# Patient Record
Sex: Female | Born: 1945
Health system: Southern US, Community
[De-identification: ages and names within clinical notes are randomized; demographics above are authoritative.]

## PROBLEM LIST (undated history)

## (undated) DIAGNOSIS — M1990 Unspecified osteoarthritis, unspecified site: Secondary | ICD-10-CM

## (undated) DIAGNOSIS — F32A Depression, unspecified: Secondary | ICD-10-CM

## (undated) DIAGNOSIS — E785 Hyperlipidemia, unspecified: Secondary | ICD-10-CM

## (undated) DIAGNOSIS — F419 Anxiety disorder, unspecified: Secondary | ICD-10-CM

## (undated) DIAGNOSIS — D509 Iron deficiency anemia, unspecified: Secondary | ICD-10-CM

## (undated) DIAGNOSIS — F329 Major depressive disorder, single episode, unspecified: Secondary | ICD-10-CM

## (undated) DIAGNOSIS — M810 Age-related osteoporosis without current pathological fracture: Secondary | ICD-10-CM

## (undated) DIAGNOSIS — I5022 Chronic systolic (congestive) heart failure: Secondary | ICD-10-CM

## (undated) DIAGNOSIS — F410 Panic disorder [episodic paroxysmal anxiety] without agoraphobia: Secondary | ICD-10-CM

## (undated) DIAGNOSIS — I2510 Atherosclerotic heart disease of native coronary artery without angina pectoris: Secondary | ICD-10-CM

## (undated) DIAGNOSIS — I5032 Chronic diastolic (congestive) heart failure: Secondary | ICD-10-CM

## (undated) DIAGNOSIS — I639 Cerebral infarction, unspecified: Secondary | ICD-10-CM

## (undated) DIAGNOSIS — E039 Hypothyroidism, unspecified: Secondary | ICD-10-CM

## (undated) DIAGNOSIS — E118 Type 2 diabetes mellitus with unspecified complications: Secondary | ICD-10-CM

## (undated) DIAGNOSIS — K589 Irritable bowel syndrome without diarrhea: Secondary | ICD-10-CM

## (undated) DIAGNOSIS — G459 Transient cerebral ischemic attack, unspecified: Secondary | ICD-10-CM

## (undated) DIAGNOSIS — I350 Nonrheumatic aortic (valve) stenosis: Secondary | ICD-10-CM

## (undated) HISTORY — PX: CHOLECYSTECTOMY: SHX55

## (undated) HISTORY — DX: Atherosclerotic heart disease of native coronary artery without angina pectoris: I25.10

## (undated) HISTORY — PX: ABDOMINAL HYSTERECTOMY: SHX81

## (undated) HISTORY — DX: Iron deficiency anemia, unspecified: D50.9

## (undated) HISTORY — DX: Type 2 diabetes mellitus with unspecified complications: E11.8

## (undated) HISTORY — DX: Nonrheumatic aortic (valve) stenosis: I35.0

## (undated) HISTORY — DX: Hyperlipidemia, unspecified: E78.5

## (undated) HISTORY — DX: Chronic systolic (congestive) heart failure: I50.22

## (undated) HISTORY — PX: COLONOSCOPY: SHX174

## (undated) HISTORY — DX: Transient cerebral ischemic attack, unspecified: G45.9

## (undated) HISTORY — PX: FRACTURE SURGERY: SHX138

---

## 1999-07-02 DIAGNOSIS — I639 Cerebral infarction, unspecified: Secondary | ICD-10-CM

## 1999-07-02 HISTORY — DX: Cerebral infarction, unspecified: I63.9

## 2001-03-08 ENCOUNTER — Emergency Department (HOSPITAL_COMMUNITY): Admission: EM | Admit: 2001-03-08 | Discharge: 2001-03-08 | Payer: Self-pay

## 2004-02-19 ENCOUNTER — Other Ambulatory Visit: Payer: Self-pay

## 2004-02-20 ENCOUNTER — Other Ambulatory Visit: Payer: Self-pay

## 2004-06-05 ENCOUNTER — Ambulatory Visit: Payer: Self-pay | Admitting: Internal Medicine

## 2004-07-01 DIAGNOSIS — G459 Transient cerebral ischemic attack, unspecified: Secondary | ICD-10-CM

## 2004-07-01 HISTORY — DX: Transient cerebral ischemic attack, unspecified: G45.9

## 2004-09-16 ENCOUNTER — Inpatient Hospital Stay (HOSPITAL_COMMUNITY): Admission: EM | Admit: 2004-09-16 | Discharge: 2004-09-18 | Payer: Self-pay | Admitting: Emergency Medicine

## 2006-04-08 ENCOUNTER — Emergency Department: Payer: Self-pay | Admitting: Unknown Physician Specialty

## 2006-04-08 ENCOUNTER — Other Ambulatory Visit: Payer: Self-pay

## 2006-07-26 ENCOUNTER — Emergency Department: Payer: Self-pay | Admitting: Emergency Medicine

## 2006-09-26 ENCOUNTER — Other Ambulatory Visit: Payer: Self-pay

## 2006-09-26 ENCOUNTER — Inpatient Hospital Stay: Payer: Self-pay | Admitting: Internal Medicine

## 2007-08-10 ENCOUNTER — Inpatient Hospital Stay: Payer: Self-pay | Admitting: Internal Medicine

## 2007-10-30 ENCOUNTER — Ambulatory Visit: Payer: Self-pay | Admitting: Internal Medicine

## 2008-03-12 ENCOUNTER — Emergency Department: Payer: Self-pay | Admitting: Emergency Medicine

## 2008-07-20 ENCOUNTER — Emergency Department: Payer: Self-pay | Admitting: Emergency Medicine

## 2008-10-05 ENCOUNTER — Inpatient Hospital Stay: Payer: Self-pay | Admitting: Internal Medicine

## 2009-12-01 ENCOUNTER — Emergency Department: Payer: Self-pay | Admitting: Emergency Medicine

## 2010-06-06 ENCOUNTER — Ambulatory Visit: Payer: Self-pay | Admitting: Internal Medicine

## 2010-11-18 ENCOUNTER — Observation Stay: Payer: Self-pay | Admitting: Internal Medicine

## 2011-06-18 ENCOUNTER — Emergency Department: Payer: Self-pay | Admitting: Emergency Medicine

## 2012-03-01 ENCOUNTER — Inpatient Hospital Stay: Payer: Self-pay | Admitting: Student

## 2012-03-01 LAB — CBC
HCT: 39.1 % (ref 35.0–47.0)
HGB: 12.8 g/dL (ref 12.0–16.0)
MCH: 27.7 pg (ref 26.0–34.0)
MCHC: 32.7 g/dL (ref 32.0–36.0)
MCV: 85 fL (ref 80–100)
Platelet: 138 10*3/uL — ABNORMAL LOW (ref 150–440)
RBC: 4.61 10*6/uL (ref 3.80–5.20)
RDW: 15 % — ABNORMAL HIGH (ref 11.5–14.5)
WBC: 4.9 10*3/uL (ref 3.6–11.0)

## 2012-03-01 LAB — COMPREHENSIVE METABOLIC PANEL
Albumin: 3.4 g/dL (ref 3.4–5.0)
Alkaline Phosphatase: 119 U/L (ref 50–136)
Anion Gap: 5 — ABNORMAL LOW (ref 7–16)
BUN: 7 mg/dL (ref 7–18)
Bilirubin,Total: 0.2 mg/dL (ref 0.2–1.0)
Calcium, Total: 9.3 mg/dL (ref 8.5–10.1)
Chloride: 105 mmol/L (ref 98–107)
Co2: 27 mmol/L (ref 21–32)
Creatinine: 0.68 mg/dL (ref 0.60–1.30)
EGFR (African American): >60
EGFR (Non-African Amer.): >60
Glucose: 230 mg/dL — ABNORMAL HIGH (ref 65–99)
Osmolality: 279 (ref 275–301)
Potassium: 4 mmol/L (ref 3.5–5.1)
SGOT(AST): 29 U/L (ref 15–37)
SGPT (ALT): 23 U/L (ref 12–78)
Sodium: 137 mmol/L (ref 136–145)
Total Protein: 8.3 g/dL — ABNORMAL HIGH (ref 6.4–8.2)

## 2012-03-01 LAB — URINALYSIS, COMPLETE
Glucose,UR: 150 mg/dL (ref 0–75)
Ketone: NEGATIVE
Nitrite: NEGATIVE
WBC UR: 9 /HPF (ref 0–5)

## 2012-03-01 LAB — PROTIME-INR
INR: 1.1
Prothrombin Time: 14.1 secs (ref 11.5–14.7)

## 2012-03-01 LAB — TROPONIN I: Troponin-I: <0.02 ng/mL

## 2012-03-02 LAB — COMPREHENSIVE METABOLIC PANEL
Albumin: 3.2 g/dL — ABNORMAL LOW (ref 3.4–5.0)
Anion Gap: 7 (ref 7–16)
BUN: 9 mg/dL (ref 7–18)
Calcium, Total: 9.1 mg/dL (ref 8.5–10.1)
Chloride: 105 mmol/L (ref 98–107)
EGFR (African American): >60
Glucose: 130 mg/dL — ABNORMAL HIGH (ref 65–99)
Potassium: 3.6 mmol/L (ref 3.5–5.1)
SGOT(AST): 26 U/L (ref 15–37)
SGPT (ALT): 21 U/L (ref 12–78)
Total Protein: 7.7 g/dL (ref 6.4–8.2)

## 2012-03-02 LAB — CBC WITH DIFFERENTIAL/PLATELET
Basophil #: 0 10*3/uL (ref 0.0–0.1)
Basophil %: 0.8 %
Eosinophil #: 0.2 10*3/uL (ref 0.0–0.7)
HCT: 37.1 % (ref 35.0–47.0)
Lymphocyte #: 2.1 10*3/uL (ref 1.0–3.6)
MCHC: 33.4 g/dL (ref 32.0–36.0)
MCV: 85 fL (ref 80–100)
Monocyte %: 7.5 %
Neutrophil #: 2.6 10*3/uL (ref 1.4–6.5)
Neutrophil %: 49.2 %
RDW: 15 % — ABNORMAL HIGH (ref 11.5–14.5)

## 2012-03-02 LAB — LIPID PANEL
HDL Cholesterol: 47 mg/dL (ref 40–60)
Ldl Cholesterol, Calc: 46 mg/dL (ref 0–100)
VLDL Cholesterol, Calc: 39 mg/dL (ref 5–40)

## 2013-06-09 ENCOUNTER — Emergency Department: Payer: Self-pay

## 2013-06-09 LAB — CBC
HCT: 41.7 % (ref 35.0–47.0)
HGB: 13.7 g/dL (ref 12.0–16.0)
MCV: 84 fL (ref 80–100)
Platelet: 129 10*3/uL — ABNORMAL LOW (ref 150–440)
RBC: 4.96 10*6/uL (ref 3.80–5.20)
RDW: 16.4 % — ABNORMAL HIGH (ref 11.5–14.5)

## 2013-06-09 LAB — BASIC METABOLIC PANEL
Anion Gap: 7 (ref 7–16)
BUN: 5 mg/dL — ABNORMAL LOW (ref 7–18)
Calcium, Total: 9.2 mg/dL (ref 8.5–10.1)
Chloride: 102 mmol/L (ref 98–107)
Co2: 28 mmol/L (ref 21–32)
EGFR (African American): >60
Glucose: 222 mg/dL — ABNORMAL HIGH (ref 65–99)
Osmolality: 278 (ref 275–301)
Sodium: 137 mmol/L (ref 136–145)

## 2013-06-09 LAB — URINALYSIS, COMPLETE
Bilirubin,UR: NEGATIVE
Blood: NEGATIVE
Glucose,UR: 50 mg/dL (ref 0–75)
Leukocyte Esterase: NEGATIVE
Ph: 8 (ref 4.5–8.0)
Specific Gravity: 1.004 (ref 1.003–1.030)
Squamous Epithelial: 1
WBC UR: 2 /HPF (ref 0–5)

## 2013-06-09 LAB — TROPONIN I: Troponin-I: <0.02 ng/mL

## 2014-02-11 ENCOUNTER — Ambulatory Visit: Payer: Self-pay | Admitting: Internal Medicine

## 2014-10-18 NOTE — H&P (Signed)
PATIENT NAME:  Jenny [name], Jenny [name] MR#:  [id number] DATE OF BIRTH:  Aug 11, 1945  DATE OF ADMISSION:  03/01/2012  PRIMARY CARE PHYSICIAN: Corky Downs, MD  REFERRING PHYSICIAN: Glennie Isle, MD  CHIEF COMPLAINT: Right side weakness.   HISTORY OF PRESENT ILLNESS: This is a 69 year old female with significant past medical history of cerebrovascular accident x2 on aspirin 325 mg and Plavix at home, history of diabetes, hypothyroidism, hypertension, and hyperlipidemia who presents with complaints of right side weakness. The patient reports her symptoms developed around 6 o'clock p.m. where she started to notice right upper and lower extremity weakness and numbness and some mild slurred speech. The patient denies any confusion, altered mental status, chest pain, shortness of breath, palpitations, or visual changes. Upon presentation to the ED, the patient had a CT of the brain which did not show any acute abnormality but did show evidence of old CVA in the right cerebellum. The patient reports her symptoms have significantly improved and at the time of my examination, around 1040, the patient did not have symptoms and symptoms were totally resolved, speech was clear and motor strength regained back. The patient reports she did not have any recent echo or work-up for her cerebrovascular accident. The patient reports she took Plavix this morning. The patient was given 325 mg of aspirin in the ED. The patient reports she has been compliant with her medication.   PAST MEDICAL HISTORY:  1. Cardiac arrhythmia.  2. Mitral vault prolapse.  3. Anxiety/depression.  4. Type 2 diabetes, on insulin.  5. Hypothyroidism.  6. Hypertension.  7. Hyperlipidemia.  8. History of cerebrovascular accident x2.   MEDICATIONS:  1. Alprazolam 1 mg two times a day. 2. Plavix 75 mg daily. 3. Diltiazem 240 mg daily.  4. Glyburide 4 mg daily.  5. Hydroxyzine 25 mg at bedtime.  6. Januvia 100 mg daily. 7. Lantus 53 units in  the a.m., 50 units in the p.m.  8. Levothroid 88 mcg daily.  9. Metformin 500 mg two times a day.  10. Simvastatin 40 mg at bedtime. 11. Zoloft 100 mg daily.   ALLERGIES: Iodine and IV dye.   SOCIAL HISTORY: No history of alcohol or tobacco abuse.   FAMILY HISTORY: Significant for coronary artery disease, diabetes, hypertension, and CVA.   REVIEW OF SYSTEMS: CONSTITUTIONAL: The patient denies fever, fatigue, or generalized weakness. EYES: Denies blurry vision, double vision, or pain. ENT: Denies tinnitus, ear pain, or hearing loss. RESPIRATORY: Denies cough, wheezing, hemoptysis, or dyspnea. CARDIOVASCULAR: Denies chest pain, orthopnea, edema, arrhythmias, palpitations, or syncope. GASTROINTESTINAL: Denies nausea, vomiting, diarrhea, abdominal pain, or hematemesis. GENITOURINARY: Denies dysuria, hematuria, or renal colic. ENDOCRINE: Denies polyuria, polydipsia, heat or cold intolerance. Has history of hypothyroidism. HEMATOLOGY: Denies anemia, easy bruising, or bleeding diathesis. INTEGUMENTARY: Denies any acne. Has some mild rash in her left lower extremity, chronic. MUSCULOSKELETAL: Complains of right side weakness, currently resolved. Denies any history of arthritis or gout. NEURO: Has right side weakness, currently resolved, as well as numbness has resolved. Reports history of CVA. Had complaints of headache. Denies any tremors or vertigo. PSYCH: Has history of anxiety and depression. Denies any alcohol or substance abuse.   PHYSICAL EXAMINATION:   VITAL SIGNS: Temperature 97.2, pulse 71, respiratory rate 18, blood pressure 138/66, and saturating 96% on room air.   GENERAL: Well-nourished female who is comfortable in bed, in no apparent distress.   HEENT: Head atraumatic, normocephalic. Pupils are equal and reactive to light. Pink conjunctivae. Anicteric sclerae. Moist oral mucosa.  NECK: Supple. No thyromegaly. No JVD.   CHEST: Good air entry bilaterally. No wheezing, rales, or rhonchi.    CARDIOVASCULAR: S1 and S2 heard. No rubs, murmurs, or gallops.   ABDOMEN: Soft, nontender, and nondistended. Bowel sounds present.   EXTREMITIES: No edema. No clubbing. No cyanosis.  NEURO: Cranial nerves II through XII grossly intact. Motor five out of five in all extremities. Sensation symmetrical.   PSYCHIATRIC: Appropriate affect, awake and alert x3. Intact judgment and insight.   PERTINENT LABS/STUDIES: Glucose 230, BUN 7, creatinine 0.68, sodium 137, potassium 4, chloride 105, CO2 27, total protein 8.3, albumin 3.4, and total bilirubin 0.2. Troponin less than 0.02. White blood cells 4.9, hemoglobin 12.8, hematocrit 39.1, and platelets 138.   Urinalysis negative.   CT of brain shows old infarct in the right cerebellum without acute abnormalities.   ASSESSMENT AND PLAN:  1. Transient ischemic attack. The patient is already on 325 mg of aspirin and 75 mg of Plavix. She denies any history of atrial fibrillation. We will admit the patient for further work-up. We will have her on a telemonitor to monitor for any arrhythmias. We will obtain 2-D echo. We will obtain MRI of the brain with and without contrast and bilateral carotid Doppler's. The patient was given 325 mg of aspirin in the ED and 80 mg of statin and 80 mg of simvastatin. We will check lipid panel in the a.m.  2. History of CVA. We will continue the patient on aspirin, Plavix, and statin.  3. Hypertension. Will we continue the patient on Cardizem.  4. Hyperlipidemia. We will check lipid panel in the a.m. We will continue with Cardizem.  5. Diabetes mellitus. We will hold oral hypoglycemic agent. We will continue the patient on insulin sliding scale before meals and will start her at a lower dose insulin than home dose which can be increased gradually depending on her p.o. intake. The patient does not have any dysphagia and can be resumed on cardiac and diabetic diet.  6. Hypothyroidism. Continue with Synthroid.  7. Deep vein  thrombosis prophylaxis. Subcutaneous heparin.  8. GI prophylaxis. Protonix.   CODE STATUS: FULL CODE.   TOTAL TIME SPENT ON ADMISSION AND PATIENT CARE: 55 minutes.  ____________________________ Starleen Arms, MD dse:slb D: 03/01/2012 23:07:30 ET T: 03/02/2012 11:15:36 ET JOB#: [id number]  cc: Starleen Arms, MD, <Dictator> Corky Downs, MD Kendall Arnell Teena Irani MD ELECTRONICALLY SIGNED 03/03/2012 0:27

## 2014-10-18 NOTE — Discharge Summary (Signed)
PATIENT NAME:  Jenny [name], PITTINGER MR#:  [id number] DATE OF BIRTH:  22-Jun-1946  DATE OF ADMISSION:  03/01/2012 DATE OF DISCHARGE:  03/03/2012  PRIMARY CARE PHYSICIAN: Corky Downs, MD  CHIEF COMPLAINT: Right-sided weakness.   DISCHARGE DIAGNOSES:  1. Possible transient ischemic attack.  2. History of cardiac arrhythmia.  3. History of mitral valve prolapse. 4. Anxiety/depression.  5. Type 2 diabetes, on insulin.  6. Hypothyroidism.  7. Hypertension.  8. Hyperlipidemia.  9. History of cerebrovascular accident and transient ischemic attack.   DISCHARGE MEDICATIONS:  1. Metformin 500 mg 1 tab 2 times a day. 2. Levothyroxine 88 mcg daily. 3. Glimepiride 4 mg 1 tab daily.  4. Alprazolam 1 mg 2 times a day.  5. Hydroxyzine 25 mg at bedtime. 6. Zoloft 100 mg daily. 7. Januvia 100 mg daily.  8. Diltiazem 240 mg extended-release one cap daily.  9. Simvastatin 40 mg daily.  10. Plavix 75 mg daily.  11. Lantus 50 units in the a.m. and 40 units in the p.m. 12. Aspirin 325 mg 1 tab daily.   DIET: Low sodium, ADA diabetic, low-fat, low-cholesterol diet.   ACTIVITY: As tolerated.   FOLLOWUP: Follow up with her primary care physician within 1 to 2 weeks.  HISTORY OF PRESENT ILLNESS: For full details of history and physical please see dictation of March 01, 2012 by Dr. Randol Kern; but briefly this is a 69 year old female with history of CVA in the past on aspirin and Plavix, diabetes, hypothyroidism, hypertension who presented with right upper extremity weakness as well as right lower extremity weakness and some mild slurred speech. The symptoms lasted for several hours. The patient had a CT of the head in the ED which was negative for acute abnormalities but the patient was admitted to hospitalist service for further evaluation and management.   SIGNIFICANT LABS AND/IMAGING: Creatinine on arrival 0.68, sodium 137, glucose 230.  LDL within goal 46. LFTs on arrival: Total protein 8.3, otherwise  within normal limits. Troponin was negative x1. WBC 4.9, hemoglobin 12.8, hematocrit 39.1, platelets 138,000. INR 1. Urinalysis not suggestive of infection. Echocardiogram: Normal ejection fraction with mild diastolic dysfunction, mild MR and TR. CT of head without contrast showing old small stable right cerebellar infarct, no acute abnormality. MRI of the brain on March 03, 2012 showing chronic ischemia, no evidence of acute ischemia. Ultrasound of carotid showing mild atherosclerotic disease, no evidence of hemodynamically significant stenosis.   HOSPITAL COURSE: The patient was admitted to the hospitalist service. She had her aspirin and Plavix resumed. She had resolved neurological symptoms, and her symptoms did not come back. The tele did not show any significant arrhythmia. Echo was noted as above and did not have any significant valvular abnormalities, and her carotid ultrasound did not have any significant stenosis but it did show some plaque. The patient's cholesterol panel was checked and has LDL within goal and is on statin already as well as her aspirin and Plavix. An MRI of the brain did not show any acute stroke, and patient was seen by PT and the recommendation was to be discharged to home and the patient does not want to be on Coumadin at this point. Further risk modifications and the decision of whether to anticoagulate her with perhaps another agent if patient wants to be anticoagulated fully will be deferred to her primary physician upon discharge. At this point, as her symptoms have resolved, she will be discharged with outpatient followup with her primary care physician.  TOTAL TIME SPENT: 35 minutes.   CODE STATUS: PATIENT IS FULL CODE.     ____________________________ Krystal Eaton, MD sa:vtd D: 03/03/2012 16:26:47 ET T: 03/05/2012 12:17:01 ET JOB#: [id number]  cc: Krystal Eaton, MD, <Dictator> Corky Downs, MD Marcelle Smiling [facility] MD ELECTRONICALLY SIGNED  03/11/2012 0:21

## 2014-10-22 ENCOUNTER — Inpatient Hospital Stay
Admit: 2014-10-22 | Disposition: A | Discharge status: Skilled Nursing Facility | Payer: Self-pay | Attending: Orthopedic Surgery | Admitting: Orthopedic Surgery

## 2014-10-22 LAB — URINALYSIS, COMPLETE
Bilirubin,UR: NEGATIVE
Blood: NEGATIVE
Glucose,UR: 150 mg/dL (ref 0–75)
Ketone: NEGATIVE
Leukocyte Esterase: NEGATIVE
Nitrite: NEGATIVE
PROTEIN: NEGATIVE
Ph: 9 (ref 4.5–8.0)
Specific Gravity: 1.003 (ref 1.003–1.030)
Squamous Epithelial: NONE SEEN

## 2014-10-22 LAB — CBC
HCT: 39.6 % (ref 35.0–47.0)
HGB: 12.9 g/dL (ref 12.0–16.0)
MCH: 27.7 pg (ref 26.0–34.0)
MCHC: 32.6 g/dL (ref 32.0–36.0)
MCV: 85 fL (ref 80–100)
Platelet: 100 10*3/uL — ABNORMAL LOW (ref 150–440)
RBC: 4.65 10*6/uL (ref 3.80–5.20)
RDW: 16.1 % — ABNORMAL HIGH (ref 11.5–14.5)
WBC: 4.8 10*3/uL (ref 3.6–11.0)

## 2014-10-22 LAB — BASIC METABOLIC PANEL
ANION GAP: 10 (ref 7–16)
BUN: 6 mg/dL
CO2: 24 mmol/L
Calcium, Total: 8.9 mg/dL
Chloride: 100 mmol/L — ABNORMAL LOW
Creatinine: 0.68 mg/dL
EGFR (African American): >60
EGFR (Non-African Amer.): >60
Glucose: 333 mg/dL — ABNORMAL HIGH
POTASSIUM: 3.7 mmol/L
Sodium: 134 mmol/L — ABNORMAL LOW

## 2014-10-22 LAB — PROTIME-INR
INR: 1.1
INR: 1.2
Prothrombin Time: 14.7 secs
Prothrombin Time: 15.3 secs — ABNORMAL HIGH

## 2014-10-22 LAB — APTT: ACTIVATED PTT: 34.3 s (ref 23.6–35.9)

## 2014-10-23 LAB — CBC WITH DIFFERENTIAL/PLATELET
BASOS ABS: 0 10*3/uL (ref 0.0–0.1)
BASOS PCT: 0.3 %
EOS ABS: 0.2 10*3/uL (ref 0.0–0.7)
Eosinophil %: 3.2 %
HCT: 37.7 % (ref 35.0–47.0)
HGB: 12.2 g/dL (ref 12.0–16.0)
Lymphocyte #: 1.3 10*3/uL (ref 1.0–3.6)
Lymphocyte %: 22 %
MCH: 27.6 pg (ref 26.0–34.0)
MCHC: 32.5 g/dL (ref 32.0–36.0)
MCV: 85 fL (ref 80–100)
Monocyte #: 0.5 x10 3/mm (ref 0.2–0.9)
Monocyte %: 9 %
NEUTROS ABS: 3.8 10*3/uL (ref 1.4–6.5)
NEUTROS PCT: 65.5 %
Platelet: 102 10*3/uL — ABNORMAL LOW (ref 150–440)
RBC: 4.43 10*6/uL (ref 3.80–5.20)
RDW: 16 % — AB (ref 11.5–14.5)
WBC: 5.9 10*3/uL (ref 3.6–11.0)

## 2014-10-23 LAB — BASIC METABOLIC PANEL
ANION GAP: 6 — AB (ref 7–16)
CHLORIDE: 109 mmol/L
Calcium, Total: 8.9 mg/dL
Co2: 25 mmol/L
Creatinine: 0.48 mg/dL
EGFR (African American): >60
EGFR (Non-African Amer.): >60
Glucose: 121 mg/dL — ABNORMAL HIGH
Potassium: 3.5 mmol/L
Sodium: 140 mmol/L

## 2014-10-23 LAB — HEMOGLOBIN A1C: Hemoglobin A1C: 8.6 % — ABNORMAL HIGH

## 2014-10-24 LAB — BASIC METABOLIC PANEL
ANION GAP: 5 — AB (ref 7–16)
BUN: 11 mg/dL
CHLORIDE: 105 mmol/L
CO2: 23 mmol/L
Calcium, Total: 8.6 mg/dL — ABNORMAL LOW
Creatinine: 0.6 mg/dL
EGFR (Non-African Amer.): >60
GLUCOSE: 293 mg/dL — AB
POTASSIUM: 4.4 mmol/L
Sodium: 134 mmol/L — ABNORMAL LOW

## 2014-10-24 LAB — CBC WITH DIFFERENTIAL/PLATELET
Basophil #: 0 10*3/uL (ref 0.0–0.1)
Basophil %: 0.1 %
EOS PCT: 0 %
Eosinophil #: 0 10*3/uL (ref 0.0–0.7)
HCT: 34.4 % — AB (ref 35.0–47.0)
HGB: 11.4 g/dL — ABNORMAL LOW (ref 12.0–16.0)
LYMPHS PCT: 13 %
Lymphocyte #: 0.8 10*3/uL — ABNORMAL LOW (ref 1.0–3.6)
MCH: 28.1 pg (ref 26.0–34.0)
MCHC: 33.1 g/dL (ref 32.0–36.0)
MCV: 85 fL (ref 80–100)
MONOS PCT: 4.4 %
Monocyte #: 0.3 x10 3/mm (ref 0.2–0.9)
Neutrophil #: 4.8 10*3/uL (ref 1.4–6.5)
Neutrophil %: 82.5 %
PLATELETS: 89 10*3/uL — AB (ref 150–440)
RBC: 4.04 10*6/uL (ref 3.80–5.20)
RDW: 16.2 % — ABNORMAL HIGH (ref 11.5–14.5)
WBC: 5.8 10*3/uL (ref 3.6–11.0)

## 2014-10-25 LAB — HEMOGLOBIN: HGB: 10.8 g/dL — ABNORMAL LOW (ref 12.0–16.0)

## 2014-10-30 NOTE — Discharge Summary (Signed)
PATIENT NAME:  Jenny [name], Jenny [name] MR#:  [id number] DATE OF BIRTH:  06-22-1946  DATE OF ADMISSION:  10/22/2014 DATE OF DISCHARGE:  10/25/2014  HISTORY OF PRESENT ILLNESS: Jenny [name] is a 69 year old diabetic female with a history of CVA x2 without neurologic deficits who fell off a stepladder hanging curtains on the day of admission. She injured her left ankle. She was brought to the [facility] Emergency Department where she was diagnosed with a trimalleolar ankle fracture without dislocation. The patient was admitted to the orthopedic surgery service for evaluation and treatment of her left ankle fracture.   PAST MEDICAL HISTORY: Includes panic attacks, mitral valve prolapse, arrhythmias, diabetes, hypothyroidism, hypertension, depression, insulin-dependent diabetes, history of stroke, and she is status post cholecystectomy and total hysterectomy.   ALLERGIES: IVP DYE, IODINE AND SEAFOOD.   MEDICATIONS: Home medications include Januvia 100 mg daily, diltiazem 240 mg 1 tablet daily, Plavix 75 mg daily, aspirin 325 mg at bedtime, glimepiride 4 mg daily, hydroxyzine hydrochloride 25 mg daily at bedtime, Lantus 90 units subcutaneous 50 units once a day in the morning and 40 units every evening,  levothyroxine 88 mcg 1 tablet daily, metformin 500 mg 1 tablet b.i.d., alprazolam 0.5 mg 1 tablet a day in the morning and 2 in the evening, simvastatin 40 mg 1/2 tablet daily at bedtime, multivitamin, vitamin D3 400 international units 2 tablets daily, sertraline 100 mg b.i.d., Afrin 0.05% nasal spray to each nostril daily.  FAMILY HISTORY: The patient lives with her husband independently at home. 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   HOSPITAL COURSE: The patient was admitted on October 22, 2014. She was seen by the hospitalist service and cleared for surgery. On April 24th, she underwent open reduction internal fixation of her left trimalleolar ankle fracture. Both the medial and  lateral malleoli were internally fixed. The posterior malleolus malleolar fragment was small and nondisplaced. It did not require surgical fixation. She was placed in an AO splint. The patient requested not to have a Foley catheter and underwent general anesthesia for this case. Postoperatively, she returned to the orthopedic floor. She had strict elevation of her left lower extremity. She had neurovascular checks. Physical therapy began on October 24, 2014. The patient was able to get out of bed on postop day number 1. She was encouraged to use her incentive spirometer. She completed 24 hours of postop antibiotics and was on Plavix and aspirin for DVT prophylaxis. She is on Plavix and aspirin at home. The patient continued to improve and on postop day number 2 the pain was controlled on oral narcotics. She continued to elevate her lower extremity and remained neurovascularly intact. Given the patient's clinical improvement, she was cleared for discharge.   DISCHARGE INSTRUCTIONS: The patient is pending discharge to rehab. She is awaiting [employer] approval for rehab. She has done well orthopedically and is stable postop. She will continue in all of her home medications with the addition of oxycodone 5 mg tablets to take 1 to 2 tablets every 4 to 6 hours p.r.n. for pain. She will continue strict elevation of the left lower extremity. She will be touchdown, toe-touch weight-bearing, only on the left lower extremity with the use of a walker. She will follow up with me in 10 days for wound check, staple removal and casting. She will remain on Plavix and aspirin for DVT prophylaxis.  ____________________________ Kathreen Devoid, MD klk:sb D: 10/25/2014 11:05:28 ET T: 10/25/2014 11:21:30 ET JOB#:  [id number]  cc: Kathreen Devoid, MD, <Dictator> Kathreen Devoid MD ELECTRONICALLY SIGNED 10/26/2014 13:23

## 2014-10-30 NOTE — Consult Note (Signed)
PATIENT NAME:  Jenny [name], Jenny [name] MR#:  [id number] DATE OF BIRTH:  January 23, 1946  DATE OF CONSULTATION:  10/22/2014  CONSULTING PHYSICIAN:  Ramonita Lab, MD  PRIMARY CARE PHYSICIAN: Corky Downs, MD   ATTENDING PHYSICIAN: Kathreen Devoid, MD  REASON FOR CONSULTATION: Preop clearance.   HISTORY OF PRESENT ILLNESS: The patient is a 69 year old Caucasian female with a history of diabetes mellitus, hypothyroidism, chronic atrial fibrillation, and hypertension, who sustained a fall. Came into the ED and diagnosed with a left ankle trimalleolar fracture. The patient was evaluated by Dr. Martha Clan and scheduled for surgery in the a.m. for open reduction and internal fixation.   PAST MEDICAL HISTORY: Chronic atrial fibrillation, mitral valve prolapse, anxiety, depression, type 2 diabetes mellitus, hypothyroidism, hypertension, hyperlipidemia, history of CVA x 2.   PAST SURGICAL HISTORY: Cholecystectomy, hysterectomy.   ALLERGIES: IODINE AND IV DYE.   PSYCHOSOCIAL HISTORY: Denies alcohol, smoking, or illegal drugs. Lives with husband.   FAMILY HISTORY: History of coronary artery disease, diabetes, hypertension, and stroke runs in her family.   REVIEW OF SYSTEMS:  CONSTITUTIONAL: Denies any fever or fatigue.  EYES: Denies blurry vision or double vision.  EARS, NOSE, AND THROAT: Denies epistaxis or discharge.  RESPIRATORY: Denies cough or COPD.  CARDIOVASCULAR: No chest pain or palpitations.  GASTROINTESTINAL: Denies nausea, vomiting, diarrhea, or abdominal pain.  GENITOURINARY: No dysuria or hematuria.   GYNECOLOGIC AND BREASTS: Denies any breast masses. Status post hysterectomy.  ENDOCRINE: Has hypothyroidism and diabetes mellitus. Denies any polyuria or nocturia.  INTEGUMENTARY: No acne, rash, or lesions.  MUSCULOSKELETAL: Complaining of ankle pain. Denies any gout.  NEUROLOGIC: Has a history of stroke in the past. No TIA.   PSYCHIATRIC: No ADD or OCD.   HOME MEDICATIONS: Alprazolam 0.5  mg 1 tablet p.o. in the a.m., 2 tablets at bedtime, aspirin 325 mg once daily, Plavix 75 mg once daily, diltiazem 240 mg extended release 1 capsule p.o. once daily, glimepiride 4 mg p.o. once a day, hydroxyzine/hydrochloride 25 mg 1 tablet p.o. once daily, Januvia 100 mg 1 tablet p.o. once daily, Lantus 50 units subcutaneously once a day in a.m., 40 units every night, levothyroxine 88 mcg p.o. once daily, metformin 500 mg 1 tablet p.o. 2 times a day, One A Day multivitamin once daily, Zoloft 100 mg p.o. 2 times a day, simvastatin 40 mg 1/2 tablet once daily, vitamin D3 at 400 international units 2 tablets p.o. once daily.   PHYSICAL EXAMINATION:  VITAL SIGNS: Temperature 98.3, pulse 70, respirations 18, blood pressure 112/62, pulse oximetry of 96%.  GENERAL APPEARANCE: Not in acute distress. Moderately built and nourished.  HEENT: Normocephalic, atraumatic. Pupils are equal, reactive to light and accommodation. No scleral icterus. No conjunctival injection. No sinus tenderness. No postnasal drip. Moist mucous membranes. NECK: Supple. No JVD. No thyromegaly. Range of motion is intact.   LUNGS: Clear to auscultation bilaterally. No accessory muscle use. There is no anterior chest wall tenderness on palpation.   CARDIOVASCULAR: Irregularly irregular. Positive murmur.  GASTROINTESTINAL: Soft. Bowel sounds are positive in all 4 quadrants. Nontender, nondistended. No masses felt.  NEUROLOGIC: Awake, alert, oriented x 3. Cranial nerves II through XII are grossly intact. Motor and sensory are intact. Reflexes are 2+.  EXTREMITIES: Left ankle is in a cast after closed reduction by the ED physician. Peripheral pulses are 2+. PSYCHIATRIC: Normal mood and affect.   LABORATORY AND IMAGING STUDIES: Chest x-ray, portable view, is pending. A 12-lead EKG is pending. CAT scan of the head: No acute abnormalities.  Left ankle trimalleolar fracture. WBC 4.8, hemoglobin 12.9, hematocrit 39.6, platelets 800,000. PT 15.3, INR  1, PTT 34.3. Glucose 333, BUN 6, creatinine 0.68, sodium 134, potassium 3.7, chloride 100. CO2 and anion gap are normal. GFR greater than 60. Calcium 8.9.   ASSESSMENT AND PLAN: Jenny [name] is admitted to Dr. Samuel Germany service after she sustained a fall and diagnosed with left trimalleolar fracture of the ankle. The patient is scheduled for open reduction and internal fixation in the morning. Medical consult is placed for preoperative clearance and management of her diabetes and hypertension. The patient denies any chest pain or shortness of breath at this time. Denies any heart attacks or congestive heart failure. She has chronic history of atrial fibrillation and mitral valve prolapse, but she does not use any antibiotics prior to procedures.  1.  Left trimalleolar fracture: The patient is under Dr. Samuel Germany service. Pain management per Dr. Martha Clan.  2.  Insulin-requiring diabetes mellitus: The patient takes Lantus at home. She was scheduled for surgery tomorrow morning. She will be nothing by mouth after midnight. We will keep her on sliding scale insulin and also we will provide her Lantus 10 units subcutaneously for basal coverage. We will check hemoglobin A1c.  3.  Hypertension: We will resume her home medications including Cardizem and up titrate medication on an as needed basis.  4.  Chronic history of atrial fibrillation, rate controlled: Continue Cardizem. The patient is on aspirin at home and Plavix at home. We will hold both aspirin and Plavix in view of surgery in the morning, which can be resumed after surgery.  5.  Hypothyroidism: Continue Synthroid.  6.  Preoperative clearance: At this point of time, chest x-ray and 12-lead EKG are pending. After reviewing both of them, the patient will be most likely optimized for surgery in the morning. 7.  CODE STATUS: She is FULL CODE. Husband is the medical power of attorney.  8.  We will provide gastrointestinal prophylaxis and deep vein  thrombosis prophylaxis with sequential compression devices.   Plan of care was discussed with the patient and her husband.  TOTAL TIME SPENT: 45 minutes.    ____________________________ Ramonita Lab, MD ag:TT D: 10/22/2014 21:09:12 ET T: 10/23/2014 01:29:11 ET JOB#: [id number]  cc: Ramonita Lab, MD, <Dictator> Kathreen Devoid, MD Corky Downs, MD Ramonita Lab MD ELECTRONICALLY SIGNED 10/23/2014 13:51

## 2014-10-30 NOTE — Op Note (Signed)
PATIENT NAME:  Jenny [name], Jenny [name] MR#:  [id number] DATE OF BIRTH:  09-Aug-1945  DATE OF PROCEDURE:  10/23/2014.  PREOPERATIVE DIAGNOSIS:  Left trimalleolar fracture with lateral talar subluxation.  POSTOPERATIVE DIAGNOSIS:  Left trimalleolar fracture with lateral talar subluxation.  SURGEON:  Dr. Juanell Fairly.   ANESTHESIA:  General.   ESTIMATED BLOOD LOSS:  Minimal.   TOURNIQUET TIME:  84 minutes.   COMPLICATIONS:  None.   IMPLANTS:  Synthes 7-hole one-third tubular plate for lateral fixation and a single 46 mm 4.0 cannulated screw for medial fixation.   INDICATION FOR PROCEDURE:  The patient is a 69 year old female who fell off a stepladder at home while hanging curtains.  She sustained a trimalleolar ankle fracture with lateral subluxation of the talus.  Given her high level of functioning at baseline, I recommended surgical fixation for her fracture.  The patient agreed to the procedure.  The risks and benefits of the procedure were explained to the patient.   PROCEDURE NOTE:  The patient was brought to the operating room.  She underwent general anesthesia.  A bump was placed under her left hip and a tourniquet applied to the left thigh.  She was prepped and draped in a sterile fashion.  A timeout was performed to verify the patient's name, date of birth, medical record number, correct site of surgery, and correct procedure to be performed.  It was also used to verify the patient had received antibiotics and all appropriate instruments, implants, and radiographic studies were available in the room.  Once all in attendance were in agreement, the case began.   The patient had the left leg Esmarch'd and the tourniquet was inflated to 275 mmHg.  It was inflated for 84 minutes.  A lateral incision was then made in line with the fibula.  The superficial peroneal nerve was seen and identified and carefully retracted out of the surgical field.  The fracture was then easily identified.  A small Key  elevator was used to clear the periosteum from the fracture edges.  There was comminution at the fracture site preventing positioning of a lag screw.  The fracture was reduced and held into position with a fracture clamp.  It was then K-wired with 2 threaded K-wires to hold position during plating.  A 7-hole one-third tubular plate was contoured to the lateral malleolus.  It was placed in position alongside the fracture.  The fracture was so low that only 2 holes were available for fixation below the fracture site.  The position of the plate was confirmed on AP and lateral C-arm imaging.  Once adequate position was achieved, a single, distal, fully threaded, cannulated screw was placed in the lateral malleolus.  Then a second screw was placed, which was a bicortical screw 12 mm in length, above the fracture.  Again, the plate position was confirmed on AP and lateral C-arm images.  The plate was found to have excellent position.  The distalmost screw was then drilled and a 12 mm locking screw was placed below the fracture.  Two additional bicortical screws were placed above the fracture, both 12 mm in length.  The proximalmost screw was left empty to avoid any further soft tissue dissection and a hole was left empty over the fracture site itself.  Final C-arm images of the lateral malleolus fracture reduction were then taken.  Attention was then turned to the medial side.   C-arm images of the location of the medial malleolus were taken.  The fracture site  was marked with a surgical marker and the proposed incision was drawn also with a surgical marker.  A vertical incision was made over the medial malleolus.  Soft tissues were carefully dissected using Metzenbaum scissors and pick-up.  Care was taken to avoid the saphenous vein and neurovascular structures.  The fracture of the medial malleolus was extremely low.  A small fragment of the medial malleolus had displaced.  This was reduced and held into position with  a dental pick while a threaded K-wire was passed across the medial malleolar fragment and into the distal tibia.  It was measured to be 46 mm in length.  This was then overdrilled with a cannulated drill for the 4.0 cannulated screw set.  A 46 mm cannulated screw with a washer was then advanced across the fracture site by hand and hand tightened.  Final images of the bimalleolar ankle fracture reduction and hardware placement were taken on the AP, lateral, and mortise views.  The mortise was symmetric and there was no widening of the syndesmosis.  The fractures were in near-anatomic position.  There was slight step-off of the medial malleolus but given the small fracture size, it was felt that further reduction attempts may cause comminution of the distal fragment.  Both wounds were copiously irrigated.  The medial wound was closed with 2-0 Vicryl and skin staples were used to approximate the skin.  Laterally, the deep soft tissue was closed with 0 Vicryl and the subcutaneous tissues closed with 2-0 Vicryl taking care to avoid injury to the superficial peroneal nerve, and then skin staples applied to approximate the lateral incision.  A dry sterile dressing was applied along with an AO splint.  I was scrubbed and present for the entire case, and all sharp and instrument counts were correct at the conclusion of the case.  The patient was brought to the PACU in stable condition.    ____________________________ Kathreen Devoid, MD [id number] D: 10/23/2014 16:56:00 ET T: 10/23/2014 18:49:06 ET JOB#: [id number]  cc: Kathreen Devoid, MD, <Dictator> Kathreen Devoid MD ELECTRONICALLY SIGNED 10/26/2014 13:23

## 2014-10-30 NOTE — H&P (Signed)
Subjective/Chief Complaint Left trimalleolar fracture   History of Present Illness Patient is a 69 year old diabetic female with a history of CVA x 2 without residual neurologic deficits, who fell off a step ladder handing curtains today.  She injured her left ankle.  In the ER, she was diagnosed with a trimalleolar ankle fracture without dislocation.  Patient is seen this evening in her hospital room.  Her pain is controlled and she denies numbness or tingling and has not lost motor function of her left foot/ankle.  Patient takes aspirin and plavix at baseline.   Past Med/Surgical Hx:  Other, see comments: Panic attacks  Other, see comments: Mitral valve prolapse  Arrythmias:   Diabetes:   Hypothyroidism:   hypertension:   depression:   iddm:   CVA/Stroke:   Gall Bladder: Gall bladder taken out  Hysterectomy - Total:   ALLERGIES:  IVP Dye: Anaphylaxis  Iodine Strong: Unknown  Seafood: Anaphylaxis  HOME MEDICATIONS: Medication Instructions Status  Januvia 100 mg oral tablet 1 tab(s) orally once a day Active  diltiazem 240 mg/24 hours oral capsule, extended release 1 cap(s) orally once a day Active  clopidogrel 75 mg oral tablet 1 tab(s) orally once a day Active  aspirin 325 mg oral delayed release tablet 1 tab(s) orally once a day (at bedtime) Active  glimepiride 4 mg oral tablet 1 tab(s) orally once a day (in the evening) Active  hydrOXYzine hydrochloride 25 mg oral tablet 1 tab(s) orally once a day (at bedtime) Active  Lantus 100 units/mL subcutaneous solution 50 unit(s) subcutaneous once a day (in the morning) and 40 units every evening Active  levothyroxine 88 mcg (0.088 mg) oral tablet 1 tab(s) orally once a day Active  metFORMIN 500 mg oral tablet 1 tab(s) orally 2 times a day Active  ALPRAZolam 0.5 mg oral tablet 1 tab(s) orally once a day (in the morning) and 2 tabs ($Remov'1mg'jXhCuh$ ) orally once a day (at bedtime). Active  simvastatin 40 mg oral tablet 0.5 tab ($Remove'20mg'PeUZhxQ$ ) orally once a day  (at bedtime). Active  One-A-Day Women Multiple Vitamins with Minerals oral tablet 1 tab(s) orally once a day Active  Vitamin D3 400 intl units oral tablet 2 tabs (800iu) orally once a day. Active  sertraline 100 mg oral tablet 1 tab(s) orally 2 times a day Active  Afrin 0.05% nasal spray 2 spray(s) into each nostril once a day. Active   Family and Social History:  Family History Non-Contributory   Place of Living Home   Review of Systems:  Subjective/Chief Complaint Left ankle pain   Physical Exam:  GEN no acute distress   HEENT PERRL, hearing intact to voice, moist oral mucosa, Oropharynx clear   NECK supple  No masses  trachea midline   RESP normal resp effort  clear BS  no use of accessory muscles   CARD regular rate  no murmur  No LE edema   ABD denies tenderness  soft  normal BS  no Adominal Mass   LYMPH negative neck   EXTR Left LE:   Intact sensation to light touch in all toes, intact motor function of all toes and her toes are well perfused.  Posterior splint is in place.   PSYCH alert, A+O to time, place, person, good insight   Lab Results:  Routine BB:  23-Apr-16 19:56   ABO Group + Rh Type B Positive  Antibody Screen NEGATIVE (Result(s) reported on 22 Oct 2014 at 08:58PM.)  Routine Chem:  23-Apr-16 16:40  Glucose, Serum  333 (65-99 NOTE: New Reference Range  09/06/14)  BUN 6 (6-20 NOTE: New Reference Range  09/06/14)  Creatinine (comp) 0.68 (0.44-1.00 NOTE: New Reference Range  09/06/14)  Sodium, Serum  134 (135-145 NOTE: New Reference Range  09/06/14)  Potassium, Serum 3.7 (3.5-5.1 NOTE: New Reference Range  09/06/14)  Chloride, Serum  100 (101-111 NOTE: New Reference Range  09/06/14)  CO2, Serum 24 (22-32 NOTE: New Reference Range  09/06/14)  Calcium (Total), Serum 8.9 (8.9-10.3 NOTE: New Reference Range  09/06/14)  Anion Gap 10  eGFR (African American) >60  eGFR (Non-African American) >60 (eGFR values <84mL/min/1.73 m2 may be an  indication of chronic kidney disease (CKD). Calculated eGFR is useful in patients with stable renal function. The eGFR calculation will not be reliable in acutely ill patients when serum creatinine is changing rapidly. It is not useful in patients on dialysis. The eGFR calculation may not be applicable to patients at the low and high extremes of body sizes, pregnant women, and vegetarians.)  Routine UA:  23-Apr-16 21:59   Color (UA) Straw  Clarity (UA) Clear  Glucose (UA) 150 mg/dL  Bilirubin (UA) Negative  Ketones (UA) Negative  Specific Gravity (UA) 1.003  Blood (UA) Negative  pH (UA) 9.0  Protein (UA) Negative  Nitrite (UA) Negative  Leukocyte Esterase (UA) Negative (Result(s) reported on 22 Oct 2014 at 10:20PM.)  RBC (UA) 0-5  WBC (UA) 0-5  Bacteria (UA) RARE  Epithelial Cells (UA) NONE SEEN  Mucous (UA) PRESENT (Result(s) reported on 22 Oct 2014 at 10:20PM.)  Routine Coag:  23-Apr-16 16:40   Prothrombin 14.7 (11.4-15.0 NOTE: New Reference Range  07/29/14)  INR 1.1 (INR reference interval applies to patients on anticoagulant therapy. A single INR therapeutic range for coumarins is not optimal for all indications; however, the suggested range for most indications is 2.0 - 3.0. Exceptions to the INR Reference Range may include: Prosthetic heart valves, acute myocardial infarction, prevention of myocardial infarction, and combinations of aspirin and anticoagulant. The need for a higher or lower target INR must be assessed individually. Reference: The Pharmacology and Management of the Vitamin K  antagonists: the seventh ACCP Conference on Antithrombotic and Thrombolytic Therapy. AJGOT.1572 Sept:126 (3suppl): N9146842. A HCT value >55% may artifactually increase the PT.  In one study,  the increase was an average of 25%. Reference:  "Effect on Routine and Special Coagulation Testing Values of Citrate Anticoagulant Adjustment in Patients with High HCT Values." American  Journal of Clinical Pathology 6203;559:741-638.)    19:56   Prothrombin  15.3 (11.4-15.0 NOTE: New Reference Range  07/29/14)  INR 1.2 (INR reference interval applies to patients on anticoagulant therapy. A single INR therapeutic range for coumarins is not optimal for all indications; however, the suggested range for most indications is 2.0 - 3.0. Exceptions to the INR Reference Range may include: Prosthetic heart valves, acute myocardial infarction, prevention of myocardial infarction, and combinations of aspirin and anticoagulant. The need for a higher or lower target INR must be assessed individually. Reference: The Pharmacology and Management of the Vitamin K  antagonists: the seventh ACCP Conference on Antithrombotic and Thrombolytic Therapy. [id number] Sept:126 (3suppl): N9146842. A HCT value >55% may artifactually increase the PT.  In one study,  the increase was an average of 25%. Reference:  "Effect on Routine and Special Coagulation Testing Values of Citrate Anticoagulant Adjustment in Patients with High HCT Values." American Journal of Clinical Pathology 2006;126:400-405.)  Activated PTT (APTT) 34.3 (A HCT value >55% may artifactually  increase the APTT. In one study, the increase was an average of 19%. Reference: "Effect on Routine and Special Coagulation Testing Values of Citrate Anticoagulant Adjustment in Patients with High HCT Values." American Journal of Clinical Pathology 2006;126:400-405.)  Routine Hem:  23-Apr-16 16:40   WBC (CBC) 4.8  RBC (CBC) 4.65  Hemoglobin (CBC) 12.9  Hematocrit (CBC) 39.6  Platelet Count (CBC)  100 (Result(s) reported on 22 Oct 2014 at 05:24PM.)  MCV 85  MCH 27.7  MCHC 32.6  RDW  16.1   Radiology Results: XRay:    23-Apr-16 17:19, Ankle Left Complete  Ankle Left Complete  REASON FOR EXAM:    L ankle pain, swelling, deformity after falll off   step ladder  COMMENTS:       PROCEDURE: DXR - DXR ANKLE LEFT COMPLETE  - Oct 22 2014   5:19PM     CLINICAL DATA:  [place] off step ladder.  Pain.    EXAM:  LEFT ANKLE COMPLETE - 3+ VIEW    COMPARISON:  None.    FINDINGS:  Transverse fracture across the medial malleolus. Spiral fracture of  the distal fibula/lateral malleolus. Widening of the ankle mortise  with medial displacement tibia. Posterior malleolar fracture with  comminution. Findings consistent with a trimalleolar fracture and  ankle mortise disruption. Orthopedic surgical consultation is  recommended.     IMPRESSION:  Trimalleolar fracture.      Electronically Signed    By: Rolla Flatten M.D.    On: 10/22/2014 18:17      Verified By: Staci Righter, M.D.,  LabUnknown:  PACS Image    Assessment/Admission Diagnosis Left trimalleolar fracture   Plan Patient has a trimalleolar fracture with lateral talar subluxation.  I recommended ORIF of the left ankle.  The patient agrees with the plan for surgery.  Patient pending medical clearance.  Plan for surgery tomorrow pending medical clearance.  She is NPO after midnight.  I have reviewed her labs and xrays in preparation for surgery.   Electronic Signatures: Thornton Park (MD)  (Signed 23-Apr-16 23:42)  Authored: CHIEF COMPLAINT and HISTORY, PAST MEDICAL/SURGIAL HISTORY, ALLERGIES, HOME MEDICATIONS, FAMILY AND SOCIAL HISTORY, REVIEW OF SYSTEMS, PHYSICAL EXAM, LABS, Radiology, ASSESSMENT AND PLAN   Last Updated: 23-Apr-16 23:42 by Thornton Park (MD)

## 2014-11-26 ENCOUNTER — Other Ambulatory Visit
Admission: RE | Admit: 2014-11-26 | Discharge: 2014-11-26 | Disposition: A | Discharge status: Home / Self Care | Payer: BLUE CROSS/BLUE SHIELD | Attending: Specialist | Admitting: Specialist

## 2014-11-26 DIAGNOSIS — R509 Fever, unspecified: Secondary | ICD-10-CM | POA: Diagnosis not present

## 2014-11-26 LAB — URINALYSIS COMPLETE WITH MICROSCOPIC (ARMC ONLY)
Bilirubin Urine: NEGATIVE
GLUCOSE, UA: NEGATIVE mg/dL
Ketones, ur: NEGATIVE mg/dL
NITRITE: POSITIVE — AB
PH: 7 (ref 5.0–8.0)
Protein, ur: 100 mg/dL — AB
SPECIFIC GRAVITY, URINE: 1.013 (ref 1.005–1.030)

## 2014-11-26 LAB — COMPREHENSIVE METABOLIC PANEL
ALT: 20 U/L (ref 14–54)
AST: 51 U/L — AB (ref 15–41)
Albumin: 3 g/dL — ABNORMAL LOW (ref 3.5–5.0)
Alkaline Phosphatase: 112 U/L (ref 38–126)
Anion gap: 12 (ref 5–15)
BUN: 14 mg/dL (ref 6–20)
CHLORIDE: 102 mmol/L (ref 101–111)
CO2: 24 mmol/L (ref 22–32)
Calcium: 9.4 mg/dL (ref 8.9–10.3)
Creatinine, Ser: 1.25 mg/dL — ABNORMAL HIGH (ref 0.44–1.00)
GFR calc Af Amer: 51 mL/min — ABNORMAL LOW (ref >60)
GFR, EST NON AFRICAN AMERICAN: 44 mL/min — AB (ref >60)
GLUCOSE: 82 mg/dL (ref 65–99)
Potassium: 3.1 mmol/L — ABNORMAL LOW (ref 3.5–5.1)
SODIUM: 138 mmol/L (ref 135–145)
Total Bilirubin: 1.1 mg/dL (ref 0.3–1.2)
Total Protein: 7 g/dL (ref 6.5–8.1)

## 2014-11-26 LAB — CBC WITH DIFFERENTIAL/PLATELET
Basophils Absolute: 0 10*3/uL (ref 0–0.1)
Basophils Relative: 0 %
Eosinophils Absolute: 0 10*3/uL (ref 0–0.7)
Eosinophils Relative: 0 %
HEMATOCRIT: 38.1 % (ref 35.0–47.0)
Hemoglobin: 12.5 g/dL (ref 12.0–16.0)
LYMPHS ABS: 0.8 10*3/uL — AB (ref 1.0–3.6)
Lymphocytes Relative: 4 %
MCH: 27.5 pg (ref 26.0–34.0)
MCHC: 32.9 g/dL (ref 32.0–36.0)
MCV: 83.7 fL (ref 80.0–100.0)
MONO ABS: 1.1 10*3/uL — AB (ref 0.2–0.9)
MONOS PCT: 5 %
NEUTROS PCT: 91 %
Neutro Abs: 18.7 10*3/uL — ABNORMAL HIGH (ref 1.4–6.5)
Platelets: 127 10*3/uL — ABNORMAL LOW (ref 150–440)
RBC: 4.55 MIL/uL (ref 3.80–5.20)
RDW: 15.9 % — ABNORMAL HIGH (ref 11.5–14.5)
WBC: 20.6 10*3/uL — AB (ref 3.6–11.0)

## 2014-11-28 ENCOUNTER — Other Ambulatory Visit
Admission: RE | Admit: 2014-11-28 | Discharge: 2014-11-28 | Disposition: A | Discharge status: Home / Self Care | Payer: BLUE CROSS/BLUE SHIELD | Source: Ambulatory Visit | Attending: Specialist | Admitting: Specialist

## 2014-11-28 DIAGNOSIS — E876 Hypokalemia: Secondary | ICD-10-CM | POA: Insufficient documentation

## 2014-11-28 LAB — CBC WITH DIFFERENTIAL/PLATELET
BASOS ABS: 0 10*3/uL (ref 0–0.1)
Basophils Relative: 0 %
EOS PCT: 3 %
Eosinophils Absolute: 0.5 10*3/uL (ref 0–0.7)
HCT: 32.5 % — ABNORMAL LOW (ref 35.0–47.0)
Hemoglobin: 10.5 g/dL — ABNORMAL LOW (ref 12.0–16.0)
LYMPHS PCT: 11 %
Lymphs Abs: 2.2 10*3/uL (ref 1.0–3.6)
MCH: 26.9 pg (ref 26.0–34.0)
MCHC: 32.3 g/dL (ref 32.0–36.0)
MCV: 83.4 fL (ref 80.0–100.0)
Monocytes Absolute: 1.3 10*3/uL — ABNORMAL HIGH (ref 0.2–0.9)
Monocytes Relative: 7 %
NEUTROS ABS: 15.1 10*3/uL — AB (ref 1.4–6.5)
NEUTROS PCT: 79 %
Platelets: 114 10*3/uL — ABNORMAL LOW (ref 150–440)
RBC: 3.89 MIL/uL (ref 3.80–5.20)
RDW: 15.9 % — ABNORMAL HIGH (ref 11.5–14.5)
WBC: 19 10*3/uL — AB (ref 3.6–11.0)

## 2014-11-28 LAB — BASIC METABOLIC PANEL
ANION GAP: 9 (ref 5–15)
BUN: 27 mg/dL — ABNORMAL HIGH (ref 6–20)
CO2: 23 mmol/L (ref 22–32)
Calcium: 8.3 mg/dL — ABNORMAL LOW (ref 8.9–10.3)
Chloride: 99 mmol/L — ABNORMAL LOW (ref 101–111)
Creatinine, Ser: 1.08 mg/dL — ABNORMAL HIGH (ref 0.44–1.00)
GFR calc Af Amer: >60 mL/min (ref >60)
GFR, EST NON AFRICAN AMERICAN: 52 mL/min — AB (ref >60)
Glucose, Bld: 100 mg/dL — ABNORMAL HIGH (ref 65–99)
POTASSIUM: 3.1 mmol/L — AB (ref 3.5–5.1)
SODIUM: 131 mmol/L — AB (ref 135–145)

## 2014-11-29 LAB — URINE CULTURE: Culture: 90000

## 2014-12-01 LAB — CULTURE, BLOOD (ROUTINE X 2)
CULTURE: NO GROWTH
Culture: NO GROWTH

## 2016-04-08 ENCOUNTER — Encounter: Payer: Self-pay | Admitting: *Deleted

## 2016-04-09 ENCOUNTER — Ambulatory Visit
Admission: RE | Admit: 2016-04-09 | Discharge: 2016-04-09 | Disposition: A | Discharge status: Home / Self Care | Payer: BLUE CROSS/BLUE SHIELD | Source: Ambulatory Visit | Attending: Ophthalmology | Admitting: Ophthalmology

## 2016-04-09 ENCOUNTER — Ambulatory Visit: Payer: BLUE CROSS/BLUE SHIELD | Admitting: Anesthesiology

## 2016-04-09 ENCOUNTER — Encounter
Admission: RE | Disposition: A | Discharge status: Home / Self Care | Payer: Self-pay | Source: Ambulatory Visit | Attending: Ophthalmology

## 2016-04-09 ENCOUNTER — Encounter: Payer: Self-pay | Admitting: *Deleted

## 2016-04-09 DIAGNOSIS — E7800 Pure hypercholesterolemia, unspecified: Secondary | ICD-10-CM | POA: Insufficient documentation

## 2016-04-09 DIAGNOSIS — E039 Hypothyroidism, unspecified: Secondary | ICD-10-CM | POA: Insufficient documentation

## 2016-04-09 DIAGNOSIS — H2511 Age-related nuclear cataract, right eye: Secondary | ICD-10-CM | POA: Diagnosis present

## 2016-04-09 DIAGNOSIS — Z7984 Long term (current) use of oral hypoglycemic drugs: Secondary | ICD-10-CM | POA: Insufficient documentation

## 2016-04-09 DIAGNOSIS — F410 Panic disorder [episodic paroxysmal anxiety] without agoraphobia: Secondary | ICD-10-CM | POA: Insufficient documentation

## 2016-04-09 DIAGNOSIS — Z8673 Personal history of transient ischemic attack (TIA), and cerebral infarction without residual deficits: Secondary | ICD-10-CM | POA: Diagnosis not present

## 2016-04-09 DIAGNOSIS — Z87891 Personal history of nicotine dependence: Secondary | ICD-10-CM | POA: Insufficient documentation

## 2016-04-09 DIAGNOSIS — F329 Major depressive disorder, single episode, unspecified: Secondary | ICD-10-CM | POA: Diagnosis not present

## 2016-04-09 DIAGNOSIS — E119 Type 2 diabetes mellitus without complications: Secondary | ICD-10-CM | POA: Diagnosis not present

## 2016-04-09 DIAGNOSIS — Z79899 Other long term (current) drug therapy: Secondary | ICD-10-CM | POA: Insufficient documentation

## 2016-04-09 HISTORY — DX: Hypothyroidism, unspecified: E03.9

## 2016-04-09 HISTORY — DX: Anxiety disorder, unspecified: F41.9

## 2016-04-09 HISTORY — DX: Cerebral infarction, unspecified: I63.9

## 2016-04-09 HISTORY — DX: Depression, unspecified: F32.A

## 2016-04-09 HISTORY — DX: Panic disorder (episodic paroxysmal anxiety): F41.0

## 2016-04-09 HISTORY — PX: CATARACT EXTRACTION W/PHACO: SHX586

## 2016-04-09 HISTORY — DX: Major depressive disorder, single episode, unspecified: F32.9

## 2016-04-09 LAB — GLUCOSE, CAPILLARY: Glucose-Capillary: 333 mg/dL — ABNORMAL HIGH (ref 65–99)

## 2016-04-09 SURGERY — PHACOEMULSIFICATION, CATARACT, WITH IOL INSERTION
Anesthesia: Monitor Anesthesia Care | Site: Eye | Laterality: Right | Wound class: Clean

## 2016-04-09 MED ORDER — LIDOCAINE HCL (PF) 4 % IJ SOLN
INTRAOCULAR | Status: DC | PRN
  Administered 2016-04-09: 4 mL via OPHTHALMIC

## 2016-04-09 MED ORDER — ARMC OPHTHALMIC DILATING DROPS
1.0000 "application " | OPHTHALMIC | Status: AC
  Administered 2016-04-09 (×3): 1 via OPHTHALMIC
  Filled 2016-04-09: qty 0.4

## 2016-04-09 MED ORDER — MIDAZOLAM HCL 5 MG/5ML IJ SOLN
INTRAMUSCULAR | Status: DC | PRN
  Administered 2016-04-09: 1 mg via INTRAVENOUS

## 2016-04-09 MED ORDER — NA CHONDROIT SULF-NA HYALURON 40-17 MG/ML IO SOLN
INTRAOCULAR | Status: AC
  Filled 2016-04-09: qty 1

## 2016-04-09 MED ORDER — MOXIFLOXACIN HCL 0.5 % OP SOLN
1.0000 [drp] | OPHTHALMIC | Status: DC | PRN
  Administered 2016-04-09 (×3): 1 [drp] via OPHTHALMIC

## 2016-04-09 MED ORDER — EPINEPHRINE PF 1 MG/ML IJ SOLN
INTRAOCULAR | Status: DC | PRN
  Administered 2016-04-09: 1 mL via OPHTHALMIC

## 2016-04-09 MED ORDER — NA CHONDROIT SULF-NA HYALURON 40-17 MG/ML IO SOLN
INTRAOCULAR | Status: DC | PRN
  Administered 2016-04-09: 1 mL via INTRAOCULAR

## 2016-04-09 MED ORDER — LIDOCAINE HCL (PF) 4 % IJ SOLN
INTRAMUSCULAR | Status: AC
  Filled 2016-04-09: qty 5

## 2016-04-09 MED ORDER — INSULIN ASPART 100 UNIT/ML ~~LOC~~ SOLN
SUBCUTANEOUS | Status: AC
  Filled 2016-04-09: qty 5

## 2016-04-09 MED ORDER — POVIDONE-IODINE 5 % OP SOLN
OPHTHALMIC | Status: DC | PRN
  Administered 2016-04-09: 1 via OPHTHALMIC

## 2016-04-09 MED ORDER — INSULIN ASPART 100 UNIT/ML ~~LOC~~ SOLN
5.0000 [IU] | Freq: Once | SUBCUTANEOUS | Status: AC
  Administered 2016-04-09: 5 [IU] via SUBCUTANEOUS

## 2016-04-09 MED ORDER — EPINEPHRINE PF 1 MG/ML IJ SOLN
INTRAMUSCULAR | Status: AC
  Filled 2016-04-09: qty 2

## 2016-04-09 MED ORDER — MOXIFLOXACIN HCL 0.5 % OP SOLN
OPHTHALMIC | Status: AC
  Administered 2016-04-09: 1 [drp] via OPHTHALMIC
  Filled 2016-04-09: qty 3

## 2016-04-09 MED ORDER — MOXIFLOXACIN HCL 0.5 % OP SOLN
OPHTHALMIC | Status: DC | PRN
  Administered 2016-04-09: 1 [drp] via OPHTHALMIC

## 2016-04-09 MED ORDER — SODIUM CHLORIDE 0.9 % IV SOLN
INTRAVENOUS | Status: DC
  Administered 2016-04-09: 11:00:00 via INTRAVENOUS

## 2016-04-09 MED ORDER — POVIDONE-IODINE 5 % OP SOLN
OPHTHALMIC | Status: AC
  Filled 2016-04-09: qty 30

## 2016-04-09 SURGICAL SUPPLY — 21 items
CANNULA ANT/CHMB 27GA (MISCELLANEOUS) ×3 IMPLANT
CUP MEDICINE 2OZ PLAST GRAD ST (MISCELLANEOUS) ×3 IMPLANT
GLOVE BIO SURGEON STRL SZ8 (GLOVE) ×3 IMPLANT
GLOVE BIOGEL M 6.5 STRL (GLOVE) ×3 IMPLANT
GLOVE SURG LX 8.0 MICRO (GLOVE) ×2
GLOVE SURG LX STRL 8.0 MICRO (GLOVE) ×1 IMPLANT
GOWN STRL REUS W/ TWL LRG LVL3 (GOWN DISPOSABLE) ×2 IMPLANT
GOWN STRL REUS W/TWL LRG LVL3 (GOWN DISPOSABLE) ×6
LENS IOL TECNIS ITEC 18.0 (Intraocular Lens) ×3 IMPLANT
PACK CATARACT (MISCELLANEOUS) ×3 IMPLANT
PACK CATARACT BRASINGTON LX (MISCELLANEOUS) ×3 IMPLANT
PACK EYE AFTER SURG (MISCELLANEOUS) ×3 IMPLANT
SOL BSS BAG (MISCELLANEOUS) ×3
SOL PREP PVP 2OZ (MISCELLANEOUS) ×3
SOLUTION BSS BAG (MISCELLANEOUS) ×1 IMPLANT
SOLUTION PREP PVP 2OZ (MISCELLANEOUS) ×1 IMPLANT
SYR 3ML LL SCALE MARK (SYRINGE) ×3 IMPLANT
SYR 5ML LL (SYRINGE) ×3 IMPLANT
SYR TB 1ML 27GX1/2 LL (SYRINGE) ×3 IMPLANT
WATER STERILE IRR 250ML POUR (IV SOLUTION) ×3 IMPLANT
WIPE NON LINTING 3.25X3.25 (MISCELLANEOUS) ×3 IMPLANT

## 2016-04-09 NOTE — Op Note (Signed)
PREOPERATIVE DIAGNOSIS:  Nuclear sclerotic cataract of the right eye.   POSTOPERATIVE DIAGNOSIS:  NUCLEAR SCLEROTIC CATARACT RIGHT EYE   OPERATIVE PROCEDURE: Procedure(s): CATARACT EXTRACTION PHACO AND INTRAOCULAR LENS PLACEMENT (IOC)   SURGEON:  Galen Manila, MD.   ANESTHESIA:  Anesthesiologist: Alver Fisher, MD CRNA: Michaele Offer, CRNA  1.      Managed anesthesia care. 2.      0.2 ml of Shugarcaine was  placed in the anterior chamber following the paracentesis.    COMPLICATIONS:  None.   TECHNIQUE:   Stop and chop   DESCRIPTION OF PROCEDURE:  The patient was examined and consented in the preoperative holding area where the aforementioned topical anesthesia was applied to the right eye and then brought back to the Operating Room where the right eye was prepped and draped in the usual sterile ophthalmic fashion and a lid speculum was placed. A paracentesis was created with the side port blade and the anterior chamber was filled with viscoelastic. A near clear corneal incision was performed with the steel keratome. A continuous curvilinear capsulorrhexis was performed with a cystotome followed by the capsulorrhexis forceps. Hydrodissection and hydrodelineation were carried out with BSS on a blunt cannula. The lens was removed in a stop and chop  technique and the remaining cortical material was removed with the irrigation-aspiration handpiece. The capsular bag was inflated with viscoelastic and the Technis [id number]  lens was placed in the capsular bag without complication. The remaining viscoelastic was removed from the eye with the irrigation-aspiration handpiece. The wounds were hydrated. The anterior chamber was flushed with Miostat and the eye was inflated to physiologic pressure. 0.2 mL of Vigamox diluted three/one with BSS was placed in the anterior chamber. The wounds were found to be water tight. The eye was dressed with Vigamox. The patient was given protective glasses to wear  throughout the day and a shield with which to sleep tonight. The patient was also given drops with which to begin a drop regimen today and will follow-up with me in one day.  Implant Name Type Inv. Item Serial No. Manufacturer Lot No. LRB No. Used  LENS IOL DIOP 18.0 - [id number] Intraocular Lens LENS IOL DIOP 18.0 [id number] AMO   Right 1   Procedure(s) with comments: CATARACT EXTRACTION PHACO AND INTRAOCULAR LENS PLACEMENT (IOC) (Right) - [place] 57.2 AP% 18.6 CDE 10.64 Fluid Pack lot # [id number]  Electronically signed: Hettie Roselli LOUIS 04/09/2016 10:57 AM

## 2016-04-09 NOTE — H&P (Signed)
All labs reviewed. Abnormal studies sent to patients PCP when indicated.  Previous H&P reviewed, patient examined, there are NO CHANGES.  Jenny [name] LOUIS10/10/201710:31 AM

## 2016-04-09 NOTE — Transfer of Care (Signed)
Immediate Anesthesia Transfer of Care Note  Patient: Jenny [name]  Procedure(s) Performed: Procedure(s) with comments: CATARACT EXTRACTION PHACO AND INTRAOCULAR LENS PLACEMENT (IOC) (Right) - [place] 57.2 AP% 18.6 CDE 10.64 Fluid Pack lot # [id number]  Patient Location: PACU  Anesthesia Type:MAC  Level of Consciousness: awake, alert , oriented and patient cooperative  Airway & Oxygen Therapy: Patient Spontanous Breathing  Post-op Assessment: Report given to RN, Post -op Vital signs reviewed and stable and Patient moving all extremities X 4  Post vital signs: Reviewed and stable  Last Vitals:  Vitals:   04/09/16 0956  BP: (!) 131/56  Pulse: 80  Resp: 16  Temp: 36.8 C    Last Pain:  Vitals:   04/09/16 0956  TempSrc: Oral  PainSc: 0-No pain         Complications: No apparent anesthesia complications

## 2016-04-09 NOTE — OR Nursing (Signed)
No IV charted. IV discontinued without difficulty. No edema or redness. Site clear.

## 2016-04-09 NOTE — Anesthesia Postprocedure Evaluation (Signed)
Anesthesia Post Note  Patient: Jenny [name]  Procedure(s) Performed: Procedure(s) (LRB): CATARACT EXTRACTION PHACO AND INTRAOCULAR LENS PLACEMENT (IOC) (Right)  Patient location during evaluation: PACU Anesthesia Type: MAC Level of consciousness: awake and alert, oriented and patient cooperative Pain management: satisfactory to patient Vital Signs Assessment: post-procedure vital signs reviewed and stable Respiratory status: respiratory function stable Cardiovascular status: stable Anesthetic complications: no    Last Vitals:  Vitals:   04/09/16 0956  BP: (!) 131/56  Pulse: 80  Resp: 16  Temp: 36.8 C    Last Pain:  Vitals:   04/09/16 0956  TempSrc: Oral  PainSc: 0-No pain                 Michaele Offer A

## 2016-04-09 NOTE — Anesthesia Preprocedure Evaluation (Signed)
Anesthesia Evaluation  Patient identified by MRN, date of birth, ID band Patient awake    Reviewed: Allergy & Precautions, NPO status , Patient's Chart, lab work & pertinent test results  History of Anesthesia Complications Negative for: history of anesthetic complications  Airway Mallampati: II  TM Distance: >3 FB Neck ROM: Full    Dental  (+) Upper Dentures, Lower Dentures   Pulmonary neg sleep apnea, neg COPD, former smoker,    breath sounds clear to auscultation- rhonchi (-) wheezing      Cardiovascular Exercise Tolerance: Good (-) hypertension(-) CAD and (-) Past MI  Rhythm:Regular Rate:Normal - Systolic murmurs and - Diastolic murmurs    Neuro/Psych Anxiety Depression CVA, No Residual Symptoms    GI/Hepatic negative GI ROS, Neg liver ROS,   Endo/Other  diabetes, Type 2, Oral Hypoglycemic AgentsHypothyroidism   Renal/GU negative Renal ROS     Musculoskeletal negative musculoskeletal ROS (+)   Abdominal (+) - obese,   Peds  Hematology negative hematology ROS (+)   Anesthesia Other Findings Past Medical History: No date: Anxiety No date: Depression No date: Diabetes mellitus without complication (HCC) No date: Dysrhythmia No date: Heart murmur No date: Hypothyroidism No date: Panic attacks No date: Stroke [facility])     Comment: 2001/ TIA 2006   Reproductive/Obstetrics                             Anesthesia Physical Anesthesia Plan  ASA: III  Anesthesia Plan: MAC   Post-op Pain Management:    Induction: Intravenous  Airway Management Planned: Natural Airway  Additional Equipment:   Intra-op Plan:   Post-operative Plan:   Informed Consent: I have reviewed the patients History and Physical, chart, labs and discussed the procedure including the risks, benefits and alternatives for the proposed anesthesia with the patient or authorized representative who has indicated his/her  understanding and acceptance.   Dental advisory given  Plan Discussed with: CRNA and Anesthesiologist  Anesthesia Plan Comments:         Anesthesia Quick Evaluation

## 2016-04-09 NOTE — Discharge Instructions (Signed)
Eye Surgery Discharge Instructions  Expect mild scratchy sensation or mild soreness. DO NOT RUB YOUR EYE!  The day of surgery:  Minimal physical activity, but bed rest is not required  No reading, computer work, or close hand work  No bending, lifting, or straining.  May watch TV  For 24 hours:  No driving, legal decisions, or alcoholic beverages  Safety precautions  Eat anything you prefer: It is better to start with liquids, then soup then solid foods.  _____ Eye patch should be worn until postoperative exam tomorrow.  ____ Solar shield eyeglasses should be worn for comfort in the sunlight/patch while sleeping  Resume all regular medications including aspirin or Coumadin if these were discontinued prior to surgery. You may shower, bathe, shave, or wash your hair. Tylenol may be taken for mild discomfort.  Call your doctor if you experience significant pain, nausea, or vomiting, fever > 101 or other signs of infection. [phone] or [phone] Specific instructions:  Follow-up Information    Carlena Bjornstad, MD .   Specialty:  Ophthalmology Why:  October 11 at 945am Contact information: [address] [place] [address] [phone]

## 2017-01-15 ENCOUNTER — Observation Stay
Admission: EM | Admit: 2017-01-15 | Discharge: 2017-01-17 | Disposition: A | Discharge status: Home / Self Care | Payer: BLUE CROSS/BLUE SHIELD | Attending: Internal Medicine | Admitting: Internal Medicine

## 2017-01-15 ENCOUNTER — Emergency Department: Payer: BLUE CROSS/BLUE SHIELD

## 2017-01-15 ENCOUNTER — Encounter: Payer: Self-pay | Admitting: Emergency Medicine

## 2017-01-15 ENCOUNTER — Observation Stay (HOSPITAL_BASED_OUTPATIENT_CLINIC_OR_DEPARTMENT_OTHER)
Admit: 2017-01-15 | Discharge: 2017-01-15 | Disposition: A | Discharge status: Home / Self Care | Payer: BLUE CROSS/BLUE SHIELD | Attending: Internal Medicine | Admitting: Internal Medicine

## 2017-01-15 DIAGNOSIS — I214 Non-ST elevation (NSTEMI) myocardial infarction: Secondary | ICD-10-CM | POA: Diagnosis not present

## 2017-01-15 DIAGNOSIS — Z87891 Personal history of nicotine dependence: Secondary | ICD-10-CM | POA: Insufficient documentation

## 2017-01-15 DIAGNOSIS — I5031 Acute diastolic (congestive) heart failure: Secondary | ICD-10-CM | POA: Diagnosis not present

## 2017-01-15 DIAGNOSIS — Z794 Long term (current) use of insulin: Secondary | ICD-10-CM | POA: Diagnosis not present

## 2017-01-15 DIAGNOSIS — R079 Chest pain, unspecified: Secondary | ICD-10-CM | POA: Diagnosis present

## 2017-01-15 DIAGNOSIS — F419 Anxiety disorder, unspecified: Secondary | ICD-10-CM | POA: Insufficient documentation

## 2017-01-15 DIAGNOSIS — I5043 Acute on chronic combined systolic (congestive) and diastolic (congestive) heart failure: Secondary | ICD-10-CM | POA: Diagnosis not present

## 2017-01-15 DIAGNOSIS — E876 Hypokalemia: Secondary | ICD-10-CM | POA: Diagnosis not present

## 2017-01-15 DIAGNOSIS — I208 Other forms of angina pectoris: Secondary | ICD-10-CM | POA: Diagnosis present

## 2017-01-15 DIAGNOSIS — Z7902 Long term (current) use of antithrombotics/antiplatelets: Secondary | ICD-10-CM | POA: Diagnosis not present

## 2017-01-15 DIAGNOSIS — R7989 Other specified abnormal findings of blood chemistry: Secondary | ICD-10-CM | POA: Diagnosis present

## 2017-01-15 DIAGNOSIS — F329 Major depressive disorder, single episode, unspecified: Secondary | ICD-10-CM | POA: Diagnosis not present

## 2017-01-15 DIAGNOSIS — I2089 Other forms of angina pectoris: Secondary | ICD-10-CM | POA: Diagnosis present

## 2017-01-15 DIAGNOSIS — I509 Heart failure, unspecified: Secondary | ICD-10-CM

## 2017-01-15 DIAGNOSIS — Z79899 Other long term (current) drug therapy: Secondary | ICD-10-CM | POA: Insufficient documentation

## 2017-01-15 DIAGNOSIS — I639 Cerebral infarction, unspecified: Secondary | ICD-10-CM | POA: Diagnosis not present

## 2017-01-15 DIAGNOSIS — E1140 Type 2 diabetes mellitus with diabetic neuropathy, unspecified: Secondary | ICD-10-CM | POA: Diagnosis not present

## 2017-01-15 DIAGNOSIS — I110 Hypertensive heart disease with heart failure: Secondary | ICD-10-CM | POA: Diagnosis not present

## 2017-01-15 DIAGNOSIS — I350 Nonrheumatic aortic (valve) stenosis: Secondary | ICD-10-CM | POA: Insufficient documentation

## 2017-01-15 DIAGNOSIS — R0603 Acute respiratory distress: Secondary | ICD-10-CM | POA: Diagnosis present

## 2017-01-15 DIAGNOSIS — D649 Anemia, unspecified: Secondary | ICD-10-CM | POA: Diagnosis present

## 2017-01-15 DIAGNOSIS — Z8673 Personal history of transient ischemic attack (TIA), and cerebral infarction without residual deficits: Secondary | ICD-10-CM | POA: Insufficient documentation

## 2017-01-15 DIAGNOSIS — R778 Other specified abnormalities of plasma proteins: Secondary | ICD-10-CM | POA: Diagnosis present

## 2017-01-15 DIAGNOSIS — E039 Hypothyroidism, unspecified: Secondary | ICD-10-CM | POA: Diagnosis not present

## 2017-01-15 DIAGNOSIS — E119 Type 2 diabetes mellitus without complications: Secondary | ICD-10-CM | POA: Diagnosis not present

## 2017-01-15 DIAGNOSIS — Z7982 Long term (current) use of aspirin: Secondary | ICD-10-CM | POA: Diagnosis not present

## 2017-01-15 LAB — CBC
HCT: 26.3 % — ABNORMAL LOW (ref 35.0–47.0)
Hemoglobin: 8.4 g/dL — ABNORMAL LOW (ref 12.0–16.0)
MCH: 22.6 pg — ABNORMAL LOW (ref 26.0–34.0)
MCHC: 31.8 g/dL — ABNORMAL LOW (ref 32.0–36.0)
MCV: 71 fL — ABNORMAL LOW (ref 80.0–100.0)
PLATELETS: 134 10*3/uL — AB (ref 150–440)
RBC: 3.71 MIL/uL — ABNORMAL LOW (ref 3.80–5.20)
RDW: 18.2 % — AB (ref 11.5–14.5)
WBC: 6.5 10*3/uL (ref 3.6–11.0)

## 2017-01-15 LAB — BASIC METABOLIC PANEL
ANION GAP: 9 (ref 5–15)
BUN: 7 mg/dL (ref 6–20)
CALCIUM: 8.8 mg/dL — AB (ref 8.9–10.3)
CO2: 25 mmol/L (ref 22–32)
Chloride: 103 mmol/L (ref 101–111)
Creatinine, Ser: 0.72 mg/dL (ref 0.44–1.00)
Glucose, Bld: 103 mg/dL — ABNORMAL HIGH (ref 65–99)
Potassium: 3 mmol/L — ABNORMAL LOW (ref 3.5–5.1)
Sodium: 137 mmol/L (ref 135–145)

## 2017-01-15 LAB — GLUCOSE, CAPILLARY
Glucose-Capillary: 183 mg/dL — ABNORMAL HIGH (ref 65–99)
Glucose-Capillary: 191 mg/dL — ABNORMAL HIGH (ref 65–99)

## 2017-01-15 LAB — TROPONIN I
Troponin I: 0.07 ng/mL (ref <0.03)
Troponin I: 0.05 ng/mL (ref <0.03)
Troponin I: 0.05 ng/mL (ref <0.03)

## 2017-01-15 LAB — LIPID PANEL
CHOLESTEROL: 92 mg/dL (ref 0–200)
HDL: 36 mg/dL — ABNORMAL LOW (ref >40)
LDL Cholesterol: 37 mg/dL (ref 0–99)
Total CHOL/HDL Ratio: 2.6 RATIO
Triglycerides: 97 mg/dL (ref <150)
VLDL: 19 mg/dL (ref 0–40)

## 2017-01-15 LAB — BRAIN NATRIURETIC PEPTIDE: B NATRIURETIC PEPTIDE 5: 1605 pg/mL — AB (ref 0.0–100.0)

## 2017-01-15 MED ORDER — ROSUVASTATIN CALCIUM 10 MG PO TABS
10.0000 mg | ORAL_TABLET | Freq: Every day | ORAL | Status: DC
  Administered 2017-01-15 – 2017-01-17 (×3): 10 mg via ORAL
  Filled 2017-01-15 (×3): qty 1

## 2017-01-15 MED ORDER — INSULIN GLARGINE 100 UNIT/ML ~~LOC~~ SOLN
21.0000 [IU] | Freq: Every day | SUBCUTANEOUS | Status: DC
  Administered 2017-01-16 – 2017-01-17 (×2): 21 [IU] via SUBCUTANEOUS
  Filled 2017-01-15 (×2): qty 0.21

## 2017-01-15 MED ORDER — ACETAMINOPHEN 325 MG PO TABS: 650.0000 mg | ORAL_TABLET | Freq: Four times a day (QID) | ORAL | Status: DC | PRN

## 2017-01-15 MED ORDER — SERTRALINE HCL 50 MG PO TABS
100.0000 mg | ORAL_TABLET | Freq: Two times a day (BID) | ORAL | Status: DC
  Administered 2017-01-15 – 2017-01-17 (×4): 100 mg via ORAL
  Filled 2017-01-15 (×4): qty 2

## 2017-01-15 MED ORDER — ALPRAZOLAM 1 MG PO TABS
1.0000 mg | ORAL_TABLET | Freq: Every day | ORAL | Status: DC
  Administered 2017-01-15 – 2017-01-16 (×2): 1 mg via ORAL
  Filled 2017-01-15 (×2): qty 1

## 2017-01-15 MED ORDER — SODIUM CHLORIDE 0.9% FLUSH
3.0000 mL | Freq: Two times a day (BID) | INTRAVENOUS | Status: DC
  Administered 2017-01-15 – 2017-01-17 (×3): 3 mL via INTRAVENOUS

## 2017-01-15 MED ORDER — HEPARIN SODIUM (PORCINE) 5000 UNIT/ML IJ SOLN
5000.0000 [IU] | Freq: Three times a day (TID) | INTRAMUSCULAR | Status: DC
  Administered 2017-01-15 – 2017-01-17 (×5): 5000 [IU] via SUBCUTANEOUS
  Filled 2017-01-15 (×6): qty 1

## 2017-01-15 MED ORDER — INSULIN ASPART 100 UNIT/ML ~~LOC~~ SOLN
0.0000 [IU] | Freq: Three times a day (TID) | SUBCUTANEOUS | Status: DC
  Administered 2017-01-15 – 2017-01-16 (×4): 2 [IU] via SUBCUTANEOUS
  Administered 2017-01-17: 5 [IU] via SUBCUTANEOUS
  Administered 2017-01-17: 2 [IU] via SUBCUTANEOUS
  Filled 2017-01-15 (×6): qty 1

## 2017-01-15 MED ORDER — DILTIAZEM HCL ER COATED BEADS 120 MG PO CP24
240.0000 mg | ORAL_CAPSULE | Freq: Every day | ORAL | Status: DC
  Administered 2017-01-16 – 2017-01-17 (×2): 240 mg via ORAL
  Filled 2017-01-15 (×2): qty 2

## 2017-01-15 MED ORDER — ALPRAZOLAM 0.5 MG PO TABS
0.5000 mg | ORAL_TABLET | Freq: Every morning | ORAL | Status: DC
  Administered 2017-01-16 – 2017-01-17 (×2): 0.5 mg via ORAL
  Filled 2017-01-15 (×2): qty 1

## 2017-01-15 MED ORDER — POTASSIUM CHLORIDE CRYS ER 20 MEQ PO TBCR
40.0000 meq | EXTENDED_RELEASE_TABLET | Freq: Once | ORAL | Status: AC
  Administered 2017-01-15: 40 meq via ORAL
  Filled 2017-01-15: qty 2

## 2017-01-15 MED ORDER — FUROSEMIDE 10 MG/ML IJ SOLN
20.0000 mg | Freq: Two times a day (BID) | INTRAMUSCULAR | Status: DC
  Administered 2017-01-15 – 2017-01-16 (×2): 20 mg via INTRAVENOUS
  Filled 2017-01-15 (×2): qty 2

## 2017-01-15 MED ORDER — LEVOTHYROXINE SODIUM 88 MCG PO TABS
88.0000 ug | ORAL_TABLET | Freq: Every day | ORAL | Status: DC
  Administered 2017-01-16 – 2017-01-17 (×2): 88 ug via ORAL
  Filled 2017-01-15 (×2): qty 1

## 2017-01-15 MED ORDER — DIPHENHYDRAMINE HCL 25 MG PO TABS
25.0000 mg | ORAL_TABLET | Freq: Four times a day (QID) | ORAL | Status: DC | PRN
  Filled 2017-01-15: qty 1

## 2017-01-15 MED ORDER — DOCUSATE SODIUM 100 MG PO CAPS: 100.0000 mg | ORAL_CAPSULE | Freq: Two times a day (BID) | ORAL | Status: DC | PRN

## 2017-01-15 MED ORDER — ALPRAZOLAM 0.5 MG PO TABS: 0.5000 mg | ORAL_TABLET | Freq: Two times a day (BID) | ORAL | Status: DC

## 2017-01-15 MED ORDER — HYDROXYZINE HCL 25 MG PO TABS
25.0000 mg | ORAL_TABLET | Freq: Two times a day (BID) | ORAL | Status: DC
  Administered 2017-01-15 – 2017-01-17 (×4): 25 mg via ORAL
  Filled 2017-01-15 (×6): qty 1

## 2017-01-15 MED ORDER — CLOPIDOGREL BISULFATE 75 MG PO TABS
75.0000 mg | ORAL_TABLET | Freq: Every day | ORAL | Status: DC
  Administered 2017-01-16: 75 mg via ORAL
  Filled 2017-01-15: qty 1

## 2017-01-15 NOTE — ED Notes (Signed)
Patient transported to X-ray 

## 2017-01-15 NOTE — ED Notes (Signed)
Admission MD at bedside.  

## 2017-01-15 NOTE — ED Provider Notes (Signed)
[facility] Emergency Department Provider Note  ____________________________________________  Time seen: Approximately 1:52 PM  I have reviewed the triage vital signs and the nursing notes.   HISTORY  Chief Complaint Chest Pain    HPI Jenny [name] is a 71 y.o. female with a history of HTN, DM, TIA, sent from Dr. Renie Ora office for chest pain with EKG changes. The patient reports that several nights ago, she does not remember when, she woke up coughing, with sharp chest pain centrally which radiated to the neck and jaw. She did not have any associated shortness breath, palpitations, lightheadedness or syncope. When she sat up, the symptoms improved. She has had several self resolving additional episodes since then. Coughing makes the pain significant only worse. She denies any congestion or rhinorrhea, sore throat, ear pain, fever or chills. She was seen today in Dr. Renie Ora office with concerns about some EKG changes in the posterior leads. At this time, the patient is completely asymptomatic. The patient is on Plavix and has had a full aspirin today.  Note that the patient is a difficult historian and has given multiple versions of her recent history.   Past Medical History:  Diagnosis Date  . Anxiety   . Depression   . Diabetes mellitus without complication (HCC)   . Dysrhythmia   . Heart murmur   . Hypothyroidism   . Panic attacks   . Stroke [facility])    2001/ TIA 2006    There are no active problems to display for this patient.   Past Surgical History:  Procedure Laterality Date  . ABDOMINAL HYSTERECTOMY    . CATARACT EXTRACTION W/PHACO Right 04/09/2016   Procedure: CATARACT EXTRACTION PHACO AND INTRAOCULAR LENS PLACEMENT (IOC);  Surgeon: Galen Manila, MD;  Location: [facility] ORS;  Service: Ophthalmology;  Laterality: Right;  [place] 57.2 AP% 18.6 CDE 10.64 Fluid Pack lot # [id number]  . CHOLECYSTECTOMY    . FRACTURE SURGERY     LEFT ANKLE     Current Outpatient Rx  . Order #: [id number] Class: Historical Med  . Order #: [id number] Class: Historical Med  . Order #: [id number] Class: Historical Med  . Order #: [id number] Class: Historical Med  . Order #: [id number] Class: Historical Med  . Order #: [id number] Class: Historical Med  . Order #: [id number] Class: Historical Med  . Order #: [id number] Class: Historical Med  . Order #: [id number] Class: Historical Med  . Order #: [id number] Class: Historical Med  . Order #: [id number] Class: Historical Med  . Order #: [id number] Class: Historical Med    Allergies Shellfish allergy and Iodine  No family history on file.  Social History Social History  Substance Use Topics  . Smoking status: Former Games developer  . Smokeless tobacco: Never Used  . Alcohol use No    Review of Systems Constitutional: No fever/chills. No lightheadedness or syncope. Eyes: No visual changes. No blurred or double vision. ENT: No sore throat. No congestion or rhinorrhea. Cardiovascular: Positive chest pain. Denies palpitations. Respiratory: Denies shortness of breath.  Positive nonproductive cough. Gastrointestinal: No abdominal pain.  No nausea, no vomiting.  No diarrhea.  No constipation. Genitourinary: Negative for dysuria. Musculoskeletal: Negative for back pain. No lower extremity edema. No calf pain. Skin: Negative for rash. Neurological: Negative for headaches. No focal numbness, tingling or weakness.     ____________________________________________   PHYSICAL EXAM:  VITAL SIGNS: ED Triage Vitals [01/15/17 1345]  Enc Vitals Group     BP      Pulse      Resp  Temp      Temp src      SpO2      Weight 140 lb (63.5 kg)     Height 4\' 10"  (1.473 m)     Head Circumference      Peak Flow      Pain Score      Pain Loc      Pain Edu?      Excl. in GC?     Constitutional: Alert and oriented. Well appearing and in no acute distress. Answers questions appropriately. Eyes: Conjunctivae are normal.   EOMI. No scleral icterus. Head: Atraumatic. Nose: No congestion/rhinnorhea. Mouth/Throat: Mucous membranes are moist.  Neck: No stridor.  Supple.  No JVD. No meningismus. Cardiovascular: Normal rate, regular rhythm. No murmurs, rubs or gallops.  Respiratory: Normal respiratory effort.  No accessory muscle use or retractions. Lungs CTAB.  No wheezes, rales or ronchi. Gastrointestinal: Soft, nontender and nondistended.  No guarding or rebound.  No peritoneal signs. Musculoskeletal: No LE edema. No tenderness to palpation in the calves, negative Homans sign. Neurologic:  A&Ox3.  Speech is clear.  Face and smile are symmetric.  EOMI.  Moves all extremities well. Skin:  Skin is warm, dry and intact. No rash noted. Psychiatric: Mood and affect are normal. Speech and behavior are normal.  Normal judgement  ____________________________________________   LABS (all labs ordered are listed, but only abnormal results are displayed)  Labs Reviewed  CBC - Abnormal; Notable for the following:       Result Value   RBC 3.71 (*)    Hemoglobin 8.4 (*)    HCT 26.3 (*)    MCV 71.0 (*)    MCH 22.6 (*)    MCHC 31.8 (*)    RDW 18.2 (*)    Platelets 134 (*)    All other components within normal limits  BASIC METABOLIC PANEL - Abnormal; Notable for the following:    Potassium 3.0 (*)    Glucose, Bld 103 (*)    Calcium 8.8 (*)    All other components within normal limits  TROPONIN I - Abnormal; Notable for the following:    Troponin I 0.07 (*)    All other components within normal limits  BRAIN NATRIURETIC PEPTIDE   ____________________________________________  EKG  ED ECG REPORT I, , the attending physician, personally viewed and interpreted this ECG.   Date: 01/15/2017  EKG Time: 1356  Rate: 82  Rhythm: normal sinus rhythm  Axis: normal  Intervals:none  ST&T Change: 0.25 mm ST elevation in V1. T-wave inversion in V1. No  STEMI.  ____________________________________________  RADIOLOGY  Dg Chest 2 View  Result Date: 01/15/2017 CLINICAL DATA:  Chest pain. EXAM: CHEST  2 VIEW COMPARISON:  10/22/2014 . FINDINGS: Mediastinum and hilar structures normal. Mild cardiomegaly and venous congestion. Mild interstitial prominence. Mild component CHF cannot be excluded. Pneumonitis cannot be excluded. Small bilateral pleural effusions. Surgical clips right upper quadrant. IMPRESSION: Cardiomegaly with mild pulmonary vascular prominence, bilateral interstitial prominence, and small bilateral pleural effusions suggesting mild CHF. Pneumonitis cannot be excluded. Electronically Signed   By: 10/24/2014  Register   On: 01/15/2017 14:23    ____________________________________________   PROCEDURES  Procedure(s) performed: None  Procedures  Critical Care performed: Yes ____________________________________________   INITIAL IMPRESSION / ASSESSMENT AND PLAN / ED COURSE  Pertinent labs & imaging results that were available during my care of the patient were reviewed by me and considered in my medical decision making (see chart for details).  70  y.o. female with a history of TIA and diabetes presenting for chest pain and concerns about EKG changes. Overall, the patient is hemodynamically stable and a symptomatic at this time. We will do an ACS and MI workup. Consider pulmonary causes for the patient's chest pain given that she is having a cough. Pneumonia is less likely without any fever or other URI symptoms. Plan reevaluation for final disposition.  ----------------------------------------- 2:56 PM on 01/15/2017 -----------------------------------------  The patient continues to be chest pain-free. She does have an elevated troponin at 0.07 and has received aspirin. She is also hypokalemic and has received potassium for supplementation.  I have paged Dr. Harl Bowie for further evaluation and treatment, and will plan to admit  the patient at this time.  CRITICAL CARE Performed by: Rockne Menghini   Total critical care time: 35 minutes  Critical care time was exclusive of separately billable procedures and treating other patients.  Critical care was necessary to treat or prevent imminent or life-threatening deterioration.  Critical care was time spent personally by me on the following activities: development of treatment plan with patient and/or surrogate as well as nursing, discussions with consultants, evaluation of patient's response to treatment, examination of patient, obtaining history from patient or surrogate, ordering and performing treatments and interventions, ordering and review of laboratory studies, ordering and review of radiographic studies, pulse oximetry and re-evaluation of patient's condition.   ____________________________________________  FINAL CLINICAL IMPRESSION(S) / ED DIAGNOSES  Final diagnoses:  NSTEMI (non-ST elevated myocardial infarction) (HCC)         NEW MEDICATIONS STARTED DURING THIS VISIT:  New Prescriptions   No medications on file      Rockne Menghini, MD 01/15/17 1527

## 2017-01-15 NOTE — H&P (Signed)
[facility] [place] [facility]   PATIENT NAME: Jenny [name]    MR#:  [id number]  DATE OF BIRTH:  [birth year]  DATE OF ADMISSION:  01/15/2017  PRIMARY CARE PHYSICIAN: Aretha Parrot, MD   REQUESTING/REFERRING PHYSICIAN: norman  CHIEF COMPLAINT:   Chief Complaint  Patient presents with  . Chest Pain    HISTORY OF PRESENT ILLNESS: Jenny [name]  is a 71 y.o. female with a known history of Anxiety, depression, diabetes, heart murmur, hypothyroidism, stroke- was having some chest pain and shortness of breath especially with activities for last 2-3 days. She went for her routine checkup with Dr. Harl Bowie, he did EKG and suspected she may have posterior wall MI and decided to send her to emergency room. Patient's troponin is borderline. As she was sent by a cardiologist from office ER physician suggested to monitor her for further cardiac workup.  On chest x-ray she is noted to have some pulmonary edema. She does not have echocardiogram done in our system in the charts.  PAST MEDICAL HISTORY:   Past Medical History:  Diagnosis Date  . Anxiety   . Depression   . Diabetes mellitus without complication (HCC)   . Dysrhythmia   . Heart murmur   . Hypothyroidism   . Panic attacks   . Stroke [facility])    2001/ TIA 2006    PAST SURGICAL HISTORY: Past Surgical History:  Procedure Laterality Date  . ABDOMINAL HYSTERECTOMY    . CATARACT EXTRACTION W/PHACO Right 04/09/2016   Procedure: CATARACT EXTRACTION PHACO AND INTRAOCULAR LENS PLACEMENT (IOC);  Surgeon: Galen Manila, MD;  Location: [facility] ORS;  Service: Ophthalmology;  Laterality: Right;  [place] 57.2 AP% 18.6 CDE 10.64 Fluid Pack lot # [id number]  . CHOLECYSTECTOMY    . FRACTURE SURGERY     LEFT ANKLE    SOCIAL HISTORY:  Social History  Substance Use Topics  . Smoking status: Former Games developer  . Smokeless tobacco: Never Used  . Alcohol use No    FAMILY HISTORY:  Family History  Problem Relation Age of Onset   . Hypertension Mother     DRUG ALLERGIES:  Allergies  Allergen Reactions  . Shellfish Allergy Anaphylaxis  . Iodine     BETADINE OKAY    REVIEW OF SYSTEMS:   CONSTITUTIONAL: No fever, fatigue or weakness.  EYES: No blurred or double vision.  EARS, NOSE, AND THROAT: No tinnitus or ear pain.  RESPIRATORY: No cough, shortness of breath, wheezing or hemoptysis.  CARDIOVASCULAR: Positive for chest pain, no orthopnea, edema.  GASTROINTESTINAL: No nausea, vomiting, diarrhea or abdominal pain.  GENITOURINARY: No dysuria, hematuria.  ENDOCRINE: No polyuria, nocturia,  HEMATOLOGY: No anemia, easy bruising or bleeding SKIN: No rash or lesion. MUSCULOSKELETAL: No joint pain or arthritis.   NEUROLOGIC: No tingling, numbness, weakness.  PSYCHIATRY: No anxiety or depression.   MEDICATIONS AT HOME:  Prior to Admission medications   Medication Sig Start Date End Date Taking? Authorizing Provider  acetaminophen (TYLENOL) 325 MG tablet Take 650 mg by mouth every 6 (six) hours.   Yes [provider]  ALPRAZolam Prudy Feeler) 0.5 MG tablet Take 0.5 mg by mouth 2 (two) times daily. Take 0.5 mg in the morning and 1 mg at night.   Yes [provider]  clopidogrel (PLAVIX) 75 MG tablet Take 75 mg by mouth daily.   Yes [provider]  DILT-XR 240 MG 24 hr capsule Take 240 mg by mouth daily. 11/22/16  Yes [provider]  diphenhydrAMINE (  BENADRYL) 25 MG tablet Take 25 mg by mouth every 6 (six) hours as needed.   Yes [provider]  hydrOXYzine (ATARAX/VISTARIL) 25 MG tablet Take 25 mg by mouth 2 (two) times daily. 12/16/16  Yes [provider]  JANUVIA 100 MG tablet Take 100 mg by mouth daily. 12/20/16  Yes [provider]  LANTUS 100 UNIT/ML injection Inject 21 Units into the skin daily. 12/23/16  Yes [provider]  levothyroxine (SYNTHROID, LEVOTHROID) 88 MCG tablet Take 88 mcg by mouth daily before breakfast.   Yes [provider]  metFORMIN (GLUCOPHAGE) 500 MG tablet Take 1,000 mg by mouth 2 (two) times daily with a meal.   Yes [provider]  rosuvastatin (CRESTOR) 10 MG tablet Take 10 mg by mouth daily. 11/15/16  Yes [provider]  sertraline (ZOLOFT) 100 MG tablet Take 100 mg by mouth 2 (two) times daily.   Yes [provider]      PHYSICAL EXAMINATION:   VITAL SIGNS: Blood pressure 104/68, pulse 83, temperature 98.2 F (36.8 C), temperature source Oral, resp. rate 18, height 4\' 10"  (1.473 m), weight 63.5 kg (140 lb), SpO2 97 %.  GENERAL:  72 y.o.-year-old patient lying in the bed with no acute distress.  EYES: Pupils equal, round, reactive to light and accommodation. No scleral icterus. Extraocular muscles intact.  HEENT: Head atraumatic, normocephalic. Oropharynx and nasopharynx clear.  NECK:  Supple, no jugular venous distention. No thyroid enlargement, no tenderness.  LUNGS: Normal breath sounds bilaterally, no wheezing, rales,rhonchi or crepitation. No use of accessory muscles of respiration.  CARDIOVASCULAR: S1, S2 normal. Positive for murmurs, no rubs, or gallops.  ABDOMEN: Soft, nontender, nondistended. Bowel sounds present. No organomegaly or mass.  EXTREMITIES: No pedal edema, cyanosis, or clubbing.  NEUROLOGIC: Cranial nerves II through XII are intact. Muscle strength 5/5 in all extremities. Sensation intact. Gait not checked.  PSYCHIATRIC: The patient is alert and oriented x 3.  SKIN: No obvious rash, lesion, or ulcer.   LABORATORY PANEL:   CBC  Recent Labs Lab 01/15/17 1353  WBC 6.5  HGB 8.4*  HCT 26.3*  PLT 134*  MCV 71.0*  MCH 22.6*  MCHC 31.8*  RDW 18.2*   ------------------------------------------------------------------------------------------------------------------  Chemistries   Recent Labs Lab 01/15/17 1353  NA 137  K 3.0*  CL 103  CO2 25  GLUCOSE 103*  BUN 7  CREATININE 0.72  CALCIUM 8.8*    ------------------------------------------------------------------------------------------------------------------ estimated creatinine clearance is 53 mL/min (by C-G formula based on SCr of 0.72 mg/dL). ------------------------------------------------------------------------------------------------------------------ No results for input(s): TSH, T4TOTAL, T3FREE, THYROIDAB in the last 72 hours.  Invalid input(s): FREET3   Coagulation profile No results for input(s): INR, PROTIME in the last 168 hours. ------------------------------------------------------------------------------------------------------------------- No results for input(s): DDIMER in the last 72 hours. -------------------------------------------------------------------------------------------------------------------  Cardiac Enzymes  Recent Labs Lab 01/15/17 1353  TROPONINI 0.07*   ------------------------------------------------------------------------------------------------------------------ Invalid input(s): POCBNP  ---------------------------------------------------------------------------------------------------------------  Urinalysis    Component Value Date/Time   COLORURINE YELLOW (A) 11/26/2014 1708   APPEARANCEUR HAZY (A) 11/26/2014 1708   APPEARANCEUR Clear 10/22/2014 2159   LABSPEC 1.013 11/26/2014 1708   LABSPEC 1.003 10/22/2014 2159   PHURINE 7.0 11/26/2014 1708   GLUCOSEU NEGATIVE 11/26/2014 1708   GLUCOSEU 150 mg/dL 11/28/2014 16/04/9603   HGBUR 2+ (A) 11/26/2014 1708   BILIRUBINUR NEGATIVE 11/26/2014 1708   BILIRUBINUR Negative 10/22/2014 2159   KETONESUR NEGATIVE 11/26/2014 1708   PROTEINUR 100 (A) 11/26/2014 1708   NITRITE POSITIVE (A) 11/26/2014 1708   LEUKOCYTESUR  1+ (A) 11/26/2014 1708   LEUKOCYTESUR Negative 10/22/2014 2159     RADIOLOGY: Dg Chest 2 View  Result Date: 01/15/2017 CLINICAL DATA:  Chest pain. EXAM: CHEST  2 VIEW COMPARISON:  10/22/2014 . FINDINGS: Mediastinum and  hilar structures normal. Mild cardiomegaly and venous congestion. Mild interstitial prominence. Mild component CHF cannot be excluded. Pneumonitis cannot be excluded. Small bilateral pleural effusions. Surgical clips right upper quadrant. IMPRESSION: Cardiomegaly with mild pulmonary vascular prominence, bilateral interstitial prominence, and small bilateral pleural effusions suggesting mild CHF. Pneumonitis cannot be excluded. Electronically Signed   By: Maisie Fus  Register   On: 01/15/2017 14:23    EKG: Orders placed or performed during the hospital encounter of 01/15/17  . ED EKG  . ED EKG    IMPRESSION AND PLAN:  * Chest pain   Likely secondary to congestive heart failure.    Monitor on telemetry, follow serial troponin, cardiology consult Further workup as per cardiologist.    * Acute diastolic congestive heart failure   IV Lasix, intake and output monitoring, fluid restriction, echo cardiogram.  * History of stroke   Continue aspirin and Plavix and rosuvastatin.  * Diabetes   Keep on insulin sliding scale coverage and hold oral agents for now.  All the records are reviewed and case discussed with ED provider. Management plans discussed with the patient, family and they are in agreement.     CODE STATUS: Full code. Code Status History    This patient does not have a recorded code status. Please follow your organizational policy for patients in this situation.     Patient's husband was present in the room during my visit.  TOTAL TIME TAKING CARE OF THIS PATIENT: 50 minutes.    Altamese Dilling M.D on 01/15/2017   Between 7am to 6pm - Pager - [phone]  After 6pm go to [email or web address] - password [email or web address]  [facility]  Office  [phone]  CC: Primary care physician; Aretha Parrot, MD   Note: This dictation was prepared with Dragon dictation along with smaller phrase technology. Any transcriptional errors that result from this process are  unintentional.

## 2017-01-15 NOTE — ED Notes (Signed)
Patient returned from radiology

## 2017-01-15 NOTE — Plan of Care (Signed)
Problem: Activity: Goal: Capacity to carry out activities will improve Outcome: Progressing Up in room to bathroom with standby assistance without distress.  Continues to diurese

## 2017-01-15 NOTE — ED Triage Notes (Signed)
Pt comes into the ED via EMS from Dr. Fredna Dow office for c/o chest pain that radiates to neck and jaw.  Patient states the pain started Sunday and has caused dizziness and lightheadedness.  Patient alert and oriented x4.  Doctors office believes that there are changes in her EKG compared to normal.

## 2017-01-16 ENCOUNTER — Observation Stay: Payer: BLUE CROSS/BLUE SHIELD

## 2017-01-16 DIAGNOSIS — I639 Cerebral infarction, unspecified: Secondary | ICD-10-CM | POA: Diagnosis not present

## 2017-01-16 DIAGNOSIS — E876 Hypokalemia: Secondary | ICD-10-CM | POA: Diagnosis present

## 2017-01-16 DIAGNOSIS — D649 Anemia, unspecified: Secondary | ICD-10-CM | POA: Diagnosis present

## 2017-01-16 DIAGNOSIS — I359 Nonrheumatic aortic valve disorder, unspecified: Secondary | ICD-10-CM | POA: Diagnosis not present

## 2017-01-16 DIAGNOSIS — I5021 Acute systolic (congestive) heart failure: Secondary | ICD-10-CM

## 2017-01-16 DIAGNOSIS — R778 Other specified abnormalities of plasma proteins: Secondary | ICD-10-CM | POA: Diagnosis present

## 2017-01-16 DIAGNOSIS — I5031 Acute diastolic (congestive) heart failure: Secondary | ICD-10-CM | POA: Diagnosis not present

## 2017-01-16 DIAGNOSIS — R7989 Other specified abnormal findings of blood chemistry: Secondary | ICD-10-CM

## 2017-01-16 DIAGNOSIS — E119 Type 2 diabetes mellitus without complications: Secondary | ICD-10-CM | POA: Diagnosis not present

## 2017-01-16 DIAGNOSIS — R0603 Acute respiratory distress: Secondary | ICD-10-CM | POA: Diagnosis present

## 2017-01-16 DIAGNOSIS — R748 Abnormal levels of other serum enzymes: Secondary | ICD-10-CM

## 2017-01-16 DIAGNOSIS — I350 Nonrheumatic aortic (valve) stenosis: Secondary | ICD-10-CM | POA: Diagnosis not present

## 2017-01-16 DIAGNOSIS — I509 Heart failure, unspecified: Secondary | ICD-10-CM

## 2017-01-16 DIAGNOSIS — I10 Essential (primary) hypertension: Secondary | ICD-10-CM | POA: Diagnosis not present

## 2017-01-16 DIAGNOSIS — I208 Other forms of angina pectoris: Secondary | ICD-10-CM | POA: Diagnosis not present

## 2017-01-16 LAB — FERRITIN: Ferritin: 7 ng/mL — ABNORMAL LOW (ref 11–307)

## 2017-01-16 LAB — BASIC METABOLIC PANEL
ANION GAP: 6 (ref 5–15)
BUN: 8 mg/dL (ref 6–20)
CALCIUM: 8.5 mg/dL — AB (ref 8.9–10.3)
CO2: 24 mmol/L (ref 22–32)
Chloride: 109 mmol/L (ref 101–111)
Creatinine, Ser: 0.6 mg/dL (ref 0.44–1.00)
GFR calc Af Amer: >60 mL/min (ref >60)
GFR calc non Af Amer: >60 mL/min (ref >60)
GLUCOSE: 168 mg/dL — AB (ref 65–99)
Potassium: 3.8 mmol/L (ref 3.5–5.1)
Sodium: 139 mmol/L (ref 135–145)

## 2017-01-16 LAB — FOLATE: Folate: 22 ng/mL (ref >5.9)

## 2017-01-16 LAB — IRON AND TIBC
Iron: 11 ug/dL — ABNORMAL LOW (ref 28–170)
SATURATION RATIOS: 2 % — AB (ref 10.4–31.8)
TIBC: 458 ug/dL — ABNORMAL HIGH (ref 250–450)
UIBC: 447 ug/dL

## 2017-01-16 LAB — GLUCOSE, CAPILLARY
GLUCOSE-CAPILLARY: 156 mg/dL — AB (ref 65–99)
GLUCOSE-CAPILLARY: 191 mg/dL — AB (ref 65–99)
Glucose-Capillary: 159 mg/dL — ABNORMAL HIGH (ref 65–99)
Glucose-Capillary: 141 mg/dL — ABNORMAL HIGH (ref 65–99)

## 2017-01-16 LAB — PREPARE RBC (CROSSMATCH)

## 2017-01-16 LAB — CBC
HEMATOCRIT: 24.1 % — AB (ref 35.0–47.0)
HEMOGLOBIN: 7.6 g/dL — AB (ref 12.0–16.0)
MCH: 22.4 pg — AB (ref 26.0–34.0)
MCHC: 31.8 g/dL — AB (ref 32.0–36.0)
MCV: 70.6 fL — ABNORMAL LOW (ref 80.0–100.0)
Platelets: 112 10*3/uL — ABNORMAL LOW (ref 150–440)
RBC: 3.41 MIL/uL — ABNORMAL LOW (ref 3.80–5.20)
RDW: 18.1 % — ABNORMAL HIGH (ref 11.5–14.5)
WBC: 4.1 10*3/uL (ref 3.6–11.0)

## 2017-01-16 LAB — RETICULOCYTES
RBC.: 3.65 MIL/uL — AB (ref 3.80–5.20)
RETIC CT PCT: 2.6 % (ref 0.4–3.1)
Retic Count, Absolute: 94.9 10*3/uL (ref 19.0–183.0)

## 2017-01-16 LAB — MAGNESIUM: Magnesium: 1.5 mg/dL — ABNORMAL LOW (ref 1.7–2.4)

## 2017-01-16 LAB — ECHOCARDIOGRAM COMPLETE
HEIGHTINCHES: 58 in
WEIGHTICAEL: 2292.78 [oz_av]

## 2017-01-16 LAB — TSH: TSH: 0.812 u[IU]/mL (ref 0.350–4.500)

## 2017-01-16 LAB — TROPONIN I: Troponin I: 0.04 ng/mL (ref <0.03)

## 2017-01-16 LAB — VITAMIN B12: Vitamin B-12: 875 pg/mL (ref 180–914)

## 2017-01-16 LAB — ABO/RH: ABO/RH(D): B POS

## 2017-01-16 MED ORDER — SODIUM CHLORIDE 0.9 % IV SOLN
200.0000 mg | INTRAVENOUS | Status: DC
  Administered 2017-01-16 – 2017-01-17 (×2): 200 mg via INTRAVENOUS
  Filled 2017-01-16 (×2): qty 10

## 2017-01-16 MED ORDER — FUROSEMIDE 10 MG/ML IJ SOLN
40.0000 mg | Freq: Two times a day (BID) | INTRAMUSCULAR | Status: DC
  Administered 2017-01-16: 40 mg via INTRAVENOUS
  Filled 2017-01-16: qty 4

## 2017-01-16 MED ORDER — POTASSIUM CHLORIDE CRYS ER 20 MEQ PO TBCR
20.0000 meq | EXTENDED_RELEASE_TABLET | Freq: Two times a day (BID) | ORAL | Status: DC
  Administered 2017-01-17: 20 meq via ORAL
  Filled 2017-01-16: qty 1

## 2017-01-16 MED ORDER — POTASSIUM CHLORIDE CRYS ER 20 MEQ PO TBCR
40.0000 meq | EXTENDED_RELEASE_TABLET | Freq: Once | ORAL | Status: AC
  Administered 2017-01-16: 40 meq via ORAL
  Filled 2017-01-16: qty 2

## 2017-01-16 MED ORDER — LINAGLIPTIN 5 MG PO TABS
5.0000 mg | ORAL_TABLET | Freq: Every day | ORAL | Status: DC
  Administered 2017-01-17: 5 mg via ORAL
  Filled 2017-01-16: qty 1

## 2017-01-16 MED ORDER — SODIUM CHLORIDE 0.9 % IV SOLN: Freq: Once | INTRAVENOUS | Status: DC

## 2017-01-16 NOTE — Consult Note (Signed)
Cardiology Consultation Note  Patient ID: Jenny [name], MRN: [id number], DOB/AGE: 1946/04/18 71 y.o. Admit date: 01/15/2017   Date of Consult: 01/16/2017 Primary Physician: Corky Downs, MD Primary Cardiologist: New to [facility] - consult by Kirke Corin Requesting Physician: Dr. Elisabeth Pigeon, MD  Chief Complaint: SOB/chest pain Reason for Consult: Same  HPI: Jenny [name] is a 71 y.o. female who is being seen today for the evaluation of SOB/chest pain at the request of Dr. Elisabeth Pigeon, MD. Patient has a h/o prior stroke on ASA and Plavix, prior tobacco abuse quitting 18 years prior, DM, hypothyroidism, and anxiety/depression who was sent [facility] from Dr. Juel Burrow for abnormal EKG (not on file) and chest pain/SOB.  Prior ischemic evaluation in 10/2010 via nuclear stress test that was low risk with normal EF. Patient has noted for approximately the past 2-3 weeks she has been getting more and more fatigued with associated chest pressure and SOB. She has been under increased stress at home with house cleaning and frequent fights with her husband. On 01/12/17 she was cleaning out her closet when seh developed substernal chest pressure that lasted for ~ 30-60 minutes and self resolved. She already had an appointment with Dr. Juel Burrow scheduled for 7/18 so she discussed this with him at that time. He advised inpatient evaluation.   Upon the patient's arrival to [facility] they were found to have BP 102/59, HR 83 bpm, temp 98.2, oxygen saturation 98% on room air, weight 140 pounds. EKG showed NSR, 82 bpm, mild nonspecific anterolateral st depression, CXR showed cardiomegaly with mild pulmonary vascular prominence and small bilateral pleural effusions. Labs showed BNP 1605, hemoglobin 8.4 down trending to 7.6, MCV 71.0, ferritin 7, iron 11, percent saturation 2, serum creatinine 0.72, potassium 3.0 trending to 3.8. Patient was started on IV Lasix 20 mg twice a day with 1.6 L net urine output today. Weight has trended from  140-142 pounds. Echocardiogram pending. Currently, without chest pain. SOB mildly improved.   Past Medical History:  Diagnosis Date  . Anxiety   . Depression   . Diabetes mellitus without complication (HCC)   . Dysrhythmia   . Heart murmur   . Hypothyroidism   . Panic attacks   . Stroke [facility])    2001/ TIA 2006      Most Recent Cardiac Studies: Nuclear stress test 10/2010: IMPRESSION:   Technically good study.  No evidence of reversible ischemia. Good left  ventricular ejection fraction. No wall motion abnormality to suggest  ischemia noted.    Surgical History:  Past Surgical History:  Procedure Laterality Date  . ABDOMINAL HYSTERECTOMY    . CATARACT EXTRACTION W/PHACO Right 04/09/2016   Procedure: CATARACT EXTRACTION PHACO AND INTRAOCULAR LENS PLACEMENT (IOC);  Surgeon: Galen Manila, MD;  Location: [facility] ORS;  Service: Ophthalmology;  Laterality: Right;  [place] 57.2 AP% 18.6 CDE 10.64 Fluid Pack lot # [id number]  . CHOLECYSTECTOMY    . FRACTURE SURGERY     LEFT ANKLE     Home Meds: Prior to Admission medications   Medication Sig Start Date End Date Taking? Authorizing Provider  acetaminophen (TYLENOL) 325 MG tablet Take 650 mg by mouth every 6 (six) hours.   Yes [provider]  ALPRAZolam Prudy Feeler) 0.5 MG tablet Take 0.5 mg by mouth 2 (two) times daily. Take 0.5 mg in the morning and 1 mg at night.   Yes [provider]  clopidogrel (PLAVIX) 75 MG tablet Take 75 mg by mouth daily.   Yes [provider]  DILT-XR 240 MG 24 hr capsule Take 240 mg by mouth daily. 11/22/16  Yes [provider]  diphenhydrAMINE (BENADRYL) 25 MG tablet Take 25 mg by mouth every 6 (six) hours as needed.   Yes [provider]  hydrOXYzine (ATARAX/VISTARIL) 25 MG tablet Take 25 mg by mouth 2 (two) times daily. 12/16/16  Yes [provider]  JANUVIA 100 MG tablet Take 100 mg by mouth daily. 12/20/16  Yes [provider]  LANTUS 100 UNIT/ML  injection Inject 21 Units into the skin daily. 12/23/16  Yes [provider]  levothyroxine (SYNTHROID, LEVOTHROID) 88 MCG tablet Take 88 mcg by mouth daily before breakfast.   Yes [provider]  metFORMIN (GLUCOPHAGE) 500 MG tablet Take 1,000 mg by mouth 2 (two) times daily with a meal.   Yes [provider]  rosuvastatin (CRESTOR) 10 MG tablet Take 10 mg by mouth daily. 11/15/16  Yes [provider]  sertraline (ZOLOFT) 100 MG tablet Take 100 mg by mouth 2 (two) times daily.   Yes [provider]    Inpatient Medications:  . ALPRAZolam  0.5 mg Oral q morning - 10a  . ALPRAZolam  1 mg Oral QHS  . clopidogrel  75 mg Oral Daily  . diltiazem  240 mg Oral Daily  . furosemide  20 mg Intravenous BID  . heparin  5,000 Units Subcutaneous Q8H  . hydrOXYzine  25 mg Oral BID  . insulin aspart  0-9 Units Subcutaneous TID WC  . insulin glargine  21 Units Subcutaneous Daily  . levothyroxine  88 mcg Oral QAC breakfast  . rosuvastatin  10 mg Oral Daily  . sertraline  100 mg Oral BID  . sodium chloride flush  3 mL Intravenous Q12H     Allergies:  Allergies  Allergen Reactions  . Shellfish Allergy Anaphylaxis  . Iodine     BETADINE OKAY    Social History   Social History  . Marital status: Married    Spouse name: N/A  . Number of children: N/A  . Years of education: N/A   Occupational History  . Not on file.   Social History Main Topics  . Smoking status: Former Games developer  . Smokeless tobacco: Never Used  . Alcohol use No  . Drug use: No  . Sexual activity: Not on file   Other Topics Concern  . Not on file   Social History Narrative  . No narrative on file     Family History  Problem Relation Age of Onset  . Hypertension Mother      Review of Systems: Review of Systems  Constitutional: Positive for malaise/fatigue. Negative for chills, diaphoresis, fever and weight loss.  HENT: Negative for congestion.   Eyes: Negative for  discharge and redness.  Respiratory: Positive for shortness of breath. Negative for cough, hemoptysis, sputum production and wheezing.   Cardiovascular: Positive for chest pain. Negative for palpitations, orthopnea, claudication, leg swelling and PND.  Gastrointestinal: Negative for abdominal pain, blood in stool, heartburn, melena, nausea and vomiting.  Genitourinary: Negative for hematuria.  Musculoskeletal: Negative for falls and myalgias.  Skin: Negative for rash.  Neurological: Positive for weakness. Negative for dizziness, tingling, tremors, sensory change, speech change, focal weakness and loss of consciousness.  Endo/Heme/Allergies: Does not bruise/bleed easily.  Psychiatric/Behavioral: Negative for substance abuse. The patient is not nervous/anxious.   All other systems reviewed and are negative.   Labs:  Recent Labs  01/15/17 1353 01/15/17 1820 01/15/17 2234 01/16/17 6270  TROPONINI  0.07* 0.05* 0.05* 0.04*   Lab Results  Component Value Date   WBC 4.1 01/16/2017   HGB 7.6 (L) 01/16/2017   HCT 24.1 (L) 01/16/2017   MCV 70.6 (L) 01/16/2017   PLT 112 (L) 01/16/2017     Recent Labs Lab 01/16/17 0218  NA 139  K 3.8  CL 109  CO2 24  BUN 8  CREATININE 0.60  CALCIUM 8.5*  GLUCOSE 168*   Lab Results  Component Value Date   CHOL 92 01/15/2017   HDL 36 (L) 01/15/2017   LDLCALC 37 01/15/2017   TRIG 97 01/15/2017   No results found for: DDIMER  Radiology/Studies:  Dg Chest 2 View  Result Date: 01/16/2017 IMPRESSION: Continued CHF findings without interval improvement. Electronically Signed   By: Marnee Spring M.D.   On: 01/16/2017 08:12   Dg Chest 2 View  Result Date: 01/15/2017 IMPRESSION: Cardiomegaly with mild pulmonary vascular prominence, bilateral interstitial prominence, and small bilateral pleural effusions suggesting mild CHF. Pneumonitis cannot be excluded. Electronically Signed   By: Maisie Fus  Register   On: 01/15/2017 14:23    EKG: Interpreted  by me showed: NSR, 82 bpm, mild nonspecific anterolateral st depression Telemetry: Interpreted by me showed: NSR  Weights: Filed Weights   01/15/17 1345 01/15/17 1814 01/16/17 0406  Weight: 140 lb (63.5 kg) 143 lb 4.8 oz (65 kg) 142 lb 14.4 oz (64.8 kg)     Physical Exam: Blood pressure (!) 102/53, pulse (!) 103, temperature 98.6 F (37 C), temperature source Oral, resp. rate 14, height 4\' 10"  (1.473 m), weight 142 lb 14.4 oz (64.8 kg), SpO2 94 %. Body mass index is 29.87 kg/m. General: Well developed, well nourished, in no acute distress. Head: Normocephalic, atraumatic, sclera non-icteric, no xanthomas, nares are without discharge.  Neck: Negative for carotid bruits. JVD elevated ~ 8 cm. Lungs: Bibasilar crackles. Breathing is unlabored. Heart: RRR with S1 S2. III/VI systolic murmur RUSB, no rubs, or gallops appreciated. Abdomen: Soft, non-tender, non-distended with normoactive bowel sounds. No hepatomegaly. No rebound/guarding. No obvious abdominal masses. Msk:  Strength and tone appear normal for age. Extremities: No clubbing or cyanosis. No edema. Distal pedal pulses are 2+ and equal bilaterally. Neuro: Alert and oriented X 3. No facial asymmetry. No focal deficit. Moves all extremities spontaneously. Psych:  Responds to questions appropriately with a normal affect.    Assessment and Plan:  Principal Problem:   Acute respiratory distress Active Problems:   Anemia   Acute CHF (congestive heart failure) (HCC)   Elevated troponin   Hypokalemia   Chest pain    1. Acute respiratory distress with hypoxia: -Maintaining adequate oxygen saturation on room air at this time -Likely multifactorial including symptomatic anemia with hemoglobin down trending to 7.6 as well as acute CHF (type unknown)  2. Acute CHF: -She remains volume overloaded -Await transthoracic echocardiogram -Increase IV diuresis Lasix to 40 mg bid along with KCl repletion -If found to have reduced EF will  need inpatient ischemic evaluation, though would also need to demonstrate stable hemoglobin  3. Chest pain/elevated troponin: -Currently, chest pain free -Mild troponin elevation with a peak of 0.07 now down trending likely secondary to supply demand ischemia in the setting of her acute respiratory distress with hypoxia secondary to volume overload and symptomatic anemia -Has been under increased stress lately with house projects and frequent fights with her husband -Check transthoracic echocardiogram as above -May need ischemic evaluation once her acute anemia is resolved and stable -Hold aspirin and Plavix for  now given continued decline in hemoglobin  3. Symptomatic anemia: -Recommend transfusion to hemoglobin at least 8.5, possibly 10 -Hold aspirin and Plavix -Will defer to internal medicine  4. Hypokalemia: -Replete to goal 4.0 -Check magnesium  5. Prior stroke/TIA: -Followed by PCP -Aspirin/Plavix on hold as above  6. Cardiac murmur: -Await TTE   Signed, Eula Listen, PA-C [facility] Pager: [phone] 01/16/2017, 11:03 AM

## 2017-01-16 NOTE — Care Management Obs Status (Signed)
MEDICARE OBSERVATION STATUS NOTIFICATION   Patient Details  Name: SIMRIN VEGH MRN: [id number] Date of Birth: [birth year]   Medicare Observation Status Notification Given:  Yes    Eber Hong, RN 01/16/2017, 2:51 PM

## 2017-01-16 NOTE — Progress Notes (Signed)
[employer] [facility] [place] [facility]   PATIENT NAME: Jenny [name]    MR#:  [id number]  DATE OF BIRTH:  [birth year]  SUBJECTIVE:  CHIEF COMPLAINT:   Chief Complaint  Patient presents with  . Chest Pain   -Complains of shortness of breath going on for almost 4 weeks now, worsened in the last couple of days. -X-ray on admission showing congestive heart failure. Currently on IV Lasix -Also low hemoglobin noted.  REVIEW OF SYSTEMS:  Review of Systems  Constitutional: Positive for malaise/fatigue. Negative for chills and fever.  HENT: Negative for congestion, ear discharge, hearing loss and nosebleeds.   Eyes: Negative for blurred vision.  Respiratory: Positive for shortness of breath. Negative for cough and wheezing.   Cardiovascular: Negative for chest pain, palpitations and leg swelling.  Gastrointestinal: Negative for abdominal pain, constipation, diarrhea, nausea and vomiting.  Genitourinary: Negative for dysuria.  Musculoskeletal: Negative for myalgias.  Neurological: Negative for dizziness, sensory change, speech change, focal weakness, seizures and headaches.  Psychiatric/Behavioral: The patient is nervous/anxious.     DRUG ALLERGIES:   Allergies  Allergen Reactions  . Shellfish Allergy Anaphylaxis  . Iodine     BETADINE OKAY    VITALS:  Blood pressure (!) 109/48, pulse (!) 106, temperature 97.7 F (36.5 C), temperature source Oral, resp. rate 16, height 4\' 10"  (1.473 m), weight 64.8 kg (142 lb 14.4 oz), SpO2 93 %.  PHYSICAL EXAMINATION:  Physical Exam  GENERAL:  71 y.o.-year-old patient lying in the bed with no acute distress.  EYES: Pupils equal, round, reactive to light and accommodation. No scleral icterus. Extraocular muscles intact.  HEENT: Head atraumatic, normocephalic. Oropharynx and nasopharynx clear.  NECK:  Supple, no jugular venous distention. No thyroid enlargement, no tenderness.  LUNGS: Normal breath sounds bilaterally, no  wheezing, rhonchi or crepitation. No use of accessory muscles of respiration. Bibasilar crackles heard CARDIOVASCULAR: S1, S2 normal. No  rubs, or gallops. 3/6 systolic murmur is present ABDOMEN: Soft, nontender, nondistended. Bowel sounds present. No organomegaly or mass.  EXTREMITIES: No pedal edema, cyanosis, or clubbing.  NEUROLOGIC: Cranial nerves II through XII are intact. Muscle strength 5/5 in all extremities. Sensation intact. Gait not checked.  PSYCHIATRIC: The patient is alert and oriented x 3. Anxious SKIN: No obvious rash, lesion, or ulcer.    LABORATORY PANEL:   CBC  Recent Labs Lab 01/16/17 0218  WBC 4.1  HGB 7.6*  HCT 24.1*  PLT 112*   ------------------------------------------------------------------------------------------------------------------  Chemistries   Recent Labs Lab 01/16/17 0218  NA 139  K 3.8  CL 109  CO2 24  GLUCOSE 168*  BUN 8  CREATININE 0.60  CALCIUM 8.5*  MG 1.5*   ------------------------------------------------------------------------------------------------------------------  Cardiac Enzymes  Recent Labs Lab 01/16/17 0218  TROPONINI 0.04*   ------------------------------------------------------------------------------------------------------------------  RADIOLOGY:  Dg Chest 2 View  Result Date: 01/16/2017 CLINICAL DATA:  CHF EXAM: CHEST  2 VIEW COMPARISON:  Yesterday FINDINGS: Persistent cephalized blood flow, interstitial coarsening, and trace pleural fluid. Normal heart size and stable mediastinal contours. No air bronchograms. IMPRESSION: Continued CHF findings without interval improvement. Electronically Signed   By: 01/18/2017 M.D.   On: 01/16/2017 08:12   Dg Chest 2 View  Result Date: 01/15/2017 CLINICAL DATA:  Chest pain. EXAM: CHEST  2 VIEW COMPARISON:  10/22/2014 . FINDINGS: Mediastinum and hilar structures normal. Mild cardiomegaly and venous congestion. Mild interstitial prominence. Mild component CHF  cannot be excluded. Pneumonitis cannot be excluded. Small bilateral pleural effusions. Surgical clips right upper  quadrant. IMPRESSION: Cardiomegaly with mild pulmonary vascular prominence, bilateral interstitial prominence, and small bilateral pleural effusions suggesting mild CHF. Pneumonitis cannot be excluded. Electronically Signed   By: Maisie Fus  Register   On: 01/15/2017 14:23    EKG:   Orders placed or performed during the hospital encounter of 01/15/17  . ED EKG  . ED EKG    ASSESSMENT AND PLAN:   71 year old female with past medical history significant for history of prior stroke, diabetes, hypertension, hypothyroidism, anxiety and depression sent in for worsening shortness of breath.  #1 acute congestive heart failure-echocardiogram is pending at this time. -Chest x-ray with vascular congestion and pulmonary edema. -Continue IV Lasix twice a day at this time. If low EF noted, might need inpatient ischemic evaluation. -Currently requiring 2 L oxygen, wean as tolerated as not on home oxygen. -Cardiology consulted.  #2 acute on chronic anemia-Baseline hemoglobin noted to be around 10 -Steady drop has been noted. Patient denies any bleeding. Anemia labs ordered. -Due to her cardiac symptoms, we'll transfuse 1 unit to keep the hemoglobin greater than 8.5. -Lasix between transfusions - no use of NSAIDS - hold asa and plavix  #3 diabetes mellitus-takes metformin and Januvia at home. Both medications on hold at this time. -Started on sliding scale insulin and Tradjenta started  #4 hypertension-on Cardizem orally  #5 hypothyroidism-continue Synthroid  #6 DVT prophylaxis-on subcutaneous heparin.    All the records are reviewed and case discussed with Care Management/Social Workerr. Management plans discussed with the patient, family and they are in agreement.  CODE STATUS: Full code  TOTAL TIME TAKING CARE OF THIS PATIENT: 38 minutes.   POSSIBLE D/C IN 2 DAYS, DEPENDING ON  CLINICAL CONDITION.   Enid Baas M.D on 01/16/2017 at 2:02 PM  Between 7am to 6pm - Pager - [phone]  After 6pm go to [email or web address] - Social research officer, government  [facility]  Office  [phone]  CC: Primary care physician; Corky Downs, MD

## 2017-01-16 NOTE — Progress Notes (Signed)
Hgb drop.  Patient reports that her stools are not dark or black.

## 2017-01-16 NOTE — Progress Notes (Signed)
While discontinuing the venofer patient told me the site really itched and commented on how swollen it was.  A large amount of the venofer had infiltrated.  The IV flushed easily at the start of the administration.  The IV was dc'd.  I spoke to pharmacy and they recommended cold compresses to reduce the swelling. It will also help with the itching

## 2017-01-17 DIAGNOSIS — I639 Cerebral infarction, unspecified: Secondary | ICD-10-CM | POA: Diagnosis not present

## 2017-01-17 DIAGNOSIS — I208 Other forms of angina pectoris: Secondary | ICD-10-CM | POA: Diagnosis not present

## 2017-01-17 DIAGNOSIS — E119 Type 2 diabetes mellitus without complications: Secondary | ICD-10-CM | POA: Diagnosis not present

## 2017-01-17 DIAGNOSIS — I5031 Acute diastolic (congestive) heart failure: Secondary | ICD-10-CM | POA: Diagnosis not present

## 2017-01-17 DIAGNOSIS — I10 Essential (primary) hypertension: Secondary | ICD-10-CM | POA: Diagnosis not present

## 2017-01-17 DIAGNOSIS — I350 Nonrheumatic aortic (valve) stenosis: Secondary | ICD-10-CM

## 2017-01-17 DIAGNOSIS — D649 Anemia, unspecified: Secondary | ICD-10-CM | POA: Diagnosis not present

## 2017-01-17 LAB — BPAM RBC
Blood Product Expiration Date: 201808022359
ISSUE DATE / TIME: 201807192046
Unit Type and Rh: 7300

## 2017-01-17 LAB — HEMOGLOBIN A1C
Hgb A1c MFr Bld: 8.9 % — ABNORMAL HIGH (ref 4.8–5.6)
MEAN PLASMA GLUCOSE: 209 mg/dL

## 2017-01-17 LAB — BASIC METABOLIC PANEL
Anion gap: 9 (ref 5–15)
BUN: 9 mg/dL (ref 6–20)
CHLORIDE: 107 mmol/L (ref 101–111)
CO2: 21 mmol/L — AB (ref 22–32)
CREATININE: 0.71 mg/dL (ref 0.44–1.00)
Calcium: 8.4 mg/dL — ABNORMAL LOW (ref 8.9–10.3)
GFR calc non Af Amer: >60 mL/min (ref >60)
GLUCOSE: 207 mg/dL — AB (ref 65–99)
Potassium: 4.2 mmol/L (ref 3.5–5.1)
Sodium: 137 mmol/L (ref 135–145)

## 2017-01-17 LAB — GLUCOSE, CAPILLARY
Glucose-Capillary: 179 mg/dL — ABNORMAL HIGH (ref 65–99)
Glucose-Capillary: 251 mg/dL — ABNORMAL HIGH (ref 65–99)

## 2017-01-17 LAB — TYPE AND SCREEN
ABO/RH(D): B POS
Antibody Screen: NEGATIVE
Unit division: 0

## 2017-01-17 LAB — CBC
HEMATOCRIT: 31.1 % — AB (ref 35.0–47.0)
HEMOGLOBIN: 10.2 g/dL — AB (ref 12.0–16.0)
MCH: 24.3 pg — AB (ref 26.0–34.0)
MCHC: 32.7 g/dL (ref 32.0–36.0)
MCV: 74.2 fL — AB (ref 80.0–100.0)
Platelets: 131 10*3/uL — ABNORMAL LOW (ref 150–440)
RBC: 4.2 MIL/uL (ref 3.80–5.20)
RDW: 18.4 % — ABNORMAL HIGH (ref 11.5–14.5)
WBC: 6.1 10*3/uL (ref 3.6–11.0)

## 2017-01-17 MED ORDER — LOSARTAN POTASSIUM 25 MG PO TABS: 25.0000 mg | ORAL_TABLET | Freq: Every day | ORAL | 2 refills | Status: DC

## 2017-01-17 MED ORDER — FUROSEMIDE 40 MG PO TABS
40.0000 mg | ORAL_TABLET | Freq: Every day | ORAL | Status: DC
  Administered 2017-01-17: 40 mg via ORAL
  Filled 2017-01-17: qty 1

## 2017-01-17 MED ORDER — LOPERAMIDE HCL 2 MG PO CAPS
2.0000 mg | ORAL_CAPSULE | Freq: Four times a day (QID) | ORAL | Status: DC | PRN
  Administered 2017-01-17: 2 mg via ORAL
  Filled 2017-01-17: qty 1

## 2017-01-17 MED ORDER — FERROUS SULFATE 325 (65 FE) MG PO TABS: 325.0000 mg | ORAL_TABLET | Freq: Two times a day (BID) | ORAL | 3 refills | Status: DC

## 2017-01-17 MED ORDER — FUROSEMIDE 40 MG PO TABS: 40.0000 mg | ORAL_TABLET | Freq: Every day | ORAL | 2 refills | Status: DC

## 2017-01-17 MED ORDER — ACETAMINOPHEN 325 MG PO TABS: 650.0000 mg | ORAL_TABLET | Freq: Four times a day (QID) | ORAL | 0 refills | Status: DC | PRN

## 2017-01-17 NOTE — Progress Notes (Signed)
    Patient to be discharged today. Will need outpatient cardiology follow up to discuss [facility] cath given mild cardiomyopathy with WMA and severe AS. Given her cardiomyopathy, would stop Cardizem and place on Toprol XL. Recommend adding low-dose losartan 25 mg daily. Consider addition of spironolactone at hospital follow up.

## 2017-01-17 NOTE — Discharge Instructions (Signed)
Heart Failure Clinic appointment on January 29 2017 at 8:20am with Clarisa Kindred, FNP. Please call [phone] to reschedule.

## 2017-01-17 NOTE — Care Management (Addendum)
Patient now with diagnosis of new CHF.  She does not have access to scales so CM provided them.  Have requested an appointment for Heart Failure Clinic.  Patient does not meet homebound criteria for home health services. Heart failure Clinic follow up should meet needs.   Patient denies issues obtaining medications.  Is current with her PCP Dr Juel Burrow. No issues with transportation that would prevent her from keeping her appointments.

## 2017-01-17 NOTE — Evaluation (Signed)
Physical Therapy Evaluation Patient Details Name: Jenny [name] MRN: [id number] DOB: 10-01-1945 Today's Date: 01/17/2017   History of Present Illness  Pt is a 71 y.o. female presenting to hospital with chest pain 01/15/17 and SOB that was increasing last couple days.  Pt admitted to hospital with acute diastolic CHF.  PMH includes stroke 2001, TIA 2006, htn, DM, anxiety, panic attacks, and L ankle fx surgery.  Clinical Impression  Prior to hospital admission, pt was independent.  Pt lives with her husband on main level of home with 4-5 steps to enter with B railings.  Currently pt is SBA with bed mobility; SBA with transfers; SBA to CGA with ambulation in hallway without UE support (intermittent mild antalgic gait noted d/t 2/10 R knee pain); and CGA navigating 3 stairs with railing.  HR 101-111 bpm during session with mild SOB with activity.  Pt would benefit from skilled PT to address noted impairments and functional limitations (see below for any additional details) during hospital stay (no further PT needs anticipated upon hospital discharge).  Upon hospital discharge, recommend pt discharge to home with support of family.    Follow Up Recommendations No PT follow up    Equipment Recommendations  None recommended by PT    Recommendations for Other Services       Precautions / Restrictions Precautions Precautions: Fall Restrictions Weight Bearing Restrictions: No      Mobility  Bed Mobility Overal bed mobility: Modified Independent             General bed mobility comments: Supine to/from sit with HOB elevated with mild increased effort  Transfers Overall transfer level: Needs assistance Equipment used: None Transfers: Sit to/from Stand;Stand Pivot Transfers Sit to Stand: Supervision Stand pivot transfers: Supervision       General transfer comment: mild antalgic stepping noted with R LE but overall steady  Ambulation/Gait Ambulation/Gait assistance: Min  guard;Supervision Ambulation Distance (Feet): 240 Feet Assistive device: None       General Gait Details: mild antalgic stepping pattern noted with R LE intermittently; decreased cadence; mild general unsteadiness at times d/t intermittent mild antalgic gait but no overt loss of balance noted  Stairs Stairs: Yes Stairs assistance: Min guard Stair Management: One rail Right;Step to pattern;Forwards Number of Stairs: 3 General stair comments: steady; limited number of stairs d/t short IV line  Wheelchair Mobility    Modified Rankin (Stroke Patients Only)       Balance Overall balance assessment: Needs assistance;History of Falls Sitting-balance support: No upper extremity supported;Feet supported Sitting balance-Leahy Scale: Good Sitting balance - Comments: sitting reaching within BOS     Standing balance-Leahy Scale: Good Standing balance comment: standing without UE support reaching within BOS                             Pertinent Vitals/Pain Pain Assessment: 0-10 Pain Score: 2  Pain Location: R knee with ambulation (none at rest) Pain Descriptors / Indicators: Sore Pain Intervention(s): Limited activity within patient's tolerance;Monitored during session;Repositioned    Home Living Family/patient expects to be discharged to:: Private residence Living Arrangements: Spouse/significant other Available Help at Discharge: Family Type of Home: House Home Access: Stairs to enter Entrance Stairs-Rails: Right;Left;Can reach both   Home Layout: Two level;Able to live on main level with bedroom/bathroom (Stays on main floor) Home Equipment: Walker - 2 wheels;Cane - single point;Grab bars - tub/shower      Prior Function Level of Independence:  Independent         Comments: Pt reports 3-4 falls in past 6 months d/t "just losing my balance" but no specific reason for falls noted ("just happens")     Hand Dominance        Extremity/Trunk Assessment    Upper Extremity Assessment Upper Extremity Assessment: Generalized weakness    Lower Extremity Assessment Lower Extremity Assessment: Generalized weakness    Cervical / Trunk Assessment Cervical / Trunk Assessment: Normal  Communication   Communication: No difficulties  Cognition Arousal/Alertness: Awake/alert Behavior During Therapy: Anxious (Verbose) Overall Cognitive Status: Within Functional Limits for tasks assessed                                        General Comments General comments (skin integrity, edema, etc.): Pt's husband briefly came during session but left again shortly after arriving.  Nursing cleared pt for participation in physical therapy.  Pt agreeable to PT session.    Exercises     Assessment/Plan    PT Assessment Patient needs continued PT services  PT Problem List Decreased strength;Decreased balance;Decreased mobility       PT Treatment Interventions DME instruction;Gait training;Stair training;Therapeutic exercise;Balance training;Functional mobility training;Therapeutic activities;Patient/family education    PT Goals (Current goals can be found in the Care Plan section)  Acute Rehab PT Goals Patient Stated Goal: to go home PT Goal Formulation: With patient Time For Goal Achievement: 01/31/17 Potential to Achieve Goals: Good    Frequency Min 2X/week   Barriers to discharge        Co-evaluation               AM-PAC PT "6 Clicks" Daily Activity  Outcome Measure Difficulty turning over in bed (including adjusting bedclothes, sheets and blankets)?: A Little Difficulty moving from lying on back to sitting on the side of the bed? : A Little Difficulty sitting down on and standing up from a chair with arms (e.g., wheelchair, bedside commode, etc,.)?: A Little Help needed moving to and from a bed to chair (including a wheelchair)?: A Little Help needed walking in hospital room?: A Little Help needed climbing 3-5 steps  with a railing? : A Little 6 Click Score: 18    End of Session Equipment Utilized During Treatment: Gait belt Activity Tolerance: Patient tolerated treatment well Patient left: in bed;with call bell/phone within reach;with bed alarm set Nurse Communication: Mobility status;Precautions PT Visit Diagnosis: Other abnormalities of gait and mobility (R26.89);Muscle weakness (generalized) (M62.81);History of falling (Z91.81)    Time: 8264-1583 PT Time Calculation (min) (ACUTE ONLY): 28 min   Charges:   PT Evaluation $PT Eval Low Complexity: 1 Procedure     PT G Codes:   PT G-Codes **NOT FOR INPATIENT CLASS** Functional Assessment Tool Used: AM-PAC 6 Clicks Basic Mobility Functional Limitation: Mobility: Walking and moving around Mobility: Walking and Moving Around Current Status (E9407): At least 40 percent but less than 60 percent impaired, limited or restricted Mobility: Walking and Moving Around Goal Status [phone]): At least 1 percent but less than 20 percent impaired, limited or restricted    Hendricks Limes, PT 01/17/17, 3:06 PM [phone]

## 2017-01-17 NOTE — Progress Notes (Signed)
Progress Note  Patient Name: Jenny [name] Date of Encounter: 01/17/2017  Primary Cardiologist: new Kirke Corin)  Subjective   She reports significant improvement in shortness of breath. She feels back to baseline.  Inpatient Medications    Scheduled Meds: . ALPRAZolam  0.5 mg Oral q morning - 10a  . ALPRAZolam  1 mg Oral QHS  . diltiazem  240 mg Oral Daily  . furosemide  40 mg Oral Daily  . heparin  5,000 Units Subcutaneous Q8H  . hydrOXYzine  25 mg Oral BID  . insulin aspart  0-9 Units Subcutaneous TID WC  . insulin glargine  21 Units Subcutaneous Daily  . levothyroxine  88 mcg Oral QAC breakfast  . linagliptin  5 mg Oral Daily  . potassium chloride  20 mEq Oral BID  . rosuvastatin  10 mg Oral Daily  . sertraline  100 mg Oral BID  . sodium chloride flush  3 mL Intravenous Q12H   Continuous Infusions: . sodium chloride    . iron sucrose Stopped (01/16/17 1842)   PRN Meds: acetaminophen, diphenhydrAMINE, docusate sodium, loperamide   Vital Signs    Vitals:   01/17/17 0348 01/17/17 0420 01/17/17 0925 01/17/17 1323  BP: (!) 101/49  (!) 110/45 (!) 97/46  Pulse: 93   (!) 101  Resp: 18  19 20   Temp: 99.3 F (37.4 C)   98.3 F (36.8 C)  TempSrc: Oral   Oral  SpO2: 97%  97% 95%  Weight:  139 lb 1.6 oz (63.1 kg)    Height:        Intake/Output Summary (Last 24 hours) at 01/17/17 1335 Last data filed at 01/17/17 1334  Gross per 24 hour  Intake              690 ml  Output              900 ml  Net             -210 ml   Filed Weights   01/15/17 1814 01/16/17 0406 01/17/17 0420  Weight: 143 lb 4.8 oz (65 kg) 142 lb 14.4 oz (64.8 kg) 139 lb 1.6 oz (63.1 kg)    Telemetry    Normal sinus rhythm with sinus tachycardia. - Personally Reviewed  ECG    Not done today.  Physical Exam   GEN: No acute distress.   Neck: No JVD Cardiac: RRR, no  rubs, or gallops. 3/6 crescendo decrescendo systolic murmur in the aortic area which is mid to late peaking. Respiratory:  Clear to auscultation bilaterally. GI: Soft, nontender, non-distended  MS: No edema; No deformity. Neuro:  Nonfocal  Psych: Normal affect   Labs    Chemistry Recent Labs Lab 01/15/17 1353 01/16/17 0218 01/17/17 0647  NA 137 139 137  K 3.0* 3.8 4.2  CL 103 109 107  CO2 25 24 21*  GLUCOSE 103* 168* 207*  BUN 7 8 9   CREATININE 0.72 0.60 0.71  CALCIUM 8.8* 8.5* 8.4*  GFRNONAA >60 >60 >60  GFRAA >60 >60 >60  ANIONGAP 9 6 9      Hematology Recent Labs Lab 01/15/17 1353 01/16/17 0218 01/16/17 0921 01/17/17 0647  WBC 6.5 4.1  --  6.1  RBC 3.71* 3.41* 3.65* 4.20  HGB 8.4* 7.6*  --  10.2*  HCT 26.3* 24.1*  --  31.1*  MCV 71.0* 70.6*  --  74.2*  MCH 22.6* 22.4*  --  24.3*  MCHC 31.8* 31.8*  --  32.7  RDW  18.2* 18.1*  --  18.4*  PLT 134* 112*  --  131*    Cardiac Enzymes Recent Labs Lab 01/15/17 1353 01/15/17 1820 01/15/17 2234 01/16/17 0218  TROPONINI 0.07* 0.05* 0.05* 0.04*   No results for input(s): TROPIPOC in the last 168 hours.   BNP Recent Labs Lab 01/15/17 1353  BNP 1,605.0*     DDimer No results for input(s): DDIMER in the last 168 hours.   Radiology    Dg Chest 2 View  Result Date: 01/16/2017 CLINICAL DATA:  CHF EXAM: CHEST  2 VIEW COMPARISON:  Yesterday FINDINGS: Persistent cephalized blood flow, interstitial coarsening, and trace pleural fluid. Normal heart size and stable mediastinal contours. No air bronchograms. IMPRESSION: Continued CHF findings without interval improvement. Electronically Signed   By: Marnee Spring M.D.   On: 01/16/2017 08:12   Dg Chest 2 View  Result Date: 01/15/2017 CLINICAL DATA:  Chest pain. EXAM: CHEST  2 VIEW COMPARISON:  10/22/2014 . FINDINGS: Mediastinum and hilar structures normal. Mild cardiomegaly and venous congestion. Mild interstitial prominence. Mild component CHF cannot be excluded. Pneumonitis cannot be excluded. Small bilateral pleural effusions. Surgical clips right upper quadrant. IMPRESSION:  Cardiomegaly with mild pulmonary vascular prominence, bilateral interstitial prominence, and small bilateral pleural effusions suggesting mild CHF. Pneumonitis cannot be excluded. Electronically Signed   By: Maisie Fus  Register   On: 01/15/2017 14:23    Cardiac Studies   Echocardiogram from yesterday: - Left ventricle: The cavity size was normal. Systolic function was   mildly reduced. The estimated ejection fraction was in the range   of 45% to 50%. Probable hypokinesis of the   mid-apicalanteroseptal, anterior, and apical myocardium. Features   are consistent with a pseudonormal left ventricular filling   pattern, with concomitant abnormal relaxation and increased   filling pressure (grade 2 diastolic dysfunction). - Aortic valve: Possibly bicuspid; moderately thickened, severely   calcified leaflets. There was severe stenosis. Mean gradient (S):   47 mm Hg. Valve area (VTI): 0.52 cm^2. - Mitral valve: There was mild regurgitation. - Left atrium: The atrium was mildly dilated.  Patient Profile     71 y.o. female with prior history of stroke, previous tobacco use, diabetes mellitus and hypothyroidism who presented with aggressive shortness of breath and exertional chest pain and was found to be in heart failure with significant anemia with a hemoglobin of 8.4. Echocardiogram showed possibly bicuspid aortic valve with severe stenosis and mildly reduced ejection fraction.  Assessment & Plan    1. Acute systolic heart failure with mildly reduced LV systolic function: Possibly due to underlying ischemic cardiomyopathy given wall motion abnormalities. In addition, the patient has what seems to be bicuspid aortic valve with severe aortic stenosis. She is symptomatic and thus there is indication for aortic valve replacement. The patient improved quickly with transfusion and gentle diuresis. She can be discharged home on small dose furosemide. Recommend switching diltiazem to metoprolol 25 mg twice  daily. I'm going to make arrangements for her to follow-up in our office next week with plans for a right and left cardiac catheterization.  2. Anemia: In the setting of excessive aspirin use and being on Plavix: Improved with transfusion. She might not be a good candidate for endoscopic procedures until her cardiac status is addressed. Recommend iron treatment to improve her anemia.  Signed, Lorine Bears, MD  01/17/2017, 1:35 PM

## 2017-01-17 NOTE — Discharge Summary (Addendum)
[facility] [place] [facility]   PATIENT NAME: Jenny [name]    MR#:  [id number]  DATE OF BIRTH:  1945/08/06  DATE OF ADMISSION:  01/15/2017   ADMITTING PHYSICIAN: Altamese Dilling, MD  DATE OF DISCHARGE: 01/17/2017  3:10 PM  PRIMARY CARE PHYSICIAN: Corky Downs, MD   ADMISSION DIAGNOSIS:   CHF (congestive heart failure) (HCC) [I50.9] NSTEMI (non-ST elevated myocardial infarction) (HCC) [I21.4]  DISCHARGE DIAGNOSIS:   Principal Problem:   Acute respiratory distress Active Problems:   Chest pain   Anemia   Acute CHF (congestive heart failure) (HCC)   Elevated troponin   Hypokalemia   SECONDARY DIAGNOSIS:   Past Medical History:  Diagnosis Date  . Anxiety   . Depression   . Diabetes mellitus without complication (HCC)   . Dysrhythmia   . Heart murmur   . Hypothyroidism   . Panic attacks   . Stroke [facility])    2001/ TIA 2006    HOSPITAL COURSE:   71 year old female with past medical history significant for history of prior stroke, diabetes, hypertension, hypothyroidism, anxiety and depression sent in for worsening shortness of breath.  #1 acute congestive heart failure- Acute systolic CHF exacerbation. -Echocardiogram with EF of 45-50% with some hypokinesis of anterior lead and anteroseptal walls. -Also has severe aortic stenosis. Appreciate cardiology consult. -Received Lasix IV in the hospital with much improvement in her dyspnea. Being discharged on low-dose oral Lasix. Salt restriction, daily weights and fluid restriction recommended. -Outpatient workup for ischemic and recommended. -started on low-dose losartan,  - cardizem can be changed to coreg as outpatient - NSTEMI ruled out  #2 acute on chronic anemia-Baseline hemoglobin noted to be around 10 -Steady drop has been noted. Patient denies any bleeding. Anemia labs ordered. -Due to her cardiac symptoms, patient received 1 unit of transfusion and hemoglobin has improved to  10 at discharge. - no use of NSAIDS - Discontinue Plavix at the time of discharge is no stents history -Aspirin can be restarted as outpatient.  #3 diabetes mellitus-restarted home medications. Patient on Januvia, metformin and Lantus  #4 hypertension-on Cardizem orally  #5 hypothyroidism-continue Synthroid  Patient will be discharged home today   DISCHARGE CONDITIONS:   Guarded  CONSULTS OBTAINED:   Treatment Team:  Iran Ouch, MD  DRUG ALLERGIES:   Allergies  Allergen Reactions  . Shellfish Allergy Anaphylaxis  . Iodine     BETADINE OKAY   DISCHARGE MEDICATIONS:   Allergies as of 01/17/2017      Reactions   Shellfish Allergy Anaphylaxis   Iodine    BETADINE OKAY      Medication List    STOP taking these medications   clopidogrel 75 MG tablet Commonly known as:  PLAVIX     TAKE these medications   acetaminophen 325 MG tablet Commonly known as:  TYLENOL Take 2 tablets (650 mg total) by mouth every 6 (six) hours as needed for mild pain, fever or headache. What changed:  when to take this  reasons to take this   ALPRAZolam 0.5 MG tablet Commonly known as:  XANAX Take 0.5 mg by mouth 2 (two) times daily. Take 0.5 mg in the morning and 1 mg at night.   DILT-XR 240 MG 24 hr capsule Generic drug:  diltiazem Take 240 mg by mouth daily.   diphenhydrAMINE 25 MG tablet Commonly known as:  BENADRYL Take 25 mg by mouth every 6 (six) hours as needed.   ferrous sulfate 325 (65 FE) MG  tablet Take 1 tablet (325 mg total) by mouth 2 (two) times daily with a meal.   furosemide 40 MG tablet Commonly known as:  LASIX Take 1 tablet (40 mg total) by mouth daily.   hydrOXYzine 25 MG tablet Commonly known as:  ATARAX/VISTARIL Take 25 mg by mouth 2 (two) times daily.   JANUVIA 100 MG tablet Generic drug:  sitaGLIPtin Take 100 mg by mouth daily.   LANTUS 100 UNIT/ML injection Generic drug:  insulin glargine Inject 21 Units into the skin daily.     levothyroxine 88 MCG tablet Commonly known as:  SYNTHROID, LEVOTHROID Take 88 mcg by mouth daily before breakfast.   losartan 25 MG tablet Commonly known as:  COZAAR Take 1 tablet (25 mg total) by mouth daily.   metFORMIN 500 MG tablet Commonly known as:  GLUCOPHAGE Take 1,000 mg by mouth 2 (two) times daily with a meal.   rosuvastatin 10 MG tablet Commonly known as:  CRESTOR Take 10 mg by mouth daily.   sertraline 100 MG tablet Commonly known as:  ZOLOFT Take 100 mg by mouth 2 (two) times daily.        DISCHARGE INSTRUCTIONS:   1. PCP follow-up in 1-2 weeks  DIET:   Cardiac diet  ACTIVITY:   Activity as tolerated  OXYGEN:   Home Oxygen: No.  Oxygen Delivery: room air  DISCHARGE LOCATION:   home   If you experience worsening of your admission symptoms, develop shortness of breath, life threatening emergency, suicidal or homicidal thoughts you must seek medical attention immediately by calling 911 or calling your MD immediately  if symptoms less severe.  You Must read complete instructions/literature along with all the possible adverse reactions/side effects for all the Medicines you take and that have been prescribed to you. Take any new Medicines after you have completely understood and accpet all the possible adverse reactions/side effects.   Please note  You were cared for by a hospitalist during your hospital stay. If you have any questions about your discharge medications or the care you received while you were in the hospital after you are discharged, you can call the unit and asked to speak with the hospitalist on call if the hospitalist that took care of you is not available. Once you are discharged, your primary care physician will handle any further medical issues. Please note that NO REFILLS for any discharge medications will be authorized once you are discharged, as it is imperative that you return to your primary care physician (or establish a  relationship with a primary care physician if you do not have one) for your aftercare needs so that they can reassess your need for medications and monitor your lab values.    On the day of Discharge:  VITAL SIGNS:   Blood pressure (!) 97/46, pulse (!) 101, temperature 98.3 F (36.8 C), temperature source Oral, resp. rate 20, height 4\' 10"  (1.473 m), weight 63.1 kg (139 lb 1.6 oz), SpO2 95 %.  PHYSICAL EXAMINATION:    GENERAL:  71 y.o.-year-old patient lying in the bed with no acute distress.  EYES: Pupils equal, round, reactive to light and accommodation. No scleral icterus. Extraocular muscles intact.  HEENT: Head atraumatic, normocephalic. Oropharynx and nasopharynx clear.  NECK:  Supple, no jugular venous distention. No thyroid enlargement, no tenderness.  LUNGS: Normal breath sounds bilaterally, no wheezing, rhonchi or crepitation. No use of accessory muscles of respiration. Bibasilar crackles heard CARDIOVASCULAR: S1, S2 normal. No  rubs, or gallops. 3/6 systolic  murmur is present ABDOMEN: Soft, nontender, nondistended. Bowel sounds present. No organomegaly or mass.  EXTREMITIES: No pedal edema, cyanosis, or clubbing.  NEUROLOGIC: Cranial nerves II through XII are intact. Muscle strength 5/5 in all extremities. Sensation intact. Gait not checked.  PSYCHIATRIC: The patient is alert and oriented x 3.  SKIN: No obvious rash, lesion, or ulcer.    DATA REVIEW:   CBC  Recent Labs Lab 01/17/17 0647  WBC 6.1  HGB 10.2*  HCT 31.1*  PLT 131*    Chemistries   Recent Labs Lab 01/16/17 0218 01/17/17 0647  NA 139 137  K 3.8 4.2  CL 109 107  CO2 24 21*  GLUCOSE 168* 207*  BUN 8 9  CREATININE 0.60 0.71  CALCIUM 8.5* 8.4*  MG 1.5*  --      Microbiology Results  Results for orders placed or performed during the hospital encounter of 11/26/14  Urine culture     Status: None   Collection Time: 11/26/14  5:08 PM  Result Value Ref Range Status   Specimen Description  URINE, CLEAN CATCH  Final   Special Requests NONE  Final   Culture 90,000 COLONIES/ml ESCHERICHIA COLI  Final   Report Status 11/29/2014 FINAL  Final   Organism ID, Bacteria ESCHERICHIA COLI  Final      Susceptibility   Escherichia coli - MIC*    AMPICILLIN <=2 SENSITIVE Sensitive     CEFTAZIDIME <=1 SENSITIVE Sensitive     CEFAZOLIN <=4 SENSITIVE Sensitive     CEFTRIAXONE <=1 SENSITIVE Sensitive     CIPROFLOXACIN >=4 RESISTANT Resistant     GENTAMICIN <=1 SENSITIVE Sensitive     IMIPENEM <=0.25 SENSITIVE Sensitive     TRIMETH/SULFA <=20 SENSITIVE Sensitive     CEFOXITIN <=4 SENSITIVE Sensitive     * 90,000 COLONIES/ml ESCHERICHIA COLI  Culture, blood (routine x 2)     Status: None   Collection Time: 11/26/14  5:53 PM  Result Value Ref Range Status   Specimen Description BLOOD  Final   Special Requests BLOOD  Final   Culture NO GROWTH 5 DAYS  Final   Report Status 12/01/2014 FINAL  Final  Culture, blood (routine x 2)     Status: None   Collection Time: 11/26/14  6:03 PM  Result Value Ref Range Status   Specimen Description BLOOD  Final   Special Requests BLOOD  Final   Culture NO GROWTH 5 DAYS  Final   Report Status 12/01/2014 FINAL  Final    RADIOLOGY:  No results found.   Management plans discussed with the patient, family and they are in agreement.  CODE STATUS:     Code Status Orders        Start     Ordered   01/15/17 1814  Full code  Continuous     01/15/17 1813    Code Status History    Date Active Date Inactive Code Status Order ID Comments User Context   This patient has a current code status but no historical code status.      TOTAL TIME TAKING CARE OF THIS PATIENT: 37 minutes.    Ashten Prats M.D on 01/17/2017 at 4:09 PM  Between 7am to 6pm - Pager - [phone]  After 6pm go to [email or web address] - Social research officer, government  [facility]  Office  [phone]  CC: Primary care physician; Corky Downs, MD   Note:  This dictation was prepared with Dragon dictation along with smaller phrase technology. Any transcriptional  errors that result from this process are unintentional.

## 2017-01-20 ENCOUNTER — Encounter: Payer: Self-pay | Admitting: Physician Assistant

## 2017-01-20 ENCOUNTER — Telehealth: Payer: Self-pay | Admitting: Cardiovascular Disease

## 2017-01-20 NOTE — Telephone Encounter (Signed)
Patient contacted regarding discharge from [facility] on 01/17/17.  Patient understands to follow up with provider Eula Listen, PA-C on 7/25 at 2:30pm at [facility], [place]. Patient understands discharge instructions? yes Patient understands medications and regiment? yes Patient understands to bring all medications to this visit? yes

## 2017-01-21 ENCOUNTER — Ambulatory Visit: Payer: BLUE CROSS/BLUE SHIELD | Admitting: Physician Assistant

## 2017-01-22 ENCOUNTER — Encounter: Payer: Self-pay | Admitting: Physician Assistant

## 2017-01-22 ENCOUNTER — Ambulatory Visit (INDEPENDENT_AMBULATORY_CARE_PROVIDER_SITE_OTHER): Payer: BLUE CROSS/BLUE SHIELD | Admitting: Physician Assistant

## 2017-01-22 VITALS — BP 104/54 | HR 94 | Ht <= 58 in | Wt 139.5 lb

## 2017-01-22 DIAGNOSIS — Z01818 Encounter for other preprocedural examination: Secondary | ICD-10-CM

## 2017-01-22 DIAGNOSIS — R0989 Other specified symptoms and signs involving the circulatory and respiratory systems: Secondary | ICD-10-CM | POA: Diagnosis not present

## 2017-01-22 DIAGNOSIS — E118 Type 2 diabetes mellitus with unspecified complications: Secondary | ICD-10-CM

## 2017-01-22 DIAGNOSIS — Z01810 Encounter for preprocedural cardiovascular examination: Secondary | ICD-10-CM | POA: Diagnosis not present

## 2017-01-22 DIAGNOSIS — Z8673 Personal history of transient ischemic attack (TIA), and cerebral infarction without residual deficits: Secondary | ICD-10-CM

## 2017-01-22 DIAGNOSIS — I5022 Chronic systolic (congestive) heart failure: Secondary | ICD-10-CM

## 2017-01-22 DIAGNOSIS — E039 Hypothyroidism, unspecified: Secondary | ICD-10-CM

## 2017-01-22 DIAGNOSIS — D649 Anemia, unspecified: Secondary | ICD-10-CM

## 2017-01-22 DIAGNOSIS — I350 Nonrheumatic aortic (valve) stenosis: Secondary | ICD-10-CM | POA: Diagnosis not present

## 2017-01-22 MED ORDER — METOPROLOL SUCCINATE ER 25 MG PO TB24: 12.5000 mg | ORAL_TABLET | Freq: Every day | ORAL | 3 refills | Status: DC

## 2017-01-22 NOTE — Progress Notes (Signed)
Cardiology Office Note Date:  01/22/2017  Patient ID:  Jenny, [name] 12/12/1945, MRN [id number] PCP:  Corky Downs, MD  Cardiologist:  Dr. Kirke Corin, MD    Chief Complaint: Hospital follow up  History of Present Illness: Jenny [name] is a 71 y.o. female with history of recently diagnosed chronic systolic CHF, severe aortic stenosis with probable bicuspid aortic valve, prior stroke on aspirin and Plavix, iron deficiency anemia, prior tobacco abuse quitting 18 years prior, DM, hypothyroidism, and anxiety/depression who presents for [facility] hospital follow-up from recent admission from 7/18 - 7/20 for acute systolic CHF, elevated troponin, and iron deficiency anemia.   Prior ischemic evaluation in 10/2010 via nuclear stress test that was low risk with normal EF. Patient was admitted in mid July for a 2 to 3 week history of increased fatigue with associated chest pressure and shortness of breath. Upon her arrival to [facility] she was noted to have a mildly elevated troponin with a peak of 0.07, down trending. She was also noted to have a worsening anemia with hemoglobin of 7.6 (prior hemoglobin 10.5 in 2016). Echocardiogram showed a mildly reduced LV systolic function with EF of 45-50%, probable hypokinesis of the mid apical anteroseptal, anterior, and apical myocardium, grade 2 diastolic dysfunction, possibly bicuspid aortic valve that was moderately thickened with severely calcified leaflets. There was severe aortic stenosis with a mean gradient of 47 mmHg and valve area of 0.52 cm. There was mild mitral regurgitation. The left atrium was mildly dilated. The patient was successfully diuresed with IV Lasix and she received one unit of packed red blood cells with improvement in hemoglobin to 10 at discharge. Discharge weight of 139 pounds. Outpatient R/LHC was advised given elevated troponin and abnormal echo, in the setting of anemia as above.   She comes in feeling well today. Overall, she feels much  improved today from prior to her admission. She does continue to note some exertional fatigue and SOB, though much improved. No dizziness, presyncope, or syncope. No chest pain. She did not stop taking diltiazem at time of hospital discharge and has continued with Cardizem 240 mg daily. She has not started metoprolol. No lower extremity swelling, PND, cough, or early satiety. Stable 2-pillow orthopnea. BP well controlled at home.    Past Medical History:  Diagnosis Date  . Anxiety   . Chronic systolic CHF (congestive heart failure) (HCC)    a. TTE 12/2016, EF 45-50%, probable hypokinesis of the mid apical anteroseptal, anterior, & apical myocardium, GR2DD, possibly bicuspid aortic valve that was moderately thickened w/ severely calcified leaflets, severe aortic stenosis w/ mean gradient 47 mmHg, valve area 0.52 cm, mild MR, mildly dilated LA  . Depression   . Diabetes mellitus with complication (HCC)   . Hypothyroidism   . Iron deficiency anemia    a. s/p pRBC x1 in 12/2016  . Panic attacks   . Severe aortic stenosis    a. TTE 12/2016: possibly bicuspid aortic valve that was moderately thickened with severely calcified leaflets. There was severe aortic stenosis with a mean gradient of 47 mmHg and valve area of 0.52 cm  . Stroke (HCC) 2001  . TIA (transient ischemic attack) 2006    Past Surgical History:  Procedure Laterality Date  . ABDOMINAL HYSTERECTOMY    . CATARACT EXTRACTION W/PHACO Right 04/09/2016   Procedure: CATARACT EXTRACTION PHACO AND INTRAOCULAR LENS PLACEMENT (IOC);  Surgeon: Galen Manila, MD;  Location: [facility] ORS;  Service: Ophthalmology;  Laterality: Right;  [place] 57.2 AP%  18.6 CDE 10.64 Fluid Pack lot # [id number]  . CHOLECYSTECTOMY    . FRACTURE SURGERY     LEFT ANKLE    Current Meds  Medication Sig  . acetaminophen (TYLENOL) 325 MG tablet Take 2 tablets (650 mg total) by mouth every 6 (six) hours as needed for mild pain, fever or headache.  . ALPRAZolam (XANAX) 0.5  MG tablet Take 0.5 mg by mouth 2 (two) times daily. Take 0.5 mg in the morning and 1 mg at night.  Marland Kitchen DILT-XR 240 MG 24 hr capsule Take 240 mg by mouth daily.  . diphenhydrAMINE (BENADRYL) 25 MG tablet Take 25 mg by mouth every 6 (six) hours as needed.  . ferrous sulfate 325 (65 FE) MG tablet Take 1 tablet (325 mg total) by mouth 2 (two) times daily with a meal.  . furosemide (LASIX) 40 MG tablet Take 1 tablet (40 mg total) by mouth daily.  . hydrOXYzine (ATARAX/VISTARIL) 25 MG tablet Take 25 mg by mouth 2 (two) times daily.  Marland Kitchen JANUVIA 100 MG tablet Take 100 mg by mouth daily.  Marland Kitchen LANTUS 100 UNIT/ML injection Inject 21 Units into the skin daily.  Marland Kitchen levothyroxine (SYNTHROID, LEVOTHROID) 88 MCG tablet Take 88 mcg by mouth daily before breakfast.  . losartan (COZAAR) 25 MG tablet Take 1 tablet (25 mg total) by mouth daily.  . metFORMIN (GLUCOPHAGE) 500 MG tablet Take 1,000 mg by mouth 2 (two) times daily with a meal.  . rosuvastatin (CRESTOR) 10 MG tablet Take 10 mg by mouth daily.  . sertraline (ZOLOFT) 100 MG tablet Take 100 mg by mouth 2 (two) times daily.    Allergies:   Shellfish allergy and Iodine   Social History:  The patient  reports that she has quit smoking. She has never used smokeless tobacco. She reports that she does not drink alcohol or use drugs.   Family History:  The patient's family history includes Hypertension in her mother.  ROS:   Review of Systems  Constitutional: Positive for malaise/fatigue. Negative for chills, diaphoresis, fever and weight loss.  HENT: Negative for congestion.   Eyes: Negative for discharge and redness.  Respiratory: Positive for shortness of breath. Negative for cough, hemoptysis, sputum production and wheezing.   Cardiovascular: Negative for chest pain, palpitations, orthopnea, claudication, leg swelling and PND.  Gastrointestinal: Negative for abdominal pain, blood in stool, heartburn, melena, nausea and vomiting.  Genitourinary: Negative for  hematuria.  Musculoskeletal: Negative for falls and myalgias.  Skin: Negative for rash.  Neurological: Positive for weakness. Negative for dizziness, tingling, tremors, sensory change, speech change, focal weakness and loss of consciousness.  Endo/Heme/Allergies: Does not bruise/bleed easily.  Psychiatric/Behavioral: Negative for substance abuse. The patient is not nervous/anxious.   All other systems reviewed and are negative.    PHYSICAL EXAM:  VS:  BP (!) 104/54 (BP Location: Left Arm, Patient Position: Sitting, Cuff Size: Normal)   Pulse 94   Ht 4\' 10"  (1.473 m)   Wt 139 lb 8 oz (63.3 kg)   BMI 29.16 kg/m  BMI: Body mass index is 29.16 kg/m.  Physical Exam  Constitutional: She is oriented to person, place, and time. She appears well-developed and well-nourished.  HENT:  Head: Normocephalic and atraumatic.  Eyes: Right eye exhibits no discharge. Left eye exhibits no discharge.  Neck: Normal range of motion. No JVD present.  Cardiovascular: Normal rate, regular rhythm, S1 normal and S2 normal.  Exam reveals no distant heart sounds, no friction rub, no midsystolic click and no  opening snap.   Murmur heard.  Harsh midsystolic murmur is present with a grade of 3/6  at the upper right sternal border radiating to the neck Pulses:      Carotid pulses are on the right side with bruit, and on the left side with bruit. Pulmonary/Chest: Effort normal and breath sounds normal. No respiratory distress. She has no decreased breath sounds. She has no wheezes. She has no rales. She exhibits no tenderness.  Abdominal: Soft. She exhibits no distension. There is no tenderness.  Musculoskeletal: She exhibits no edema.  Neurological: She is alert and oriented to person, place, and time.  Skin: Skin is warm and dry. No cyanosis. Nails show no clubbing.  Psychiatric: She has a normal mood and affect. Her speech is normal and behavior is normal. Judgment and thought content normal.     EKG:  Was  ordered and interpreted by me today. Shows NSR, 94 bpm, LAE, LVH, no acute st/t changes  Recent Labs: 01/15/2017: B Natriuretic Peptide 1,605.0 01/16/2017: Magnesium 1.5; TSH 0.812 01/17/2017: BUN 9; Creatinine, Ser 0.71; Hemoglobin 10.2; Platelets 131; Potassium 4.2; Sodium 137  01/15/2017: Cholesterol 92; HDL 36; LDL Cholesterol 37; Total CHOL/HDL Ratio 2.6; Triglycerides 97; VLDL 19   Estimated Creatinine Clearance: 53 mL/min (by C-G formula based on SCr of 0.71 mg/dL).   Wt Readings from Last 3 Encounters:  01/22/17 139 lb 8 oz (63.3 kg)  01/17/17 139 lb 1.6 oz (63.1 kg)  04/08/16 140 lb (63.5 kg)     Other studies reviewed: Additional studies/records reviewed today include: summarized above  ASSESSMENT AND PLAN:  1. Chronic systolic CHF: She does not appear volume overloaded at this time. Schedule R/LHC given her recently diagnosed systolic CHF and severe aortic stenosis. R/LHC on 8/6 after discussion with Dr. Kirke Corin, MD. It does not appear she stopped diltiazem at hospital discharge. I again stopped diltiazem and placed her on low-dose Toprol XL 12.5 mg daily given her cardiomyopathy. Her current SBP of 104 mmHg precludes higher dose as well as addition of spironolactone at this time. She does report an iodine allergy and will need to be pre-treated with steroids, Benadryl, and Zantac (these orders have been placed at the time of her cardiac cath orders). CHF education.   2. Severe aortic stenosis: No symptoms of dizziness or presyncope. She will need [facility] for further evaluation of her AS as well as for evaluation of her recently diagnosed systolic CHF/elevated troponin. TTE while admitted did show a mildly reduced LV systolic function, possibly in the setting of her severe aortic stenosis vs ischemia. Will need R/LHC as above followed by referral to cardiothoracic surgery to discuss possible AVR +/- revascularization pending LHC. She is volume dependent at this time.   3. Iron deficiency  anemia: Felt to be in the setting of excessive aspirin use along with being on Plavix. Continues to hold DAPT. Status post 1 unit pRBC while admitted as above. On iron supplementation. Not felt to be a good endoscopic candidate until her cardiac status is further evaluated. Check CBC to evaluate for stable hgb.   4. Prior stroke/TIA: Previously on DAPT with ASA 81 mg and Plavix 75 mg daily. DAPT held during the above hospital admission given anemia requiring pRBC transfusion. Defer restarting antiplatelet therapy to PCP/neurology.   5. Hypomagnesemia: Check magnesium to evaluate for stability.   6. Hypothyroidism: On replacement therapy per PCP. Recent TSH normal.   7. DM2 with complication: A1c while admitted 8.9. Hold metformin starting 8/4 and  continue to hold until 48 hours s/p cardiac catheterization. Per PCP.   8. Bruit: Possible referred cardiac murmur from her severe aortic stenosis. Will check carotid ultrasound as she will also need this as part of her CVTS work up.   Disposition: F/u with me in ~ 4 weeks.   Current medicines are reviewed at length with the patient today.  The patient did not have any concerns regarding medicines.  Elinor Dodge PA-C 01/22/2017 2:50 PM     [facility] [place] [facility] [address] [place], [place] [address] [phone]

## 2017-01-22 NOTE — Patient Instructions (Addendum)
Medication Instructions:  Your physician has recommended you make the following change in your medication:  1- STOP taking Diltiazem. 2- START taking Toprol XL 0.5 tablet (12.5 mg) by mouth once a day.   Labwork: Your physician recommends that you return for lab work in: TODAY (CBC, BMP, PT/INR). - Please go to the [facility]. You will check in at the front desk to the right as you walk into the atrium. Valet Parking is offered if needed.    Testing/Procedures: 1. Your physician has requested that you have a carotid duplex. This test is an ultrasound of the carotid arteries in your neck. It looks at blood flow through these arteries that supply the brain with blood. Allow one hour for this exam. There are no restrictions or special instructions.   2.  [facility] Cardiac Cath Instructions   You are scheduled for a Cardiac Cath on:___08/06/18________  Please arrive at _07:30__am on the day of your procedure  Please expect a call from our [facility] to pre-register you  Do not eat/drink anything after midnight  Someone will need to drive you home  It is recommended someone be with you for the first 24 hours after your procedure  Wear clothes that are easy to get on/off and wear slip on shoes if possible   Medications bring a current list of all medications with you  _X_ You may take your remaining medications not listed below the morning of your procedure with enough water to swallow safely  _X_ Do not take these medications before your procedure:___Metformin (stop on 02/01/17), Furosemide, Losartan, Januvia.   Day of your procedure: Arrive at the [facility] entrance.  Free valet service is available.  After entering the [facility] please check-in at the registration desk (1st desk on your right) to receive your armband. After receiving your armband someone will escort you to the cardiac cath/special procedures waiting area.  The usual length of stay  after your procedure is about 2 to 3 hours.  This can vary.  If you have any questions, please call our office at [phone], or you may call the cardiac cath lab at [facility] directly at [phone]   If you need a refill on your cardiac medications before your next appointment, please call your pharmacy.     Coronary Angiogram With Stent Coronary angiogram with stent placement is a procedure to widen or open a narrow blood vessel of the heart (coronary artery). Arteries may become blocked by cholesterol buildup (plaques) in the lining or wall. When a coronary artery becomes partially blocked, blood flow to that area decreases. This may lead to chest pain or a heart attack (myocardial infarction). A stent is a small piece of metal that looks like mesh or a spring. Stent placement may be done as treatment for a heart attack or right after a coronary angiogram in which a blocked artery is found. Let your health care provider know about:  Any allergies you have.  All medicines you are taking, including vitamins, herbs, eye drops, creams, and over-the-counter medicines.  Any problems you or family members have had with anesthetic medicines.  Any blood disorders you have.  Any surgeries you have had.  Any medical conditions you have.  Whether you are pregnant or may be pregnant. What are the risks? Generally, this is a safe procedure. However, problems may occur, including:  Damage to the heart or its blood vessels.  A return of blockage.  Bleeding, infection, or bruising  at the insertion site.  A collection of blood under the skin (hematoma) at the insertion site.  A blood clot in another part of the body.  Kidney injury.  Allergic reaction to the dye or contrast that is used.  Bleeding into the abdomen (retroperitoneal bleeding).  What happens before the procedure? Staying hydrated Follow instructions from your health care provider about hydration, which may  include:  Up to 2 hours before the procedure - you may continue to drink clear liquids, such as water, clear fruit juice, black coffee, and plain tea.  Eating and drinking restrictions Follow instructions from your health care provider about eating and drinking, which may include:  8 hours before the procedure - stop eating heavy meals or foods such as meat, fried foods, or fatty foods.  6 hours before the procedure - stop eating light meals or foods, such as toast or cereal.  2 hours before the procedure - stop drinking clear liquids.  Ask your health care provider about:  Changing or stopping your regular medicines. This is especially important if you are taking diabetes medicines or blood thinners.  Taking medicines such as ibuprofen. These medicines can thin your blood. Do not take these medicines before your procedure if your health care provider instructs you not to. Generally, aspirin is recommended before a procedure of passing a small, thin tube (catheter) through a blood vessel and into the heart (cardiac catheterization).  What happens during the procedure?  An IV tube will be inserted into one of your veins.  You will be given one or more of the following: ? A medicine to help you relax (sedative). ? A medicine to numb the area where the catheter will be inserted into an artery (local anesthetic).  To reduce your risk of infection: ? Your health care team will wash or sanitize their hands. ? Your skin will be washed with soap. ? Hair may be removed from the area where the catheter will be inserted.  Using a guide wire, the catheter will be inserted into an artery. The location may be in your groin, in your wrist, or in the fold of your arm (near your elbow).  A type of X-ray (fluoroscopy) will be used to help guide the catheter to the opening of the arteries in the heart.  A dye will be injected into the catheter, and X-rays will be taken. The dye will help to show  where any narrowing or blockages are located in the arteries.  A tiny wire will be guided to the blocked spot, and a balloon will be inflated to make the artery wider.  The stent will be expanded and will crush the plaques into the wall of the vessel. The stent will hold the area open and improve the blood flow. Most stents have a drug coating to reduce the risk of the stent narrowing over time.  The artery may be made wider using a drill, laser, or other tools to remove plaques.  When the blood flow is better, the catheter will be removed. The lining of the artery will grow over the stent, which stays where it was placed. This procedure may vary among health care providers and hospitals. What happens after the procedure?  If the procedure is done through the leg, you will be kept in bed lying flat for about 6 hours. You will be instructed to not bend and not cross your legs.  The insertion site will be checked frequently.  The pulse in your  foot or wrist will be checked frequently.  You may have additional blood tests, X-rays, and a test that records the electrical activity of your heart (electrocardiogram, or ECG). This information is not intended to replace advice given to you by your health care provider. Make sure you discuss any questions you have with your health care provider. Document Released: 12/22/2002 Document Revised: 02/15/2016 Document Reviewed: 01/21/2016 Elsevier Interactive Patient Education  2017 [employer].

## 2017-01-23 ENCOUNTER — Encounter
Admission: RE | Disposition: A | Discharge status: Home / Self Care | Payer: Self-pay | Source: Ambulatory Visit | Attending: Cardiovascular Disease

## 2017-01-23 ENCOUNTER — Ambulatory Visit: Payer: BLUE CROSS/BLUE SHIELD

## 2017-01-23 DIAGNOSIS — R0989 Other specified symptoms and signs involving the circulatory and respiratory systems: Secondary | ICD-10-CM

## 2017-01-23 SURGERY — RIGHT/LEFT HEART CATH AND CORONARY ANGIOGRAPHY
Anesthesia: Moderate Sedation

## 2017-01-24 LAB — VAS US CAROTID
LCCADDIAS: -15 cm/s
LCCADSYS: -96 cm/s
LCCAPSYS: -90 cm/s
LEFT ECA DIAS: 0 cm/s
LEFT VERTEBRAL DIAS: 15 cm/s
LICADDIAS: -12 cm/s
Left CCA prox dias: -8 cm/s
Left ICA dist sys: -45 cm/s
Left ICA prox dias: 14 cm/s
Left ICA prox sys: 77 cm/s
RCCADSYS: -80 cm/s
RCCAPSYS: -80 cm/s
RIGHT ECA DIAS: 15 cm/s
RIGHT VERTEBRAL DIAS: -10 cm/s
Right CCA prox dias: -17 cm/s

## 2017-01-28 ENCOUNTER — Telehealth: Payer: Self-pay | Admitting: *Deleted

## 2017-01-28 NOTE — Telephone Encounter (Signed)
Patient has heart cath scheduled for 02/03/17. She did not go to lab work after office visit last week. No answer. Left message to call back.

## 2017-01-28 NOTE — Telephone Encounter (Signed)
Patient called back. She has appt in the morning with Clarisa Kindred, NP so she will go to get lab work at that time.

## 2017-01-29 ENCOUNTER — Ambulatory Visit: Payer: BLUE CROSS/BLUE SHIELD | Attending: Family | Admitting: Family

## 2017-01-29 ENCOUNTER — Other Ambulatory Visit
Admission: RE | Admit: 2017-01-29 | Discharge: 2017-01-29 | Disposition: A | Discharge status: Home / Self Care | Payer: BLUE CROSS/BLUE SHIELD | Source: Ambulatory Visit | Attending: Family | Admitting: Family

## 2017-01-29 ENCOUNTER — Encounter: Payer: Self-pay | Admitting: Family

## 2017-01-29 VITALS — BP 103/49 | HR 100 | Resp 18 | Ht <= 58 in | Wt 134.5 lb

## 2017-01-29 DIAGNOSIS — Z8673 Personal history of transient ischemic attack (TIA), and cerebral infarction without residual deficits: Secondary | ICD-10-CM | POA: Diagnosis not present

## 2017-01-29 DIAGNOSIS — F329 Major depressive disorder, single episode, unspecified: Secondary | ICD-10-CM

## 2017-01-29 DIAGNOSIS — E039 Hypothyroidism, unspecified: Secondary | ICD-10-CM | POA: Diagnosis not present

## 2017-01-29 DIAGNOSIS — F419 Anxiety disorder, unspecified: Secondary | ICD-10-CM | POA: Insufficient documentation

## 2017-01-29 DIAGNOSIS — D509 Iron deficiency anemia, unspecified: Secondary | ICD-10-CM | POA: Diagnosis not present

## 2017-01-29 DIAGNOSIS — Z794 Long term (current) use of insulin: Secondary | ICD-10-CM | POA: Diagnosis present

## 2017-01-29 DIAGNOSIS — Z87891 Personal history of nicotine dependence: Secondary | ICD-10-CM | POA: Insufficient documentation

## 2017-01-29 DIAGNOSIS — I350 Nonrheumatic aortic (valve) stenosis: Secondary | ICD-10-CM | POA: Insufficient documentation

## 2017-01-29 DIAGNOSIS — F39 Unspecified mood [affective] disorder: Secondary | ICD-10-CM | POA: Insufficient documentation

## 2017-01-29 DIAGNOSIS — I5022 Chronic systolic (congestive) heart failure: Secondary | ICD-10-CM | POA: Insufficient documentation

## 2017-01-29 DIAGNOSIS — Z01818 Encounter for other preprocedural examination: Secondary | ICD-10-CM | POA: Diagnosis present

## 2017-01-29 DIAGNOSIS — E119 Type 2 diabetes mellitus without complications: Secondary | ICD-10-CM | POA: Diagnosis present

## 2017-01-29 DIAGNOSIS — Z01810 Encounter for preprocedural cardiovascular examination: Secondary | ICD-10-CM | POA: Diagnosis present

## 2017-01-29 DIAGNOSIS — E118 Type 2 diabetes mellitus with unspecified complications: Secondary | ICD-10-CM | POA: Insufficient documentation

## 2017-01-29 LAB — BASIC METABOLIC PANEL
ANION GAP: 11 (ref 5–15)
BUN: 12 mg/dL (ref 6–20)
CALCIUM: 10.2 mg/dL (ref 8.9–10.3)
CO2: 27 mmol/L (ref 22–32)
Chloride: 99 mmol/L — ABNORMAL LOW (ref 101–111)
Creatinine, Ser: 0.84 mg/dL (ref 0.44–1.00)
GLUCOSE: 172 mg/dL — AB (ref 65–99)
POTASSIUM: 4 mmol/L (ref 3.5–5.1)
SODIUM: 137 mmol/L (ref 135–145)

## 2017-01-29 LAB — CBC WITH DIFFERENTIAL/PLATELET
BASOS ABS: 0.1 10*3/uL (ref 0–0.1)
BASOS PCT: 1 %
EOS PCT: 3 %
Eosinophils Absolute: 0.2 10*3/uL (ref 0–0.7)
HCT: 34.7 % — ABNORMAL LOW (ref 35.0–47.0)
Hemoglobin: 11.1 g/dL — ABNORMAL LOW (ref 12.0–16.0)
LYMPHS PCT: 29 %
Lymphs Abs: 1.7 10*3/uL (ref 1.0–3.6)
MCH: 24.5 pg — ABNORMAL LOW (ref 26.0–34.0)
MCHC: 31.9 g/dL — ABNORMAL LOW (ref 32.0–36.0)
MCV: 76.7 fL — AB (ref 80.0–100.0)
MONO ABS: 0.5 10*3/uL (ref 0.2–0.9)
Monocytes Relative: 9 %
Neutro Abs: 3.5 10*3/uL (ref 1.4–6.5)
Neutrophils Relative %: 58 %
PLATELETS: 134 10*3/uL — AB (ref 150–440)
RBC: 4.52 MIL/uL (ref 3.80–5.20)
RDW: 23.3 % — AB (ref 11.5–14.5)
WBC: 6 10*3/uL (ref 3.6–11.0)

## 2017-01-29 LAB — PROTIME-INR
INR: 1.26
PROTHROMBIN TIME: 15.9 s — AB (ref 11.4–15.2)

## 2017-01-29 LAB — GLUCOSE, CAPILLARY: GLUCOSE-CAPILLARY: 178 mg/dL — AB (ref 65–99)

## 2017-01-29 NOTE — Progress Notes (Signed)
Patient ID: Jenny [name], female    DOB: 08/26/1945, 71 y.o.   MRN: [id number]  HPI  Ms Brossman is a 71 y/o female with a history of DM, stroke, thyroid disease, depression, anxiety, severe aortic stenosis, TIA, iron deficiency anemia, previous tobacco use and chronic heart failure.  Reviewed echo from 01/15/17 which showed an EF of 45-50% along with severe AS and mild MR.   Admitted 01/15/17 due to HF exacerbation. Cardiology consult obtained. NSTEMI ruled out. Received 1 unit of PRBC's due to anemia. Discharged home after 2 days.   She presents today for her initial visit with a chief complaint of moderate fatigue with little exertion. She describes this as chronic in nature having been present for several months. She has associated shortness of breath, cough and light-headedness along with this. She denies any chest pain. Fatigue improves when she rests for awhile.  Past Medical History:  Diagnosis Date  . Anxiety   . Chronic systolic CHF (congestive heart failure) (HCC)    a. TTE 12/2016, EF 45-50%, probable hypokinesis of the mid apical anteroseptal, anterior, & apical myocardium, GR2DD, possibly bicuspid aortic valve that was moderately thickened w/ severely calcified leaflets, severe aortic stenosis w/ mean gradient 47 mmHg, valve area 0.52 cm, mild MR, mildly dilated LA  . Depression   . Diabetes mellitus with complication (HCC)   . Hypothyroidism   . Iron deficiency anemia    a. s/p pRBC x1 in 12/2016  . Panic attacks   . Severe aortic stenosis    a. TTE 12/2016: possibly bicuspid aortic valve that was moderately thickened with severely calcified leaflets. There was severe aortic stenosis with a mean gradient of 47 mmHg and valve area of 0.52 cm  . Stroke (HCC) 2001  . TIA (transient ischemic attack) 2006   Past Surgical History:  Procedure Laterality Date  . ABDOMINAL HYSTERECTOMY    . CATARACT EXTRACTION W/PHACO Right 04/09/2016   Procedure: CATARACT EXTRACTION PHACO AND  INTRAOCULAR LENS PLACEMENT (IOC);  Surgeon: Galen Manila, MD;  Location: [facility] ORS;  Service: Ophthalmology;  Laterality: Right;  [place] 57.2 AP% 18.6 CDE 10.64 Fluid Pack lot # [id number]  . CHOLECYSTECTOMY    . FRACTURE SURGERY     LEFT ANKLE   Family History  Problem Relation Age of Onset  . Hypertension Mother    Social History  Substance Use Topics  . Smoking status: Former Games developer  . Smokeless tobacco: Never Used  . Alcohol use No   Allergies  Allergen Reactions  . Shellfish Allergy Anaphylaxis  . Iodine     BETADINE OKAY   Prior to Admission medications   Medication Sig Start Date End Date Taking? Authorizing Provider  acetaminophen (TYLENOL) 325 MG tablet Take 2 tablets (650 mg total) by mouth every 6 (six) hours as needed for mild pain, fever or headache. 01/17/17  Yes Enid Baas, MD  ALPRAZolam Prudy Feeler) 0.5 MG tablet Take 0.5 mg by mouth 2 (two) times daily. Take 0.5 mg in the morning and 1 mg at night.   Yes [provider]  diphenhydrAMINE (BENADRYL) 25 MG tablet Take 25 mg by mouth every 6 (six) hours as needed.   Yes [provider]  ferrous sulfate 325 (65 FE) MG tablet Take 1 tablet (325 mg total) by mouth 2 (two) times daily with a meal. 01/17/17  Yes Enid Baas, MD  furosemide (LASIX) 40 MG tablet Take 1 tablet (40 mg total) by mouth daily. 01/17/17 01/17/18 Yes Enid Baas, MD  hydrOXYzine (ATARAX/VISTARIL) 25 MG tablet Take 25 mg by mouth 2 (two) times daily. 12/16/16  Yes [provider]  JANUVIA 100 MG tablet Take 100 mg by mouth daily. 12/20/16  Yes [provider]  LANTUS 100 UNIT/ML injection Inject 21 Units into the skin daily. 12/23/16  Yes [provider]  levothyroxine (SYNTHROID, LEVOTHROID) 88 MCG tablet Take 88 mcg by mouth daily before breakfast.   Yes [provider]  losartan (COZAAR) 25 MG tablet Take 1 tablet (25 mg total) by mouth daily. 01/17/17 01/17/18 Yes Enid Baas, MD   metFORMIN (GLUCOPHAGE) 500 MG tablet Take 1,000 mg by mouth 2 (two) times daily with a meal.   Yes [provider]  metoprolol succinate (TOPROL XL) 25 MG 24 hr tablet Take 0.5 tablets (12.5 mg total) by mouth daily. 01/22/17  Yes Dunn, Raymon Mutton, PA-C  rosuvastatin (CRESTOR) 10 MG tablet Take 10 mg by mouth daily. 11/15/16  Yes [provider]  sertraline (ZOLOFT) 100 MG tablet Take 100 mg by mouth 2 (two) times daily.   Yes [provider]    Review of Systems  Constitutional: Positive for appetite change (decreased since she got dentures one year ago) and fatigue.  HENT: Positive for congestion, rhinorrhea and tinnitus. Negative for sore throat.   Eyes: Negative.   Respiratory: Positive for cough and shortness of breath (with walking too far). Negative for chest tightness.   Cardiovascular: Negative for chest pain, palpitations and leg swelling.  Gastrointestinal: Negative for abdominal distention and abdominal pain.  Endocrine: Negative.   Genitourinary: Negative.   Musculoskeletal: Negative for back pain and neck pain.  Skin: Negative.   Allergic/Immunologic: Negative.   Neurological: Positive for light-headedness. Negative for dizziness.  Hematological: Negative for adenopathy. Does not bruise/bleed easily.  Psychiatric/Behavioral: Negative for dysphoric mood, sleep disturbance and suicidal ideas. The patient is not nervous/anxious.    Vitals:   01/29/17 0845  BP: (!) 103/49  Pulse: 100  Resp: 18  SpO2: 96%  Weight: 134 lb 8 oz (61 kg)  Height: 4\' 10"  (1.473 m)   Wt Readings from Last 3 Encounters:  01/29/17 134 lb 8 oz (61 kg)  01/22/17 139 lb 8 oz (63.3 kg)  01/17/17 139 lb 1.6 oz (63.1 kg)   Lab Results  Component Value Date   CREATININE 0.71 01/17/2017   CREATININE 0.60 01/16/2017   CREATININE 0.72 01/15/2017    Physical Exam  Constitutional: She is oriented to person, place, and time. She appears well-developed and well-nourished.  HENT:   Head: Normocephalic and atraumatic.  Neck: Normal range of motion. Neck supple. No JVD present.  Cardiovascular: Normal rate and regular rhythm.   Murmur heard.  Systolic murmur is present with a grade of 3/6  Murmur at right sternal border  Pulmonary/Chest: Effort normal. She has no wheezes. She has no rales.  Abdominal: Soft. She exhibits no distension. There is no tenderness.  Musculoskeletal: She exhibits no edema or tenderness.  Neurological: She is alert and oriented to person, place, and time.  Skin: Skin is warm and dry.  Psychiatric: She has a normal mood and affect. Her behavior is normal. Thought content normal.  Nursing note and vitals reviewed.  Assessment & Plan:  1: Chronic heart failure with reduced ejection fraction- - NYHA class III - euvolemic today - already weighing daily; instructed to call for an overnight weight gain of >2 pounds or a weekly weight gain of >5 pounds. Has lost 5 pounds since hospital discharge -  not adding salt and trying to eat low sodium foods; written dietary information given to her about following a 2000mg  sodium diet - unsure of how much fluid she drinks daily. Advised her to measure how much liquid her cup holds so that she can keep fluid intake to close to 60 ounces daily - not getting any exercise due to fatigue - BMP from 01/17/17 reviewed and shows potassium 4.2 and GFR >60 - saw cardiology (Dunn) 01/22/17 and returns to him in ~ 4 weeks - does not meet ReDS vest criteria   2: Aortic stenosis- - RHC/LHC scheduled for 02/03/17 - probable referral to cardiothoracic surgery for possible AVR +/- revascularization pending cath results  3: Diabetes- - follows with PCP (Masoud) regarding this - fasting glucose in the clinic this morning was 178  4: Depression- - patient endorses continued difficulty in dealing with her son's suicide 30 years ago - she herself denies suicidal thoughts - discussed how joint counseling might be beneficial  to them both - patient has been through counseling herself along with multiple hospitalizations related to this  Patient did not bring her medications nor a list. Each medication was verbally reviewed with the patient and she was encouraged to bring the bottles to every visit to confirm accuracy of list.  Return in 1 month or sooner for any questions/problems before then.

## 2017-01-29 NOTE — Patient Instructions (Signed)
Continue weighing daily and call for an overnight weight gain of > 2 pounds or a weekly weight gain of >5 pounds. 

## 2017-02-03 ENCOUNTER — Encounter
Admission: RE | Disposition: A | Discharge status: Home / Self Care | Payer: Self-pay | Source: Ambulatory Visit | Attending: Cardiovascular Disease

## 2017-02-03 ENCOUNTER — Encounter: Payer: Self-pay | Admitting: *Deleted

## 2017-02-03 ENCOUNTER — Ambulatory Visit
Admission: RE | Admit: 2017-02-03 | Discharge: 2017-02-03 | Disposition: A | Discharge status: Home / Self Care | Payer: BLUE CROSS/BLUE SHIELD | Source: Ambulatory Visit | Attending: Cardiovascular Disease | Admitting: Cardiovascular Disease

## 2017-02-03 ENCOUNTER — Encounter: Payer: Self-pay | Admitting: Physician Assistant

## 2017-02-03 DIAGNOSIS — F419 Anxiety disorder, unspecified: Secondary | ICD-10-CM | POA: Diagnosis not present

## 2017-02-03 DIAGNOSIS — I509 Heart failure, unspecified: Secondary | ICD-10-CM | POA: Diagnosis not present

## 2017-02-03 DIAGNOSIS — F329 Major depressive disorder, single episode, unspecified: Secondary | ICD-10-CM | POA: Diagnosis not present

## 2017-02-03 DIAGNOSIS — E039 Hypothyroidism, unspecified: Secondary | ICD-10-CM | POA: Diagnosis not present

## 2017-02-03 DIAGNOSIS — I350 Nonrheumatic aortic (valve) stenosis: Secondary | ICD-10-CM | POA: Diagnosis not present

## 2017-02-03 DIAGNOSIS — I2510 Atherosclerotic heart disease of native coronary artery without angina pectoris: Secondary | ICD-10-CM | POA: Insufficient documentation

## 2017-02-03 DIAGNOSIS — E119 Type 2 diabetes mellitus without complications: Secondary | ICD-10-CM | POA: Diagnosis not present

## 2017-02-03 DIAGNOSIS — I771 Stricture of artery: Secondary | ICD-10-CM | POA: Diagnosis not present

## 2017-02-03 DIAGNOSIS — D509 Iron deficiency anemia, unspecified: Secondary | ICD-10-CM | POA: Diagnosis not present

## 2017-02-03 DIAGNOSIS — I5022 Chronic systolic (congestive) heart failure: Secondary | ICD-10-CM | POA: Diagnosis not present

## 2017-02-03 DIAGNOSIS — Z8673 Personal history of transient ischemic attack (TIA), and cerebral infarction without residual deficits: Secondary | ICD-10-CM | POA: Diagnosis not present

## 2017-02-03 DIAGNOSIS — Z794 Long term (current) use of insulin: Secondary | ICD-10-CM | POA: Insufficient documentation

## 2017-02-03 DIAGNOSIS — Z87891 Personal history of nicotine dependence: Secondary | ICD-10-CM | POA: Insufficient documentation

## 2017-02-03 HISTORY — PX: CORONARY ANGIOGRAPHY: CATH118303

## 2017-02-03 HISTORY — PX: RIGHT AND LEFT HEART CATH: CATH118262

## 2017-02-03 LAB — GLUCOSE, CAPILLARY: GLUCOSE-CAPILLARY: 161 mg/dL — AB (ref 65–99)

## 2017-02-03 SURGERY — RIGHT AND LEFT HEART CATH
Anesthesia: Moderate Sedation

## 2017-02-03 MED ORDER — MIDAZOLAM HCL 2 MG/2ML IJ SOLN
INTRAMUSCULAR | Status: AC
  Filled 2017-02-03: qty 2

## 2017-02-03 MED ORDER — DIPHENHYDRAMINE HCL 50 MG/ML IJ SOLN
INTRAMUSCULAR | Status: AC
  Filled 2017-02-03: qty 1

## 2017-02-03 MED ORDER — VERAPAMIL HCL 2.5 MG/ML IV SOLN
INTRAVENOUS | Status: AC
  Filled 2017-02-03: qty 2

## 2017-02-03 MED ORDER — ASPIRIN 81 MG PO CHEW
81.0000 mg | CHEWABLE_TABLET | ORAL | Status: AC
  Administered 2017-02-03: 81 mg via ORAL

## 2017-02-03 MED ORDER — LIDOCAINE HCL (PF) 1 % IJ SOLN
INTRAMUSCULAR | Status: AC
  Filled 2017-02-03: qty 30

## 2017-02-03 MED ORDER — SODIUM CHLORIDE 0.9 % IV SOLN: 250.0000 mL | INTRAVENOUS | Status: DC | PRN

## 2017-02-03 MED ORDER — SODIUM CHLORIDE 0.9% FLUSH: 3.0000 mL | INTRAVENOUS | Status: DC | PRN

## 2017-02-03 MED ORDER — VERAPAMIL HCL 2.5 MG/ML IV SOLN
INTRAVENOUS | Status: DC | PRN
  Administered 2017-02-03: 2.5 mg via INTRAVENOUS

## 2017-02-03 MED ORDER — DIPHENHYDRAMINE HCL 50 MG/ML IJ SOLN
25.0000 mg | INTRAMUSCULAR | Status: AC
  Administered 2017-02-03: 08:00:00 via INTRAVENOUS

## 2017-02-03 MED ORDER — FENTANYL CITRATE (PF) 100 MCG/2ML IJ SOLN
INTRAMUSCULAR | Status: DC | PRN
  Administered 2017-02-03: 25 ug via INTRAVENOUS

## 2017-02-03 MED ORDER — HEPARIN (PORCINE) IN NACL 2-0.9 UNIT/ML-% IJ SOLN
INTRAMUSCULAR | Status: AC
  Filled 2017-02-03: qty 1000

## 2017-02-03 MED ORDER — HEPARIN SODIUM (PORCINE) 1000 UNIT/ML IJ SOLN
INTRAMUSCULAR | Status: AC
  Filled 2017-02-03: qty 1

## 2017-02-03 MED ORDER — FENTANYL CITRATE (PF) 100 MCG/2ML IJ SOLN
INTRAMUSCULAR | Status: AC
  Filled 2017-02-03: qty 2

## 2017-02-03 MED ORDER — SODIUM CHLORIDE 0.9 % IV SOLN: INTRAVENOUS | Status: DC

## 2017-02-03 MED ORDER — SODIUM CHLORIDE 0.9% FLUSH: 3.0000 mL | Freq: Two times a day (BID) | INTRAVENOUS | Status: DC

## 2017-02-03 MED ORDER — HEPARIN SODIUM (PORCINE) 1000 UNIT/ML IJ SOLN
INTRAMUSCULAR | Status: DC | PRN
  Administered 2017-02-03: 3000 [IU] via INTRAVENOUS

## 2017-02-03 MED ORDER — FAMOTIDINE IN NACL 20-0.9 MG/50ML-% IV SOLN
20.0000 mg | INTRAVENOUS | Status: AC
  Administered 2017-02-03: 20 mg via INTRAVENOUS
  Filled 2017-02-03: qty 50

## 2017-02-03 MED ORDER — ASPIRIN 81 MG PO CHEW
CHEWABLE_TABLET | ORAL | Status: AC
  Administered 2017-02-03: 81 mg via ORAL
  Filled 2017-02-03: qty 1

## 2017-02-03 MED ORDER — IOPAMIDOL (ISOVUE-300) INJECTION 61%
INTRAVENOUS | Status: DC | PRN
  Administered 2017-02-03: 115 mL via INTRA_ARTERIAL

## 2017-02-03 MED ORDER — METHYLPREDNISOLONE SODIUM SUCC 125 MG IJ SOLR
125.0000 mg | INTRAMUSCULAR | Status: AC
  Administered 2017-02-03: 08:00:00 via INTRAVENOUS

## 2017-02-03 MED ORDER — MIDAZOLAM HCL 2 MG/2ML IJ SOLN
INTRAMUSCULAR | Status: DC | PRN
  Administered 2017-02-03: 1 mg via INTRAVENOUS

## 2017-02-03 MED ORDER — METHYLPREDNISOLONE SODIUM SUCC 125 MG IJ SOLR
INTRAMUSCULAR | Status: AC
  Filled 2017-02-03: qty 2

## 2017-02-03 SURGICAL SUPPLY — 11 items
CATH BALLN WEDGE 5F 110CM (CATHETERS) ×2 IMPLANT
CATH INFINITI 5 FR JL3.5 (CATHETERS) ×2 IMPLANT
CATH OPTITORQUE JACKY 4.0 5F (CATHETERS) ×2 IMPLANT
DEVICE RAD TR BAND REGULAR (VASCULAR PRODUCTS) ×2 IMPLANT
GLIDESHEATH SLEND SS 6F .021 (SHEATH) ×2 IMPLANT
GUIDEWIRE .025 260CM (WIRE) ×3 IMPLANT
KIT MANI 3VAL PERCEP (MISCELLANEOUS) ×4 IMPLANT
KIT RIGHT HEART (MISCELLANEOUS) ×4 IMPLANT
PACK CARDIAC CATH (CUSTOM PROCEDURE TRAY) ×4 IMPLANT
SHEATH GLIDE SLENDER 4/5FR (SHEATH) ×2 IMPLANT
WIRE ROSEN-J .035X260CM (WIRE) ×3 IMPLANT

## 2017-02-03 NOTE — H&P (View-Only) (Signed)
 Cardiology Office Note Date:  01/22/2017  Patient ID:  Jenny [name], DOB 12/17/1945, MRN [id number] PCP:  Masoud, Javed, MD  Cardiologist:  Dr. Arida, MD    Chief Complaint: Hospital follow up  History of Present Illness: Jenny [name] is a 70 y.o. female with history of recently diagnosed chronic systolic CHF, severe aortic stenosis with probable bicuspid aortic valve, prior stroke on aspirin and Plavix, iron deficiency anemia, prior tobacco abuse quitting 18 years prior, DM, hypothyroidism, and anxiety/depression who presents for [facility] hospital follow-up from recent admission from 7/18 - 7/20 for acute systolic CHF, elevated troponin, and iron deficiency anemia.   Prior ischemic evaluation in 10/2010 via nuclear stress test that was low risk with normal EF. Patient was admitted in mid July for a 2 to 3 week history of increased fatigue with associated chest pressure and shortness of breath. Upon her arrival to [facility] she was noted to have a mildly elevated troponin with a peak of 0.07, down trending. She was also noted to have a worsening anemia with hemoglobin of 7.6 (prior hemoglobin 10.5 in 2016). Echocardiogram showed a mildly reduced LV systolic function with EF of 45-50%, probable hypokinesis of the mid apical anteroseptal, anterior, and apical myocardium, grade 2 diastolic dysfunction, possibly bicuspid aortic valve that was moderately thickened with severely calcified leaflets. There was severe aortic stenosis with a mean gradient of 47 mmHg and valve area of 0.52 cm. There was mild mitral regurgitation. The left atrium was mildly dilated. The patient was successfully diuresed with IV Lasix and she received one unit of packed red blood cells with improvement in hemoglobin to 10 at discharge. Discharge weight of 139 pounds. Outpatient R/LHC was advised given elevated troponin and abnormal echo, in the setting of anemia as above.   She comes in feeling well today. Overall, she feels much  improved today from prior to her admission. She does continue to note some exertional fatigue and SOB, though much improved. No dizziness, presyncope, or syncope. No chest pain. She did not stop taking diltiazem at time of hospital discharge and has continued with Cardizem 240 mg daily. She has not started metoprolol. No lower extremity swelling, PND, cough, or early satiety. Stable 2-pillow orthopnea. BP well controlled at home.    Past Medical History:  Diagnosis Date  . Anxiety   . Chronic systolic CHF (congestive heart failure) (HCC)    a. TTE 12/2016, EF 45-50%, probable hypokinesis of the mid apical anteroseptal, anterior, & apical myocardium, GR2DD, possibly bicuspid aortic valve that was moderately thickened w/ severely calcified leaflets, severe aortic stenosis w/ mean gradient 47 mmHg, valve area 0.52 cm, mild MR, mildly dilated LA  . Depression   . Diabetes mellitus with complication (HCC)   . Hypothyroidism   . Iron deficiency anemia    a. s/p pRBC x1 in 12/2016  . Panic attacks   . Severe aortic stenosis    a. TTE 12/2016: possibly bicuspid aortic valve that was moderately thickened with severely calcified leaflets. There was severe aortic stenosis with a mean gradient of 47 mmHg and valve area of 0.52 cm  . Stroke (HCC) 2001  . TIA (transient ischemic attack) 2006    Past Surgical History:  Procedure Laterality Date  . ABDOMINAL HYSTERECTOMY    . CATARACT EXTRACTION W/PHACO Right 04/09/2016   Procedure: CATARACT EXTRACTION PHACO AND INTRAOCULAR LENS PLACEMENT (IOC);  Surgeon: William Porfilio, MD;  Location: [facility] ORS;  Service: Ophthalmology;  Laterality: Right;  [place] 57.2 AP%   18.6 CDE 10.64 Fluid Pack lot # [id number]  . CHOLECYSTECTOMY    . FRACTURE SURGERY     LEFT ANKLE    Current Meds  Medication Sig  . acetaminophen (TYLENOL) 325 MG tablet Take 2 tablets (650 mg total) by mouth every 6 (six) hours as needed for mild pain, fever or headache.  . ALPRAZolam (XANAX) 0.5  MG tablet Take 0.5 mg by mouth 2 (two) times daily. Take 0.5 mg in the morning and 1 mg at night.  . DILT-XR 240 MG 24 hr capsule Take 240 mg by mouth daily.  . diphenhydrAMINE (BENADRYL) 25 MG tablet Take 25 mg by mouth every 6 (six) hours as needed.  . ferrous sulfate 325 (65 FE) MG tablet Take 1 tablet (325 mg total) by mouth 2 (two) times daily with a meal.  . furosemide (LASIX) 40 MG tablet Take 1 tablet (40 mg total) by mouth daily.  . hydrOXYzine (ATARAX/VISTARIL) 25 MG tablet Take 25 mg by mouth 2 (two) times daily.  . JANUVIA 100 MG tablet Take 100 mg by mouth daily.  . LANTUS 100 UNIT/ML injection Inject 21 Units into the skin daily.  . levothyroxine (SYNTHROID, LEVOTHROID) 88 MCG tablet Take 88 mcg by mouth daily before breakfast.  . losartan (COZAAR) 25 MG tablet Take 1 tablet (25 mg total) by mouth daily.  . metFORMIN (GLUCOPHAGE) 500 MG tablet Take 1,000 mg by mouth 2 (two) times daily with a meal.  . rosuvastatin (CRESTOR) 10 MG tablet Take 10 mg by mouth daily.  . sertraline (ZOLOFT) 100 MG tablet Take 100 mg by mouth 2 (two) times daily.    Allergies:   Shellfish allergy and Iodine   Social History:  The patient  reports that she has quit smoking. She has never used smokeless tobacco. She reports that she does not drink alcohol or use drugs.   Family History:  The patient's family history includes Hypertension in her mother.  ROS:   Review of Systems  Constitutional: Positive for malaise/fatigue. Negative for chills, diaphoresis, fever and weight loss.  HENT: Negative for congestion.   Eyes: Negative for discharge and redness.  Respiratory: Positive for shortness of breath. Negative for cough, hemoptysis, sputum production and wheezing.   Cardiovascular: Negative for chest pain, palpitations, orthopnea, claudication, leg swelling and PND.  Gastrointestinal: Negative for abdominal pain, blood in stool, heartburn, melena, nausea and vomiting.  Genitourinary: Negative for  hematuria.  Musculoskeletal: Negative for falls and myalgias.  Skin: Negative for rash.  Neurological: Positive for weakness. Negative for dizziness, tingling, tremors, sensory change, speech change, focal weakness and loss of consciousness.  Endo/Heme/Allergies: Does not bruise/bleed easily.  Psychiatric/Behavioral: Negative for substance abuse. The patient is not nervous/anxious.   All other systems reviewed and are negative.    PHYSICAL EXAM:  VS:  BP (!) 104/54 (BP Location: Left Arm, Patient Position: Sitting, Cuff Size: Normal)   Pulse 94   Ht 4' 10" (1.473 m)   Wt 139 lb 8 oz (63.3 kg)   BMI 29.16 kg/m  BMI: Body mass index is 29.16 kg/m.  Physical Exam  Constitutional: She is oriented to person, place, and time. She appears well-developed and well-nourished.  HENT:  Head: Normocephalic and atraumatic.  Eyes: Right eye exhibits no discharge. Left eye exhibits no discharge.  Neck: Normal range of motion. No JVD present.  Cardiovascular: Normal rate, regular rhythm, S1 normal and S2 normal.  Exam reveals no distant heart sounds, no friction rub, no midsystolic click and no   opening snap.   Murmur heard.  Harsh midsystolic murmur is present with a grade of 3/6  at the upper right sternal border radiating to the neck Pulses:      Carotid pulses are on the right side with bruit, and on the left side with bruit. Pulmonary/Chest: Effort normal and breath sounds normal. No respiratory distress. She has no decreased breath sounds. She has no wheezes. She has no rales. She exhibits no tenderness.  Abdominal: Soft. She exhibits no distension. There is no tenderness.  Musculoskeletal: She exhibits no edema.  Neurological: She is alert and oriented to person, place, and time.  Skin: Skin is warm and dry. No cyanosis. Nails show no clubbing.  Psychiatric: She has a normal mood and affect. Her speech is normal and behavior is normal. Judgment and thought content normal.     EKG:  Was  ordered and interpreted by me today. Shows NSR, 94 bpm, LAE, LVH, no acute st/t changes  Recent Labs: 01/15/2017: B Natriuretic Peptide 1,605.0 01/16/2017: Magnesium 1.5; TSH 0.812 01/17/2017: BUN 9; Creatinine, Ser 0.71; Hemoglobin 10.2; Platelets 131; Potassium 4.2; Sodium 137  01/15/2017: Cholesterol 92; HDL 36; LDL Cholesterol 37; Total CHOL/HDL Ratio 2.6; Triglycerides 97; VLDL 19   Estimated Creatinine Clearance: 53 mL/min (by C-G formula based on SCr of 0.71 mg/dL).   Wt Readings from Last 3 Encounters:  01/22/17 139 lb 8 oz (63.3 kg)  01/17/17 139 lb 1.6 oz (63.1 kg)  04/08/16 140 lb (63.5 kg)     Other studies reviewed: Additional studies/records reviewed today include: summarized above  ASSESSMENT AND PLAN:  1. Chronic systolic CHF: She does not appear volume overloaded at this time. Schedule R/LHC given her recently diagnosed systolic CHF and severe aortic stenosis. R/LHC on 8/6 after discussion with Dr. Arida, MD. It does not appear she stopped diltiazem at hospital discharge. I again stopped diltiazem and placed her on low-dose Toprol XL 12.5 mg daily given her cardiomyopathy. Her current SBP of 104 mmHg precludes higher dose as well as addition of spironolactone at this time. She does report an iodine allergy and will need to be pre-treated with steroids, Benadryl, and Zantac (these orders have been placed at the time of her cardiac cath orders). CHF education.   2. Severe aortic stenosis: No symptoms of dizziness or presyncope. She will need R/LHC for further evaluation of her AS as well as for evaluation of her recently diagnosed systolic CHF/elevated troponin. TTE while admitted did show a mildly reduced LV systolic function, possibly in the setting of her severe aortic stenosis vs ischemia. Will need R/LHC as above followed by referral to cardiothoracic surgery to discuss possible AVR +/- revascularization pending LHC. She is volume dependent at this time.   3. Iron deficiency  anemia: Felt to be in the setting of excessive aspirin use along with being on Plavix. Continues to hold DAPT. Status post 1 unit pRBC while admitted as above. On iron supplementation. Not felt to be a good endoscopic candidate until her cardiac status is further evaluated. Check CBC to evaluate for stable hgb.   4. Prior stroke/TIA: Previously on DAPT with ASA 81 mg and Plavix 75 mg daily. DAPT held during the above hospital admission given anemia requiring pRBC transfusion. Defer restarting antiplatelet therapy to PCP/neurology.   5. Hypomagnesemia: Check magnesium to evaluate for stability.   6. Hypothyroidism: On replacement therapy per PCP. Recent TSH normal.   7. DM2 with complication: A1c while admitted 8.9. Hold metformin starting 8/4 and   continue to hold until 48 hours s/p cardiac catheterization. Per PCP.   8. Bruit: Possible referred cardiac murmur from her severe aortic stenosis. Will check carotid ultrasound as she will also need this as part of her CVTS work up.   Disposition: F/u with me in ~ 4 weeks.   Current medicines are reviewed at length with the patient today.  The patient did not have any concerns regarding medicines.  Signed, Birdella Sippel PA-C 01/22/2017 2:50 PM     [facility] [address] [place], [place] [address] [phone] 

## 2017-02-03 NOTE — Progress Notes (Signed)
Please refer to CVTS for evaluation and discussion of repair options of severe aortic stenosis.

## 2017-02-03 NOTE — Interval H&P Note (Signed)
History and Physical Interval Note:  02/03/2017 10:58 AM  Jenny [name]  has presented today for surgery, with the diagnosis of Bilateral Heart Cath  The various methods of treatment have been discussed with the patient and family. After consideration of risks, benefits and other options for treatment, the patient has consented to  Procedure(s): Right and Left Heart Cath (Bilateral) as a surgical intervention .  The patient's history has been reviewed, patient examined, no change in status, stable for surgery.  I have reviewed the patient's chart and labs.  Questions were answered to the patient's satisfaction.     Lorine Bears

## 2017-02-04 ENCOUNTER — Telehealth: Payer: Self-pay | Admitting: Physician Assistant

## 2017-02-04 ENCOUNTER — Other Ambulatory Visit: Payer: Self-pay

## 2017-02-04 DIAGNOSIS — I350 Nonrheumatic aortic (valve) stenosis: Secondary | ICD-10-CM

## 2017-02-04 NOTE — Telephone Encounter (Signed)
Referral to TCTS placed.  Notified pt who is aware and agreeable. She would like to have this done ASAP as insurance will change effective September 1.  Notified Jill at [facility] [employer] of urgency. Confirmed they have referral.

## 2017-02-05 ENCOUNTER — Encounter: Payer: BLUE CROSS/BLUE SHIELD | Admitting: Cardiothoracic Surgery

## 2017-02-05 ENCOUNTER — Telehealth: Payer: Self-pay | Admitting: Cardiovascular Disease

## 2017-02-05 NOTE — Telephone Encounter (Signed)
Returned call to patient. Patient had right and left heart cath on 02/03/17. Reports her right arm from the elbow down felt numb yesterday. Today the numb is only in her fingers but seems to be getting better. The site is a little red, non-tender, normal skin temperature per patient. Her hand is cool but she says she's always cold.  Instructed patient on how to check capillary refill and she says her nail bed quickly returns to normal color. She has appointment today with Dr Donata Clay.  Discussed with Dr End who advised to have patient to continue to monitor and if no improvement by tomorrow to come in for an appointment to have it checked.  Patient verbalized understanding to continue to monitor for redness, swelling, abnormal skin temperature and to call us back if symptoms do not improve. She will also have Dr Donata Clay take a look at it today at her appointment.

## 2017-02-05 NOTE — Telephone Encounter (Signed)
Pt states she had a cath last week and her right arm from the elbow down is numb. She states this started last night. Please call.

## 2017-02-10 ENCOUNTER — Other Ambulatory Visit: Payer: Self-pay | Admitting: *Deleted

## 2017-02-10 ENCOUNTER — Institutional Professional Consult (permissible substitution) (INDEPENDENT_AMBULATORY_CARE_PROVIDER_SITE_OTHER): Payer: BLUE CROSS/BLUE SHIELD | Admitting: Cardiothoracic Surgery

## 2017-02-10 VITALS — BP 105/64 | HR 82 | Resp 20 | Ht <= 58 in | Wt 133.0 lb

## 2017-02-10 DIAGNOSIS — I350 Nonrheumatic aortic (valve) stenosis: Secondary | ICD-10-CM | POA: Diagnosis not present

## 2017-02-10 DIAGNOSIS — Z01818 Encounter for other preprocedural examination: Secondary | ICD-10-CM

## 2017-02-10 NOTE — Progress Notes (Deleted)
  The note originally documented on this encounter has been moved the the encounter in which it belongs.  

## 2017-02-10 NOTE — H&P (Signed)
PCP is Corky Downs, MD Referring Provider is Iran Ouch, MD  Chief Complaint  Patient presents with  . Aortic Stenosis    Surgical eval, Cadiac Cath 02/03/17, ECHO 01/15/17,  Patient examined, coronary angiogram, right heart cath data, echocardiogram images and carotid Doppler ultrasound report all personally reviewed and counseled with patient  HPI: 71 year old female with recent diagnosis of severe aortic stenosis was admitted to [facility] last month with diastolic heart failure, shortness of breath, edema and anemia. She was treated with Lasix and anemia with improved symptoms. She underwent echocardiogram which showed severe aortic stenosis with a mean gradient of 35-40 mmHg and a peak gradient of 64 mmHg. Related aortic valve area 0.75. She has history of a cardiac murmur back to childhood. She is felt to have probable bicuspid aortic stenosis. Coronary arteries have no significant disease. Right heart cath shows elevated wedge of 20 with ejection fraction of 45% with some anterior apical hypokinesia, cardiac index 2.0. The patient is in a sinus rhythm. No other cardiac valves have significant abnormality.  The patient's previous medical history includes diabetes on oral medication, history of stroke over 20 years ago, history of TIA 5 years ago but now with a normal carotid Doppler. The patient takes excellent care of her teeth and has a dental cleaning done every 6 months. She has full upper bridge and partial denture on the mandible. She will visit her dentist prior to surgery to get a dental release for cardiac valve replacement.  Past Medical History:  Diagnosis Date  . Anxiety   . Chronic systolic CHF (congestive heart failure) (HCC)    a. TTE 12/2016, EF 45-50%, probable hypokinesis of the mid apical anteroseptal, anterior, & apical myocardium, GR2DD, possibly bicuspid aortic valve that was moderately thickened w/ severely calcified leaflets, severe aortic  stenosis w/ mean gradient 47 mmHg, valve area 0.52 cm, mild MR, mildly dilated LA  . Depression   . Diabetes mellitus with complication (HCC)   . Hypothyroidism   . Iron deficiency anemia    a. s/p pRBC x1 in 12/2016  . Panic attacks   . Severe aortic stenosis    a. TTE 12/2016: possibly bicuspid aortic valve that was moderately thickened with severely calcified leaflets. There was severe aortic stenosis with a mean gradient of 47 mmHg and valve area of 0.52 cm  . Stroke (HCC) 2001  . TIA (transient ischemic attack) 2006    Past Surgical History:  Procedure Laterality Date  . ABDOMINAL HYSTERECTOMY    . CATARACT EXTRACTION W/PHACO Right 04/09/2016   Procedure: CATARACT EXTRACTION PHACO AND INTRAOCULAR LENS PLACEMENT (IOC);  Surgeon: Galen Manila, MD;  Location: [facility] ORS;  Service: Ophthalmology;  Laterality: Right;  [place] 57.2 AP% 18.6 CDE 10.64 Fluid Pack lot # [id number]  . CHOLECYSTECTOMY    . CORONARY ANGIOGRAPHY N/A 02/03/2017   Procedure: CORONARY ANGIOGRAPHY;  Surgeon: Iran Ouch, MD;  Location: [facility] INVASIVE CV LAB;  Service: Cardiovascular;  Laterality: N/A;  . FRACTURE SURGERY     LEFT ANKLE  . RIGHT AND LEFT HEART CATH Bilateral 02/03/2017   Procedure: Right and Left Heart Cath;  Surgeon: Iran Ouch, MD;  Location: [facility] INVASIVE CV LAB;  Service: Cardiovascular;  Laterality: Bilateral;    Family History  Problem Relation Age of Onset  . Hypertension Mother     Social History Social History  Substance Use Topics  . Smoking status: Former Smoker    Quit date: 02/04/2000  . Smokeless tobacco: Never  Used  . Alcohol use No    Current Outpatient Prescriptions  Medication Sig Dispense Refill  . acetaminophen (TYLENOL) 325 MG tablet Take 2 tablets (650 mg total) by mouth every 6 (six) hours as needed for mild pain, fever or headache. (Patient taking differently: Take 650 mg by mouth 3 (three) times daily as needed for mild pain, fever or headache. ) 30 tablet 0   . ALPRAZolam (XANAX) 0.5 MG tablet Take 0.5-1 mg by mouth 2 (two) times daily. Take 0.5 mg in the morning and 1 mg at night.    . Aspirin Effervescent (ALKA-SELTZER EXTRA STRENGTH PO) Take 1 tablet by mouth daily.    . diphenhydrAMINE (BENADRYL) 25 MG tablet Take 25-50 mg by mouth 2 (two) times daily as needed for itching.     . ferrous sulfate 325 (65 FE) MG tablet Take 1 tablet (325 mg total) by mouth 2 (two) times daily with a meal. 60 tablet 3  . furosemide (LASIX) 40 MG tablet Take 1 tablet (40 mg total) by mouth daily. 30 tablet 2  . hydrocortisone cream 1 % Apply 1 application topically 3 (three) times daily as needed for itching.    . hydrOXYzine (ATARAX/VISTARIL) 25 MG tablet Take 50 mg by mouth at bedtime.     Marland Kitchen JANUVIA 100 MG tablet Take 100 mg by mouth daily.    Marland Kitchen LANTUS 100 UNIT/ML injection Inject 21 Units into the skin daily.    Marland Kitchen levothyroxine (SYNTHROID, LEVOTHROID) 88 MCG tablet Take 88 mcg by mouth daily before breakfast.    . losartan (COZAAR) 25 MG tablet Take 1 tablet (25 mg total) by mouth daily. 30 tablet 2  . metFORMIN (GLUCOPHAGE) 500 MG tablet Take 500 mg by mouth 2 (two) times daily.     . metoprolol succinate (TOPROL XL) 25 MG 24 hr tablet Take 0.5 tablets (12.5 mg total) by mouth daily. 45 tablet 3  . oxymetazoline (AFRIN) 0.05 % nasal spray Place 1 spray into both nostrils 2 (two) times daily as needed for congestion.    . rosuvastatin (CRESTOR) 10 MG tablet Take 10 mg by mouth at bedtime.     . sertraline (ZOLOFT) 100 MG tablet Take 100 mg by mouth 2 (two) times daily.     No current facility-administered medications for this visit.     Allergies  Allergen Reactions  . Iodine     Face swelling, BETADINE OKAY  . Shellfish Allergy Anaphylaxis    Review of Systems  The patient had a previous Cholecystectomy and hysterectomy without any anesthetic problems or bleeding problems. The patient is left-hand dominant. The patient denies previous history of  thoracic trauma pneumothorax or rib fracture.       Review of Systems :  [ y ] = yes, [  ] = no        General :  Weight gain [   ]    Weight loss  [   ]  Fatigue [ yes ]  Fever [  ]  Chills  [  ]                                Weakness  [  ]           HEENT    Headache [  ]  Dizziness [  ]  Blurred vision [  ] Glaucoma  [  ]  Nosebleeds [  ] Painful or loose teeth [  ]        Cardiac :  Chest pain/ pressure [  ]  Resting SOB [  ] exertional SOB [ yes ]                        Orthopnea [  ]  Pedal edema  Mahler.Beck  ]  Palpitations [  ] Syncope/presyncope [ ]                         Paroxysmal nocturnal dyspnea [  ]         Pulmonary : cough [  ]  wheezing [  ]  Hemoptysis [  ] Sputum [  ] Snoring [  ]                              Pneumothorax [  ]  Sleep apnea [  ]        GI : Vomiting [  ]  Dysphagia [  ]  Melena  [  ]  Abdominal pain [  ] BRBPR [  ]              Heart burn [  ]  Constipation [  ] Diarrhea  [  ] Colonoscopy [   ]        GU : Hematuria [  ]  Dysuria [  ]  Nocturia [  ] UTI's [  ]        Vascular : Claudication [  ]  Rest pain [  ]  DVT [  ] Vein stripping [  ] leg ulcers [  ]                          TIA [  ] Stroke [  ]  Varicose veins [  ]        NEURO :  Headaches   ] Seizures [  ] Vision changes [  ] Paresthesias [  ]                                       Seizures [  ]        Musculoskeletal :  Arthritis Mahler.Beck  ] Gout  [  ]  Back pain [  ]  Joint pain [  ]        Skin :  Rash [  ]  Melanoma [  ] Sores [  ]        Heme : Bleeding problems [  ]Clotting Disorders [  ] Anemia [yes  ]Blood Transfusion [yes last month ]        Endocrine : Diabetes [ yes ] Heat or Cold intolerance [  ] Polyuria [  ]excessive thirst [ ]         Psych : Depression [  ]  Anxiety [yes on Xanax  ]  Psych hospitalizations [  ] Memory change [  ]  BP 105/64   Pulse 82   Resp 20   Ht 4\' 10"  (1.473 m)   Wt 133 lb  (60.3 kg)   SpO2 97% Comment: RA  BMI 27.80 kg/m  Physical Exam       Exam    General- alert and comfortable   Lungs- clear without rales, wheezes   Neck-no JVD carotid bruit or thyromegaly   Cor- regular rate and rhythm, 3/6 aortic stenosis murmur , no gallop   Abdomen- soft, non-tender   Extremities - warm, non-tender, minimal edema   Neuro- oriented, appropriate, no focal weakness   Diagnostic Tests: Echocardiogram demonstrates severe aortic stenosis, calcified with a LV outflow tract diameter 20 mm. She has LVH with EF 45%  Coronary arteriogram shows no significant CAD  CT scan of chest is pending but echocardiogram showed the ascending aorta to be normal at 3.0 cm  Impression: Severe aortic stenosis Bicuspid aortic valve Type 2 diabetes mellitus Anemia of unknown etiology  Plan: The patient will benefit from aortic valve replacement with a bioprosthetic aVR. AVR will help preserve LV function, improved symptoms, and improve her survival. I discussed the details of surgery including the location of the incisions, tthe use of general anesthesia and cardio pulmonary bypass, in the expected hospital recovery. She understands that there are risks involved including risks of stroke, bleeding, infection, postoperative pulmonary problems including pleural effusion, possibility of permanent pacemaker,, and death. The patient understands due to her preoperative anemia she will probably need blood transfusions for surgery.  The patient understands these issues and agrees to proceed with surgery which we scheduled at [place] on August 20 August 22, MD [facility] [phone]

## 2017-02-11 ENCOUNTER — Other Ambulatory Visit: Payer: Self-pay

## 2017-02-11 ENCOUNTER — Other Ambulatory Visit: Payer: Self-pay | Admitting: *Deleted

## 2017-02-11 DIAGNOSIS — I350 Nonrheumatic aortic (valve) stenosis: Secondary | ICD-10-CM

## 2017-02-11 DIAGNOSIS — Z91041 Radiographic dye allergy status: Secondary | ICD-10-CM

## 2017-02-11 DIAGNOSIS — Z01818 Encounter for other preprocedural examination: Secondary | ICD-10-CM

## 2017-02-11 MED ORDER — PREDNISONE 20 MG PO TABS: 60.0000 mg | ORAL_TABLET | ORAL | 0 refills | Status: AC

## 2017-02-11 NOTE — Telephone Encounter (Signed)
RX for Prednisone 20 mg tablet #6 no refill called to pharm. Sig: 60 mg po the evening prior to CT Scan along with Benadryl 50 mg The morning of CT scan take 60 mg of prednisone.

## 2017-02-12 ENCOUNTER — Ambulatory Visit (HOSPITAL_COMMUNITY)
Admission: RE | Admit: 2017-02-12 | Discharge: 2017-02-12 | Disposition: A | Discharge status: Home / Self Care | Payer: BLUE CROSS/BLUE SHIELD | Source: Ambulatory Visit | Attending: Cardiothoracic Surgery | Admitting: Cardiothoracic Surgery

## 2017-02-12 ENCOUNTER — Other Ambulatory Visit: Payer: BLUE CROSS/BLUE SHIELD

## 2017-02-12 ENCOUNTER — Other Ambulatory Visit: Payer: Self-pay | Admitting: Cardiothoracic Surgery

## 2017-02-12 ENCOUNTER — Encounter: Payer: BLUE CROSS/BLUE SHIELD | Admitting: Cardiothoracic Surgery

## 2017-02-12 DIAGNOSIS — Z01818 Encounter for other preprocedural examination: Secondary | ICD-10-CM

## 2017-02-12 DIAGNOSIS — I350 Nonrheumatic aortic (valve) stenosis: Secondary | ICD-10-CM

## 2017-02-13 ENCOUNTER — Ambulatory Visit (HOSPITAL_COMMUNITY)
Admission: RE | Admit: 2017-02-13 | Discharge: 2017-02-13 | Disposition: A | Discharge status: Home / Self Care | Payer: BLUE CROSS/BLUE SHIELD | Source: Ambulatory Visit | Attending: Cardiothoracic Surgery | Admitting: Cardiothoracic Surgery

## 2017-02-13 ENCOUNTER — Encounter (HOSPITAL_COMMUNITY): Payer: Self-pay

## 2017-02-13 ENCOUNTER — Encounter (HOSPITAL_COMMUNITY)
Admission: RE | Admit: 2017-02-13 | Discharge: 2017-02-13 | Disposition: A | Discharge status: Home / Self Care | Payer: BLUE CROSS/BLUE SHIELD | Source: Ambulatory Visit | Attending: Cardiothoracic Surgery | Admitting: Cardiothoracic Surgery

## 2017-02-13 DIAGNOSIS — I6523 Occlusion and stenosis of bilateral carotid arteries: Secondary | ICD-10-CM | POA: Diagnosis not present

## 2017-02-13 DIAGNOSIS — I350 Nonrheumatic aortic (valve) stenosis: Secondary | ICD-10-CM

## 2017-02-13 DIAGNOSIS — I700 Atherosclerosis of aorta: Secondary | ICD-10-CM | POA: Insufficient documentation

## 2017-02-13 DIAGNOSIS — Z01810 Encounter for preprocedural cardiovascular examination: Secondary | ICD-10-CM | POA: Insufficient documentation

## 2017-02-13 HISTORY — DX: Unspecified osteoarthritis, unspecified site: M19.90

## 2017-02-13 HISTORY — DX: Irritable bowel syndrome, unspecified: K58.9

## 2017-02-13 LAB — PULMONARY FUNCTION TEST
DL/VA % pred: 94 %
DL/VA: 3.69 ml/min/mmHg/L
DLCO cor % pred: 85 %
DLCO cor: 13.87 ml/min/mmHg
DLCO unc % pred: 78 %
DLCO unc: 12.78 ml/min/mmHg
FEF 25-75 Post: 2.44 L/sec
FEF 25-75 Pre: 2.92 L/sec
FEF2575-%Change-Post: -16 %
FEF2575-%Pred-Post: 149 %
FEF2575-%Pred-Pre: 179 %
FEV1-%Change-Post: -2 %
FEV1-%Pred-Post: 103 %
FEV1-%Pred-Pre: 106 %
FEV1-Post: 1.83 L
FEV1-Pre: 1.88 L
FEV1FVC-%Change-Post: 3 %
FEV1FVC-%Pred-Pre: 115 %
FEV6-%Change-Post: -5 %
FEV6-%Pred-Post: 90 %
FEV6-%Pred-Pre: 95 %
FEV6-Post: 2.02 L
FEV6-Pre: 2.14 L
FEV6FVC-%Pred-Post: 105 %
FEV6FVC-%Pred-Pre: 105 %
FVC-%Change-Post: -5 %
FVC-%Pred-Post: 86 %
FVC-%Pred-Pre: 91 %
FVC-Post: 2.02 L
FVC-Pre: 2.14 L
Post FEV1/FVC ratio: 91 %
Post FEV6/FVC ratio: 100 %
Pre FEV1/FVC ratio: 88 %
Pre FEV6/FVC Ratio: 100 %
RV % pred: 63 %
RV: 1.18 L
TLC % pred: 80 %
TLC: 3.33 L

## 2017-02-13 LAB — CBC
HCT: 38.4 % (ref 36.0–46.0)
Hemoglobin: 12.4 g/dL (ref 12.0–15.0)
MCH: 25.1 pg — ABNORMAL LOW (ref 26.0–34.0)
MCHC: 32.3 g/dL (ref 30.0–36.0)
MCV: 77.6 fL — ABNORMAL LOW (ref 78.0–100.0)
Platelets: 136 10*3/uL — ABNORMAL LOW (ref 150–400)
RBC: 4.95 MIL/uL (ref 3.87–5.11)
RDW: 20.5 % — ABNORMAL HIGH (ref 11.5–15.5)
WBC: 9.8 10*3/uL (ref 4.0–10.5)

## 2017-02-13 LAB — BLOOD GAS, ARTERIAL
Acid-base deficit: 0.1 mmol/L (ref 0.0–2.0)
Bicarbonate: 24 mmol/L (ref 20.0–28.0)
Drawn by: 421801
FIO2: 21
O2 Saturation: 94.2 %
Patient temperature: 98.6
pCO2 arterial: 39.1 mmHg (ref 32.0–48.0)
pH, Arterial: 7.406 (ref 7.350–7.450)
pO2, Arterial: 73 mmHg — ABNORMAL LOW (ref 83.0–108.0)

## 2017-02-13 LAB — COMPREHENSIVE METABOLIC PANEL
ALT: 25 U/L (ref 14–54)
AST: 53 U/L — ABNORMAL HIGH (ref 15–41)
Albumin: 3.6 g/dL (ref 3.5–5.0)
Alkaline Phosphatase: 99 U/L (ref 38–126)
Anion gap: 11 (ref 5–15)
BUN: 14 mg/dL (ref 6–20)
CO2: 23 mmol/L (ref 22–32)
Calcium: 9 mg/dL (ref 8.9–10.3)
Chloride: 104 mmol/L (ref 101–111)
Creatinine, Ser: 0.82 mg/dL (ref 0.44–1.00)
GFR calc Af Amer: >60 mL/min (ref >60)
GFR calc non Af Amer: >60 mL/min (ref >60)
Glucose, Bld: 130 mg/dL — ABNORMAL HIGH (ref 65–99)
Potassium: 3.7 mmol/L (ref 3.5–5.1)
Sodium: 138 mmol/L (ref 135–145)
Total Bilirubin: 0.5 mg/dL (ref 0.3–1.2)
Total Protein: 7.8 g/dL (ref 6.5–8.1)

## 2017-02-13 LAB — VAS US DOPPLER PRE CABG
LEFT ECA DIAS: -10 cm/s
LEFT VERTEBRAL DIAS: -10 cm/s
Left CCA dist dias: -11 cm/s
Left CCA dist sys: -65 cm/s
Left CCA prox dias: -6 cm/s
Left CCA prox sys: -64 cm/s
Left ICA dist sys: -13 cm/s
Left ICA prox dias: -15 cm/s
Left ICA prox sys: -53 cm/s
RIGHT ECA DIAS: -6 cm/s
RIGHT VERTEBRAL DIAS: -4 cm/s
Right CCA prox dias: -8 cm/s
Right CCA prox sys: -65 cm/s
Right cca dist sys: -57 cm/s

## 2017-02-13 LAB — URINALYSIS, ROUTINE W REFLEX MICROSCOPIC
Bilirubin Urine: NEGATIVE
Glucose, UA: NEGATIVE mg/dL
Hgb urine dipstick: NEGATIVE
Ketones, ur: NEGATIVE mg/dL
Nitrite: NEGATIVE
Protein, ur: NEGATIVE mg/dL
Specific Gravity, Urine: 1.004 — ABNORMAL LOW (ref 1.005–1.030)
pH: 7 (ref 5.0–8.0)

## 2017-02-13 LAB — APTT: aPTT: 30 seconds (ref 24–36)

## 2017-02-13 LAB — TYPE AND SCREEN
ABO/RH(D): B POS
Antibody Screen: NEGATIVE

## 2017-02-13 LAB — HEMOGLOBIN A1C
Hgb A1c MFr Bld: 7.7 % — ABNORMAL HIGH (ref 4.8–5.6)
Mean Plasma Glucose: 174.29 mg/dL

## 2017-02-13 LAB — GLUCOSE, CAPILLARY: Glucose-Capillary: 120 mg/dL — ABNORMAL HIGH (ref 65–99)

## 2017-02-13 LAB — SURGICAL PCR SCREEN
MRSA, PCR: NEGATIVE
Staphylococcus aureus: NEGATIVE

## 2017-02-13 LAB — PROTIME-INR
INR: 1.12
Prothrombin Time: 14.5 seconds (ref 11.4–15.2)

## 2017-02-13 MED ORDER — ALBUTEROL SULFATE (2.5 MG/3ML) 0.083% IN NEBU
2.5000 mg | INHALATION_SOLUTION | Freq: Once | RESPIRATORY_TRACT | Status: AC
  Administered 2017-02-13: 2.5 mg via RESPIRATORY_TRACT

## 2017-02-13 NOTE — Pre-Procedure Instructions (Signed)
Jenny [name]  02/13/2017    Your procedure is scheduled on Monday, August 20.  Report to [facility] Admitting at 5:30 AM               Your surgery or procedure is scheduled for 7:30 AM   Call this number if you have problems the morning of surgery: [phone]   Remember:  Do not eat food or drink liquids after midnight Sunday, August 19.  Take these medicines the morning of surgery with A SIP OF WATER: levothyroxine (SYNTHROID, LEVOTHROID),  metoprolol succinate (TOPROL XL).  DO Not take Medications for diabetes the morning of surgery.                   May take Xanax , tylenol if needed.               1 Week prior to surgery STOP taking Aspirin, Aspirin Products (Goody Powder, Excedrin Migraine), Ibuprofen (Advil), Naproxen (Aleve), Vitamins and Herbal Products (ie Fish Oil).  Special Instructions:  [place]- Preparing For Surgery  Before surgery, you can play an important role. Because skin is not sterile, your skin needs to be as free of germs as possible. You can reduce the number of germs on your skin by washing with CHG (chlorahexidine gluconate) Soap before surgery.  CHG is an antiseptic cleaner which kills germs and bonds with the skin to continue killing germs even after washing.  Please do not use if you have an allergy to CHG or antibacterial soaps. If your skin becomes reddened/irritated stop using the CHG.  Do not shave (including legs and underarms) for at least 48 hours prior to first CHG shower. It is OK to shave your face.  Please follow these instructions carefully.   1. Shower the NIGHT BEFORE SURGERY and the MORNING OF SURGERY with CHG.   2. If you chose to wash your hair, wash your hair first as usual with your normal shampoo.  3. After you shampoo, rinse your hair and body thoroughly to remove the shampoo. Wash your face and private area with the soap you use at home, then rinse.  4. Use CHG as you would any other liquid soap. You can  apply CHG directly to the skin and wash gently with a scrungie or a clean washcloth.   5. Apply the CHG Soap to your body ONLY FROM THE NECK DOWN.  Do not use on open wounds or open sores. Avoid contact with your eyes, ears, mouth and genitals (private parts). Wash genitals (private parts) with your normal soap.  6. Wash thoroughly, paying special attention to the area where your surgery will be performed.  7. Thoroughly rinse your body with warm water from the neck down.  8. DO NOT shower/wash with your normal soap after using and rinsing off the CHG Soap.  9. Pat yourself dry with a CLEAN TOWEL.   10. Wear CLEAN PAJAMAS   11. Place CLEAN SHEETS on your bed the night of your first shower and DO NOT SLEEP WITH PETS.  Day of Surgery: shower as above Do not apply any deodorants/lotions, powders or cologne. Please wear clean clothes to the hospital/surgery center.  Do not wear jewelry, make-up or nail polish.  Do not shave 48 hours prior to surgery.  Men may shave face and neck.  Do not bring valuables to the hospital.  [facility] is not responsible for any belongings or valuables.  Contacts, dentures or bridgework  may not be worn into surgery.  Leave your suitcase in the car.  After surgery it may be brought to your room.  For patients admitted to the hospital, discharge time will be determined by your treatment team.  Please read over the following fact sheets that you were given: Pain Booklet, Patient Instructions for Mupirocin Application, Incentive Spirometry, Surgical Site Infections.

## 2017-02-13 NOTE — Progress Notes (Addendum)
  How to Manage Your Diabetes Before and After Surgery  Why is it important to control my blood sugar before and after surgery? . Improving blood sugar levels before and after surgery helps healing and can limit problems. . A way of improving blood sugar control is eating a healthy diet by: o  Eating less sugar and carbohydrates o  Increasing activity/exercise o  Talking with your doctor about reaching your blood sugar goals . High blood sugars (greater than 180 mg/dL) can raise your risk of infections and slow your recovery, so you will need to focus on controlling your diabetes during the weeks before surgery. . Make sure that the doctor who takes care of your diabetes knows about your planned surgery including the date and location.  How do I manage my blood sugar before surgery? . Check your blood sugar at least 4 times a day, starting 2 days before surgery, to make sure that the level is not too high or low. o Check your blood sugar the morning of your surgery when you wake up and every 2 hours until you get to the Short Stay unit. . If your blood sugar is less than 70 mg/dL, you will need to treat for low blood sugar: o Do not take insulin. o Treat a low blood sugar (less than 70 mg/dL) with  cup of clear juice (cranberry or apple), 4 glucose tablets, OR glucose gel. o Recheck blood sugar in 15 minutes after treatment (to make sure it is greater than 70 mg/dL). If your blood sugar is not greater than 70 mg/dL on recheck, call [phone] for further instructions. . Report your blood sugar to the short stay nurse when you get to Short Stay.  . If you are admitted to the hospital after surgery: o Your blood sugar will be checked by the staff and you will probably be given insulin after surgery (instead of oral diabetes medicines) to make sure you have good blood sugar levels. o The goal for blood sugar control after surgery is 80-180 mg/dL.  Marland Kitchen WHAT DO I DO ABOUT MY DIABETES  MEDICATION .  Marland Kitchen Do not take oral diabetes medicines (pills) the morning of surgery.      . THE MORNING OF SURGERY, take 10 units of Lantus Insulin.  Patient Signature:  Date:   Nurse Signature:  Date:

## 2017-02-13 NOTE — Progress Notes (Signed)
Pre-op Cardiac Surgery  Carotid Findings:   Findings are consistent with a 1-39 percent stenosis involving the right internal carotid artery and the left internal carotid artery. The vertebral arteries demonstrate antegrade flow.  Upper Extremity Right Left  Brachial Pressures 110  Triphasic 110  Triphasic  Radial Waveforms Triphasic Triphasic  Ulnar Waveforms Triphasic Triphasic  Palmar Arch (Allen's Test) Palmar waveforms are decreased greater than fifty percent with radial compression and are obliterated with ulnar compression. Palmar waveforms are decreased greater than fifty percent with radial compression and are obliterated with ulnar compression.   02/13/17 3:58 PM Olen Cordial RVT

## 2017-02-15 MED ORDER — CHLORHEXIDINE GLUCONATE 0.12 % MT SOLN
15.0000 mL | Freq: Once | OROMUCOSAL | Status: AC
  Administered 2017-02-17: 15 mL via OROMUCOSAL
  Filled 2017-02-15: qty 15

## 2017-02-15 MED ORDER — METOPROLOL TARTRATE 12.5 MG HALF TABLET: 12.5000 mg | ORAL_TABLET | Freq: Once | ORAL | Status: DC

## 2017-02-16 MED ORDER — TRANEXAMIC ACID (OHS) BOLUS VIA INFUSION
15.0000 mg/kg | INTRAVENOUS | Status: AC
  Administered 2017-02-17: 924 mg via INTRAVENOUS
  Filled 2017-02-16: qty 924

## 2017-02-16 MED ORDER — PLASMA-LYTE 148 IV SOLN
INTRAVENOUS | Status: AC
  Administered 2017-02-17: 500 mL
  Filled 2017-02-16: qty 2.5

## 2017-02-16 MED ORDER — SODIUM CHLORIDE 0.9 % IV SOLN
INTRAVENOUS | Status: AC
  Administered 2017-02-17: 1 [IU]/h via INTRAVENOUS
  Filled 2017-02-16: qty 1

## 2017-02-16 MED ORDER — EPINEPHRINE PF 1 MG/ML IJ SOLN
0.0000 ug/min | INTRAVENOUS | Status: DC
  Filled 2017-02-16: qty 4

## 2017-02-16 MED ORDER — DOPAMINE-DEXTROSE 3.2-5 MG/ML-% IV SOLN
0.0000 ug/kg/min | INTRAVENOUS | Status: DC
  Filled 2017-02-16: qty 250

## 2017-02-16 MED ORDER — SODIUM CHLORIDE 0.9 % IV SOLN
30.0000 ug/min | INTRAVENOUS | Status: AC
  Administered 2017-02-17: 5 ug/min via INTRAVENOUS
  Filled 2017-02-16: qty 2

## 2017-02-16 MED ORDER — MAGNESIUM SULFATE 50 % IJ SOLN
40.0000 meq | INTRAMUSCULAR | Status: DC
  Filled 2017-02-16: qty 10

## 2017-02-16 MED ORDER — DEXTROSE 5 % IV SOLN
1.5000 g | INTRAVENOUS | Status: AC
  Administered 2017-02-17: .75 g via INTRAVENOUS
  Administered 2017-02-17: 1.5 g via INTRAVENOUS
  Filled 2017-02-16: qty 1.5

## 2017-02-16 MED ORDER — DEXTROSE 5 % IV SOLN
750.0000 mg | INTRAVENOUS | Status: DC
  Filled 2017-02-16: qty 750

## 2017-02-16 MED ORDER — TRANEXAMIC ACID (OHS) PUMP PRIME SOLUTION
2.0000 mg/kg | INTRAVENOUS | Status: DC
  Filled 2017-02-16: qty 1.23

## 2017-02-16 MED ORDER — TRANEXAMIC ACID 1000 MG/10ML IV SOLN
1.5000 mg/kg/h | INTRAVENOUS | Status: AC
  Administered 2017-02-17: 1.5 mg/kg/h via INTRAVENOUS
  Filled 2017-02-16: qty 25

## 2017-02-16 MED ORDER — VANCOMYCIN HCL 10 G IV SOLR
1250.0000 mg | INTRAVENOUS | Status: AC
  Administered 2017-02-17: 1250 mg via INTRAVENOUS
  Filled 2017-02-16: qty 1250

## 2017-02-16 MED ORDER — NITROGLYCERIN IN D5W 200-5 MCG/ML-% IV SOLN
2.0000 ug/min | INTRAVENOUS | Status: DC
  Filled 2017-02-16: qty 250

## 2017-02-16 MED ORDER — SODIUM CHLORIDE 0.9 % IV SOLN
INTRAVENOUS | Status: DC
  Filled 2017-02-16: qty 30

## 2017-02-16 MED ORDER — DEXMEDETOMIDINE HCL IN NACL 400 MCG/100ML IV SOLN
0.1000 ug/kg/h | INTRAVENOUS | Status: AC
  Administered 2017-02-17: 0.7 ug/kg/h via INTRAVENOUS
  Filled 2017-02-16: qty 100

## 2017-02-16 MED ORDER — POTASSIUM CHLORIDE 2 MEQ/ML IV SOLN
80.0000 meq | INTRAVENOUS | Status: DC
  Filled 2017-02-16: qty 40

## 2017-02-16 NOTE — Anesthesia Preprocedure Evaluation (Addendum)
Anesthesia Evaluation  Patient identified by MRN, date of birth, ID band Patient awake    Reviewed: Allergy & Precautions, NPO status , Patient's Chart, lab work & pertinent test results  Airway Mallampati: II  TM Distance: >3 FB Neck ROM: Full    Dental  (+) Teeth Intact, Dental Advisory Given   Pulmonary former smoker,    Pulmonary exam normal breath sounds clear to auscultation       Cardiovascular hypertension, Pt. on medications and Pt. on home beta blockers +CHF  + Valvular Problems/Murmurs AS  Rhythm:Regular Rate:Normal + Systolic murmurs Echo 01/15/17: Study Conclusions  - Left ventricle: The cavity size was normal. Systolic function was mildly reduced. The estimated ejection fraction was in the range of 45% to 50%. Probable hypokinesis of the mid-apicalanteroseptal, anterior, and apical myocardium. Features are consistent with a pseudonormal left ventricular filling pattern, with concomitant abnormal relaxation and increased   filling pressure (grade 2 diastolic dysfunction). - Aortic valve: Possibly bicuspid; moderately thickened, severely calcified leaflets. There was severe stenosis. Mean gradient (S): 47 mm Hg. Valve area (VTI): 0.52 cm^2. - Mitral valve: There was mild regurgitation. - Left atrium: The atrium was mildly dilated.   Neuro/Psych PSYCHIATRIC DISORDERS Anxiety Depression TIACVA    GI/Hepatic negative GI ROS, Neg liver ROS,   Endo/Other  diabetes, Type 2, Insulin Dependent, Oral Hypoglycemic AgentsHypothyroidism   Renal/GU negative Renal ROS     Musculoskeletal  (+) Arthritis ,   Abdominal   Peds  Hematology negative hematology ROS (+)   Anesthesia Other Findings Day of surgery medications reviewed with the patient.  Reproductive/Obstetrics                            Anesthesia Physical Anesthesia Plan  ASA: IV  Anesthesia Plan: General   Post-op Pain  Management:    Induction: Intravenous  PONV Risk Score and Plan: 3 and Midazolam and Treatment may vary due to age or medical condition  Airway Management Planned: Oral ETT  Additional Equipment: Arterial line, CVP, PA Cath, TEE and Ultrasound Guidance Line Placement  Intra-op Plan:   Post-operative Plan: Post-operative intubation/ventilation  Informed Consent: I have reviewed the patients History and Physical, chart, labs and discussed the procedure including the risks, benefits and alternatives for the proposed anesthesia with the patient or authorized representative who has indicated his/her understanding and acceptance.   Dental advisory given  Plan Discussed with:   Anesthesia Plan Comments:        Anesthesia Quick Evaluation

## 2017-02-17 ENCOUNTER — Inpatient Hospital Stay (HOSPITAL_COMMUNITY): Payer: BLUE CROSS/BLUE SHIELD | Admitting: Anesthesiology

## 2017-02-17 ENCOUNTER — Encounter (HOSPITAL_COMMUNITY): Payer: Self-pay | Admitting: Critical Care Medicine

## 2017-02-17 ENCOUNTER — Inpatient Hospital Stay (HOSPITAL_COMMUNITY): Payer: BLUE CROSS/BLUE SHIELD

## 2017-02-17 ENCOUNTER — Inpatient Hospital Stay (HOSPITAL_COMMUNITY)
Admission: RE | Admit: 2017-02-17 | Discharge: 2017-02-24 | DRG: 220 | Disposition: A | Discharge status: Home / Self Care | Payer: BLUE CROSS/BLUE SHIELD | Source: Ambulatory Visit | Attending: Cardiothoracic Surgery | Admitting: Cardiothoracic Surgery

## 2017-02-17 ENCOUNTER — Encounter (HOSPITAL_COMMUNITY)
Admission: RE | Disposition: A | Discharge status: Home / Self Care | Payer: Self-pay | Source: Ambulatory Visit | Attending: Cardiothoracic Surgery

## 2017-02-17 DIAGNOSIS — E119 Type 2 diabetes mellitus without complications: Secondary | ICD-10-CM | POA: Diagnosis present

## 2017-02-17 DIAGNOSIS — D62 Acute posthemorrhagic anemia: Secondary | ICD-10-CM | POA: Diagnosis not present

## 2017-02-17 DIAGNOSIS — E039 Hypothyroidism, unspecified: Secondary | ICD-10-CM | POA: Diagnosis present

## 2017-02-17 DIAGNOSIS — Z952 Presence of prosthetic heart valve: Secondary | ICD-10-CM

## 2017-02-17 DIAGNOSIS — Q231 Congenital insufficiency of aortic valve: Secondary | ICD-10-CM | POA: Diagnosis not present

## 2017-02-17 DIAGNOSIS — I5022 Chronic systolic (congestive) heart failure: Secondary | ICD-10-CM | POA: Diagnosis present

## 2017-02-17 DIAGNOSIS — Z7984 Long term (current) use of oral hypoglycemic drugs: Secondary | ICD-10-CM | POA: Diagnosis not present

## 2017-02-17 DIAGNOSIS — I350 Nonrheumatic aortic (valve) stenosis: Secondary | ICD-10-CM | POA: Diagnosis present

## 2017-02-17 DIAGNOSIS — F410 Panic disorder [episodic paroxysmal anxiety] without agoraphobia: Secondary | ICD-10-CM | POA: Diagnosis present

## 2017-02-17 DIAGNOSIS — Z961 Presence of intraocular lens: Secondary | ICD-10-CM | POA: Diagnosis present

## 2017-02-17 DIAGNOSIS — D689 Coagulation defect, unspecified: Secondary | ICD-10-CM | POA: Diagnosis present

## 2017-02-17 DIAGNOSIS — I088 Other rheumatic multiple valve diseases: Secondary | ICD-10-CM | POA: Diagnosis not present

## 2017-02-17 DIAGNOSIS — D509 Iron deficiency anemia, unspecified: Secondary | ICD-10-CM | POA: Diagnosis present

## 2017-02-17 DIAGNOSIS — Z79899 Other long term (current) drug therapy: Secondary | ICD-10-CM | POA: Diagnosis not present

## 2017-02-17 DIAGNOSIS — Z9049 Acquired absence of other specified parts of digestive tract: Secondary | ICD-10-CM

## 2017-02-17 DIAGNOSIS — Z87891 Personal history of nicotine dependence: Secondary | ICD-10-CM | POA: Diagnosis not present

## 2017-02-17 DIAGNOSIS — I5043 Acute on chronic combined systolic (congestive) and diastolic (congestive) heart failure: Secondary | ICD-10-CM | POA: Diagnosis not present

## 2017-02-17 DIAGNOSIS — Z8249 Family history of ischemic heart disease and other diseases of the circulatory system: Secondary | ICD-10-CM

## 2017-02-17 DIAGNOSIS — Z09 Encounter for follow-up examination after completed treatment for conditions other than malignant neoplasm: Secondary | ICD-10-CM

## 2017-02-17 DIAGNOSIS — Z8673 Personal history of transient ischemic attack (TIA), and cerebral infarction without residual deficits: Secondary | ICD-10-CM | POA: Diagnosis not present

## 2017-02-17 DIAGNOSIS — D6959 Other secondary thrombocytopenia: Secondary | ICD-10-CM | POA: Diagnosis present

## 2017-02-17 DIAGNOSIS — F329 Major depressive disorder, single episode, unspecified: Secondary | ICD-10-CM | POA: Diagnosis present

## 2017-02-17 DIAGNOSIS — Z9841 Cataract extraction status, right eye: Secondary | ICD-10-CM

## 2017-02-17 DIAGNOSIS — Z90710 Acquired absence of both cervix and uterus: Secondary | ICD-10-CM | POA: Diagnosis not present

## 2017-02-17 DIAGNOSIS — R931 Abnormal findings on diagnostic imaging of heart and coronary circulation: Secondary | ICD-10-CM | POA: Diagnosis not present

## 2017-02-17 DIAGNOSIS — I509 Heart failure, unspecified: Secondary | ICD-10-CM | POA: Diagnosis not present

## 2017-02-17 HISTORY — PX: TEE WITHOUT CARDIOVERSION: SHX5443

## 2017-02-17 HISTORY — PX: AORTIC VALVE REPLACEMENT: SHX41

## 2017-02-17 LAB — CBC
HCT: 17.5 % — ABNORMAL LOW (ref 36.0–46.0)
HEMATOCRIT: 25.8 % — AB (ref 36.0–46.0)
Hemoglobin: 8.3 g/dL — ABNORMAL LOW (ref 12.0–15.0)
Hemoglobin: 5.5 g/dL — CL (ref 12.0–15.0)
MCH: 24.9 pg — ABNORMAL LOW (ref 26.0–34.0)
MCH: 24.7 pg — ABNORMAL LOW (ref 26.0–34.0)
MCHC: 32.2 g/dL (ref 30.0–36.0)
MCHC: 31.4 g/dL (ref 30.0–36.0)
MCV: 77.5 fL — ABNORMAL LOW (ref 78.0–100.0)
MCV: 78.5 fL (ref 78.0–100.0)
PLATELETS: 94 10*3/uL — AB (ref 150–400)
Platelets: 76 10*3/uL — ABNORMAL LOW (ref 150–400)
RBC: 3.33 MIL/uL — ABNORMAL LOW (ref 3.87–5.11)
RBC: 2.23 MIL/uL — ABNORMAL LOW (ref 3.87–5.11)
RDW: 20.5 % — AB (ref 11.5–15.5)
RDW: 21 % — ABNORMAL HIGH (ref 11.5–15.5)
WBC: 10.7 10*3/uL — AB (ref 4.0–10.5)
WBC: 9.6 10*3/uL (ref 4.0–10.5)

## 2017-02-17 LAB — POCT I-STAT 3, ART BLOOD GAS (G3+)
Acid-Base Excess: 6 mmol/L — ABNORMAL HIGH (ref 0.0–2.0)
Acid-Base Excess: 5 mmol/L — ABNORMAL HIGH (ref 0.0–2.0)
Acid-base deficit: 2 mmol/L (ref 0.0–2.0)
Acid-base deficit: 4 mmol/L — ABNORMAL HIGH (ref 0.0–2.0)
Acid-base deficit: 1 mmol/L (ref 0.0–2.0)
Acid-base deficit: 4 mmol/L — ABNORMAL HIGH (ref 0.0–2.0)
Acid-base deficit: 2 mmol/L (ref 0.0–2.0)
Bicarbonate: 28.5 mmol/L — ABNORMAL HIGH (ref 20.0–28.0)
Bicarbonate: 28.4 mmol/L — ABNORMAL HIGH (ref 20.0–28.0)
Bicarbonate: 23.3 mmol/L (ref 20.0–28.0)
Bicarbonate: 22.5 mmol/L (ref 20.0–28.0)
Bicarbonate: 23.7 mmol/L (ref 20.0–28.0)
Bicarbonate: 22.7 mmol/L (ref 20.0–28.0)
Bicarbonate: 23.2 mmol/L (ref 20.0–28.0)
O2 Saturation: 100 %
O2 Saturation: 100 %
O2 Saturation: 99 %
O2 Saturation: 98 %
O2 Saturation: 99 %
O2 Saturation: 98 %
O2 Saturation: 97 %
Patient temperature: 35.3
Patient temperature: 36.5
Patient temperature: 37
Patient temperature: 37.5
Patient temperature: 37.7
TCO2: 30 mmol/L (ref 0–100)
TCO2: 30 mmol/L (ref 0–100)
TCO2: 25 mmol/L (ref 0–100)
TCO2: 24 mmol/L (ref 0–100)
TCO2: 25 mmol/L (ref 0–100)
TCO2: 24 mmol/L (ref 0–100)
TCO2: 24 mmol/L (ref 0–100)
pCO2 arterial: 33.8 mmHg (ref 32.0–48.0)
pCO2 arterial: 37.2 mmHg (ref 32.0–48.0)
pCO2 arterial: 41 mmHg (ref 32.0–48.0)
pCO2 arterial: 48.4 mmHg — ABNORMAL HIGH (ref 32.0–48.0)
pCO2 arterial: 37.5 mmHg (ref 32.0–48.0)
pCO2 arterial: 47.2 mmHg (ref 32.0–48.0)
pCO2 arterial: 43.7 mmHg (ref 32.0–48.0)
pH, Arterial: 7.534 — ABNORMAL HIGH (ref 7.350–7.450)
pH, Arterial: 7.492 — ABNORMAL HIGH (ref 7.350–7.450)
pH, Arterial: 7.355 (ref 7.350–7.450)
pH, Arterial: 7.272 — ABNORMAL LOW (ref 7.350–7.450)
pH, Arterial: 7.409 (ref 7.350–7.450)
pH, Arterial: 7.292 — ABNORMAL LOW (ref 7.350–7.450)
pH, Arterial: 7.336 — ABNORMAL LOW (ref 7.350–7.450)
pO2, Arterial: 391 mmHg — ABNORMAL HIGH (ref 83.0–108.0)
pO2, Arterial: 438 mmHg — ABNORMAL HIGH (ref 83.0–108.0)
pO2, Arterial: 127 mmHg — ABNORMAL HIGH (ref 83.0–108.0)
pO2, Arterial: 122 mmHg — ABNORMAL HIGH (ref 83.0–108.0)
pO2, Arterial: 158 mmHg — ABNORMAL HIGH (ref 83.0–108.0)
pO2, Arterial: 120 mmHg — ABNORMAL HIGH (ref 83.0–108.0)
pO2, Arterial: 101 mmHg (ref 83.0–108.0)

## 2017-02-17 LAB — CREATININE, SERUM
Creatinine, Ser: 0.57 mg/dL (ref 0.44–1.00)
GFR calc Af Amer: >60 mL/min (ref >60)
GFR calc non Af Amer: >60 mL/min (ref >60)

## 2017-02-17 LAB — POCT I-STAT, CHEM 8
BUN: 7 mg/dL (ref 6–20)
BUN: 4 mg/dL — ABNORMAL LOW (ref 6–20)
BUN: 6 mg/dL (ref 6–20)
BUN: 5 mg/dL — ABNORMAL LOW (ref 6–20)
BUN: 5 mg/dL — ABNORMAL LOW (ref 6–20)
BUN: 5 mg/dL — ABNORMAL LOW (ref 6–20)
BUN: 5 mg/dL — ABNORMAL LOW (ref 6–20)
BUN: 4 mg/dL — ABNORMAL LOW (ref 6–20)
Calcium, Ion: 1.25 mmol/L (ref 1.15–1.40)
Calcium, Ion: 0.97 mmol/L — ABNORMAL LOW (ref 1.15–1.40)
Calcium, Ion: 1.24 mmol/L (ref 1.15–1.40)
Calcium, Ion: 1.07 mmol/L — ABNORMAL LOW (ref 1.15–1.40)
Calcium, Ion: 1.07 mmol/L — ABNORMAL LOW (ref 1.15–1.40)
Calcium, Ion: 1.07 mmol/L — ABNORMAL LOW (ref 1.15–1.40)
Calcium, Ion: 1.09 mmol/L — ABNORMAL LOW (ref 1.15–1.40)
Calcium, Ion: 1.16 mmol/L (ref 1.15–1.40)
Chloride: 101 mmol/L (ref 101–111)
Chloride: 101 mmol/L (ref 101–111)
Chloride: 104 mmol/L (ref 101–111)
Chloride: 103 mmol/L (ref 101–111)
Chloride: 104 mmol/L (ref 101–111)
Chloride: 104 mmol/L (ref 101–111)
Chloride: 105 mmol/L (ref 101–111)
Chloride: 109 mmol/L (ref 101–111)
Creatinine, Ser: 0.3 mg/dL — ABNORMAL LOW (ref 0.44–1.00)
Creatinine, Ser: 0.3 mg/dL — ABNORMAL LOW (ref 0.44–1.00)
Creatinine, Ser: 0.3 mg/dL — ABNORMAL LOW (ref 0.44–1.00)
Creatinine, Ser: 0.3 mg/dL — ABNORMAL LOW (ref 0.44–1.00)
Creatinine, Ser: 0.3 mg/dL — ABNORMAL LOW (ref 0.44–1.00)
Creatinine, Ser: 0.4 mg/dL — ABNORMAL LOW (ref 0.44–1.00)
Creatinine, Ser: 0.4 mg/dL — ABNORMAL LOW (ref 0.44–1.00)
Creatinine, Ser: 0.4 mg/dL — ABNORMAL LOW (ref 0.44–1.00)
Glucose, Bld: 225 mg/dL — ABNORMAL HIGH (ref 65–99)
Glucose, Bld: 146 mg/dL — ABNORMAL HIGH (ref 65–99)
Glucose, Bld: 181 mg/dL — ABNORMAL HIGH (ref 65–99)
Glucose, Bld: 157 mg/dL — ABNORMAL HIGH (ref 65–99)
Glucose, Bld: 157 mg/dL — ABNORMAL HIGH (ref 65–99)
Glucose, Bld: 184 mg/dL — ABNORMAL HIGH (ref 65–99)
Glucose, Bld: 229 mg/dL — ABNORMAL HIGH (ref 65–99)
Glucose, Bld: 114 mg/dL — ABNORMAL HIGH (ref 65–99)
HCT: 32 % — ABNORMAL LOW (ref 36.0–46.0)
HCT: 26 % — ABNORMAL LOW (ref 36.0–46.0)
HCT: 33 % — ABNORMAL LOW (ref 36.0–46.0)
HCT: 25 % — ABNORMAL LOW (ref 36.0–46.0)
HCT: 26 % — ABNORMAL LOW (ref 36.0–46.0)
HCT: 24 % — ABNORMAL LOW (ref 36.0–46.0)
HCT: 25 % — ABNORMAL LOW (ref 36.0–46.0)
HCT: 27 % — ABNORMAL LOW (ref 36.0–46.0)
Hemoglobin: 10.9 g/dL — ABNORMAL LOW (ref 12.0–15.0)
Hemoglobin: 11.2 g/dL — ABNORMAL LOW (ref 12.0–15.0)
Hemoglobin: 8.8 g/dL — ABNORMAL LOW (ref 12.0–15.0)
Hemoglobin: 8.5 g/dL — ABNORMAL LOW (ref 12.0–15.0)
Hemoglobin: 8.8 g/dL — ABNORMAL LOW (ref 12.0–15.0)
Hemoglobin: 8.2 g/dL — ABNORMAL LOW (ref 12.0–15.0)
Hemoglobin: 8.5 g/dL — ABNORMAL LOW (ref 12.0–15.0)
Hemoglobin: 9.2 g/dL — ABNORMAL LOW (ref 12.0–15.0)
Potassium: 3.6 mmol/L (ref 3.5–5.1)
Potassium: 3.2 mmol/L — ABNORMAL LOW (ref 3.5–5.1)
Potassium: 3.3 mmol/L — ABNORMAL LOW (ref 3.5–5.1)
Potassium: 3.6 mmol/L (ref 3.5–5.1)
Potassium: 3.7 mmol/L (ref 3.5–5.1)
Potassium: 3.4 mmol/L — ABNORMAL LOW (ref 3.5–5.1)
Potassium: 4.3 mmol/L (ref 3.5–5.1)
Potassium: 4.3 mmol/L (ref 3.5–5.1)
Sodium: 139 mmol/L (ref 135–145)
Sodium: 140 mmol/L (ref 135–145)
Sodium: 142 mmol/L (ref 135–145)
Sodium: 140 mmol/L (ref 135–145)
Sodium: 140 mmol/L (ref 135–145)
Sodium: 141 mmol/L (ref 135–145)
Sodium: 141 mmol/L (ref 135–145)
Sodium: 142 mmol/L (ref 135–145)
TCO2: 28 mmol/L (ref 0–100)
TCO2: 26 mmol/L (ref 0–100)
TCO2: 27 mmol/L (ref 0–100)
TCO2: 26 mmol/L (ref 0–100)
TCO2: 26 mmol/L (ref 0–100)
TCO2: 25 mmol/L (ref 0–100)
TCO2: 25 mmol/L (ref 0–100)
TCO2: 24 mmol/L (ref 0–100)

## 2017-02-17 LAB — GLUCOSE, CAPILLARY
Glucose-Capillary: 203 mg/dL — ABNORMAL HIGH (ref 65–99)
Glucose-Capillary: 127 mg/dL — ABNORMAL HIGH (ref 65–99)
Glucose-Capillary: 130 mg/dL — ABNORMAL HIGH (ref 65–99)
Glucose-Capillary: 111 mg/dL — ABNORMAL HIGH (ref 65–99)
Glucose-Capillary: 96 mg/dL (ref 65–99)
Glucose-Capillary: 112 mg/dL — ABNORMAL HIGH (ref 65–99)
Glucose-Capillary: 115 mg/dL — ABNORMAL HIGH (ref 65–99)
Glucose-Capillary: 113 mg/dL — ABNORMAL HIGH (ref 65–99)
Glucose-Capillary: 114 mg/dL — ABNORMAL HIGH (ref 65–99)
Glucose-Capillary: 98 mg/dL (ref 65–99)

## 2017-02-17 LAB — PLATELET COUNT: Platelets: 81 10*3/uL — ABNORMAL LOW (ref 150–400)

## 2017-02-17 LAB — MAGNESIUM: Magnesium: 3.1 mg/dL — ABNORMAL HIGH (ref 1.7–2.4)

## 2017-02-17 LAB — HEMOGLOBIN AND HEMATOCRIT, BLOOD
HCT: 23.1 % — ABNORMAL LOW (ref 36.0–46.0)
HCT: 27.2 % — ABNORMAL LOW (ref 36.0–46.0)
HEMOGLOBIN: 8.6 g/dL — AB (ref 12.0–15.0)
Hemoglobin: 7.5 g/dL — ABNORMAL LOW (ref 12.0–15.0)

## 2017-02-17 LAB — APTT: APTT: 39 s — AB (ref 24–36)

## 2017-02-17 LAB — PROTIME-INR
INR: 1.98
PROTHROMBIN TIME: 22.8 s — AB (ref 11.4–15.2)

## 2017-02-17 LAB — PREPARE RBC (CROSSMATCH)

## 2017-02-17 SURGERY — REPLACEMENT, AORTIC VALVE, OPEN
Anesthesia: General | Site: Chest

## 2017-02-17 MED ORDER — METOCLOPRAMIDE HCL 5 MG/ML IJ SOLN
10.0000 mg | Freq: Four times a day (QID) | INTRAMUSCULAR | Status: DC
  Administered 2017-02-17 – 2017-02-23 (×17): 10 mg via INTRAVENOUS
  Filled 2017-02-17 (×19): qty 2

## 2017-02-17 MED ORDER — INSULIN REGULAR BOLUS VIA INFUSION
0.0000 [IU] | Freq: Three times a day (TID) | INTRAVENOUS | Status: DC
  Filled 2017-02-17: qty 10

## 2017-02-17 MED ORDER — HEMOSTATIC AGENTS (NO CHARGE) OPTIME
TOPICAL | Status: DC | PRN
  Administered 2017-02-17: 1 via TOPICAL

## 2017-02-17 MED ORDER — ACETAMINOPHEN 500 MG PO TABS
1000.0000 mg | ORAL_TABLET | Freq: Four times a day (QID) | ORAL | Status: AC
  Administered 2017-02-17 – 2017-02-22 (×18): 1000 mg via ORAL
  Filled 2017-02-17 (×18): qty 2

## 2017-02-17 MED ORDER — LACTATED RINGERS IV SOLN
INTRAVENOUS | Status: DC | PRN
  Administered 2017-02-17: 07:00:00 via INTRAVENOUS

## 2017-02-17 MED ORDER — DOPAMINE-DEXTROSE 3.2-5 MG/ML-% IV SOLN: 0.0000 ug/kg/min | INTRAVENOUS | Status: DC

## 2017-02-17 MED ORDER — NITROGLYCERIN IN D5W 200-5 MCG/ML-% IV SOLN: 0.0000 ug/min | INTRAVENOUS | Status: DC

## 2017-02-17 MED ORDER — VANCOMYCIN HCL 10 G IV SOLR
1250.0000 mg | INTRAVENOUS | Status: AC
  Administered 2017-02-18: 1250 mg via INTRAVENOUS
  Filled 2017-02-17: qty 1250

## 2017-02-17 MED ORDER — PROPOFOL 10 MG/ML IV BOLUS
INTRAVENOUS | Status: AC
  Filled 2017-02-17: qty 20

## 2017-02-17 MED ORDER — SERTRALINE HCL 100 MG PO TABS
100.0000 mg | ORAL_TABLET | Freq: Two times a day (BID) | ORAL | Status: DC
  Administered 2017-02-18 – 2017-02-24 (×13): 100 mg via ORAL
  Filled 2017-02-17 (×13): qty 1

## 2017-02-17 MED ORDER — MORPHINE SULFATE (PF) 4 MG/ML IV SOLN
1.0000 mg | INTRAVENOUS | Status: DC | PRN
  Administered 2017-02-17 (×2): 2 mg via INTRAVENOUS
  Filled 2017-02-17: qty 1

## 2017-02-17 MED ORDER — LACTATED RINGERS IV SOLN: INTRAVENOUS | Status: DC

## 2017-02-17 MED ORDER — ROCURONIUM BROMIDE 10 MG/ML (PF) SYRINGE
PREFILLED_SYRINGE | INTRAVENOUS | Status: DC | PRN
  Administered 2017-02-17 (×2): 50 mg via INTRAVENOUS
  Administered 2017-02-17: 100 mg via INTRAVENOUS

## 2017-02-17 MED ORDER — ALBUMIN HUMAN 5 % IV SOLN
INTRAVENOUS | Status: DC | PRN
  Administered 2017-02-17: 11:00:00 via INTRAVENOUS

## 2017-02-17 MED ORDER — METOPROLOL TARTRATE 25 MG/10 ML ORAL SUSPENSION: 12.5000 mg | Freq: Two times a day (BID) | ORAL | Status: DC

## 2017-02-17 MED ORDER — ROCURONIUM BROMIDE 10 MG/ML (PF) SYRINGE
PREFILLED_SYRINGE | INTRAVENOUS | Status: AC
  Filled 2017-02-17: qty 10

## 2017-02-17 MED ORDER — CHLORHEXIDINE GLUCONATE 4 % EX LIQD: 30.0000 mL | CUTANEOUS | Status: DC

## 2017-02-17 MED ORDER — METOPROLOL TARTRATE 12.5 MG HALF TABLET
12.5000 mg | ORAL_TABLET | Freq: Two times a day (BID) | ORAL | Status: DC
  Administered 2017-02-19: 12.5 mg via ORAL
  Filled 2017-02-17: qty 1

## 2017-02-17 MED ORDER — ORAL CARE MOUTH RINSE
15.0000 mL | Freq: Four times a day (QID) | OROMUCOSAL | Status: DC
  Administered 2017-02-17 – 2017-02-23 (×13): 15 mL via OROMUCOSAL

## 2017-02-17 MED ORDER — ALPRAZOLAM 0.5 MG PO TABS
0.5000 mg | ORAL_TABLET | Freq: Every day | ORAL | Status: DC
  Administered 2017-02-18 – 2017-02-24 (×7): 0.5 mg via ORAL
  Filled 2017-02-17 (×7): qty 1

## 2017-02-17 MED ORDER — THROMBIN 20000 UNITS EX SOLR
CUTANEOUS | Status: AC
  Filled 2017-02-17: qty 20000

## 2017-02-17 MED ORDER — TRAMADOL HCL 50 MG PO TABS
50.0000 mg | ORAL_TABLET | ORAL | Status: DC | PRN
  Administered 2017-02-18 – 2017-02-23 (×6): 100 mg via ORAL
  Filled 2017-02-17 (×9): qty 2

## 2017-02-17 MED ORDER — ASPIRIN EC 325 MG PO TBEC
325.0000 mg | DELAYED_RELEASE_TABLET | Freq: Every day | ORAL | Status: DC
  Administered 2017-02-18 – 2017-02-19 (×2): 325 mg via ORAL
  Filled 2017-02-17 (×2): qty 1

## 2017-02-17 MED ORDER — SODIUM CHLORIDE 0.9 % IV SOLN
Freq: Once | INTRAVENOUS | Status: AC
  Administered 2017-02-17: 11:00:00 via INTRAVENOUS

## 2017-02-17 MED ORDER — PHENYLEPHRINE HCL 10 MG/ML IJ SOLN
INTRAVENOUS | Status: DC | PRN
  Administered 2017-02-17: 20 ug/min via INTRAVENOUS

## 2017-02-17 MED ORDER — ASPIRIN 81 MG PO CHEW: 324.0000 mg | CHEWABLE_TABLET | Freq: Every day | ORAL | Status: DC

## 2017-02-17 MED ORDER — ACETAMINOPHEN 160 MG/5ML PO SOLN: 1000.0000 mg | Freq: Four times a day (QID) | ORAL | Status: DC

## 2017-02-17 MED ORDER — LACTATED RINGERS IV SOLN
INTRAVENOUS | Status: DC | PRN
  Administered 2017-02-17 (×2): via INTRAVENOUS

## 2017-02-17 MED ORDER — HEPARIN SODIUM (PORCINE) 1000 UNIT/ML IJ SOLN
INTRAMUSCULAR | Status: AC
  Filled 2017-02-17: qty 1

## 2017-02-17 MED ORDER — ROSUVASTATIN CALCIUM 10 MG PO TABS
10.0000 mg | ORAL_TABLET | Freq: Every day | ORAL | Status: DC
  Administered 2017-02-18 – 2017-02-23 (×6): 10 mg via ORAL
  Filled 2017-02-17 (×6): qty 1

## 2017-02-17 MED ORDER — LIDOCAINE 2% (20 MG/ML) 5 ML SYRINGE
INTRAMUSCULAR | Status: AC
  Filled 2017-02-17: qty 5

## 2017-02-17 MED ORDER — VANCOMYCIN HCL IN DEXTROSE 1-5 GM/200ML-% IV SOLN: 1000.0000 mg | INTRAVENOUS | Status: DC

## 2017-02-17 MED ORDER — FENTANYL CITRATE (PF) 250 MCG/5ML IJ SOLN
INTRAMUSCULAR | Status: DC | PRN
  Administered 2017-02-17 (×2): 100 ug via INTRAVENOUS
  Administered 2017-02-17: 25 ug via INTRAVENOUS
  Administered 2017-02-17: 150 ug via INTRAVENOUS
  Administered 2017-02-17: 25 ug via INTRAVENOUS
  Administered 2017-02-17: 50 ug via INTRAVENOUS
  Administered 2017-02-17 (×5): 100 ug via INTRAVENOUS
  Administered 2017-02-17: 25 ug via INTRAVENOUS
  Administered 2017-02-17: 50 ug via INTRAVENOUS
  Administered 2017-02-17: 100 ug via INTRAVENOUS
  Administered 2017-02-17: 125 ug via INTRAVENOUS

## 2017-02-17 MED ORDER — SODIUM CHLORIDE 0.9 % IV SOLN
INTRAVENOUS | Status: DC
  Administered 2017-02-17: 2.2 [IU]/h via INTRAVENOUS
  Filled 2017-02-17 (×2): qty 1

## 2017-02-17 MED ORDER — SODIUM CHLORIDE 0.9% FLUSH: 3.0000 mL | INTRAVENOUS | Status: DC | PRN

## 2017-02-17 MED ORDER — SODIUM CHLORIDE 0.45 % IV SOLN
INTRAVENOUS | Status: DC | PRN
  Administered 2017-02-17: 12:00:00 via INTRAVENOUS

## 2017-02-17 MED ORDER — DOCUSATE SODIUM 100 MG PO CAPS
200.0000 mg | ORAL_CAPSULE | Freq: Every day | ORAL | Status: DC
  Administered 2017-02-18 – 2017-02-21 (×4): 200 mg via ORAL
  Filled 2017-02-17 (×6): qty 2

## 2017-02-17 MED ORDER — PROPOFOL 10 MG/ML IV BOLUS
INTRAVENOUS | Status: DC | PRN
  Administered 2017-02-17: 80 mg via INTRAVENOUS

## 2017-02-17 MED ORDER — FENTANYL CITRATE (PF) 250 MCG/5ML IJ SOLN
INTRAMUSCULAR | Status: AC
  Filled 2017-02-17: qty 25

## 2017-02-17 MED ORDER — LEVOTHYROXINE SODIUM 88 MCG PO TABS
88.0000 ug | ORAL_TABLET | Freq: Every day | ORAL | Status: DC
  Administered 2017-02-18 – 2017-02-24 (×7): 88 ug via ORAL
  Filled 2017-02-17 (×7): qty 1

## 2017-02-17 MED ORDER — SODIUM CHLORIDE 0.9% FLUSH
3.0000 mL | Freq: Two times a day (BID) | INTRAVENOUS | Status: DC
  Administered 2017-02-19 – 2017-02-21 (×4): 3 mL via INTRAVENOUS

## 2017-02-17 MED ORDER — SODIUM CHLORIDE 0.9 % IV SOLN: 0.0000 ug/kg/h | INTRAVENOUS | Status: DC

## 2017-02-17 MED ORDER — MORPHINE SULFATE (PF) 2 MG/ML IV SOLN: 1.0000 mg | INTRAVENOUS | Status: DC | PRN

## 2017-02-17 MED ORDER — THROMBIN 20000 UNITS EX SOLR
CUTANEOUS | Status: DC | PRN
  Administered 2017-02-17: 20000 [IU] via TOPICAL

## 2017-02-17 MED ORDER — SODIUM BICARBONATE 8.4 % IV SOLN
50.0000 meq | Freq: Once | INTRAVENOUS | Status: AC
  Administered 2017-02-17: 50 meq via INTRAVENOUS

## 2017-02-17 MED ORDER — PANTOPRAZOLE SODIUM 40 MG PO TBEC
40.0000 mg | DELAYED_RELEASE_TABLET | Freq: Every day | ORAL | Status: DC
  Administered 2017-02-18 – 2017-02-24 (×7): 40 mg via ORAL
  Filled 2017-02-17 (×7): qty 1

## 2017-02-17 MED ORDER — LACTATED RINGERS IV SOLN
INTRAVENOUS | Status: DC
  Administered 2017-02-18: 11:00:00 via INTRAVENOUS

## 2017-02-17 MED ORDER — MIDAZOLAM HCL 2 MG/2ML IJ SOLN: 2.0000 mg | INTRAMUSCULAR | Status: DC | PRN

## 2017-02-17 MED ORDER — POTASSIUM CHLORIDE 10 MEQ/50ML IV SOLN
10.0000 meq | INTRAVENOUS | Status: AC
  Administered 2017-02-17 (×3): 10 meq via INTRAVENOUS
  Filled 2017-02-17: qty 50

## 2017-02-17 MED ORDER — ALPRAZOLAM 0.5 MG PO TABS
1.0000 mg | ORAL_TABLET | Freq: Every day | ORAL | Status: DC
  Administered 2017-02-18 – 2017-02-23 (×6): 1 mg via ORAL
  Filled 2017-02-17 (×6): qty 2

## 2017-02-17 MED ORDER — MORPHINE SULFATE (PF) 4 MG/ML IV SOLN
2.0000 mg | INTRAVENOUS | Status: DC | PRN
  Administered 2017-02-18 (×2): 2 mg via INTRAVENOUS
  Filled 2017-02-17 (×4): qty 1

## 2017-02-17 MED ORDER — PROTAMINE SULFATE 10 MG/ML IV SOLN
INTRAVENOUS | Status: AC
  Filled 2017-02-17: qty 25

## 2017-02-17 MED ORDER — ONDANSETRON HCL 4 MG/2ML IJ SOLN
4.0000 mg | Freq: Four times a day (QID) | INTRAMUSCULAR | Status: DC | PRN
  Administered 2017-02-17 – 2017-02-20 (×3): 4 mg via INTRAVENOUS
  Filled 2017-02-17 (×3): qty 2

## 2017-02-17 MED ORDER — MIDAZOLAM HCL 10 MG/2ML IJ SOLN
INTRAMUSCULAR | Status: AC
  Filled 2017-02-17: qty 2

## 2017-02-17 MED ORDER — POTASSIUM CHLORIDE 10 MEQ/50ML IV SOLN
10.0000 meq | Freq: Once | INTRAVENOUS | Status: AC
  Administered 2017-02-17: 10 meq via INTRAVENOUS

## 2017-02-17 MED ORDER — ARTIFICIAL TEARS OPHTHALMIC OINT
TOPICAL_OINTMENT | OPHTHALMIC | Status: AC
  Filled 2017-02-17: qty 3.5

## 2017-02-17 MED ORDER — MAGNESIUM SULFATE 4 GM/100ML IV SOLN
4.0000 g | Freq: Once | INTRAVENOUS | Status: AC
  Administered 2017-02-17: 4 g via INTRAVENOUS
  Filled 2017-02-17: qty 100

## 2017-02-17 MED ORDER — CHLORHEXIDINE GLUCONATE 0.12 % MT SOLN
15.0000 mL | OROMUCOSAL | Status: AC
  Administered 2017-02-17: 15 mL via OROMUCOSAL

## 2017-02-17 MED ORDER — CHLORHEXIDINE GLUCONATE 0.12% ORAL RINSE (MEDLINE KIT)
15.0000 mL | Freq: Two times a day (BID) | OROMUCOSAL | Status: DC
  Administered 2017-02-17 – 2017-02-23 (×9): 15 mL via OROMUCOSAL

## 2017-02-17 MED ORDER — ALBUMIN HUMAN 5 % IV SOLN
250.0000 mL | INTRAVENOUS | Status: AC | PRN
  Administered 2017-02-17 (×4): 250 mL via INTRAVENOUS
  Filled 2017-02-17: qty 250

## 2017-02-17 MED ORDER — ACETAMINOPHEN 650 MG RE SUPP
650.0000 mg | Freq: Once | RECTAL | Status: AC
  Administered 2017-02-17: 650 mg via RECTAL

## 2017-02-17 MED ORDER — PHENYLEPHRINE 40 MCG/ML (10ML) SYRINGE FOR IV PUSH (FOR BLOOD PRESSURE SUPPORT)
PREFILLED_SYRINGE | INTRAVENOUS | Status: AC
  Filled 2017-02-17: qty 10

## 2017-02-17 MED ORDER — DEXTROSE 5 % IV SOLN
1.5000 g | Freq: Two times a day (BID) | INTRAVENOUS | Status: AC
  Administered 2017-02-17 – 2017-02-19 (×4): 1.5 g via INTRAVENOUS
  Filled 2017-02-17 (×4): qty 1.5

## 2017-02-17 MED ORDER — SODIUM CHLORIDE 0.9 % IV SOLN
INTRAVENOUS | Status: DC
  Administered 2017-02-17: 12:00:00 via INTRAVENOUS

## 2017-02-17 MED ORDER — DESMOPRESSIN ACETATE 4 MCG/ML IJ SOLN
20.0000 ug | INTRAMUSCULAR | Status: AC
  Administered 2017-02-17: 20 ug via INTRAVENOUS
  Filled 2017-02-17: qty 5

## 2017-02-17 MED ORDER — FAMOTIDINE IN NACL 20-0.9 MG/50ML-% IV SOLN
20.0000 mg | Freq: Two times a day (BID) | INTRAVENOUS | Status: AC
  Administered 2017-02-17: 20 mg via INTRAVENOUS
  Filled 2017-02-17: qty 50

## 2017-02-17 MED ORDER — DEXMEDETOMIDINE HCL IN NACL 400 MCG/100ML IV SOLN
0.0000 ug/kg/h | INTRAVENOUS | Status: DC
  Filled 2017-02-17: qty 100

## 2017-02-17 MED ORDER — MORPHINE SULFATE (PF) 2 MG/ML IV SOLN: 2.0000 mg | INTRAVENOUS | Status: DC | PRN

## 2017-02-17 MED ORDER — BISACODYL 10 MG RE SUPP: 10.0000 mg | Freq: Every day | RECTAL | Status: DC

## 2017-02-17 MED ORDER — SODIUM CHLORIDE 0.9 % IV SOLN
0.0000 ug/min | INTRAVENOUS | Status: DC
  Administered 2017-02-18: 35 ug/min via INTRAVENOUS
  Filled 2017-02-17 (×3): qty 1

## 2017-02-17 MED ORDER — BISACODYL 5 MG PO TBEC
10.0000 mg | DELAYED_RELEASE_TABLET | Freq: Every day | ORAL | Status: DC
  Administered 2017-02-19 – 2017-02-20 (×2): 10 mg via ORAL
  Filled 2017-02-17 (×5): qty 2

## 2017-02-17 MED ORDER — PROTAMINE SULFATE 10 MG/ML IV SOLN
INTRAVENOUS | Status: DC | PRN
  Administered 2017-02-17: 10 mg via INTRAVENOUS
  Administered 2017-02-17: 210 mg via INTRAVENOUS

## 2017-02-17 MED ORDER — SODIUM CHLORIDE 0.9 % IV SOLN: 250.0000 mL | INTRAVENOUS | Status: DC

## 2017-02-17 MED ORDER — SODIUM CHLORIDE 0.9 % IJ SOLN
INTRAMUSCULAR | Status: DC | PRN
  Administered 2017-02-17 (×3): via TOPICAL

## 2017-02-17 MED ORDER — METOPROLOL TARTRATE 5 MG/5ML IV SOLN: 2.5000 mg | INTRAVENOUS | Status: DC | PRN

## 2017-02-17 MED ORDER — LIDOCAINE 2% (20 MG/ML) 5 ML SYRINGE
INTRAMUSCULAR | Status: DC | PRN
  Administered 2017-02-17: 100 mg via INTRAVENOUS

## 2017-02-17 MED ORDER — ARTIFICIAL TEARS OPHTHALMIC OINT
TOPICAL_OINTMENT | OPHTHALMIC | Status: DC | PRN
  Administered 2017-02-17: 1 via OPHTHALMIC

## 2017-02-17 MED ORDER — OXYCODONE HCL 5 MG PO TABS
5.0000 mg | ORAL_TABLET | ORAL | Status: DC | PRN
  Administered 2017-02-19: 10 mg via ORAL
  Administered 2017-02-19 – 2017-02-21 (×5): 5 mg via ORAL
  Administered 2017-02-22: 10 mg via ORAL
  Administered 2017-02-22: 5 mg via ORAL
  Administered 2017-02-22 – 2017-02-24 (×7): 10 mg via ORAL
  Filled 2017-02-17: qty 1
  Filled 2017-02-17 (×2): qty 2
  Filled 2017-02-17 (×2): qty 1
  Filled 2017-02-17 (×2): qty 2
  Filled 2017-02-17: qty 1
  Filled 2017-02-17: qty 2
  Filled 2017-02-17 (×2): qty 1
  Filled 2017-02-17 (×4): qty 2

## 2017-02-17 MED ORDER — ACETAMINOPHEN 160 MG/5ML PO SOLN: 650.0000 mg | Freq: Once | ORAL | Status: AC

## 2017-02-17 MED ORDER — HEPARIN SODIUM (PORCINE) 1000 UNIT/ML IJ SOLN
INTRAMUSCULAR | Status: DC | PRN
  Administered 2017-02-17: 22000 [IU] via INTRAVENOUS

## 2017-02-17 MED ORDER — 0.9 % SODIUM CHLORIDE (POUR BTL) OPTIME
TOPICAL | Status: DC | PRN
  Administered 2017-02-17: 6000 mL

## 2017-02-17 MED ORDER — MIDAZOLAM HCL 5 MG/5ML IJ SOLN
INTRAMUSCULAR | Status: DC | PRN
  Administered 2017-02-17: 1 mg via INTRAVENOUS
  Administered 2017-02-17: 2 mg via INTRAVENOUS
  Administered 2017-02-17 (×2): 1 mg via INTRAVENOUS
  Administered 2017-02-17: 2 mg via INTRAVENOUS
  Administered 2017-02-17 (×3): 1 mg via INTRAVENOUS

## 2017-02-17 MED ORDER — PHENYLEPHRINE 40 MCG/ML (10ML) SYRINGE FOR IV PUSH (FOR BLOOD PRESSURE SUPPORT)
PREFILLED_SYRINGE | INTRAVENOUS | Status: DC | PRN
  Administered 2017-02-17 (×2): 40 ug via INTRAVENOUS
  Administered 2017-02-17: 120 ug via INTRAVENOUS
  Administered 2017-02-17 (×2): 40 ug via INTRAVENOUS

## 2017-02-17 MED ORDER — LACTATED RINGERS IV SOLN: 500.0000 mL | Freq: Once | INTRAVENOUS | Status: DC | PRN

## 2017-02-17 MED FILL — Potassium Chloride Inj 2 mEq/ML: INTRAVENOUS | Qty: 40 | Status: AC

## 2017-02-17 MED FILL — Heparin Sodium (Porcine) Inj 1000 Unit/ML: INTRAMUSCULAR | Qty: 30 | Status: AC

## 2017-02-17 MED FILL — Magnesium Sulfate Inj 50%: INTRAMUSCULAR | Qty: 10 | Status: AC

## 2017-02-17 SURGICAL SUPPLY — 105 items
ADAPTER CARDIO PERF ANTE/RETRO (ADAPTER) ×4 IMPLANT
ADH SRG 12 PREFL SYR 3 SPRDR (MISCELLANEOUS)
ADPR PRFSN 84XANTGRD RTRGD (ADAPTER) ×2
AGENT HMST MTR 8 SURGIFLO (HEMOSTASIS) ×2
BAG DECANTER FOR FLEXI CONT (MISCELLANEOUS) ×4 IMPLANT
BLADE STERNUM SYSTEM 6 (BLADE) ×4 IMPLANT
BLADE SURG 12 STRL SS (BLADE) ×4 IMPLANT
BLADE SURG 15 STRL LF DISP TIS (BLADE) ×2 IMPLANT
BLADE SURG 15 STRL SS (BLADE) ×4
CANISTER SUCT 3000ML PPV (MISCELLANEOUS) ×4 IMPLANT
CANNULA GUNDRY RCSP 15FR (MISCELLANEOUS) ×4 IMPLANT
CANNULA MC2 2 STG 32/40 NON-V (CANNULA) ×1 IMPLANT
CANNULA VENOUS 2 STG 32/40 (CANNULA) ×2
CATH CPB KIT VANTRIGT (MISCELLANEOUS) ×4 IMPLANT
CATH HEART VENT LEFT (CATHETERS) ×2 IMPLANT
CATH RETROPLEGIA CORONARY 14FR (CATHETERS) IMPLANT
CATH ROBINSON RED A/P 18FR (CATHETERS) ×12 IMPLANT
CATH THORACIC 36FR RT ANG (CATHETERS) ×4 IMPLANT
CLIP FOGARTY SPRING 6M (CLIP) IMPLANT
CLIP TI WIDE RED SMALL 24 (CLIP) ×6 IMPLANT
CONT SPEC 4OZ CLIKSEAL STRL BL (MISCELLANEOUS) ×3 IMPLANT
COVER SURGICAL LIGHT HANDLE (MISCELLANEOUS) ×8 IMPLANT
CRADLE DONUT ADULT HEAD (MISCELLANEOUS) ×4 IMPLANT
DRAIN CHANNEL 32F RND 10.7 FF (WOUND CARE) ×4 IMPLANT
DRAPE CARDIOVASCULAR INCISE (DRAPES) ×4
DRAPE SLUSH/WARMER DISC (DRAPES) ×6 IMPLANT
DRAPE SRG 135X102X78XABS (DRAPES) ×2 IMPLANT
DRSG AQUACEL AG ADV 3.5X14 (GAUZE/BANDAGES/DRESSINGS) ×4 IMPLANT
ELECT BLADE 4.0 EZ CLEAN MEGAD (MISCELLANEOUS) ×4
ELECT BLADE 6.5 EXT (BLADE) ×2 IMPLANT
ELECT CAUTERY BLADE 6.4 (BLADE) ×4 IMPLANT
ELECT REM PT RETURN 9FT ADLT (ELECTROSURGICAL) ×8
ELECTRODE BLDE 4.0 EZ CLN MEGD (MISCELLANEOUS) ×2 IMPLANT
ELECTRODE REM PT RTRN 9FT ADLT (ELECTROSURGICAL) ×4 IMPLANT
FELT TEFLON 1X6 (MISCELLANEOUS) ×5 IMPLANT
GAUZE SPONGE 4X4 12PLY STRL (GAUZE/BANDAGES/DRESSINGS) ×8 IMPLANT
GAUZE SPONGE 4X4 12PLY STRL LF (GAUZE/BANDAGES/DRESSINGS) ×2 IMPLANT
GLOVE BIO SURGEON STRL SZ 6 (GLOVE) ×2 IMPLANT
GLOVE BIO SURGEON STRL SZ 6.5 (GLOVE) ×1 IMPLANT
GLOVE BIO SURGEON STRL SZ7.5 (GLOVE) ×10 IMPLANT
GLOVE BIO SURGEONS STRL SZ 6.5 (GLOVE) ×1
GLOVE BIOGEL PI IND STRL 6 (GLOVE) ×5 IMPLANT
GLOVE BIOGEL PI IND STRL 6.5 (GLOVE) ×3 IMPLANT
GLOVE BIOGEL PI INDICATOR 6 (GLOVE) ×10
GLOVE BIOGEL PI INDICATOR 6.5 (GLOVE) ×6
GLOVE SURG SS PI 6.0 STRL IVOR (GLOVE) ×2 IMPLANT
GOWN STRL REUS W/ TWL LRG LVL3 (GOWN DISPOSABLE) ×10 IMPLANT
GOWN STRL REUS W/TWL LRG LVL3 (GOWN DISPOSABLE) ×24
HEMOSTAT POWDER SURGIFOAM 1G (HEMOSTASIS) ×12 IMPLANT
HEMOSTAT SURGICEL 2X14 (HEMOSTASIS) ×4 IMPLANT
INSERT FOGARTY XLG (MISCELLANEOUS) IMPLANT
KIT BASIN OR (CUSTOM PROCEDURE TRAY) ×4 IMPLANT
KIT ROOM TURNOVER OR (KITS) ×4 IMPLANT
KIT SUCTION CATH 14FR (SUCTIONS) ×4 IMPLANT
LEAD PACING MYOCARDI (MISCELLANEOUS) ×4 IMPLANT
LINE VENT (MISCELLANEOUS) ×3 IMPLANT
NS IRRIG 1000ML POUR BTL (IV SOLUTION) ×27 IMPLANT
PACK OPEN HEART (CUSTOM PROCEDURE TRAY) ×4 IMPLANT
PAD ARMBOARD 7.5X6 YLW CONV (MISCELLANEOUS) ×8 IMPLANT
SET CARDIOPLEGIA MPS 5001102 (MISCELLANEOUS) ×3 IMPLANT
SPOGE SURGIFLO 8M (HEMOSTASIS) ×2
SPONGE SURGIFLO 8M (HEMOSTASIS) ×1 IMPLANT
SURGIFLO W/THROMBIN 8M KIT (HEMOSTASIS) ×4 IMPLANT
SUT BONE WAX W31G (SUTURE) ×4 IMPLANT
SUT ETHIBON 2 0 V 52N 30 (SUTURE) ×8 IMPLANT
SUT ETHIBON EXCEL 2-0 V-5 (SUTURE) ×6 IMPLANT
SUT ETHIBOND 2 0 SH (SUTURE) ×16
SUT ETHIBOND 2 0 SH 36X2 (SUTURE) ×2 IMPLANT
SUT ETHIBOND V-5 VALVE (SUTURE) ×6 IMPLANT
SUT PROLENE 3 0 RB 1 (SUTURE) ×4 IMPLANT
SUT PROLENE 3 0 SH 1 (SUTURE) IMPLANT
SUT PROLENE 3 0 SH DA (SUTURE) ×4 IMPLANT
SUT PROLENE 4 0 RB 1 (SUTURE) ×44
SUT PROLENE 4 0 SH DA (SUTURE) ×4 IMPLANT
SUT PROLENE 4-0 RB1 .5 CRCL 36 (SUTURE) ×14 IMPLANT
SUT PROLENE 5 0 C 1 36 (SUTURE) ×9 IMPLANT
SUT PROLENE 6 0 C 1 30 (SUTURE) ×4 IMPLANT
SUT SILK  1 MH (SUTURE) ×6
SUT SILK 1 MH (SUTURE) ×3 IMPLANT
SUT SILK 1 TIES 10X30 (SUTURE) ×2 IMPLANT
SUT SILK 2 0 SH CR/8 (SUTURE) ×4 IMPLANT
SUT SILK 2 0 TIES 10X30 (SUTURE) ×3 IMPLANT
SUT SILK 3 0 SH CR/8 (SUTURE) ×3 IMPLANT
SUT SILK 4 0 TIE 10X30 (SUTURE) ×3 IMPLANT
SUT STEEL 6MS V (SUTURE) ×5 IMPLANT
SUT STEEL SZ 6 DBL 3X14 BALL (SUTURE) ×4 IMPLANT
SUT TEM PAC WIRE 2 0 SH (SUTURE) ×6 IMPLANT
SUT VIC AB 1 CTX 36 (SUTURE) ×8
SUT VIC AB 1 CTX36XBRD ANBCTR (SUTURE) ×4 IMPLANT
SUT VIC AB 2-0 CTX 27 (SUTURE) ×4 IMPLANT
SUT VIC AB 3-0 X1 27 (SUTURE) ×6 IMPLANT
SYR 10ML KIT SKIN ADHESIVE (MISCELLANEOUS) IMPLANT
SYSTEM SAHARA CHEST DRAIN ATS (WOUND CARE) ×4 IMPLANT
TAPE CLOTH SURG 4X10 WHT LF (GAUZE/BANDAGES/DRESSINGS) ×3 IMPLANT
TAPE PAPER 2X10 WHT MICROPORE (GAUZE/BANDAGES/DRESSINGS) ×2 IMPLANT
TOWEL GREEN STERILE (TOWEL DISPOSABLE) ×10 IMPLANT
TOWEL GREEN STERILE FF (TOWEL DISPOSABLE) ×5 IMPLANT
TOWEL OR 17X24 6PK STRL BLUE (TOWEL DISPOSABLE) ×4 IMPLANT
TOWEL OR 17X26 10 PK STRL BLUE (TOWEL DISPOSABLE) ×4 IMPLANT
TRAY FOLEY IC TEMP SENS 14FR (CATHETERS) ×3 IMPLANT
TRAY FOLEY SILVER 16FR TEMP (SET/KITS/TRAYS/PACK) ×2 IMPLANT
UNDERPAD 30X30 (UNDERPADS AND DIAPERS) ×4 IMPLANT
VALVE MAGNA EASE AORTIC 19MM (Prosthesis & Implant Heart) ×3 IMPLANT
VENT LEFT HEART 12002 (CATHETERS) ×4
WATER STERILE IRR 1000ML POUR (IV SOLUTION) ×8 IMPLANT

## 2017-02-17 NOTE — Progress Notes (Signed)
      [address]       [place] [address]             [phone]      S/p AVR  BP (!) 85/48   Pulse 70   Temp (!) 97.5 F (36.4 C)   Resp 15   Ht 4\' 10"  (1.473 m)   Wt 135 lb 12.9 oz (61.6 kg)   SpO2 100%   BMI 28.38 kg/m    Intake/Output Summary (Last 24 hours) at 02/17/17 1754 Last data filed at 02/17/17 1700  Gross per 24 hour  Intake          4515.26 ml  Output             2745 ml  Net          1770.26 ml   Intubated but waking up and weaning has begun  Hct= 26  Doing well early postop  02/19/17 C. Viviann Spare, MD [facility] [phone]

## 2017-02-17 NOTE — Anesthesia Procedure Notes (Signed)
Procedure Name: Intubation Date/Time: 02/17/2017 7:45 AM Performed by: Merrilyn Puma B Pre-anesthesia Checklist: Patient identified, Emergency Drugs available, Suction available, Patient being monitored and Timeout performed Patient Re-evaluated:Patient Re-evaluated prior to induction Oxygen Delivery Method: Circle system utilized Preoxygenation: Pre-oxygenation with 100% oxygen Induction Type: IV induction Ventilation: Mask ventilation without difficulty Laryngoscope Size: Mac and 3 Grade View: Grade I Tube type: Oral Tube size: 7.5 mm Number of attempts: 1 Airway Equipment and Method: Stylet Placement Confirmation: ETT inserted through vocal cords under direct vision,  positive ETCO2,  CO2 detector and breath sounds checked- equal and bilateral Secured at: 20 cm Tube secured with: Tape Dental Injury: Teeth and Oropharynx as per pre-operative assessment

## 2017-02-17 NOTE — Brief Op Note (Signed)
02/17/2017  10:37 AM  PATIENT:  Jenny [name]  71 y.o. female  PRE-OPERATIVE DIAGNOSIS:  SEVERE AS  POST-OPERATIVE DIAGNOSIS:  SEVERE AS  PROCEDURE:  Procedure(s): AORTIC VALVE REPLACEMENT (AVR) (N/A) TRANSESOPHAGEAL ECHOCARDIOGRAM (TEE) (N/A)  SURGEON:  Surgeon(s) and Role:    Kerin Perna, MD - Primary  PHYSICIAN ASSISTANT:  Jari Favre, PA-C   ANESTHESIA:   general  EBL:  Total I/O In: 1000 [I.V.:1000] Out: 725 [Urine:725]  BLOOD ADMINISTERED:none  DRAINS: ROUTINE   LOCAL MEDICATIONS USED:  NONE  SPECIMEN:  Source of Specimen:  AORTIC VALVE LEAFLETS  DISPOSITION OF SPECIMEN:  PATHOLOGY  COUNTS:  YES  TOURNIQUET:  * No tourniquets in log *  DICTATION: .Dragon Dictation  PLAN OF CARE: Admit to inpatient   PATIENT DISPOSITION:  ICU - intubated and hemodynamically stable.   Delay start of Pharmacological VTE agent (>24hrs) due to surgical blood loss or risk of bleeding: yes

## 2017-02-17 NOTE — Progress Notes (Signed)
Patient's PH on ABG was slightly below extubation criteria. RT checked with Dr Dorris Fetch and was told if patient passed parameters that she could be extubated. Patient could not perform VC and had a NIF of -15. Patient seems to be very weak at this time.

## 2017-02-17 NOTE — Progress Notes (Signed)
  Echocardiogram Echocardiogram Transesophageal has been performed.  Janalyn Harder 02/17/2017, 9:19 AM

## 2017-02-17 NOTE — Anesthesia Procedure Notes (Signed)
Central Venous Catheter Insertion Performed by: Marcene Duos, anesthesiologist Start/End8/20/2018 6:35 AM, 02/17/2017 6:45 AM Patient location: Pre-op. Preanesthetic checklist: patient identified, IV checked, site marked, risks and benefits discussed, surgical consent, monitors and equipment checked, pre-op evaluation, timeout performed and anesthesia consent Position: Trendelenburg Lidocaine 1% used for infiltration and patient sedated Hand hygiene performed , maximum sterile barriers used  and Seldinger technique used Catheter size: 9 Fr Total catheter length 10. Central line and PA cath was placed.Sheath introducer Swan type:thermodilution PA Cath depth:50 Procedure performed using ultrasound guided technique. Ultrasound Notes:anatomy identified, needle tip was noted to be adjacent to the nerve/plexus identified, no ultrasound evidence of intravascular and/or intraneural injection and image(s) printed for medical record Attempts: 1 Following insertion, line sutured and dressing applied. Post procedure assessment: blood return through all ports, free fluid flow and no air  Patient tolerated the procedure well with no immediate complications.

## 2017-02-17 NOTE — Anesthesia Procedure Notes (Signed)
Central Venous Catheter Insertion Performed by: Marcene Duos, anesthesiologist Start/End8/20/2018 6:35 AM, 02/17/2017 6:45 AM Patient location: Pre-op. Preanesthetic checklist: patient identified, IV checked, site marked, risks and benefits discussed, surgical consent, monitors and equipment checked, pre-op evaluation, timeout performed and anesthesia consent Hand hygiene performed  and maximum sterile barriers used  PA cath was placed.Swan type:thermodilution Procedure performed using ultrasound guided technique. Ultrasound Notes:anatomy identified, needle tip was noted to be adjacent to the nerve/plexus identified, no ultrasound evidence of intravascular and/or intraneural injection and image(s) printed for medical record Attempts: 1 Patient tolerated the procedure well with no immediate complications.

## 2017-02-17 NOTE — Transfer of Care (Signed)
Immediate Anesthesia Transfer of Care Note  Patient: Jenny [name]  Procedure(s) Performed: Procedure(s): AORTIC VALVE REPLACEMENT (AVR) (N/A) TRANSESOPHAGEAL ECHOCARDIOGRAM (TEE) (N/A)  Patient Location: SICU  Anesthesia Type:MAC  Level of Consciousness: Patient remains intubated per anesthesia plan  Airway & Oxygen Therapy: Patient remains intubated per anesthesia plan and Patient placed on Ventilator (see vital sign flow sheet for setting)  Post-op Assessment: Report given to RN and Post -op Vital signs reviewed and stable  Post vital signs: Reviewed and stable  Last Vitals:  Vitals:   02/17/17 0553  BP: (!) 105/44  Pulse: 70  Resp: 20  Temp: 36.8 C  SpO2: 95%   HR 80 pacing, BP 107/40, sats 100%  Last Pain:  Vitals:   02/17/17 0553  TempSrc: Oral      Patients Stated Pain Goal: 3 (84/66/59 9357)  Complications: No apparent anesthesia complications

## 2017-02-17 NOTE — Procedures (Signed)
Extubation Procedure Note  Patient Details:   Name: HENESSY ROHRER DOB: 06-May-1946 MRN: [id number]   Airway Documentation:     Evaluation  O2 sats: stable throughout Complications: No apparent complications Patient did tolerate procedure well. Bilateral Breath Sounds: Clear, Diminished   Yes   Patient did NIF -20 and FVC 1250. RT extubated patient to 4L [place]. Patient able to verbalize name and did IS 750 ml.   Romona Curls Kinjal Neitzke 02/17/2017, 9:06 PM

## 2017-02-17 NOTE — Progress Notes (Signed)
Pharmacy Antibiotic Note  Jenny [name] is a 71 y.o. female admitted s/p aortic valve replacement. Pharmacy has been consulted for vancomycin dosing (48 hour duration for surgical prophylaxis) -CrCl ~ 50 -vancomycin 1250mg  given at 8am today  Plan: -Vancomycin 1250mg  IV x 1 at 8am on 02/18/17 -Will sign off. Please contact pharmacy with any other needs.   Temp (24hrs), Avg:98.3 F (36.8 C), Min:98.3 F (36.8 C), Max:98.3 F (36.8 C)   Recent Labs Lab 02/13/17 1401  02/17/17 0920 02/17/17 0953 02/17/17 1017 02/17/17 1045 02/17/17 1116 02/17/17 1200  WBC 9.8  --   --   --   --   --   --  10.7*  CREATININE 0.82  < > 0.30* 0.30* 0.30* 0.40* 0.40*  --   < > = values in this interval not displayed.  Estimated Creatinine Clearance: 52.3 mL/min (A) (by C-G formula based on SCr of 0.4 mg/dL (L)).    Allergies  Allergen Reactions  . Iodine Swelling    ANGIOEDEMA FACIAL SWELLING "BETADINE OKAY"  . Shellfish Allergy Anaphylaxis    Thank you for allowing pharmacy to be a part of this patient's care.  02/19/17, Pharm D 02/17/2017 1:07 PM

## 2017-02-17 NOTE — Anesthesia Postprocedure Evaluation (Signed)
Anesthesia Post Note  Patient: JUELLE DICKMANN  Procedure(s) Performed: Procedure(s) (LRB): AORTIC VALVE REPLACEMENT (AVR) (N/A) TRANSESOPHAGEAL ECHOCARDIOGRAM (TEE) (N/A)     Patient location during evaluation: SICU Anesthesia Type: General Level of consciousness: sedated and patient remains intubated per anesthesia plan Pain management: pain level controlled Vital Signs Assessment: post-procedure vital signs reviewed and stable Respiratory status: spontaneous breathing, respiratory function stable and patient remains intubated per anesthesia plan Cardiovascular status: blood pressure returned to baseline and stable Postop Assessment: no signs of nausea or vomiting Anesthetic complications: no Comments: No antiemetics given as patient remained intubated post-operatively.    Last Vitals:  Vitals:   02/17/17 1715 02/17/17 1800  BP:  (!) 97/51  Pulse:    Resp: 15 15  Temp: (!) 36.4 C 36.5 C  SpO2: 100% 100%    Last Pain:  Vitals:   02/17/17 1300  TempSrc: Core (Comment)                 Cecile Hearing

## 2017-02-17 NOTE — Anesthesia Procedure Notes (Signed)
Arterial Line Insertion Start/End8/20/2018 6:45 AM Performed by: Rachel Moulds, CRNA  Patient location: Pre-op. Preanesthetic checklist: patient identified, IV checked, site marked, risks and benefits discussed, surgical consent, monitors and equipment checked, pre-op evaluation and timeout performed Lidocaine 1% used for infiltration and patient sedated Right, radial was placed Catheter size: 20 G Hand hygiene performed  and maximum sterile barriers used  Allen's test indicative of satisfactory collateral circulation Attempts: 1 Procedure performed without using ultrasound guided technique. Following insertion, Biopatch. Post procedure assessment: normal  Patient tolerated the procedure well with no immediate complications.

## 2017-02-17 NOTE — Progress Notes (Signed)
The patient was examined and preop studies reviewed. There has been no change from the prior exam and the patient is ready for surgery. Plan AVR on C [place]

## 2017-02-18 ENCOUNTER — Inpatient Hospital Stay (HOSPITAL_COMMUNITY): Payer: BLUE CROSS/BLUE SHIELD

## 2017-02-18 ENCOUNTER — Encounter (HOSPITAL_COMMUNITY): Payer: Self-pay | Admitting: Cardiothoracic Surgery

## 2017-02-18 LAB — BASIC METABOLIC PANEL
ANION GAP: 5 (ref 5–15)
BUN: 6 mg/dL (ref 6–20)
CALCIUM: 7.7 mg/dL — AB (ref 8.9–10.3)
CO2: 23 mmol/L (ref 22–32)
Chloride: 112 mmol/L — ABNORMAL HIGH (ref 101–111)
Creatinine, Ser: 0.6 mg/dL (ref 0.44–1.00)
GLUCOSE: 98 mg/dL (ref 65–99)
POTASSIUM: 4 mmol/L (ref 3.5–5.1)
SODIUM: 140 mmol/L (ref 135–145)

## 2017-02-18 LAB — CBC
HCT: 24.1 % — ABNORMAL LOW (ref 36.0–46.0)
HEMATOCRIT: 27.7 % — AB (ref 36.0–46.0)
HEMOGLOBIN: 8.8 g/dL — AB (ref 12.0–15.0)
Hemoglobin: 7.5 g/dL — ABNORMAL LOW (ref 12.0–15.0)
MCH: 25 pg — ABNORMAL LOW (ref 26.0–34.0)
MCH: 25.7 pg — ABNORMAL LOW (ref 26.0–34.0)
MCHC: 31.1 g/dL (ref 30.0–36.0)
MCHC: 31.8 g/dL (ref 30.0–36.0)
MCV: 80.3 fL (ref 78.0–100.0)
MCV: 81 fL (ref 78.0–100.0)
PLATELETS: 85 10*3/uL — AB (ref 150–400)
Platelets: 64 10*3/uL — ABNORMAL LOW (ref 150–400)
RBC: 3 MIL/uL — AB (ref 3.87–5.11)
RBC: 3.42 MIL/uL — AB (ref 3.87–5.11)
RDW: 21.4 % — AB (ref 11.5–15.5)
RDW: 20.3 % — AB (ref 11.5–15.5)
WBC: 11.3 10*3/uL — AB (ref 4.0–10.5)
WBC: 9.6 10*3/uL (ref 4.0–10.5)

## 2017-02-18 LAB — GLUCOSE, CAPILLARY
Glucose-Capillary: 100 mg/dL — ABNORMAL HIGH (ref 65–99)
Glucose-Capillary: 103 mg/dL — ABNORMAL HIGH (ref 65–99)
Glucose-Capillary: 104 mg/dL — ABNORMAL HIGH (ref 65–99)
Glucose-Capillary: 106 mg/dL — ABNORMAL HIGH (ref 65–99)
Glucose-Capillary: 106 mg/dL — ABNORMAL HIGH (ref 65–99)
Glucose-Capillary: 109 mg/dL — ABNORMAL HIGH (ref 65–99)
Glucose-Capillary: 111 mg/dL — ABNORMAL HIGH (ref 65–99)
Glucose-Capillary: 112 mg/dL — ABNORMAL HIGH (ref 65–99)
Glucose-Capillary: 116 mg/dL — ABNORMAL HIGH (ref 65–99)
Glucose-Capillary: 116 mg/dL — ABNORMAL HIGH (ref 65–99)
Glucose-Capillary: 117 mg/dL — ABNORMAL HIGH (ref 65–99)
Glucose-Capillary: 133 mg/dL — ABNORMAL HIGH (ref 65–99)
Glucose-Capillary: 156 mg/dL — ABNORMAL HIGH (ref 65–99)
Glucose-Capillary: 183 mg/dL — ABNORMAL HIGH (ref 65–99)
Glucose-Capillary: 197 mg/dL — ABNORMAL HIGH (ref 65–99)
Glucose-Capillary: 85 mg/dL (ref 65–99)
Glucose-Capillary: 95 mg/dL (ref 65–99)
Glucose-Capillary: 98 mg/dL (ref 65–99)

## 2017-02-18 LAB — BPAM PLATELET PHERESIS
Blood Product Expiration Date: 201808202359
ISSUE DATE / TIME: 201808201040
Unit Type and Rh: 6200

## 2017-02-18 LAB — POCT I-STAT 3, ART BLOOD GAS (G3+)
Acid-base deficit: 5 mmol/L — ABNORMAL HIGH (ref 0.0–2.0)
Bicarbonate: 20.3 mmol/L (ref 20.0–28.0)
O2 Saturation: 93 %
Patient temperature: 98.5
TCO2: 21 mmol/L (ref 0–100)
pCO2 arterial: 38.5 mmHg (ref 32.0–48.0)
pH, Arterial: 7.33 — ABNORMAL LOW (ref 7.350–7.450)
pO2, Arterial: 72 mmHg — ABNORMAL LOW (ref 83.0–108.0)

## 2017-02-18 LAB — POCT I-STAT, CHEM 8
BUN: 6 mg/dL (ref 6–20)
Calcium, Ion: 1.19 mmol/L (ref 1.15–1.40)
Chloride: 106 mmol/L (ref 101–111)
Creatinine, Ser: 0.5 mg/dL (ref 0.44–1.00)
Glucose, Bld: 212 mg/dL — ABNORMAL HIGH (ref 65–99)
HCT: 27 % — ABNORMAL LOW (ref 36.0–46.0)
Hemoglobin: 9.2 g/dL — ABNORMAL LOW (ref 12.0–15.0)
Potassium: 3.9 mmol/L (ref 3.5–5.1)
Sodium: 140 mmol/L (ref 135–145)
TCO2: 21 mmol/L (ref 0–100)

## 2017-02-18 LAB — PREPARE PLATELET PHERESIS: Unit division: 0

## 2017-02-18 LAB — CREATININE, SERUM: Creatinine, Ser: 0.65 mg/dL (ref 0.44–1.00)

## 2017-02-18 LAB — POCT I-STAT 4, (NA,K, GLUC, HGB,HCT)
Glucose, Bld: 162 mg/dL — ABNORMAL HIGH (ref 65–99)
HCT: 26 % — ABNORMAL LOW (ref 36.0–46.0)
Hemoglobin: 8.8 g/dL — ABNORMAL LOW (ref 12.0–15.0)
Potassium: 2.9 mmol/L — ABNORMAL LOW (ref 3.5–5.1)
Sodium: 144 mmol/L (ref 135–145)

## 2017-02-18 LAB — MAGNESIUM
MAGNESIUM: 2.4 mg/dL (ref 1.7–2.4)
MAGNESIUM: 2.1 mg/dL (ref 1.7–2.4)

## 2017-02-18 LAB — PREPARE RBC (CROSSMATCH)

## 2017-02-18 MED ORDER — ALPRAZOLAM 0.5 MG PO TABS
0.5000 mg | ORAL_TABLET | Freq: Once | ORAL | Status: AC
  Administered 2017-02-18: 0.5 mg via ORAL
  Filled 2017-02-18: qty 1

## 2017-02-18 MED ORDER — FUROSEMIDE 10 MG/ML IJ SOLN
20.0000 mg | Freq: Two times a day (BID) | INTRAMUSCULAR | Status: DC
  Administered 2017-02-18 – 2017-02-19 (×3): 20 mg via INTRAVENOUS
  Filled 2017-02-18 (×3): qty 2

## 2017-02-18 MED ORDER — ALBUMIN HUMAN 5 % IV SOLN
12.5000 g | Freq: Once | INTRAVENOUS | Status: AC
  Administered 2017-02-18: 12.5 g via INTRAVENOUS
  Filled 2017-02-18: qty 250

## 2017-02-18 MED ORDER — INSULIN ASPART 100 UNIT/ML ~~LOC~~ SOLN
4.0000 [IU] | Freq: Three times a day (TID) | SUBCUTANEOUS | Status: DC
  Administered 2017-02-19 – 2017-02-23 (×9): 4 [IU] via SUBCUTANEOUS

## 2017-02-18 MED ORDER — SODIUM BICARBONATE 8.4 % IV SOLN
25.0000 meq | Freq: Once | INTRAVENOUS | Status: AC
  Administered 2017-02-18: 25 meq via INTRAVENOUS
  Filled 2017-02-18: qty 50

## 2017-02-18 MED ORDER — SODIUM CHLORIDE 0.9 % IV SOLN
0.0000 ug/min | INTRAVENOUS | Status: DC
  Administered 2017-02-18 – 2017-02-19 (×2): 30 ug/min via INTRAVENOUS
  Filled 2017-02-18 (×2): qty 4
  Filled 2017-02-18: qty 40

## 2017-02-18 MED ORDER — CALCIUM CHLORIDE 10 % IV SOLN
INTRAVENOUS | Status: AC
  Administered 2017-02-18: 1000 mg
  Filled 2017-02-18: qty 10

## 2017-02-18 MED ORDER — INSULIN ASPART 100 UNIT/ML ~~LOC~~ SOLN
0.0000 [IU] | SUBCUTANEOUS | Status: DC
  Administered 2017-02-18: 2 [IU] via SUBCUTANEOUS
  Administered 2017-02-18 (×2): 4 [IU] via SUBCUTANEOUS
  Administered 2017-02-19 (×2): 2 [IU] via SUBCUTANEOUS

## 2017-02-18 MED ORDER — INSULIN GLARGINE 100 UNIT/ML ~~LOC~~ SOLN
10.0000 [IU] | Freq: Two times a day (BID) | SUBCUTANEOUS | Status: DC
  Administered 2017-02-18 – 2017-02-24 (×11): 10 [IU] via SUBCUTANEOUS
  Filled 2017-02-18 (×13): qty 0.1

## 2017-02-18 MED FILL — Mannitol IV Soln 20%: INTRAVENOUS | Qty: 500 | Status: AC

## 2017-02-18 MED FILL — Electrolyte-R (PH 7.4) Solution: INTRAVENOUS | Qty: 3000 | Status: AC

## 2017-02-18 MED FILL — Sodium Chloride IV Soln 0.9%: INTRAVENOUS | Qty: 2000 | Status: AC

## 2017-02-18 MED FILL — Sodium Bicarbonate IV Soln 8.4%: INTRAVENOUS | Qty: 50 | Status: AC

## 2017-02-18 MED FILL — Lidocaine HCl IV Inj 20 MG/ML: INTRAVENOUS | Qty: 5 | Status: AC

## 2017-02-18 NOTE — Progress Notes (Signed)
CTS PM Rounds  [facility] in hall A-pacing Low u/o - appears dry Legs warm Pm labs ok- creat 0.6

## 2017-02-18 NOTE — Progress Notes (Signed)
Procedure(s) (LRB): AORTIC VALVE REPLACEMENT (AVR) (N/A) TRANSESOPHAGEAL ECHOCARDIOGRAM (TEE) (N/A) Subjective: Stable night A-pacing Expected postop blood loss anemia Objective: Vital signs in last 24 hours: Temp:  [95.5 F (35.3 C)-100.4 F (38 C)] 99.1 F (37.3 C) (08/21 0700) Pulse Rate:  [72-76] 73 (08/21 0400) Cardiac Rhythm: Normal sinus rhythm (08/21 0400) Resp:  [10-26] 14 (08/21 0700) BP: (77-131)/(43-73) 101/51 (08/21 0700) SpO2:  [97 %-100 %] 98 % (08/21 0700) Arterial Line BP: (95-164)/(31-65) 126/42 (08/21 0700) FiO2 (%):  [40 %-50 %] 40 % (08/20 2008) Weight:  [135 lb 12.9 oz (61.6 kg)-140 lb 12.8 oz (63.9 kg)] 140 lb 12.8 oz (63.9 kg) (08/21 0451)  Hemodynamic parameters for last 24 hours: PAP: (17-40)/(8-24) 28/14 CO:  [3 L/min-4.5 L/min] 3.8 L/min CI:  [1.9 L/min/m2-2.9 L/min/m2] 2.5 L/min/m2  Intake/Output from previous day: 08/20 0701 - 08/21 0700 In: 5923.1 [I.V.:3756.1; Blood:767; IV Piggyback:1400] Out: 3655 [Urine:2005; Blood:1200; Chest Tube:450] Intake/Output this shift: No intake/output data recorded.       Exam    General- alert and comfortable   Lungs- clear without rales, wheezes   Cor- regular rate and rhythm, no murmur , gallop   Abdomen- soft, non-tender   Extremities - warm, non-tender, minimal edema   Neuro- oriented, appropriate, no focal weakness   Lab Results:  Recent Labs  02/17/17 1750  02/17/17 1826 02/18/17 0350  WBC 9.6  --   --  11.3*  HGB 5.5*  < > 8.6* 7.5*  HCT 17.5*  < > 27.2* 24.1*  PLT 76*  --   --  85*  < > = values in this interval not displayed. BMET:  Recent Labs  02/17/17 1759 02/18/17 0350  NA 142 140  K 4.3 4.0  CL 109 112*  CO2  --  23  GLUCOSE 114* 98  BUN 4* 6  CREATININE 0.40* 0.60  CALCIUM  --  7.7*    PT/INR:  Recent Labs  02/17/17 1200  LABPROT 22.8*  INR 1.98   ABG    Component Value Date/Time   PHART 7.336 (L) 02/17/2017 2253   HCO3 23.2 02/17/2017 2253   TCO2 24  02/17/2017 2253   ACIDBASEDEF 2.0 02/17/2017 2253   O2SAT 97.0 02/17/2017 2253   CBG (last 3)   Recent Labs  02/18/17 0355 02/18/17 0500 02/18/17 0553  GLUCAP 98 104* 106*    Assessment/Plan: S/P Procedure(s) (LRB): AORTIC VALVE REPLACEMENT (AVR) (N/A) TRANSESOPHAGEAL ECHOCARDIOGRAM (TEE) (N/Aone unit packed cells for Hb < 7.5 on hi dose neo- expected postop blood loss anemia Progression orders Lasix after transfusion  LOS: 1 day    Kathlee Nations Trigt III 02/18/2017

## 2017-02-18 NOTE — Progress Notes (Signed)
02/18/2017 1600 During hourly rounding, resting in bed, very anxious, moderate tremors noted in upper extremities. Pt. States she feels like she is having a panic attack and states she gets them at home periodically. VSS. BP 111/57, HR 86 O2 sat 100% on 2L, CBG 196. Pt. States at home she takes 0.5 mg Xanax prn for panic attacks. Dr. Donata Clay paged and made aware. Verbal order received for 0.5 mg PO Xanax x 1 and check ABG with 1700 labs. Orders placed and enacted. Emotional support provided to patient. Will continue to closely monitor patient.  Jenny [name], Blanchard Kelch

## 2017-02-18 NOTE — Op Note (Signed)
NAME:  Jenny [name], Jenny [name]                   ACCOUNT NO.:  MEDICAL RECORD NO.:  [id number]  LOCATION:                                 FACILITY:  PHYSICIAN:  Kerin Perna, M.D.       DATE OF BIRTH:  DATE OF PROCEDURE:  02/17/2017 DATE OF DISCHARGE:                              OPERATIVE REPORT   OPERATION:  Aortic valve replacement using a 19 mm bioprosthetic Magna Ease valve, serial [id number].  PREOPERATIVE DIAGNOSIS:  Severe aortic stenosis, recent admission for heart failure.  POSTOPERATIVE DIAGNOSIS:  Severe aortic stenosis, recent admission for heart failure.  SURGEON:  Kerin Perna, M.D.  ASSISTANT:  Jari Favre, PA-C.  ANESTHESIA:  General by Dr. Viviann Spare.  CLINICAL NOTE:  The patient is a 71 year old female with a history of cardiac murmur, recently admitted to [facility] with acute heart failure.  Echocardiogram showed severe aortic stenosis.  She had LVH.  Cardiac catheterization demonstrated no significant coronary artery disease with mild-moderately elevated right-sided pressures.  She was felt to be a candidate for aortic valve replacement.  I saw the patient in consultation in the office after reviewing her cardiac cath and echocardiogram and images.  I discussed the role of aortic valve replacement for treatment of her severe aortic stenosis, including the potential benefits of improved survival, improved quality of life, and to reduce the progression of heart failure.  I discussed the risks of death, bleeding, blood transfusion, heart block, infection, pulmonary problems including pleural effusion, stroke, and death.  She demonstrated her understanding of these issues and agreed to proceed with surgery under what I felt was an informed consent.  FINDINGS: 1. Bicuspid aortic valve. 2. Aortic diameter was not dilated, but was thin. 3. No packed cell transfusions required for the surgery. 4. Postoperative thrombocytopenia and mild coagulopathy  treated with 1     unit of platelet transfusion.  DESCRIPTION OF PROCEDURE:  The patient was brought to the operating room, placed supine on the operating room table.  General anesthesia was induced under invasive hemodynamic monitoring.  The chest, abdomen, and legs were prepped with Betadine and draped as a sterile field.  A transesophageal echo probe was placed by the Anesthesia Team.  A proper time-out was performed.  A sternal incision was made.  The sternal retractor was placed.  The pericardium was opened and suspended.  Pursestrings were placed in ascending aorta and right atrium.  Heparin was administered and when the ACT was documented as being therapeutic, the patient was cannulated and placed on cardiopulmonary bypass.  Cardioplegia cannulas were placed both antegrade and retrograde cold blood cardioplegia.  An LV vent was placed via the right superior pulmonary vein.  The patient was cooled to 32 degrees.  The aortic crossclamp was applied.  One liter of cold blood cardioplegia was delivered in split doses between the antegrade aortic and retrograde coronary sinus catheters.  The aorta was opened with a transverse aortotomy.  The aortic valve was inspected.  It was bicuspid.  It was heavily calcified and thickened. It was excised and the annulus debrided of calcium.  The outflow tract was irrigated with copious amounts of  cold saline.  The annulus was sized to a 19 mm Event organiser.  I felt that this would be adequate due to the patient's low body surface area of 1.5 m2 and her height of 4 feet 10 inches.  I felt that a 19 mm valve would provide excellent hemodynamics for her body size.  Subannular 2-0 pledgeted Ethibond sutures were placed around the annulus.  The valve was prepared according to protocol.  The sutures were placed with the sewing ring and the valve was seated and sutures were tied.  The valve conformed the annulus nicely.  There was no obstruction of  the coronary ostia.  The aortotomy was closed in layers using running 4-0 Prolene.  Prior to releasing the crossclamp, air was removed from the coronary arteries in the left side of the heart with a dose of retrograde warm blood cardioplegia.  The heart resumed a spontaneous rhythm.  The aortotomy was checked and was hemostatic.  Temporary pacing wires were applied.  The patient was rewarmed and reperfused.  The lungs were expanded.  The ventilator was resumed.  The patient was weaned off cardiopulmonary bypass on low-dose Neo.  LV function was good.  She weaned off cardiopulmonary bypass without difficulty.  She required AV sequential pacing.  She had 1 episode of sinus tachycardia demonstrating integrity of the AV bundle.  The patient was given protamine without adverse reaction.  Her platelet count was low less than 90,000.  She was given 1 unit of platelets.  The superior pericardial fat was closed over the aorta.  Anterior mediastinal chest tube was placed.  The sternum was closed with wire. The pectoralis fascia was closed with a running #1 Vicryl.  The subcutaneous and skin layers were closed in running Vicryl and sterile dressings were applied.  Total cardiopulmonary bypass time was 100 minutes.     Kerin Perna, M.D.     PV/MEDQ  D:  02/17/2017  T:  02/17/2017  Job:  [id number]  cc:   Lorine Bears, MD

## 2017-02-18 NOTE — Care Management Note (Addendum)
Case Management Note  Patient Details  Name: Jenny [name] MRN: [id number] Date of Birth: 1945/12/17  Subjective/Objective:   From home with spouse, POD 1 AVR, conts onm iv abx, insulin drip, neo drip , and Iv lasix.  Await pt eval. She has a rolling walker at home that she used when she broke her foot, but has not had to use it since.                 Action/Plan: NCM will follow for dc needs.   Expected Discharge Date:                  Expected Discharge Plan:     In-House Referral:     Discharge planning Services  CM Consult  Post Acute Care Choice:    Choice offered to:     DME Arranged:    DME Agency:     HH Arranged:    HH Agency:     Status of Service:  In process, will continue to follow  If discussed at Long Length of Stay Meetings, dates discussed:    Additional Comments:  Leone Haven, RN 02/18/2017, 3:32 PM

## 2017-02-19 ENCOUNTER — Inpatient Hospital Stay (HOSPITAL_COMMUNITY): Payer: BLUE CROSS/BLUE SHIELD

## 2017-02-19 LAB — GLUCOSE, CAPILLARY
Glucose-Capillary: 134 mg/dL — ABNORMAL HIGH (ref 65–99)
Glucose-Capillary: 142 mg/dL — ABNORMAL HIGH (ref 65–99)
Glucose-Capillary: 143 mg/dL — ABNORMAL HIGH (ref 65–99)
Glucose-Capillary: 161 mg/dL — ABNORMAL HIGH (ref 65–99)
Glucose-Capillary: 193 mg/dL — ABNORMAL HIGH (ref 65–99)
Glucose-Capillary: 194 mg/dL — ABNORMAL HIGH (ref 65–99)

## 2017-02-19 LAB — CBC
HCT: 27.5 % — ABNORMAL LOW (ref 36.0–46.0)
Hemoglobin: 8 g/dL — ABNORMAL LOW (ref 12.0–15.0)
MCH: 23.5 pg — ABNORMAL LOW (ref 26.0–34.0)
MCHC: 29.1 g/dL — ABNORMAL LOW (ref 30.0–36.0)
MCV: 80.6 fL (ref 78.0–100.0)
Platelets: 69 10*3/uL — ABNORMAL LOW (ref 150–400)
RBC: 3.41 MIL/uL — ABNORMAL LOW (ref 3.87–5.11)
RDW: 20.5 % — ABNORMAL HIGH (ref 11.5–15.5)
WBC: 8.2 10*3/uL (ref 4.0–10.5)

## 2017-02-19 LAB — COOXEMETRY PANEL
Carboxyhemoglobin: 1.4 % (ref 0.5–1.5)
Methemoglobin: 0.9 % (ref 0.0–1.5)
O2 Saturation: 74.8 %
Total hemoglobin: 12.5 g/dL (ref 12.0–16.0)

## 2017-02-19 LAB — POCT I-STAT, CHEM 8
BUN: 7 mg/dL (ref 6–20)
Calcium, Ion: 1.23 mmol/L (ref 1.15–1.40)
Chloride: 99 mmol/L — ABNORMAL LOW (ref 101–111)
Creatinine, Ser: 0.5 mg/dL (ref 0.44–1.00)
Glucose, Bld: 228 mg/dL — ABNORMAL HIGH (ref 65–99)
HCT: 25 % — ABNORMAL LOW (ref 36.0–46.0)
Hemoglobin: 8.5 g/dL — ABNORMAL LOW (ref 12.0–15.0)
Potassium: 3.7 mmol/L (ref 3.5–5.1)
Sodium: 136 mmol/L (ref 135–145)
TCO2: 25 mmol/L (ref 0–100)

## 2017-02-19 LAB — BASIC METABOLIC PANEL
Anion gap: 5 (ref 5–15)
BUN: 5 mg/dL — ABNORMAL LOW (ref 6–20)
CO2: 25 mmol/L (ref 22–32)
Calcium: 8.5 mg/dL — ABNORMAL LOW (ref 8.9–10.3)
Chloride: 108 mmol/L (ref 101–111)
Creatinine, Ser: 0.6 mg/dL (ref 0.44–1.00)
GFR calc Af Amer: >60 mL/min (ref >60)
GFR calc non Af Amer: >60 mL/min (ref >60)
Glucose, Bld: 150 mg/dL — ABNORMAL HIGH (ref 65–99)
Potassium: 3.5 mmol/L (ref 3.5–5.1)
Sodium: 138 mmol/L (ref 135–145)

## 2017-02-19 MED ORDER — SODIUM CHLORIDE 0.9% FLUSH: 10.0000 mL | INTRAVENOUS | Status: DC | PRN

## 2017-02-19 MED ORDER — INSULIN ASPART 100 UNIT/ML ~~LOC~~ SOLN: 0.0000 [IU] | Freq: Three times a day (TID) | SUBCUTANEOUS | Status: DC

## 2017-02-19 MED ORDER — POTASSIUM CHLORIDE 10 MEQ/50ML IV SOLN
10.0000 meq | INTRAVENOUS | Status: AC
  Administered 2017-02-19 (×3): 10 meq via INTRAVENOUS
  Filled 2017-02-19 (×3): qty 50

## 2017-02-19 MED ORDER — INSULIN ASPART 100 UNIT/ML ~~LOC~~ SOLN
0.0000 [IU] | SUBCUTANEOUS | Status: DC
  Administered 2017-02-19 (×2): 4 [IU] via SUBCUTANEOUS
  Administered 2017-02-19: 2 [IU] via SUBCUTANEOUS
  Administered 2017-02-19: 4 [IU] via SUBCUTANEOUS
  Administered 2017-02-20 (×2): 2 [IU] via SUBCUTANEOUS

## 2017-02-19 MED ORDER — FUROSEMIDE 10 MG/ML IJ SOLN
20.0000 mg | Freq: Four times a day (QID) | INTRAMUSCULAR | Status: DC
  Administered 2017-02-19 – 2017-02-20 (×3): 20 mg via INTRAVENOUS
  Filled 2017-02-19 (×4): qty 2

## 2017-02-19 MED ORDER — ASPIRIN EC 81 MG PO TBEC
81.0000 mg | DELAYED_RELEASE_TABLET | Freq: Every day | ORAL | Status: DC
  Administered 2017-02-20 – 2017-02-24 (×5): 81 mg via ORAL
  Filled 2017-02-19 (×5): qty 1

## 2017-02-19 MED ORDER — SODIUM CHLORIDE 0.9% FLUSH
10.0000 mL | Freq: Two times a day (BID) | INTRAVENOUS | Status: DC
  Administered 2017-02-20: 10 mL
  Administered 2017-02-20: 20 mL
  Administered 2017-02-21: 10 mL

## 2017-02-19 MED ORDER — CHLORHEXIDINE GLUCONATE CLOTH 2 % EX PADS
6.0000 | MEDICATED_PAD | Freq: Every day | CUTANEOUS | Status: DC
  Administered 2017-02-19 – 2017-02-20 (×2): 6 via TOPICAL

## 2017-02-19 NOTE — Progress Notes (Signed)
2 Days Post-Op Procedure(s) (LRB): AORTIC VALVE REPLACEMENT (AVR) (N/A) TRANSESOPHAGEAL ECHOCARDIOGRAM (TEE) (N/A) Subjective: Patient progressing slowly, still requires Neo-Synephrine to maintain blood pressure Urine output slowly improving but weight still increased and chest x-ray wet Will increase Lasix to every 6 hours Needs motivation for ambulation out of bed Remove his central line today after Neo-Synephrine weaned off Leave Foley today to monitor urine output  Objective: Vital signs in last 24 hours: Temp:  [98 F (36.7 C)-99.5 F (37.5 C)] 98 F (36.7 C) (08/22 0700) Pulse Rate:  [86] 86 (08/22 0000) Cardiac Rhythm: Atrial paced (08/22 0600) Resp:  [13-37] 22 (08/22 1000) BP: (98-146)/(43-99) 100/79 (08/22 1000) SpO2:  [87 %-100 %] 96 % (08/22 1000) Arterial Line BP: (132-158)/(40-52) 154/51 (08/21 1715) Weight:  [145 lb 15.1 oz (66.2 kg)] 145 lb 15.1 oz (66.2 kg) (08/22 0500)  Hemodynamic parameters for last 24 hours:   Atrially paced Intake/Output from previous day: 08/21 0701 - 08/22 0700 In: 2711 [P.O.:780; I.V.:901; Blood:330; IV Piggyback:700] Out: 1585 [Urine:1545; Chest Tube:40] Intake/Output this shift: Total I/O In: 353.9 [P.O.:240; I.V.:63.9; IV Piggyback:50] Out: 295 [Urine:295]       Exam    General- alert and comfortable   Lungs- clear without rales, wheezes   Cor- regular rate and rhythm, no murmur , gallop   Abdomen- soft, non-tender   Extremities - warm, non-tender, minimal edema   Neuro- oriented, appropriate, no focal weakness   Lab Results:  Recent Labs  02/18/17 1630 02/19/17 0345  WBC 9.6 8.2  HGB 8.8* 8.0*  HCT 27.7* 27.5*  PLT 64* 69*   BMET:  Recent Labs  02/18/17 0350 02/18/17 1627 02/18/17 1630 02/19/17 0345  NA 140 140  --  138  K 4.0 3.9  --  3.5  CL 112* 106  --  108  CO2 23  --   --  25  GLUCOSE 98 212*  --  150*  BUN 6 6  --  5*  CREATININE 0.60 0.50 0.65 0.60  CALCIUM 7.7*  --   --  8.5*    PT/INR:   Recent Labs  02/17/17 1200  LABPROT 22.8*  INR 1.98   ABG    Component Value Date/Time   PHART 7.330 (L) 02/18/2017 1630   HCO3 20.3 02/18/2017 1630   TCO2 21 02/18/2017 1630   ACIDBASEDEF 5.0 (H) 02/18/2017 1630   O2SAT 74.8 02/19/2017 0417   CBG (last 3)   Recent Labs  02/18/17 2326 02/19/17 0332 02/19/17 0736  GLUCAP 156* 142* 143*    Assessment/Plan: S/P Procedure(s) (LRB): AORTIC VALVE REPLACEMENT (AVR) (N/A) TRANSESOPHAGEAL ECHOCARDIOGRAM (TEE) (N/A) Mobilize Reduce aspirin to 81 mg because of thrombocytopenia   LOS: 2 days    Jenny [name] 02/19/2017

## 2017-02-19 NOTE — Progress Notes (Signed)
TCTS BRIEF SICU PROGRESS NOTE  2 Days Post-Op  S/P Procedure(s) (LRB): AORTIC VALVE REPLACEMENT (AVR) (N/A) TRANSESOPHAGEAL ECHOCARDIOGRAM (TEE) (N/A)   Stable day Paced rhythm w/ marginal BP, still on Neo drip Breathing comfortably w/ O2 sats 97% on 1 L/min UOP adequate  Plan: Continue current plan  Purcell Nails, MD 02/19/2017 7:54 PM

## 2017-02-19 NOTE — Progress Notes (Signed)
eLink Physician-Brief Progress Note Patient Name: Jenny [name] DOB: Mar 19, 1946 MRN: [id number]   Date of Service  02/19/2017  HPI/Events of Note  Aortic valve replacement using a 19 mm bioprosthetic ease valve  PREOPERATIVE DIAGNOSIS:  Severe aortic stenosis, recent admission for heart failure. Resting comfortably  eICU Interventions  No new orders     Intervention Category Evaluation Type: New Patient Evaluation  Erin Fulling 02/19/2017, 6:41 PM

## 2017-02-20 ENCOUNTER — Inpatient Hospital Stay (HOSPITAL_COMMUNITY): Payer: BLUE CROSS/BLUE SHIELD

## 2017-02-20 LAB — CBC
HCT: 27 % — ABNORMAL LOW (ref 36.0–46.0)
Hemoglobin: 8.2 g/dL — ABNORMAL LOW (ref 12.0–15.0)
MCH: 24.8 pg — ABNORMAL LOW (ref 26.0–34.0)
MCHC: 30.4 g/dL (ref 30.0–36.0)
MCV: 81.8 fL (ref 78.0–100.0)
Platelets: 88 10*3/uL — ABNORMAL LOW (ref 150–400)
RBC: 3.3 MIL/uL — ABNORMAL LOW (ref 3.87–5.11)
RDW: 21 % — ABNORMAL HIGH (ref 11.5–15.5)
WBC: 8.6 10*3/uL (ref 4.0–10.5)

## 2017-02-20 LAB — BASIC METABOLIC PANEL
Anion gap: 7 (ref 5–15)
BUN: 8 mg/dL (ref 6–20)
CO2: 24 mmol/L (ref 22–32)
Calcium: 8.1 mg/dL — ABNORMAL LOW (ref 8.9–10.3)
Chloride: 104 mmol/L (ref 101–111)
Creatinine, Ser: 0.52 mg/dL (ref 0.44–1.00)
GFR calc Af Amer: >60 mL/min (ref >60)
GFR calc non Af Amer: >60 mL/min (ref >60)
Glucose, Bld: 91 mg/dL (ref 65–99)
Potassium: 3.6 mmol/L (ref 3.5–5.1)
Sodium: 135 mmol/L (ref 135–145)

## 2017-02-20 LAB — GLUCOSE, CAPILLARY
Glucose-Capillary: 140 mg/dL — ABNORMAL HIGH (ref 65–99)
Glucose-Capillary: 159 mg/dL — ABNORMAL HIGH (ref 65–99)
Glucose-Capillary: 84 mg/dL (ref 65–99)
Glucose-Capillary: 85 mg/dL (ref 65–99)
Glucose-Capillary: 90 mg/dL (ref 65–99)
Glucose-Capillary: 92 mg/dL (ref 65–99)

## 2017-02-20 LAB — COOXEMETRY PANEL
Carboxyhemoglobin: 2 % — ABNORMAL HIGH (ref 0.5–1.5)
Methemoglobin: 0.7 % (ref 0.0–1.5)
O2 Saturation: 72.9 %
Total hemoglobin: 8.4 g/dL — ABNORMAL LOW (ref 12.0–16.0)

## 2017-02-20 MED ORDER — POTASSIUM CHLORIDE CRYS ER 20 MEQ PO TBCR
20.0000 meq | EXTENDED_RELEASE_TABLET | Freq: Two times a day (BID) | ORAL | Status: DC
  Administered 2017-02-20 – 2017-02-24 (×9): 20 meq via ORAL
  Filled 2017-02-20 (×9): qty 1

## 2017-02-20 MED ORDER — FUROSEMIDE 10 MG/ML IJ SOLN
20.0000 mg | Freq: Two times a day (BID) | INTRAMUSCULAR | Status: DC
  Administered 2017-02-20: 20 mg via INTRAVENOUS
  Filled 2017-02-20: qty 2

## 2017-02-20 NOTE — Progress Notes (Signed)
3 Days Post-Op Procedure(s) (LRB): AORTIC VALVE REPLACEMENT (AVR) (N/A) TRANSESOPHAGEAL ECHOCARDIOGRAM (TEE) (N/A) Subjective: Weaning neo diuresisng well nsr Needs PT  Objective: Vital signs in last 24 hours: Temp:  [97.5 F (36.4 C)-98.4 F (36.9 C)] 98.2 F (36.8 C) (08/23 0755) Cardiac Rhythm: Atrial paced (08/23 0400) Resp:  [14-33] 14 (08/23 0755) BP: (81-118)/(42-79) 116/51 (08/23 0700) SpO2:  [91 %-100 %] 99 % (08/23 0755) Weight:  [148 lb 2.4 oz (67.2 kg)] 148 lb 2.4 oz (67.2 kg) (08/23 0500)  Hemodynamic parameters for last 24 hours:  stable  Intake/Output from previous day: 08/22 0701 - 08/23 0700 In: 1288.7 [P.O.:720; I.V.:418.7; IV Piggyback:150] Out: 1365 [Urine:1365] Intake/Output this shift: Total I/O In: -  Out: 400 [Urine:400]       Exam    General- alert and comfortable   Lungs- clear without rales, wheezes   Cor- regular rate and rhythm, no murmur , gallop   Abdomen- soft, non-tender   Extremities - warm, non-tender, minimal edema   Neuro- oriented, appropriate, no focal weakness   Lab Results:  Recent Labs  02/19/17 0345 02/19/17 1657 02/20/17 0421  WBC 8.2  --  8.6  HGB 8.0* 8.5* 8.2*  HCT 27.5* 25.0* 27.0*  PLT 69*  --  88*   BMET:  Recent Labs  02/19/17 0345 02/19/17 1657 02/20/17 0421  NA 138 136 135  K 3.5 3.7 3.6  CL 108 99* 104  CO2 25  --  24  GLUCOSE 150* 228* 91  BUN 5* 7 8  CREATININE 0.60 0.50 0.52  CALCIUM 8.5*  --  8.1*    PT/INR:  Recent Labs  02/17/17 1200  LABPROT 22.8*  INR 1.98   ABG    Component Value Date/Time   PHART 7.330 (L) 02/18/2017 1630   HCO3 20.3 02/18/2017 1630   TCO2 25 02/19/2017 1657   ACIDBASEDEF 5.0 (H) 02/18/2017 1630   O2SAT 72.9 02/20/2017 0455   CBG (last 3)   Recent Labs  02/19/17 2039 02/19/17 2340 02/20/17 0341  GLUCAP 161* 134* 92    Assessment/Plan: S/P Procedure(s) (LRB): AORTIC VALVE REPLACEMENT (AVR) (N/A) TRANSESOPHAGEAL ECHOCARDIOGRAM (TEE)  (N/A) Diuresis wean neo for SBP > 100   LOS: 3 days    Jenny [name] 02/20/2017

## 2017-02-20 NOTE — Progress Notes (Signed)
PT Cancellation Note  Patient Details Name: Jenny [name] MRN: [id number] DOB: 1946-05-21   Cancelled Treatment:    Reason Eval/Treat Not Completed: Medical issues which prohibited therapy (Line pulled.  30 min bedrest per nurse. Will check back as able.)   Berline Lopes 02/20/2017, 12:40 PM  [facility] [phone] (pager)

## 2017-02-20 NOTE — Progress Notes (Signed)
Pt refused povidone-iodine "betadine" swab to chest after dressing removed.

## 2017-02-20 NOTE — Progress Notes (Signed)
Patient ambulated 295 ft with wheelchair. Standby assist. Tolerated well. Room air. No complaints of pain. Encouraging ambulation and IS use. Patient up to 500 on IS x 15. Will continue to monitor.  Delories Heinz, RN

## 2017-02-20 NOTE — Progress Notes (Signed)
CT Surg  Patient examined and record reviewed.Hemodynamics stable,labs satisfactory.Patient had stable day.Continue current care. Neo off OOB to chair Kathlee Nations Trigt III 02/20/2017

## 2017-02-21 ENCOUNTER — Inpatient Hospital Stay (HOSPITAL_COMMUNITY): Payer: BLUE CROSS/BLUE SHIELD

## 2017-02-21 LAB — GLUCOSE, CAPILLARY
Glucose-Capillary: 89 mg/dL (ref 65–99)
Glucose-Capillary: 96 mg/dL (ref 65–99)
Glucose-Capillary: 113 mg/dL — ABNORMAL HIGH (ref 65–99)
Glucose-Capillary: 135 mg/dL — ABNORMAL HIGH (ref 65–99)
Glucose-Capillary: 253 mg/dL — ABNORMAL HIGH (ref 65–99)

## 2017-02-21 LAB — BPAM RBC
BLOOD PRODUCT EXPIRATION DATE: 201809132359
BLOOD PRODUCT EXPIRATION DATE: 201809132359
BLOOD PRODUCT EXPIRATION DATE: 201809142359
Blood Product Expiration Date: 201809132359
ISSUE DATE / TIME: 201808200843
ISSUE DATE / TIME: 201808211046
UNIT TYPE AND RH: 7300
UNIT TYPE AND RH: 7300
Unit Type and Rh: 7300
Unit Type and Rh: 7300

## 2017-02-21 LAB — TYPE AND SCREEN
ABO/RH(D): B POS
ANTIBODY SCREEN: NEGATIVE
UNIT DIVISION: 0
Unit division: 0
Unit division: 0
Unit division: 0

## 2017-02-21 LAB — BASIC METABOLIC PANEL
Anion gap: 10 (ref 5–15)
BUN: 7 mg/dL (ref 6–20)
CO2: 24 mmol/L (ref 22–32)
Calcium: 8.7 mg/dL — ABNORMAL LOW (ref 8.9–10.3)
Chloride: 104 mmol/L (ref 101–111)
Creatinine, Ser: 0.57 mg/dL (ref 0.44–1.00)
GFR calc Af Amer: >60 mL/min (ref >60)
GFR calc non Af Amer: >60 mL/min (ref >60)
Glucose, Bld: 82 mg/dL (ref 65–99)
Potassium: 4 mmol/L (ref 3.5–5.1)
Sodium: 138 mmol/L (ref 135–145)

## 2017-02-21 LAB — CBC
HCT: 27.1 % — ABNORMAL LOW (ref 36.0–46.0)
Hemoglobin: 8.6 g/dL — ABNORMAL LOW (ref 12.0–15.0)
MCH: 25.7 pg — ABNORMAL LOW (ref 26.0–34.0)
MCHC: 31.7 g/dL (ref 30.0–36.0)
MCV: 81.1 fL (ref 78.0–100.0)
Platelets: 73 10*3/uL — ABNORMAL LOW (ref 150–400)
RBC: 3.34 MIL/uL — ABNORMAL LOW (ref 3.87–5.11)
RDW: 21.1 % — ABNORMAL HIGH (ref 11.5–15.5)
WBC: 3.9 10*3/uL — ABNORMAL LOW (ref 4.0–10.5)

## 2017-02-21 MED ORDER — SODIUM CHLORIDE 0.9% FLUSH
3.0000 mL | Freq: Two times a day (BID) | INTRAVENOUS | Status: DC
  Administered 2017-02-21: 3 mL via INTRAVENOUS

## 2017-02-21 MED ORDER — SODIUM CHLORIDE 0.9 % IV SOLN: 250.0000 mL | INTRAVENOUS | Status: DC | PRN

## 2017-02-21 MED ORDER — SODIUM CHLORIDE 0.9% FLUSH: 3.0000 mL | INTRAVENOUS | Status: DC | PRN

## 2017-02-21 MED ORDER — INSULIN ASPART 100 UNIT/ML ~~LOC~~ SOLN
0.0000 [IU] | Freq: Three times a day (TID) | SUBCUTANEOUS | Status: DC
  Administered 2017-02-21: 2 [IU] via SUBCUTANEOUS
  Administered 2017-02-21: 12 [IU] via SUBCUTANEOUS
  Administered 2017-02-22 (×2): 8 [IU] via SUBCUTANEOUS
  Administered 2017-02-23: 4 [IU] via SUBCUTANEOUS
  Administered 2017-02-24: 2 [IU] via SUBCUTANEOUS

## 2017-02-21 MED ORDER — MOVING RIGHT ALONG BOOK
Freq: Once | Status: AC
  Administered 2017-02-21: 1
  Filled 2017-02-21: qty 1

## 2017-02-21 MED ORDER — FUROSEMIDE 10 MG/ML IJ SOLN
40.0000 mg | Freq: Every day | INTRAMUSCULAR | Status: DC
  Administered 2017-02-21 – 2017-02-22 (×2): 40 mg via INTRAVENOUS
  Filled 2017-02-21 (×2): qty 4

## 2017-02-21 NOTE — Evaluation (Signed)
Physical Therapy Evaluation Patient Details Name: Jenny [name] MRN: [id number] DOB: [birth year] Today's Date: 02/21/2017   History of Present Illness  Pt admitted for AVR.    Past Medical History:  Diagnosis Date  . Anxiety   . Arthritis   . Chronic systolic CHF (congestive heart failure) (HCC)    a. TTE 12/2016, EF 45-50%, probable hypokinesis of the mid apical anteroseptal, anterior, & apical myocardium, GR2DD, possibly bicuspid aortic valve that was moderately thickened w/ severely calcified leaflets, severe aortic stenosis w/ mean gradient 47 mmHg, valve area 0.52 cm, mild MR, mildly dilated LA  . Depression   . Diabetes mellitus with complication (HCC)    Tylenol  . Heart murmur   . Hypothyroidism   . IBS (irritable bowel syndrome)   . Iron deficiency anemia    a. s/p pRBC x1 in 12/2016  . Panic attacks   . Severe aortic stenosis    a. TTE 12/2016: possibly bicuspid aortic valve that was moderately thickened with severely calcified leaflets. There was severe aortic stenosis with a mean gradient of 47 mmHg and valve area of 0.52 cm  . Stroke (HCC) 2001  . TIA (transient ischemic attack) 2006    Clinical Impression  Pt admitted with above diagnosis. Pt currently with functional limitations due to the deficits listed below (see PT Problem List). Pt was able to ambulate with RW on unit with min assist and cues.  Pt unsteady occasionally.  Supportive family.  Should progress well.  VSS.  Will follow acutely.  Pt will benefit from skilled PT to increase their independence and safety with mobility to allow discharge to the venue listed below.      Follow Up Recommendations Home health PT;Supervision/Assistance - 24 hour    Equipment Recommendations  Other (comment) (tub bench)    Recommendations for Other Services       Precautions / Restrictions Precautions Precautions: Fall;Sternal Restrictions Weight Bearing Restrictions: No      Mobility  Bed Mobility Overal bed  mobility: Needs Assistance Bed Mobility: Rolling;Sidelying to Sit Rolling: Min assist Sidelying to sit: Min assist       General bed mobility comments: min assist for sternal precautions  Transfers Overall transfer level: Needs assistance Equipment used: Rolling walker (2 wheeled) Transfers: Sit to/from Stand Sit to Stand: Min assist;Min guard         General transfer comment: Slight assist to power up.  Pt was on 3N1 on arrival andwas able to clean herself.   Ambulation/Gait Ambulation/Gait assistance: Min assist Ambulation Distance (Feet): 190 Feet Assistive device: Rolling walker (2 wheeled) Gait Pattern/deviations: Step-through pattern;Decreased stride length;Drifts right/left;Trunk flexed;Wide base of support   Gait velocity interpretation: Below normal speed for age/gender General Gait Details: Pt with slightly flexed posture.  Needed cues to steer RW and to stay close to RW.  Pt walks slowly but is safe overall with occasional cues.    Stairs            Wheelchair Mobility    Modified Rankin (Stroke Patients Only)       Balance Overall balance assessment: Needs assistance Sitting-balance support: No upper extremity supported;Feet supported Sitting balance-Leahy Scale: Fair     Standing balance support: Bilateral upper extremity supported;During functional activity Standing balance-Leahy Scale: Poor Standing balance comment: relies on RW for support                             Pertinent Vitals/Pain  Pain Assessment: Faces Faces Pain Scale: Hurts even more Pain Location: pain under left breast Pain Descriptors / Indicators: Aching Pain Intervention(s): Limited activity within patient's tolerance;Monitored during session;Repositioned (nurse notified and applied dressing to tape burn)    Home Living Family/patient expects to be discharged to:: Private residence Living Arrangements: Spouse/significant other Available Help at Discharge:  Family;Available 24 hours/day Type of Home: House Home Access: Stairs to enter Entrance Stairs-Rails: Right;Left;Can reach both Entrance Stairs-Number of Steps: 6 Home Layout: Two level;Able to live on main level with bedroom/bathroom (Stays on main floor) Home Equipment: Walker - 2 wheels;Cane - single point;Grab bars - tub/shower      Prior Function Level of Independence: Independent               Hand Dominance        Extremity/Trunk Assessment   Upper Extremity Assessment Upper Extremity Assessment: Defer to OT evaluation    Lower Extremity Assessment Lower Extremity Assessment: Generalized weakness    Cervical / Trunk Assessment Cervical / Trunk Assessment: Kyphotic  Communication   Communication: No difficulties  Cognition Arousal/Alertness: Awake/alert Behavior During Therapy: WFL for tasks assessed/performed Overall Cognitive Status: Within Functional Limits for tasks assessed                                        General Comments General comments (skin integrity, edema, etc.): Nurse placed dressing under left breast where pt had a small tape burn.    Exercises     Assessment/Plan    PT Assessment Patient needs continued PT services  PT Problem List Decreased activity tolerance;Decreased balance;Decreased mobility;Decreased knowledge of use of DME;Decreased safety awareness;Decreased knowledge of precautions;Cardiopulmonary status limiting activity       PT Treatment Interventions DME instruction;Gait training;Stair training;Functional mobility training;Therapeutic activities;Therapeutic exercise;Balance training;Patient/family education    PT Goals (Current goals can be found in the Care Plan section)  Acute Rehab PT Goals Patient Stated Goal: to go home PT Goal Formulation: With patient Time For Goal Achievement: 03/07/17 Potential to Achieve Goals: Good    Frequency Min 3X/week   Barriers to discharge         Co-evaluation               AM-PAC PT "6 Clicks" Daily Activity  Outcome Measure Difficulty turning over in bed (including adjusting bedclothes, sheets and blankets)?: Unable Difficulty moving from lying on back to sitting on the side of the bed? : Unable Difficulty sitting down on and standing up from a chair with arms (e.g., wheelchair, bedside commode, etc,.)?: A Little Help needed moving to and from a bed to chair (including a wheelchair)?: A Little Help needed walking in hospital room?: A Little Help needed climbing 3-5 steps with a railing? : Total 6 Click Score: 12    End of Session Equipment Utilized During Treatment: Gait belt Activity Tolerance: Patient limited by fatigue Patient left: in bed;with call bell/phone within reach Nurse Communication: Mobility status PT Visit Diagnosis: Unsteadiness on feet (R26.81);Muscle weakness (generalized) (M62.81)    Time: 0350-0938 PT Time Calculation (min) (ACUTE ONLY): 24 min   Charges:   PT Evaluation $PT Eval Moderate Complexity: 1 Mod PT Treatments $Gait Training: 8-22 mins   PT G Codes:        Brinley Rosete,PT Acute Rehabilitation [phone] (pager)   Berline Lopes 02/21/2017, 4:04 PM

## 2017-02-21 NOTE — Progress Notes (Signed)
4 Days Post-Op Procedure(s) (LRB): AORTIC VALVE REPLACEMENT (AVR) (N/A) TRANSESOPHAGEAL ECHOCARDIOGRAM (TEE) (N/A) Subjective: Progressing CXR wet Lasix increased nsr will tx to stepdown Objective: Vital signs in last 24 hours: Temp:  [97.4 F (36.3 C)-98.9 F (37.2 C)] 98.8 F (37.1 C) (08/24 0729) Pulse Rate:  [85] 85 (08/24 0729) Cardiac Rhythm: Normal sinus rhythm (08/23 2000) Resp:  [12-28] 19 (08/24 0729) BP: (72-126)/(17-81) 125/49 (08/24 0729) SpO2:  [86 %-100 %] 95 % (08/24 0729) Weight:  [146 lb 9.7 oz (66.5 kg)] 146 lb 9.7 oz (66.5 kg) (08/24 0600)  Hemodynamic parameters for last 24 hours:  stable  Intake/Output from previous day: 08/23 0701 - 08/24 0700 In: 1085.8 [P.O.:830; I.V.:255.8] Out: 1575 [Urine:1575] Intake/Output this shift: No intake/output data recorded.       Exam    General- alert and comfortable   Lungs- clear without rales, wheezes   Cor- regular rate and rhythm, no murmur , gallop   Abdomen- soft, non-tender   Extremities - warm, non-tender, minimal edema   Neuro- oriented, appropriate, no focal weakness   Lab Results:  Recent Labs  02/20/17 0421 02/21/17 0318  WBC 8.6 3.9*  HGB 8.2* 8.6*  HCT 27.0* 27.1*  PLT 88* 73*   BMET:  Recent Labs  02/20/17 0421 02/21/17 0318  NA 135 138  K 3.6 4.0  CL 104 104  CO2 24 24  GLUCOSE 91 82  BUN 8 7  CREATININE 0.52 0.57  CALCIUM 8.1* 8.7*    PT/INR: No results for input(s): LABPROT, INR in the last 72 hours. ABG    Component Value Date/Time   PHART 7.330 (L) 02/18/2017 1630   HCO3 20.3 02/18/2017 1630   TCO2 25 02/19/2017 1657   ACIDBASEDEF 5.0 (H) 02/18/2017 1630   O2SAT 72.9 02/20/2017 0455   CBG (last 3)   Recent Labs  02/20/17 2333 02/21/17 0417 02/21/17 0732  GLUCAP 90 89 96    Assessment/Plan: S/P Procedure(s) (LRB): AORTIC VALVE REPLACEMENT (AVR) (N/A) TRANSESOPHAGEAL ECHOCARDIOGRAM (TEE) (N/A) Mobilize Diuresis Diabetes control Plan for transfer to  step-down: see transfer orders Follow plts  [73k]  LOS: 4 days    Jenny [name] 02/21/2017

## 2017-02-22 ENCOUNTER — Inpatient Hospital Stay (HOSPITAL_COMMUNITY): Payer: BLUE CROSS/BLUE SHIELD

## 2017-02-22 LAB — GLUCOSE, CAPILLARY
Glucose-Capillary: 88 mg/dL (ref 65–99)
Glucose-Capillary: 234 mg/dL — ABNORMAL HIGH (ref 65–99)
Glucose-Capillary: 78 mg/dL (ref 65–99)
Glucose-Capillary: 238 mg/dL — ABNORMAL HIGH (ref 65–99)

## 2017-02-22 LAB — BASIC METABOLIC PANEL
Anion gap: 7 (ref 5–15)
BUN: 8 mg/dL (ref 6–20)
CO2: 23 mmol/L (ref 22–32)
Calcium: 8.4 mg/dL — ABNORMAL LOW (ref 8.9–10.3)
Chloride: 106 mmol/L (ref 101–111)
Creatinine, Ser: 0.73 mg/dL (ref 0.44–1.00)
GFR calc Af Amer: >60 mL/min (ref >60)
GFR calc non Af Amer: >60 mL/min (ref >60)
Glucose, Bld: 89 mg/dL (ref 65–99)
Potassium: 4.3 mmol/L (ref 3.5–5.1)
Sodium: 136 mmol/L (ref 135–145)

## 2017-02-22 LAB — CBC
HCT: 26.6 % — ABNORMAL LOW (ref 36.0–46.0)
Hemoglobin: 8.4 g/dL — ABNORMAL LOW (ref 12.0–15.0)
MCH: 25.6 pg — ABNORMAL LOW (ref 26.0–34.0)
MCHC: 31.6 g/dL (ref 30.0–36.0)
MCV: 81.1 fL (ref 78.0–100.0)
Platelets: 96 10*3/uL — ABNORMAL LOW (ref 150–400)
RBC: 3.28 MIL/uL — ABNORMAL LOW (ref 3.87–5.11)
RDW: 21.4 % — ABNORMAL HIGH (ref 11.5–15.5)
WBC: 4.1 10*3/uL (ref 4.0–10.5)

## 2017-02-22 MED ORDER — FUROSEMIDE 10 MG/ML IJ SOLN
40.0000 mg | Freq: Two times a day (BID) | INTRAMUSCULAR | Status: DC
  Administered 2017-02-22 – 2017-02-23 (×2): 40 mg via INTRAVENOUS
  Filled 2017-02-22 (×2): qty 4

## 2017-02-22 NOTE — Progress Notes (Addendum)
[address]       [place] [address]             [phone]      5 Days Post-Op Procedure(s) (LRB): AORTIC VALVE REPLACEMENT (AVR) (N/A) TRANSESOPHAGEAL ECHOCARDIOGRAM (TEE) (N/A) Subjective: Feels pretty well, some incisional discomfort  Objective: Vital signs in last 24 hours: Temp:  [97.8 F (36.6 C)-98.5 F (36.9 C)] 98.4 F (36.9 C) (08/25 0912) Pulse Rate:  [82-97] 91 (08/25 0912) Cardiac Rhythm: Normal sinus rhythm (08/25 0701) Resp:  [13-32] 18 (08/25 0912) BP: (90-126)/(39-94) 102/41 (08/25 0912) SpO2:  [90 %-97 %] 94 % (08/25 0912)  Hemodynamic parameters for last 24 hours:    Intake/Output from previous day: 08/24 0701 - 08/25 0700 In: 840 [P.O.:840] Out: 1100 [Urine:1100] Intake/Output this shift: Total I/O In: 240 [P.O.:240] Out: -   General appearance: alert, cooperative and no distress Heart: regular rate and rhythm and soft systolic murmur Lungs: mildly dim in bases Abdomen: benign Extremities: no edema Wound: incis healing well  Lab Results:  Recent Labs  02/21/17 0318 02/22/17 0247  WBC 3.9* 4.1  HGB 8.6* 8.4*  HCT 27.1* 26.6*  PLT 73* 96*   BMET:  Recent Labs  02/21/17 0318 02/22/17 0247  NA 138 136  K 4.0 4.3  CL 104 106  CO2 24 23  GLUCOSE 82 89  BUN 7 8  CREATININE 0.57 0.73  CALCIUM 8.7* 8.4*    PT/INR: No results for input(s): LABPROT, INR in the last 72 hours. ABG    Component Value Date/Time   PHART 7.330 (L) 02/18/2017 1630   HCO3 20.3 02/18/2017 1630   TCO2 25 02/19/2017 1657   ACIDBASEDEF 5.0 (H) 02/18/2017 1630   O2SAT 72.9 02/20/2017 0455   CBG (last 3)   Recent Labs  02/21/17 1603 02/21/17 2220 02/22/17 0608  GLUCAP 135* 253* 88    Meds Scheduled Meds: . acetaminophen  1,000 mg Oral Q6H  . ALPRAZolam  0.5 mg Oral Daily  . ALPRAZolam  1 mg Oral QHS  . aspirin EC  81 mg Oral Daily  . bisacodyl  10 mg Oral Daily   Or  . bisacodyl  10 mg Rectal Daily  . chlorhexidine  gluconate (MEDLINE KIT)  15 mL Mouth Rinse BID  . docusate sodium  200 mg Oral Daily  . furosemide  40 mg Intravenous Daily  . insulin aspart  0-24 Units Subcutaneous TID AC & HS  . insulin aspart  4 Units Subcutaneous TID WC  . insulin glargine  10 Units Subcutaneous BID  . levothyroxine  88 mcg Oral QAC breakfast  . mouth rinse  15 mL Mouth Rinse QID  . metoCLOPramide (REGLAN) injection  10 mg Intravenous Q6H  . pantoprazole  40 mg Oral Daily  . potassium chloride  20 mEq Oral BID  . rosuvastatin  10 mg Oral q1800  . sertraline  100 mg Oral BID  . sodium chloride flush  10-40 mL Intracatheter Q12H   Continuous Infusions: . lactated ringers 10 mL/hr at 02/21/17 0600  . lactated ringers    . phenylephrine (NEO-SYNEPHRINE) Adult infusion Stopped (02/20/17 1110)   PRN Meds:.ondansetron (ZOFRAN) IV, oxyCODONE, sodium chloride flush, traMADol  Xrays Dg Chest Port 1 View  Result Date: 02/21/2017 CLINICAL DATA:  Status post AVR, wheezing EXAM: PORTABLE CHEST 1 VIEW COMPARISON:  02/20/2017 FINDINGS: Cardiomegaly with mild to moderate interstitial edema. Moderate right and small left pleural effusions. Prosthetic aortic valve.  Median sternotomy.  Interval removal of right IJ venous sheath. IMPRESSION: Cardiomegaly with mild to moderate interstitial edema. Moderate right and small left pleural effusions. Electronically Signed   By: Julian Hy M.D.   On: 02/21/2017 02:54    Assessment/Plan: S/P Procedure(s) (LRB): AORTIC VALVE REPLACEMENT (AVR) (N/A) TRANSESOPHAGEAL ECHOCARDIOGRAM (TEE) (N/A)  1 conts with good overall progress 2 hemodyn stable in sinus rhythm 3 labs are stable 4 sugars are variable- monitor, meds may need adjustment as appetite improves 5 cont diuretics, CHF with effusions on CXR- will change to BID, monitor ceat closely 6 push rehab as able   LOS: 5 days    GOLD,WAYNE E 02/22/2017   I have seen and examined the patient and agree with the assessment and  plan as outlined.  Rexene Alberts, MD 02/22/2017 11:09 AM

## 2017-02-22 NOTE — Progress Notes (Signed)
Blood sugar was 88. Patient is asymptomatic and she states that she feels fine but wanted some orange juice. Gave two containers. Will continue to monitor.

## 2017-02-22 NOTE — Progress Notes (Addendum)
   CARDIAC REHAB PHASE I   PRE:  Rate/Rhythm: 89 SR  BP:   Sitting: 100/43     SaO2: 94% RA  MODE:  Ambulation: 140 ft   POST:  Rate/Rhythm: 103 ST  BP:   Sitting: 125/52     SaO2: 94% RA   1341-1453 Pt walked 118ft with one person A, gait belt and rolling walker. Pt maintained a somewhat steady gait  and slow pace Returned pt to recliner with LE and VSS.  Discussed with patient sternal precautions and wound care. Expressed functional mobility I.e sit to stands, and bed mobility without overuse of UE (sternal precautions). Had pt demonstrated sit to stands x3.Reviewed lifting and activity restrictions, HH diet and diabetes nutrition, gave pt ho for leisurely reading. Exercise guidelines were given. Pt became tearful in discussion of family members. Discussed coping mechanisms. Education will need to be reinforced. Practice use of IS x 5, pt reached 500 ML, encouraged 10x/1 hour. Discussed CR in [place], gave pt brochure. Will f/u.   Jenny Manor D Margot Oriordan,MS,ACSM-RCEP 02/22/2017 2:50 PM

## 2017-02-23 ENCOUNTER — Inpatient Hospital Stay (HOSPITAL_COMMUNITY): Payer: BLUE CROSS/BLUE SHIELD

## 2017-02-23 LAB — BASIC METABOLIC PANEL
Anion gap: 10 (ref 5–15)
BUN: 5 mg/dL — AB (ref 6–20)
CO2: 24 mmol/L (ref 22–32)
Calcium: 8.9 mg/dL (ref 8.9–10.3)
Chloride: 104 mmol/L (ref 101–111)
Creatinine, Ser: 0.58 mg/dL (ref 0.44–1.00)
GFR calc Af Amer: >60 mL/min (ref >60)
GLUCOSE: 109 mg/dL — AB (ref 65–99)
POTASSIUM: 4 mmol/L (ref 3.5–5.1)
Sodium: 138 mmol/L (ref 135–145)

## 2017-02-23 LAB — GLUCOSE, CAPILLARY
Glucose-Capillary: 117 mg/dL — ABNORMAL HIGH (ref 65–99)
Glucose-Capillary: 181 mg/dL — ABNORMAL HIGH (ref 65–99)
Glucose-Capillary: 105 mg/dL — ABNORMAL HIGH (ref 65–99)
Glucose-Capillary: 82 mg/dL (ref 65–99)

## 2017-02-23 MED ORDER — METFORMIN HCL 500 MG PO TABS
500.0000 mg | ORAL_TABLET | Freq: Two times a day (BID) | ORAL | Status: DC
  Administered 2017-02-23 – 2017-02-24 (×3): 500 mg via ORAL
  Filled 2017-02-23 (×3): qty 1

## 2017-02-23 MED ORDER — FUROSEMIDE 40 MG PO TABS
40.0000 mg | ORAL_TABLET | Freq: Every day | ORAL | Status: DC
  Administered 2017-02-24: 40 mg via ORAL
  Filled 2017-02-23 (×2): qty 1

## 2017-02-23 NOTE — Progress Notes (Signed)
02/23/2017 1:31 PM EPW D/C'd per order and per protocol.  Ends intact. Pt. Tolerated well.  Advised bedrest x1hr.  Call bell in reach.  Vital signs collected per protocol. Kathryne Hitch

## 2017-02-23 NOTE — Progress Notes (Addendum)
[address]       [place] [address]             [phone]      6 Days Post-Op Procedure(s) (LRB): AORTIC VALVE REPLACEMENT (AVR) (N/A) TRANSESOPHAGEAL ECHOCARDIOGRAM (TEE) (N/A) Subjective: Slowly feeling better, + coughing up some sputum, feels pretty fatigued  Objective: Vital signs in last 24 hours: Temp:  [98.6 F (37 C)] 98.6 F (37 C) (08/26 0405) Pulse Rate:  [88-98] 98 (08/26 0405) Cardiac Rhythm: Normal sinus rhythm (08/25 1905) Resp:  [16-18] 18 (08/26 0405) BP: (106-130)/(42-47) 130/47 (08/26 0405) SpO2:  [91 %-95 %] 91 % (08/26 0405) Weight:  [143 lb 8 oz (65.1 kg)] 143 lb 8 oz (65.1 kg) (08/26 0405)  Hemodynamic parameters for last 24 hours:    Intake/Output from previous day: 08/25 0701 - 08/26 0700 In: 1110 [P.O.:1110] Out: -  Intake/Output this shift: No intake/output data recorded.  General appearance: alert, cooperative and no distress Heart: regular rate and rhythm and 2/6 syst murmur Lungs: dim in bases Abdomen: benign Extremities: min edema Wound: incis healing well  Lab Results:  Recent Labs  02/21/17 0318 02/22/17 0247  WBC 3.9* 4.1  HGB 8.6* 8.4*  HCT 27.1* 26.6*  PLT 73* 96*   BMET:  Recent Labs  02/22/17 0247 02/23/17 0423  NA 136 138  K 4.3 4.0  CL 106 104  CO2 23 24  GLUCOSE 89 109*  BUN 8 5*  CREATININE 0.73 0.58  CALCIUM 8.4* 8.9    PT/INR: No results for input(s): LABPROT, INR in the last 72 hours. ABG    Component Value Date/Time   PHART 7.330 (L) 02/18/2017 1630   HCO3 20.3 02/18/2017 1630   TCO2 25 02/19/2017 1657   ACIDBASEDEF 5.0 (H) 02/18/2017 1630   O2SAT 72.9 02/20/2017 0455   CBG (last 3)   Recent Labs  02/22/17 1653 02/22/17 2026 02/23/17 0609  GLUCAP 78 238* 117*    Meds Scheduled Meds: . ALPRAZolam  0.5 mg Oral Daily  . ALPRAZolam  1 mg Oral QHS  . aspirin EC  81 mg Oral Daily  . bisacodyl  10 mg Oral Daily   Or  . bisacodyl  10 mg Rectal Daily  .  chlorhexidine gluconate (MEDLINE KIT)  15 mL Mouth Rinse BID  . docusate sodium  200 mg Oral Daily  . furosemide  40 mg Intravenous BID  . insulin aspart  0-24 Units Subcutaneous TID AC & HS  . insulin aspart  4 Units Subcutaneous TID WC  . insulin glargine  10 Units Subcutaneous BID  . levothyroxine  88 mcg Oral QAC breakfast  . mouth rinse  15 mL Mouth Rinse QID  . metoCLOPramide (REGLAN) injection  10 mg Intravenous Q6H  . pantoprazole  40 mg Oral Daily  . potassium chloride  20 mEq Oral BID  . rosuvastatin  10 mg Oral q1800  . sertraline  100 mg Oral BID  . sodium chloride flush  10-40 mL Intracatheter Q12H   Continuous Infusions: . lactated ringers 10 mL/hr at 02/21/17 0600  . lactated ringers    . phenylephrine (NEO-SYNEPHRINE) Adult infusion Stopped (02/20/17 1110)   PRN Meds:.ondansetron (ZOFRAN) IV, oxyCODONE, sodium chloride flush, traMADol  Xrays Dg Chest 2 View  Result Date: 02/23/2017 CLINICAL DATA:  Postop from aortic valve replacement. EXAM: CHEST  2 VIEW COMPARISON:  02/22/2017 FINDINGS: Stable mild cardiomegaly. Stable diffuse interstitial infiltrates, consistent with mild interstitial edema. Stable bibasilar atelectasis  and small bilateral pleural effusions. IMPRESSION: No significant change and mild interstitial edema, small bilateral pleural effusions, and bibasilar atelectasis. Electronically Signed   By: Earle Gell M.D.   On: 02/23/2017 07:32   Dg Chest 2 View  Result Date: 02/22/2017 CLINICAL DATA:  Status post aortic valve replacement EXAM: CHEST  2 VIEW COMPARISON:  02/21/2017 FINDINGS: Cardiac shadow is mildly prominent. Postsurgical changes are again seen. Aortic calcifications are noted. Bilateral pleural effusions are seen. There is underlying atelectasis/infiltrate present. IMPRESSION: Bibasilar changes stable from the prior exam. No new focal abnormality is seen. Electronically Signed   By: Inez Catalina M.D.   On: 02/22/2017 14:30     Assessment/Plan: S/P Procedure(s) (LRB): AORTIC VALVE REPLACEMENT (AVR) (N/A) TRANSESOPHAGEAL ECHOCARDIOGRAM (TEE) (N/A)  1 doing better with slow /steady improvement. In sinus rhythm 2 CXR is stable with intersticial edema and effusions/atx 3 UO not recorded yesterday- cont current diuretics- creat is improved 4 glucose contro is still quite variable, transition to home meds over time although would be hesitant to restart [place] as CHF is a risk - add metformin for now 5 push pulm toilet and rehab as able  LOS: 6 days    GOLD,WAYNE E 02/23/2017   I have seen and examined the patient and agree with the assessment and plan as outlined.  Upset because she has experienced considerable delay getting help when she calls for assistance.  Rexene Alberts, MD 02/23/2017 10:11 AM

## 2017-02-24 LAB — GLUCOSE, CAPILLARY
Glucose-Capillary: 101 mg/dL — ABNORMAL HIGH (ref 65–99)
Glucose-Capillary: 147 mg/dL — ABNORMAL HIGH (ref 65–99)

## 2017-02-24 MED ORDER — ASPIRIN 81 MG PO TBEC: 81.0000 mg | DELAYED_RELEASE_TABLET | Freq: Every day | ORAL | Status: DC

## 2017-02-24 MED ORDER — FOLIC ACID 1 MG PO TABS: 1.0000 mg | ORAL_TABLET | Freq: Every day | ORAL | Status: DC

## 2017-02-24 MED ORDER — POTASSIUM CHLORIDE CRYS ER 10 MEQ PO TBCR: 10.0000 meq | EXTENDED_RELEASE_TABLET | Freq: Every day | ORAL | 0 refills | Status: DC

## 2017-02-24 MED ORDER — OXYCODONE HCL 5 MG PO TABS: ORAL_TABLET | ORAL | 0 refills | Status: DC

## 2017-02-24 MED ORDER — FUROSEMIDE 40 MG PO TABS: 40.0000 mg | ORAL_TABLET | Freq: Every day | ORAL | Status: DC

## 2017-02-24 MED ORDER — FUROSEMIDE 10 MG/ML IJ SOLN: 40.0000 mg | Freq: Once | INTRAMUSCULAR | Status: DC

## 2017-02-24 MED ORDER — FUROSEMIDE 40 MG PO TABS: 20.0000 mg | ORAL_TABLET | Freq: Every day | ORAL | 2 refills | Status: DC

## 2017-02-24 MED ORDER — FERROUS SULFATE 325 (65 FE) MG PO TABS: 325.0000 mg | ORAL_TABLET | Freq: Every day | ORAL | Status: DC

## 2017-02-24 NOTE — Progress Notes (Signed)
Pt refuses to walk tonight.  Will try to ambulate in the AM.  Harriet Masson, RN

## 2017-02-24 NOTE — Discharge Instructions (Signed)
Aortic Valve Replacement, Care After °Refer to this sheet in the next few weeks. These instructions provide you with information about caring for yourself after your procedure. Your health care provider may also give you more specific instructions. Your treatment has been planned according to current medical practices, but problems sometimes occur. Call your health care provider if you have any problems or questions after your procedure. °What can I expect after the procedure? °After the procedure, it is common to have: °· Pain around your incision area. °· A small amount of blood or clear fluid coming from your incision. ° °Follow these instructions at home: °Eating and drinking ° °· Follow instructions from your health care provider about eating or drinking restrictions. °? Limit alcohol intake to no more than 1 drink per day for nonpregnant women and 2 drinks per day for men. One drink equals 12 oz of beer, 5 oz of wine, or 1½ oz of hard liquor. °? Limit how much caffeine you drink. Caffeine can affect your heart's rate and rhythm. °· Drink enough fluid to keep your urine clear or pale yellow. °· Eat a heart-healthy diet. This should include plenty of fresh fruits and vegetables. If you eat meat, it should be lean cuts. Avoid foods that are: °? High in salt, saturated fat, or sugar. °? Canned or highly processed. °? Fried. °Activity °· Return to your normal activities as told by your health care provider. Ask your health care provider what activities are safe for you. °· Exercise regularly once you have recovered, as told by your health care provider. °· Avoid sitting for more than 2 hours at a time without moving. Get up and move around at least once every 1-2 hours. This helps to prevent blood clots in the legs. °· Do not lift anything that is heavier than 10 lb (4.5 kg) until your health care provider approves. °· Avoid pushing or pulling things with your arms until your health care provider approves. This  includes pulling on handrails to help you climb stairs. °Incision care ° °· Follow instructions from your health care provider about how to take care of your incision. Make sure you: °? Wash your hands with soap and water before you change your bandage (dressing). If soap and water are not available, use hand sanitizer. °? Change your dressing as told by your health care provider. °? Leave stitches (sutures), skin glue, or adhesive strips in place. These skin closures may need to stay in place for 2 weeks or longer. If adhesive strip edges start to loosen and curl up, you may trim the loose edges. Do not remove adhesive strips completely unless your health care provider tells you to do that. °· Check your incision area every day for signs of infection. Check for: °? More redness, swelling, or pain. °? More fluid or blood. °? Warmth. °? Pus or a bad smell. °Medicines °· Take over-the-counter and prescription medicines only as told by your health care provider. °· If you were prescribed an antibiotic medicine, take it as told by your health care provider. Do not stop taking the antibiotic even if you start to feel better. °Travel °· Avoid airplane travel for as long as told by your health care provider. °· When you travel, bring a list of your medicines and a record of your medical history with you. Carry your medicines with you. °Driving °· Ask your health care provider when it is safe for you to drive. Do not drive until your health   care provider approves. °· Do not drive or operate heavy machinery while taking prescription pain medicine. °Lifestyle ° °· Do not use any tobacco products, such as cigarettes, chewing tobacco, or e-cigarettes. If you need help quitting, ask your health care provider. °· Resume sexual activity as told by your health care provider. Do not use medicines for erectile dysfunction unless your health care provider approves, if this applies. °· Work with your health care provider to keep your  blood pressure and cholesterol under control, and to manage any other heart conditions that you have. °· Maintain a healthy weight. °General instructions °· Do not take baths, swim, or use a hot tub until your health care provider approves. °· Do not strain to have a bowel movement. °· Avoid crossing your legs while sitting down. °· Check your temperature every day for a fever. A fever may be a sign of infection. °· If you are a woman and you plan to become pregnant, talk with your health care provider before you become pregnant. °· Wear compression stockings if your health care provider instructs you to do this. These stockings help to prevent blood clots and reduce swelling in your legs. °· Tell all health care providers who care for you that you have an artificial (prosthetic) aortic valve. If you have or have had heart disease or endocarditis, tell all health care providers about these conditions as well. °· Keep all follow-up visits as told by your health care provider. This is important. °Contact a health care provider if: °· You develop a skin rash. °· You experience sudden, unexplained changes in your weight. °· You have more redness, swelling, or pain around your incision. °· You have more fluid or blood coming from your incision. °· Your incision feels warm to the touch. °· You have pus or a bad smell coming from your incision. °· You have a fever. °Get help right away if: °· You develop chest pain that is different from the pain coming from your incision. °· You develop shortness of breath or difficulty breathing. °· You start to feel light-headed. °These symptoms may represent a serious problem that is an emergency. Do not wait to see if the symptoms will go away. Get medical help right away. Call your local emergency services (911 in the [place] Do not drive yourself to the hospital. °This information is not intended to replace advice given to you by your health care provider. Make sure you discuss any  questions you have with your health care provider. °Document Released: 01/03/2005 Document Revised: 11/23/2015 Document Reviewed: 05/21/2015 °Elsevier Interactive Patient Education © 2017 [employer] ° °

## 2017-02-24 NOTE — Progress Notes (Signed)
Pt discharged home with son and husband. IV and telemetry box removed, per order. Chest tube sutures removed, per order. Benzoin and steri-strips applied. Sites clean and dry. Pt received discharge instructions and all questions were answered. Pt received paper prescriptions for oxy and potassium and was instructed to get them filled at her pharmacy. Pt verbalized understanding. Pt discharged via wheelchair and was accompanied by a Ferne Coe and pt's son.  Berdine Dance BSN, RN

## 2017-02-24 NOTE — Progress Notes (Addendum)
      [address]       [place] [address]             [phone]        7 Days Post-Op Procedure(s) (LRB): AORTIC VALVE REPLACEMENT (AVR) (N/A) TRANSESOPHAGEAL ECHOCARDIOGRAM (TEE) (N/A)  Subjective: Patient states Dr. Donata Clay is sending me home. She is very excited and has no complaints.  Objective: Vital signs in last 24 hours: Temp:  [98.3 F (36.8 C)-99.1 F (37.3 C)] 99.1 F (37.3 C) (08/27 0504) Pulse Rate:  [90-96] 96 (08/27 0811) Cardiac Rhythm: Normal sinus rhythm (08/27 0701) Resp:  [13-22] 22 (08/27 0811) BP: (91-109)/(45-69) 97/45 (08/27 0813) SpO2:  [92 %-97 %] 94 % (08/27 0811) Weight:  [142 lb 14.4 oz (64.8 kg)] 142 lb 14.4 oz (64.8 kg) (08/27 0300)  Pre op weight 61.6  kg Current Weight  02/24/17 142 lb 14.4 oz (64.8 kg)      Intake/Output from previous day: 08/26 0701 - 08/27 0700 In: 820 [P.O.:820] Out: 800 [Urine:800]   Physical Exam:  Cardiovascular: RRR, soft murmur along left sternal border Pulmonary: Diminished at bases Abdomen: Soft, non tender, bowel sounds present. Extremities: Mild bilateral lower extremity edema. Wounds: Clean and dry.  Skin edge erythema-likely suture reaction, no drainage.  Lab Results: CBC: Recent Labs  02/22/17 0247  WBC 4.1  HGB 8.4*  HCT 26.6*  PLT 96*   BMET:  Recent Labs  02/22/17 0247 02/23/17 0423  NA 136 138  K 4.3 4.0  CL 106 104  CO2 23 24  GLUCOSE 89 109*  BUN 8 5*  CREATININE 0.73 0.58  CALCIUM 8.4* 8.9    PT/INR:  Lab Results  Component Value Date   INR 1.98 02/17/2017   INR 1.12 02/13/2017   INR 1.26 01/29/2017   ABG:  INR: Will add last result for INR, ABG once components are confirmed Will add last 4 CBG results once components are confirmed  Assessment/Plan:  1. CV - SR in the 90's and BP has been labile. Not on BB secondary to this. 2.  Pulmonary - On room air. Encourage incentive spirometer 3. Volume Overload - On Lasix 40 mg daily. Will  continue with Lasix 20 mg daily for 5 days after discharge. 4.  Acute blood loss anemia - Last H and H 8.4 and 26.6. Start oral Ferrous and folic acid. 5. DM-CBGs 105/82/101. On Insulin and Metformin. Will not restart Januvia as risk for CHF. Pre op HGA1C 7.7. Will need further follow up with medical doctor after discharge. 6. Thrombocytopenia-last platelet count 96,000 7. Remove sutures 8. Per Dr. Donata Clay, discharge   ZIMMERMAN,DONIELLE MPA-C 02/24/2017,9:00 AM   patient examined and medical record reviewed,agree with above note. Kathlee Nations Trigt III 02/24/2017

## 2017-02-24 NOTE — Progress Notes (Signed)
Physical Therapy Treatment Patient Details Name: Jenny [name] MRN: [id number] DOB: April 20, 1946 Today's Date: 02/24/2017    History of Present Illness Pt admitted for AVR.      PT Comments    Pt is making good progress towards her goals and is looking forward to going home. Pt currently, minA for bed mobility, transfers from a low surface and is min guard for ambulation of 250 feet with RW. Pt continues to require verbal cuing to maintain her sternal precautions. Pt requires skilled PT to continue to progress gait training and improve LE strength and endurance to safely navigate in her home environment at discharge.   Follow Up Recommendations  Home health PT;Supervision/Assistance - 24 hour     Equipment Recommendations  Other (comment) (tub bench)       Precautions / Restrictions Precautions Precautions: Fall;Sternal Restrictions Weight Bearing Restrictions: Yes (sternal precautions)    Mobility  Bed Mobility Overal bed mobility: Needs Assistance Bed Mobility: Rolling;Supine to Sit Rolling: Min assist   Supine to sit: Min assist     General bed mobility comments: minA for LE management in bed, vc for not pulling herself up in the bed and instead using LE to scoot up in bed   Transfers Overall transfer level: Needs assistance Equipment used: Rolling walker (2 wheeled) Transfers: Sit to/from Stand Sit to Stand: Min assist;Min guard         General transfer comment: min guard for safety with sit>stand from recliner, Slight assist to power up from the toilet, vc for maintaining sternal precautions with hands on knees.    Ambulation/Gait Ambulation/Gait assistance: Min guard Ambulation Distance (Feet): 250 Feet Assistive device: Rolling walker (2 wheeled) Gait Pattern/deviations: Step-through pattern;Decreased stride length;Drifts right/left;Trunk flexed;Wide base of support Gait velocity: slowed Gait velocity interpretation: Below normal speed for  age/gender General Gait Details: min guard for safety, ambulates with very slow cadence, however is very safe, requires vc for navigation of RW around obstacles in hallway       Balance Overall balance assessment: Needs assistance Sitting-balance support: No upper extremity supported;Feet supported Sitting balance-Leahy Scale: Good     Standing balance support: Bilateral upper extremity supported;During functional activity Standing balance-Leahy Scale: Fair Standing balance comment: relies on RW for support                            Cognition Arousal/Alertness: Awake/alert Behavior During Therapy: WFL for tasks assessed/performed Overall Cognitive Status: Within Functional Limits for tasks assessed                                           General Comments General comments (skin integrity, edema, etc.): VSS, son arrived at end of session,       Pertinent Vitals/Pain Pain Assessment: 0-10 Faces Pain Scale: Hurts even more Pain Location: pain L UQ Pain Descriptors / Indicators: Aching Pain Intervention(s): Monitored during session;Limited activity within patient's tolerance    Home Living Family/patient expects to be discharged to:: Private residence Living Arrangements: Spouse/significant other Available Help at Discharge: Family;Available 24 hours/day Type of Home: House Home Access: Stairs to enter Entrance Stairs-Rails: Right;Left;Can reach both Home Layout: Two level;Able to live on main level with bedroom/bathroom (Stays on main floor) Home Equipment: Walker - 2 wheels;Cane - single point;Grab bars - tub/shower      Prior Function Level of  Independence: Independent          PT Goals (current goals can now be found in the care plan section) Acute Rehab PT Goals Patient Stated Goal: to go home PT Goal Formulation: With patient Time For Goal Achievement: 03/07/17 Potential to Achieve Goals: Good Progress towards PT goals:  Progressing toward goals    Frequency    Min 3X/week      PT Plan Current plan remains appropriate       AM-PAC PT "6 Clicks" Daily Activity  Outcome Measure  Difficulty turning over in bed (including adjusting bedclothes, sheets and blankets)?: A Lot Difficulty moving from lying on back to sitting on the side of the bed? : A Lot Difficulty sitting down on and standing up from a chair with arms (e.g., wheelchair, bedside commode, etc,.)?: A Little Help needed moving to and from a bed to chair (including a wheelchair)?: A Little Help needed walking in hospital room?: A Little Help needed climbing 3-5 steps with a railing? : Total 6 Click Score: 14    End of Session Equipment Utilized During Treatment: Gait belt Activity Tolerance: Patient limited by fatigue Patient left: in bed;with call bell/phone within reach Nurse Communication: Mobility status PT Visit Diagnosis: Unsteadiness on feet (R26.81);Muscle weakness (generalized) (M62.81)     Time: 5638-7564 PT Time Calculation (min) (ACUTE ONLY): 32 min  Charges:  $Gait Training: 8-22 mins $Therapeutic Activity: 8-22 mins                    G Codes:       Jenny [name] B. Beverely Risen PT, DPT Acute Rehabilitation  [phone] Pager [phone]     Jenny [name] 02/24/2017, 10:06 AM

## 2017-02-24 NOTE — Discharge Summary (Signed)
Physician Discharge Summary       [address]       [place] [address]             [phone]    Patient ID: Jenny [name] MRN: [id number] DOB/AGE: 69-Aug-1947 69 y.o.  Admit date: 02/17/2017 Discharge date: 02/24/2017  Admission Diagnoses: Severe aortic stenosis  Active Diagnoses:  1.Stroke (HCC) 2.TIA (transient ischemic attack) 3.Panic attacks 4.Iron deficiency anemia 5.Hypothyroidism 6.Diabetes mellitus with complication (HCC) 7. Depression 8.Chronic systolic CHF (congestive heart failure) (HCC) 9. Anxiety  Procedure (s):  Aortic valve replacement using a 19 mm bioprosthetic Magna Ease valve, serial #[id number] by Dr. Donata Clay on 02/17/2017.  History of Presenting Illness: This is a 71 year old female with recent diagnosis of severe aortic stenosis was admitted to [facility] last month with diastolic heart failure, shortness of breath, edema and anemia. She was treated with Lasix and anemia with improved symptoms. She underwent echocardiogram which showed severe aortic stenosis with a mean gradient of 35-40 mmHg and a peak gradient of 64 mmHg. Related aortic valve area 0.75. She has history of a cardiac murmur back to childhood. She is felt to have probable bicuspid aortic stenosis. Coronary arteries have no significant disease. Right heart cath shows elevated wedge of 20 with ejection fraction of 45% with some anterior apical hypokinesia, cardiac index 2.0. The patient is in a sinus rhythm.  No other cardiac valves have significant abnormality.  The patient's previous medical history includes diabetes on oral medication, history of stroke over 20 years ago, history of TIA 5 years ago but now with a normal carotid Doppler. The patient takes excellent care of her teeth and has a dental cleaning done every 6 months. She has full upper bridge and partial denture on the mandible. She will visit her dentist prior to surgery to get a dental  release for cardiac valve replacement.  Dr. Donata Clay discussed the need for aortic valve replacement. Potential risks, benefits, and complications were discussed with the patient and she agreed to proceed with surgery. Pre operative carotid duplex showed no significant internal carotid artery stenosis bilaterally. She was admitted on 02/17/2017 in order to undergo an AVR.  Brief Hospital Course:  The patient was extubated the evening of surgery without difficulty. She remained afebrile and hemodynamically stable. She was initially A paced and on Neo Synephrine. Theone Murdoch, a line, chest tubes, and foley were removed early in the post operative course.She was volume over loaded and diuresed. She had ABL anemia.She did require a post op transfusion. Last H and H was 8.4 and 26.6. She was restarted on oral iron, which she had take prior to admission for IDA history.  Because her blood pressure was labile, she was not restarted on Toprol XL. She will be monitored as an outpatient and hopefully we will be able to restart. She was weaned off the insulin drip.  Once She was tolerating a diet, home diabetic medicine (Metformin) was restarted.  She was not restarted on Januvia as it might contribute to heart failure. The patient's glucose remained well controlled.The patient's HGA1C pre op was  7.7. The patient was felt surgically stable for transfer from the ICU to PCTU for further convalescence on 02/22/2017. She continues to progress with cardiac rehab. She was ambulating on room air. She has been tolerating a diet and has had a bowel movement. Epicardial pacing wires were removed on 02/23/2017. Chest tube sutures will be removed the day of discharge.  The patient is felt surgically stable for discharge today.    Latest Vital Signs: Blood pressure (!) 97/45, pulse 96, temperature 99.1 F (37.3 C), temperature source Oral, resp. rate (!) 22, height 4\' 10"  (1.473 m), weight 142 lb 14.4 oz (64.8 kg), SpO2 94  %.  Physical Exam: Cardiovascular: RRR, soft murmur along left sternal border Pulmonary: Diminished at bases Abdomen: Soft, non tender, bowel sounds present. Extremities: Mild bilateral lower extremity edema. Wounds: Clean and dry.  Skin edge erythema-likely suture reaction, no drainage.  Discharge Condition:Stable and discharged to home.  Recent laboratory studies:  Lab Results  Component Value Date   WBC 4.1 02/22/2017   HGB 8.4 (L) 02/22/2017   HCT 26.6 (L) 02/22/2017   MCV 81.1 02/22/2017   PLT 96 (L) 02/22/2017   Lab Results  Component Value Date   NA 138 02/23/2017   K 4.0 02/23/2017   CL 104 02/23/2017   CO2 24 02/23/2017   CREATININE 0.58 02/23/2017   GLUCOSE 109 (H) 02/23/2017    Diagnostic Studies: Dg Chest 2 View  Result Date: 02/23/2017 CLINICAL DATA:  Postop from aortic valve replacement. EXAM: CHEST  2 VIEW COMPARISON:  02/22/2017 FINDINGS: Stable mild cardiomegaly. Stable diffuse interstitial infiltrates, consistent with mild interstitial edema. Stable bibasilar atelectasis and small bilateral pleural effusions. IMPRESSION: No significant change and mild interstitial edema, small bilateral pleural effusions, and bibasilar atelectasis. Electronically Signed   By: Myles Rosenthal M.D.   On: 02/23/2017 07:32   Ct Chest Wo Contrast  Result Date: 02/13/2017 CLINICAL DATA:  Aortic valve stenosis. EXAM: CT CHEST WITHOUT CONTRAST TECHNIQUE: Multidetector CT imaging of the chest was performed following the standard protocol without IV contrast. COMPARISON:  Radiographs of January 16, 2017. FINDINGS: Cardiovascular: Atherosclerosis of thoracic aorta is noted without aneurysm formation. Extensive calcifications are seen involving the aortic valve. Ascending thoracic aorta measures 3.5 cm in diameter. Proximal descending thoracic aorta measures 2.2 cm. Normal cardiac size. No pericardial effusion is noted. Coronary artery calcifications are noted. Mediastinum/Nodes: No enlarged  mediastinal or axillary lymph nodes. Thyroid gland, trachea, and esophagus demonstrate no significant findings. Lungs/Pleura: Minimal scarring is noted in both lung bases. No acute consolidative process is noted. No pleural effusion or pneumothorax. Upper Abdomen: No acute abnormality. Musculoskeletal: No chest wall mass or suspicious bone lesions identified. IMPRESSION: Coronary artery calcifications are noted suggesting coronary artery disease. Aortic valve calcifications are noted as well. Aortic Atherosclerosis (ICD10-I70.0). Electronically Signed   By: Lupita Raider, M.D.   On: 02/13/2017 12:00    Discharge Instructions    Amb Referral to Cardiac Rehabilitation    Complete by:  As directed    Diagnosis:  Valve Replacement   Valve:  Aortic      Discharge Medications: Allergies as of 02/24/2017      Reactions   Iodine Swelling   ANGIOEDEMA FACIAL SWELLING "BETADINE OKAY"   Shellfish Allergy Anaphylaxis      Medication List    STOP taking these medications   JANUVIA 100 MG tablet Generic drug:  sitaGLIPtin   losartan 25 MG tablet Commonly known as:  COZAAR   metoprolol succinate 25 MG 24 hr tablet Commonly known as:  TOPROL XL     TAKE these medications   acetaminophen 325 MG tablet Commonly known as:  TYLENOL Take 2 tablets (650 mg total) by mouth every 6 (six) hours as needed for mild pain, fever or headache. What changed:  Another medication with the same name was removed. Continue taking this  medication, and follow the directions you see here.   ALKA-SELTZER EXTRA STRENGTH PO Take 1 tablet by mouth daily as needed.   ALPRAZolam 0.5 MG tablet Commonly known as:  XANAX Take 0.5-1 mg by mouth 2 (two) times daily. Take 0.5 mg in the morning and 1 mg at night.   aspirin 81 MG EC tablet Take 1 tablet (81 mg total) by mouth daily.   ferrous sulfate 325 (65 FE) MG tablet Take 1 tablet (325 mg total) by mouth 2 (two) times daily with a meal.   folic acid 1 MG  tablet Commonly known as:  FOLVITE Take 1 tablet (1 mg total) by mouth daily. For one month then stop.   furosemide 40 MG tablet Commonly known as:  LASIX Take 0.5 tablets (20 mg total) by mouth daily. For 5 days then stop. What changed:  how much to take  additional instructions   hydrocortisone cream 1 % Apply 1 application topically 3 (three) times daily as needed for itching.   hydrOXYzine 25 MG tablet Commonly known as:  ATARAX/VISTARIL Take 50 mg by mouth at bedtime.   LANTUS 100 UNIT/ML injection Generic drug:  insulin glargine Inject 21 Units into the skin daily.   levothyroxine 88 MCG tablet Commonly known as:  SYNTHROID, LEVOTHROID Take 88 mcg by mouth daily before breakfast.   metFORMIN 500 MG tablet Commonly known as:  GLUCOPHAGE Take 500 mg by mouth 2 (two) times daily.   oxyCODONE 5 MG immediate release tablet Commonly known as:  Oxy IR/ROXICODONE Take 5 mg by mouth every 4-6 hours PRN severe pain.   oxymetazoline 0.05 % nasal spray Commonly known as:  AFRIN Place 1 spray into both nostrils 2 (two) times daily as needed for congestion.   potassium chloride 10 MEQ tablet Commonly known as:  K-DUR,KLOR-CON Take 1 tablet (10 mEq total) by mouth daily. For 5 days then stop.   rosuvastatin 10 MG tablet Commonly known as:  CRESTOR Take 10 mg by mouth at bedtime.   sertraline 100 MG tablet Commonly known as:  ZOLOFT Take 100 mg by mouth 2 (two) times daily.            Discharge Care Instructions        Start     Ordered   02/25/17 0000  aspirin EC 81 MG EC tablet  Daily     02/24/17 0929   02/24/17 0000  folic acid (FOLVITE) 1 MG tablet  Daily     02/24/17 0929   02/24/17 0000  oxyCODONE (OXY IR/ROXICODONE) 5 MG immediate release tablet    Question:  Supervising Provider  Answer:  Donata Clay, PETER   02/24/17 0929   02/24/17 0000  furosemide (LASIX) 40 MG tablet  Daily    Question:  Supervising Provider  Answer:  Donata Clay, PETER   02/24/17  0929   02/24/17 0000  potassium chloride SA (K-DUR,KLOR-CON) 10 MEQ tablet  Daily    Question:  Supervising Provider  Answer:  Donata Clay, PETER   02/24/17 0929   02/22/17 0000  Amb Referral to Cardiac Rehabilitation    Question Answer Comment  Diagnosis: Valve Replacement   Valve: Aortic      02/22/17 1118     The patient has been discharged on:   1.Beta Blocker:  Yes [   ]  No   [  x ]                              If No, reason: Labile BP but will try to restart as an outpatient  2.Ace Inhibitor/ARB: Yes [   ]                                     No  [   x ]                                     If No, reason:BP labile  3.Statin:   Yes [  x ]                  No  [   ]                  If No, reason:  4.Ecasa:  Yes  [x   ]                  No   [   ]                  If No, reason:  Follow Up Appointments: Follow-up Information    Kerin Perna, MD Follow up on 03/26/2017.   Specialty:  Cardiothoracic Surgery Why:  PA/LAT CXR to be taken (at [facility] which is in the same building as Dr. Zenaida Niece Trigt's office) on 03/26/2017 at 11:00 am;Appointment time is at 11:30 am Contact information: [address] [place] [address] [phone]        Iran Ouch, MD Follow up.   Specialty:  Cardiology Why:  Call for a follow up appointment for 2 weeks Contact information: [address] [place] [address] [phone]        Corky Downs, MD Follow up.   Specialty:  Internal Medicine Why:  Call for a follow up appointment regarding further management of diabetes and surveillnace of HGA1C 7.7 Contact information: [address] [place] [address] [phone]           Signed: Doree Fudge MPA-C 02/24/2017, 10:02 AM   DC instructions reviewed with Patient

## 2017-02-24 NOTE — Progress Notes (Signed)
Pt refused to ambulate this AM.

## 2017-02-24 NOTE — Care Management Note (Signed)
Case Management Note Original Note Created Leone Haven, RN 02/18/2017, 3:32 PM  Patient Details  Name: Jenny [name] MRN: [id number] Date of Birth: 1946-04-14  Subjective/Objective:   From home with spouse, POD 1 AVR, conts onm iv abx, insulin drip, neo drip , and Iv lasix.  Await pt eval. She has a rolling walker at home that she used when she broke her foot, but has not had to use it since.                 Action/Plan: NCM will follow for dc needs.   Expected Discharge Date:  02/24/17               Expected Discharge Plan:  Home w Home Health Services  In-House Referral:     Discharge planning Services  CM Consult  Post Acute Care Choice:  Home Health Choice offered to:  Patient, Adult Children  DME Arranged:  N/A DME Agency:  NA  HH Arranged:  RN, Disease Management HH Agency:  [facility]  Status of Service:  Completed, signed off  If discussed at Long Length of Stay Meetings, dates discussed:    Discharge Disposition: home/home health   Additional Comments:  02/24/17- 1000- Rikia Sukhu RN, CM- pt for d/c home today- order placed for [facility]- recommendation for HHPT- spoke with pt at bedside- pt conversation with pt she is agreeable to [facility] however she does not feel like she needs HHPT- choice offered for [facility] agency in [facility]- per pt she would like to use [facility] however they do not have staffing for RN services- spoke with pt again for choice- and pt states as long as CM finds an agency that has staffing that works with her insurance that she would be ok with any of agencies on list- explained that pt may have copays depending on her policy. Call made to [facility] - spoke with [facility] regarding referral- per Delila Spence can accept referral and has staffing for services- pt informed and is ok with this arrangement- per pt she will start new insurance Sept 1- reminded her that she will need to be sure to give new card to [facility]. Per pt she has all  needed DME at home including RW.   Zenda Alpers Narcissa, RN 02/24/2017, 12:31 PM [phone]

## 2017-03-04 ENCOUNTER — Encounter: Payer: Self-pay | Admitting: Family

## 2017-03-04 ENCOUNTER — Ambulatory Visit: Payer: BLUE CROSS/BLUE SHIELD | Attending: Family | Admitting: Family

## 2017-03-04 VITALS — BP 145/54 | HR 92 | Resp 18 | Ht <= 58 in | Wt 135.2 lb

## 2017-03-04 DIAGNOSIS — Z79899 Other long term (current) drug therapy: Secondary | ICD-10-CM | POA: Diagnosis not present

## 2017-03-04 DIAGNOSIS — Z9049 Acquired absence of other specified parts of digestive tract: Secondary | ICD-10-CM | POA: Diagnosis not present

## 2017-03-04 DIAGNOSIS — I5022 Chronic systolic (congestive) heart failure: Secondary | ICD-10-CM

## 2017-03-04 DIAGNOSIS — Z7982 Long term (current) use of aspirin: Secondary | ICD-10-CM | POA: Insufficient documentation

## 2017-03-04 DIAGNOSIS — K589 Irritable bowel syndrome without diarrhea: Secondary | ICD-10-CM | POA: Insufficient documentation

## 2017-03-04 DIAGNOSIS — I350 Nonrheumatic aortic (valve) stenosis: Secondary | ICD-10-CM | POA: Diagnosis not present

## 2017-03-04 DIAGNOSIS — D509 Iron deficiency anemia, unspecified: Secondary | ICD-10-CM | POA: Insufficient documentation

## 2017-03-04 DIAGNOSIS — F329 Major depressive disorder, single episode, unspecified: Secondary | ICD-10-CM | POA: Diagnosis not present

## 2017-03-04 DIAGNOSIS — Z8673 Personal history of transient ischemic attack (TIA), and cerebral infarction without residual deficits: Secondary | ICD-10-CM | POA: Diagnosis not present

## 2017-03-04 DIAGNOSIS — I2510 Atherosclerotic heart disease of native coronary artery without angina pectoris: Secondary | ICD-10-CM | POA: Diagnosis not present

## 2017-03-04 DIAGNOSIS — E039 Hypothyroidism, unspecified: Secondary | ICD-10-CM | POA: Diagnosis not present

## 2017-03-04 DIAGNOSIS — F419 Anxiety disorder, unspecified: Secondary | ICD-10-CM | POA: Insufficient documentation

## 2017-03-04 DIAGNOSIS — E119 Type 2 diabetes mellitus without complications: Secondary | ICD-10-CM | POA: Diagnosis not present

## 2017-03-04 DIAGNOSIS — Z952 Presence of prosthetic heart valve: Secondary | ICD-10-CM | POA: Insufficient documentation

## 2017-03-04 DIAGNOSIS — Z794 Long term (current) use of insulin: Secondary | ICD-10-CM | POA: Diagnosis not present

## 2017-03-04 DIAGNOSIS — Z87891 Personal history of nicotine dependence: Secondary | ICD-10-CM | POA: Insufficient documentation

## 2017-03-04 NOTE — Patient Instructions (Signed)
Continue weighing daily and call for an overnight weight gain of > 2 pounds or a weekly weight gain of >5 pounds. 

## 2017-03-04 NOTE — Progress Notes (Signed)
Patient ID: Jenny [name], female    DOB: 01-24-1946, 71 y.o.   MRN: [id number]  HPI  Jenny [name] is a 71 y/o female with a history of DM, stroke, thyroid disease, depression, anxiety, severe aortic stenosis, TIA, iron deficiency anemia, previous tobacco use and chronic heart failure.  Reviewed echo from 01/15/17 which showed an EF of 45-50% along with severe AS and mild MR. Cardiac catheterization done 02/03/17 showed mild nonobstructive CAD with mildly elevated filling pressure, high-normal pulmonary pressure and normal cardiac output. Heavily calcified aortic valve with restricted opening.   Admitted 02/17/17 due to severe aortic stenosis. Had aortic valve replaced and was discharged home after 7 days. Did received a post-up blood transfusion.  Admitted 01/15/17 due to HF exacerbation. Cardiology consult obtained. NSTEMI ruled out. Received 1 unit of PRBC's due to anemia. Discharged home after 2 days.   She presents today for Jenny [name] follow-up visit with a chief complaint of mild fatigue with moderate exertion. She describes this as having been present for several years with varying levels of severity. Does feel like it's slowly improving since having Jenny [name] aortic valve fixed. She has associated cough, light-headedness and sternal pain along with this. Denies any shortness of breath, pedal edema or weight gain.   Past Medical History:  Diagnosis Date  . Anxiety   . Arthritis   . Chronic systolic CHF (congestive heart failure) (HCC)    a. TTE 12/2016, EF 45-50%, probable hypokinesis of the mid apical anteroseptal, anterior, & apical myocardium, GR2DD, possibly bicuspid aortic valve that was moderately thickened w/ severely calcified leaflets, severe aortic stenosis w/ mean gradient 47 mmHg, valve area 0.52 cm, mild MR, mildly dilated LA  . Depression   . Diabetes mellitus with complication (HCC)    Tylenol  . Heart murmur   . Hypothyroidism   . IBS (irritable bowel syndrome)   . Iron deficiency  anemia    a. s/p pRBC x1 in 12/2016  . Panic attacks   . Severe aortic stenosis    a. TTE 12/2016: possibly bicuspid aortic valve that was moderately thickened with severely calcified leaflets. There was severe aortic stenosis with a mean gradient of 47 mmHg and valve area of 0.52 cm  . Stroke (HCC) 2001  . TIA (transient ischemic attack) 2006   Past Surgical History:  Procedure Laterality Date  . ABDOMINAL HYSTERECTOMY    . AORTIC VALVE REPLACEMENT N/A 02/17/2017   Procedure: AORTIC VALVE REPLACEMENT (AVR);  Surgeon: Donata Clay, Theron Arista, MD;  Location: [facility] OR;  Service: Open Heart Surgery;  Laterality: N/A;  . CATARACT EXTRACTION W/PHACO Right 04/09/2016   Procedure: CATARACT EXTRACTION PHACO AND INTRAOCULAR LENS PLACEMENT (IOC);  Surgeon: Galen Manila, MD;  Location: [facility] ORS;  Service: Ophthalmology;  Laterality: Right;  [place] 57.2 AP% 18.6 CDE 10.64 Fluid Pack lot # [id number]  . CHOLECYSTECTOMY    . COLONOSCOPY    . CORONARY ANGIOGRAPHY N/A 02/03/2017   Procedure: CORONARY ANGIOGRAPHY;  Surgeon: Iran Ouch, MD;  Location: [facility] INVASIVE CV LAB;  Service: Cardiovascular;  Laterality: N/A;  . FRACTURE SURGERY     LEFT ANKLE  . RIGHT AND LEFT HEART CATH Bilateral 02/03/2017   Procedure: Right and Left Heart Cath;  Surgeon: Iran Ouch, MD;  Location: [facility] INVASIVE CV LAB;  Service: Cardiovascular;  Laterality: Bilateral;  . TEE WITHOUT CARDIOVERSION N/A 02/17/2017   Procedure: TRANSESOPHAGEAL ECHOCARDIOGRAM (TEE);  Surgeon: Donata Clay, Theron Arista, MD;  Location: [facility] OR;  Service: Open Heart Surgery;  Laterality:  N/A;   Family History  Problem Relation Age of Onset  . Hypertension Mother    Social History  Substance Use Topics  . Smoking status: Former Smoker    Years: 30.00    Quit date: 02/04/2000  . Smokeless tobacco: Never Used  . Alcohol use No   Allergies  Allergen Reactions  . Iodine Swelling    ANGIOEDEMA FACIAL SWELLING "BETADINE OKAY"  . Shellfish Allergy Anaphylaxis    Prior to Admission medications   Medication Sig Start Date End Date Taking? Authorizing Provider  acetaminophen (TYLENOL) 325 MG tablet Take 2 tablets (650 mg total) by mouth every 6 (six) hours as needed for mild pain, fever or headache. 01/17/17  Yes Enid Baas, MD  ALPRAZolam Prudy Feeler) 0.5 MG tablet Take 0.5-1 mg by mouth 2 (two) times daily. Take 0.5 mg in the morning and 1 mg at night.   Yes [provider]  hydrocortisone cream 1 % Apply 1 application topically 3 (three) times daily as needed for itching.   Yes [provider]  hydrOXYzine (ATARAX/VISTARIL) 25 MG tablet Take 50 mg by mouth at bedtime.  12/16/16  Yes [provider]  LANTUS 100 UNIT/ML injection Inject 20 Units into the skin daily.  12/23/16  Yes [provider]  levothyroxine (SYNTHROID, LEVOTHROID) 88 MCG tablet Take 88 mcg by mouth daily before breakfast.   Yes [provider]  oxymetazoline (AFRIN) 0.05 % nasal spray Place 1 spray into both nostrils 2 (two) times daily as needed for congestion.   Yes [provider]  sertraline (ZOLOFT) 100 MG tablet Take 100 mg by mouth 2 (two) times daily.   Yes [provider]  Aspirin Effervescent (ALKA-SELTZER EXTRA STRENGTH PO) Take 1 tablet by mouth daily as needed.     [provider]  ferrous sulfate 325 (65 FE) MG tablet Take 1 tablet (325 mg total) by mouth 2 (two) times daily with a meal. Patient not taking: Reported on 02/11/2017 01/17/17   Enid Baas, MD  folic acid (FOLVITE) 1 MG tablet Take 1 tablet (1 mg total) by mouth daily. For one month then stop. Patient not taking: Reported on 03/04/2017 02/24/17   Ardelle Balls, PA-C  furosemide (LASIX) 40 MG tablet Take 0.5 tablets (20 mg total) by mouth daily. For 5 days then stop. Patient not taking: Reported on 03/04/2017 02/24/17 02/24/18  Ardelle Balls, PA-C  oxyCODONE (OXY IR/ROXICODONE) 5 MG immediate release tablet Take 5 mg by  mouth every 4-6 hours PRN severe pain. Patient not taking: Reported on 03/04/2017 02/24/17   Ardelle Balls, PA-C  potassium chloride SA (K-DUR,KLOR-CON) 10 MEQ tablet Take 1 tablet (10 mEq total) by mouth daily. For 5 days then stop. Patient not taking: Reported on 03/04/2017 02/24/17   Ardelle Balls, PA-C  rosuvastatin (CRESTOR) 10 MG tablet Take 10 mg by mouth at bedtime.  11/15/16   [provider]    Review of Systems  Constitutional: Positive for appetite change (decreased since she got dentures one year ago) and fatigue.  HENT: Positive for tinnitus. Negative for congestion, rhinorrhea and sore throat.   Eyes: Negative.   Respiratory: Positive for cough. Negative for chest tightness and shortness of breath.   Cardiovascular: Negative for chest pain, palpitations and leg swelling.  Gastrointestinal: Negative for abdominal distention and abdominal pain.  Endocrine: Negative.   Genitourinary: Negative.   Musculoskeletal: Positive for arthralgias (sternal pain). Negative for back pain and neck pain.  Skin: Negative.  Allergic/Immunologic: Negative.   Neurological: Positive for light-headedness. Negative for dizziness.  Hematological: Negative for adenopathy. Does not bruise/bleed easily.  Psychiatric/Behavioral: Negative for dysphoric mood, sleep disturbance and suicidal ideas. The patient is not nervous/anxious.    Vitals:   03/04/17 1320  BP: (!) 145/54  Pulse: 92  Resp: 18  SpO2: 100%  Weight: 135 lb 4 oz (61.3 kg)  Height: 4\' 10"  (1.473 m)   Wt Readings from Last 3 Encounters:  03/04/17 135 lb 4 oz (61.3 kg)  02/24/17 142 lb 14.4 oz (64.8 kg)  02/13/17 135 lb 14.4 oz (61.6 kg)    Lab Results  Component Value Date   CREATININE 0.58 02/23/2017   CREATININE 0.73 02/22/2017   CREATININE 0.57 02/21/2017    Physical Exam  Constitutional: She is oriented to person, place, and time. She appears well-developed and well-nourished.  HENT:  Head:  Normocephalic and atraumatic.  Neck: Normal range of motion. Neck supple. No JVD present.  Cardiovascular: Normal rate and regular rhythm.   Murmur heard.  Systolic murmur is present with a grade of 3/6  Murmur at right sternal border  Pulmonary/Chest: Effort normal. She has no wheezes. She has no rales.  Abdominal: Soft. She exhibits no distension. There is no tenderness.  Musculoskeletal: She exhibits no edema or tenderness.  Neurological: She is alert and oriented to person, place, and time.  Skin: Skin is warm and dry.  Psychiatric: She has a normal mood and affect. Jenny [name] behavior is normal. Thought content normal.  Nursing note and vitals reviewed.  Assessment & Plan:  1: Chronic heart failure with reduced ejection fraction- - NYHA class II - euvolemic today - weighing daily; instructed to call for an overnight weight gain of >2 pounds or a weekly weight gain of >5 pounds.  - not adding salt and trying to eat low sodium foods - unsure of how much fluid she drinks daily. Advised Jenny [name] to measure how much liquid Jenny [name] cup holds so that she can keep fluid intake to close to 60 ounces daily - BMP from 02/23/17 reviewed and shows potassium 4.0 and GFR >60 - saw cardiology (Dunn) 01/22/17 and returns to him 03/12/17 - does not meet ReDS vest criteria   2: Aortic stenosis- - RHC/LHC done 02/03/17 - AVR done 02/17/17 - reports having continued sternal pain from surgery and is requesting that Jenny [name] oxycodone be refilled. Advised Jenny [name] that I do not prescribe narcotics and that she will need to ask either Jenny [name] PCP or the surgeon for a refill.   3: Diabetes- - follows with PCP (Masoud) regarding this - hasn't been checking Jenny [name] glucose consistently but says that last time she checked it, it was "high". Just resumed Jenny [name] insulin today  Patient did not bring Jenny [name] medications nor a list. Each medication was verbally reviewed with the patient and she was encouraged to bring the bottles to every visit to confirm  accuracy of list.  Patient does not want to make a return appointment at this time. Advised Jenny [name] that she could call back at anytime to schedule another appointment.

## 2017-03-11 ENCOUNTER — Other Ambulatory Visit: Payer: Self-pay | Admitting: *Deleted

## 2017-03-11 DIAGNOSIS — G8918 Other acute postprocedural pain: Secondary | ICD-10-CM

## 2017-03-11 MED ORDER — TRAMADOL HCL 50 MG PO TABS: 50.0000 mg | ORAL_TABLET | Freq: Four times a day (QID) | ORAL | 0 refills | Status: DC | PRN

## 2017-03-11 NOTE — Telephone Encounter (Signed)
Mrs. Salvas is s/p AVR 02/17/17 and was discharged with Oxycodone on 02/24/17. She is calling for a refill. I offered her to try Tramadol and she agreed. I said if it was not effective she could revert back to the Oxycodone. A script was faxed to her pharmacy.

## 2017-03-12 ENCOUNTER — Ambulatory Visit (INDEPENDENT_AMBULATORY_CARE_PROVIDER_SITE_OTHER): Payer: BLUE CROSS/BLUE SHIELD | Admitting: Nurse Practitioner

## 2017-03-12 ENCOUNTER — Other Ambulatory Visit
Admission: RE | Admit: 2017-03-12 | Discharge: 2017-03-12 | Disposition: A | Discharge status: Home / Self Care | Payer: BLUE CROSS/BLUE SHIELD | Source: Ambulatory Visit | Attending: Nurse Practitioner | Admitting: Nurse Practitioner

## 2017-03-12 ENCOUNTER — Encounter: Payer: Self-pay | Admitting: Nurse Practitioner

## 2017-03-12 VITALS — BP 104/52 | HR 79 | Ht <= 58 in | Wt 132.0 lb

## 2017-03-12 DIAGNOSIS — I350 Nonrheumatic aortic (valve) stenosis: Secondary | ICD-10-CM

## 2017-03-12 DIAGNOSIS — D509 Iron deficiency anemia, unspecified: Secondary | ICD-10-CM | POA: Diagnosis not present

## 2017-03-12 DIAGNOSIS — E782 Mixed hyperlipidemia: Secondary | ICD-10-CM | POA: Diagnosis not present

## 2017-03-12 LAB — BASIC METABOLIC PANEL
Anion gap: 8 (ref 5–15)
BUN: 9 mg/dL (ref 6–20)
CO2: 27 mmol/L (ref 22–32)
CREATININE: 0.6 mg/dL (ref 0.44–1.00)
Calcium: 9 mg/dL (ref 8.9–10.3)
Chloride: 101 mmol/L (ref 101–111)
GFR calc Af Amer: >60 mL/min (ref >60)
GLUCOSE: 253 mg/dL — AB (ref 65–99)
Potassium: 4.5 mmol/L (ref 3.5–5.1)
Sodium: 136 mmol/L (ref 135–145)

## 2017-03-12 LAB — CBC
HEMATOCRIT: 32.2 % — AB (ref 35.0–47.0)
Hemoglobin: 10.6 g/dL — ABNORMAL LOW (ref 12.0–16.0)
MCH: 25.7 pg — ABNORMAL LOW (ref 26.0–34.0)
MCHC: 32.7 g/dL (ref 32.0–36.0)
MCV: 78.4 fL — AB (ref 80.0–100.0)
PLATELETS: 162 10*3/uL (ref 150–440)
RBC: 4.11 MIL/uL (ref 3.80–5.20)
RDW: 20.3 % — AB (ref 11.5–14.5)
WBC: 4.1 10*3/uL (ref 3.6–11.0)

## 2017-03-12 MED ORDER — ROSUVASTATIN CALCIUM 10 MG PO TABS: 10.0000 mg | ORAL_TABLET | Freq: Every day | ORAL | 3 refills | Status: DC

## 2017-03-12 NOTE — Patient Instructions (Addendum)
Medication Instructions:  Your physician has recommended you make the following change in your medication:  RESUME taking crestor 10mg  once daily   Labwork: BMET, CBC  Testing/Procedures: Your physician has requested that you have an echocardiogram in one month. Echocardiography is a painless test that uses sound waves to create images of your heart. It provides your doctor with information about the size and shape of your heart and how well your heart's chambers and valves are working. This procedure takes approximately one hour. There are no restrictions for this procedure.    Follow-Up: Your physician recommends that you schedule a follow-up appointment in: 2-3 months with Dr. .    Any Other Special Instructions Will Be Listed Below (If Applicable).     If you need a refill on your cardiac medications before your next appointment, please call your pharmacy.  Echocardiogram An echocardiogram, or echocardiography, uses sound waves (ultrasound) to produce an image of your heart. The echocardiogram is simple, painless, obtained within a short period of time, and offers valuable information to your health care provider. The images from an echocardiogram can provide information such as:  Evidence of coronary artery disease (CAD).  Heart size.  Heart muscle function.  Heart valve function.  Aneurysm detection.  Evidence of a past heart attack.  Fluid buildup around the heart.  Heart muscle thickening.  Assess heart valve function.  Tell a health care provider about:  Any allergies you have.  All medicines you are taking, including vitamins, herbs, eye drops, creams, and over-the-counter medicines.  Any problems you or family members have had with anesthetic medicines.  Any blood disorders you have.  Any surgeries you have had.  Any medical conditions you have.  Whether you are pregnant or may be pregnant. What happens before the procedure? No special  preparation is needed. Eat and drink normally. What happens during the procedure?  In order to produce an image of your heart, gel will be applied to your chest and a wand-like tool (transducer) will be moved over your chest. The gel will help transmit the sound waves from the transducer. The sound waves will harmlessly bounce off your heart to allow the heart images to be captured in real-time motion. These images will then be recorded.  You may need an IV to receive a medicine that improves the quality of the pictures. What happens after the procedure? You may return to your normal schedule including diet, activities, and medicines, unless your health care provider tells you otherwise. This information is not intended to replace advice given to you by your health care provider. Make sure you discuss any questions you have with your health care provider. Document Released: 06/14/2000 Document Revised: 02/03/2016 Document Reviewed: 02/22/2013 Elsevier Interactive Patient Education  2017 2018.

## 2017-03-12 NOTE — Progress Notes (Signed)
Office Visit    Patient Name: Jenny [name] Date of Encounter: 03/12/2017  Primary Care Provider:  Corky Downs, MD Primary Cardiologist:  Judie Petit. Kirke Corin, MD   Chief Complaint    71 year old female with a history of severe aortic stenosis, hyperlipidemia, diabetes, iron deficiency anemia, and stroke, who presents for follow-up after recent aortic valve replacement.  Past Medical History    Past Medical History:  Diagnosis Date  . Anxiety   . Arthritis   . CAD (coronary artery disease)    a. 01/2017 Cath: nonobs dzs.  . Chronic systolic CHF (congestive heart failure) (HCC)    a. TTE 12/2016, EF 45-50%, probable hypokinesis of the mid apical anteroseptal, anterior, & apical myocardium, GR2DD, possibly bicuspid aortic valve that was moderately thickened w/ severely calcified leaflets, severe aortic stenosis w/ mean gradient 47 mmHg, valve area 0.52 cm, mild MR, mildly dilated LA  . Depression   . Diabetes mellitus with complication (HCC)    Tylenol  . Hyperlipidemia   . Hypothyroidism   . IBS (irritable bowel syndrome)   . Iron deficiency anemia    a. s/p pRBC x1 in 12/2016  . Microcytic anemia   . Panic attacks   . Severe aortic stenosis    a. TTE 12/2016: possibly bicuspid aortic valve that was moderately thickened with severely calcified leaflets. There was severe aortic stenosis with a mean gradient of 47 mmHg and valve area of 0.52 cm; b. 01/2017 s/p AVR w/ 19 mm bioprosthetic Magna Ease pericardial tissue valve, ser# [id number].  . Stroke (HCC) 2001  . TIA (transient ischemic attack) 2006   Past Surgical History:  Procedure Laterality Date  . ABDOMINAL HYSTERECTOMY    . AORTIC VALVE REPLACEMENT N/A 02/17/2017   Procedure: AORTIC VALVE REPLACEMENT (AVR);  Surgeon: Donata Clay, Theron Arista, MD;  Location: [facility] OR;  Service: Open Heart Surgery;  Laterality: N/A;  . CATARACT EXTRACTION W/PHACO Right 04/09/2016   Procedure: CATARACT EXTRACTION PHACO AND INTRAOCULAR LENS PLACEMENT (IOC);   Surgeon: Galen Manila, MD;  Location: [facility] ORS;  Service: Ophthalmology;  Laterality: Right;  [place] 57.2 AP% 18.6 CDE 10.64 Fluid Pack lot # [id number]  . CHOLECYSTECTOMY    . COLONOSCOPY    . CORONARY ANGIOGRAPHY N/A 02/03/2017   Procedure: CORONARY ANGIOGRAPHY;  Surgeon: Iran Ouch, MD;  Location: [facility] INVASIVE CV LAB;  Service: Cardiovascular;  Laterality: N/A;  . FRACTURE SURGERY     LEFT ANKLE  . RIGHT AND LEFT HEART CATH Bilateral 02/03/2017   Procedure: Right and Left Heart Cath;  Surgeon: Iran Ouch, MD;  Location: [facility] INVASIVE CV LAB;  Service: Cardiovascular;  Laterality: Bilateral;  . TEE WITHOUT CARDIOVERSION N/A 02/17/2017   Procedure: TRANSESOPHAGEAL ECHOCARDIOGRAM (TEE);  Surgeon: Donata Clay, Theron Arista, MD;  Location: [facility] OR;  Service: Open Heart Surgery;  Laterality: N/A;    Allergies  Allergies  Allergen Reactions  . Iodine Swelling    ANGIOEDEMA FACIAL SWELLING "BETADINE OKAY"  . Shellfish Allergy Anaphylaxis    History of Present Illness    71 year old female with the above complex past medical history including severe aortic stenosis, hyperlipidemia, diabetes, iron deficiency anemia, and stroke. She was recently admitted to [facility] in July of this year for acute combined heart failure in the setting of severe aortic stenosis noted on echocardiogram. She was also found to be anemic with mild troponin elevation. She did require blood transfusion. It was felt that she would require right and left heart cardiac catheterization once hemoglobin was  stable. She followed up in the office on July 25 and subsequently underwent right and left heart cardiac catheterization by Dr. Kirke Corin revealing minimal nonobstructive CAD. The aortic valve was not crossed. She was referred to cardiothoracic surgery and subsequently underwent aortic valve replacement with a bioprosthetic valve on August 20. Postoperative course was relatively uneventful. Blood pressures were labile  and relatively soft though she was able to be placed on low-dose beta blocker prior to discharge.  Since discharge, she has done well. Surgical wounds have been healing well and she has not had any significant chest wall pain. She denies dyspnea, PND, orthopnea, dizziness, syncope, edema, or early satiety.  Home Medications    Prior to Admission medications   Medication Sig Start Date End Date Taking? Authorizing Provider  ALPRAZolam Prudy Feeler) 0.5 MG tablet Take 0.5-1 mg by mouth 2 (two) times daily. Take 0.5 mg in the morning and 1 mg at night.   Yes [provider]  hydrOXYzine (ATARAX/VISTARIL) 25 MG tablet Take 50 mg by mouth at bedtime.  12/16/16  Yes [provider]  LANTUS 100 UNIT/ML injection Inject 20 Units into the skin daily.  12/23/16  Yes [provider]  METOPROLOL SUCCINATE PO Take 25 mg by mouth daily. 1/2 tablet daily   Yes [provider]  traMADol (ULTRAM) 50 MG tablet Take by mouth every 6 (six) hours as needed.   Yes [provider]  rosuvastatin (CRESTOR) 10 MG tablet Take 1 tablet (10 mg total) by mouth daily. 03/12/17 06/10/17  Ok Anis, NP    Review of Systems    Mild chest wall pain but overall doing well.  She has mild numbness and tingling in her fingertips ever since her catheterization. She denies palpitations, dyspnea, pnd, orthopnea, n, v, dizziness, syncope, edema, weight gain, or early satiety.  All other systems reviewed and are otherwise negative except as noted above.  Physical Exam    VS:  BP (!) 104/52 (BP Location: Left Arm, Patient Position: Sitting, Cuff Size: Normal)   Pulse 79   Ht 4\' 10"  (1.473 m)   Wt 132 lb (59.9 kg)   BMI 27.59 kg/m  , BMI Body mass index is 27.59 kg/m. GEN: Well nourished, well developed, in no acute distress.  HEENT: normal.  Neck: Supple, no JVD, carotid bruits, or masses. Cardiac: RRR, 2/6 systolic ejection murmur throughout the sternal border, no rubs, or gallops.  No clubbing, cyanosis, edema.  Radials/DP/PT 2+ and equal bilaterally. Midline sternal surgical incision is healing well without significant erythema or drainage. 2 upper abdominal sites are scabbed and healing well without erythema or drainage. Extremities: Right forearm has the appearance of mild ecchymosis, though she notes this is secondary to infiltration of IV iron prior to her catheterization. She has good capillary refill and normal radial and ulnar pulses. Normal Allen's test. Respiratory:  Respirations regular and unlabored, clear to auscultation bilaterally. GI: Soft, nontender, nondistended, BS + x 4. MS: no deformity or atrophy. Skin: warm and dry, no rash. Neuro:  Strength and sensation are intact. Psych: Normal affect.  Accessory Clinical Findings    ECG - Regular sinus rhythm, 79, incomplete right bundle branch block  Assessment & Plan    1.  Severe aortic stenosis: Status post bioprosthetic aortic valve replacement on August 20. Relatively complicated postoperative course. She's been doing well in the outpatient setting.  She has had minimal chest wall pain. She denies angina or dyspnea on exertion. She has not had any heart failure  symptoms. Plan follow-up echo in the next 4-6 weeks to establish new baseline. She has follow-up with CT surgery later this month.  2. Hyperlipidemia: Patient had previous been on Crestor but she stopped taking it. The setting of history of diabetes,nonobstructive CAD, and prior stroke, I did advise that she resume Crestor 10 mg daily. She has this at home already. LDL was 37 on July 18 in the setting of Crestor therapy.  3. Diabetes mellitus: She remains on Lantus. Hemoglobin A1c was 7.7 preoperatively.  4. Microcytic/iron deficiency anemia: Follow-up H&H today.  5. Disposition: Follow up CBC and basic metabolic panel today. Follow-up echo in approximately 4-6 weeks. Follow up in clinic in approximately 2 months.   Nicolasa Ducking,  NP 03/12/2017, 12:57 PM

## 2017-03-25 ENCOUNTER — Other Ambulatory Visit: Payer: Self-pay | Admitting: Cardiothoracic Surgery

## 2017-03-25 DIAGNOSIS — Z952 Presence of prosthetic heart valve: Secondary | ICD-10-CM

## 2017-03-26 ENCOUNTER — Ambulatory Visit
Admission: RE | Admit: 2017-03-26 | Discharge: 2017-03-26 | Disposition: A | Discharge status: Home / Self Care | Payer: Medicare Other | Source: Ambulatory Visit | Attending: Cardiothoracic Surgery | Admitting: Cardiothoracic Surgery

## 2017-03-26 ENCOUNTER — Other Ambulatory Visit: Payer: Self-pay | Admitting: *Deleted

## 2017-03-26 ENCOUNTER — Ambulatory Visit (INDEPENDENT_AMBULATORY_CARE_PROVIDER_SITE_OTHER): Payer: Self-pay | Admitting: Cardiothoracic Surgery

## 2017-03-26 ENCOUNTER — Encounter: Payer: Self-pay | Admitting: Cardiothoracic Surgery

## 2017-03-26 VITALS — BP 113/66 | Resp 16 | Ht <= 58 in | Wt 129.4 lb

## 2017-03-26 DIAGNOSIS — Z952 Presence of prosthetic heart valve: Secondary | ICD-10-CM

## 2017-03-26 DIAGNOSIS — I350 Nonrheumatic aortic (valve) stenosis: Secondary | ICD-10-CM

## 2017-03-26 DIAGNOSIS — G8918 Other acute postprocedural pain: Secondary | ICD-10-CM

## 2017-03-26 MED ORDER — TRAMADOL HCL 50 MG PO TABS: 50.0000 mg | ORAL_TABLET | Freq: Four times a day (QID) | ORAL | 0 refills | Status: DC | PRN

## 2017-03-26 NOTE — Progress Notes (Signed)
PCP is Corky Downs, MD Referring Provider is Iran Ouch, MD  Chief Complaint  Patient presents with  . Routine Post Op    s/p AVR8/28/18 with a CXR    HPI: Scheduled 1 month postop visit for aortic valve replacement for severe aortic stenosis Patient has diabetes She was discharged home in sinus rhythm and has done well She is scheduled to start cardiac rehabilitation at [facility]  She as no recurrent symptoms of chest pain or shortness of breath. Her lower sternal incision has some superficial breakdown and erythema-cellulitis and will be started on oral Keflex. Chest x-ray today is clear  Past Medical History:  Diagnosis Date  . Anxiety   . Arthritis   . CAD (coronary artery disease)    a. 01/2017 Cath: nonobs dzs.  . Chronic systolic CHF (congestive heart failure) (HCC)    a. TTE 12/2016, EF 45-50%, probable hypokinesis of the mid apical anteroseptal, anterior, & apical myocardium, GR2DD, possibly bicuspid aortic valve that was moderately thickened w/ severely calcified leaflets, severe aortic stenosis w/ mean gradient 47 mmHg, valve area 0.52 cm, mild MR, mildly dilated LA  . Depression   . Diabetes mellitus with complication (HCC)    Tylenol  . Hyperlipidemia   . Hypothyroidism   . IBS (irritable bowel syndrome)   . Iron deficiency anemia    a. s/p pRBC x1 in 12/2016  . Microcytic anemia   . Panic attacks   . Severe aortic stenosis    a. TTE 12/2016: possibly bicuspid aortic valve that was moderately thickened with severely calcified leaflets. There was severe aortic stenosis with a mean gradient of 47 mmHg and valve area of 0.52 cm; b. 01/2017 s/p AVR w/ 19 mm bioprosthetic Magna Ease pericardial tissue valve, ser# [id number].  . Stroke (HCC) 2001  . TIA (transient ischemic attack) 2006    Past Surgical History:  Procedure Laterality Date  . ABDOMINAL HYSTERECTOMY    . AORTIC VALVE REPLACEMENT N/A 02/17/2017   Procedure: AORTIC VALVE REPLACEMENT  (AVR);  Surgeon: Donata Clay, Theron Arista, MD;  Location: [facility] OR;  Service: Open Heart Surgery;  Laterality: N/A;  . CATARACT EXTRACTION W/PHACO Right 04/09/2016   Procedure: CATARACT EXTRACTION PHACO AND INTRAOCULAR LENS PLACEMENT (IOC);  Surgeon: Galen Manila, MD;  Location: [facility] ORS;  Service: Ophthalmology;  Laterality: Right;  [place] 57.2 AP% 18.6 CDE 10.64 Fluid Pack lot # [id number]  . CHOLECYSTECTOMY    . COLONOSCOPY    . CORONARY ANGIOGRAPHY N/A 02/03/2017   Procedure: CORONARY ANGIOGRAPHY;  Surgeon: Iran Ouch, MD;  Location: [facility] INVASIVE CV LAB;  Service: Cardiovascular;  Laterality: N/A;  . FRACTURE SURGERY     LEFT ANKLE  . RIGHT AND LEFT HEART CATH Bilateral 02/03/2017   Procedure: Right and Left Heart Cath;  Surgeon: Iran Ouch, MD;  Location: [facility] INVASIVE CV LAB;  Service: Cardiovascular;  Laterality: Bilateral;  . TEE WITHOUT CARDIOVERSION N/A 02/17/2017   Procedure: TRANSESOPHAGEAL ECHOCARDIOGRAM (TEE);  Surgeon: Donata Clay, Theron Arista, MD;  Location: [facility] OR;  Service: Open Heart Surgery;  Laterality: N/A;    Family History  Problem Relation Age of Onset  . Hypertension Mother     Social History Social History  Substance Use Topics  . Smoking status: Former Smoker    Years: 30.00    Quit date: 02/04/2000  . Smokeless tobacco: Never Used  . Alcohol use No    Current Outpatient Prescriptions  Medication Sig Dispense Refill  . ALPRAZolam (XANAX) 0.5 MG tablet  Take 0.5-1 mg by mouth 2 (two) times daily. Take 0.5 mg in the morning and 1 mg at night.    . hydrOXYzine (ATARAX/VISTARIL) 25 MG tablet Take 50 mg by mouth at bedtime.     Marland Kitchen LANTUS 100 UNIT/ML injection Inject 20 Units into the skin daily.     Marland Kitchen METOPROLOL SUCCINATE PO Take 25 mg by mouth daily. 1/2 tablet daily    . rosuvastatin (CRESTOR) 10 MG tablet Take 1 tablet (10 mg total) by mouth daily. 90 tablet 3  . sertraline (ZOLOFT) 100 MG tablet Take 100 mg by mouth 2 (two) times daily.    . traMADol (ULTRAM) 50 MG  tablet Take 1 tablet (50 mg total) by mouth every 6 (six) hours as needed. 30 tablet 0   No current facility-administered medications for this visit.     Allergies  Allergen Reactions  . Iodine Swelling    ANGIOEDEMA FACIAL SWELLING "BETADINE OKAY"  . Shellfish Allergy Anaphylaxis    Review of Systems  Increase in general strength and activity tolerance She is not smoking No fever  BP 113/66 (BP Location: Right Arm, Patient Position: Sitting, Cuff Size: Large)   Resp 16   Ht 4\' 10"  (1.473 m)   Wt 129 lb 6.4 oz (58.7 kg)   SpO2 98% Comment: ON RA  BMI 27.04 kg/m  Physical Exam     General- alert and comfortable   Lungs- clear without rales, wheezes    Sternal incision-slight eschar with erythema at lower sternal border under bra line.   Cor- regular rate and rhythm, no murmur , gallop   Abdomen- soft, non-tender   Extremities - warm, non-tender, minimal edema   Neuro- oriented, appropriate, no focal weakness   Diagnostic Tests: Chest x-ray clear  Impression: Doing well one month postop aVR Patient may drive do normal daily activities lift up to 15 pounds maximum She'll be started on oral Keflex 3 times a day for mild cellulitis of lower incision in her chest She is ready to start cardiac rehabilitation at her local hospital. She'll start taking aspirin 81 mg daily Plan: Return for wound check in 3 weeks , MD [facility] [phone]

## 2017-04-03 ENCOUNTER — Encounter: Payer: Medicare Other | Attending: Cardiovascular Disease

## 2017-04-03 DIAGNOSIS — Z952 Presence of prosthetic heart valve: Secondary | ICD-10-CM | POA: Insufficient documentation

## 2017-04-03 DIAGNOSIS — Z87891 Personal history of nicotine dependence: Secondary | ICD-10-CM | POA: Insufficient documentation

## 2017-04-08 ENCOUNTER — Ambulatory Visit (INDEPENDENT_AMBULATORY_CARE_PROVIDER_SITE_OTHER): Payer: Medicare Other

## 2017-04-08 ENCOUNTER — Other Ambulatory Visit: Payer: Self-pay

## 2017-04-08 DIAGNOSIS — I350 Nonrheumatic aortic (valve) stenosis: Secondary | ICD-10-CM

## 2017-04-09 ENCOUNTER — Ambulatory Visit (INDEPENDENT_AMBULATORY_CARE_PROVIDER_SITE_OTHER): Payer: Self-pay | Admitting: Cardiothoracic Surgery

## 2017-04-09 ENCOUNTER — Encounter: Payer: Self-pay | Admitting: Cardiothoracic Surgery

## 2017-04-09 VITALS — BP 99/49 | HR 79 | Resp 16 | Ht <= 58 in | Wt 133.0 lb

## 2017-04-09 DIAGNOSIS — I350 Nonrheumatic aortic (valve) stenosis: Secondary | ICD-10-CM

## 2017-04-09 DIAGNOSIS — Z952 Presence of prosthetic heart valve: Secondary | ICD-10-CM

## 2017-04-09 NOTE — Progress Notes (Signed)
PCP is Corky Downs, MD Referring Provider is Iran Ouch, MD  Chief Complaint  Patient presents with  . Routine Post Op    s/p AVR 02/17/17 to recheck sternal incision...had ECHO yesterday    HPI: Final postop visit status post aVR for severe aortic stenosis The patient had a pericardial tissue valve placed. She is feeling much better Her exercise tolerance and energy are significantly improved. Yesterday she had a echocardiogram showing normal LV function and normal functioning of her bioprosthetic valve without AI and minimal gradient. Today her blood pressure is 99 systolic, heart rate 70. She will stop her postop Toprol-XL. The patient had some cellulitis at the lower sternal incision which was treated with antibiotics and has resolved.  Past Medical History:  Diagnosis Date  . Anxiety   . Arthritis   . CAD (coronary artery disease)    a. 01/2017 Cath: nonobs dzs.  . Chronic systolic CHF (congestive heart failure) (HCC)    a. TTE 12/2016, EF 45-50%, probable hypokinesis of the mid apical anteroseptal, anterior, & apical myocardium, GR2DD, possibly bicuspid aortic valve that was moderately thickened w/ severely calcified leaflets, severe aortic stenosis w/ mean gradient 47 mmHg, valve area 0.52 cm, mild MR, mildly dilated LA  . Depression   . Diabetes mellitus with complication (HCC)    Tylenol  . Hyperlipidemia   . Hypothyroidism   . IBS (irritable bowel syndrome)   . Iron deficiency anemia    a. s/p pRBC x1 in 12/2016  . Microcytic anemia   . Panic attacks   . Severe aortic stenosis    a. TTE 12/2016: possibly bicuspid aortic valve that was moderately thickened with severely calcified leaflets. There was severe aortic stenosis with a mean gradient of 47 mmHg and valve area of 0.52 cm; b. 01/2017 s/p AVR w/ 19 mm bioprosthetic Magna Ease pericardial tissue valve, ser# [id number].  . Stroke (HCC) 2001  . TIA (transient ischemic attack) 2006    Past Surgical History:   Procedure Laterality Date  . ABDOMINAL HYSTERECTOMY    . AORTIC VALVE REPLACEMENT N/A 02/17/2017   Procedure: AORTIC VALVE REPLACEMENT (AVR);  Surgeon: Donata Clay, Theron Arista, MD;  Location: [facility] OR;  Service: Open Heart Surgery;  Laterality: N/A;  . CATARACT EXTRACTION W/PHACO Right 04/09/2016   Procedure: CATARACT EXTRACTION PHACO AND INTRAOCULAR LENS PLACEMENT (IOC);  Surgeon: Galen Manila, MD;  Location: [facility] ORS;  Service: Ophthalmology;  Laterality: Right;  [place] 57.2 AP% 18.6 CDE 10.64 Fluid Pack lot # [id number]  . CHOLECYSTECTOMY    . COLONOSCOPY    . CORONARY ANGIOGRAPHY N/A 02/03/2017   Procedure: CORONARY ANGIOGRAPHY;  Surgeon: Iran Ouch, MD;  Location: [facility] INVASIVE CV LAB;  Service: Cardiovascular;  Laterality: N/A;  . FRACTURE SURGERY     LEFT ANKLE  . RIGHT AND LEFT HEART CATH Bilateral 02/03/2017   Procedure: Right and Left Heart Cath;  Surgeon: Iran Ouch, MD;  Location: [facility] INVASIVE CV LAB;  Service: Cardiovascular;  Laterality: Bilateral;  . TEE WITHOUT CARDIOVERSION N/A 02/17/2017   Procedure: TRANSESOPHAGEAL ECHOCARDIOGRAM (TEE);  Surgeon: Donata Clay, Theron Arista, MD;  Location: [facility] OR;  Service: Open Heart Surgery;  Laterality: N/A;    Family History  Problem Relation Age of Onset  . Hypertension Mother     Social History Social History  Substance Use Topics  . Smoking status: Former Smoker    Years: 30.00    Quit date: 02/04/2000  . Smokeless tobacco: Never Used  . Alcohol use No  Current Outpatient Prescriptions  Medication Sig Dispense Refill  . ALPRAZolam (XANAX) 0.5 MG tablet Take 0.5-1 mg by mouth 2 (two) times daily. Take 0.5 mg in the morning and 1 mg at night.    . hydrOXYzine (ATARAX/VISTARIL) 25 MG tablet Take 50 mg by mouth at bedtime.     Marland Kitchen LANTUS 100 UNIT/ML injection Inject 20 Units into the skin daily.     Marland Kitchen METOPROLOL SUCCINATE PO Take 25 mg by mouth daily. 1/2 tablet daily    . rosuvastatin (CRESTOR) 10 MG tablet Take 1 tablet (10 mg  total) by mouth daily. 90 tablet 3  . sertraline (ZOLOFT) 100 MG tablet Take 100 mg by mouth 2 (two) times daily.    . traMADol (ULTRAM) 50 MG tablet Take 1 tablet (50 mg total) by mouth every 6 (six) hours as needed. 30 tablet 0   No current facility-administered medications for this visit.     Allergies  Allergen Reactions  . Iodine Swelling    ANGIOEDEMA FACIAL SWELLING "BETADINE OKAY"  . Shellfish Allergy Anaphylaxis    Review of Systems  Patient overall greatly improved since last visit She is ready to start outpatient rehabilitation at [facility]  BP (!) 99/49 (BP Location: Left Arm, Patient Position: Sitting, Cuff Size: Large)   Pulse 79   Resp 16   Ht 4\' 10"  (1.473 m)   Wt 133 lb (60.3 kg)   SpO2 94% Comment: ON RA  BMI 27.80 kg/m  Physical Exam      Exam    General- alert and comfortable   Lungs- clear without rales, wheezes   Cor- regular rate and rhythm, no murmur , gallop   Abdomen- soft, non-tender   Extremities - warm, non-tender, minimal edema   Neuro- oriented, appropriate, no focal weakness   Diagnostic Tests: Chest x-ray clear Echocardiogram images personally reviewed showing normal prosthetic aVR function, good LV function, sinus rhythm, no pericardial effusion  Impression: Excellent early recovery after aVR   Plan: Stop Toprol XL, continue other meds Ready to start cardiac rehabilitation Do not lift more than 15 pounds until 3 months after surgery. Return as needed  , MD [facility] [phone]

## 2017-04-15 DIAGNOSIS — E119 Type 2 diabetes mellitus without complications: Secondary | ICD-10-CM | POA: Diagnosis not present

## 2017-04-15 DIAGNOSIS — Z23 Encounter for immunization: Secondary | ICD-10-CM | POA: Diagnosis not present

## 2017-04-15 DIAGNOSIS — F411 Generalized anxiety disorder: Secondary | ICD-10-CM | POA: Diagnosis not present

## 2017-04-15 DIAGNOSIS — I10 Essential (primary) hypertension: Secondary | ICD-10-CM | POA: Diagnosis not present

## 2017-04-24 ENCOUNTER — Encounter: Payer: Self-pay | Admitting: *Deleted

## 2017-04-24 ENCOUNTER — Encounter: Payer: Medicare Other | Admitting: *Deleted

## 2017-04-24 VITALS — Ht <= 58 in | Wt 134.0 lb

## 2017-04-24 DIAGNOSIS — Z952 Presence of prosthetic heart valve: Secondary | ICD-10-CM

## 2017-04-24 DIAGNOSIS — Z87891 Personal history of nicotine dependence: Secondary | ICD-10-CM | POA: Diagnosis not present

## 2017-04-24 NOTE — Progress Notes (Signed)
Daily Session Note  Patient Details  Name: Jenny [name] MRN: [id number] Date of Birth: 08/28/1945 Referring Provider:     Cardiac Rehab from 04/24/2017 in [facility] Cardiac [facility] Pulmonary Rehab  Referring Provider  Arida      Encounter Date: 04/24/2017  Check In:     Session Check In - 04/24/17 1352      Check-In   Location [facility]-Cardiac & Pulmonary Rehab   Staff Present Nada Maclachlan, BA, ACSM CEP, Exercise Physiologist;Jett Kulzer Frederico Hamman, RN BSN;Meredith Sherryll Burger, RN BSN   Supervising physician immediately available to respond to emergencies See telemetry face sheet for immediately available ER MD   Medication changes reported     No   Fall or balance concerns reported    Yes   Comments 2-3 falls in past year; 1 with injury and 1 has happened after her surgery.    Tobacco Cessation No Change  quit 2000   Warm-up and Cool-down Performed as group-led instruction   Resistance Training Performed Yes   VAD Patient? No     Pain Assessment   Currently in Pain? No/denies   Multiple Pain Sites No           Exercise Prescription Changes - 04/24/17 1400      Response to Exercise   Blood Pressure (Admit) 112/64   Blood Pressure (Exercise) 120/86   Heart Rate (Admit) 99 bpm   Heart Rate (Exercise) 124 bpm   Heart Rate (Exit) --  patient took off monitor before cool down HR taken   Oxygen Saturation (Admit) 95 %   Oxygen Saturation (Exit) 99 %   Rating of Perceived Exertion (Exercise) 7      History  Smoking Status  . Former Smoker  . Years: 30.00  . Quit date: 02/04/2000  Smokeless Tobacco  . Never Used    Goals Met:  Independence with exercise equipment Exercise tolerated well No report of cardiac concerns or symptoms Strength training completed today  Goals Unmet:  Not Applicable  Comments: Initial med review and 6MW test completed.    Dr. Emily Filbert is Medical Director for [place] and [facility].

## 2017-04-24 NOTE — Progress Notes (Signed)
Cardiac Individual Treatment Plan  Patient Details  Name: Jenny [name] MRN: [id number] Date of Birth: 06-24-1946 Referring Provider:     Cardiac Rehab from 04/24/2017 in [facility] Cardiac and Pulmonary Rehab  Referring Provider  Arida      Initial Encounter Date:    Cardiac Rehab from 04/24/2017 in [facility] Cardiac and Pulmonary Rehab  Date  04/24/17  Referring Provider  Kirke Corin      Visit Diagnosis: S/P TAVR (transcatheter aortic valve replacement)  Patient's Home Medications on Admission:  Current Outpatient Prescriptions:  .  ALPRAZolam (XANAX) 0.5 MG tablet, Take 0.5-1 mg by mouth 2 (two) times daily. Take 0.5 mg in the morning and 1 mg at night., Disp: , Rfl:  .  hydrOXYzine (ATARAX/VISTARIL) 25 MG tablet, Take 50 mg by mouth at bedtime. , Disp: , Rfl:  .  LANTUS 100 UNIT/ML injection, Inject 20 Units into the skin daily. , Disp: , Rfl:  .  rosuvastatin (CRESTOR) 10 MG tablet, Take 1 tablet (10 mg total) by mouth daily., Disp: 90 tablet, Rfl: 3 .  sertraline (ZOLOFT) 100 MG tablet, Take 100 mg by mouth 2 (two) times daily., Disp: , Rfl:  .  METOPROLOL SUCCINATE PO, Take 25 mg by mouth daily. 1/2 tablet daily, Disp: , Rfl:  .  traMADol (ULTRAM) 50 MG tablet, Take 1 tablet (50 mg total) by mouth every 6 (six) hours as needed. (Patient not taking: Reported on 04/24/2017), Disp: 30 tablet, Rfl: 0  Past Medical History: Past Medical History:  Diagnosis Date  . Anxiety   . Arthritis   . CAD (coronary artery disease)    a. 01/2017 Cath: nonobs dzs.  . Chronic systolic CHF (congestive heart failure) (HCC)    a. TTE 12/2016, EF 45-50%, probable hypokinesis of the mid apical anteroseptal, anterior, & apical myocardium, GR2DD, possibly bicuspid aortic valve that was moderately thickened w/ severely calcified leaflets, severe aortic stenosis w/ mean gradient 47 mmHg, valve area 0.52 cm, mild MR, mildly dilated LA  . Depression   . Diabetes mellitus with complication (HCC)    Tylenol  .  Hyperlipidemia   . Hypothyroidism   . IBS (irritable bowel syndrome)   . Iron deficiency anemia    a. s/p pRBC x1 in 12/2016  . Microcytic anemia   . Panic attacks   . Severe aortic stenosis    a. TTE 12/2016: possibly bicuspid aortic valve that was moderately thickened with severely calcified leaflets. There was severe aortic stenosis with a mean gradient of 47 mmHg and valve area of 0.52 cm; b. 01/2017 s/p AVR w/ 19 mm bioprosthetic Magna Ease pericardial tissue valve, ser# [id number].  . Stroke (HCC) 2001  . TIA (transient ischemic attack) 2006    Tobacco Use: History  Smoking Status  . Former Smoker  . Years: 30.00  . Quit date: 02/04/2000  Smokeless Tobacco  . Never Used    Labs: Recent Review Flowsheet Data    Labs for ITP Cardiac and Pulmonary Rehab Latest Ref Rng & Units 02/17/2017 02/18/2017 02/18/2017 02/19/2017 02/20/2017   Cholestrol 0 - 200 mg/dL - - - - -   LDLCALC 0 - 99 mg/dL - - - - -   HDL >41 mg/dL - - - - -   Trlycerides <150 mg/dL - - - - -   Hemoglobin A1c 4.8 - 5.6 % - - - - -   PHART 7.350 - 7.450 7.336(L) - 7.330(L) - -   PCO2ART 32.0 - 48.0 mmHg 43.7 - 38.5 - -  HCO3 20.0 - 28.0 mmol/L 23.2 - 20.3 - -   TCO2 0 - 100 mmol/L 24 21 21 25  -   ACIDBASEDEF 0.0 - 2.0 mmol/L 2.0 - 5.0(H) - -   O2SAT % 97.0 - 93.0 74.8 72.9       Exercise Target Goals: Date: 04/24/17  Exercise Program Goal: Individual exercise prescription set with THRR, safety & activity barriers. Participant demonstrates ability to understand and report RPE using BORG scale, to self-measure pulse accurately, and to acknowledge the importance of the exercise prescription.  Exercise Prescription Goal: Starting with aerobic activity 30 plus minutes a day, 3 days per week for initial exercise prescription. Provide home exercise prescription and guidelines that participant acknowledges understanding prior to discharge.  Activity Barriers & Risk Stratification:     Activity Barriers & Cardiac  Risk Stratification - 04/24/17 1409      Activity Barriers & Cardiac Risk Stratification   Activity Barriers History of Falls;Balance Concerns   Cardiac Risk Stratification Moderate      6 Minute Walk:     6 Minute Walk    Row Name 04/24/17 1445         6 Minute Walk   Distance 1000 feet     Walk Time 6 minutes     # of Rest Breaks 0     MPH 1.9     METS 2.4     RPE 7     Perceived Dyspnea  0     VO2 Peak 8.45     Symptoms No     Resting HR 95 bpm     Resting BP 112/64     Resting Oxygen Saturation  95 %     Exercise Oxygen Saturation  during 6 min walk 99 %     Max Ex. HR 125 bpm     Max Ex. BP 120/56        Oxygen Initial Assessment:   Oxygen Re-Evaluation:   Oxygen Discharge (Final Oxygen Re-Evaluation):   Initial Exercise Prescription:     Initial Exercise Prescription - 04/24/17 1400      Date of Initial Exercise RX and Referring Provider   Date 04/24/17   Referring Provider Arida     Treadmill   MPH 1.7   Grade 0.5   Minutes 15   METs 2.4     Recumbant Bike   Level 1   RPM 60   Watts 8   Minutes 15   METs 2.4     Arm Ergometer   Level 1   RPM 50   Minutes 15   METs 2.4     REL-XR   Level 3   Speed 50   Minutes 15   METs 2.4     Prescription Details   Frequency (times per week) 3   Duration Progress to 45 minutes of aerobic exercise without signs/symptoms of physical distress     Intensity   THRR 40-80% of Max Heartrate 117-140   Ratings of Perceived Exertion 11-15   Perceived Dyspnea 0-4     Resistance Training   Training Prescription Yes   Weight 3 lb   Reps 10-15      Perform Capillary Blood Glucose checks as needed.  Exercise Prescription Changes:     Exercise Prescription Changes    Row Name 04/24/17 1400             Response to Exercise   Blood Pressure (Admit) 112/64       Blood  Pressure (Exercise) 120/86       Heart Rate (Admit) 99 bpm       Heart Rate (Exercise) 124 bpm       Heart Rate (Exit)  -  patient took off monitor before cool down HR taken       Oxygen Saturation (Admit) 95 %       Oxygen Saturation (Exit) 99 %       Rating of Perceived Exertion (Exercise) 7          Exercise Comments:   Exercise Goals and Review:     Exercise Goals    Row Name 04/24/17 1444             Exercise Goals   Increase Physical Activity Yes       Intervention Provide advice, education, support and counseling about physical activity/exercise needs.;Develop an individualized exercise prescription for aerobic and resistive training based on initial evaluation findings, risk stratification, comorbidities and participant's personal goals.       Expected Outcomes Achievement of increased cardiorespiratory fitness and enhanced flexibility, muscular endurance and strength shown through measurements of functional capacity and personal statement of participant.       Increase Strength and Stamina Yes       Intervention Provide advice, education, support and counseling about physical activity/exercise needs.;Develop an individualized exercise prescription for aerobic and resistive training based on initial evaluation findings, risk stratification, comorbidities and participant's personal goals.       Expected Outcomes Achievement of increased cardiorespiratory fitness and enhanced flexibility, muscular endurance and strength shown through measurements of functional capacity and personal statement of participant.       Able to understand and use rate of perceived exertion (RPE) scale Yes       Intervention Provide education and explanation on how to use RPE scale       Expected Outcomes Short Term: Able to use RPE daily in rehab to express subjective intensity level;Long Term:  Able to use RPE to guide intensity level when exercising independently       Able to understand and use Dyspnea scale Yes       Intervention Provide education and explanation on how to use Dyspnea scale       Expected Outcomes  Short Term: Able to use Dyspnea scale daily in rehab to express subjective sense of shortness of breath during exertion;Long Term: Able to use Dyspnea scale to guide intensity level when exercising independently       Knowledge and understanding of Target Heart Rate Range (THRR) Yes       Intervention Provide education and explanation of THRR including how the numbers were predicted and where they are located for reference       Expected Outcomes Short Term: Able to state/look up THRR;Long Term: Able to use THRR to govern intensity when exercising independently;Short Term: Able to use daily as guideline for intensity in rehab       Able to check pulse independently Yes       Intervention Provide education and demonstration on how to check pulse in carotid and radial arteries.;Review the importance of being able to check your own pulse for safety during independent exercise       Expected Outcomes Short Term: Able to explain why pulse checking is important during independent exercise;Long Term: Able to check pulse independently and accurately       Understanding of Exercise Prescription Yes       Intervention Provide education, explanation, and  written materials on patient's individual exercise prescription       Expected Outcomes Short Term: Able to explain program exercise prescription;Long Term: Able to explain home exercise prescription to exercise independently          Exercise Goals Re-Evaluation :   Discharge Exercise Prescription (Final Exercise Prescription Changes):     Exercise Prescription Changes - 04/24/17 1400      Response to Exercise   Blood Pressure (Admit) 112/64   Blood Pressure (Exercise) 120/86   Heart Rate (Admit) 99 bpm   Heart Rate (Exercise) 124 bpm   Heart Rate (Exit) --  patient took off monitor before cool down HR taken   Oxygen Saturation (Admit) 95 %   Oxygen Saturation (Exit) 99 %   Rating of Perceived Exertion (Exercise) 7      Nutrition:  Target  Goals: Understanding of nutrition guidelines, daily intake of sodium 1500mg , cholesterol 200mg , calories 30% from fat and 7% or less from saturated fats, daily to have 5 or more servings of fruits and vegetables.  Biometrics:     Pre Biometrics - 04/24/17 1443      Pre Biometrics   Height 4\' 10"  (1.473 m)   Weight 134 lb (60.8 kg)   Waist Circumference 35.5 inches   Hip Circumference 41.5 inches   Waist to Hip Ratio 0.86 %   BMI (Calculated) 28.01   Single Leg Stand 3.02 seconds       Nutrition Therapy Plan and Nutrition Goals:     Nutrition Therapy & Goals - 04/24/17 1406      Intervention Plan   Intervention Prescribe, educate and counsel regarding individualized specific dietary modifications aiming towards targeted core components such as weight, hypertension, lipid management, diabetes, heart failure and other comorbidities.;Nutrition handout(s) given to patient.   Expected Outcomes Short Term Goal: Understand basic principles of dietary content, such as calories, fat, sodium, cholesterol and nutrients.;Short Term Goal: A plan has been developed with personal nutrition goals set during dietitian appointment.;Long Term Goal: Adherence to prescribed nutrition plan.      Nutrition Discharge: Rate Your Plate Scores:     Nutrition Assessments - 04/24/17 1506      Rate Your Plate Scores   Pre Score --  patient did not complete, to be done on 1st session      Nutrition Goals Re-Evaluation:   Nutrition Goals Discharge (Final Nutrition Goals Re-Evaluation):   Psychosocial: Target Goals: Acknowledge presence or absence of significant depression and/or stress, maximize coping skills, provide positive support system. Participant is able to verbalize types and ability to use techniques and skills needed for reducing stress and depression.   Initial Review & Psychosocial Screening:     Initial Psych Review & Screening - 04/24/17 1407      Initial Review   Current  issues with History of Depression;Current Anxiety/Panic;Current Psychotropic Meds  on xanax and zoloft     Family Dynamics   Good Support System? Yes  family and friends   Comments Patients son is bi-polar and while he is married he has many appointments and is not allowed to drive when he takes certain medication.     Barriers   Psychosocial barriers to participate in program The patient should benefit from training in stress management and relaxation.     Screening Interventions   Interventions Yes;Encouraged to exercise;Program counselor consult;To provide support and resources with identified psychosocial needs;Provide feedback about the scores to participant  Scores reviewed with patient who verbalized understanding.  Expected Outcomes Short Term goal: Utilizing psychosocial counselor, staff and physician to assist with identification of specific Stressors or current issues interfering with healing process. Setting desired goal for each stressor or current issue identified.;Long Term Goal: Stressors or current issues are controlled or eliminated.;Short Term goal: Identification and review with participant of any Quality of Life or Depression concerns found by scoring the questionnaire.;Long Term goal: The participant improves quality of Life and PHQ9 Scores as seen by post scores and/or verbalization of changes      Quality of Life Scores:      Quality of Life - 04/24/17 1409      Quality of Life Scores   Health/Function Pre 29.2 %   Socioeconomic Pre 29 %   Psych/Spiritual Pre 30 %   Family Pre 28.8 %   GLOBAL Pre 29.27 %      PHQ-9: Recent Review Flowsheet Data    Depression screen [facility] 2/9 04/24/2017 03/04/2017 01/29/2017   Decreased Interest 0 0 -   Down, Depressed, Hopeless 0 0 2   PHQ - 2 Score 0 0 2   Altered sleeping 0 - -   Tired, decreased energy 1 - -   Change in appetite 0 - -   Feeling bad or failure about yourself  0 - -   Trouble concentrating 0 - -    Moving slowly or fidgety/restless 0 - -   Suicidal thoughts 0 - 0   PHQ-9 Score 1 - -   Difficult doing work/chores Not difficult at all - Not difficult at all     Interpretation of Total Score  Total Score Depression Severity:  1-4 = Minimal depression, 5-9 = Mild depression, 10-14 = Moderate depression, 15-19 = Moderately severe depression, 20-27 = Severe depression   Psychosocial Evaluation and Intervention:   Psychosocial Re-Evaluation:   Psychosocial Discharge (Final Psychosocial Re-Evaluation):   Vocational Rehabilitation: Provide vocational rehab assistance to qualifying candidates.   Vocational Rehab Evaluation & Intervention:     Vocational Rehab - 04/24/17 1414      Initial Vocational Rehab Evaluation & Intervention   Assessment shows need for Vocational Rehabilitation No   Vocational Rehab Packet given to patient --   Documents faxed to [place] [facility] --      Education: Education Goals: Education classes will be provided on a variety of topics geared toward better understanding of heart health and risk factor modification. Participant will state understanding/return demonstration of topics presented as noted by education test scores.  Learning Barriers/Preferences:     Learning Barriers/Preferences - 04/24/17 1413      Learning Barriers/Preferences   Learning Barriers None   Learning Preferences None      Education Topics: General Nutrition Guidelines/Fats and Fiber: -Group instruction provided by verbal, written material, models and posters to present the general guidelines for heart healthy nutrition. Gives an explanation and review of dietary fats and fiber.   Controlling Sodium/Reading Food Labels: -Group verbal and written material supporting the discussion of sodium use in heart healthy nutrition. Review and explanation with models, verbal and written materials for utilization of the food label.   Exercise Physiology &  Risk Factors: - Group verbal and written instruction with models to review the exercise physiology of the cardiovascular system and associated critical values. Details cardiovascular disease risk factors and the goals associated with each risk factor.   Aerobic Exercise & Resistance Training: - Gives group verbal and written discussion on the health impact of inactivity. On the  components of aerobic and resistive training programs and the benefits of this training and how to safely progress through these programs.   Flexibility, Balance, General Exercise Guidelines: - Provides group verbal and written instruction on the benefits of flexibility and balance training programs. Provides general exercise guidelines with specific guidelines to those with heart or lung disease. Demonstration and skill practice provided.   Stress Management: - Provides group verbal and written instruction about the health risks of elevated stress, cause of high stress, and healthy ways to reduce stress.   Depression: - Provides group verbal and written instruction on the correlation between heart/lung disease and depressed mood, treatment options, and the stigmas associated with seeking treatment.   Anatomy & Physiology of the Heart: - Group verbal and written instruction and models provide basic cardiac anatomy and physiology, with the coronary electrical and arterial systems. Review of: AMI, Angina, Valve disease, Heart Failure, Cardiac Arrhythmia, Pacemakers, and the ICD.   Cardiac Procedures: - Group verbal and written instruction to review commonly prescribed medications for heart disease. Reviews the medication, class of the drug, and side effects. Includes the steps to properly store meds and maintain the prescription regimen. (beta blockers and nitrates)   Cardiac Medications I: - Group verbal and written instruction to review commonly prescribed medications for heart disease. Reviews the medication,  class of the drug, and side effects. Includes the steps to properly store meds and maintain the prescription regimen.   Cardiac Medications II: -Group verbal and written instruction to review commonly prescribed medications for heart disease. Reviews the medication, class of the drug, and side effects. (all other drug classes)    Go Sex-Intimacy & Heart Disease, Get SMART - Goal Setting: - Group verbal and written instruction through game format to discuss heart disease and the return to sexual intimacy. Provides group verbal and written material to discuss and apply goal setting through the application of the S.M.A.R.T. Method.   Other Matters of the Heart: - Provides group verbal, written materials and models to describe Heart Failure, Angina, Valve Disease, Peripheral Artery Disease, and Diabetes in the realm of heart disease. Includes description of the disease process and treatment options available to the cardiac patient.   Exercise & Equipment Safety: - Individual verbal instruction and demonstration of equipment use and safety with use of the equipment.   Cardiac Rehab from 04/24/2017 in [facility] Cardiac and Pulmonary Rehab  Date  04/24/17  Educator  KS  Instruction Review Code  1- Verbalizes Understanding      Infection Prevention: - Provides verbal and written material to individual with discussion of infection control including proper hand washing and proper equipment cleaning during exercise session.   Cardiac Rehab from 04/24/2017 in [facility] Cardiac and Pulmonary Rehab  Date  04/24/17  Educator  KS  Instruction Review Code  1- Verbalizes Understanding      Falls Prevention: - Provides verbal and written material to individual with discussion of falls prevention and safety.   Cardiac Rehab from 04/24/2017 in [facility] Cardiac and Pulmonary Rehab  Date  04/24/17  Educator  KS  Instruction Review Code  1- Verbalizes Understanding      Diabetes: - Individual verbal and  written instruction to review signs/symptoms of diabetes, desired ranges of glucose level fasting, after meals and with exercise. Acknowledge that pre and post exercise glucose checks will be done for 3 sessions at entry of program.   Other: -Provides group and verbal instruction on various topics (see comments)  Knowledge Questionnaire Score:     Knowledge Questionnaire Score - 04/24/17 1413      Knowledge Questionnaire Score   Pre Score 23/28  Correct answers reviewed with patient who verbalized understanding.      Core Components/Risk Factors/Patient Goals at Admission:     Personal Goals and Risk Factors at Admission - 04/24/17 1402      Core Components/Risk Factors/Patient Goals on Admission    Weight Management Yes   Intervention Weight Management: Develop a combined nutrition and exercise program designed to reach desired caloric intake, while maintaining appropriate intake of nutrient and fiber, sodium and fats, and appropriate energy expenditure required for the weight goal.;Weight Management: Provide education and appropriate resources to help participant work on and attain dietary goals.;Weight Management/Obesity: Establish reasonable short term and long term weight goals.   Admit Weight 134 lb (60.8 kg)   Goal Weight: Short Term 128 lb (58.1 kg)   Goal Weight: Long Term 120 lb (54.4 kg)   Expected Outcomes Short Term: Continue to assess and modify interventions until short term weight is achieved;Weight Maintenance: Understanding of the daily nutrition guidelines, which includes 25-35% calories from fat, 7% or less cal from saturated fats, less than 200mg  cholesterol, less than 1.5gm of sodium, & 5 or more servings of fruits and vegetables daily;Weight Loss: Understanding of general recommendations for a balanced deficit meal plan, which promotes 1-2 lb weight loss per week and includes a negative energy balance of [phone] kcal/d;Understanding recommendations for meals to  include 15-35% energy as protein, 25-35% energy from fat, 35-60% energy from carbohydrates, less than 200mg  of dietary cholesterol, 20-35 gm of total fiber daily;Understanding of distribution of calorie intake throughout the day with the consumption of 4-5 meals/snacks   Diabetes Yes   Intervention Provide education about signs/symptoms and action to take for hypo/hyperglycemia.;Provide education about proper nutrition, including hydration, and aerobic/resistive exercise prescription along with prescribed medications to achieve blood glucose in normal ranges: Fasting glucose 65-99 mg/dL   Expected Outcomes Short Term: Participant verbalizes understanding of the signs/symptoms and immediate care of hyper/hypoglycemia, proper foot care and importance of medication, aerobic/resistive exercise and nutrition plan for blood glucose control.;Long Term: Attainment of HbA1C < 7%.   Lipids Yes   Intervention Provide education and support for participant on nutrition & aerobic/resistive exercise along with prescribed medications to achieve LDL 70mg , HDL >40mg .   Expected Outcomes Short Term: Participant states understanding of desired cholesterol values and is compliant with medications prescribed. Participant is following exercise prescription and nutrition guidelines.;Long Term: Cholesterol controlled with medications as prescribed, with individualized exercise RX and with personalized nutrition plan. Value goals: LDL < 70mg , HDL > 40 mg.   Stress Yes   Intervention Offer individual and/or small group education and counseling on adjustment to heart disease, stress management and health-related lifestyle change. Teach and support self-help strategies.;Refer participants experiencing significant psychosocial distress to appropriate mental health specialists for further evaluation and treatment. When possible, include family members and significant others in education/counseling sessions.   Expected Outcomes Short  Term: Participant demonstrates changes in health-related behavior, relaxation and other stress management skills, ability to obtain effective social support, and compliance with psychotropic medications if prescribed.;Long Term: Emotional wellbeing is indicated by absence of clinically significant psychosocial distress or social isolation.      Core Components/Risk Factors/Patient Goals Review:    Core Components/Risk Factors/Patient Goals at Discharge (Final Review):    ITP Comments:     ITP Comments    Row Name 04/24/17  1356           ITP Comments Medical Review Completed; initial ITP created. Diagnosis Documentation can be found in [facility] encounter dated 02/10/2017.          Comments: Initial Med review

## 2017-04-24 NOTE — Patient Instructions (Signed)
Patient Instructions  Patient Details  Name: Jenny [name] MRN: [id number] Date of Birth: October 08, 1945 Referring Provider:  Iran Ouch, MD  Below are the personal goals you chose as well as exercise and nutrition goals. Our goal is to help you keep on track towards obtaining and maintaining your goals. We will be discussing your progress on these goals with you throughout the program.  Initial Exercise Prescription:     Initial Exercise Prescription - 04/24/17 1400      Date of Initial Exercise RX and Referring Provider   Date 04/24/17   Referring Provider Arida     Treadmill   MPH 1.7   Grade 0.5   Minutes 15   METs 2.4     Recumbant Bike   Level 1   RPM 60   Watts 8   Minutes 15   METs 2.4     Arm Ergometer   Level 1   RPM 50   Minutes 15   METs 2.4     REL-XR   Level 3   Speed 50   Minutes 15   METs 2.4     Prescription Details   Frequency (times per week) 3   Duration Progress to 45 minutes of aerobic exercise without signs/symptoms of physical distress     Intensity   THRR 40-80% of Max Heartrate 117-140   Ratings of Perceived Exertion 11-15   Perceived Dyspnea 0-4     Resistance Training   Training Prescription Yes   Weight 3 lb   Reps 10-15      Exercise Goals: Frequency: Be able to perform aerobic exercise three times per week working toward 3-5 days per week.  Intensity: Work with a perceived exertion of 11 (fairly light) - 15 (hard) as tolerated. Follow your new exercise prescription and watch for changes in prescription as you progress with the program. Changes will be reviewed with you when they are made.  Duration: You should be able to do 30 minutes of continuous aerobic exercise in addition to a 5 minute warm-up and a 5 minute cool-down routine.  Nutrition Goals: Your personal nutrition goals will be established when you do your nutrition analysis with the dietician.  The following are nutrition guidelines to  follow: Cholesterol < 200mg /day Sodium < 1500mg /day Fiber: Women over 50 yrs - 21 grams per day  Personal Goals:     Personal Goals and Risk Factors at Admission - 04/24/17 1402      Core Components/Risk Factors/Patient Goals on Admission    Weight Management Yes   Intervention Weight Management: Develop a combined nutrition and exercise program designed to reach desired caloric intake, while maintaining appropriate intake of nutrient and fiber, sodium and fats, and appropriate energy expenditure required for the weight goal.;Weight Management: Provide education and appropriate resources to help participant work on and attain dietary goals.;Weight Management/Obesity: Establish reasonable short term and long term weight goals.   Admit Weight 134 lb (60.8 kg)   Goal Weight: Short Term 128 lb (58.1 kg)   Goal Weight: Long Term 120 lb (54.4 kg)   Expected Outcomes Short Term: Continue to assess and modify interventions until short term weight is achieved;Weight Maintenance: Understanding of the daily nutrition guidelines, which includes 25-35% calories from fat, 7% or less cal from saturated fats, less than 200mg  cholesterol, less than 1.5gm of sodium, & 5 or more servings of fruits and vegetables daily;Weight Loss: Understanding of general recommendations for a balanced deficit meal plan, which promotes 1-2  lb weight loss per week and includes a negative energy balance of [phone] kcal/d;Understanding recommendations for meals to include 15-35% energy as protein, 25-35% energy from fat, 35-60% energy from carbohydrates, less than 200mg  of dietary cholesterol, 20-35 gm of total fiber daily;Understanding of distribution of calorie intake throughout the day with the consumption of 4-5 meals/snacks   Diabetes Yes   Intervention Provide education about signs/symptoms and action to take for hypo/hyperglycemia.;Provide education about proper nutrition, including hydration, and aerobic/resistive exercise  prescription along with prescribed medications to achieve blood glucose in normal ranges: Fasting glucose 65-99 mg/dL   Expected Outcomes Short Term: Participant verbalizes understanding of the signs/symptoms and immediate care of hyper/hypoglycemia, proper foot care and importance of medication, aerobic/resistive exercise and nutrition plan for blood glucose control.;Long Term: Attainment of HbA1C < 7%.   Lipids Yes   Intervention Provide education and support for participant on nutrition & aerobic/resistive exercise along with prescribed medications to achieve LDL 70mg , HDL >40mg .   Expected Outcomes Short Term: Participant states understanding of desired cholesterol values and is compliant with medications prescribed. Participant is following exercise prescription and nutrition guidelines.;Long Term: Cholesterol controlled with medications as prescribed, with individualized exercise RX and with personalized nutrition plan. Value goals: LDL < 70mg , HDL > 40 mg.   Stress Yes   Intervention Offer individual and/or small group education and counseling on adjustment to heart disease, stress management and health-related lifestyle change. Teach and support self-help strategies.;Refer participants experiencing significant psychosocial distress to appropriate mental health specialists for further evaluation and treatment. When possible, include family members and significant others in education/counseling sessions.   Expected Outcomes Short Term: Participant demonstrates changes in health-related behavior, relaxation and other stress management skills, ability to obtain effective social support, and compliance with psychotropic medications if prescribed.;Long Term: Emotional wellbeing is indicated by absence of clinically significant psychosocial distress or social isolation.      Tobacco Use Initial Evaluation: History  Smoking Status  . Former Smoker  . Years: 30.00  . Quit date: 02/04/2000  Smokeless  Tobacco  . Never Used    Exercise Goals and Review:     Exercise Goals    Row Name 04/24/17 1444             Exercise Goals   Increase Physical Activity Yes       Intervention Provide advice, education, support and counseling about physical activity/exercise needs.;Develop an individualized exercise prescription for aerobic and resistive training based on initial evaluation findings, risk stratification, comorbidities and participant's personal goals.       Expected Outcomes Achievement of increased cardiorespiratory fitness and enhanced flexibility, muscular endurance and strength shown through measurements of functional capacity and personal statement of participant.       Increase Strength and Stamina Yes       Intervention Provide advice, education, support and counseling about physical activity/exercise needs.;Develop an individualized exercise prescription for aerobic and resistive training based on initial evaluation findings, risk stratification, comorbidities and participant's personal goals.       Expected Outcomes Achievement of increased cardiorespiratory fitness and enhanced flexibility, muscular endurance and strength shown through measurements of functional capacity and personal statement of participant.       Able to understand and use rate of perceived exertion (RPE) scale Yes       Intervention Provide education and explanation on how to use RPE scale       Expected Outcomes Short Term: Able to use RPE daily in rehab to express subjective  intensity level;Long Term:  Able to use RPE to guide intensity level when exercising independently       Able to understand and use Dyspnea scale Yes       Intervention Provide education and explanation on how to use Dyspnea scale       Expected Outcomes Short Term: Able to use Dyspnea scale daily in rehab to express subjective sense of shortness of breath during exertion;Long Term: Able to use Dyspnea scale to guide intensity level when  exercising independently       Knowledge and understanding of Target Heart Rate Range (THRR) Yes       Intervention Provide education and explanation of THRR including how the numbers were predicted and where they are located for reference       Expected Outcomes Short Term: Able to state/look up THRR;Long Term: Able to use THRR to govern intensity when exercising independently;Short Term: Able to use daily as guideline for intensity in rehab       Able to check pulse independently Yes       Intervention Provide education and demonstration on how to check pulse in carotid and radial arteries.;Review the importance of being able to check your own pulse for safety during independent exercise       Expected Outcomes Short Term: Able to explain why pulse checking is important during independent exercise;Long Term: Able to check pulse independently and accurately       Understanding of Exercise Prescription Yes       Intervention Provide education, explanation, and written materials on patient's individual exercise prescription       Expected Outcomes Short Term: Able to explain program exercise prescription;Long Term: Able to explain home exercise prescription to exercise independently          Copy of goals given to participant.

## 2017-04-28 ENCOUNTER — Telehealth: Payer: Self-pay | Admitting: *Deleted

## 2017-04-28 ENCOUNTER — Encounter: Payer: Self-pay | Admitting: *Deleted

## 2017-04-28 ENCOUNTER — Ambulatory Visit: Payer: Medicare Other

## 2017-04-28 NOTE — Telephone Encounter (Signed)
Jenny [name] called to say she is sorry she will be unable to attend Cardiac Rehab this afternoon since she has a sinus infection.

## 2017-04-30 ENCOUNTER — Ambulatory Visit: Payer: Medicare Other

## 2017-05-01 ENCOUNTER — Ambulatory Visit: Payer: Medicare Other

## 2017-05-05 ENCOUNTER — Ambulatory Visit: Payer: Medicare Other

## 2017-05-07 ENCOUNTER — Ambulatory Visit: Payer: Medicare Other

## 2017-05-07 ENCOUNTER — Encounter: Payer: Self-pay | Admitting: *Deleted

## 2017-05-07 DIAGNOSIS — Z952 Presence of prosthetic heart valve: Secondary | ICD-10-CM

## 2017-05-07 NOTE — Progress Notes (Signed)
Cardiac Individual Treatment Plan  Patient Details  Name: Jenny [name] MRN: [id number] Date of Birth: 06/09/1946 Referring Provider:     Cardiac Rehab from 04/24/2017 in [facility] Cardiac and Pulmonary Rehab  Referring Provider  Arida      Initial Encounter Date:    Cardiac Rehab from 04/24/2017 in [facility] Cardiac and Pulmonary Rehab  Date  04/24/17  Referring Provider  Kirke Corin      Visit Diagnosis: S/P TAVR (transcatheter aortic valve replacement)  Patient's Home Medications on Admission:  Current Outpatient Medications:  .  ALPRAZolam (XANAX) 0.5 MG tablet, Take 0.5-1 mg by mouth 2 (two) times daily. Take 0.5 mg in the morning and 1 mg at night., Disp: , Rfl:  .  hydrOXYzine (ATARAX/VISTARIL) 25 MG tablet, Take 50 mg by mouth at bedtime. , Disp: , Rfl:  .  LANTUS 100 UNIT/ML injection, Inject 20 Units into the skin daily. , Disp: , Rfl:  .  METOPROLOL SUCCINATE PO, Take 25 mg by mouth daily. 1/2 tablet daily, Disp: , Rfl:  .  rosuvastatin (CRESTOR) 10 MG tablet, Take 1 tablet (10 mg total) by mouth daily., Disp: 90 tablet, Rfl: 3 .  sertraline (ZOLOFT) 100 MG tablet, Take 100 mg by mouth 2 (two) times daily., Disp: , Rfl:  .  traMADol (ULTRAM) 50 MG tablet, Take 1 tablet (50 mg total) by mouth every 6 (six) hours as needed. (Patient not taking: Reported on 04/24/2017), Disp: 30 tablet, Rfl: 0  Past Medical History: Past Medical History:  Diagnosis Date  . Anxiety   . Arthritis   . CAD (coronary artery disease)    a. 01/2017 Cath: nonobs dzs.  . Chronic systolic CHF (congestive heart failure) (HCC)    a. TTE 12/2016, EF 45-50%, probable hypokinesis of the mid apical anteroseptal, anterior, & apical myocardium, GR2DD, possibly bicuspid aortic valve that was moderately thickened w/ severely calcified leaflets, severe aortic stenosis w/ mean gradient 47 mmHg, valve area 0.52 cm, mild MR, mildly dilated LA  . Depression   . Diabetes mellitus with complication (HCC)    Tylenol  .  Hyperlipidemia   . Hypothyroidism   . IBS (irritable bowel syndrome)   . Iron deficiency anemia    a. s/p pRBC x1 in 12/2016  . Microcytic anemia   . Panic attacks   . Severe aortic stenosis    a. TTE 12/2016: possibly bicuspid aortic valve that was moderately thickened with severely calcified leaflets. There was severe aortic stenosis with a mean gradient of 47 mmHg and valve area of 0.52 cm; b. 01/2017 s/p AVR w/ 19 mm bioprosthetic Magna Ease pericardial tissue valve, ser# [id number].  . Stroke (HCC) 2001  . TIA (transient ischemic attack) 2006    Tobacco Use: Social History   Tobacco Use  Smoking Status Former Smoker  . Years: 30.00  . Last attempt to quit: 02/04/2000  . Years since quitting: 17.2  Smokeless Tobacco Never Used    Labs: Recent Hydrographic surveyor    Labs for ITP Cardiac and Pulmonary Rehab Latest Ref Rng & Units 02/17/2017 02/18/2017 02/18/2017 02/19/2017 02/20/2017   Cholestrol 0 - 200 mg/dL - - - - -   LDLCALC 0 - 99 mg/dL - - - - -   HDL >44 mg/dL - - - - -   Trlycerides <150 mg/dL - - - - -   Hemoglobin A1c 4.8 - 5.6 % - - - - -   PHART 7.350 - 7.450 7.336(L) - 7.330(L) - -  PCO2ART 32.0 - 48.0 mmHg 43.7 - 38.5 - -   HCO3 20.0 - 28.0 mmol/L 23.2 - 20.3 - -   TCO2 0 - 100 mmol/L 24 21 21 25  -   ACIDBASEDEF 0.0 - 2.0 mmol/L 2.0 - 5.0(H) - -   O2SAT % 97.0 - 93.0 74.8 72.9       Exercise Target Goals:    Exercise Program Goal: Individual exercise prescription set with THRR, safety & activity barriers. Participant demonstrates ability to understand and report RPE using BORG scale, to self-measure pulse accurately, and to acknowledge the importance of the exercise prescription.  Exercise Prescription Goal: Starting with aerobic activity 30 plus minutes a day, 3 days per week for initial exercise prescription. Provide home exercise prescription and guidelines that participant acknowledges understanding prior to discharge.  Activity Barriers & Risk  Stratification: Activity Barriers & Cardiac Risk Stratification - 04/24/17 1409      Activity Barriers & Cardiac Risk Stratification   Activity Barriers  History of Falls;Balance Concerns    Cardiac Risk Stratification  Moderate       6 Minute Walk: 6 Minute Walk    Row Name 04/24/17 1445         6 Minute Walk   Distance  1000 feet     Walk Time  6 minutes     # of Rest Breaks  0     MPH  1.9     METS  2.4     RPE  7     Perceived Dyspnea   0     VO2 Peak  8.45     Symptoms  No     Resting HR  95 bpm     Resting BP  112/64     Resting Oxygen Saturation   95 %     Exercise Oxygen Saturation  during 6 min walk  99 %     Max Ex. HR  125 bpm     Max Ex. BP  120/56        Oxygen Initial Assessment:   Oxygen Re-Evaluation:   Oxygen Discharge (Final Oxygen Re-Evaluation):   Initial Exercise Prescription: Initial Exercise Prescription - 04/24/17 1400      Date of Initial Exercise RX and Referring Provider   Date  04/24/17    Referring Provider  Arida      Treadmill   MPH  1.7    Grade  0.5    Minutes  15    METs  2.4      Recumbant Bike   Level  1    RPM  60    Watts  8    Minutes  15    METs  2.4      Arm Ergometer   Level  1    RPM  50    Minutes  15    METs  2.4      REL-XR   Level  3    Speed  50    Minutes  15    METs  2.4      Prescription Details   Frequency (times per week)  3    Duration  Progress to 45 minutes of aerobic exercise without signs/symptoms of physical distress      Intensity   THRR 40-80% of Max Heartrate  117-140    Ratings of Perceived Exertion  11-15    Perceived Dyspnea  0-4      Resistance Training   Training Prescription  Yes    Weight  3 lb    Reps  10-15       Perform Capillary Blood Glucose checks as needed.  Exercise Prescription Changes: Exercise Prescription Changes    Row Name 04/24/17 1400             Response to Exercise   Blood Pressure (Admit)  112/64       Blood Pressure (Exercise)   120/86       Heart Rate (Admit)  99 bpm       Heart Rate (Exercise)  124 bpm       Heart Rate (Exit)  - patient took off monitor before cool down HR taken       Oxygen Saturation (Admit)  95 %       Oxygen Saturation (Exit)  99 %       Rating of Perceived Exertion (Exercise)  7          Exercise Comments:   Exercise Goals and Review: Exercise Goals    Row Name 04/24/17 1444             Exercise Goals   Increase Physical Activity  Yes       Intervention  Provide advice, education, support and counseling about physical activity/exercise needs.;Develop an individualized exercise prescription for aerobic and resistive training based on initial evaluation findings, risk stratification, comorbidities and participant's personal goals.       Expected Outcomes  Achievement of increased cardiorespiratory fitness and enhanced flexibility, muscular endurance and strength shown through measurements of functional capacity and personal statement of participant.       Increase Strength and Stamina  Yes       Intervention  Provide advice, education, support and counseling about physical activity/exercise needs.;Develop an individualized exercise prescription for aerobic and resistive training based on initial evaluation findings, risk stratification, comorbidities and participant's personal goals.       Expected Outcomes  Achievement of increased cardiorespiratory fitness and enhanced flexibility, muscular endurance and strength shown through measurements of functional capacity and personal statement of participant.       Able to understand and use rate of perceived exertion (RPE) scale  Yes       Intervention  Provide education and explanation on how to use RPE scale       Expected Outcomes  Short Term: Able to use RPE daily in rehab to express subjective intensity level;Long Term:  Able to use RPE to guide intensity level when exercising independently       Able to understand and use Dyspnea scale   Yes       Intervention  Provide education and explanation on how to use Dyspnea scale       Expected Outcomes  Short Term: Able to use Dyspnea scale daily in rehab to express subjective sense of shortness of breath during exertion;Long Term: Able to use Dyspnea scale to guide intensity level when exercising independently       Knowledge and understanding of Target Heart Rate Range (THRR)  Yes       Intervention  Provide education and explanation of THRR including how the numbers were predicted and where they are located for reference       Expected Outcomes  Short Term: Able to state/look up THRR;Long Term: Able to use THRR to govern intensity when exercising independently;Short Term: Able to use daily as guideline for intensity in rehab       Able to check pulse independently  Yes       Intervention  Provide education and demonstration on how to check pulse in carotid and radial arteries.;Review the importance of being able to check your own pulse for safety during independent exercise       Expected Outcomes  Short Term: Able to explain why pulse checking is important during independent exercise;Long Term: Able to check pulse independently and accurately       Understanding of Exercise Prescription  Yes       Intervention  Provide education, explanation, and written materials on patient's individual exercise prescription       Expected Outcomes  Short Term: Able to explain program exercise prescription;Long Term: Able to explain home exercise prescription to exercise independently          Exercise Goals Re-Evaluation :   Discharge Exercise Prescription (Final Exercise Prescription Changes): Exercise Prescription Changes - 04/24/17 1400      Response to Exercise   Blood Pressure (Admit)  112/64    Blood Pressure (Exercise)  120/86    Heart Rate (Admit)  99 bpm    Heart Rate (Exercise)  124 bpm    Heart Rate (Exit)  -- patient took off monitor before cool down HR taken   patient took off  monitor before cool down HR taken   Oxygen Saturation (Admit)  95 %    Oxygen Saturation (Exit)  99 %    Rating of Perceived Exertion (Exercise)  7       Nutrition:  Target Goals: Understanding of nutrition guidelines, daily intake of sodium 1500mg , cholesterol 200mg , calories 30% from fat and 7% or less from saturated fats, daily to have 5 or more servings of fruits and vegetables.  Biometrics: Pre Biometrics - 04/24/17 1443      Pre Biometrics   Height  4\' 10"  (1.473 m)    Weight  134 lb (60.8 kg)    Waist Circumference  35.5 inches    Hip Circumference  41.5 inches    Waist to Hip Ratio  0.86 %    BMI (Calculated)  28.01    Single Leg Stand  3.02 seconds        Nutrition Therapy Plan and Nutrition Goals: Nutrition Therapy & Goals - 04/24/17 1406      Intervention Plan   Intervention  Prescribe, educate and counsel regarding individualized specific dietary modifications aiming towards targeted core components such as weight, hypertension, lipid management, diabetes, heart failure and other comorbidities.;Nutrition handout(s) given to patient.    Expected Outcomes  Short Term Goal: Understand basic principles of dietary content, such as calories, fat, sodium, cholesterol and nutrients.;Short Term Goal: A plan has been developed with personal nutrition goals set during dietitian appointment.;Long Term Goal: Adherence to prescribed nutrition plan.       Nutrition Discharge: Rate Your Plate Scores: Nutrition Assessments - 04/24/17 1506      Rate Your Plate Scores   Pre Score  -- patient did not complete, to be done on 1st session   patient did not complete, to be done on 1st session      Nutrition Goals Re-Evaluation:   Nutrition Goals Discharge (Final Nutrition Goals Re-Evaluation):   Psychosocial: Target Goals: Acknowledge presence or absence of significant depression and/or stress, maximize coping skills, provide positive support system. Participant is able to  verbalize types and ability to use techniques and skills needed for reducing stress and depression.   Initial Review & Psychosocial Screening: Initial Psych Review & Screening - 04/24/17 1407  Initial Review   Current issues with  History of Depression;Current Anxiety/Panic;Current Psychotropic Meds on xanax and zoloft   on xanax and zoloft     Family Dynamics   Good Support System?  Yes family and friends   family and friends   Comments  Patients son is bi-polar and while he is married he has many appointments and is not allowed to drive when he takes certain medication.      Barriers   Psychosocial barriers to participate in program  The patient should benefit from training in stress management and relaxation.      Screening Interventions   Interventions  Yes;Encouraged to exercise;Program counselor consult;To provide support and resources with identified psychosocial needs;Provide feedback about the scores to participant Scores reviewed with patient who verbalized understanding.    Scores reviewed with patient who verbalized understanding.    Expected Outcomes  Short Term goal: Utilizing psychosocial counselor, staff and physician to assist with identification of specific Stressors or current issues interfering with healing process. Setting desired goal for each stressor or current issue identified.;Long Term Goal: Stressors or current issues are controlled or eliminated.;Short Term goal: Identification and review with participant of any Quality of Life or Depression concerns found by scoring the questionnaire.;Long Term goal: The participant improves quality of Life and PHQ9 Scores as seen by post scores and/or verbalization of changes       Quality of Life Scores:  Quality of Life - 04/24/17 1409      Quality of Life Scores   Health/Function Pre  29.2 %    Socioeconomic Pre  29 %    Psych/Spiritual Pre  30 %    Family Pre  28.8 %    GLOBAL Pre  29.27 %        PHQ-9: Recent Review Flowsheet Data    Depression screen [facility] 2/9 04/24/2017 03/04/2017 01/29/2017   Decreased Interest 0 0 -   Down, Depressed, Hopeless 0 0 2   PHQ - 2 Score 0 0 2   Altered sleeping 0 - -   Tired, decreased energy 1 - -   Change in appetite 0 - -   Feeling bad or failure about yourself  0 - -   Trouble concentrating 0 - -   Moving slowly or fidgety/restless 0 - -   Suicidal thoughts 0 - 0   PHQ-9 Score 1 - -   Difficult doing work/chores Not difficult at all - Not difficult at all     Interpretation of Total Score  Total Score Depression Severity:  1-4 = Minimal depression, 5-9 = Mild depression, 10-14 = Moderate depression, 15-19 = Moderately severe depression, 20-27 = Severe depression   Psychosocial Evaluation and Intervention:   Psychosocial Re-Evaluation: Psychosocial Re-Evaluation    Row Name 04/28/17 1453             Psychosocial Re-Evaluation   Comments  Ellanore called to say she is sorry she will be unable to attend Cardiac Rehab this afternoon since she has a sinus infection.          Psychosocial Discharge (Final Psychosocial Re-Evaluation): Psychosocial Re-Evaluation - 04/28/17 1453      Psychosocial Re-Evaluation   Comments  Tayana called to say she is sorry she will be unable to attend Cardiac Rehab this afternoon since she has a sinus infection.       Vocational Rehabilitation: Provide vocational rehab assistance to qualifying candidates.   Vocational Rehab Evaluation & Intervention: Vocational Rehab - 04/24/17 1414  Initial Vocational Rehab Evaluation & Intervention   Assessment shows need for Vocational Rehabilitation  No    Vocational Rehab Packet given to patient  --    Documents faxed to [place] [facility]  --       Education: Education Goals: Education classes will be provided on a variety of topics geared toward better understanding of heart health and risk factor modification. Participant will  state understanding/return demonstration of topics presented as noted by education test scores.  Learning Barriers/Preferences: Learning Barriers/Preferences - 04/24/17 1413      Learning Barriers/Preferences   Learning Barriers  None    Learning Preferences  None       Education Topics: General Nutrition Guidelines/Fats and Fiber: -Group instruction provided by verbal, written material, models and posters to present the general guidelines for heart healthy nutrition. Gives an explanation and review of dietary fats and fiber.   Controlling Sodium/Reading Food Labels: -Group verbal and written material supporting the discussion of sodium use in heart healthy nutrition. Review and explanation with models, verbal and written materials for utilization of the food label.   Exercise Physiology & Risk Factors: - Group verbal and written instruction with models to review the exercise physiology of the cardiovascular system and associated critical values. Details cardiovascular disease risk factors and the goals associated with each risk factor.   Aerobic Exercise & Resistance Training: - Gives group verbal and written discussion on the health impact of inactivity. On the components of aerobic and resistive training programs and the benefits of this training and how to safely progress through these programs.   Flexibility, Balance, General Exercise Guidelines: - Provides group verbal and written instruction on the benefits of flexibility and balance training programs. Provides general exercise guidelines with specific guidelines to those with heart or lung disease. Demonstration and skill practice provided.   Stress Management: - Provides group verbal and written instruction about the health risks of elevated stress, cause of high stress, and healthy ways to reduce stress.   Depression: - Provides group verbal and written instruction on the correlation between heart/lung disease and  depressed mood, treatment options, and the stigmas associated with seeking treatment.   Anatomy & Physiology of the Heart: - Group verbal and written instruction and models provide basic cardiac anatomy and physiology, with the coronary electrical and arterial systems. Review of: AMI, Angina, Valve disease, Heart Failure, Cardiac Arrhythmia, Pacemakers, and the ICD.   Cardiac Procedures: - Group verbal and written instruction to review commonly prescribed medications for heart disease. Reviews the medication, class of the drug, and side effects. Includes the steps to properly store meds and maintain the prescription regimen. (beta blockers and nitrates)   Cardiac Medications I: - Group verbal and written instruction to review commonly prescribed medications for heart disease. Reviews the medication, class of the drug, and side effects. Includes the steps to properly store meds and maintain the prescription regimen.   Cardiac Medications II: -Group verbal and written instruction to review commonly prescribed medications for heart disease. Reviews the medication, class of the drug, and side effects. (all other drug classes)    Go Sex-Intimacy & Heart Disease, Get SMART - Goal Setting: - Group verbal and written instruction through game format to discuss heart disease and the return to sexual intimacy. Provides group verbal and written material to discuss and apply goal setting through the application of the S.M.A.R.T. Method.   Other Matters of the Heart: - Provides group verbal, written materials and models  to describe Heart Failure, Angina, Valve Disease, Peripheral Artery Disease, and Diabetes in the realm of heart disease. Includes description of the disease process and treatment options available to the cardiac patient.   Exercise & Equipment Safety: - Individual verbal instruction and demonstration of equipment use and safety with use of the equipment.   Cardiac Rehab from  04/24/2017 in [facility] Cardiac and Pulmonary Rehab  Date  04/24/17  Educator  [place]  Instruction Review Code  1- Verbalizes Understanding      Infection Prevention: - Provides verbal and written material to individual with discussion of infection control including proper hand washing and proper equipment cleaning during exercise session.   Cardiac Rehab from 04/24/2017 in [facility] Cardiac and Pulmonary Rehab  Date  04/24/17  Educator  KS  Instruction Review Code  1- Verbalizes Understanding      Falls Prevention: - Provides verbal and written material to individual with discussion of falls prevention and safety.   Cardiac Rehab from 04/24/2017 in [facility] Cardiac and Pulmonary Rehab  Date  04/24/17  Educator  KS  Instruction Review Code  1- Verbalizes Understanding      Diabetes: - Individual verbal and written instruction to review signs/symptoms of diabetes, desired ranges of glucose level fasting, after meals and with exercise. Acknowledge that pre and post exercise glucose checks will be done for 3 sessions at entry of program.   Other: -Provides group and verbal instruction on various topics (see comments)    Knowledge Questionnaire Score: Knowledge Questionnaire Score - 04/24/17 1413      Knowledge Questionnaire Score   Pre Score  23/28 Correct answers reviewed with patient who verbalized understanding.   Correct answers reviewed with patient who verbalized understanding.      Core Components/Risk Factors/Patient Goals at Admission: Personal Goals and Risk Factors at Admission - 04/24/17 1402      Core Components/Risk Factors/Patient Goals on Admission    Weight Management  Yes    Intervention  Weight Management: Develop a combined nutrition and exercise program designed to reach desired caloric intake, while maintaining appropriate intake of nutrient and fiber, sodium and fats, and appropriate energy expenditure required for the weight goal.;Weight Management: Provide  education and appropriate resources to help participant work on and attain dietary goals.;Weight Management/Obesity: Establish reasonable short term and long term weight goals.    Admit Weight  134 lb (60.8 kg)    Goal Weight: Short Term  128 lb (58.1 kg)    Goal Weight: Long Term  120 lb (54.4 kg)    Expected Outcomes  Short Term: Continue to assess and modify interventions until short term weight is achieved;Weight Maintenance: Understanding of the daily nutrition guidelines, which includes 25-35% calories from fat, 7% or less cal from saturated fats, less than 200mg  cholesterol, less than 1.5gm of sodium, & 5 or more servings of fruits and vegetables daily;Weight Loss: Understanding of general recommendations for a balanced deficit meal plan, which promotes 1-2 lb weight loss per week and includes a negative energy balance of [phone] kcal/d;Understanding recommendations for meals to include 15-35% energy as protein, 25-35% energy from fat, 35-60% energy from carbohydrates, less than 200mg  of dietary cholesterol, 20-35 gm of total fiber daily;Understanding of distribution of calorie intake throughout the day with the consumption of 4-5 meals/snacks    Diabetes  Yes    Intervention  Provide education about signs/symptoms and action to take for hypo/hyperglycemia.;Provide education about proper nutrition, including hydration, and aerobic/resistive exercise prescription along with prescribed medications to  achieve blood glucose in normal ranges: Fasting glucose 65-99 mg/dL    Expected Outcomes  Short Term: Participant verbalizes understanding of the signs/symptoms and immediate care of hyper/hypoglycemia, proper foot care and importance of medication, aerobic/resistive exercise and nutrition plan for blood glucose control.;Long Term: Attainment of HbA1C < 7%.    Lipids  Yes    Intervention  Provide education and support for participant on nutrition & aerobic/resistive exercise along with prescribed  medications to achieve LDL 70mg , HDL >40mg .    Expected Outcomes  Short Term: Participant states understanding of desired cholesterol values and is compliant with medications prescribed. Participant is following exercise prescription and nutrition guidelines.;Long Term: Cholesterol controlled with medications as prescribed, with individualized exercise RX and with personalized nutrition plan. Value goals: LDL < 70mg , HDL > 40 mg.    Stress  Yes    Intervention  Offer individual and/or small group education and counseling on adjustment to heart disease, stress management and health-related lifestyle change. Teach and support self-help strategies.;Refer participants experiencing significant psychosocial distress to appropriate mental health specialists for further evaluation and treatment. When possible, include family members and significant others in education/counseling sessions.    Expected Outcomes  Short Term: Participant demonstrates changes in health-related behavior, relaxation and other stress management skills, ability to obtain effective social support, and compliance with psychotropic medications if prescribed.;Long Term: Emotional wellbeing is indicated by absence of clinically significant psychosocial distress or social isolation.       Core Components/Risk Factors/Patient Goals Review:    Core Components/Risk Factors/Patient Goals at Discharge (Final Review):    ITP Comments: ITP Comments    Row Name 04/24/17 1356 04/28/17 1452 05/07/17 0609       ITP Comments  Medical Review Completed; initial ITP created. Diagnosis Documentation can be found in [facility] encounter dated 02/10/2017.  Shauni called to say she is sorry she will be unable to attend Cardiac Rehab this afternoon since she has a sinus infection.   30 day review. Continue with ITP unless directed changes per Medical Director review. Med review completed not other sessions        Comments:

## 2017-05-08 ENCOUNTER — Ambulatory Visit: Payer: Medicare Other

## 2017-05-12 ENCOUNTER — Ambulatory Visit: Payer: Medicare Other

## 2017-05-13 ENCOUNTER — Ambulatory Visit: Payer: 59 | Admitting: Cardiovascular Disease

## 2017-05-14 ENCOUNTER — Encounter: Payer: Self-pay | Admitting: *Deleted

## 2017-05-14 ENCOUNTER — Ambulatory Visit: Payer: Medicare Other

## 2017-05-14 ENCOUNTER — Telehealth: Payer: Self-pay | Admitting: *Deleted

## 2017-05-14 NOTE — Progress Notes (Signed)
Cardiac Individual Treatment Plan  Patient Details  Name: Jenny [name] MRN: [id number] Date of Birth: September 14, 1945 Referring Provider:     Cardiac Rehab from 04/24/2017 in [facility] Cardiac and Pulmonary Rehab  Referring Provider  Arida      Initial Encounter Date:    Cardiac Rehab from 04/24/2017 in [facility] Cardiac and Pulmonary Rehab  Date  04/24/17  Referring Provider  Kirke Corin      Visit Diagnosis: No diagnosis found.  Patient's Home Medications on Admission:  Current Outpatient Medications:  .  ALPRAZolam (XANAX) 0.5 MG tablet, Take 0.5-1 mg by mouth 2 (two) times daily. Take 0.5 mg in the morning and 1 mg at night., Disp: , Rfl:  .  hydrOXYzine (ATARAX/VISTARIL) 25 MG tablet, Take 50 mg by mouth at bedtime. , Disp: , Rfl:  .  LANTUS 100 UNIT/ML injection, Inject 20 Units into the skin daily. , Disp: , Rfl:  .  METOPROLOL SUCCINATE PO, Take 25 mg by mouth daily. 1/2 tablet daily, Disp: , Rfl:  .  rosuvastatin (CRESTOR) 10 MG tablet, Take 1 tablet (10 mg total) by mouth daily., Disp: 90 tablet, Rfl: 3 .  sertraline (ZOLOFT) 100 MG tablet, Take 100 mg by mouth 2 (two) times daily., Disp: , Rfl:  .  traMADol (ULTRAM) 50 MG tablet, Take 1 tablet (50 mg total) by mouth every 6 (six) hours as needed. (Patient not taking: Reported on 04/24/2017), Disp: 30 tablet, Rfl: 0  Past Medical History: Past Medical History:  Diagnosis Date  . Anxiety   . Arthritis   . CAD (coronary artery disease)    a. 01/2017 Cath: nonobs dzs.  . Chronic systolic CHF (congestive heart failure) (HCC)    a. TTE 12/2016, EF 45-50%, probable hypokinesis of the mid apical anteroseptal, anterior, & apical myocardium, GR2DD, possibly bicuspid aortic valve that was moderately thickened w/ severely calcified leaflets, severe aortic stenosis w/ mean gradient 47 mmHg, valve area 0.52 cm, mild MR, mildly dilated LA  . Depression   . Diabetes mellitus with complication (HCC)    Tylenol  . Hyperlipidemia   . Hypothyroidism    . IBS (irritable bowel syndrome)   . Iron deficiency anemia    a. s/p pRBC x1 in 12/2016  . Microcytic anemia   . Panic attacks   . Severe aortic stenosis    a. TTE 12/2016: possibly bicuspid aortic valve that was moderately thickened with severely calcified leaflets. There was severe aortic stenosis with a mean gradient of 47 mmHg and valve area of 0.52 cm; b. 01/2017 s/p AVR w/ 19 mm bioprosthetic Magna Ease pericardial tissue valve, ser# [id number].  . Stroke (HCC) 2001  . TIA (transient ischemic attack) 2006    Tobacco Use: Social History   Tobacco Use  Smoking Status Former Smoker  . Years: 30.00  . Last attempt to quit: 02/04/2000  . Years since quitting: 17.2  Smokeless Tobacco Never Used    Labs: Recent Hydrographic surveyor    Labs for ITP Cardiac and Pulmonary Rehab Latest Ref Rng & Units 02/17/2017 02/18/2017 02/18/2017 02/19/2017 02/20/2017   Cholestrol 0 - 200 mg/dL - - - - -   LDLCALC 0 - 99 mg/dL - - - - -   HDL >13 mg/dL - - - - -   Trlycerides <150 mg/dL - - - - -   Hemoglobin A1c 4.8 - 5.6 % - - - - -   PHART 7.350 - 7.450 7.336(L) - 7.330(L) - -   PCO2ART 32.0 -  48.0 mmHg 43.7 - 38.5 - -   HCO3 20.0 - 28.0 mmol/L 23.2 - 20.3 - -   TCO2 0 - 100 mmol/L 24 21 21 25  -   ACIDBASEDEF 0.0 - 2.0 mmol/L 2.0 - 5.0(H) - -   O2SAT % 97.0 - 93.0 74.8 72.9       Exercise Target Goals:    Exercise Program Goal: Individual exercise prescription set with THRR, safety & activity barriers. Participant demonstrates ability to understand and report RPE using BORG scale, to self-measure pulse accurately, and to acknowledge the importance of the exercise prescription.  Exercise Prescription Goal: Starting with aerobic activity 30 plus minutes a day, 3 days per week for initial exercise prescription. Provide home exercise prescription and guidelines that participant acknowledges understanding prior to discharge.  Activity Barriers & Risk Stratification: Activity Barriers & Cardiac  Risk Stratification - 04/24/17 1409      Activity Barriers & Cardiac Risk Stratification   Activity Barriers  History of Falls;Balance Concerns    Cardiac Risk Stratification  Moderate       6 Minute Walk: 6 Minute Walk    Row Name 04/24/17 1445         6 Minute Walk   Distance  1000 feet     Walk Time  6 minutes     # of Rest Breaks  0     MPH  1.9     METS  2.4     RPE  7     Perceived Dyspnea   0     VO2 Peak  8.45     Symptoms  No     Resting HR  95 bpm     Resting BP  112/64     Resting Oxygen Saturation   95 %     Exercise Oxygen Saturation  during 6 min walk  99 %     Max Ex. HR  125 bpm     Max Ex. BP  120/56        Oxygen Initial Assessment:   Oxygen Re-Evaluation:   Oxygen Discharge (Final Oxygen Re-Evaluation):   Initial Exercise Prescription: Initial Exercise Prescription - 04/24/17 1400      Date of Initial Exercise RX and Referring Provider   Date  04/24/17    Referring Provider  Arida      Treadmill   MPH  1.7    Grade  0.5    Minutes  15    METs  2.4      Recumbant Bike   Level  1    RPM  60    Watts  8    Minutes  15    METs  2.4      Arm Ergometer   Level  1    RPM  50    Minutes  15    METs  2.4      REL-XR   Level  3    Speed  50    Minutes  15    METs  2.4      Prescription Details   Frequency (times per week)  3    Duration  Progress to 45 minutes of aerobic exercise without signs/symptoms of physical distress      Intensity   THRR 40-80% of Max Heartrate  117-140    Ratings of Perceived Exertion  11-15    Perceived Dyspnea  0-4      Resistance Training   Training Prescription  Yes  Weight  3 lb    Reps  10-15       Perform Capillary Blood Glucose checks as needed.  Exercise Prescription Changes: Exercise Prescription Changes    Row Name 04/24/17 1400             Response to Exercise   Blood Pressure (Admit)  112/64       Blood Pressure (Exercise)  120/86       Heart Rate (Admit)  99 bpm        Heart Rate (Exercise)  124 bpm       Heart Rate (Exit)  - patient took off monitor before cool down HR taken       Oxygen Saturation (Admit)  95 %       Oxygen Saturation (Exit)  99 %       Rating of Perceived Exertion (Exercise)  7          Exercise Comments:   Exercise Goals and Review: Exercise Goals    Row Name 04/24/17 1444             Exercise Goals   Increase Physical Activity  Yes       Intervention  Provide advice, education, support and counseling about physical activity/exercise needs.;Develop an individualized exercise prescription for aerobic and resistive training based on initial evaluation findings, risk stratification, comorbidities and participant's personal goals.       Expected Outcomes  Achievement of increased cardiorespiratory fitness and enhanced flexibility, muscular endurance and strength shown through measurements of functional capacity and personal statement of participant.       Increase Strength and Stamina  Yes       Intervention  Provide advice, education, support and counseling about physical activity/exercise needs.;Develop an individualized exercise prescription for aerobic and resistive training based on initial evaluation findings, risk stratification, comorbidities and participant's personal goals.       Expected Outcomes  Achievement of increased cardiorespiratory fitness and enhanced flexibility, muscular endurance and strength shown through measurements of functional capacity and personal statement of participant.       Able to understand and use rate of perceived exertion (RPE) scale  Yes       Intervention  Provide education and explanation on how to use RPE scale       Expected Outcomes  Short Term: Able to use RPE daily in rehab to express subjective intensity level;Long Term:  Able to use RPE to guide intensity level when exercising independently       Able to understand and use Dyspnea scale  Yes       Intervention  Provide education and  explanation on how to use Dyspnea scale       Expected Outcomes  Short Term: Able to use Dyspnea scale daily in rehab to express subjective sense of shortness of breath during exertion;Long Term: Able to use Dyspnea scale to guide intensity level when exercising independently       Knowledge and understanding of Target Heart Rate Range (THRR)  Yes       Intervention  Provide education and explanation of THRR including how the numbers were predicted and where they are located for reference       Expected Outcomes  Short Term: Able to state/look up THRR;Long Term: Able to use THRR to govern intensity when exercising independently;Short Term: Able to use daily as guideline for intensity in rehab       Able to check pulse independently  Yes  Intervention  Provide education and demonstration on how to check pulse in carotid and radial arteries.;Review the importance of being able to check your own pulse for safety during independent exercise       Expected Outcomes  Short Term: Able to explain why pulse checking is important during independent exercise;Long Term: Able to check pulse independently and accurately       Understanding of Exercise Prescription  Yes       Intervention  Provide education, explanation, and written materials on patient's individual exercise prescription       Expected Outcomes  Short Term: Able to explain program exercise prescription;Long Term: Able to explain home exercise prescription to exercise independently          Exercise Goals Re-Evaluation :   Discharge Exercise Prescription (Final Exercise Prescription Changes): Exercise Prescription Changes - 04/24/17 1400      Response to Exercise   Blood Pressure (Admit)  112/64    Blood Pressure (Exercise)  120/86    Heart Rate (Admit)  99 bpm    Heart Rate (Exercise)  124 bpm    Heart Rate (Exit)  -- patient took off monitor before cool down HR taken    Oxygen Saturation (Admit)  95 %    Oxygen Saturation (Exit)   99 %    Rating of Perceived Exertion (Exercise)  7       Nutrition:  Target Goals: Understanding of nutrition guidelines, daily intake of sodium 1500mg , cholesterol 200mg , calories 30% from fat and 7% or less from saturated fats, daily to have 5 or more servings of fruits and vegetables.  Biometrics: Pre Biometrics - 04/24/17 1443      Pre Biometrics   Height  4\' 10"  (1.473 m)    Weight  134 lb (60.8 kg)    Waist Circumference  35.5 inches    Hip Circumference  41.5 inches    Waist to Hip Ratio  0.86 %    BMI (Calculated)  28.01    Single Leg Stand  3.02 seconds        Nutrition Therapy Plan and Nutrition Goals: Nutrition Therapy & Goals - 04/24/17 1406      Intervention Plan   Intervention  Prescribe, educate and counsel regarding individualized specific dietary modifications aiming towards targeted core components such as weight, hypertension, lipid management, diabetes, heart failure and other comorbidities.;Nutrition handout(s) given to patient.    Expected Outcomes  Short Term Goal: Understand basic principles of dietary content, such as calories, fat, sodium, cholesterol and nutrients.;Short Term Goal: A plan has been developed with personal nutrition goals set during dietitian appointment.;Long Term Goal: Adherence to prescribed nutrition plan.       Nutrition Discharge: Rate Your Plate Scores: Nutrition Assessments - 04/24/17 1506      Rate Your Plate Scores   Pre Score  -- patient did not complete, to be done on 1st session       Nutrition Goals Re-Evaluation:   Nutrition Goals Discharge (Final Nutrition Goals Re-Evaluation):   Psychosocial: Target Goals: Acknowledge presence or absence of significant depression and/or stress, maximize coping skills, provide positive support system. Participant is able to verbalize types and ability to use techniques and skills needed for reducing stress and depression.   Initial Review & Psychosocial Screening: Initial  Psych Review & Screening - 04/24/17 1407      Initial Review   Current issues with  History of Depression;Current Anxiety/Panic;Current Psychotropic Meds on xanax and zoloft  Family Dynamics   Good Support System?  Yes family and friends    Comments  Patients son is bi-polar and while he is married he has many appointments and is not allowed to drive when he takes certain medication.      Barriers   Psychosocial barriers to participate in program  The patient should benefit from training in stress management and relaxation.      Screening Interventions   Interventions  Yes;Encouraged to exercise;Program counselor consult;To provide support and resources with identified psychosocial needs;Provide feedback about the scores to participant Scores reviewed with patient who verbalized understanding.     Expected Outcomes  Short Term goal: Utilizing psychosocial counselor, staff and physician to assist with identification of specific Stressors or current issues interfering with healing process. Setting desired goal for each stressor or current issue identified.;Long Term Goal: Stressors or current issues are controlled or eliminated.;Short Term goal: Identification and review with participant of any Quality of Life or Depression concerns found by scoring the questionnaire.;Long Term goal: The participant improves quality of Life and PHQ9 Scores as seen by post scores and/or verbalization of changes       Quality of Life Scores:  Quality of Life - 04/24/17 1409      Quality of Life Scores   Health/Function Pre  29.2 %    Socioeconomic Pre  29 %    Psych/Spiritual Pre  30 %    Family Pre  28.8 %    GLOBAL Pre  29.27 %       PHQ-9: Recent Review Flowsheet Data    Depression screen [facility] 04/24/2017 03/04/2017 01/29/2017   Decreased Interest 0 0 -   Down, Depressed, Hopeless 0 0 2   PHQ - 2 Score 0 0 2   Altered sleeping 0 - -   Tired, decreased energy 1 - -   Change in appetite 0 - -    Feeling bad or failure about yourself  0 - -   Trouble concentrating 0 - -   Moving slowly or fidgety/restless 0 - -   Suicidal thoughts 0 - 0   PHQ-9 Score 1 - -   Difficult doing work/chores Not difficult at all - Not difficult at all     Interpretation of Total Score  Total Score Depression Severity:  1-4 = Minimal depression, 5-9 = Mild depression, 10-14 = Moderate depression, 15-19 = Moderately severe depression, 20-27 = Severe depression   Psychosocial Evaluation and Intervention:   Psychosocial Re-Evaluation: Psychosocial Re-Evaluation    Row Name 04/28/17 1453             Psychosocial Re-Evaluation   Comments  Jenny [name] called to say she is sorry she will be unable to attend Cardiac Rehab this afternoon since she has a sinus infection.          Psychosocial Discharge (Final Psychosocial Re-Evaluation): Psychosocial Re-Evaluation - 04/28/17 1453      Psychosocial Re-Evaluation   Comments  Jenny [name] called to say she is sorry she will be unable to attend Cardiac Rehab this afternoon since she has a sinus infection.       Vocational Rehabilitation: Provide vocational rehab assistance to qualifying candidates.   Vocational Rehab Evaluation & Intervention: Vocational Rehab - 04/24/17 1414      Initial Vocational Rehab Evaluation & Intervention   Assessment shows need for Vocational Rehabilitation  No    Vocational Rehab Packet given to patient  --    [facility]  --       Education: Education Goals: Education classes will be provided on a variety of topics geared toward better understanding of heart health and risk factor modification. Participant will state understanding/return demonstration of topics presented as noted by education test scores.  Learning Barriers/Preferences: Learning Barriers/Preferences - 04/24/17 1413      Learning Barriers/Preferences   Learning Barriers  None    Learning Preferences  None        Education Topics: General Nutrition Guidelines/Fats and Fiber: -Group instruction provided by verbal, written material, models and posters to present the general guidelines for heart healthy nutrition. Gives an explanation and review of dietary fats and fiber.   Controlling Sodium/Reading Food Labels: -Group verbal and written material supporting the discussion of sodium use in heart healthy nutrition. Review and explanation with models, verbal and written materials for utilization of the food label.   Exercise Physiology & Risk Factors: - Group verbal and written instruction with models to review the exercise physiology of the cardiovascular system and associated critical values. Details cardiovascular disease risk factors and the goals associated with each risk factor.   Aerobic Exercise & Resistance Training: - Gives group verbal and written discussion on the health impact of inactivity. On the components of aerobic and resistive training programs and the benefits of this training and how to safely progress through these programs.   Flexibility, Balance, General Exercise Guidelines: - Provides group verbal and written instruction on the benefits of flexibility and balance training programs. Provides general exercise guidelines with specific guidelines to those with heart or lung disease. Demonstration and skill practice provided.   Stress Management: - Provides group verbal and written instruction about the health risks of elevated stress, cause of high stress, and healthy ways to reduce stress.   Depression: - Provides group verbal and written instruction on the correlation between heart/lung disease and depressed mood, treatment options, and the stigmas associated with seeking treatment.   Anatomy & Physiology of the Heart: - Group verbal and written instruction and models provide basic cardiac anatomy and physiology, with the coronary electrical and arterial systems. Review  of: AMI, Angina, Valve disease, Heart Failure, Cardiac Arrhythmia, Pacemakers, and the ICD.   Cardiac Procedures: - Group verbal and written instruction to review commonly prescribed medications for heart disease. Reviews the medication, class of the drug, and side effects. Includes the steps to properly store meds and maintain the prescription regimen. (beta blockers and nitrates)   Cardiac Medications I: - Group verbal and written instruction to review commonly prescribed medications for heart disease. Reviews the medication, class of the drug, and side effects. Includes the steps to properly store meds and maintain the prescription regimen.   Cardiac Medications II: -Group verbal and written instruction to review commonly prescribed medications for heart disease. Reviews the medication, class of the drug, and side effects. (all other drug classes)    Go Sex-Intimacy & Heart Disease, Get SMART - Goal Setting: - Group verbal and written instruction through game format to discuss heart disease and the return to sexual intimacy. Provides group verbal and written material to discuss and apply goal setting through the application of the S.M.A.R.T. Method.   Other Matters of the Heart: - Provides group verbal, written materials and models to describe Heart Failure, Angina, Valve Disease, Peripheral Artery Disease, and Diabetes in the realm of heart disease. Includes description of the disease process and treatment options available to the cardiac patient.   Exercise &  Equipment Safety: - Individual verbal instruction and demonstration of equipment use and safety with use of the equipment.   Cardiac Rehab from 04/24/2017 in [facility] Cardiac and Pulmonary Rehab  Date  04/24/17  Educator  [place]  Instruction Review Code  1- Verbalizes Understanding      Infection Prevention: - Provides verbal and written material to individual with discussion of infection control including proper hand washing and  proper equipment cleaning during exercise session.   Cardiac Rehab from 04/24/2017 in [facility] Cardiac and Pulmonary Rehab  Date  04/24/17  Educator  [place]  Instruction Review Code  1- Verbalizes Understanding      Falls Prevention: - Provides verbal and written material to individual with discussion of falls prevention and safety.   Cardiac Rehab from 04/24/2017 in [facility] Cardiac and Pulmonary Rehab  Date  04/24/17  Educator  KS  Instruction Review Code  1- Verbalizes Understanding      Diabetes: - Individual verbal and written instruction to review signs/symptoms of diabetes, desired ranges of glucose level fasting, after meals and with exercise. Acknowledge that pre and post exercise glucose checks will be done for 3 sessions at entry of program.   Other: -Provides group and verbal instruction on various topics (see comments)    Knowledge Questionnaire Score: Knowledge Questionnaire Score - 04/24/17 1413      Knowledge Questionnaire Score   Pre Score  23/28 Correct answers reviewed with patient who verbalized understanding.       Core Components/Risk Factors/Patient Goals at Admission: Personal Goals and Risk Factors at Admission - 04/24/17 1402      Core Components/Risk Factors/Patient Goals on Admission    Weight Management  Yes    Intervention  Weight Management: Develop a combined nutrition and exercise program designed to reach desired caloric intake, while maintaining appropriate intake of nutrient and fiber, sodium and fats, and appropriate energy expenditure required for the weight goal.;Weight Management: Provide education and appropriate resources to help participant work on and attain dietary goals.;Weight Management/Obesity: Establish reasonable short term and long term weight goals.    Admit Weight  134 lb (60.8 kg)    Goal Weight: Short Term  128 lb (58.1 kg)    Goal Weight: Long Term  120 lb (54.4 kg)    Expected Outcomes  Short Term: Continue to assess and modify  interventions until short term weight is achieved;Weight Maintenance: Understanding of the daily nutrition guidelines, which includes 25-35% calories from fat, 7% or less cal from saturated fats, less than 200mg  cholesterol, less than 1.5gm of sodium, & 5 or more servings of fruits and vegetables daily;Weight Loss: Understanding of general recommendations for a balanced deficit meal plan, which promotes 1-2 lb weight loss per week and includes a negative energy balance of [phone] kcal/d;Understanding recommendations for meals to include 15-35% energy as protein, 25-35% energy from fat, 35-60% energy from carbohydrates, less than 200mg  of dietary cholesterol, 20-35 gm of total fiber daily;Understanding of distribution of calorie intake throughout the day with the consumption of 4-5 meals/snacks    Diabetes  Yes    Intervention  Provide education about signs/symptoms and action to take for hypo/hyperglycemia.;Provide education about proper nutrition, including hydration, and aerobic/resistive exercise prescription along with prescribed medications to achieve blood glucose in normal ranges: Fasting glucose 65-99 mg/dL    Expected Outcomes  Short Term: Participant verbalizes understanding of the signs/symptoms and immediate care of hyper/hypoglycemia, proper foot care and importance of medication, aerobic/resistive exercise and nutrition plan for blood glucose control.;Long  Term: Attainment of HbA1C < 7%.    Lipids  Yes    Intervention  Provide education and support for participant on nutrition & aerobic/resistive exercise along with prescribed medications to achieve LDL 70mg , HDL >40mg .    Expected Outcomes  Short Term: Participant states understanding of desired cholesterol values and is compliant with medications prescribed. Participant is following exercise prescription and nutrition guidelines.;Long Term: Cholesterol controlled with medications as prescribed, with individualized exercise RX and with  personalized nutrition plan. Value goals: LDL < 70mg , HDL > 40 mg.    Stress  Yes    Intervention  Offer individual and/or small group education and counseling on adjustment to heart disease, stress management and health-related lifestyle change. Teach and support self-help strategies.;Refer participants experiencing significant psychosocial distress to appropriate mental health specialists for further evaluation and treatment. When possible, include family members and significant others in education/counseling sessions.    Expected Outcomes  Short Term: Participant demonstrates changes in health-related behavior, relaxation and other stress management skills, ability to obtain effective social support, and compliance with psychotropic medications if prescribed.;Long Term: Emotional wellbeing is indicated by absence of clinically significant psychosocial distress or social isolation.       Core Components/Risk Factors/Patient Goals Review:    Core Components/Risk Factors/Patient Goals at Discharge (Final Review):    ITP Comments: ITP Comments    Row Name 04/24/17 1356 04/28/17 1452 05/07/17 0609 05/14/17 1440     ITP Comments  Medical Review Completed; initial ITP created. Diagnosis Documentation can be found in [facility] encounter dated 02/10/2017.  Jenny [name] called to say she is sorry she will be unable to attend Cardiac Rehab this afternoon since she has a sinus infection.   30 day review. Continue with ITP unless directed changes per Medical Director review. Med review completed not other sessions  I called [place] and asked her if she was planning on attending [facility] and she said "I like the people there but I don't see myself doing that right now. I am going out of town to visit my brother who I have not seen for a year".        Comments: I called Jenny [name] and asked her if she was planning on attending [facility] and she said "I like the people there but I don't see myself doing that  right now. I am going out of town to visit my brother who I have not seen for a year".

## 2017-05-14 NOTE — Telephone Encounter (Signed)
I called Jenny [name] and asked her if she was planning on attending [facility] and she said "I like the people there but I don't see myself doing that right now. I am going out of town to visit my brother who I have not seen for a year".

## 2017-05-14 NOTE — Progress Notes (Signed)
Discharge Progress Report  Patient Details  Name: Jenny [name] MRN: [id number] Date of Birth: May 02, 1946 Referring Provider:     Cardiac Rehab from 04/24/2017 in [facility]  Referring Provider  Arida       Number of Visits: 1/36  Reason for Discharge:  Early Exit:  Personal  Smoking History:  Social History   Tobacco Use  Smoking Status Former Smoker  . Years: 30.00  . Last attempt to quit: 02/04/2000  . Years since quitting: 17.2  Smokeless Tobacco Never Used    Diagnosis:  No diagnosis found.  ADL UCSD:   Initial Exercise Prescription: Initial Exercise Prescription - 04/24/17 1400      Date of Initial Exercise RX and Referring Provider   Date  04/24/17    Referring Provider  Arida      Treadmill   MPH  1.7    Grade  0.5    Minutes  15    METs  2.4      Recumbant Bike   Level  1    RPM  60    Watts  8    Minutes  15    METs  2.4      Arm Ergometer   Level  1    RPM  50    Minutes  15    METs  2.4      REL-XR   Level  3    Speed  50    Minutes  15    METs  2.4      Prescription Details   Frequency (times per week)  3    Duration  Progress to 45 minutes of aerobic exercise without signs/symptoms of physical distress      Intensity   THRR 40-80% of Max Heartrate  117-140    Ratings of Perceived Exertion  11-15    Perceived Dyspnea  0-4      Resistance Training   Training Prescription  Yes    Weight  3 lb    Reps  10-15       Discharge Exercise Prescription (Final Exercise Prescription Changes): Exercise Prescription Changes - 04/24/17 1400      Response to Exercise   Blood Pressure (Admit)  112/64    Blood Pressure (Exercise)  120/86    Heart Rate (Admit)  99 bpm    Heart Rate (Exercise)  124 bpm    Heart Rate (Exit)  -- patient took off monitor before cool down HR taken    Oxygen Saturation (Admit)  95 %    Oxygen Saturation (Exit)  99 %    Rating of Perceived Exertion (Exercise)  7        Functional Capacity: 6 Minute Walk    Row Name 04/24/17 1445         6 Minute Walk   Distance  1000 feet     Walk Time  6 minutes     # of Rest Breaks  0     MPH  1.9     METS  2.4     RPE  7     Perceived Dyspnea   0     VO2 Peak  8.45     Symptoms  No     Resting HR  95 bpm     Resting BP  112/64     Resting Oxygen Saturation   95 %     Exercise Oxygen Saturation  during 6 min walk  99 %     Max Ex. HR  125 bpm     Max Ex. BP  120/56        Psychological, QOL, Others - Outcomes: PHQ 2/9: Depression screen [facility] 2/9 04/24/2017 03/04/2017 01/29/2017  Decreased Interest 0 0 -  Down, Depressed, Hopeless 0 0 2  PHQ - 2 Score 0 0 2  Altered sleeping 0 - -  Tired, decreased energy 1 - -  Change in appetite 0 - -  Feeling bad or failure about yourself  0 - -  Trouble concentrating 0 - -  Moving slowly or fidgety/restless 0 - -  Suicidal thoughts 0 - 0  PHQ-9 Score 1 - -  Difficult doing work/chores Not difficult at all - Not difficult at all    Quality of Life: Quality of Life - 04/24/17 1409      Quality of Life Scores   Health/Function Pre  29.2 %    Socioeconomic Pre  29 %    Psych/Spiritual Pre  30 %    Family Pre  28.8 %    GLOBAL Pre  29.27 %       Personal Goals: Goals established at orientation with interventions provided to work toward goal. Personal Goals and Risk Factors at Admission - 04/24/17 1402      Core Components/Risk Factors/Patient Goals on Admission    Weight Management  Yes    Intervention  Weight Management: Develop a combined nutrition and exercise program designed to reach desired caloric intake, while maintaining appropriate intake of nutrient and fiber, sodium and fats, and appropriate energy expenditure required for the weight goal.;Weight Management: Provide education and appropriate resources to help participant work on and attain dietary goals.;Weight Management/Obesity: Establish reasonable short term and long term weight goals.     Admit Weight  134 lb (60.8 kg)    Goal Weight: Short Term  128 lb (58.1 kg)    Goal Weight: Long Term  120 lb (54.4 kg)    Expected Outcomes  Short Term: Continue to assess and modify interventions until short term weight is achieved;Weight Maintenance: Understanding of the daily nutrition guidelines, which includes 25-35% calories from fat, 7% or less cal from saturated fats, less than 200mg  cholesterol, less than 1.5gm of sodium, & 5 or more servings of fruits and vegetables daily;Weight Loss: Understanding of general recommendations for a balanced deficit meal plan, which promotes 1-2 lb weight loss per week and includes a negative energy balance of [phone] kcal/d;Understanding recommendations for meals to include 15-35% energy as protein, 25-35% energy from fat, 35-60% energy from carbohydrates, less than 200mg  of dietary cholesterol, 20-35 gm of total fiber daily;Understanding of distribution of calorie intake throughout the day with the consumption of 4-5 meals/snacks    Diabetes  Yes    Intervention  Provide education about signs/symptoms and action to take for hypo/hyperglycemia.;Provide education about proper nutrition, including hydration, and aerobic/resistive exercise prescription along with prescribed medications to achieve blood glucose in normal ranges: Fasting glucose 65-99 mg/dL    Expected Outcomes  Short Term: Participant verbalizes understanding of the signs/symptoms and immediate care of hyper/hypoglycemia, proper foot care and importance of medication, aerobic/resistive exercise and nutrition plan for blood glucose control.;Long Term: Attainment of HbA1C < 7%.    Lipids  Yes    Intervention  Provide education and support for participant on nutrition & aerobic/resistive exercise along with prescribed medications to achieve LDL 70mg , HDL >40mg .    Expected Outcomes  Short Term: Participant states understanding  of desired cholesterol values and is compliant with medications  prescribed. Participant is following exercise prescription and nutrition guidelines.;Long Term: Cholesterol controlled with medications as prescribed, with individualized exercise RX and with personalized nutrition plan. Value goals: LDL < 70mg , HDL > 40 mg.    Stress  Yes    Intervention  Offer individual and/or small group education and counseling on adjustment to heart disease, stress management and health-related lifestyle change. Teach and support self-help strategies.;Refer participants experiencing significant psychosocial distress to appropriate mental health specialists for further evaluation and treatment. When possible, include family members and significant others in education/counseling sessions.    Expected Outcomes  Short Term: Participant demonstrates changes in health-related behavior, relaxation and other stress management skills, ability to obtain effective social support, and compliance with psychotropic medications if prescribed.;Long Term: Emotional wellbeing is indicated by absence of clinically significant psychosocial distress or social isolation.        Personal Goals Discharge:   Exercise Goals and Review: Exercise Goals    Row Name 04/24/17 1444             Exercise Goals   Increase Physical Activity  Yes       Intervention  Provide advice, education, support and counseling about physical activity/exercise needs.;Develop an individualized exercise prescription for aerobic and resistive training based on initial evaluation findings, risk stratification, comorbidities and participant's personal goals.       Expected Outcomes  Achievement of increased cardiorespiratory fitness and enhanced flexibility, muscular endurance and strength shown through measurements of functional capacity and personal statement of participant.       Increase Strength and Stamina  Yes       Intervention  Provide advice, education, support and counseling about physical activity/exercise  needs.;Develop an individualized exercise prescription for aerobic and resistive training based on initial evaluation findings, risk stratification, comorbidities and participant's personal goals.       Expected Outcomes  Achievement of increased cardiorespiratory fitness and enhanced flexibility, muscular endurance and strength shown through measurements of functional capacity and personal statement of participant.       Able to understand and use rate of perceived exertion (RPE) scale  Yes       Intervention  Provide education and explanation on how to use RPE scale       Expected Outcomes  Short Term: Able to use RPE daily in rehab to express subjective intensity level;Long Term:  Able to use RPE to guide intensity level when exercising independently       Able to understand and use Dyspnea scale  Yes       Intervention  Provide education and explanation on how to use Dyspnea scale       Expected Outcomes  Short Term: Able to use Dyspnea scale daily in rehab to express subjective sense of shortness of breath during exertion;Long Term: Able to use Dyspnea scale to guide intensity level when exercising independently       Knowledge and understanding of Target Heart Rate Range (THRR)  Yes       Intervention  Provide education and explanation of THRR including how the numbers were predicted and where they are located for reference       Expected Outcomes  Short Term: Able to state/look up THRR;Long Term: Able to use THRR to govern intensity when exercising independently;Short Term: Able to use daily as guideline for intensity in rehab       Able to check pulse independently  Yes  Intervention  Provide education and demonstration on how to check pulse in carotid and radial arteries.;Review the importance of being able to check your own pulse for safety during independent exercise       Expected Outcomes  Short Term: Able to explain why pulse checking is important during independent exercise;Long  Term: Able to check pulse independently and accurately       Understanding of Exercise Prescription  Yes       Intervention  Provide education, explanation, and written materials on patient's individual exercise prescription       Expected Outcomes  Short Term: Able to explain program exercise prescription;Long Term: Able to explain home exercise prescription to exercise independently          Nutrition & Weight - Outcomes: Pre Biometrics - 04/24/17 1443      Pre Biometrics   Height  4\' 10"  (1.473 m)    Weight  134 lb (60.8 kg)    Waist Circumference  35.5 inches    Hip Circumference  41.5 inches    Waist to Hip Ratio  0.86 %    BMI (Calculated)  28.01    Single Leg Stand  3.02 seconds        Nutrition: Nutrition Therapy & Goals - 04/24/17 1406      Intervention Plan   Intervention  Prescribe, educate and counsel regarding individualized specific dietary modifications aiming towards targeted core components such as weight, hypertension, lipid management, diabetes, heart failure and other comorbidities.;Nutrition handout(s) given to patient.    Expected Outcomes  Short Term Goal: Understand basic principles of dietary content, such as calories, fat, sodium, cholesterol and nutrients.;Short Term Goal: A plan has been developed with personal nutrition goals set during dietitian appointment.;Long Term Goal: Adherence to prescribed nutrition plan.       Nutrition Discharge: Nutrition Assessments - 04/24/17 1506      Rate Your Plate Scores   Pre Score  -- patient did not complete, to be done on 1st session       Education Questionnaire Score: Knowledge Questionnaire Score - 04/24/17 1413      Knowledge Questionnaire Score   Pre Score  23/28 Correct answers reviewed with patient who verbalized understanding.       Goals reviewed with patient; copy given to patient.

## 2017-05-15 ENCOUNTER — Ambulatory Visit: Payer: Medicare Other

## 2017-05-16 ENCOUNTER — Telehealth: Payer: Self-pay | Admitting: Cardiovascular Disease

## 2017-05-16 NOTE — Telephone Encounter (Signed)
Christy Ray with [facility] home health calling stating they received referral from [place] but has some questions   Question is that they received a referral from Cardiac rehab But the disciplines are not included.  Just wanted to know what really are we needing to be done with patient with home health.  Please advise

## 2017-05-19 ENCOUNTER — Ambulatory Visit: Payer: Medicare Other

## 2017-05-19 NOTE — Telephone Encounter (Signed)
Jenny [name] with [facility] received patient referral from Cardiac Rehab and would like to know what patient needs help with.  Routed to [place] in Cardiac Rehab.

## 2017-05-20 ENCOUNTER — Telehealth: Payer: Self-pay | Admitting: Cardiovascular Disease

## 2017-05-20 NOTE — Telephone Encounter (Signed)
Jenny [name] from [facility] needs a referral Received an email stating the patient would like to switch from Rehab to Home Health Please contact to discuss  Mobile [phone]

## 2017-05-21 ENCOUNTER — Ambulatory Visit: Payer: Medicare Other

## 2017-05-21 NOTE — Telephone Encounter (Signed)
S/w Vira Agar with [facility]. Pt was referred to Cardiac Rehab but per Lorene Dy, pt indicated to their staff that she did not want rehab and prefers PT at home. If appropriate and MD agreeable, she would need referral.  S/w pt who reports "I can do exercises at home. They were too personal in front of too many people."  She reports being able to complete ADLs, cooking meals and is driving.  No PT is needed at this time. Pt reports feeling well and is going to the beach Friday.  Advised pt to contact us with any concerns.

## 2017-05-26 ENCOUNTER — Ambulatory Visit: Payer: Medicare Other

## 2017-05-28 ENCOUNTER — Ambulatory Visit: Payer: Medicare Other

## 2017-05-29 ENCOUNTER — Ambulatory Visit: Payer: Medicare Other

## 2017-06-02 ENCOUNTER — Ambulatory Visit: Payer: Medicare Other

## 2017-06-04 ENCOUNTER — Encounter: Payer: Self-pay | Admitting: *Deleted

## 2017-06-04 ENCOUNTER — Ambulatory Visit: Payer: Medicare Other

## 2017-06-04 DIAGNOSIS — Z952 Presence of prosthetic heart valve: Secondary | ICD-10-CM

## 2017-06-04 NOTE — Progress Notes (Signed)
Cardiac Individual Treatment Plan  Patient Details  Name: Jenny [name] MRN: [id number] Date of Birth: [birth year] Referring Provider:     Cardiac Rehab from 04/24/2017 in [facility] Cardiac and Pulmonary Rehab  Referring Provider  Arida      Initial Encounter Date:    Cardiac Rehab from 04/24/2017 in [facility] Cardiac and Pulmonary Rehab  Date  04/24/17  Referring Provider  Kirke Corin      Visit Diagnosis: S/P TAVR (transcatheter aortic valve replacement)  Patient's Home Medications on Admission:  Current Outpatient Medications:  .  ALPRAZolam (XANAX) 0.5 MG tablet, Take 0.5-1 mg by mouth 2 (two) times daily. Take 0.5 mg in the morning and 1 mg at night., Disp: , Rfl:  .  hydrOXYzine (ATARAX/VISTARIL) 25 MG tablet, Take 50 mg by mouth at bedtime. , Disp: , Rfl:  .  LANTUS 100 UNIT/ML injection, Inject 20 Units into the skin daily. , Disp: , Rfl:  .  METOPROLOL SUCCINATE PO, Take 25 mg by mouth daily. 1/2 tablet daily, Disp: , Rfl:  .  rosuvastatin (CRESTOR) 10 MG tablet, Take 1 tablet (10 mg total) by mouth daily., Disp: 90 tablet, Rfl: 3 .  sertraline (ZOLOFT) 100 MG tablet, Take 100 mg by mouth 2 (two) times daily., Disp: , Rfl:  .  traMADol (ULTRAM) 50 MG tablet, Take 1 tablet (50 mg total) by mouth every 6 (six) hours as needed. (Patient not taking: Reported on 04/24/2017), Disp: 30 tablet, Rfl: 0  Past Medical History: Past Medical History:  Diagnosis Date  . Anxiety   . Arthritis   . CAD (coronary artery disease)    a. 01/2017 Cath: nonobs dzs.  . Chronic systolic CHF (congestive heart failure) (HCC)    a. TTE 12/2016, EF 45-50%, probable hypokinesis of the mid apical anteroseptal, anterior, & apical myocardium, GR2DD, possibly bicuspid aortic valve that was moderately thickened w/ severely calcified leaflets, severe aortic stenosis w/ mean gradient 47 mmHg, valve area 0.52 cm, mild MR, mildly dilated LA  . Depression   . Diabetes mellitus with complication (HCC)    Tylenol  .  Hyperlipidemia   . Hypothyroidism   . IBS (irritable bowel syndrome)   . Iron deficiency anemia    a. s/p pRBC x1 in 12/2016  . Microcytic anemia   . Panic attacks   . Severe aortic stenosis    a. TTE 12/2016: possibly bicuspid aortic valve that was moderately thickened with severely calcified leaflets. There was severe aortic stenosis with a mean gradient of 47 mmHg and valve area of 0.52 cm; b. 01/2017 s/p AVR w/ 19 mm bioprosthetic Magna Ease pericardial tissue valve, ser# [id number].  . Stroke (HCC) 2001  . TIA (transient ischemic attack) 2006    Tobacco Use: Social History   Tobacco Use  Smoking Status Former Smoker  . Years: 30.00  . Last attempt to quit: 02/04/2000  . Years since quitting: 17.3  Smokeless Tobacco Never Used    Labs: Recent Review Advice worker    Labs for ITP Cardiac and Pulmonary Rehab Latest Ref Rng & Units 02/17/2017 02/18/2017 02/18/2017 02/19/2017 02/20/2017   Cholestrol 0 - 200 mg/dL - - - - -   LDLCALC 0 - 99 mg/dL - - - - -   HDL >04 mg/dL - - - - -   Trlycerides <150 mg/dL - - - - -   Hemoglobin A1c 4.8 - 5.6 % - - - - -   PHART 7.350 - 7.450 7.336(L) - 7.330(L) - -  PCO2ART 32.0 - 48.0 mmHg 43.7 - 38.5 - -   HCO3 20.0 - 28.0 mmol/L 23.2 - 20.3 - -   TCO2 0 - 100 mmol/L 24 21 21 25  -   ACIDBASEDEF 0.0 - 2.0 mmol/L 2.0 - 5.0(H) - -   O2SAT % 97.0 - 93.0 74.8 72.9       Exercise Target Goals:    Exercise Program Goal: Individual exercise prescription set with THRR, safety & activity barriers. Participant demonstrates ability to understand and report RPE using BORG scale, to self-measure pulse accurately, and to acknowledge the importance of the exercise prescription.  Exercise Prescription Goal: Starting with aerobic activity 30 plus minutes a day, 3 days per week for initial exercise prescription. Provide home exercise prescription and guidelines that participant acknowledges understanding prior to discharge.  Activity Barriers & Risk  Stratification: Activity Barriers & Cardiac Risk Stratification - 04/24/17 1409      Activity Barriers & Cardiac Risk Stratification   Activity Barriers  History of Falls;Balance Concerns    Cardiac Risk Stratification  Moderate       6 Minute Walk: 6 Minute Walk    Row Name 04/24/17 1445         6 Minute Walk   Distance  1000 feet     Walk Time  6 minutes     # of Rest Breaks  0     MPH  1.9     METS  2.4     RPE  7     Perceived Dyspnea   0     VO2 Peak  8.45     Symptoms  No     Resting HR  95 bpm     Resting BP  112/64     Resting Oxygen Saturation   95 %     Exercise Oxygen Saturation  during 6 min walk  99 %     Max Ex. HR  125 bpm     Max Ex. BP  120/56        Oxygen Initial Assessment:   Oxygen Re-Evaluation:   Oxygen Discharge (Final Oxygen Re-Evaluation):   Initial Exercise Prescription: Initial Exercise Prescription - 04/24/17 1400      Date of Initial Exercise RX and Referring Provider   Date  04/24/17    Referring Provider  Arida      Treadmill   MPH  1.7    Grade  0.5    Minutes  15    METs  2.4      Recumbant Bike   Level  1    RPM  60    Watts  8    Minutes  15    METs  2.4      Arm Ergometer   Level  1    RPM  50    Minutes  15    METs  2.4      REL-XR   Level  3    Speed  50    Minutes  15    METs  2.4      Prescription Details   Frequency (times per week)  3    Duration  Progress to 45 minutes of aerobic exercise without signs/symptoms of physical distress      Intensity   THRR 40-80% of Max Heartrate  117-140    Ratings of Perceived Exertion  11-15    Perceived Dyspnea  0-4      Resistance Training   Training Prescription  Yes    Weight  3 lb    Reps  10-15       Perform Capillary Blood Glucose checks as needed.  Exercise Prescription Changes: Exercise Prescription Changes    Row Name 04/24/17 1400             Response to Exercise   Blood Pressure (Admit)  112/64       Blood Pressure (Exercise)   120/86       Heart Rate (Admit)  99 bpm       Heart Rate (Exercise)  124 bpm       Heart Rate (Exit)  - patient took off monitor before cool down HR taken       Oxygen Saturation (Admit)  95 %       Oxygen Saturation (Exit)  99 %       Rating of Perceived Exertion (Exercise)  7          Exercise Comments:   Exercise Goals and Review: Exercise Goals    Row Name 04/24/17 1444             Exercise Goals   Increase Physical Activity  Yes       Intervention  Provide advice, education, support and counseling about physical activity/exercise needs.;Develop an individualized exercise prescription for aerobic and resistive training based on initial evaluation findings, risk stratification, comorbidities and participant's personal goals.       Expected Outcomes  Achievement of increased cardiorespiratory fitness and enhanced flexibility, muscular endurance and strength shown through measurements of functional capacity and personal statement of participant.       Increase Strength and Stamina  Yes       Intervention  Provide advice, education, support and counseling about physical activity/exercise needs.;Develop an individualized exercise prescription for aerobic and resistive training based on initial evaluation findings, risk stratification, comorbidities and participant's personal goals.       Expected Outcomes  Achievement of increased cardiorespiratory fitness and enhanced flexibility, muscular endurance and strength shown through measurements of functional capacity and personal statement of participant.       Able to understand and use rate of perceived exertion (RPE) scale  Yes       Intervention  Provide education and explanation on how to use RPE scale       Expected Outcomes  Short Term: Able to use RPE daily in rehab to express subjective intensity level;Long Term:  Able to use RPE to guide intensity level when exercising independently       Able to understand and use Dyspnea scale   Yes       Intervention  Provide education and explanation on how to use Dyspnea scale       Expected Outcomes  Short Term: Able to use Dyspnea scale daily in rehab to express subjective sense of shortness of breath during exertion;Long Term: Able to use Dyspnea scale to guide intensity level when exercising independently       Knowledge and understanding of Target Heart Rate Range (THRR)  Yes       Intervention  Provide education and explanation of THRR including how the numbers were predicted and where they are located for reference       Expected Outcomes  Short Term: Able to state/look up THRR;Long Term: Able to use THRR to govern intensity when exercising independently;Short Term: Able to use daily as guideline for intensity in rehab       Able to check pulse independently  Yes       Intervention  Provide education and demonstration on how to check pulse in carotid and radial arteries.;Review the importance of being able to check your own pulse for safety during independent exercise       Expected Outcomes  Short Term: Able to explain why pulse checking is important during independent exercise;Long Term: Able to check pulse independently and accurately       Understanding of Exercise Prescription  Yes       Intervention  Provide education, explanation, and written materials on patient's individual exercise prescription       Expected Outcomes  Short Term: Able to explain program exercise prescription;Long Term: Able to explain home exercise prescription to exercise independently          Exercise Goals Re-Evaluation :   Discharge Exercise Prescription (Final Exercise Prescription Changes): Exercise Prescription Changes - 04/24/17 1400      Response to Exercise   Blood Pressure (Admit)  112/64    Blood Pressure (Exercise)  120/86    Heart Rate (Admit)  99 bpm    Heart Rate (Exercise)  124 bpm    Heart Rate (Exit)  -- patient took off monitor before cool down HR taken    Oxygen  Saturation (Admit)  95 %    Oxygen Saturation (Exit)  99 %    Rating of Perceived Exertion (Exercise)  7       Nutrition:  Target Goals: Understanding of nutrition guidelines, daily intake of sodium 1500mg , cholesterol 200mg , calories 30% from fat and 7% or less from saturated fats, daily to have 5 or more servings of fruits and vegetables.  Biometrics: Pre Biometrics - 04/24/17 1443      Pre Biometrics   Height  4\' 10"  (1.473 m)    Weight  134 lb (60.8 kg)    Waist Circumference  35.5 inches    Hip Circumference  41.5 inches    Waist to Hip Ratio  0.86 %    BMI (Calculated)  28.01    Single Leg Stand  3.02 seconds        Nutrition Therapy Plan and Nutrition Goals: Nutrition Therapy & Goals - 04/24/17 1406      Intervention Plan   Intervention  Prescribe, educate and counsel regarding individualized specific dietary modifications aiming towards targeted core components such as weight, hypertension, lipid management, diabetes, heart failure and other comorbidities.;Nutrition handout(s) given to patient.    Expected Outcomes  Short Term Goal: Understand basic principles of dietary content, such as calories, fat, sodium, cholesterol and nutrients.;Short Term Goal: A plan has been developed with personal nutrition goals set during dietitian appointment.;Long Term Goal: Adherence to prescribed nutrition plan.       Nutrition Discharge: Rate Your Plate Scores: Nutrition Assessments - 04/24/17 1506      Rate Your Plate Scores   Pre Score  -- patient did not complete, to be done on 1st session       Nutrition Goals Re-Evaluation:   Nutrition Goals Discharge (Final Nutrition Goals Re-Evaluation):   Psychosocial: Target Goals: Acknowledge presence or absence of significant depression and/or stress, maximize coping skills, provide positive support system. Participant is able to verbalize types and ability to use techniques and skills needed for reducing stress and depression.    Initial Review & Psychosocial Screening: Initial Psych Review & Screening - 04/24/17 1407      Initial Review   Current issues with  History of Depression;Current Anxiety/Panic;Current Psychotropic Meds on xanax  and zoloft      Family Dynamics   Good Support System?  Yes family and friends    Comments  Patients son is bi-polar and while he is married he has many appointments and is not allowed to drive when he takes certain medication.      Barriers   Psychosocial barriers to participate in program  The patient should benefit from training in stress management and relaxation.      Screening Interventions   Interventions  Yes;Encouraged to exercise;Program counselor consult;To provide support and resources with identified psychosocial needs;Provide feedback about the scores to participant Scores reviewed with patient who verbalized understanding.     Expected Outcomes  Short Term goal: Utilizing psychosocial counselor, staff and physician to assist with identification of specific Stressors or current issues interfering with healing process. Setting desired goal for each stressor or current issue identified.;Long Term Goal: Stressors or current issues are controlled or eliminated.;Short Term goal: Identification and review with participant of any Quality of Life or Depression concerns found by scoring the questionnaire.;Long Term goal: The participant improves quality of Life and PHQ9 Scores as seen by post scores and/or verbalization of changes       Quality of Life Scores:  Quality of Life - 04/24/17 1409      Quality of Life Scores   Health/Function Pre  29.2 %    Socioeconomic Pre  29 %    Psych/Spiritual Pre  30 %    Family Pre  28.8 %    GLOBAL Pre  29.27 %       PHQ-9: Recent Review Flowsheet Data    Depression screen [facility] 2/9 04/24/2017 03/04/2017 01/29/2017   Decreased Interest 0 0 -   Down, Depressed, Hopeless 0 0 2   PHQ - 2 Score 0 0 2   Altered sleeping 0 - -   Tired,  decreased energy 1 - -   Change in appetite 0 - -   Feeling bad or failure about yourself  0 - -   Trouble concentrating 0 - -   Moving slowly or fidgety/restless 0 - -   Suicidal thoughts 0 - 0   PHQ-9 Score 1 - -   Difficult doing work/chores Not difficult at all - Not difficult at all     Interpretation of Total Score  Total Score Depression Severity:  1-4 = Minimal depression, 5-9 = Mild depression, 10-14 = Moderate depression, 15-19 = Moderately severe depression, 20-27 = Severe depression   Psychosocial Evaluation and Intervention:   Psychosocial Re-Evaluation: Psychosocial Re-Evaluation    Row Name 04/28/17 1453             Psychosocial Re-Evaluation   Comments  Adasyn called to say she is sorry she will be unable to attend Cardiac Rehab this afternoon since she has a sinus infection.          Psychosocial Discharge (Final Psychosocial Re-Evaluation): Psychosocial Re-Evaluation - 04/28/17 1453      Psychosocial Re-Evaluation   Comments  Breslin called to say she is sorry she will be unable to attend Cardiac Rehab this afternoon since she has a sinus infection.       Vocational Rehabilitation: Provide vocational rehab assistance to qualifying candidates.   Vocational Rehab Evaluation & Intervention: Vocational Rehab - 04/24/17 1414      Initial Vocational Rehab Evaluation & Intervention   Assessment shows need for Vocational Rehabilitation  No    Vocational Rehab Packet given to patient  --  Documents faxed to [facility] of Vocational Rehabilitation  --       Education: Education Goals: Education classes will be provided on a variety of topics geared toward better understanding of heart health and risk factor modification. Participant will state understanding/return demonstration of topics presented as noted by education test scores.  Learning Barriers/Preferences: Learning Barriers/Preferences - 04/24/17 1413      Learning Barriers/Preferences    Learning Barriers  None    Learning Preferences  None       Education Topics: General Nutrition Guidelines/Fats and Fiber: -Group instruction provided by verbal, written material, models and posters to present the general guidelines for heart healthy nutrition. Gives an explanation and review of dietary fats and fiber.   Controlling Sodium/Reading Food Labels: -Group verbal and written material supporting the discussion of sodium use in heart healthy nutrition. Review and explanation with models, verbal and written materials for utilization of the food label.   Exercise Physiology & Risk Factors: - Group verbal and written instruction with models to review the exercise physiology of the cardiovascular system and associated critical values. Details cardiovascular disease risk factors and the goals associated with each risk factor.   Aerobic Exercise & Resistance Training: - Gives group verbal and written discussion on the health impact of inactivity. On the components of aerobic and resistive training programs and the benefits of this training and how to safely progress through these programs.   Flexibility, Balance, General Exercise Guidelines: - Provides group verbal and written instruction on the benefits of flexibility and balance training programs. Provides general exercise guidelines with specific guidelines to those with heart or lung disease. Demonstration and skill practice provided.   Stress Management: - Provides group verbal and written instruction about the health risks of elevated stress, cause of high stress, and healthy ways to reduce stress.   Depression: - Provides group verbal and written instruction on the correlation between heart/lung disease and depressed mood, treatment options, and the stigmas associated with seeking treatment.   Anatomy & Physiology of the Heart: - Group verbal and written instruction and models provide basic cardiac anatomy and physiology,  with the coronary electrical and arterial systems. Review of: AMI, Angina, Valve disease, Heart Failure, Cardiac Arrhythmia, Pacemakers, and the ICD.   Cardiac Procedures: - Group verbal and written instruction to review commonly prescribed medications for heart disease. Reviews the medication, class of the drug, and side effects. Includes the steps to properly store meds and maintain the prescription regimen. (beta blockers and nitrates)   Cardiac Medications I: - Group verbal and written instruction to review commonly prescribed medications for heart disease. Reviews the medication, class of the drug, and side effects. Includes the steps to properly store meds and maintain the prescription regimen.   Cardiac Medications II: -Group verbal and written instruction to review commonly prescribed medications for heart disease. Reviews the medication, class of the drug, and side effects. (all other drug classes)    Go Sex-Intimacy & Heart Disease, Get SMART - Goal Setting: - Group verbal and written instruction through game format to discuss heart disease and the return to sexual intimacy. Provides group verbal and written material to discuss and apply goal setting through the application of the S.M.A.R.T. Method.   Other Matters of the Heart: - Provides group verbal, written materials and models to describe Heart Failure, Angina, Valve Disease, Peripheral Artery Disease, and Diabetes in the realm of heart disease. Includes description of the disease process and treatment options available to the  cardiac patient.   Exercise & Equipment Safety: - Individual verbal instruction and demonstration of equipment use and safety with use of the equipment.   Cardiac Rehab from 04/24/2017 in [facility] Cardiac and Pulmonary Rehab  Date  04/24/17  Educator  KS  Instruction Review Code  1- Verbalizes Understanding      Infection Prevention: - Provides verbal and written material to individual with  discussion of infection control including proper hand washing and proper equipment cleaning during exercise session.   Cardiac Rehab from 04/24/2017 in [facility] Cardiac and Pulmonary Rehab  Date  04/24/17  Educator  KS  Instruction Review Code  1- Verbalizes Understanding      Falls Prevention: - Provides verbal and written material to individual with discussion of falls prevention and safety.   Cardiac Rehab from 04/24/2017 in [facility] Cardiac and Pulmonary Rehab  Date  04/24/17  Educator  KS  Instruction Review Code  1- Verbalizes Understanding      Diabetes: - Individual verbal and written instruction to review signs/symptoms of diabetes, desired ranges of glucose level fasting, after meals and with exercise. Acknowledge that pre and post exercise glucose checks will be done for 3 sessions at entry of program.   Other: -Provides group and verbal instruction on various topics (see comments)    Knowledge Questionnaire Score: Knowledge Questionnaire Score - 04/24/17 1413      Knowledge Questionnaire Score   Pre Score  23/28 Correct answers reviewed with patient who verbalized understanding.       Core Components/Risk Factors/Patient Goals at Admission: Personal Goals and Risk Factors at Admission - 04/24/17 1402      Core Components/Risk Factors/Patient Goals on Admission    Weight Management  Yes    Intervention  Weight Management: Develop a combined nutrition and exercise program designed to reach desired caloric intake, while maintaining appropriate intake of nutrient and fiber, sodium and fats, and appropriate energy expenditure required for the weight goal.;Weight Management: Provide education and appropriate resources to help participant work on and attain dietary goals.;Weight Management/Obesity: Establish reasonable short term and long term weight goals.    Admit Weight  134 lb (60.8 kg)    Goal Weight: Short Term  128 lb (58.1 kg)    Goal Weight: Long Term  120 lb (54.4 kg)     Expected Outcomes  Short Term: Continue to assess and modify interventions until short term weight is achieved;Weight Maintenance: Understanding of the daily nutrition guidelines, which includes 25-35% calories from fat, 7% or less cal from saturated fats, less than 200mg  cholesterol, less than 1.5gm of sodium, & 5 or more servings of fruits and vegetables daily;Weight Loss: Understanding of general recommendations for a balanced deficit meal plan, which promotes 1-2 lb weight loss per week and includes a negative energy balance of [phone] kcal/d;Understanding recommendations for meals to include 15-35% energy as protein, 25-35% energy from fat, 35-60% energy from carbohydrates, less than 200mg  of dietary cholesterol, 20-35 gm of total fiber daily;Understanding of distribution of calorie intake throughout the day with the consumption of 4-5 meals/snacks    Diabetes  Yes    Intervention  Provide education about signs/symptoms and action to take for hypo/hyperglycemia.;Provide education about proper nutrition, including hydration, and aerobic/resistive exercise prescription along with prescribed medications to achieve blood glucose in normal ranges: Fasting glucose 65-99 mg/dL    Expected Outcomes  Short Term: Participant verbalizes understanding of the signs/symptoms and immediate care of hyper/hypoglycemia, proper foot care and importance of medication, aerobic/resistive exercise and  nutrition plan for blood glucose control.;Long Term: Attainment of HbA1C < 7%.    Lipids  Yes    Intervention  Provide education and support for participant on nutrition & aerobic/resistive exercise along with prescribed medications to achieve LDL 70mg , HDL >40mg .    Expected Outcomes  Short Term: Participant states understanding of desired cholesterol values and is compliant with medications prescribed. Participant is following exercise prescription and nutrition guidelines.;Long Term: Cholesterol controlled with  medications as prescribed, with individualized exercise RX and with personalized nutrition plan. Value goals: LDL < 70mg , HDL > 40 mg.    Stress  Yes    Intervention  Offer individual and/or small group education and counseling on adjustment to heart disease, stress management and health-related lifestyle change. Teach and support self-help strategies.;Refer participants experiencing significant psychosocial distress to appropriate mental health specialists for further evaluation and treatment. When possible, include family members and significant others in education/counseling sessions.    Expected Outcomes  Short Term: Participant demonstrates changes in health-related behavior, relaxation and other stress management skills, ability to obtain effective social support, and compliance with psychotropic medications if prescribed.;Long Term: Emotional wellbeing is indicated by absence of clinically significant psychosocial distress or social isolation.       Core Components/Risk Factors/Patient Goals Review:    Core Components/Risk Factors/Patient Goals at Discharge (Final Review):    ITP Comments: ITP Comments    Row Name 04/24/17 1356 04/28/17 1452 05/07/17 0609 05/14/17 1440 06/04/17 0643   ITP Comments  Medical Review Completed; initial ITP created. Diagnosis Documentation can be found in [facility] encounter dated 02/10/2017.  Neshia called to say she is sorry she will be unable to attend Cardiac Rehab this afternoon since she has a sinus infection.   30 day review. Continue with ITP unless directed changes per Medical Director review. Med review completed not other sessions  I called Tanina and asked her if she was planning on attending [facility] and she said "I like the people there but I don't see myself doing that right now. I am going out of town to visit my brother who I have not seen for a year".   Discharged      Comments:

## 2017-06-05 ENCOUNTER — Ambulatory Visit: Payer: Medicare Other

## 2017-06-05 DIAGNOSIS — R079 Chest pain, unspecified: Secondary | ICD-10-CM | POA: Diagnosis not present

## 2017-06-05 DIAGNOSIS — E669 Obesity, unspecified: Secondary | ICD-10-CM | POA: Diagnosis not present

## 2017-06-05 DIAGNOSIS — F411 Generalized anxiety disorder: Secondary | ICD-10-CM | POA: Diagnosis not present

## 2017-06-05 DIAGNOSIS — E119 Type 2 diabetes mellitus without complications: Secondary | ICD-10-CM | POA: Diagnosis not present

## 2017-06-09 ENCOUNTER — Ambulatory Visit: Payer: Medicare Other

## 2017-06-11 ENCOUNTER — Ambulatory Visit: Payer: Medicare Other

## 2017-06-12 ENCOUNTER — Ambulatory Visit: Payer: Medicare Other

## 2017-06-16 ENCOUNTER — Ambulatory Visit: Payer: Medicare Other

## 2017-06-18 ENCOUNTER — Ambulatory Visit: Payer: Medicare Other

## 2017-06-19 ENCOUNTER — Ambulatory Visit: Payer: Medicare Other

## 2017-06-20 DIAGNOSIS — E119 Type 2 diabetes mellitus without complications: Secondary | ICD-10-CM | POA: Diagnosis not present

## 2017-06-20 DIAGNOSIS — F411 Generalized anxiety disorder: Secondary | ICD-10-CM | POA: Diagnosis not present

## 2017-06-20 DIAGNOSIS — R079 Chest pain, unspecified: Secondary | ICD-10-CM | POA: Diagnosis not present

## 2017-06-20 DIAGNOSIS — S43429A Sprain of unspecified rotator cuff capsule, initial encounter: Secondary | ICD-10-CM | POA: Diagnosis not present

## 2017-06-20 DIAGNOSIS — I10 Essential (primary) hypertension: Secondary | ICD-10-CM | POA: Diagnosis not present

## 2017-06-23 ENCOUNTER — Ambulatory Visit: Payer: Medicare Other

## 2017-06-25 ENCOUNTER — Ambulatory Visit: Payer: Medicare Other

## 2017-06-26 ENCOUNTER — Ambulatory Visit: Payer: Medicare Other

## 2017-06-30 ENCOUNTER — Ambulatory Visit: Payer: Medicare Other

## 2017-07-02 ENCOUNTER — Ambulatory Visit: Payer: Medicare Other

## 2017-07-03 ENCOUNTER — Ambulatory Visit: Payer: Medicare Other

## 2017-07-07 ENCOUNTER — Ambulatory Visit: Payer: Medicare Other

## 2017-07-09 ENCOUNTER — Ambulatory Visit: Payer: Medicare Other

## 2017-07-10 ENCOUNTER — Ambulatory Visit: Payer: Medicare Other

## 2017-07-14 ENCOUNTER — Ambulatory Visit: Payer: Medicare Other

## 2017-07-14 DIAGNOSIS — I10 Essential (primary) hypertension: Secondary | ICD-10-CM | POA: Diagnosis not present

## 2017-07-14 DIAGNOSIS — F411 Generalized anxiety disorder: Secondary | ICD-10-CM | POA: Diagnosis not present

## 2017-07-14 DIAGNOSIS — E119 Type 2 diabetes mellitus without complications: Secondary | ICD-10-CM | POA: Diagnosis not present

## 2017-07-14 DIAGNOSIS — E669 Obesity, unspecified: Secondary | ICD-10-CM | POA: Diagnosis not present

## 2017-07-16 ENCOUNTER — Ambulatory Visit: Payer: Medicare Other

## 2017-07-17 ENCOUNTER — Ambulatory Visit: Payer: Medicare Other

## 2017-07-21 ENCOUNTER — Ambulatory Visit: Payer: Medicare Other

## 2017-07-23 ENCOUNTER — Ambulatory Visit: Payer: Medicare Other

## 2017-07-24 ENCOUNTER — Ambulatory Visit: Payer: Medicare Other

## 2017-08-14 DIAGNOSIS — E119 Type 2 diabetes mellitus without complications: Secondary | ICD-10-CM | POA: Diagnosis not present

## 2017-08-14 DIAGNOSIS — E669 Obesity, unspecified: Secondary | ICD-10-CM | POA: Diagnosis not present

## 2017-08-14 DIAGNOSIS — F411 Generalized anxiety disorder: Secondary | ICD-10-CM | POA: Diagnosis not present

## 2017-08-14 DIAGNOSIS — K219 Gastro-esophageal reflux disease without esophagitis: Secondary | ICD-10-CM | POA: Diagnosis not present

## 2017-09-22 DIAGNOSIS — M67912 Unspecified disorder of synovium and tendon, left shoulder: Secondary | ICD-10-CM | POA: Diagnosis not present

## 2017-09-22 DIAGNOSIS — M25512 Pain in left shoulder: Secondary | ICD-10-CM | POA: Diagnosis not present

## 2017-09-22 DIAGNOSIS — E119 Type 2 diabetes mellitus without complications: Secondary | ICD-10-CM | POA: Diagnosis not present

## 2017-09-22 DIAGNOSIS — F411 Generalized anxiety disorder: Secondary | ICD-10-CM | POA: Diagnosis not present

## 2017-09-22 DIAGNOSIS — E669 Obesity, unspecified: Secondary | ICD-10-CM | POA: Diagnosis not present

## 2017-10-20 DIAGNOSIS — E119 Type 2 diabetes mellitus without complications: Secondary | ICD-10-CM | POA: Diagnosis not present

## 2017-10-20 DIAGNOSIS — I10 Essential (primary) hypertension: Secondary | ICD-10-CM | POA: Diagnosis not present

## 2017-10-20 DIAGNOSIS — F419 Anxiety disorder, unspecified: Secondary | ICD-10-CM | POA: Diagnosis not present

## 2017-10-20 DIAGNOSIS — E669 Obesity, unspecified: Secondary | ICD-10-CM | POA: Diagnosis not present

## 2017-11-03 IMAGING — DX DG CHEST 1V PORT
1 series · 1 of 1 positions shown · non-contrast
Comparison: Portable chest x-ray February 17, 2017

CLINICAL DATA: Status post aortic valve replacement yesterday.

EXAM:
PORTABLE CHEST 1 VIEW

[chest ap]
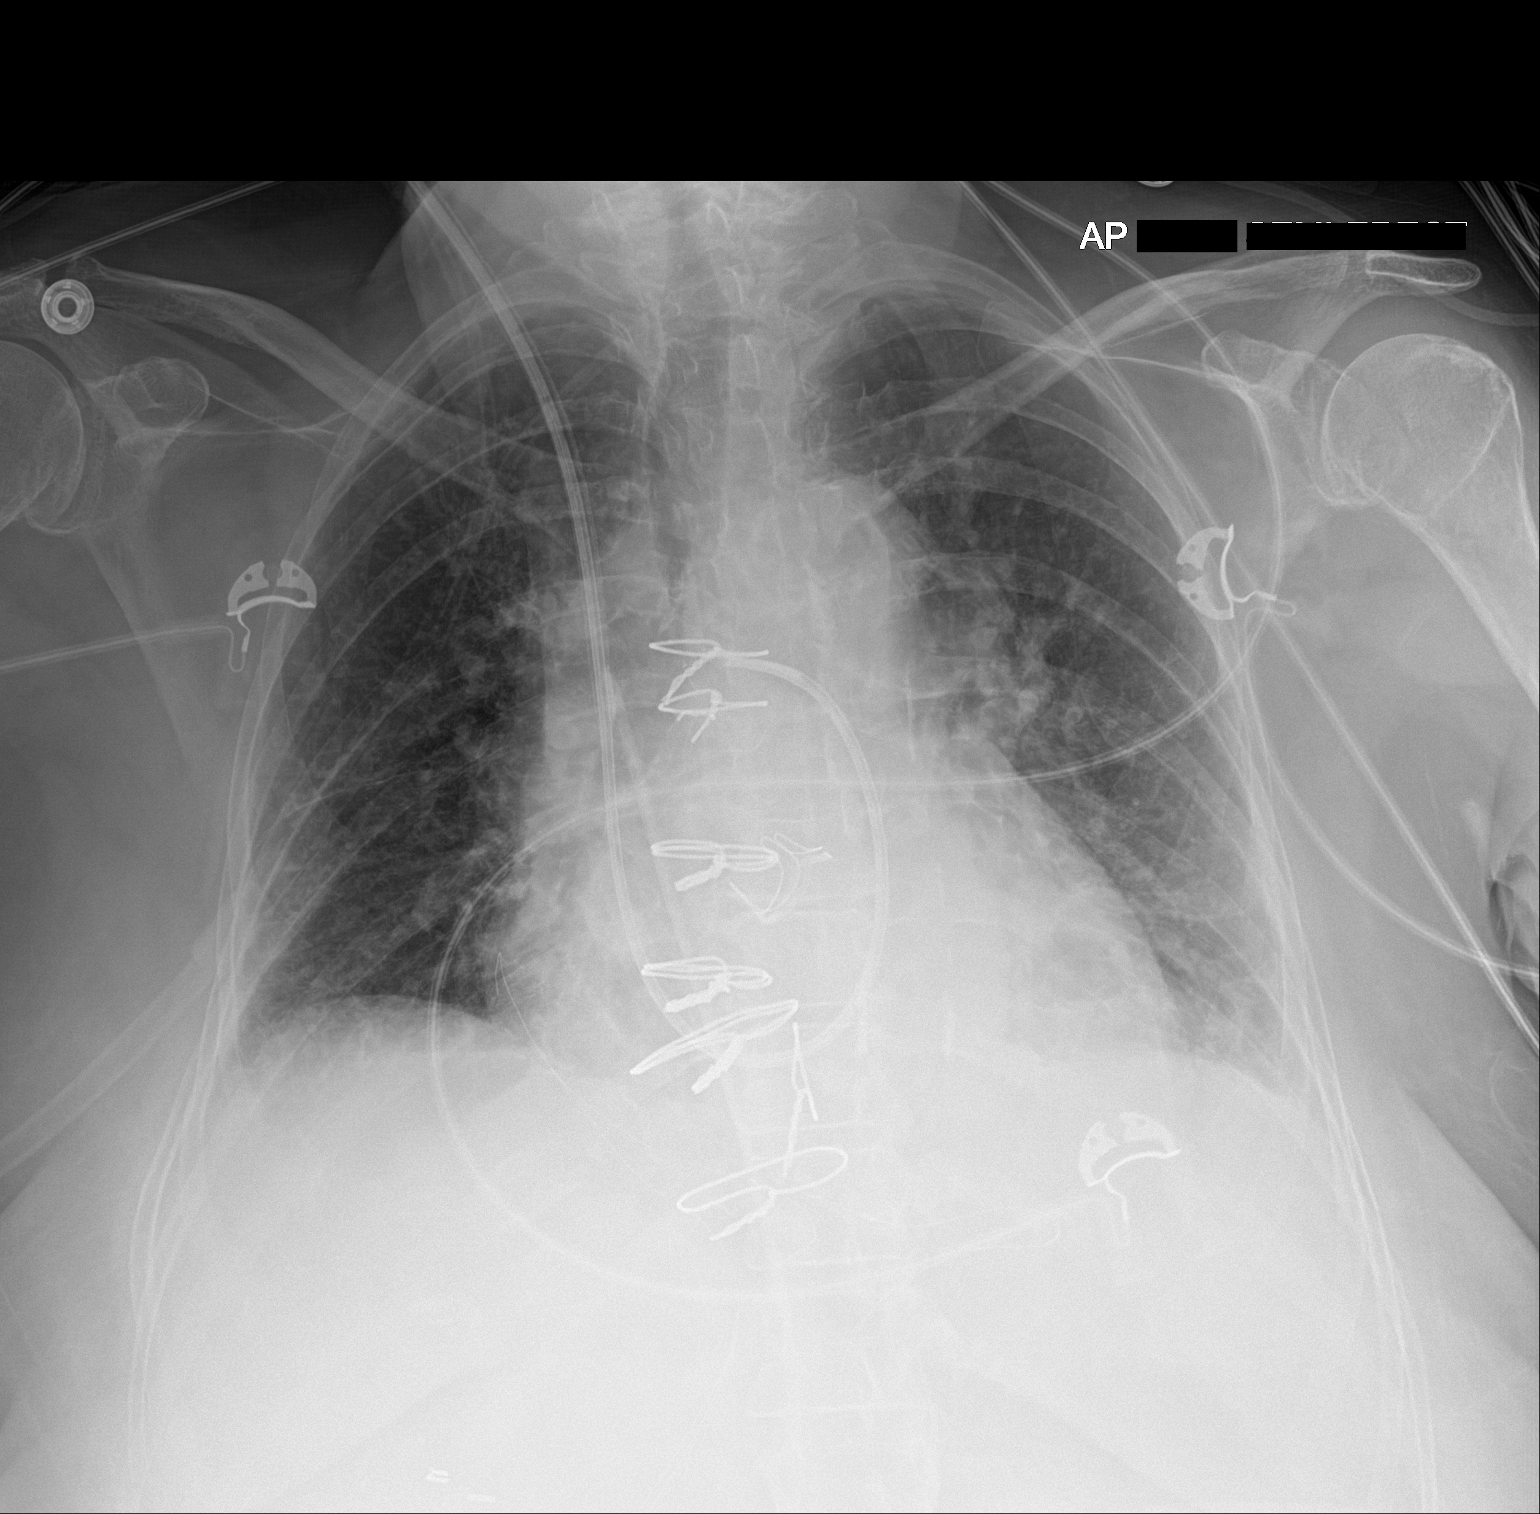

[1 of 1 positions shown; findings below may reference images not displayed]

FINDINGS: The endotracheal and esophagogastric tubes have been removed. The
lungs are reasonably well inflated. The interstitial markings are
slightly more conspicuous especially on the left. The cardiac
silhouette is enlarged but stable. The prosthetic aortic valve is in
stable position. The pulmonary vascularity is slightly more
engorged. There is calcification in the wall of the thoracic aorta.
The Swan-Ganz catheter has been uncoiled. The tip lies in the
proximal right main pulmonary artery. The mediastinal drain is in
stable position.
IMPRESSION: Slight interval increase in the conspicuity of the pulmonary
interstitium and pulmonary vascularity since extubation consistent
with mild CHF. Persistent small amount of left lower lobe
atelectasis. No pneumothorax or significant pleural effusion.

Good positioning of the Swan-Ganz catheter with resolution of the
coiling previously demonstrated.

Thoracic aortic atherosclerosis.

## 2017-11-04 IMAGING — DX DG CHEST 1V PORT
1 series · 1 of 1 positions shown · non-contrast
Comparison: 02/18/2017

CLINICAL DATA: Chest pain following aortic valve replacement

EXAM:
PORTABLE CHEST 1 VIEW

[chest ap]
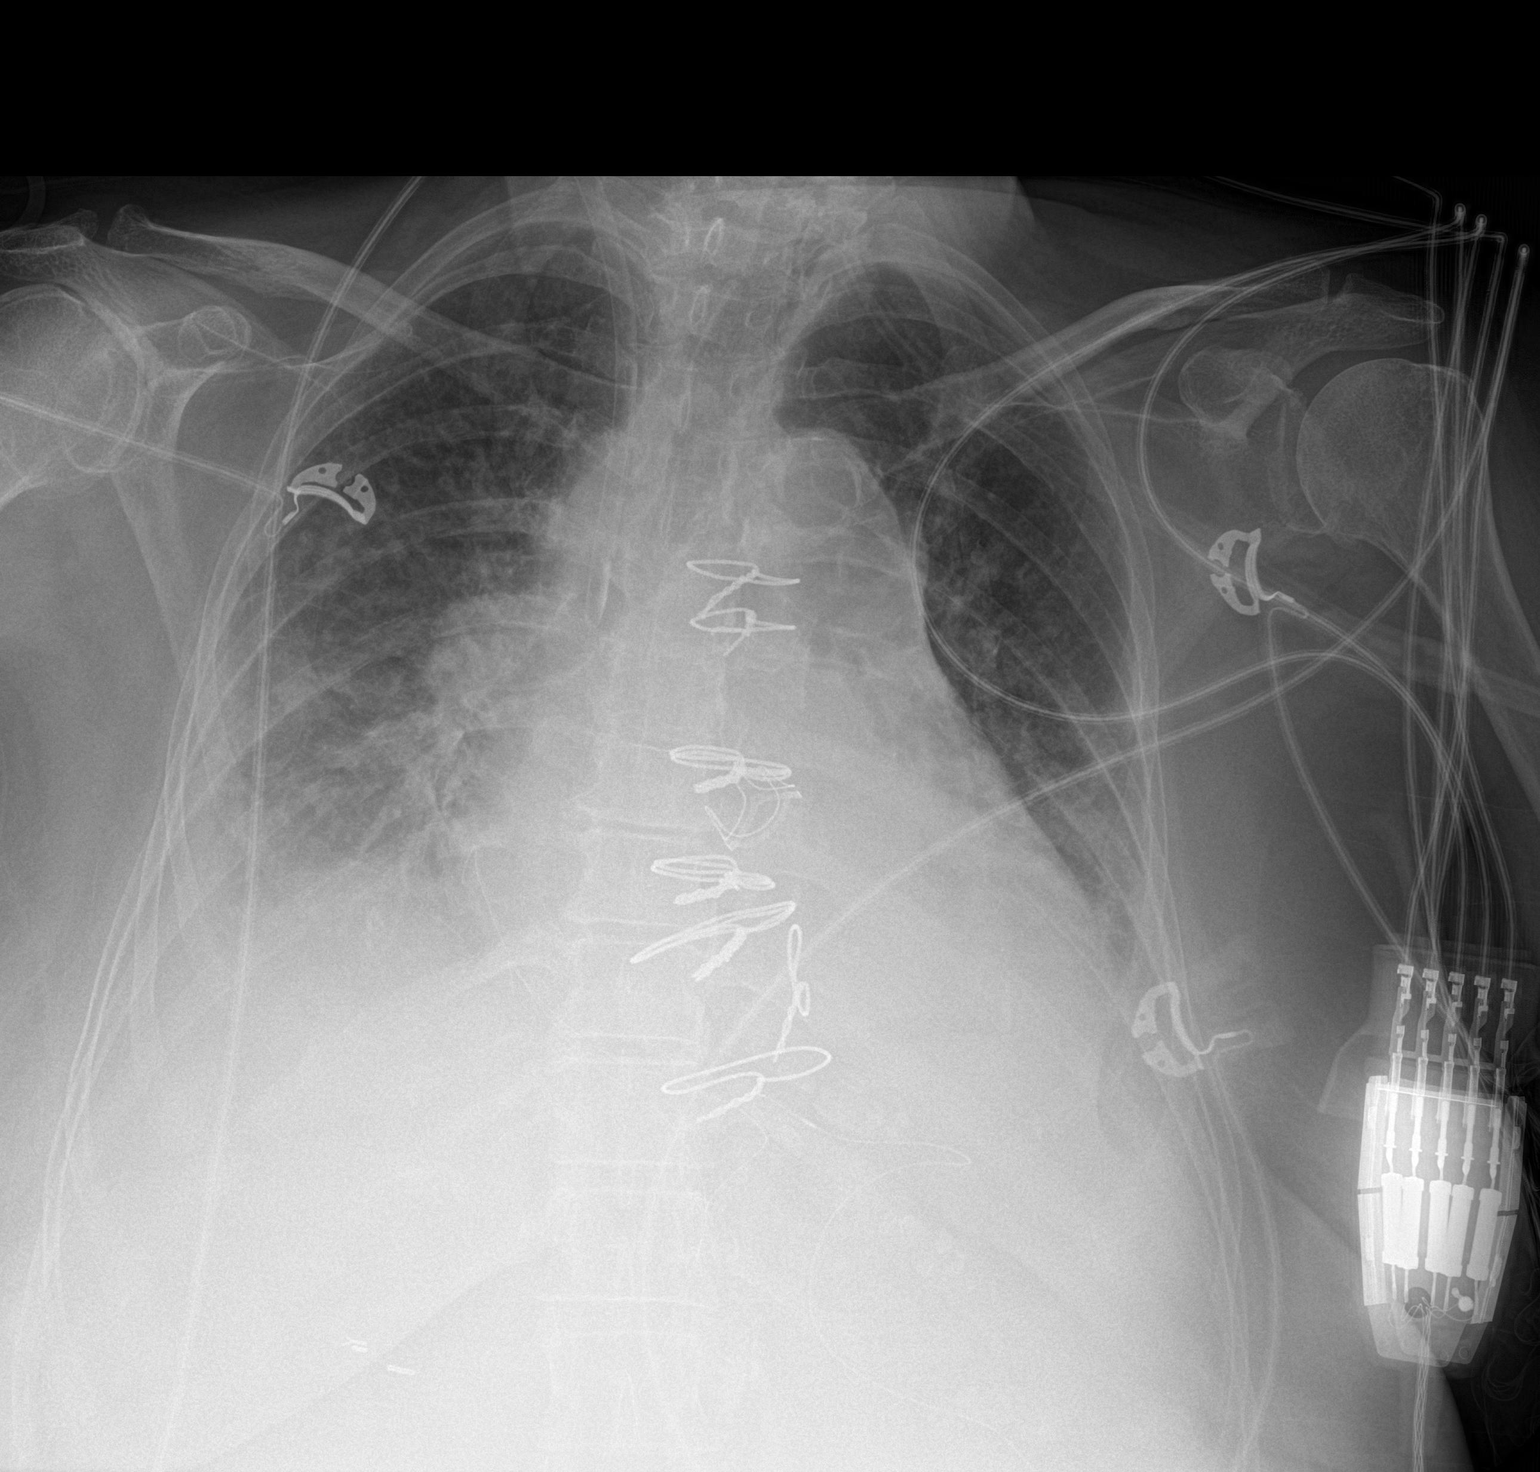

[1 of 1 positions shown; findings below may reference images not displayed]

FINDINGS: Cardiac shadow is enlarged but stable. Aortic calcifications are
again seen. Postsurgical changes are again noted. The mediastinal
drain and Swan-Ganz catheter have been removed in the interval.
Right jugular sheath remains. Small bilateral pleural effusions are
noted increased from the prior exam. Mild vascular congestion is
noted.
IMPRESSION: Mild vascular congestion with increasing effusions. Likely
underlying atelectasis is present as well.

## 2017-11-05 IMAGING — DX DG CHEST 1V PORT
1 series · 1 of 1 positions shown · non-contrast
Comparison: 02/19/2017

CLINICAL DATA: Chest soreness post aortic valve replacement

EXAM:
PORTABLE CHEST 1 VIEW

[chest]
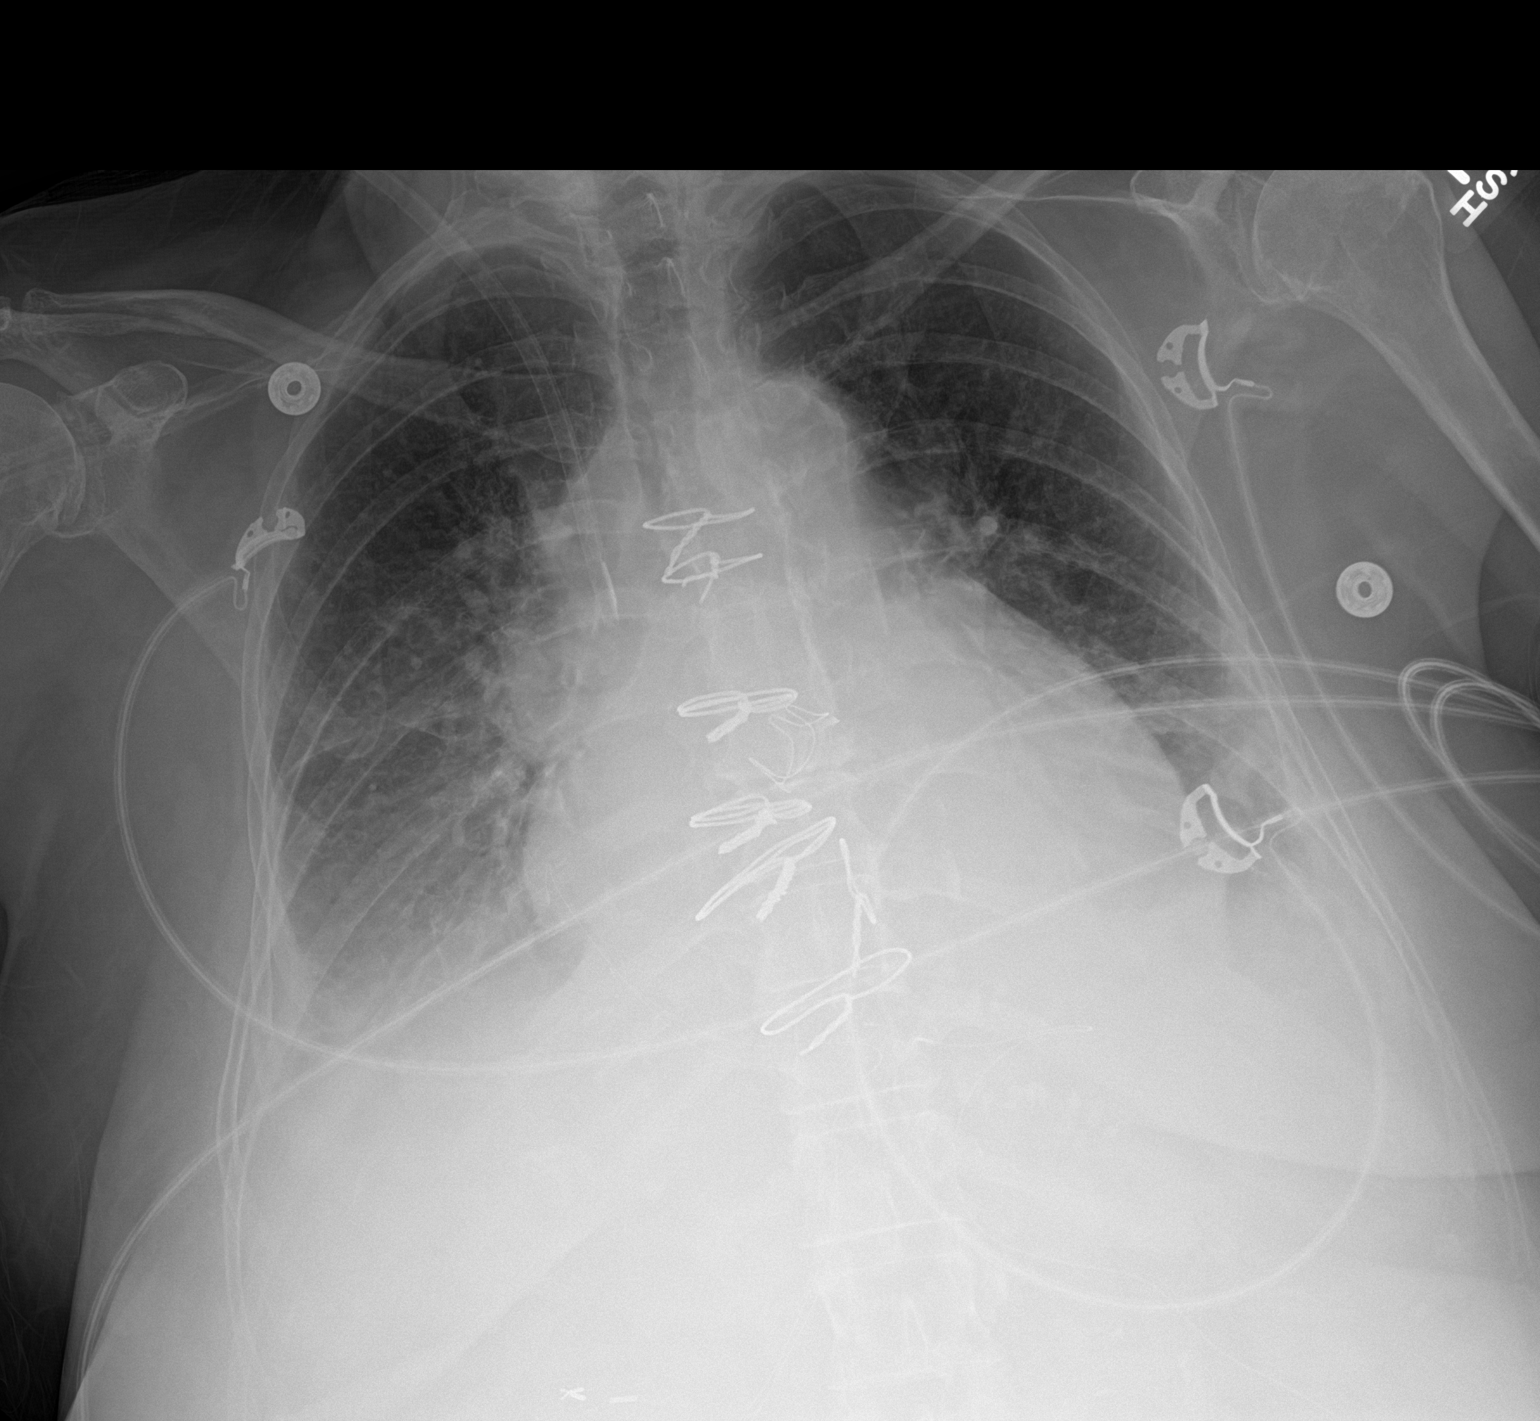

[1 of 1 positions shown; findings below may reference images not displayed]

FINDINGS: Right internal jugular vascular sheath remains in place, unchanged.
Post aortic valve replacement. Cardiomegaly with vascular
congestion, small bilateral effusions and bibasilar opacities,
likely atelectasis. Findings are similar to prior study.
IMPRESSION: No significant change since prior study.

## 2017-11-08 IMAGING — DX DG CHEST 2V
2 series · 2 of 2 positions shown · non-contrast
Comparison: 02/22/2017

CLINICAL DATA: Postop from aortic valve replacement.

EXAM:
CHEST  2 VIEW

[chest lat]
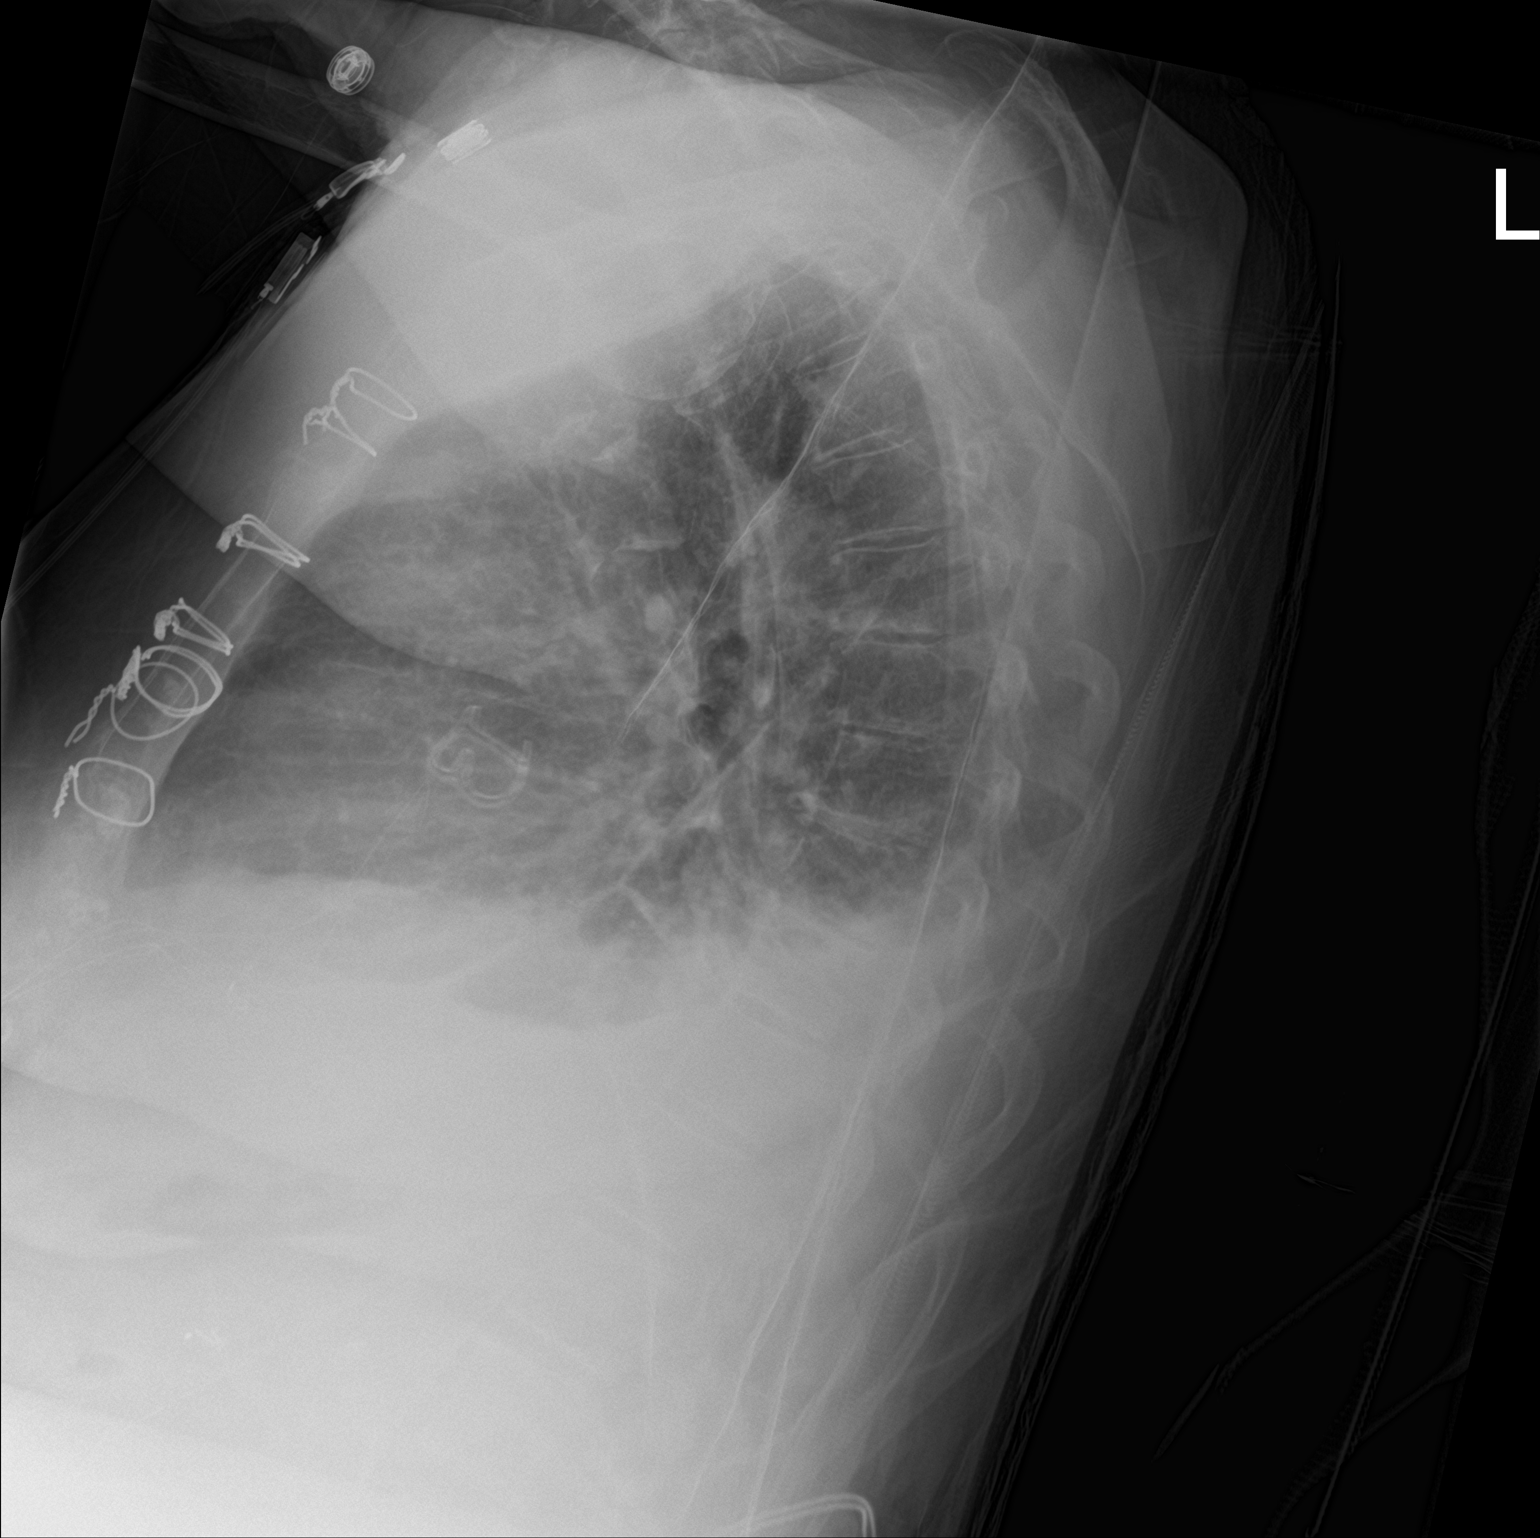

[chest ap]
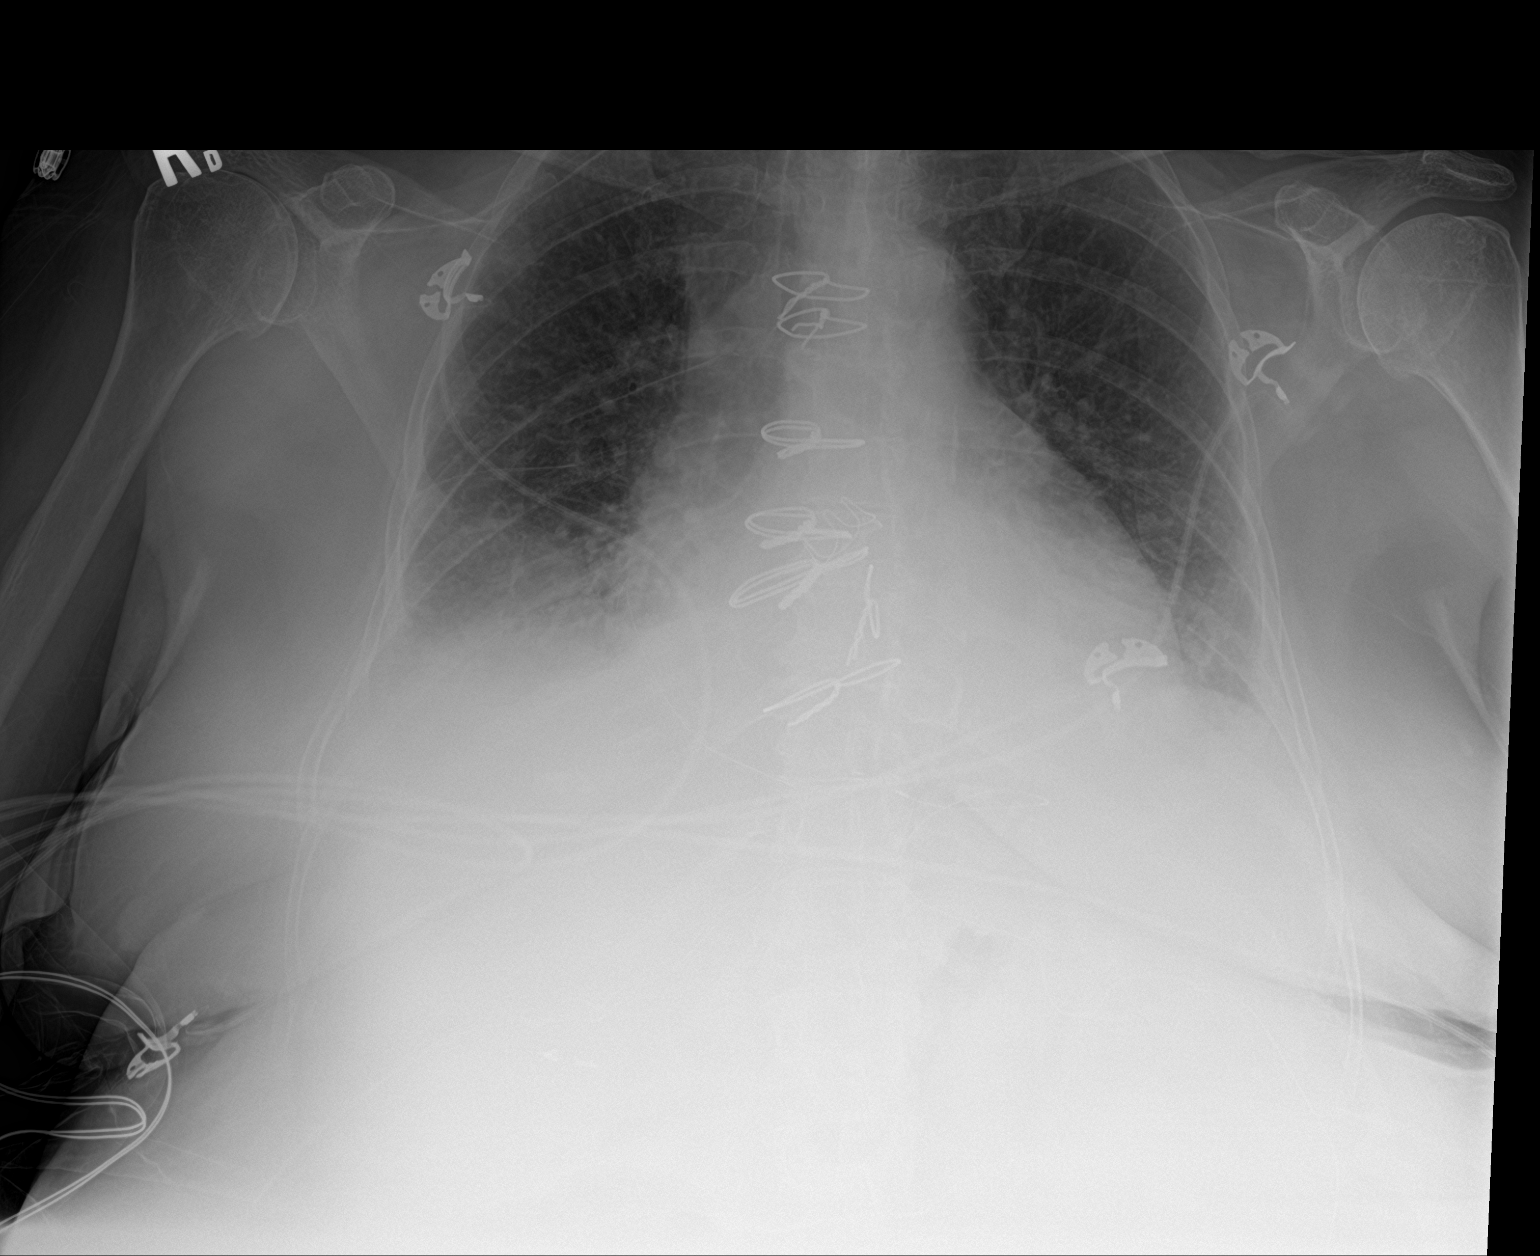

[2 of 2 positions shown; findings below may reference images not displayed]

FINDINGS: Stable mild cardiomegaly. Stable diffuse interstitial infiltrates,
consistent with mild interstitial edema. Stable bibasilar
atelectasis and small bilateral pleural effusions.
IMPRESSION: No significant change and mild interstitial edema, small bilateral
pleural effusions, and bibasilar atelectasis.

## 2017-12-15 DIAGNOSIS — E113213 Type 2 diabetes mellitus with mild nonproliferative diabetic retinopathy with macular edema, bilateral: Secondary | ICD-10-CM | POA: Diagnosis not present

## 2017-12-19 DIAGNOSIS — E119 Type 2 diabetes mellitus without complications: Secondary | ICD-10-CM | POA: Diagnosis not present

## 2017-12-19 DIAGNOSIS — K219 Gastro-esophageal reflux disease without esophagitis: Secondary | ICD-10-CM | POA: Diagnosis not present

## 2017-12-19 DIAGNOSIS — F411 Generalized anxiety disorder: Secondary | ICD-10-CM | POA: Diagnosis not present

## 2017-12-19 DIAGNOSIS — I10 Essential (primary) hypertension: Secondary | ICD-10-CM | POA: Diagnosis not present

## 2018-01-05 DIAGNOSIS — E113213 Type 2 diabetes mellitus with mild nonproliferative diabetic retinopathy with macular edema, bilateral: Secondary | ICD-10-CM | POA: Diagnosis not present

## 2018-02-06 DIAGNOSIS — F411 Generalized anxiety disorder: Secondary | ICD-10-CM | POA: Diagnosis not present

## 2018-02-06 DIAGNOSIS — L308 Other specified dermatitis: Secondary | ICD-10-CM | POA: Diagnosis not present

## 2018-02-06 DIAGNOSIS — S43429A Sprain of unspecified rotator cuff capsule, initial encounter: Secondary | ICD-10-CM | POA: Diagnosis not present

## 2018-02-06 DIAGNOSIS — R0602 Shortness of breath: Secondary | ICD-10-CM | POA: Diagnosis not present

## 2018-02-06 DIAGNOSIS — R079 Chest pain, unspecified: Secondary | ICD-10-CM | POA: Diagnosis not present

## 2018-02-16 DIAGNOSIS — E113213 Type 2 diabetes mellitus with mild nonproliferative diabetic retinopathy with macular edema, bilateral: Secondary | ICD-10-CM | POA: Diagnosis not present

## 2018-02-17 ENCOUNTER — Ambulatory Visit: Admit: 2018-02-17 | Payer: Medicare Other | Admitting: Ophthalmology

## 2018-02-17 SURGERY — PHACOEMULSIFICATION, CATARACT, WITH IOL INSERTION
Anesthesia: Choice | Laterality: Left

## 2018-03-09 DIAGNOSIS — F411 Generalized anxiety disorder: Secondary | ICD-10-CM | POA: Diagnosis not present

## 2018-03-09 DIAGNOSIS — S43429A Sprain of unspecified rotator cuff capsule, initial encounter: Secondary | ICD-10-CM | POA: Diagnosis not present

## 2018-03-09 DIAGNOSIS — R0602 Shortness of breath: Secondary | ICD-10-CM | POA: Diagnosis not present

## 2018-03-09 DIAGNOSIS — E669 Obesity, unspecified: Secondary | ICD-10-CM | POA: Diagnosis not present

## 2018-03-19 DIAGNOSIS — F411 Generalized anxiety disorder: Secondary | ICD-10-CM | POA: Diagnosis not present

## 2018-03-19 DIAGNOSIS — R079 Chest pain, unspecified: Secondary | ICD-10-CM | POA: Diagnosis not present

## 2018-03-19 DIAGNOSIS — R0602 Shortness of breath: Secondary | ICD-10-CM | POA: Diagnosis not present

## 2018-03-19 DIAGNOSIS — E669 Obesity, unspecified: Secondary | ICD-10-CM | POA: Diagnosis not present

## 2018-03-19 DIAGNOSIS — S43429A Sprain of unspecified rotator cuff capsule, initial encounter: Secondary | ICD-10-CM | POA: Diagnosis not present

## 2018-04-13 DIAGNOSIS — E113213 Type 2 diabetes mellitus with mild nonproliferative diabetic retinopathy with macular edema, bilateral: Secondary | ICD-10-CM | POA: Diagnosis not present

## 2018-04-16 DIAGNOSIS — I10 Essential (primary) hypertension: Secondary | ICD-10-CM | POA: Diagnosis not present

## 2018-04-16 DIAGNOSIS — K219 Gastro-esophageal reflux disease without esophagitis: Secondary | ICD-10-CM | POA: Diagnosis not present

## 2018-04-16 DIAGNOSIS — M354 Diffuse (eosinophilic) fasciitis: Secondary | ICD-10-CM | POA: Diagnosis not present

## 2018-04-16 DIAGNOSIS — R5381 Other malaise: Secondary | ICD-10-CM | POA: Diagnosis not present

## 2018-04-16 DIAGNOSIS — E7849 Other hyperlipidemia: Secondary | ICD-10-CM | POA: Diagnosis not present

## 2018-04-16 DIAGNOSIS — Z0000 Encounter for general adult medical examination without abnormal findings: Secondary | ICD-10-CM | POA: Diagnosis not present

## 2018-04-16 DIAGNOSIS — E034 Atrophy of thyroid (acquired): Secondary | ICD-10-CM | POA: Diagnosis not present

## 2018-04-16 DIAGNOSIS — E119 Type 2 diabetes mellitus without complications: Secondary | ICD-10-CM | POA: Diagnosis not present

## 2018-04-17 ENCOUNTER — Other Ambulatory Visit: Payer: Self-pay | Admitting: Internal Medicine

## 2018-04-17 DIAGNOSIS — Z1231 Encounter for screening mammogram for malignant neoplasm of breast: Secondary | ICD-10-CM

## 2018-04-21 ENCOUNTER — Telehealth: Payer: Self-pay | Admitting: Cardiovascular Disease

## 2018-04-21 NOTE — Telephone Encounter (Signed)
Patient declined appt.  Seeing pcp masoud for refills and cardiology.  Patient states she had a cath in 2018 and a radial nerve was cut and she still has issues with it.  Patient does not intend to do anything about this but just wants someone to know.

## 2018-04-21 NOTE — Telephone Encounter (Signed)
-----   Message from Festus Aloe, CMA sent at 04/21/2018  3:36 PM EDT ----- Patient requesting refill on medications Patient was last seen in 2018.  Please contact patient for a follow up appointment.   Thanks, Jasmine December

## 2018-04-27 DIAGNOSIS — H2512 Age-related nuclear cataract, left eye: Secondary | ICD-10-CM | POA: Diagnosis not present

## 2018-04-27 DIAGNOSIS — E113213 Type 2 diabetes mellitus with mild nonproliferative diabetic retinopathy with macular edema, bilateral: Secondary | ICD-10-CM | POA: Diagnosis not present

## 2018-04-28 NOTE — Telephone Encounter (Signed)
Error

## 2018-04-29 ENCOUNTER — Other Ambulatory Visit: Payer: Self-pay

## 2018-04-29 ENCOUNTER — Emergency Department: Payer: Medicare Other

## 2018-04-29 ENCOUNTER — Emergency Department
Admission: EM | Admit: 2018-04-29 | Discharge: 2018-04-29 | Disposition: A | Discharge status: Home / Self Care | Payer: Medicare Other | Attending: Emergency Medicine | Admitting: Emergency Medicine

## 2018-04-29 DIAGNOSIS — I2510 Atherosclerotic heart disease of native coronary artery without angina pectoris: Secondary | ICD-10-CM | POA: Insufficient documentation

## 2018-04-29 DIAGNOSIS — S82202A Unspecified fracture of shaft of left tibia, initial encounter for closed fracture: Secondary | ICD-10-CM

## 2018-04-29 DIAGNOSIS — Y999 Unspecified external cause status: Secondary | ICD-10-CM | POA: Diagnosis not present

## 2018-04-29 DIAGNOSIS — Z7984 Long term (current) use of oral hypoglycemic drugs: Secondary | ICD-10-CM | POA: Insufficient documentation

## 2018-04-29 DIAGNOSIS — E119 Type 2 diabetes mellitus without complications: Secondary | ICD-10-CM | POA: Insufficient documentation

## 2018-04-29 DIAGNOSIS — W010XXA Fall on same level from slipping, tripping and stumbling without subsequent striking against object, initial encounter: Secondary | ICD-10-CM | POA: Diagnosis not present

## 2018-04-29 DIAGNOSIS — S82302A Unspecified fracture of lower end of left tibia, initial encounter for closed fracture: Secondary | ICD-10-CM | POA: Insufficient documentation

## 2018-04-29 DIAGNOSIS — Y929 Unspecified place or not applicable: Secondary | ICD-10-CM | POA: Diagnosis not present

## 2018-04-29 DIAGNOSIS — M25579 Pain in unspecified ankle and joints of unspecified foot: Secondary | ICD-10-CM

## 2018-04-29 DIAGNOSIS — I447 Left bundle-branch block, unspecified: Secondary | ICD-10-CM | POA: Diagnosis not present

## 2018-04-29 DIAGNOSIS — Z7982 Long term (current) use of aspirin: Secondary | ICD-10-CM | POA: Diagnosis not present

## 2018-04-29 DIAGNOSIS — Y939 Activity, unspecified: Secondary | ICD-10-CM | POA: Diagnosis not present

## 2018-04-29 DIAGNOSIS — S99912A Unspecified injury of left ankle, initial encounter: Secondary | ICD-10-CM | POA: Diagnosis present

## 2018-04-29 DIAGNOSIS — S82402A Unspecified fracture of shaft of left fibula, initial encounter for closed fracture: Secondary | ICD-10-CM

## 2018-04-29 DIAGNOSIS — E039 Hypothyroidism, unspecified: Secondary | ICD-10-CM | POA: Insufficient documentation

## 2018-04-29 DIAGNOSIS — I5022 Chronic systolic (congestive) heart failure: Secondary | ICD-10-CM | POA: Diagnosis not present

## 2018-04-29 DIAGNOSIS — Z8673 Personal history of transient ischemic attack (TIA), and cerebral infarction without residual deficits: Secondary | ICD-10-CM | POA: Diagnosis not present

## 2018-04-29 DIAGNOSIS — S82432A Displaced oblique fracture of shaft of left fibula, initial encounter for closed fracture: Secondary | ICD-10-CM | POA: Diagnosis not present

## 2018-04-29 DIAGNOSIS — S89392A Other physeal fracture of lower end of left fibula, initial encounter for closed fracture: Secondary | ICD-10-CM | POA: Diagnosis not present

## 2018-04-29 DIAGNOSIS — S89192A Other physeal fracture of lower end of left tibia, initial encounter for closed fracture: Secondary | ICD-10-CM | POA: Diagnosis not present

## 2018-04-29 DIAGNOSIS — Z79899 Other long term (current) drug therapy: Secondary | ICD-10-CM | POA: Diagnosis not present

## 2018-04-29 DIAGNOSIS — Z87891 Personal history of nicotine dependence: Secondary | ICD-10-CM | POA: Diagnosis not present

## 2018-04-29 DIAGNOSIS — S82232A Displaced oblique fracture of shaft of left tibia, initial encounter for closed fracture: Secondary | ICD-10-CM | POA: Diagnosis not present

## 2018-04-29 MED ORDER — OXYCODONE-ACETAMINOPHEN 5-325 MG PO TABS: 1.0000 | ORAL_TABLET | ORAL | 0 refills | Status: DC | PRN

## 2018-04-29 NOTE — ED Provider Notes (Addendum)
[facility] Emergency Department Provider Note ____________________________________________   I have reviewed the triage vital signs and the triage nursing note.  HISTORY  Chief Complaint Ankle Pain   Historian Patient  HPI Jenny [name] is a 72 y.o. female arrived by EMS after trip and fall twisting the left ankle.  She is a history of prior left ankle fracture.  Patient states that she heard a snap and she thinks it broken.  She does have sensation.  Pain is minimal at rest but if she moves at all at severe.  No other injuries.  No head injury, no neck injury, and no other extremity injuries.     Past Medical History:  Diagnosis Date  . Anxiety   . Arthritis   . CAD (coronary artery disease)    a. 01/2017 Cath: nonobs dzs.  . Chronic systolic CHF (congestive heart failure) (HCC)    a. TTE 12/2016, EF 45-50%, probable hypokinesis of the mid apical anteroseptal, anterior, & apical myocardium, GR2DD, possibly bicuspid aortic valve that was moderately thickened w/ severely calcified leaflets, severe aortic stenosis w/ mean gradient 47 mmHg, valve area 0.52 cm, mild MR, mildly dilated LA  . Depression   . Diabetes mellitus with complication (HCC)    Tylenol  . Hyperlipidemia   . Hypothyroidism   . IBS (irritable bowel syndrome)   . Iron deficiency anemia    a. s/p pRBC x1 in 12/2016  . Microcytic anemia   . Panic attacks   . Severe aortic stenosis    a. TTE 12/2016: possibly bicuspid aortic valve that was moderately thickened with severely calcified leaflets. There was severe aortic stenosis with a mean gradient of 47 mmHg and valve area of 0.52 cm; b. 01/2017 s/p AVR w/ 19 mm bioprosthetic Magna Ease pericardial tissue valve, ser# [id number].  . Stroke (HCC) 2001  . TIA (transient ischemic attack) 2006    Patient Active Problem List   Diagnosis Date Noted  . S/P AVR (aortic valve replacement) 02/17/2017  . Diabetes (HCC) 01/29/2017  . Depression  01/29/2017  . Severe aortic stenosis 01/22/2017  . Chronic systolic CHF (congestive heart failure) (HCC) 01/22/2017  . Anemia 01/16/2017  . Acute CHF (congestive heart failure) (HCC) 01/16/2017  . Elevated troponin 01/16/2017  . Hypokalemia 01/16/2017  . Chest pain 01/15/2017    Past Surgical History:  Procedure Laterality Date  . ABDOMINAL HYSTERECTOMY    . AORTIC VALVE REPLACEMENT N/A 02/17/2017   Procedure: AORTIC VALVE REPLACEMENT (AVR);  Surgeon: Donata Clay, Theron Arista, MD;  Location[facility] OR;  Service: Open Heart Surgery;  Laterality: N/A;  . CATARACT EXTRACTION W/PHACO Right 04/09/2016   Procedure: CATARACT EXTRACTION PHACO AND INTRAOCULAR LENS PLACEMENT (IOC);  Surgeon: Galen Manila, MD;  Location: [facility] ORS;  Service: Ophthalmology;  Laterality: Right;  [place] 57.2 AP% 18.6 CDE 10.64 Fluid Pack lot # [id number]  . CHOLECYSTECTOMY    . COLONOSCOPY    . CORONARY ANGIOGRAPHY N/A 02/03/2017   Procedure: CORONARY ANGIOGRAPHY;  Surgeon: Iran Ouch, MD;  Location: [facility] INVASIVE CV LAB;  Service: Cardiovascular;  Laterality: N/A;  . FRACTURE SURGERY     LEFT ANKLE  . RIGHT AND LEFT HEART CATH Bilateral 02/03/2017   Procedure: Right and Left Heart Cath;  Surgeon: Iran Ouch, MD;  Location: [facility] INVASIVE CV LAB;  Service: Cardiovascular;  Laterality: Bilateral;  . TEE WITHOUT CARDIOVERSION N/A 02/17/2017   Procedure: TRANSESOPHAGEAL ECHOCARDIOGRAM (TEE);  Surgeon: Donata Clay, Theron Arista, MD;  Location: [facility] OR;  Service:  Open Heart Surgery;  Laterality: N/A;    Prior to Admission medications   Medication Sig Start Date End Date Taking? Authorizing Provider  ALPRAZolam Prudy Feeler) 0.5 MG tablet Take 0.5 mg by mouth 2 (two) times daily.    Yes [provider]  aspirin EC 81 MG tablet Take 81 mg by mouth daily.   Yes [provider]  hydrOXYzine (ATARAX/VISTARIL) 25 MG tablet Take 25 mg by mouth 2 (two) times daily.    Yes [provider]  insulin glargine (LANTUS) 100  UNIT/ML injection Inject 25-35 Units into the skin See admin instructions. 35 units every morning and 25 units at bedtime   Yes [provider]  levothyroxine (SYNTHROID, LEVOTHROID) 88 MCG tablet Take 88 mcg by mouth daily.   Yes [provider]  metFORMIN (GLUCOPHAGE) 500 MG tablet Take 500 mg by mouth 2 (two) times daily.   Yes [provider]  rosuvastatin (CRESTOR) 10 MG tablet Take 1 tablet (10 mg total) by mouth daily. 03/12/17 04/29/18 Yes Creig Hines, NP  sertraline (ZOLOFT) 100 MG tablet Take 100 mg by mouth 2 (two) times daily.   Yes [provider]  sitaGLIPtin (JANUVIA) 100 MG tablet Take 100 mg by mouth daily.   Yes [provider]    Allergies  Allergen Reactions  . Iodine Swelling    ANGIOEDEMA FACIAL SWELLING "BETADINE OKAY"  . Shellfish Allergy Anaphylaxis    Family History  Problem Relation Age of Onset  . Hypertension Mother     Social History Social History   Tobacco Use  . Smoking status: Former Smoker    Years: 30.00    Last attempt to quit: 02/04/2000    Years since quitting: 18.2  . Smokeless tobacco: Never Used  Substance Use Topics  . Alcohol use: No  . Drug use: No    Review of Systems  Constitutional: Negative for fever. Eyes: Negative for visual changes. ENT: Negative for sore throat. Cardiovascular: Negative for chest pain. Respiratory: Negative for shortness of breath. Gastrointestinal: Negative for abdominal pain, vomiting and diarrhea. Genitourinary: Negative for dysuria. Musculoskeletal: Positive for left ankle pain as per HPI Skin: Negative for rash. Neurological: Negative for headache.  ____________________________________________   PHYSICAL EXAM:  VITAL SIGNS: ED Triage Vitals  Enc Vitals Group     BP 04/29/18 1335 140/68     Pulse Rate 04/29/18 1335 83     Resp 04/29/18 1335 18     Temp 04/29/18 1335 98 F (36.7 C)     Temp Source 04/29/18 1335 Oral     SpO2  04/29/18 1335 98 %     Weight 04/29/18 1336 162 lb (73.5 kg)     Height 04/29/18 1336 4\' 11"  (1.499 m)     Head Circumference --      Peak Flow --      Pain Score 04/29/18 1336 4     Pain Loc --      Pain Edu? --      Excl. in GC? --      Constitutional: Alert and oriented.  HEENT      Head: Normocephalic and atraumatic.      Eyes: Conjunctivae are normal. Pupils equal and round.       Ears:         Nose: No congestion/rhinnorhea.      Mouth/Throat: Mucous membranes are moist.      Neck: No stridor. Cardiovascular/Chest: Normal rate, regular rhythm.  No murmurs, rubs, or gallops. Respiratory:  Normal respiratory effort without tachypnea nor retractions. Breath sounds are clear and equal bilaterally. No wheezes/rales/rhonchi. Gastrointestinal: Soft. No distention, no guarding, no rebound. Nontender.    Genitourinary/rectal:Deferred Musculoskeletal: Pelvis stable.  Left ankle swelling and deformity distal left ankle.  Tender to palpation.  Neurovascularly intact. Neurologic:  Normal speech and language. No gross or focal neurologic deficits are appreciated. Skin:  Skin is warm, dry and intact. No rash noted. Psychiatric: Mood and affect are normal. Speech and behavior are normal. Patient exhibits appropriate insight and judgment.   ____________________________________________  LABS (pertinent positives/negatives) I, Governor Rooks, MD the attending physician have reviewed the labs noted below.  Labs Reviewed - No data to display  ____________________________________________    EKG I, Governor Rooks, MD, the attending physician have personally viewed and interpreted all ECGs.  84 bpm.  Normal sinus rhythm.  Right bundle branch block.  Nonspecific T wave. ____________________________________________  RADIOLOGY  Left tib-fib x-ray reviewed by me and interpreted by radiologist:  IMPRESSION: 1. Acute obliquely oriented and mildly comminuted fracture through the distal tibial  diaphysis. 2. Acute obliquely oriented fracture through the mid fibular diaphysis. 3. Surgical changes of prior ORIF of now healed distal fibular fracture and medial malleolus fracture without evidence of hardware complication. 4. Atherosclerotic vascular calcifications.  _________________________________________  PROCEDURES  Procedure(s) performed: Splint left lower extremity, short leg posterior and stirrup.  Placed by tech.  Neurovascularly intact before and after checked by myself.  Procedures  Critical Care performed: None   ____________________________________________  ED COURSE / ASSESSMENT AND PLAN  Pertinent labs & imaging results that were available during my care of the patient were reviewed by me and considered in my medical decision making (see chart for details).    Patient had a mechanical fall, no other medical complications such as syncope suspected.  Isolated injury to the left ankle, concerning for fracture clinically.  Confirmed by x-ray to be distal spiral tibia fracture.  Discussed with Dr. Rosita Kea, Who recommended patient okay for discharge from the emergency department today after stirrup splint, follow-up with his office or Dr. Real Cons as patient elects, with likely surgery next week.   Pain is not that bad without moving it and she does have fentanyl at home.   CONSULTATIONS:   Orthopedic surgery, Dr. Rosita Kea, recommends stirrup splint and close outpatient follow up with likely delayed surgery - next week.  Can see Dr. Martha Clan by her choice or follow up with him.   Patient / Family / Caregiver informed of clinical course, medical decision-making process, and agree with plan.   I discussed return precautions, follow-up instructions, and discharge instructions with patient and/or family.  Discharge Instructions : Keep splint in place until seen in follow-up.  Do not put any weight on that leg/foot.  Return to the emergency room immediately for any  worsening or uncontrolled pain, numbness or tingling, or any other symptoms concerning to you.    ___________________________________________   FINAL CLINICAL IMPRESSION(S) / ED DIAGNOSES   Final diagnoses:  Closed fracture of distal end of left tibia, unspecified fracture morphology, initial encounter  Tibia/fibula fracture, left, closed, initial encounter      ___________________________________________         Note: This dictation was prepared with Dragon dictation. Any transcriptional errors that result from this process are unintentional    Governor Rooks, MD 04/29/18 1451    Governor Rooks, MD 04/29/18 1452

## 2018-04-29 NOTE — Discharge Instructions (Addendum)
Keep splint in place until seen in follow-up.  Do not put any weight on that leg/foot.  Return to the emergency room immediately for any worsening or uncontrolled pain, numbness or tingling, or any other symptoms concerning to you.

## 2018-04-29 NOTE — ED Provider Notes (Signed)
POST SPLINT CHECK Left lower extremity splint applied by tech.  Good position.  Distally N/V intact, sensation intact. No discoloration.  Discussed with patient importance of orthopedic follow-up.  Discussed with patient that we would be prescribing pain medication given fractures.  Discussed sedating aspects of the pain medication.   Phineas Semen, MD 04/29/18 [phone]

## 2018-04-29 NOTE — ED Notes (Signed)
Splint applied to LLE by EDT.

## 2018-04-29 NOTE — ED Triage Notes (Addendum)
Pt arrived via ems from home due to fall. Pt was fixing up her bed and twisted her left ankle and heard it snap. Pt A&Ox4 NAD but c/o pain. Respirations even and non labored. Pt has hx of left ankle surgery 2 years ago and aortic valve repair Aug. 2018.

## 2018-05-04 DIAGNOSIS — S82855A Nondisplaced trimalleolar fracture of left lower leg, initial encounter for closed fracture: Secondary | ICD-10-CM | POA: Diagnosis not present

## 2018-05-15 DIAGNOSIS — S82852D Displaced trimalleolar fracture of left lower leg, subsequent encounter for closed fracture with routine healing: Secondary | ICD-10-CM | POA: Diagnosis not present

## 2018-05-18 ENCOUNTER — Other Ambulatory Visit: Payer: Self-pay | Admitting: Orthopedic Surgery

## 2018-05-18 DIAGNOSIS — M354 Diffuse (eosinophilic) fasciitis: Secondary | ICD-10-CM | POA: Diagnosis not present

## 2018-05-18 DIAGNOSIS — K219 Gastro-esophageal reflux disease without esophagitis: Secondary | ICD-10-CM | POA: Diagnosis not present

## 2018-05-18 DIAGNOSIS — Z01818 Encounter for other preprocedural examination: Secondary | ICD-10-CM | POA: Diagnosis not present

## 2018-05-18 DIAGNOSIS — E119 Type 2 diabetes mellitus without complications: Secondary | ICD-10-CM | POA: Diagnosis not present

## 2018-05-18 DIAGNOSIS — I10 Essential (primary) hypertension: Secondary | ICD-10-CM | POA: Diagnosis not present

## 2018-05-18 MED ORDER — CEFAZOLIN SODIUM-DEXTROSE 2-4 GM/100ML-% IV SOLN
2.0000 g | INTRAVENOUS | Status: AC
  Administered 2018-05-19: 2 g via INTRAVENOUS

## 2018-05-19 ENCOUNTER — Encounter
Admission: RE | Disposition: A | Discharge status: Skilled Nursing Facility | Payer: Self-pay | Source: Home / Self Care | Attending: Orthopedic Surgery

## 2018-05-19 ENCOUNTER — Ambulatory Visit: Payer: Medicare Other

## 2018-05-19 ENCOUNTER — Other Ambulatory Visit: Payer: Self-pay

## 2018-05-19 ENCOUNTER — Ambulatory Visit: Payer: Medicare Other | Admitting: Certified Registered"

## 2018-05-19 ENCOUNTER — Inpatient Hospital Stay: Payer: Medicare Other

## 2018-05-19 ENCOUNTER — Inpatient Hospital Stay
Admission: RE | Admit: 2018-05-19 | Discharge: 2018-05-22 | DRG: 493 | Disposition: A | Discharge status: Skilled Nursing Facility | Payer: Medicare Other | Attending: Orthopedic Surgery | Admitting: Orthopedic Surgery

## 2018-05-19 DIAGNOSIS — Z7401 Bed confinement status: Secondary | ICD-10-CM | POA: Diagnosis not present

## 2018-05-19 DIAGNOSIS — Z8673 Personal history of transient ischemic attack (TIA), and cerebral infarction without residual deficits: Secondary | ICD-10-CM | POA: Diagnosis not present

## 2018-05-19 DIAGNOSIS — E7849 Other hyperlipidemia: Secondary | ICD-10-CM | POA: Diagnosis not present

## 2018-05-19 DIAGNOSIS — Z23 Encounter for immunization: Secondary | ICD-10-CM | POA: Diagnosis not present

## 2018-05-19 DIAGNOSIS — Z87891 Personal history of nicotine dependence: Secondary | ICD-10-CM

## 2018-05-19 DIAGNOSIS — Z90710 Acquired absence of both cervix and uterus: Secondary | ICD-10-CM | POA: Diagnosis not present

## 2018-05-19 DIAGNOSIS — S82232A Displaced oblique fracture of shaft of left tibia, initial encounter for closed fracture: Secondary | ICD-10-CM | POA: Diagnosis not present

## 2018-05-19 DIAGNOSIS — S72142D Displaced intertrochanteric fracture of left femur, subsequent encounter for closed fracture with routine healing: Secondary | ICD-10-CM | POA: Diagnosis not present

## 2018-05-19 DIAGNOSIS — Z9049 Acquired absence of other specified parts of digestive tract: Secondary | ICD-10-CM

## 2018-05-19 DIAGNOSIS — E119 Type 2 diabetes mellitus without complications: Secondary | ICD-10-CM | POA: Diagnosis present

## 2018-05-19 DIAGNOSIS — S82852A Displaced trimalleolar fracture of left lower leg, initial encounter for closed fracture: Principal | ICD-10-CM | POA: Diagnosis present

## 2018-05-19 DIAGNOSIS — M6281 Muscle weakness (generalized): Secondary | ICD-10-CM | POA: Diagnosis not present

## 2018-05-19 DIAGNOSIS — Z888 Allergy status to other drugs, medicaments and biological substances status: Secondary | ICD-10-CM

## 2018-05-19 DIAGNOSIS — F329 Major depressive disorder, single episode, unspecified: Secondary | ICD-10-CM | POA: Diagnosis present

## 2018-05-19 DIAGNOSIS — Z794 Long term (current) use of insulin: Secondary | ICD-10-CM | POA: Diagnosis not present

## 2018-05-19 DIAGNOSIS — F3289 Other specified depressive episodes: Secondary | ICD-10-CM | POA: Diagnosis not present

## 2018-05-19 DIAGNOSIS — Z91013 Allergy to seafood: Secondary | ICD-10-CM

## 2018-05-19 DIAGNOSIS — K5909 Other constipation: Secondary | ICD-10-CM | POA: Diagnosis not present

## 2018-05-19 DIAGNOSIS — E1165 Type 2 diabetes mellitus with hyperglycemia: Secondary | ICD-10-CM | POA: Diagnosis not present

## 2018-05-19 DIAGNOSIS — W010XXA Fall on same level from slipping, tripping and stumbling without subsequent striking against object, initial encounter: Secondary | ICD-10-CM | POA: Diagnosis not present

## 2018-05-19 DIAGNOSIS — Z79899 Other long term (current) drug therapy: Secondary | ICD-10-CM | POA: Diagnosis not present

## 2018-05-19 DIAGNOSIS — S82402A Unspecified fracture of shaft of left fibula, initial encounter for closed fracture: Secondary | ICD-10-CM

## 2018-05-19 DIAGNOSIS — E785 Hyperlipidemia, unspecified: Secondary | ICD-10-CM | POA: Diagnosis present

## 2018-05-19 DIAGNOSIS — F419 Anxiety disorder, unspecified: Secondary | ICD-10-CM | POA: Diagnosis present

## 2018-05-19 DIAGNOSIS — Z8249 Family history of ischemic heart disease and other diseases of the circulatory system: Secondary | ICD-10-CM | POA: Diagnosis not present

## 2018-05-19 DIAGNOSIS — Z952 Presence of prosthetic heart valve: Secondary | ICD-10-CM | POA: Diagnosis not present

## 2018-05-19 DIAGNOSIS — F064 Anxiety disorder due to known physiological condition: Secondary | ICD-10-CM | POA: Diagnosis not present

## 2018-05-19 DIAGNOSIS — I2510 Atherosclerotic heart disease of native coronary artery without angina pectoris: Secondary | ICD-10-CM | POA: Diagnosis present

## 2018-05-19 DIAGNOSIS — I5022 Chronic systolic (congestive) heart failure: Secondary | ICD-10-CM | POA: Diagnosis present

## 2018-05-19 DIAGNOSIS — Z961 Presence of intraocular lens: Secondary | ICD-10-CM | POA: Diagnosis present

## 2018-05-19 DIAGNOSIS — L299 Pruritus, unspecified: Secondary | ICD-10-CM | POA: Diagnosis not present

## 2018-05-19 DIAGNOSIS — K589 Irritable bowel syndrome without diarrhea: Secondary | ICD-10-CM | POA: Diagnosis present

## 2018-05-19 DIAGNOSIS — Z9841 Cataract extraction status, right eye: Secondary | ICD-10-CM

## 2018-05-19 DIAGNOSIS — G458 Other transient cerebral ischemic attacks and related syndromes: Secondary | ICD-10-CM | POA: Diagnosis not present

## 2018-05-19 DIAGNOSIS — E039 Hypothyroidism, unspecified: Secondary | ICD-10-CM | POA: Diagnosis present

## 2018-05-19 DIAGNOSIS — Z419 Encounter for procedure for purposes other than remedying health state, unspecified: Secondary | ICD-10-CM

## 2018-05-19 DIAGNOSIS — R52 Pain, unspecified: Secondary | ICD-10-CM | POA: Diagnosis not present

## 2018-05-19 DIAGNOSIS — T8484XA Pain due to internal orthopedic prosthetic devices, implants and grafts, initial encounter: Secondary | ICD-10-CM | POA: Diagnosis not present

## 2018-05-19 DIAGNOSIS — S82852D Displaced trimalleolar fracture of left lower leg, subsequent encounter for closed fracture with routine healing: Secondary | ICD-10-CM | POA: Diagnosis not present

## 2018-05-19 DIAGNOSIS — Z79890 Hormone replacement therapy (postmenopausal): Secondary | ICD-10-CM | POA: Diagnosis not present

## 2018-05-19 DIAGNOSIS — Z7982 Long term (current) use of aspirin: Secondary | ICD-10-CM | POA: Diagnosis not present

## 2018-05-19 DIAGNOSIS — S82202A Unspecified fracture of shaft of left tibia, initial encounter for closed fracture: Secondary | ICD-10-CM | POA: Diagnosis present

## 2018-05-19 HISTORY — PX: TIBIA IM NAIL INSERTION: SHX2516

## 2018-05-19 LAB — BASIC METABOLIC PANEL WITH GFR
Anion gap: 9 (ref 5–15)
BUN: 11 mg/dL (ref 8–23)
CO2: 24 mmol/L (ref 22–32)
Calcium: 9.3 mg/dL (ref 8.9–10.3)
Chloride: 105 mmol/L (ref 98–111)
Creatinine, Ser: 0.55 mg/dL (ref 0.44–1.00)
GFR calc Af Amer: >60 mL/min (ref >60)
GFR calc non Af Amer: >60 mL/min (ref >60)
Glucose, Bld: 91 mg/dL (ref 70–99)
Potassium: 3.8 mmol/L (ref 3.5–5.1)
Sodium: 138 mmol/L (ref 135–145)

## 2018-05-19 LAB — GLUCOSE, CAPILLARY
GLUCOSE-CAPILLARY: 145 mg/dL — AB (ref 70–99)
Glucose-Capillary: 81 mg/dL (ref 70–99)
Glucose-Capillary: 180 mg/dL — ABNORMAL HIGH (ref 70–99)
Glucose-Capillary: 254 mg/dL — ABNORMAL HIGH (ref 70–99)

## 2018-05-19 LAB — APTT: aPTT: 35 seconds (ref 24–36)

## 2018-05-19 LAB — CBC WITH DIFFERENTIAL/PLATELET
Abs Immature Granulocytes: 0.01 K/uL (ref 0.00–0.07)
Basophils Absolute: 0 K/uL (ref 0.0–0.1)
Basophils Relative: 0 %
Eosinophils Absolute: 0.2 K/uL (ref 0.0–0.5)
Eosinophils Relative: 4 %
HCT: 35.8 % — ABNORMAL LOW (ref 36.0–46.0)
Hemoglobin: 11.9 g/dL — ABNORMAL LOW (ref 12.0–15.0)
Immature Granulocytes: 0 %
Lymphocytes Relative: 24 %
Lymphs Abs: 1.3 K/uL (ref 0.7–4.0)
MCH: 28 pg (ref 26.0–34.0)
MCHC: 33.2 g/dL (ref 30.0–36.0)
MCV: 84.2 fL (ref 80.0–100.0)
Monocytes Absolute: 0.5 K/uL (ref 0.1–1.0)
Monocytes Relative: 9 %
Neutro Abs: 3.3 K/uL (ref 1.7–7.7)
Neutrophils Relative %: 63 %
Platelets: 121 K/uL — ABNORMAL LOW (ref 150–400)
RBC: 4.25 MIL/uL (ref 3.87–5.11)
RDW: 13.8 % (ref 11.5–15.5)
WBC: 5.3 K/uL (ref 4.0–10.5)
nRBC: 0 % (ref 0.0–0.2)

## 2018-05-19 LAB — PROTIME-INR
INR: 1.14
Prothrombin Time: 14.5 s (ref 11.4–15.2)

## 2018-05-19 SURGERY — INSERTION, INTRAMEDULLARY ROD, TIBIA
Anesthesia: General | Site: Ankle | Laterality: Left

## 2018-05-19 MED ORDER — FENTANYL CITRATE (PF) 100 MCG/2ML IJ SOLN
INTRAMUSCULAR | Status: AC
  Administered 2018-05-19: 50 ug via INTRAVENOUS
  Filled 2018-05-19: qty 2

## 2018-05-19 MED ORDER — FENTANYL CITRATE (PF) 100 MCG/2ML IJ SOLN
INTRAMUSCULAR | Status: DC | PRN
  Administered 2018-05-19 (×6): 50 ug via INTRAVENOUS

## 2018-05-19 MED ORDER — ALPRAZOLAM 0.5 MG PO TABS
0.5000 mg | ORAL_TABLET | Freq: Two times a day (BID) | ORAL | Status: DC
  Administered 2018-05-19 – 2018-05-22 (×6): 0.5 mg via ORAL
  Filled 2018-05-19 (×6): qty 1

## 2018-05-19 MED ORDER — PHENYLEPHRINE HCL 10 MG/ML IJ SOLN
INTRAMUSCULAR | Status: DC | PRN
  Administered 2018-05-19: 50 ug via INTRAVENOUS
  Administered 2018-05-19 (×3): 100 ug via INTRAVENOUS

## 2018-05-19 MED ORDER — LINAGLIPTIN 5 MG PO TABS
5.0000 mg | ORAL_TABLET | Freq: Every day | ORAL | Status: DC
  Administered 2018-05-20 – 2018-05-22 (×3): 5 mg via ORAL
  Filled 2018-05-19 (×4): qty 1

## 2018-05-19 MED ORDER — ENOXAPARIN SODIUM 40 MG/0.4ML ~~LOC~~ SOLN
40.0000 mg | SUBCUTANEOUS | Status: DC
  Administered 2018-05-20 – 2018-05-22 (×3): 40 mg via SUBCUTANEOUS
  Filled 2018-05-19 (×3): qty 0.4

## 2018-05-19 MED ORDER — ROCURONIUM BROMIDE 100 MG/10ML IV SOLN
INTRAVENOUS | Status: DC | PRN
  Administered 2018-05-19 (×2): 10 mg via INTRAVENOUS
  Administered 2018-05-19: 45 mg via INTRAVENOUS

## 2018-05-19 MED ORDER — PNEUMOCOCCAL VAC POLYVALENT 25 MCG/0.5ML IJ INJ
0.5000 mL | INJECTION | INTRAMUSCULAR | Status: AC
  Administered 2018-05-22: 0.5 mL via INTRAMUSCULAR
  Filled 2018-05-19: qty 0.5

## 2018-05-19 MED ORDER — ACETAMINOPHEN 325 MG PO TABS
325.0000 mg | ORAL_TABLET | Freq: Four times a day (QID) | ORAL | Status: DC | PRN
  Administered 2018-05-21: 650 mg via ORAL
  Filled 2018-05-19: qty 2

## 2018-05-19 MED ORDER — ACETAMINOPHEN 10 MG/ML IV SOLN
INTRAVENOUS | Status: AC
  Filled 2018-05-19: qty 100

## 2018-05-19 MED ORDER — HYDROXYZINE HCL 25 MG PO TABS
25.0000 mg | ORAL_TABLET | Freq: Two times a day (BID) | ORAL | Status: DC
  Administered 2018-05-19 – 2018-05-22 (×6): 25 mg via ORAL
  Filled 2018-05-19 (×7): qty 1

## 2018-05-19 MED ORDER — POTASSIUM CHLORIDE IN NACL 20-0.9 MEQ/L-% IV SOLN
INTRAVENOUS | Status: DC
  Administered 2018-05-19 – 2018-05-20 (×2): via INTRAVENOUS
  Filled 2018-05-19 (×8): qty 1000

## 2018-05-19 MED ORDER — BUPIVACAINE HCL (PF) 0.25 % IJ SOLN
INTRAMUSCULAR | Status: DC | PRN
  Administered 2018-05-19: 30 mL

## 2018-05-19 MED ORDER — GABAPENTIN 300 MG PO CAPS
300.0000 mg | ORAL_CAPSULE | Freq: Three times a day (TID) | ORAL | Status: DC
  Administered 2018-05-19 – 2018-05-22 (×9): 300 mg via ORAL
  Filled 2018-05-19 (×9): qty 1

## 2018-05-19 MED ORDER — DOCUSATE SODIUM 100 MG PO CAPS
100.0000 mg | ORAL_CAPSULE | Freq: Two times a day (BID) | ORAL | Status: DC
  Administered 2018-05-21 – 2018-05-22 (×3): 100 mg via ORAL
  Filled 2018-05-19 (×4): qty 1

## 2018-05-19 MED ORDER — BISACODYL 10 MG RE SUPP: 10.0000 mg | Freq: Every day | RECTAL | Status: DC | PRN

## 2018-05-19 MED ORDER — METHOCARBAMOL 1000 MG/10ML IJ SOLN
500.0000 mg | Freq: Four times a day (QID) | INTRAVENOUS | Status: DC | PRN
  Filled 2018-05-19: qty 5

## 2018-05-19 MED ORDER — INSULIN GLARGINE 100 UNIT/ML ~~LOC~~ SOLN
35.0000 [IU] | Freq: Every morning | SUBCUTANEOUS | Status: DC
  Administered 2018-05-20 – 2018-05-21 (×2): 35 [IU] via SUBCUTANEOUS
  Filled 2018-05-19 (×4): qty 0.35

## 2018-05-19 MED ORDER — PROPOFOL 10 MG/ML IV BOLUS
INTRAVENOUS | Status: AC
  Filled 2018-05-19: qty 20

## 2018-05-19 MED ORDER — INSULIN GLARGINE 100 UNIT/ML ~~LOC~~ SOLN
25.0000 [IU] | Freq: Every day | SUBCUTANEOUS | Status: DC
  Administered 2018-05-19 – 2018-05-21 (×3): 25 [IU] via SUBCUTANEOUS
  Filled 2018-05-19 (×4): qty 0.25

## 2018-05-19 MED ORDER — PROPOFOL 10 MG/ML IV BOLUS
INTRAVENOUS | Status: DC | PRN
  Administered 2018-05-19: 30 mg via INTRAVENOUS
  Administered 2018-05-19: 120 mg via INTRAVENOUS

## 2018-05-19 MED ORDER — LEVOTHYROXINE SODIUM 88 MCG PO TABS
88.0000 ug | ORAL_TABLET | Freq: Every day | ORAL | Status: DC
  Administered 2018-05-19 – 2018-05-22 (×3): 88 ug via ORAL
  Filled 2018-05-19 (×3): qty 1

## 2018-05-19 MED ORDER — FENTANYL CITRATE (PF) 100 MCG/2ML IJ SOLN
INTRAMUSCULAR | Status: AC
  Filled 2018-05-19: qty 2

## 2018-05-19 MED ORDER — FERROUS SULFATE 325 (65 FE) MG PO TABS
325.0000 mg | ORAL_TABLET | Freq: Three times a day (TID) | ORAL | Status: DC
  Administered 2018-05-19 – 2018-05-22 (×9): 325 mg via ORAL
  Filled 2018-05-19 (×9): qty 1

## 2018-05-19 MED ORDER — CEFAZOLIN SODIUM-DEXTROSE 2-4 GM/100ML-% IV SOLN
INTRAVENOUS | Status: AC
  Filled 2018-05-19: qty 100

## 2018-05-19 MED ORDER — LIDOCAINE HCL (CARDIAC) PF 100 MG/5ML IV SOSY
PREFILLED_SYRINGE | INTRAVENOUS | Status: DC | PRN
  Administered 2018-05-19: 60 mg via INTRAVENOUS

## 2018-05-19 MED ORDER — ONDANSETRON HCL 4 MG/2ML IJ SOLN
INTRAMUSCULAR | Status: DC | PRN
  Administered 2018-05-19: 4 mg via INTRAVENOUS

## 2018-05-19 MED ORDER — SODIUM CHLORIDE 0.9 % IV SOLN
INTRAVENOUS | Status: DC
  Administered 2018-05-19: 09:00:00 via INTRAVENOUS

## 2018-05-19 MED ORDER — DEXAMETHASONE SODIUM PHOSPHATE 10 MG/ML IJ SOLN
INTRAMUSCULAR | Status: DC | PRN
  Administered 2018-05-19: 5 mg via INTRAVENOUS

## 2018-05-19 MED ORDER — DEXAMETHASONE SODIUM PHOSPHATE 10 MG/ML IJ SOLN
INTRAMUSCULAR | Status: AC
  Filled 2018-05-19: qty 1

## 2018-05-19 MED ORDER — ROCURONIUM BROMIDE 50 MG/5ML IV SOLN
INTRAVENOUS | Status: AC
  Filled 2018-05-19: qty 1

## 2018-05-19 MED ORDER — HYDROMORPHONE HCL 1 MG/ML IJ SOLN: 0.5000 mg | INTRAMUSCULAR | Status: DC | PRN

## 2018-05-19 MED ORDER — INFLUENZA VAC SPLIT HIGH-DOSE 0.5 ML IM SUSY
0.5000 mL | PREFILLED_SYRINGE | INTRAMUSCULAR | Status: DC
  Filled 2018-05-19: qty 0.5

## 2018-05-19 MED ORDER — FENTANYL CITRATE (PF) 100 MCG/2ML IJ SOLN
25.0000 ug | INTRAMUSCULAR | Status: DC | PRN
  Administered 2018-05-19 (×2): 25 ug via INTRAVENOUS
  Administered 2018-05-19: 50 ug via INTRAVENOUS

## 2018-05-19 MED ORDER — MENTHOL 3 MG MT LOZG
1.0000 | LOZENGE | OROMUCOSAL | Status: DC | PRN
  Filled 2018-05-19: qty 9

## 2018-05-19 MED ORDER — PHENOL 1.4 % MT LIQD
1.0000 | OROMUCOSAL | Status: DC | PRN
  Filled 2018-05-19: qty 177

## 2018-05-19 MED ORDER — SUGAMMADEX SODIUM 200 MG/2ML IV SOLN
INTRAVENOUS | Status: DC | PRN
  Administered 2018-05-19: 200 mg via INTRAVENOUS

## 2018-05-19 MED ORDER — BUPIVACAINE HCL (PF) 0.25 % IJ SOLN
INTRAMUSCULAR | Status: AC
  Filled 2018-05-19: qty 30

## 2018-05-19 MED ORDER — POLYETHYLENE GLYCOL 3350 17 G PO PACK
17.0000 g | PACK | Freq: Every day | ORAL | Status: DC | PRN
  Filled 2018-05-19: qty 1

## 2018-05-19 MED ORDER — LIDOCAINE HCL (PF) 2 % IJ SOLN
INTRAMUSCULAR | Status: AC
  Filled 2018-05-19: qty 10

## 2018-05-19 MED ORDER — METFORMIN HCL 500 MG PO TABS
500.0000 mg | ORAL_TABLET | Freq: Two times a day (BID) | ORAL | Status: DC
  Administered 2018-05-19 – 2018-05-22 (×6): 500 mg via ORAL
  Filled 2018-05-19 (×7): qty 1

## 2018-05-19 MED ORDER — ONDANSETRON HCL 4 MG/2ML IJ SOLN: 4.0000 mg | Freq: Once | INTRAMUSCULAR | Status: DC | PRN

## 2018-05-19 MED ORDER — ALUM & MAG HYDROXIDE-SIMETH 200-200-20 MG/5ML PO SUSP
30.0000 mL | ORAL | Status: DC | PRN
  Administered 2018-05-21: 30 mL via ORAL
  Filled 2018-05-19: qty 30

## 2018-05-19 MED ORDER — SODIUM CHLORIDE 0.9 % IV SOLN
INTRAVENOUS | Status: DC | PRN
  Administered 2018-05-19: 10 ug/min via INTRAVENOUS

## 2018-05-19 MED ORDER — ONDANSETRON HCL 4 MG/2ML IJ SOLN
INTRAMUSCULAR | Status: AC
  Filled 2018-05-19: qty 2

## 2018-05-19 MED ORDER — MAGNESIUM CITRATE PO SOLN
1.0000 | Freq: Once | ORAL | Status: DC | PRN
  Filled 2018-05-19: qty 296

## 2018-05-19 MED ORDER — MIDAZOLAM HCL 2 MG/2ML IJ SOLN
INTRAMUSCULAR | Status: DC | PRN
  Administered 2018-05-19: 2 mg via INTRAVENOUS

## 2018-05-19 MED ORDER — METHOCARBAMOL 500 MG PO TABS
500.0000 mg | ORAL_TABLET | Freq: Four times a day (QID) | ORAL | Status: DC | PRN
  Administered 2018-05-20: 500 mg via ORAL
  Filled 2018-05-19: qty 1

## 2018-05-19 MED ORDER — ACETAMINOPHEN 10 MG/ML IV SOLN
INTRAVENOUS | Status: DC | PRN
  Administered 2018-05-19: 1000 mg via INTRAVENOUS

## 2018-05-19 MED ORDER — NEOMYCIN-POLYMYXIN B GU 40-200000 IR SOLN
Status: DC | PRN
  Administered 2018-05-19: 4 mL

## 2018-05-19 MED ORDER — PHENYLEPHRINE HCL 10 MG/ML IJ SOLN
INTRAMUSCULAR | Status: AC
  Filled 2018-05-19: qty 1

## 2018-05-19 MED ORDER — ACETAMINOPHEN 500 MG PO TABS
1000.0000 mg | ORAL_TABLET | Freq: Four times a day (QID) | ORAL | Status: AC
  Administered 2018-05-19 – 2018-05-20 (×4): 1000 mg via ORAL
  Filled 2018-05-19 (×4): qty 2

## 2018-05-19 MED ORDER — OXYCODONE HCL 5 MG PO TABS
5.0000 mg | ORAL_TABLET | ORAL | Status: DC | PRN
  Administered 2018-05-19: 10 mg via ORAL
  Administered 2018-05-21 – 2018-05-22 (×2): 5 mg via ORAL
  Filled 2018-05-19: qty 1
  Filled 2018-05-19: qty 2
  Filled 2018-05-19: qty 1

## 2018-05-19 MED ORDER — CHLORHEXIDINE GLUCONATE CLOTH 2 % EX PADS: 6.0000 | MEDICATED_PAD | Freq: Once | CUTANEOUS | Status: DC

## 2018-05-19 MED ORDER — ONDANSETRON HCL 4 MG PO TABS: 4.0000 mg | ORAL_TABLET | Freq: Four times a day (QID) | ORAL | Status: DC | PRN

## 2018-05-19 MED ORDER — SUGAMMADEX SODIUM 200 MG/2ML IV SOLN
INTRAVENOUS | Status: AC
  Filled 2018-05-19: qty 2

## 2018-05-19 MED ORDER — SERTRALINE HCL 50 MG PO TABS
100.0000 mg | ORAL_TABLET | Freq: Two times a day (BID) | ORAL | Status: DC
  Administered 2018-05-19 – 2018-05-22 (×6): 100 mg via ORAL
  Filled 2018-05-19 (×6): qty 2

## 2018-05-19 MED ORDER — TRAMADOL HCL 50 MG PO TABS
50.0000 mg | ORAL_TABLET | Freq: Four times a day (QID) | ORAL | Status: DC
  Administered 2018-05-19 – 2018-05-22 (×9): 50 mg via ORAL
  Filled 2018-05-19 (×9): qty 1

## 2018-05-19 MED ORDER — ROSUVASTATIN CALCIUM 10 MG PO TABS
10.0000 mg | ORAL_TABLET | Freq: Every day | ORAL | Status: DC
  Administered 2018-05-19 – 2018-05-22 (×4): 10 mg via ORAL
  Filled 2018-05-19 (×4): qty 1

## 2018-05-19 MED ORDER — ASPIRIN EC 81 MG PO TBEC
81.0000 mg | DELAYED_RELEASE_TABLET | Freq: Every day | ORAL | Status: DC
  Administered 2018-05-20 – 2018-05-22 (×3): 81 mg via ORAL
  Filled 2018-05-19 (×3): qty 1

## 2018-05-19 MED ORDER — ENOXAPARIN SODIUM 30 MG/0.3ML ~~LOC~~ SOLN: 30.0000 mg | SUBCUTANEOUS | Status: DC

## 2018-05-19 MED ORDER — LACTATED RINGERS IV SOLN
INTRAVENOUS | Status: DC | PRN
  Administered 2018-05-19 (×2): via INTRAVENOUS

## 2018-05-19 MED ORDER — CEFAZOLIN SODIUM-DEXTROSE 1-4 GM/50ML-% IV SOLN
1.0000 g | Freq: Four times a day (QID) | INTRAVENOUS | Status: AC
  Administered 2018-05-19 (×2): 1 g via INTRAVENOUS
  Filled 2018-05-19 (×2): qty 50

## 2018-05-19 MED ORDER — KETOROLAC TROMETHAMINE 15 MG/ML IJ SOLN
7.5000 mg | Freq: Four times a day (QID) | INTRAMUSCULAR | Status: AC
  Administered 2018-05-19 – 2018-05-20 (×4): 7.5 mg via INTRAVENOUS
  Filled 2018-05-19 (×4): qty 1

## 2018-05-19 MED ORDER — OXYCODONE HCL 5 MG PO TABS: 10.0000 mg | ORAL_TABLET | ORAL | Status: DC | PRN

## 2018-05-19 MED ORDER — MIDAZOLAM HCL 2 MG/2ML IJ SOLN
INTRAMUSCULAR | Status: AC
  Filled 2018-05-19: qty 2

## 2018-05-19 MED ORDER — ONDANSETRON HCL 4 MG/2ML IJ SOLN: 4.0000 mg | Freq: Four times a day (QID) | INTRAMUSCULAR | Status: DC | PRN

## 2018-05-19 SURGICAL SUPPLY — 73 items
BANDAGE ELASTIC 4 LF NS (GAUZE/BANDAGES/DRESSINGS) ×3 IMPLANT
BANDAGE ELASTIC 6 LF NS (GAUZE/BANDAGES/DRESSINGS) ×3 IMPLANT
BIT DRILL 3.8X6 NS (BIT) ×3 IMPLANT
BIT DRILL 4.4 NS (BIT) ×2 IMPLANT
BIT DRILL CAL 3.2 LONG (BIT) ×2 IMPLANT
BIT DRILL CAL 3.2MM LONG (BIT) ×1
BLADE SURG 15 STRL LF DISP TIS (BLADE) IMPLANT
BLADE SURG 15 STRL SS (BLADE) ×6
BNDG CMPR MED 5X4 ELC HKLP NS (GAUZE/BANDAGES/DRESSINGS) ×1
BNDG CMPR MED 5X6 ELC HKLP NS (GAUZE/BANDAGES/DRESSINGS) ×1
CANISTER SUCT 1200ML W/VALVE (MISCELLANEOUS) ×3 IMPLANT
CHLORAPREP W/TINT 26ML (MISCELLANEOUS) ×8 IMPLANT
CLOSURE WOUND 1/2 X4 (GAUZE/BANDAGES/DRESSINGS) ×2
COVER WAND RF STERILE (DRAPES) ×1 IMPLANT
CUFF TOURN 24 STER (MISCELLANEOUS) IMPLANT
CUFF TOURN 30 STER DUAL PORT (MISCELLANEOUS) IMPLANT
DRAPE C-ARM XRAY 36X54 (DRAPES) ×3 IMPLANT
DRAPE C-ARMOR (DRAPES) ×3 IMPLANT
DRAPE FLUOR MINI C-ARM 54X84 (DRAPES) ×2 IMPLANT
DRAPE INCISE 23X17 IOBAN STRL (DRAPES) ×2
DRAPE INCISE 23X17 STRL (DRAPES) IMPLANT
DRAPE INCISE IOBAN 23X17 STRL (DRAPES) ×1 IMPLANT
DRAPE SHEET LG 3/4 BI-LAMINATE (DRAPES) ×6 IMPLANT
DURAPREP 26ML APPLICATOR (WOUND CARE) ×2 IMPLANT
ELECT REM PT RETURN 9FT ADLT (ELECTROSURGICAL) ×3
ELECTRODE REM PT RTRN 9FT ADLT (ELECTROSURGICAL) ×1 IMPLANT
GAUZE PETRO XEROFOAM 1X8 (MISCELLANEOUS) ×5 IMPLANT
GAUZE SPONGE 4X4 12PLY STRL (GAUZE/BANDAGES/DRESSINGS) ×6 IMPLANT
GLOVE BIOGEL PI IND STRL 7.5 (GLOVE) IMPLANT
GLOVE BIOGEL PI IND STRL 9 (GLOVE) ×2 IMPLANT
GLOVE BIOGEL PI INDICATOR 7.5 (GLOVE) ×8
GLOVE BIOGEL PI INDICATOR 9 (GLOVE) ×4
GLOVE SURG 9.0 ORTHO LTXF (GLOVE) ×6 IMPLANT
GOWN STRL REUS TWL 2XL XL LVL4 (GOWN DISPOSABLE) ×3 IMPLANT
GOWN STRL REUS W/ TWL LRG LVL3 (GOWN DISPOSABLE) ×1 IMPLANT
GOWN STRL REUS W/TWL LRG LVL3 (GOWN DISPOSABLE) ×3
GUIDEPIN 3.2X17.5 THRD DISP (PIN) ×2 IMPLANT
GUIDEWIRE BALL NOSE 100CM (WIRE) ×3 IMPLANT
HEMOVAC 400CC 10FR (MISCELLANEOUS) ×3 IMPLANT
HOLDER FOLEY CATH W/STRAP (MISCELLANEOUS) ×2 IMPLANT
KIT TURNOVER KIT A (KITS) ×3 IMPLANT
MAT ABSORB  FLUID 56X50 GRAY (MISCELLANEOUS)
MAT ABSORB FLUID 56X50 GRAY (MISCELLANEOUS) ×1 IMPLANT
NAIL TIBIA 10X28.5 (Nail) ×2 IMPLANT
NEEDLE FILTER BLUNT 18X 1/2SAF (NEEDLE) ×2
NEEDLE FILTER BLUNT 18X1 1/2 (NEEDLE) ×1 IMPLANT
NEEDLE HYPO 22GX1.5 SAFETY (NEEDLE) ×3 IMPLANT
NS IRRIG 1000ML POUR BTL (IV SOLUTION) ×3 IMPLANT
PACK TOTAL KNEE (MISCELLANEOUS) ×3 IMPLANT
PADDING CAST 6X4YD NS (MISCELLANEOUS) ×4
PADDING CAST BLEND 4X4 NS (MISCELLANEOUS) ×6 IMPLANT
PADDING CAST COTTON 6X4 NS (MISCELLANEOUS) ×2 IMPLANT
REAMER ROD DEEP FLUTE 2.5X950 (INSTRUMENTS) ×3 IMPLANT
SCREW ACECAP 32MM (Screw) ×3 IMPLANT
SCREW ACECAP 34MM (Screw) ×2 IMPLANT
SCREW ACECAP 36MM (Screw) ×2 IMPLANT
SCREW ACECAP 44MM (Screw) ×2 IMPLANT
SCREW LCKING CORT 5.5X30 NSTRL (Screw) ×2 IMPLANT
SCREW PROX CORT 5.5X25 NSTRL (Screw) ×2 IMPLANT
SPLINT CAST 1 STEP 4X30 (MISCELLANEOUS) ×6 IMPLANT
SPLINT CAST 1 STEP 5X30 WHT (MISCELLANEOUS) ×2 IMPLANT
STAPLER SKIN PROX 35W (STAPLE) ×3 IMPLANT
STRIP CLOSURE SKIN 1/2X4 (GAUZE/BANDAGES/DRESSINGS) ×2 IMPLANT
SUT ETHILON 4-0 (SUTURE) ×9
SUT ETHILON 4-0 FS2 18XMFL BLK (SUTURE) ×3
SUT MNCRL AB 3-0 PS2 27 (SUTURE) ×1 IMPLANT
SUT VIC AB 0 CT2 27 (SUTURE) ×3 IMPLANT
SUT VIC AB 2-0 CT2 27 (SUTURE) ×3 IMPLANT
SUTURE ETHLN 4-0 FS2 18XMF BLK (SUTURE) ×1 IMPLANT
SYR 10ML 18GX1 1/2 (NEEDLE) ×2 IMPLANT
SYR 10ML LL (SYRINGE) ×5 IMPLANT
TRAY FOLEY SLVR 16FR LF STAT (SET/KITS/TRAYS/PACK) ×2 IMPLANT
WIRE Z .062 C-WIRE SPADE TIP (WIRE) ×3 IMPLANT

## 2018-05-19 NOTE — OR Nursing (Signed)
Explanted one screw and washer from left ankle. Explanted implants placed  In bio-hazard bin.

## 2018-05-19 NOTE — H&P (Signed)
PREOPERATIVE H&P  Chief Complaint: Left tibia and fibula fracture above previous trimalleolar fracture ORIF  HPI: Jenny [name] is a 72 y.o. female who presents for preoperative history and physical with a diagnosis of a left closed tibia and fibula fracture status post fall on 04/29/2018.  Patient was seen at the emergency department on the date of injury.  The fracture was initially nondisplaced and splinted.  Patient developed fracture blisters and was placed in a boot for close observation of her skin.  Fracture displaced in the boot.  Given the fracture displacement medullary fixation has been recommended.    Past Medical History:  Diagnosis Date  . Anxiety   . Arthritis   . CAD (coronary artery disease)    a. 01/2017 Cath: nonobs dzs.  . Chronic systolic CHF (congestive heart failure) (HCC)    a. TTE 12/2016, EF 45-50%, probable hypokinesis of the mid apical anteroseptal, anterior, & apical myocardium, GR2DD, possibly bicuspid aortic valve that was moderately thickened w/ severely calcified leaflets, severe aortic stenosis w/ mean gradient 47 mmHg, valve area 0.52 cm, mild MR, mildly dilated LA  . Depression   . Diabetes mellitus with complication (HCC)    Tylenol  . Hyperlipidemia   . Hypothyroidism   . IBS (irritable bowel syndrome)   . Iron deficiency anemia    a. s/p pRBC x1 in 12/2016  . Microcytic anemia   . Panic attacks   . Severe aortic stenosis    a. TTE 12/2016: possibly bicuspid aortic valve that was moderately thickened with severely calcified leaflets. There was severe aortic stenosis with a mean gradient of 47 mmHg and valve area of 0.52 cm; b. 01/2017 s/p AVR w/ 19 mm bioprosthetic Magna Ease pericardial tissue valve, ser# [id number].  . Stroke (HCC) 2001  . TIA (transient ischemic attack) 2006   Past Surgical History:  Procedure Laterality Date  . ABDOMINAL HYSTERECTOMY    . AORTIC VALVE REPLACEMENT N/A 02/17/2017   Procedure: AORTIC VALVE REPLACEMENT (AVR);   Surgeon: Donata Clay, Theron Arista, MD;  Location: [facility] OR;  Service: Open Heart Surgery;  Laterality: N/A;  . CATARACT EXTRACTION W/PHACO Right 04/09/2016   Procedure: CATARACT EXTRACTION PHACO AND INTRAOCULAR LENS PLACEMENT (IOC);  Surgeon: Galen Manila, MD;  Location: [facility] ORS;  Service: Ophthalmology;  Laterality: Right;  [place] 57.2 AP% 18.6 CDE 10.64 Fluid Pack lot # [id number]  . CHOLECYSTECTOMY    . COLONOSCOPY    . CORONARY ANGIOGRAPHY N/A 02/03/2017   Procedure: CORONARY ANGIOGRAPHY;  Surgeon: Iran Ouch, MD;  Location: [facility] INVASIVE CV LAB;  Service: Cardiovascular;  Laterality: N/A;  . FRACTURE SURGERY     LEFT ANKLE  . RIGHT AND LEFT HEART CATH Bilateral 02/03/2017   Procedure: Right and Left Heart Cath;  Surgeon: Iran Ouch, MD;  Location: [facility] INVASIVE CV LAB;  Service: Cardiovascular;  Laterality: Bilateral;  . TEE WITHOUT CARDIOVERSION N/A 02/17/2017   Procedure: TRANSESOPHAGEAL ECHOCARDIOGRAM (TEE);  Surgeon: Donata Clay, Theron Arista, MD;  Location: [facility] OR;  Service: Open Heart Surgery;  Laterality: N/A;   Social History   Socioeconomic History  . Marital status: Married    Spouse name: Not on file  . Number of children: Not on file  . Years of education: Not on file  . Highest education level: Not on file  Occupational History  . Not on file  Social Needs  . Financial resource strain: Not on file  . Food insecurity:    Worry: Not on file    Inability:  Not on file  . Transportation needs:    Medical: Not on file    Non-medical: Not on file  Tobacco Use  . Smoking status: Former Smoker    Years: 30.00    Last attempt to quit: 02/04/2000    Years since quitting: 18.2  . Smokeless tobacco: Never Used  Substance and Sexual Activity  . Alcohol use: No  . Drug use: No  . Sexual activity: Not on file  Lifestyle  . Physical activity:    Days per week: Not on file    Minutes per session: Not on file  . Stress: Not on file  Relationships  . Social connections:    Talks on  phone: Not on file    Gets together: Not on file    Attends religious service: Not on file    Active member of club or organization: Not on file    Attends meetings of clubs or organizations: Not on file    Relationship status: Not on file  Other Topics Concern  . Not on file  Social History Narrative  . Not on file   Family History  Problem Relation Age of Onset  . Hypertension Mother    Allergies  Allergen Reactions  . Iodine Swelling    ANGIOEDEMA FACIAL SWELLING "BETADINE OKAY"  . Shellfish Allergy Anaphylaxis   Prior to Admission medications   Medication Sig Start Date End Date Taking? Authorizing Provider  ALPRAZolam Prudy Feeler) 0.5 MG tablet Take 0.5 mg by mouth 2 (two) times daily.    Yes [provider]  aspirin EC 81 MG tablet Take 81 mg by mouth daily.   Yes [provider]  hydrOXYzine (ATARAX/VISTARIL) 25 MG tablet Take 25 mg by mouth 2 (two) times daily.    Yes [provider]  insulin glargine (LANTUS) 100 UNIT/ML injection Inject 25-35 Units into the skin See admin instructions. 35 units every morning and 25 units at bedtime   Yes [provider]  levothyroxine (SYNTHROID, LEVOTHROID) 88 MCG tablet Take 88 mcg by mouth daily.   Yes [provider]  metFORMIN (GLUCOPHAGE) 500 MG tablet Take 500 mg by mouth 2 (two) times daily.   Yes [provider]  oxyCODONE-acetaminophen (PERCOCET) 5-325 MG tablet Take 1 tablet by mouth every 4 (four) hours as needed for severe pain. 04/29/18 04/29/19 Yes Phineas Semen, MD  rosuvastatin (CRESTOR) 10 MG tablet Take 1 tablet (10 mg total) by mouth daily. 03/12/17 05/19/18 Yes Creig Hines, NP  sertraline (ZOLOFT) 100 MG tablet Take 100 mg by mouth 2 (two) times daily.   Yes [provider]  sitaGLIPtin (JANUVIA) 100 MG tablet Take 100 mg by mouth daily.   Yes [provider]     Positive ROS: All other systems have been reviewed and were otherwise  negative with the exception of those mentioned in the HPI and as above.  Physical Exam: General: Alert, no acute distress Cardiovascular: Regular rate and rhythm, no murmurs rubs or gallops.  No pedal edema Respiratory: Clear to auscultation bilaterally, no wheezes rales or rhonchi. No cyanosis, no use of accessory musculature GI: No organomegaly, abdomen is soft and non-tender nondistended with positive bowel sounds. Skin: Skin intact, no lesions within the operative field. Neurologic: Sensation intact distally Psychiatric: Patient is competent for consent with normal mood and affect Lymphatic: No cervical lymphadenopathy  MUSCULOSKELETAL: Left lower extremity: Patient has 2 large healing fracture blisters over the anterior medial leg.  Compartments are soft and compressible.  Her  skin is intact otherwise.  She has an external rotation deformity to the left lower extremity.  She is neurovascular intact.  Intact motor function distally.  Assessment: Displaced left tibial shaft fracture with associated high fibula fracture.  Plan: Plan for Procedure(s): INTRAMEDULLARY FIXATION OF LEFT DISTAL TIBIAL SHAFT FRACTURE  Reviewed the details of the operation as well as the postoperative course.  Patient will require removal of her medial malleolar screws.  An intramedullary rod will be used for fixation of her distal tibial shaft fracture.  We discussed the postoperative course as well.  Will require protected weightbearing for several weeks postop.  I discussed the risks and benefits of surgery. The patient understands the risks include but are not limited to infection, bleeding requiring blood transfusion, nerve or blood vessel injury, joint stiffness or loss of motion, persistent pain, weakness or instability, malunion, nonunion and hardware failure and the need for further surgery. Medical risks include but are not limited to DVT and pulmonary embolism, myocardial infarction, stroke, pneumonia,  respiratory failure and death. Patient understood these risks and wished to proceed.    Juanell Fairly, MD   05/19/2018 10:00 AM

## 2018-05-19 NOTE — NC FL2 (Signed)
  [place] MEDICAID FL2 LEVEL OF CARE SCREENING TOOL     IDENTIFICATION  Patient Name: SUJATA MAINES Birthdate: October 09, 1945 Sex: female Admission Date (Current Location): 05/19/2018  [place] and [place] Number:  Chiropodist and Address:  [facility], [address], [place], [place] [address]      Provider Number: [id number]  Attending Physician Name and Address:  Juanell Fairly, MD  Relative Name and Phone Number:       Current Level of Care: Hospital Recommended Level of Care: Skilled Nursing Facility Prior Approval Number:    Date Approved/Denied:   PASRR Number: [id number])  Discharge Plan: SNF    Current Diagnoses: Patient Active Problem List   Diagnosis Date Noted  . S/P AVR (aortic valve replacement) 02/17/2017  . Diabetes (HCC) 01/29/2017  . Depression 01/29/2017  . Severe aortic stenosis 01/22/2017  . Chronic systolic CHF (congestive heart failure) (HCC) 01/22/2017  . Anemia 01/16/2017  . Acute CHF (congestive heart failure) (HCC) 01/16/2017  . Elevated troponin 01/16/2017  . Hypokalemia 01/16/2017  . Chest pain 01/15/2017    Orientation RESPIRATION BLADDER Height & Weight     Self, Time, Situation, Place  Normal Continent Weight:   Height:     BEHAVIORAL SYMPTOMS/MOOD NEUROLOGICAL BOWEL NUTRITION STATUS      Continent Diet(Diet: Regular )  AMBULATORY STATUS COMMUNICATION OF NEEDS Skin   Extensive Assist Verbally Surgical wounds(Incision: Left Knee, Incision: Left Ankle. )                       Personal Care Assistance Level of Assistance  Bathing, Feeding, Dressing Bathing Assistance: Limited assistance Feeding assistance: Independent Dressing Assistance: Limited assistance     Functional Limitations Info  Sight, Hearing, Speech Sight Info: Adequate Hearing Info: Adequate Speech Info: Adequate    SPECIAL CARE FACTORS FREQUENCY  PT (By licensed PT), OT (By licensed OT)     PT Frequency:  (5) OT Frequency: (5)            Contractures      Additional Factors Info  Code Status, Allergies Code Status Info: (Full Code. ) Allergies Info: (Iodine, Shellfish Allergy)           Current Medications (05/19/2018):  This is the current hospital active medication list Current Facility-Administered Medications  Medication Dose Route Frequency Provider Last Rate Last Dose  . 0.9 %  sodium chloride infusion   Intravenous Continuous [name], MD 50 mL/hr at 05/19/18 0900    . Chlorhexidine Gluconate Cloth 2 % PADS 6 each  6 each Topical Once Juanell Fairly, MD       And  . Chlorhexidine Gluconate Cloth 2 % PADS 6 each  6 each Topical Once Juanell Fairly, MD      . fentaNYL (SUBLIMAZE) 100 MCG/2ML injection           . fentaNYL (SUBLIMAZE) injection 25 mcg  25 mcg Intravenous Q5 min PRN [name], MD      . ondansetron [facility]) injection 4 mg  4 mg Intravenous Once PRN [name], MD         Discharge Medications: Please see discharge summary for a list of discharge medications.  Relevant Imaging Results:  Relevant Lab Results:   Additional Information (SSN: [id number])  Nyella Eckels, Darleen Crocker, LCSW

## 2018-05-19 NOTE — Anesthesia Post-op Follow-up Note (Signed)
Anesthesia QCDR form completed.        

## 2018-05-19 NOTE — Anesthesia Preprocedure Evaluation (Signed)
Anesthesia Evaluation  Patient identified by MRN, date of birth, ID band Patient awake    Reviewed: Allergy & Precautions, H&P , NPO status , Patient's Chart, lab work & pertinent test results, reviewed documented beta blocker date and time   Airway Mallampati: II  TM Distance: >3 FB Neck ROM: full    Dental  (+) Teeth Intact   Pulmonary neg pulmonary ROS, former smoker,    Pulmonary exam normal        Cardiovascular Exercise Tolerance: Poor + CAD and +CHF  negative cardio ROS Normal cardiovascular exam Rhythm:regular Rate:Normal     Neuro/Psych TIACVA, No Residual Symptoms negative neurological ROS  negative psych ROS   GI/Hepatic negative GI ROS, Neg liver ROS,   Endo/Other  negative endocrine ROSdiabetes, Well Controlled, Type 1, Insulin Dependent, Oral Hypoglycemic AgentsHypothyroidism   Renal/GU negative Renal ROS  negative genitourinary   Musculoskeletal   Abdominal   Peds  Hematology negative hematology ROS (+) Blood dyscrasia, anemia ,   Anesthesia Other Findings Past Medical History: No date: Anxiety No date: Arthritis No date: CAD (coronary artery disease)     Comment:  a. 01/2017 Cath: nonobs dzs. No date: Chronic systolic CHF (congestive heart failure) (HCC)     Comment:  a. TTE 12/2016, EF 45-50%, probable hypokinesis of the               mid apical anteroseptal, anterior, & apical myocardium,               GR2DD, possibly bicuspid aortic valve that was moderately              thickened w/ severely calcified leaflets, severe aortic               stenosis w/ mean gradient 47 mmHg, valve area 0.52 cm,               mild MR, mildly dilated LA No date: Depression No date: Diabetes mellitus with complication (HCC)     Comment:  Tylenol No date: Hyperlipidemia No date: Hypothyroidism No date: IBS (irritable bowel syndrome) No date: Iron deficiency anemia     Comment:  a. s/p pRBC x1 in 12/2016 No  date: Microcytic anemia No date: Panic attacks No date: Severe aortic stenosis     Comment:  a. TTE 12/2016: possibly bicuspid aortic valve that was               moderately thickened with severely calcified leaflets.               There was severe aortic stenosis with a mean gradient of               47 mmHg and valve area of 0.52 cm; b. 01/2017 s/p AVR w/               19 mm bioprosthetic Magna Ease pericardial tissue valve,               ser# [id number]. 2001: Stroke (HCC) 2006: TIA (transient ischemic attack) Past Surgical History: No date: ABDOMINAL HYSTERECTOMY 02/17/2017: AORTIC VALVE REPLACEMENT; N/A     Comment:  Procedure: AORTIC VALVE REPLACEMENT (AVR);  Surgeon: Donata Clay, Theron Arista, MD;  Location: [facility] OR;  Service: Open Heart               Surgery;  Laterality: N/A; 04/09/2016: CATARACT EXTRACTION W/PHACO; Right     Comment:  Procedure: CATARACT EXTRACTION PHACO AND INTRAOCULAR               LENS PLACEMENT (IOC);  Surgeon: Galen Manila, MD;                Location: [facility] ORS;  Service: Ophthalmology;  Laterality:              Right;  [place] 57.2 AP% 18.6 CDE 10.64 Fluid Pack lot #               [id number] No date: CHOLECYSTECTOMY No date: COLONOSCOPY 02/03/2017: CORONARY ANGIOGRAPHY; N/A     Comment:  Procedure: CORONARY ANGIOGRAPHY;  Surgeon: Iran Ouch, MD;  Location: [facility] INVASIVE CV LAB;                Service: Cardiovascular;  Laterality: N/A; No date: FRACTURE SURGERY     Comment:  LEFT ANKLE 02/03/2017: RIGHT AND LEFT HEART CATH; Bilateral     Comment:  Procedure: Right and Left Heart Cath;  Surgeon: Iran Ouch, MD;  Location: [facility] INVASIVE CV LAB;                Service: Cardiovascular;  Laterality: Bilateral; 02/17/2017: TEE WITHOUT CARDIOVERSION; N/A     Comment:  Procedure: TRANSESOPHAGEAL ECHOCARDIOGRAM (TEE);                Surgeon: Donata Clay, Theron Arista, MD;  Location: [facility] OR;                Service: Open Heart  Surgery;  Laterality: N/A;   Reproductive/Obstetrics negative OB ROS                             Anesthesia Physical Anesthesia Plan  ASA: III  Anesthesia Plan: General ETT   Post-op Pain Management:    Induction:   PONV Risk Score and Plan:   Airway Management Planned:   Additional Equipment:   Intra-op Plan:   Post-operative Plan:   Informed Consent: I have reviewed the patients History and Physical, chart, labs and discussed the procedure including the risks, benefits and alternatives for the proposed anesthesia with the patient or authorized representative who has indicated his/her understanding and acceptance.   Dental Advisory Given  Plan Discussed with: CRNA  Anesthesia Plan Comments:         Anesthesia Quick Evaluation

## 2018-05-19 NOTE — Clinical Social Work Placement (Signed)
   CLINICAL SOCIAL WORK PLACEMENT  NOTE  Date:  05/19/2018  Patient Details  Name: Jenny [name] MRN: [id number] Date of Birth: Jul 13, 1945  Clinical Social Work is seeking post-discharge placement for this patient at the Skilled  Nursing Facility level of care (*CSW will initial, date and re-position this form in  chart as items are completed):  Yes   Patient/family provided with [place] Clinical Social Work Department's list of facilities offering this level of care within the geographic area requested by the patient (or if unable, by the patient's family).  Yes   Patient/family informed of their freedom to choose among providers that offer the needed level of care, that participate in Medicare, Medicaid or managed care program needed by the patient, have an available bed and are willing to accept the patient.  Yes   Patient/family informed of [facility]'s ownership interest in [facility] and [facility], as well as of the fact that they are under no obligation to receive care at these facilities.  PASRR submitted to EDS on       PASRR number received on       Existing PASRR number confirmed on 05/19/18     FL2 transmitted to all facilities in geographic area requested by pt/family on 05/19/18     FL2 transmitted to all facilities within larger geographic area on       Patient informed that his/her managed care company has contracts with or will negotiate with certain facilities, including the following:        Yes   Patient/family informed of bed offers received.  Patient chooses bed at ([facility] )     Physician recommends and patient chooses bed at      Patient to be transferred to   on  .  Patient to be transferred to facility by       Patient family notified on   of transfer.  Name of family member notified:        PHYSICIAN       Additional Comment:    _______________________________________________ Geralene Afshar, Darleen Crocker, LCSW 05/19/2018, 5:08 PM

## 2018-05-19 NOTE — Anesthesia Procedure Notes (Signed)
Procedure Name: Intubation Date/Time: 05/19/2018 10:24 AM Performed by: Lavone Orn, CRNA Pre-anesthesia Checklist: Patient identified, Emergency Drugs available, Suction available, Patient being monitored and Timeout performed Patient Re-evaluated:Patient Re-evaluated prior to induction Oxygen Delivery Method: Circle system utilized Preoxygenation: Pre-oxygenation with 100% oxygen Induction Type: IV induction Ventilation: Mask ventilation without difficulty Laryngoscope Size: Mac and 3 Grade View: Grade I Tube type: Oral Tube size: 7.0 mm Number of attempts: 1 Airway Equipment and Method: Stylet Placement Confirmation: ETT inserted through vocal cords under direct vision,  positive ETCO2 and breath sounds checked- equal and bilateral Secured at: 20 cm Tube secured with: Tape Dental Injury: Teeth and Oropharynx as per pre-operative assessment

## 2018-05-19 NOTE — Transfer of Care (Signed)
Immediate Anesthesia Transfer of Care Note  Patient: Jenny [name]  Procedure(s) Performed: INTRAMEDULLARY (IM) NAIL TIBIAL (Left Ankle)  Patient Location: PACU  Anesthesia Type:General  Level of Consciousness: awake and patient cooperative  Airway & Oxygen Therapy: Patient Spontanous Breathing and Patient connected to face mask oxygen  Post-op Assessment: Report given to RN and Post -op Vital signs reviewed and stable  Post vital signs: stable  Last Vitals:  Vitals Value Taken Time  BP 116/51 05/19/2018  1:51 PM  Temp 36.1 C 05/19/2018  1:48 PM  Pulse 100 05/19/2018  1:56 PM  Resp 24 05/19/2018  1:56 PM  SpO2 100 % 05/19/2018  1:56 PM  Vitals shown include unvalidated device data.  Last Pain:  Vitals:   05/19/18 1348  TempSrc:   PainSc: 0-No pain         Complications: No apparent anesthesia complications

## 2018-05-19 NOTE — Op Note (Signed)
DATE OF SURGERY:  05/19/2018  TIME: 2:10 PM  PATIENT NAME:  Jenny [name]  AGE: 72 y.o.  PRE-OPERATIVE DIAGNOSIS:  Left displaced distal tibia and fibula shaft fracture  POST-OPERATIVE DIAGNOSIS:  SAME  PROCEDURE:  INTRAMEDULLARY FIXATION OF LEFT DISPLACED DISTAL TIBIA FRACTURE  SURGEON:  Juanell Fairly  OPERATIVE IMPLANTS: Biomet Versanail 10 x 28.5 cm, 30 mm proximal interlocking screw with a 2 distal orthogonal interlocking screws  PREOPERATIVE INDICATIONS:  Jenny [name] is a 72 y.o. year old who fell and suffered a tibia fracture. Patient had displacement of her fracture over time and required surgical management of the tibia fracture.    The risks, benefits and alternatives were discussed with the patient and their family.  The risks include but are not limited to infection, bleeding, nerve or blood vessel injury, malunion, nonunion, hardware prominence, hardware failure, change in leg lengths or lower extremity rotation need for further surgery. Medical risks include but are not limited to DVT and pulmonary embolism, myocardial infarction, stroke, pneumonia, respiratory failure and death. The patient and their family understood these risks and wished to proceed with surgery.  OPERATIVE PROCEDURE:  The patient was brought to the operating room and placed in the supine position on the radiolucent OR table.. General anesthesia was administered.  The patient was prepped and draped in a sterile fashion.  A closed reduction was performed using a tibial distractor under C-arm guidance.  The fracture reduction was confirmed on both AP and lateral views. A time out was performed to verify the patient's name, date of birth, medical record number, correct site of surgery correct procedure to be performed. It was also used to verify the patient received antibiotics and that appropriate instruments, implants and radiographic studies were available in the room. Once all in attendance were in  agreement, the case began.   Patient had ORIF of the left ankle.  In order to place the intramedullary rod distal enough the medial malleolus screw needed to be removed.  C-arm images were taken of the ankle.  The location of the screw was identified.  Stab incision was made over the screw head and the screw was removed by hand.  Both the screw and washer were removed.  X-ray films of the ankle were repeated demonstrating removal of the medial ankle hardware.  The attention was then turned to placement of the intramedullary rod following irrigation and closure of the medial stab incision with 4-0 nylon suture.  A tibial traction triangle was used for this case.  Traction was applied and rotation was applied to allow for near anatomic reduction of the fracture.  2 stab incisions both medial and lateral to the fracture were made in the skin to allow placement of fracture reduction clamps to hold the reduction.  A mid line incision was made extending from the tibial tubercle to the inferior pole of the patella. A threaded guidepin was then inserted into the proximal tibia with a drill. A starting awl was then placed over this and drill pin and an entry hole was made in the proximal tibia. The threaded guidepin was removed and replaced with a ball tip guidewire which was advanced down the tibial shaft and across the fracture site into the distal tibia. Position of this ball tip guidewire was confirmed on AP and lateral C-arm images. Sequential reamers were then used over the ball-tipped guidewire until sufficient cortical chatter was achieved.  An 11 mm reamer was the largest reamer passed completely through the  length of the tibia.  A nail diameter of 10 mm was selected. The depth of the guidewire was measured to determine the appropriate length of the nail.  Length was 28.5 cm.  It was the still available.  The nail was then assembled onto the inserting jig and advanced into position within the tibial shaft. Its  final position was confirmed on AP and lateral C-arm images both proximally and distally.   Distal interlocking screws were then placed using a perfect circle technique.  Once the position of the distal interlocking screw hole was identified with C-arm, a small stab incision was made to allow the drill to approximate the medial cortex of the tibia. The drill for the interlocking screw was then advanced bicortically. The diameter of the tibia was measured through this drill hole using a depth gauge. An interlocking screw with the length measured was then inserted by hand.2 orthogonal distal interlocking screws were placed one ATP and one medial to lateral.    The attention was then turned to cement of a proximal interlocking screw.  The drill guide for the lag screw was placed through the guide arm for the Versanail. A guidepin was then placed through this drill guide and advanced through the both cortices of the tibia.  A depth gauge was then used to determine the length of the screw.  The screw was measured to be 30 mm.  This was advanced into position by hand through the tibial guide arm.  Once all interlocking screws were in position, final C-arm images of the intramedullary construct were taken in both the AP and lateral planes including the proximal and distal ends of the tibia as well as the fracture site. The insertion arm for the tibial nail was then removed.  The wounds were irrigated copiously and closed with 0 Vicryl for closure of the deep fascia and 2-0 Vicryl for his subcutaneous closure. The skin was approximated with staples. A dry sterile dressing was applied. An AO splint was also applied to the operative leg.  I was scrubbed and present the entire case and all sharp and instrument counts were correct at the conclusion of the case. Patient was transferred to hospital bed and brought to PACU in stable condition. I spoke with the patient's husband in the postoperatively to let him know the  case had gone without complication and the patient was stable in the recovery room.   Kathreen Devoid, MD

## 2018-05-19 NOTE — Consult Note (Signed)
Increased enoxaparin from 30 mg SQ daily to 40 mg daily. Starts tomorrow at 8am as patient just got out of surgery for tibia/fibula repair.   Mauri Reading, PharmD Pharmacy Resident  05/19/2018 4:39 PM

## 2018-05-19 NOTE — Clinical Social Work Note (Signed)
Clinical Social Work Assessment  Patient Details  Name: USHA SLAGER MRN: [id number] Date of Birth: [birth year]  Date of referral:  05/19/18               Reason for consult:  Facility Placement                Permission sought to share information with:  Chartered certified accountant granted to share information::  Yes, Verbal Permission Granted  Name::      [place]::   [place]   Relationship::     Contact Information:     Housing/Transportation Living arrangements for the past 2 months:  [place] of Information:  Patient, Spouse Patient Interpreter Needed:  None Criminal Activity/Legal Involvement Pertinent to Current Situation/Hospitalization:  No - Comment as needed Significant Relationships:  Adult Children, Spouse Lives with:  Spouse Do you feel safe going back to the place where you live?  Yes Need for family participation in patient care:  Yes (Comment)  Care giving concerns:  Patient lives in [place] with her husband Lanny Hurst.    Social Worker assessment / plan:  Holiday representative (CSW) received verbal consult from Dr. Mack Guise that patient and her husband want SNF. Patient is post op day 0 and PT is pending. CSW met with patient and her husband Lanny Hurst was at bedside. Patient was alert and oriented X4 and was laying in the bed. CSW introduced self and explained role of CSW department. Per patient she lives in [place] with her husband and she has been struggling at home. Per patient and husband they want patient to go to [facility]. CSW explained that PT will evaluate patient and make a recommendation of home health or SNF. CSW also explained that Medicare requires a 3 night qualifying inpatient stay in a hospital in order to pay for SNF. Patient was admitted to inpatient 05/19/18. CSW explained that if patient changes to observation then Medicare will not pay for SNF. CSW discussed private pay SNF options in  case patient changes to observation. Per husband they can't afford to pay privately for SNF and will take patient home with home health if she changes to observation. FL2 complete and faxed out.   CSW presented bed offers to patient and husband. They chose [facility]. [facility] liaison is aware of accepted bed offer. CSW will continue to follow and assist as needed.   Employment status:  Retired Forensic scientist:  Medicare PT Recommendations:  Not assessed at this time Information / Referral to community resources:  [place]  Patient/Family's Response to care:  Patient and her husband chose [facility].   Patient/Family's Understanding of and Emotional Response to Diagnosis, Current Treatment, and Prognosis:  Patient was very pleasant and thanked CSW for assistance.   Emotional Assessment Appearance:  Appears stated age Attitude/Demeanor/Rapport:    Affect (typically observed):  Accepting, Adaptable, Pleasant Orientation:  Oriented to Self, Oriented to Place, Oriented to  Time, Oriented to Situation Alcohol / Substance use:  Not Applicable Psych involvement (Current and /or in the community):  No (Comment)  Discharge Needs  Concerns to be addressed:  Discharge Planning Concerns Readmission within the last 30 days:  No Current discharge risk:  Dependent with Mobility Barriers to Discharge:  Continued Medical Work up   [employer], Veronia Beets, LCSW 05/19/2018, 5:08 PM

## 2018-05-20 ENCOUNTER — Encounter: Payer: Self-pay | Admitting: Orthopedic Surgery

## 2018-05-20 LAB — BASIC METABOLIC PANEL
ANION GAP: 6 (ref 5–15)
BUN: 13 mg/dL (ref 8–23)
CHLORIDE: 107 mmol/L (ref 98–111)
CO2: 25 mmol/L (ref 22–32)
Calcium: 7.8 mg/dL — ABNORMAL LOW (ref 8.9–10.3)
Creatinine, Ser: 0.56 mg/dL (ref 0.44–1.00)
GFR calc Af Amer: >60 mL/min (ref >60)
GFR calc non Af Amer: >60 mL/min (ref >60)
Glucose, Bld: 185 mg/dL — ABNORMAL HIGH (ref 70–99)
POTASSIUM: 4.7 mmol/L (ref 3.5–5.1)
Sodium: 138 mmol/L (ref 135–145)

## 2018-05-20 LAB — CBC
HEMATOCRIT: 31.5 % — AB (ref 36.0–46.0)
HEMOGLOBIN: 10.2 g/dL — AB (ref 12.0–15.0)
MCH: 27.8 pg (ref 26.0–34.0)
MCHC: 32.4 g/dL (ref 30.0–36.0)
MCV: 85.8 fL (ref 80.0–100.0)
NRBC: 0 % (ref 0.0–0.2)
Platelets: 99 10*3/uL — ABNORMAL LOW (ref 150–400)
RBC: 3.67 MIL/uL — AB (ref 3.87–5.11)
RDW: 14 % (ref 11.5–15.5)
WBC: 5.4 10*3/uL (ref 4.0–10.5)

## 2018-05-20 LAB — GLUCOSE, CAPILLARY
GLUCOSE-CAPILLARY: 164 mg/dL — AB (ref 70–99)
GLUCOSE-CAPILLARY: 244 mg/dL — AB (ref 70–99)
Glucose-Capillary: 122 mg/dL — ABNORMAL HIGH (ref 70–99)
Glucose-Capillary: 182 mg/dL — ABNORMAL HIGH (ref 70–99)

## 2018-05-20 NOTE — Progress Notes (Signed)
Physical Therapy Treatment Patient Details Name: Jenny [name] MRN: [id number] DOB: March 26, 1946 Today's Date: 05/20/2018    History of Present Illness Pt is a 72 y.o. female who presents with Left displaced distal tibia and fibula shaft fracture and is s/p IM fixation of the left distal tibia.  PMH includes: CAD, CHF, TIA, type 1 DM, anxiety, anemia, aortic valve replacement, depression, and HLD.       PT Comments    Pt presents with deficits in strength, transfers,  gait, balance, and activity tolerance but is progressing towards goals.  Pt was Mod Ind with bed mobility tasks with the use of the bed rail.  Pt continues to require the EOB to be elevated in order to stand along with cues for sequencing to ensure LLE WB compliance.  Pt was able to amb 2 x 3-4' with a combination of hop-to steps and some R foot shuffling with min instability requiring min A to correct.  Pt will benefit from PT services in a SNF setting upon discharge to safely address above deficits for decreased caregiver assistance and eventual return to PLOF.     Follow Up Recommendations  SNF     Equipment Recommendations  None recommended by PT    Recommendations for Other Services       Precautions / Restrictions Precautions Precautions: Fall Restrictions Weight Bearing Restrictions: Yes LLE Weight Bearing: Touchdown weight bearing    Mobility  Bed Mobility Overal bed mobility: Modified Independent             General bed mobility comments: Good speed and effort with sup to/from sit with the use of the bed rail  Transfers Overall transfer level: Needs assistance Equipment used: Rolling walker (2 wheeled) Transfers: Sit to/from Stand Sit to Stand: Min guard;From elevated surface         General transfer comment: Min verbal cues for sequencing to ensure LLE WB compliance  Ambulation/Gait Ambulation/Gait assistance: Min assist Gait Distance (Feet): 4 Feet Assistive device: Rolling walker (2  wheeled)   Gait velocity: Decreased   General Gait Details: Hop-to pattern with occasional R foot shuffling at the EOB with min A for stability and min verbal cues for sequencing to maintain LLE WB compliance.    Stairs             Wheelchair Mobility    Modified Rankin (Stroke Patients Only)       Balance Overall balance assessment: Needs assistance   Sitting balance-Leahy Scale: Normal     Standing balance support: Bilateral upper extremity supported;During functional activity Standing balance-Leahy Scale: Poor Standing balance comment: required BUE support with the RW and occasional min A for stability during hop-to ambulation                            Cognition Arousal/Alertness: Awake/alert Behavior During Therapy: WFL for tasks assessed/performed Overall Cognitive Status: Within Functional Limits for tasks assessed                                        Exercises Total Joint Exercises Ankle Circles/Pumps: Right;5 reps;10 reps;AROM Quad Sets: Strengthening;Both;5 reps;10 reps Gluteal Sets: Strengthening;5 reps;Both;10 reps Heel Slides: AROM;Right;5 reps Hip ABduction/ADduction: AROM;Both;10 reps Straight Leg Raises: AROM;Both;10 reps Long Arc Quad: AROM;Both;10 reps Knee Flexion: AROM;Both;10 reps Other Exercises Other Exercises: HEP review for RLE APs, BLE GS and  QS x 10 each every 1-2 hours during the day Other Exercises: pt educated in home/routines modifications, falls prevention strategies, precautions, and how to implement during ADL tasks Other Exercises: pt educated in use of DME/AE to improve safety/indep with ADL    General Comments        Pertinent Vitals/Pain Pain Assessment: 0-10 Pain Score: 4  Pain Location: LLE/ankle Pain Descriptors / Indicators: Sore Pain Intervention(s): Premedicated before session;Monitored during session    Home Living Family/patient expects to be discharged to:: Private  residence Living Arrangements: Spouse/significant other Available Help at Discharge: Family;Available 24 hours/day Type of Home: House Home Access: Stairs to enter Entrance Stairs-Rails: Right;Left;Can reach both Home Layout: Two level;Able to live on main level with bedroom/bathroom Home Equipment: Crutches;Walker - 2 wheels;Other (comment)(Can rent a w/c, has done so in the past)      Prior Function Level of Independence: Independent      Comments: Pt Ind with amb community distances without an AD, Ind with all ADLs, no other falls in the last 6 months other than current fall. After fall prior to admission, pt required assist from family for ADL   PT Goals (current goals can now be found in the care plan section) Acute Rehab PT Goals Patient Stated Goal: to go to rehab, get stronger, and return home PT Goal Formulation: With patient Time For Goal Achievement: 06/02/18 Potential to Achieve Goals: Good Progress towards PT goals: Progressing toward goals    Frequency    BID      PT Plan Current plan remains appropriate    Co-evaluation              AM-PAC PT "6 Clicks" Daily Activity  Outcome Measure  Difficulty turning over in bed (including adjusting bedclothes, sheets and blankets)?: None Difficulty moving from lying on back to sitting on the side of the bed? : None Difficulty sitting down on and standing up from a chair with arms (e.g., wheelchair, bedside commode, etc,.)?: Unable Help needed moving to and from a bed to chair (including a wheelchair)?: A Little Help needed walking in hospital room?: A Lot Help needed climbing 3-5 steps with a railing? : Total 6 Click Score: 15    End of Session Equipment Utilized During Treatment: Gait belt Activity Tolerance: Patient tolerated treatment well Patient left: in chair;with chair alarm set;with call bell/phone within reach;with SCD's reapplied Nurse Communication: Mobility status PT Visit Diagnosis: Unsteadiness  on feet (R26.81);Muscle weakness (generalized) (M62.81);Other abnormalities of gait and mobility (R26.89)     Time: 1345-1414 PT Time Calculation (min) (ACUTE ONLY): 29 min  Charges:  $Gait Training: 8-22 mins $Therapeutic Exercise: 8-22 mins                     D. Scott Mykenna Viele PT, DPT 05/20/18, 3:13 PM

## 2018-05-20 NOTE — Evaluation (Signed)
Physical Therapy Evaluation Patient Details Name: Jenny [name] MRN: [id number] DOB: [birth year] Today's Date: 05/20/2018   History of Present Illness  Pt is a 72 y.o. female who presents with Left displaced distal tibia and fibula shaft fracture and is s/p IM fixation of the left distal tibia.  PMH includes: CAD, CHF, TIA, type 1 DM, anxiety, anemia, aortic valve replacement, depression, and HLD.       Clinical Impression  Pt presents with deficits in strength, transfers, gait, balance, and activity tolerance.  Pt was Ind with bed mobility tasks showing good speed and effort during sup to/from sit.  Pt required close CGA and for the bed to be elevated during sit to/from stand transfers along with mod verbal cues for sequencing with pt's L foot on top of this PT's foot to ensure WB compliance.  Pt ambulated around 3' at the EOB with hop-to pattern with occasional R foot shuffling at the EOB with min A for stability and mod verbal cues for sequencing to maintain LLE WB compliance.  Pt will benefit from PT services in a SNF setting upon discharge to safely address above deficits for decreased caregiver assistance and eventual return to PLOF.      Follow Up Recommendations SNF    Equipment Recommendations  None recommended by PT    Recommendations for Other Services       Precautions / Restrictions Precautions Precautions: Fall Restrictions Weight Bearing Restrictions: Yes LLE Weight Bearing: Touchdown weight bearing      Mobility  Bed Mobility Overal bed mobility: Independent             General bed mobility comments: Good speed and effort with sup to/from sit and rolling  Transfers Overall transfer level: Needs assistance Equipment used: Rolling walker (2 wheeled) Transfers: Sit to/from Stand Sit to Stand: Min guard;From elevated surface         General transfer comment: Mod verbal cues for sequencing with pt's L foot on top of this PT's foot to ensure WB  compliance.  Pt required extra effort and for the bed to be elevated in order to stand.   Ambulation/Gait Ambulation/Gait assistance: Min assist Gait Distance (Feet): 3 Feet Assistive device: Rolling walker (2 wheeled)       General Gait Details: Hop-to pattern with occasional R foot shuffling at the EOB with min A for stability and mod verbal cues for sequencing to maintain LLE WB compliance.   Stairs            Wheelchair Mobility    Modified Rankin (Stroke Patients Only)       Balance Overall balance assessment: Needs assistance   Sitting balance-Leahy Scale: Normal     Standing balance support: Bilateral upper extremity supported;During functional activity   Standing balance comment: Fair static standing balance but poor dynamic standing balance with pt requiring min A for stability while taking hop-to steps                             Pertinent Vitals/Pain Pain Assessment: 0-10 Pain Score: 3  Pain Location: L ankle Pain Descriptors / Indicators: Sore Pain Intervention(s): Premedicated before session;Monitored during session    Home Living Family/patient expects to be discharged to:: Private residence Living Arrangements: Spouse/significant other Available Help at Discharge: Family;Available 24 hours/day Type of Home: House Home Access: Stairs to enter Entrance Stairs-Rails: Right;Left;Can reach both Entrance Stairs-Number of Steps: 4 Home Layout: Two level;Able to live on  main level with bedroom/bathroom Home Equipment: Crutches;Walker - 2 wheels;Other (comment)(Can rent a w/c, has done so in the past)      Prior Function Level of Independence: Independent         Comments: Pt Ind with amb community distances without an AD, Ind with all ADLs, no other falls in the last 6 months other than current fall     Hand Dominance        Extremity/Trunk Assessment   Upper Extremity Assessment Upper Extremity Assessment: Defer to OT  evaluation    Lower Extremity Assessment Lower Extremity Assessment: Generalized weakness;LLE deficits/detail LLE Deficits / Details: L hip flex 4/5 LLE: Unable to fully assess due to immobilization       Communication   Communication: No difficulties  Cognition Arousal/Alertness: Awake/alert Behavior During Therapy: WFL for tasks assessed/performed Overall Cognitive Status: Within Functional Limits for tasks assessed                                        General Comments      Exercises Total Joint Exercises Ankle Circles/Pumps: Strengthening;Right;5 reps;10 reps Quad Sets: Strengthening;Both;5 reps;10 reps Gluteal Sets: Strengthening;5 reps;Both;10 reps Heel Slides: AROM;Right;5 reps Hip ABduction/ADduction: AROM;Both;5 reps Straight Leg Raises: AROM;Both;5 reps Long Arc Quad: AROM;Both;10 reps Knee Flexion: AROM;Both;10 reps Other Exercises Other Exercises: HEP education for RLE APs, BLE GS and QS x 10 each every 1-2 hours during the day   Assessment/Plan    PT Assessment Patient needs continued PT services  PT Problem List Decreased strength;Decreased activity tolerance;Decreased balance;Decreased knowledge of use of DME;Decreased mobility;Decreased safety awareness       PT Treatment Interventions DME instruction;Gait training;Functional mobility training;Balance training;Therapeutic exercise;Therapeutic activities;Patient/family education    PT Goals (Current goals can be found in the Care Plan section)  Acute Rehab PT Goals Patient Stated Goal: To walk better and to do work around my house again PT Goal Formulation: With patient Time For Goal Achievement: 06/02/18 Potential to Achieve Goals: Good    Frequency BID   Barriers to discharge Inaccessible home environment      Co-evaluation               AM-PAC PT "6 Clicks" Daily Activity  Outcome Measure Difficulty turning over in bed (including adjusting bedclothes, sheets and  blankets)?: None Difficulty moving from lying on back to sitting on the side of the bed? : None Difficulty sitting down on and standing up from a chair with arms (e.g., wheelchair, bedside commode, etc,.)?: Unable Help needed moving to and from a bed to chair (including a wheelchair)?: A Little Help needed walking in hospital room?: A Lot Help needed climbing 3-5 steps with a railing? : Total 6 Click Score: 15    End of Session Equipment Utilized During Treatment: Gait belt Activity Tolerance: Patient tolerated treatment well Patient left: in bed;with call bell/phone within reach;with bed alarm set(Pt declined up in chair) Nurse Communication: Mobility status PT Visit Diagnosis: Unsteadiness on feet (R26.81);Muscle weakness (generalized) (M62.81);Other abnormalities of gait and mobility (R26.89)    Time: 0930-1007 PT Time Calculation (min) (ACUTE ONLY): 37 min   Charges:   PT Evaluation $PT Eval Low Complexity: 1 Low PT Treatments $Therapeutic Exercise: 8-22 mins       D. Scott Junnie Loschiavo PT, DPT 05/20/18, 11:41 AM

## 2018-05-20 NOTE — Evaluation (Signed)
Occupational Therapy Evaluation Patient Details Name: Jenny [name] MRN: [id number] DOB: June 30, 1946 Today's Date: 05/20/2018    History of Present Illness Pt is a 72 y.o. female who presents with Left displaced distal tibia and fibula shaft fracture and is s/p IM fixation of the left distal tibia.  PMH includes: CAD, CHF, TIA, type 1 DM, anxiety, anemia, aortic valve replacement, depression, and HLD.      Clinical Impression   Pt seen for OT evaluation this date. Prior to hospital admission, pt was independent.  Pt lives with her spouse.  Currently pt demonstrates the noted impairments (please see OT problem list below) requiring MIN-MOD assist for LB ADL and CGA to MIN assist for functional mobility while maintaining TDWBing to LLE.  Pt instructed in AE/DME for ADL, falls prevention, precautions, home/routines modifications, pet care considerations, and how to implement precautions during ADL and mobility tasks. Pt would benefit from skilled OT to address noted impairments and functional limitations (see below for any additional details) in order to maximize safety and independence while minimizing falls risk and caregiver burden.  Upon hospital discharge, recommend pt discharge to [facility].     Follow Up Recommendations  SNF    Equipment Recommendations  3 in 1 bedside commode    Recommendations for Other Services       Precautions / Restrictions Precautions Precautions: Fall Restrictions Weight Bearing Restrictions: Yes LLE Weight Bearing: Touchdown weight bearing      Mobility Bed Mobility Overal bed mobility: Independent             General bed mobility comments: Good speed and effort with sup to/from sit and rolling  Transfers Overall transfer level: Needs assistance Equipment used: Rolling walker (2 wheeled) Transfers: Sit to/from Stand Sit to Stand: Min guard;From elevated surface         General transfer comment: mod verbal cues for sequencing/safety, good  carryover of precautions/compliance using strategies taught by PT in previous session    Balance Overall balance assessment: Needs assistance   Sitting balance-Leahy Scale: Normal     Standing balance support: Bilateral upper extremity supported;During functional activity Standing balance-Leahy Scale: Fair Standing balance comment: required BUE support RW                           ADL either performed or assessed with clinical judgement   ADL Overall ADL's : Needs assistance/impaired Eating/Feeding: Sitting;Independent   Grooming: Sitting;Independent   Upper Body Bathing: Sitting;Supervision/ safety   Lower Body Bathing: Sit to/from stand;Minimal assistance;Moderate assistance   Upper Body Dressing : Sitting;Supervision/safety   Lower Body Dressing: Sit to/from stand;Moderate assistance;Minimal assistance   Toilet Transfer: RW;Stand-pivot;Minimal assistance;BSC                   Vision Baseline Vision/History: Wears glasses Wears Glasses: Reading only Patient Visual Report: No change from baseline       Perception     Praxis      Pertinent Vitals/Pain Pain Assessment: 0-10 Pain Score: 4  Pain Location: L shin Pain Descriptors / Indicators: Aching Pain Intervention(s): Limited activity within patient's tolerance;Monitored during session;Repositioned;Premedicated before session     Hand Dominance Left   Extremity/Trunk Assessment Upper Extremity Assessment Upper Extremity Assessment: Overall WFL for tasks assessed(L shoulder arthritis, BUE at least 4/5)   Lower Extremity Assessment Lower Extremity Assessment: Generalized weakness;LLE deficits/detail;Defer to PT evaluation LLE Deficits / Details: L hip flex 4/5 LLE: Unable to fully assess due to  immobilization   Cervical / Trunk Assessment Cervical / Trunk Assessment: Normal   Communication Communication Communication: No difficulties   Cognition Arousal/Alertness: Awake/alert Behavior  During Therapy: WFL for tasks assessed/performed Overall Cognitive Status: Within Functional Limits for tasks assessed                                     General Comments       Exercises Other Exercises: pt educated in home/routines modifications, falls prevention strategies, precautions, and how to implement during ADL tasks Other Exercises: pt educated in use of DME/AE to improve safety/indep with ADL   Shoulder Instructions      Home Living Family/patient expects to be discharged to:: Private residence Living Arrangements: Spouse/significant other Available Help at Discharge: Family;Available 24 hours/day Type of Home: House Home Access: Stairs to enter [employer] of Steps: 4 Entrance Stairs-Rails: Right;Left;Can reach both Home Layout: Two level;Able to live on main level with bedroom/bathroom     Bathroom Shower/Tub: Tub/shower unit;Curtain   Bathroom Toilet: Standard     Home Equipment: Crutches;Walker - 2 wheels;Other (comment)(Can rent a w/c, has done so in the past)          Prior Functioning/Environment Level of Independence: Independent        Comments: Pt Ind with amb community distances without an AD, Ind with all ADLs, no other falls in the last 6 months other than current fall. After fall prior to admission, pt required assist from family for ADL        OT Problem List: Decreased strength;Decreased knowledge of use of DME or AE;Decreased range of motion;Decreased knowledge of precautions;Pain;Impaired balance (sitting and/or standing);Decreased safety awareness      OT Treatment/Interventions: Self-care/ADL training;Balance training;Therapeutic exercise;Therapeutic activities;DME and/or AE instruction;Patient/family education    OT Goals(Current goals can be found in the care plan section) Acute Rehab OT Goals Patient Stated Goal: to go to rehab, get stronger, and return home OT Goal Formulation: With patient Time For Goal  Achievement: 06/03/18 Potential to Achieve Goals: Good ADL Goals Pt Will Perform Lower Body Dressing: with modified independence;sit to/from stand;with adaptive equipment Pt Will Transfer to Toilet: with supervision;ambulating(LRAD for amb, TTWBing LLE, BSC over toilet) Pt Will Perform Tub/Shower Transfer: with min guard assist(LRAD DME)  OT Frequency: Min 2X/week   Barriers to D/C: Decreased caregiver support          Co-evaluation              AM-PAC PT "6 Clicks" Daily Activity     Outcome Measure Help from another person eating meals?: None Help from another person taking care of personal grooming?: None Help from another person toileting, which includes using toliet, bedpan, or urinal?: A Lot Help from another person bathing (including washing, rinsing, drying)?: A Lot Help from another person to put on and taking off regular upper body clothing?: None Help from another person to put on and taking off regular lower body clothing?: A Lot 6 Click Score: 18   End of Session Equipment Utilized During Treatment: Rolling walker  Activity Tolerance: Patient tolerated treatment well Patient left: in bed;with call bell/phone within reach;with bed alarm set;Other (comment);with SCD's reapplied(LLE elevated)  OT Visit Diagnosis: Other abnormalities of gait and mobility (R26.89);History of falling (Z91.81);Muscle weakness (generalized) (M62.81);Pain Pain - Right/Left: Left Pain - part of body: Leg;Ankle and joints of foot  Time: 1062-6948 OT Time Calculation (min): 27 min Charges:  OT General Charges $OT Visit: 1 Visit OT Evaluation $OT Eval Low Complexity: 1 Low OT Treatments $Self Care/Home Management : 23-37 mins  Richrd Prime, MPH, MS, OTR/L ascom [phone] 05/20/18, 4:32 PM

## 2018-05-20 NOTE — Progress Notes (Signed)
Inpatient Diabetes Program Recommendations  AACE/ADA: New Consensus Statement on Inpatient Glycemic Control (2015)  Target Ranges:  Prepandial:   less than 140 mg/dL      [facility] postprandial:   less than 180 mg/dL (1-2 hours)      Critically ill patients:  140 - 180 mg/dL   Lab Results  Component Value Date   GLUCAP 122 (H) 05/20/2018   HGBA1C 7.7 (H) 02/13/2017    Review of Glycemic ControlResults for ARYIANNA, EARWOOD (MRN [id number]) as of 05/20/2018 09:51  Ref. Range 05/19/2018 08:47 05/19/2018 14:55 05/19/2018 16:57 05/19/2018 21:24 05/20/2018 07:42  Glucose-Capillary Latest Ref Range: 70 - 99 mg/dL 81 016 (H) 010 (H) 932 (H) 122 (H)    Diabetes history: DM 2  Outpatient Diabetes medications: Lantus 35 units in the AM and 25 units in the PM Metformin 500 mg bid, Januvia 100 mg daily Current orders for Inpatient glycemic control:  Lantus 35 units int he AM and 25 units in the PM Tradjenta 5 mg daily, Metformin 500 mg bid  Inpatient Diabetes Program Recommendations:   Please add Novolog sensitive correction tid with meals and HS while in the hospital.  Fasting blood sugar=122 mg/dL.  Will follow.  If fasting < 100 mg/dL, may need reduction in home basal insulin regimen.   Thanks,  Beryl Meager, RN, BC-ADM Inpatient Diabetes Coordinator Pager [phone] (8a-5p)

## 2018-05-20 NOTE — Progress Notes (Signed)
Subjective:  POD #1 s/p IM fixation of left tibia-fibula fracture.  Patient reports left leg pain as mild at rest.  Patient has no other complaints.   Husband is at the bedside.  Objective:   VITALS:   Vitals:   05/19/18 2335 05/20/18 0421 05/20/18 0743 05/20/18 1159  BP: (!) 110/56 123/67 (!) 106/52 (!) 111/49  Pulse: 96 86 88 93  Resp: 18 18 18 18   Temp: 97.9 F (36.6 C) 98.5 F (36.9 C) 98 F (36.7 C) 97.6 F (36.4 C)  TempSrc: Oral Oral Oral Oral  SpO2: 98% 98% 96% 97%  Weight:      Height:        PHYSICAL EXAM: Left lower extremity:  Splint/dressing clean and dry.  She can flex and extend toes.   Neurovascular intact Sensation intact distally Compartment soft  LABS  Results for orders placed or performed during the hospital encounter of 05/19/18 (from the past 24 hour(s))  Glucose, capillary     Status: Abnormal   Collection Time: 05/19/18  2:55 PM  Result Value Ref Range   Glucose-Capillary 145 (H) 70 - 99 mg/dL  Glucose, capillary     Status: Abnormal   Collection Time: 05/19/18  4:57 PM  Result Value Ref Range   Glucose-Capillary 180 (H) 70 - 99 mg/dL  Glucose, capillary     Status: Abnormal   Collection Time: 05/19/18  9:24 PM  Result Value Ref Range   Glucose-Capillary 254 (H) 70 - 99 mg/dL  CBC     Status: Abnormal   Collection Time: 05/20/18  3:29 AM  Result Value Ref Range   WBC 5.4 4.0 - 10.5 K/uL   RBC 3.67 (L) 3.87 - 5.11 MIL/uL   Hemoglobin 10.2 (L) 12.0 - 15.0 g/dL   HCT 05/22/18 (L) 97.3 - 53.2 %   MCV 85.8 80.0 - 100.0 fL   MCH 27.8 26.0 - 34.0 pg   MCHC 32.4 30.0 - 36.0 g/dL   RDW 99.2 42.6 - 83.4 %   Platelets 99 (L) 150 - 400 K/uL   nRBC 0.0 0.0 - 0.2 %  Basic metabolic panel     Status: Abnormal   Collection Time: 05/20/18  3:29 AM  Result Value Ref Range   Sodium 138 135 - 145 mmol/L   Potassium 4.7 3.5 - 5.1 mmol/L   Chloride 107 98 - 111 mmol/L   CO2 25 22 - 32 mmol/L   Glucose, Bld 185 (H) 70 - 99 mg/dL   BUN 13 8 - 23 mg/dL    Creatinine, Ser 05/22/18 0.44 - 1.00 mg/dL   Calcium 7.8 (L) 8.9 - 10.3 mg/dL   GFR calc non Af Amer >60 >60 mL/min   GFR calc Af Amer >60 >60 mL/min   Anion gap 6 5 - 15  Glucose, capillary     Status: Abnormal   Collection Time: 05/20/18  7:42 AM  Result Value Ref Range   Glucose-Capillary 122 (H) 70 - 99 mg/dL  Glucose, capillary     Status: Abnormal   Collection Time: 05/20/18 11:58 AM  Result Value Ref Range   Glucose-Capillary 182 (H) 70 - 99 mg/dL    Dg Tibia/fibula Left  Result Date: 05/19/2018 CLINICAL DATA:  ORIF left hip fib. EXAM: LEFT TIBIA AND FIBULA - 2 VIEW; DG C-ARM 61-120 MIN COMPARISON:  04/29/2018. FINDINGS: ORIF left tib-fib.Hardware intact. Near anatomic alignment. Prior plate and screw fixation distal fibula appears unchanged. IMPRESSION: ORIF left tib-fib. Electronically Signed   By:  Thomas  Register   On: 05/19/2018 13:05   Dg Tibia/fibula Left Port  Result Date: 05/19/2018 CLINICAL DATA:  72 year old female with left tibia and fibular fracture. Subsequent encounter. EXAM: PORTABLE LEFT TIBIA AND FIBULA - 2 VIEW COMPARISON:  Intraoperative exam 05/19/2018.  Preop exam 04/29/2018. FINDINGS: Distal left tibial fracture has been treated with intramedullary rod with proximal and distal fixation screws. Overlying splint obscures fine osseous and soft tissue detail. Slight incongruity of fracture fragments. Proximal left fibular fracture with overlapping/foreshortening of fracture fragments. Partial healing with callus noted. Remote distal left fibular fracture treated with sideplate and screws. IMPRESSION: Post open reduction and internal fixation of distal left tibia fracture with intramedullary rod as noted above. Electronically Signed   By: Lacy Duverney M.D.   On: 05/19/2018 14:53   Dg C-arm 1-60 Min  Result Date: 05/19/2018 CLINICAL DATA:  ORIF left hip fib. EXAM: LEFT TIBIA AND FIBULA - 2 VIEW; DG C-ARM 61-120 MIN COMPARISON:  04/29/2018. FINDINGS: ORIF left  tib-fib.Hardware intact. Near anatomic alignment. Prior plate and screw fixation distal fibula appears unchanged. IMPRESSION: ORIF left tib-fib. Electronically Signed   By: Maisie Fus  Register   On: 05/19/2018 13:05    Assessment/Plan: 1 Day Post-Op   Active Problems:   Tibia/fibula fracture, left, closed, initial encounter  Continue PT and OT evaluation.   Patient has limited help at home and would benefit from SNF upon discharge.  Will follow.  Lovenox for DVT prophylaxis.    Juanell Fairly , MD 05/20/2018, 1:09 PM

## 2018-05-20 NOTE — Care Management (Signed)
RNCM met with patient to offer home health agency list. She states she will need to go to SNF at discharge. She states "my husband has a bad back and cannot take care of me". They have rented a wheelchair however it was not clear is she has a walker available for use at home.  RNCM will follow along with CSW.

## 2018-05-20 NOTE — Progress Notes (Signed)
Clinical Social Worker (CSW) met with patient and made her aware that as of today she remains inpatient. Plan is for patient to D/C to [facility] Friday 05/22/18 pending medical clearance. Patient's husband Lanny Hurst is aware of above. [facility] liaision is aware of above.  [employer], LCSW [phone]

## 2018-05-21 LAB — CBC
HEMATOCRIT: 33.7 % — AB (ref 36.0–46.0)
HEMOGLOBIN: 10.6 g/dL — AB (ref 12.0–15.0)
MCH: 27.7 pg (ref 26.0–34.0)
MCHC: 31.5 g/dL (ref 30.0–36.0)
MCV: 88 fL (ref 80.0–100.0)
NRBC: 0 % (ref 0.0–0.2)
Platelets: 100 10*3/uL — ABNORMAL LOW (ref 150–400)
RBC: 3.83 MIL/uL — ABNORMAL LOW (ref 3.87–5.11)
RDW: 14.4 % (ref 11.5–15.5)
WBC: 5.2 10*3/uL (ref 4.0–10.5)

## 2018-05-21 LAB — BASIC METABOLIC PANEL
ANION GAP: 8 (ref 5–15)
BUN: 11 mg/dL (ref 8–23)
CHLORIDE: 108 mmol/L (ref 98–111)
CO2: 24 mmol/L (ref 22–32)
Calcium: 8.2 mg/dL — ABNORMAL LOW (ref 8.9–10.3)
Creatinine, Ser: 0.53 mg/dL (ref 0.44–1.00)
GLUCOSE: 187 mg/dL — AB (ref 70–99)
Potassium: 4.4 mmol/L (ref 3.5–5.1)
Sodium: 140 mmol/L (ref 135–145)

## 2018-05-21 LAB — GLUCOSE, CAPILLARY
GLUCOSE-CAPILLARY: 147 mg/dL — AB (ref 70–99)
GLUCOSE-CAPILLARY: 117 mg/dL — AB (ref 70–99)
Glucose-Capillary: 102 mg/dL — ABNORMAL HIGH (ref 70–99)
Glucose-Capillary: 142 mg/dL — ABNORMAL HIGH (ref 70–99)

## 2018-05-21 NOTE — Progress Notes (Signed)
Plan is for patient to D/C to [facility] tomorrow pending medical clearance. Patient is aware of above. Patient's husband Mellody Dance is aware of above. Tammy admissions coordinator at [facility] is aware of above.   [employer], LCSW [phone]

## 2018-05-21 NOTE — Progress Notes (Addendum)
  Subjective:  POD #2 s/p left IM fixation for tibia fracture.  Patient reports increased leg pain today.    Objective:   VITALS:   Vitals:   05/21/18 0508 05/21/18 0729 05/21/18 1402 05/21/18 1620  BP:  (!) 123/57 (!) 124/46 128/65  Pulse:  98 96 (!) 102  Resp:      Temp: 99.3 F (37.4 C) 98.4 F (36.9 C) 98.3 F (36.8 C) 98.5 F (36.9 C)  TempSrc:  Oral Oral Oral  SpO2:  98% 99% 93%  Weight:      Height:        PHYSICAL EXAM: Left lower extremity: Neurovascular intact Sensation intact distally Intact pulses distally Dorsiflexion/Plantar flexion intact Incision: dressing C/D/I Compartment soft  LABS  Results for orders placed or performed during the hospital encounter of 05/19/18 (from the past 24 hour(s))  Glucose, capillary     Status: Abnormal   Collection Time: 05/20/18  9:13 PM  Result Value Ref Range   Glucose-Capillary 244 (H) 70 - 99 mg/dL  CBC     Status: Abnormal   Collection Time: 05/21/18  3:09 AM  Result Value Ref Range   WBC 5.2 4.0 - 10.5 K/uL   RBC 3.83 (L) 3.87 - 5.11 MIL/uL   Hemoglobin 10.6 (L) 12.0 - 15.0 g/dL   HCT 61.4 (L) 43.1 - 54.0 %   MCV 88.0 80.0 - 100.0 fL   MCH 27.7 26.0 - 34.0 pg   MCHC 31.5 30.0 - 36.0 g/dL   RDW 08.6 76.1 - 95.0 %   Platelets 100 (L) 150 - 400 K/uL   nRBC 0.0 0.0 - 0.2 %  Basic metabolic panel     Status: Abnormal   Collection Time: 05/21/18  3:09 AM  Result Value Ref Range   Sodium 140 135 - 145 mmol/L   Potassium 4.4 3.5 - 5.1 mmol/L   Chloride 108 98 - 111 mmol/L   CO2 24 22 - 32 mmol/L   Glucose, Bld 187 (H) 70 - 99 mg/dL   BUN 11 8 - 23 mg/dL   Creatinine, Ser 9.32 0.44 - 1.00 mg/dL   Calcium 8.2 (L) 8.9 - 10.3 mg/dL   GFR calc non Af Amer >60 >60 mL/min   GFR calc Af Amer >60 >60 mL/min   Anion gap 8 5 - 15  Glucose, capillary     Status: Abnormal   Collection Time: 05/21/18  7:27 AM  Result Value Ref Range   Glucose-Capillary 102 (H) 70 - 99 mg/dL  Glucose, capillary     Status: Abnormal   Collection Time: 05/21/18 11:35 AM  Result Value Ref Range   Glucose-Capillary 142 (H) 70 - 99 mg/dL  Glucose, capillary     Status: Abnormal   Collection Time: 05/21/18  4:23 PM  Result Value Ref Range   Glucose-Capillary 147 (H) 70 - 99 mg/dL    No results found.  Assessment/Plan: 2 Days Post-Op   Active Problems:   Tibia/fibula fracture, left, closed, initial encounter  Plan for patient to be discharged to skilled nursing facility tomorrow.  Patient is having slow progress with physical therapy due to leg pain.  Continue Lovenox for DVT prophylaxis.  Continue physical therapy as tolerated.  Patient is toe touch weight bearing on the left lower extremity.    Juanell Fairly , MD 05/21/2018, 5:51 PM

## 2018-05-21 NOTE — Progress Notes (Signed)
PT Cancellation Note  Patient Details Name: [name] MRN: [id number] DOB: 07/16/1945   Cancelled Treatment:    Reason Eval/Treat Not Completed: Medical issues which prohibited therapy.  Pt has some chest pain and increased Tib fracture pain, nursing aware.  Will wait and check on her at another time.   Ivar Drape 05/21/2018, 1:56 PM   Samul Dada, PT MS Acute Rehab Dept. Number: [facility] [id number] and [facility] [phone]

## 2018-05-21 NOTE — Anesthesia Postprocedure Evaluation (Signed)
Anesthesia Post Note  Patient: Jenny [name]  Procedure(s) Performed: INTRAMEDULLARY (IM) NAIL TIBIAL (Left Ankle)  Patient location during evaluation: PACU Anesthesia Type: General Level of consciousness: awake and alert Pain management: pain level controlled Vital Signs Assessment: post-procedure vital signs reviewed and stable Respiratory status: spontaneous breathing, nonlabored ventilation, respiratory function stable and patient connected to nasal cannula oxygen Cardiovascular status: blood pressure returned to baseline and stable Postop Assessment: no apparent nausea or vomiting Anesthetic complications: no     Last Vitals:  Vitals:   05/21/18 0729 05/21/18 1402  BP: (!) 123/57 (!) 124/46  Pulse: 98 96  Resp:    Temp: 36.9 C 36.8 C  SpO2: 98% 99%    Last Pain:  Vitals:   05/21/18 1402  TempSrc: Oral  PainSc:                  [name]

## 2018-05-22 DIAGNOSIS — S82892D Other fracture of left lower leg, subsequent encounter for closed fracture with routine healing: Secondary | ICD-10-CM | POA: Diagnosis not present

## 2018-05-22 DIAGNOSIS — R52 Pain, unspecified: Secondary | ICD-10-CM | POA: Diagnosis not present

## 2018-05-22 DIAGNOSIS — I10 Essential (primary) hypertension: Secondary | ICD-10-CM | POA: Diagnosis not present

## 2018-05-22 DIAGNOSIS — S82852D Displaced trimalleolar fracture of left lower leg, subsequent encounter for closed fracture with routine healing: Secondary | ICD-10-CM | POA: Diagnosis not present

## 2018-05-22 DIAGNOSIS — E1165 Type 2 diabetes mellitus with hyperglycemia: Secondary | ICD-10-CM | POA: Diagnosis not present

## 2018-05-22 DIAGNOSIS — F418 Other specified anxiety disorders: Secondary | ICD-10-CM | POA: Diagnosis not present

## 2018-05-22 DIAGNOSIS — I2510 Atherosclerotic heart disease of native coronary artery without angina pectoris: Secondary | ICD-10-CM | POA: Diagnosis not present

## 2018-05-22 DIAGNOSIS — L299 Pruritus, unspecified: Secondary | ICD-10-CM | POA: Diagnosis not present

## 2018-05-22 DIAGNOSIS — E119 Type 2 diabetes mellitus without complications: Secondary | ICD-10-CM | POA: Diagnosis not present

## 2018-05-22 DIAGNOSIS — K5909 Other constipation: Secondary | ICD-10-CM | POA: Diagnosis not present

## 2018-05-22 DIAGNOSIS — F3289 Other specified depressive episodes: Secondary | ICD-10-CM | POA: Diagnosis not present

## 2018-05-22 DIAGNOSIS — M6281 Muscle weakness (generalized): Secondary | ICD-10-CM | POA: Diagnosis not present

## 2018-05-22 DIAGNOSIS — I509 Heart failure, unspecified: Secondary | ICD-10-CM | POA: Diagnosis not present

## 2018-05-22 DIAGNOSIS — Z7401 Bed confinement status: Secondary | ICD-10-CM | POA: Diagnosis not present

## 2018-05-22 DIAGNOSIS — E7849 Other hyperlipidemia: Secondary | ICD-10-CM | POA: Diagnosis not present

## 2018-05-22 DIAGNOSIS — R05 Cough: Secondary | ICD-10-CM | POA: Diagnosis not present

## 2018-05-22 DIAGNOSIS — G458 Other transient cerebral ischemic attacks and related syndromes: Secondary | ICD-10-CM | POA: Diagnosis not present

## 2018-05-22 DIAGNOSIS — Z23 Encounter for immunization: Secondary | ICD-10-CM | POA: Diagnosis not present

## 2018-05-22 DIAGNOSIS — E039 Hypothyroidism, unspecified: Secondary | ICD-10-CM | POA: Diagnosis not present

## 2018-05-22 DIAGNOSIS — W010XXA Fall on same level from slipping, tripping and stumbling without subsequent striking against object, initial encounter: Secondary | ICD-10-CM | POA: Diagnosis not present

## 2018-05-22 DIAGNOSIS — F064 Anxiety disorder due to known physiological condition: Secondary | ICD-10-CM | POA: Diagnosis not present

## 2018-05-22 LAB — CBC
HEMATOCRIT: 33.4 % — AB (ref 36.0–46.0)
Hemoglobin: 10.6 g/dL — ABNORMAL LOW (ref 12.0–15.0)
MCH: 27.7 pg (ref 26.0–34.0)
MCHC: 31.7 g/dL (ref 30.0–36.0)
MCV: 87.2 fL (ref 80.0–100.0)
PLATELETS: 110 10*3/uL — AB (ref 150–400)
RBC: 3.83 MIL/uL — AB (ref 3.87–5.11)
RDW: 14.2 % (ref 11.5–15.5)
WBC: 4.9 10*3/uL (ref 4.0–10.5)
nRBC: 0 % (ref 0.0–0.2)

## 2018-05-22 LAB — GLUCOSE, CAPILLARY
Glucose-Capillary: 97 mg/dL (ref 70–99)
Glucose-Capillary: 109 mg/dL — ABNORMAL HIGH (ref 70–99)
Glucose-Capillary: 93 mg/dL (ref 70–99)

## 2018-05-22 MED ORDER — OXYCODONE HCL 5 MG PO TABS: 5.0000 mg | ORAL_TABLET | ORAL | 0 refills | Status: DC | PRN

## 2018-05-22 MED ORDER — ENOXAPARIN SODIUM 40 MG/0.4ML ~~LOC~~ SOLN: 40.0000 mg | SUBCUTANEOUS | 1 refills | Status: DC

## 2018-05-22 MED ORDER — DOCUSATE SODIUM 100 MG PO CAPS: 100.0000 mg | ORAL_CAPSULE | Freq: Two times a day (BID) | ORAL | 0 refills | Status: DC

## 2018-05-22 NOTE — Progress Notes (Signed)
PT Cancellation Note  Patient Details Name: Jenny [name] MRN: [id number] DOB: [birth year]   Cancelled Treatment:    Reason Eval/Treat Not Completed: Other (comment)(Patient finishing breakfast when PT entered room, expressing desire to finish breakfast prior to PT. Also hesitant to mobilize due to pending discharge to facility this PM and leg pain. PT will follow up as able.)  Olga Coaster PT, DPT 9:06 AM,05/22/18 [phone]

## 2018-05-22 NOTE — Progress Notes (Addendum)
Occupational Therapy Treatment Patient Details Name: Jenny [name] MRN: [id number] DOB: 11-Dec-1945 Today's Date: 05/22/2018    History of present illness Pt. is a 72 y.o. female who was admitted to [facility] for IM Fixation repair of a left displaced distal Tibia, and fibula shaft Fracture. PNHx includes: CAD, TIA, Type 1 DM, Anxiety, Anemia, Aortic Valve Replacement, Depression, and HLD.   OT comments  Upon arrival pt. was assisted with repositioning back to supine in bed from the EOB along with PT, and nursing. Pt. required Total Assist. Pt. education was provided about A/E use for LE ADLs. Pt. Was able to demonstrate reacher use for doffing sock with minA, and sockaide use with cues and mod Assist. Pt. Could benefit from continued OT services for ADL training, A/E training, and pt. Education about home modification, and DME. Pt. would benefitt from SNF level of care upon discharge. Pt. Could benefit from follow-up OT services at discharge.   Follow Up Recommendations    SNF   Equipment Recommendations  3 in 1 bedside commode    Recommendations for Other Services      Precautions / Restrictions Precautions Precautions: Fall Restrictions Weight Bearing Restrictions: Yes LLE Weight Bearing: Partial weight bearing       Mobility Transfers    Deferred. Refer to above.     Balance                         ADL either performed or assessed with clinical judgement   ADL Overall ADL's : Needs assistance/impaired Eating/Feeding: Independent;Bed level   Grooming: Bed level;Independent   Upper Body Bathing: Bed level;Supervision/ safety   Lower Body Bathing: Moderate assistance;Bed level   Upper Body Dressing : Minimal assistance;Bed level   Lower Body Dressing: Moderate assistance;Bed level                 General ADL Comments: Pt. education was provided about A/E use for LE ADls.     Vision Baseline Vision/History: Wears glasses Wears Glasses: Reading  only Patient Visual Report: No change from baseline     Perception     Praxis      Cognition Arousal/Alertness: Awake/alert Behavior During Therapy: WFL for tasks assessed/performed Overall Cognitive Status: Within Functional Limits for tasks assessed                                          Exercises   Shoulder Instructions       General Comments      Pertinent Vitals/ Pain       Pain Assessment: 0-10 Pain Score: 4  Pain Location: LLE/ankle Pain Descriptors / Indicators: Sore;Tender;Throbbing Pain Intervention(s): Limited activity within patient's tolerance;Monitored during session;Premedicated before session;Repositioned  Home Living                                          Prior Functioning/Environment              Frequency           Progress Toward Goals  OT Goals(current goals can now be found in the care plan section)  Progress towards OT goals: OT to reassess next treatment  Acute Rehab OT Goals Patient Stated Goal: To for to SNF for Rehab. OT Goal  Formulation: With patient Potential to Achieve Goals: Good  Plan      Co-evaluation                 AM-PAC PT "6 Clicks" Daily Activity     Outcome Measure   Help from another person eating meals?: None Help from another person taking care of personal grooming?: None Help from another person toileting, which includes using toliet, bedpan, or urinal?: A Lot Help from another person bathing (including washing, rinsing, drying)?: A Lot Help from another person to put on and taking off regular upper body clothing?: None Help from another person to put on and taking off regular lower body clothing?: A Lot 6 Click Score: 18    End of Session Equipment Utilized During Treatment: Rolling walker  Pain - Right/Left: Left Pain - part of body: Leg;Ankle and joints of foot   Activity Tolerance Patient tolerated treatment well   Patient Left in bed;with bed  alarm set;with call bell/phone within reach   Nurse Communication          Time: 9476-5465 OT Time Calculation (min): 23 min  Charges: OT General Charges $OT Visit: 1 Visit OT Treatments $Self Care/Home Management : 8-22 mins   Olegario Messier, MS, OTR/L   Olegario Messier 05/22/2018, 11:27 AM

## 2018-05-22 NOTE — Progress Notes (Addendum)
  Subjective:  POD #3 s/p IM fixation for left tibia fracture.  Patient reports left leg pain is better today.  She was able to get out of bed today with PT.    Objective:   VITALS:   Vitals:   05/21/18 1402 05/21/18 1620 05/22/18 0034 05/22/18 0838  BP: (!) 124/46 128/65 (!) 120/56 (!) 136/58  Pulse: 96 (!) 102 (!) 110 (!) 107  Resp:    18  Temp: 98.3 F (36.8 C) 98.5 F (36.9 C) 98.2 F (36.8 C) 98.7 F (37.1 C)  TempSrc: Oral Oral Oral Axillary  SpO2: 99% 93% 96% 97%  Weight:      Height:        PHYSICAL EXAM: Left lower extremity:Patient can flex and extend her toes.  Toes are well perfused with intact sensation.  Neurovascular intact Sensation intact distally Incision: dressing C/D/I Compartment soft  LABS  Results for orders placed or performed during the hospital encounter of 05/19/18 (from the past 24 hour(s))  Glucose, capillary     Status: Abnormal   Collection Time: 05/21/18  4:23 PM  Result Value Ref Range   Glucose-Capillary 147 (H) 70 - 99 mg/dL  Glucose, capillary     Status: Abnormal   Collection Time: 05/21/18  9:06 PM  Result Value Ref Range   Glucose-Capillary 117 (H) 70 - 99 mg/dL  CBC     Status: Abnormal   Collection Time: 05/22/18  5:14 AM  Result Value Ref Range   WBC 4.9 4.0 - 10.5 K/uL   RBC 3.83 (L) 3.87 - 5.11 MIL/uL   Hemoglobin 10.6 (L) 12.0 - 15.0 g/dL   HCT 64.4 (L) 03.4 - 74.2 %   MCV 87.2 80.0 - 100.0 fL   MCH 27.7 26.0 - 34.0 pg   MCHC 31.7 30.0 - 36.0 g/dL   RDW 59.5 63.8 - 75.6 %   Platelets 110 (L) 150 - 400 K/uL   nRBC 0.0 0.0 - 0.2 %  Glucose, capillary     Status: None   Collection Time: 05/22/18  7:37 AM  Result Value Ref Range   Glucose-Capillary 97 70 - 99 mg/dL   Comment 1 Notify RN   Glucose, capillary     Status: Abnormal   Collection Time: 05/22/18 11:45 AM  Result Value Ref Range   Glucose-Capillary 109 (H) 70 - 99 mg/dL   Comment 1 Notify RN     No results found.  Assessment/Plan: 3 Days Post-Op    Active Problems:   Tibia/fibula fracture, left, closed, initial encounter  D/C to SNF today.  Continue toe touch bearing only on the left lower extremity.  Patient should continue to elevate the left lower extremity.  She should follow-up in 10 to 14 days.  Remain on Lovenox 40 mg daily for DVT prophylaxis until her follow-up.    Juanell Fairly , MD 05/22/2018, 1:35 PM

## 2018-05-22 NOTE — Care Management Important Message (Signed)
Copy of signed IM left with patient in room.  

## 2018-05-22 NOTE — Clinical Social Work Note (Signed)
Patient to be d/c'ed today to [facility] room 607.  Patient and family agreeable to plans will transport via ems RN to call report, [phone].  Patient stated she will notify her husband once EMS has picked her up.  Windell Moulding, MSW, [facility] [phone]

## 2018-05-22 NOTE — Clinical Social Work Placement (Signed)
   CLINICAL SOCIAL WORK PLACEMENT  NOTE  Date:  05/22/2018  Patient Details  Name: Jenny [name] MRN: [id number] Date of Birth: 02/17/1946  Clinical Social Work is seeking post-discharge placement for this patient at the Skilled  Nursing Facility level of care (*CSW will initial, date and re-position this form in  chart as items are completed):  Yes   Patient/family provided with [facility] Clinical Social Work Department's list of facilities offering this level of care within the geographic area requested by the patient (or if unable, by the patient's family).  Yes   Patient/family informed of their freedom to choose among providers that offer the needed level of care, that participate in Medicare, Medicaid or managed care program needed by the patient, have an available bed and are willing to accept the patient.  Yes   Patient/family informed of [facility]'s ownership interest in [facility] and [facility], as well as of the fact that they are under no obligation to receive care at these facilities.  PASRR submitted to EDS on       PASRR number received on       Existing PASRR number confirmed on 05/19/18     FL2 transmitted to all facilities in geographic area requested by pt/family on 05/19/18     FL2 transmitted to all facilities within larger geographic area on       Patient informed that his/her managed care company has contracts with or will negotiate with certain facilities, including the following:        Yes   Patient/family informed of bed offers received.  Patient [facility] bed at [facility]     Physician recommends and patient chooses bed at      Patient to be transferred to [facility] [place] on 05/22/18.  Patient to be transferred to facility by [facility]     Patient family notified on 05/22/18 of transfer.  Name of family member notified:  Patient stated she will notify her husband once she arrives at [facility].     PHYSICIAN Please sign FL2, Please prepare priority discharge summary, including medications     Additional Comment:    _______________________________________________ Darleene Cleaver, LCSWA 05/22/2018, 3:00 PM

## 2018-05-22 NOTE — Discharge Summary (Signed)
Physician Discharge Summary  Patient ID: Jenny [name] MRN: [id number] DOB/AGE: 01-24-1946 72 y.o.  Admit date: 05/19/2018 Discharge date: 05/22/2018  Admission Diagnoses:  DISPLACED TRIMALLEOLAR FRACTURE OF LEFT LOWER LEG, SUBSEQUENTENCOUNTER FOR CLOSED FRACTURE WITH ROUTINE HEALING <principal problem not specified>  Discharge Diagnoses:  DISPLACED TRIMALLEOLAR FRACTURE OF LEFT LOWER LEG, SUBSEQUENTENCOUNTER FOR CLOSED FRACTURE WITH ROUTINE HEALING Active Problems:   Tibia/fibula fracture, left, closed, initial encounter s/p intramedullary fixation of left tibia fracture  Past Medical History:  Diagnosis Date  . Anxiety   . Arthritis   . CAD (coronary artery disease)    a. 01/2017 Cath: nonobs dzs.  . Chronic systolic CHF (congestive heart failure) (HCC)    a. TTE 12/2016, EF 45-50%, probable hypokinesis of the mid apical anteroseptal, anterior, & apical myocardium, GR2DD, possibly bicuspid aortic valve that was moderately thickened w/ severely calcified leaflets, severe aortic stenosis w/ mean gradient 47 mmHg, valve area 0.52 cm, mild MR, mildly dilated LA  . Depression   . Diabetes mellitus with complication (HCC)    Tylenol  . Hyperlipidemia   . Hypothyroidism   . IBS (irritable bowel syndrome)   . Iron deficiency anemia    a. s/p pRBC x1 in 12/2016  . Microcytic anemia   . Panic attacks   . Severe aortic stenosis    a. TTE 12/2016: possibly bicuspid aortic valve that was moderately thickened with severely calcified leaflets. There was severe aortic stenosis with a mean gradient of 47 mmHg and valve area of 0.52 cm; b. 01/2017 s/p AVR w/ 19 mm bioprosthetic Magna Ease pericardial tissue valve, ser# [id number].  . Stroke (HCC) 2001  . TIA (transient ischemic attack) 2006    Surgeries: Procedure(s): INTRAMEDULLARY (IM) NAIL TIBIAL on 05/19/2018   Consultants (if any):   Discharged Condition: Improved  Hospital Course: Jenny [name] is an 72 y.o. female who was  admitted 05/19/2018 with a diagnosis of  DISPLACED TRIMALLEOLAR FRACTURE OF LEFT LOWER LEG, SUBSEQUENTENCOUNTER FOR CLOSED FRACTURE WITH ROUTINE HEALING <principal problem not specified> and went to the operating room on 05/19/2018 and underwent the above named procedures.    She was given perioperative antibiotics:  Anti-infectives (From admission, onward)   Start     Dose/Rate Route Frequency Ordered Stop   05/19/18 1630  ceFAZolin (ANCEF) IVPB 1 g/50 mL premix     1 g 100 mL/hr over 30 Minutes Intravenous Every 6 hours 05/19/18 1627 05/19/18 2206   05/19/18 0847  ceFAZolin (ANCEF) 2-4 GM/100ML-% IVPB    Note to Pharmacy:  Sharlot Gowda   : cabinet override      05/19/18 0847 05/19/18 1025   05/19/18 0600  ceFAZolin (ANCEF) IVPB 2g/100 mL premix     2 g 200 mL/hr over 30 Minutes Intravenous On call to O.R. 05/18/18 2223 05/19/18 1025    .  She was given sequential compression devices, early ambulation, and lovenox for DVT prophylaxis.  Patient has issues with pain post-op.  Patient made slow progress with physical therapy as an inpatient.  However given her clinical improvement by postop day #3 she was prepared for discharge to skilled nursing facility.  She benefited maximally from the hospital stay and there were no complications.    Recent vital signs:  Vitals:   05/22/18 0034 05/22/18 0838  BP: (!) 120/56 (!) 136/58  Pulse: (!) 110 (!) 107  Resp:  18  Temp: 98.2 F (36.8 C) 98.7 F (37.1 C)  SpO2: 96% 97%    Recent laboratory studies:  Lab Results  Component Value Date   HGB 10.6 (L) 05/22/2018   HGB 10.6 (L) 05/21/2018   HGB 10.2 (L) 05/20/2018   Lab Results  Component Value Date   WBC 4.9 05/22/2018   PLT 110 (L) 05/22/2018   Lab Results  Component Value Date   INR 1.14 05/19/2018   Lab Results  Component Value Date   NA 140 05/21/2018   K 4.4 05/21/2018   CL 108 05/21/2018   CO2 24 05/21/2018   BUN 11 05/21/2018   CREATININE 0.53 05/21/2018   GLUCOSE  187 (H) 05/21/2018    Discharge Medications:   Allergies as of 05/22/2018      Reactions   Iodine Swelling   ANGIOEDEMA FACIAL SWELLING "BETADINE OKAY"   Shellfish Allergy Anaphylaxis      Medication List    STOP taking these medications   oxyCODONE-acetaminophen 5-325 MG tablet Commonly known as:  PERCOCET/ROXICET     TAKE these medications   ALPRAZolam 0.5 MG tablet Commonly known as:  XANAX Take 0.5 mg by mouth 2 (two) times daily.   aspirin EC 81 MG tablet Take 81 mg by mouth daily.   docusate sodium 100 MG capsule Commonly known as:  COLACE Take 1 capsule (100 mg total) by mouth 2 (two) times daily.   enoxaparin 40 MG/0.4ML injection Commonly known as:  LOVENOX Inject 0.4 mLs (40 mg total) into the skin daily. Start taking on:  05/23/2018   hydrOXYzine 25 MG tablet Commonly known as:  ATARAX/VISTARIL Take 25 mg by mouth 2 (two) times daily.   insulin glargine 100 UNIT/ML injection Commonly known as:  LANTUS Inject 25-35 Units into the skin See admin instructions. 35 units every morning and 25 units at bedtime   levothyroxine 88 MCG tablet Commonly known as:  SYNTHROID, LEVOTHROID Take 88 mcg by mouth daily.   metFORMIN 500 MG tablet Commonly known as:  GLUCOPHAGE Take 500 mg by mouth 2 (two) times daily.   oxyCODONE 5 MG immediate release tablet Commonly known as:  Oxy IR/ROXICODONE Take 1-2 tablets (5-10 mg total) by mouth every 4 (four) hours as needed for moderate pain (pain score 4-6).   rosuvastatin 10 MG tablet Commonly known as:  CRESTOR Take 1 tablet (10 mg total) by mouth daily.   sertraline 100 MG tablet Commonly known as:  ZOLOFT Take 100 mg by mouth 2 (two) times daily.   sitaGLIPtin 100 MG tablet Commonly known as:  JANUVIA Take 100 mg by mouth daily.       Diagnostic Studies: Dg Tibia/fibula Left  Result Date: 05/19/2018 CLINICAL DATA:  ORIF left hip fib. EXAM: LEFT TIBIA AND FIBULA - 2 VIEW; DG C-ARM 61-120 MIN COMPARISON:   04/29/2018. FINDINGS: ORIF left tib-fib.Hardware intact. Near anatomic alignment. Prior plate and screw fixation distal fibula appears unchanged. IMPRESSION: ORIF left tib-fib. Electronically Signed   By: Maisie Fus  Register   On: 05/19/2018 13:05   Dg Tibia/fibula Left  Result Date: 04/29/2018 CLINICAL DATA:  72 year old female with left lower leg pain status post fall EXAM: LEFT TIBIA AND FIBULA - 2 VIEW COMPARISON:  Prior radiographs of the left ankle 10/23/2014 FINDINGS: Acute obliquely oriented fracture through the mid fibular diaphysis. Additionally, there is an acute obliquely oriented is slightly comminuted fracture of the distal tibial diaphysis. Surgical changes of prior ORIF of now healed distal fibula and medial malleolar fractures are again noted. No evidence of hardware complication. Mild atherosclerotic calcifications visualized in the runoff arteries. IMPRESSION: 1. Acute obliquely oriented  and mildly comminuted fracture through the distal tibial diaphysis. 2. Acute obliquely oriented fracture through the mid fibular diaphysis. 3. Surgical changes of prior ORIF of now healed distal fibular fracture and medial malleolus fracture without evidence of hardware complication. 4. Atherosclerotic vascular calcifications. Electronically Signed   By: Malachy Moan M.D.   On: 04/29/2018 14:13   Dg Tibia/fibula Left Port  Result Date: 05/19/2018 CLINICAL DATA:  72 year old female with left tibia and fibular fracture. Subsequent encounter. EXAM: PORTABLE LEFT TIBIA AND FIBULA - 2 VIEW COMPARISON:  Intraoperative exam 05/19/2018.  Preop exam 04/29/2018. FINDINGS: Distal left tibial fracture has been treated with intramedullary rod with proximal and distal fixation screws. Overlying splint obscures fine osseous and soft tissue detail. Slight incongruity of fracture fragments. Proximal left fibular fracture with overlapping/foreshortening of fracture fragments. Partial healing with callus noted. Remote  distal left fibular fracture treated with sideplate and screws. IMPRESSION: Post open reduction and internal fixation of distal left tibia fracture with intramedullary rod as noted above. Electronically Signed   By: Lacy Duverney M.D.   On: 05/19/2018 14:53   Dg C-arm 1-60 Min  Result Date: 05/19/2018 CLINICAL DATA:  ORIF left hip fib. EXAM: LEFT TIBIA AND FIBULA - 2 VIEW; DG C-ARM 61-120 MIN COMPARISON:  04/29/2018. FINDINGS: ORIF left tib-fib.Hardware intact. Near anatomic alignment. Prior plate and screw fixation distal fibula appears unchanged. IMPRESSION: ORIF left tib-fib. Electronically Signed   By: Maisie Fus  Register   On: 05/19/2018 13:05    Disposition: Discharge disposition: 01-Home or Self Care       Discharge Instructions    Call MD / Call 911   Complete by:  As directed    If you experience chest pain or shortness of breath, CALL 911 and be transported to the hospital emergency room.  If you develope a fever above 101 F, pus (white drainage) or increased drainage or redness at the wound, or calf pain, call your surgeon's office.   Constipation Prevention   Complete by:  As directed    Drink plenty of fluids.  Prune juice may be helpful.  You may use a stool softener, such as Colace (over the counter) 100 mg twice a day.  Use MiraLax (over the counter) for constipation as needed.   Diet - regular Complete by:  As directed    Discharge instructions   Complete by:  As directed    Patient is toe-touch weightbearing on the left lower extremity x 4-6 weeks post-op.  Patient should elevate her left lower extremity whenever possible.  She will take Lovenox 40 mg daily for DVT prophylaxis.  Should continue physical therapy at skilled nursing facility.  She will follow-up in my office in 10 to 14 days.   Driving restrictions   Complete by:  As directed    No driving for 6-8 weeks   Increase activity slowly as tolerated   Complete by:  As directed    Lifting restrictions   Complete  by:  As directed    No lifting for 12-16 weeks      Contact information for after-discharge care    Destination    [facility] [place] [facility] Preferred SNF .   Service:  Skilled Nursing Contact information: [address] [place] [address] [phone]               Signed: Juanell Fairly ,MD 05/22/2018, 1:42 PM

## 2018-05-22 NOTE — Progress Notes (Addendum)
Physical Therapy Treatment Patient Details Name: Jenny [name] MRN: [id number] DOB: [birth year] Today's Date: 05/22/2018    History of Present Illness Pt is a 72 y.o. female who presents with Left displaced distal tibia and fibula shaft fracture and is s/p IM fixation of the left distal tibia.  PMH includes: CAD, CHF, TIA, type 1 DM, anxiety, anemia, aortic valve replacement, depression, and HLD.       PT Comments    PT spoke with Dr. Martha Clan to confirm TTWB precautions on LLE. Patient with nursing at bedside, agreeable to PT, stated that at rest her pain is 3-4/10 with mobility her pain increases significantly. Patient able to participate in bed level exercises with minimal verbal cues, AAROM for SLR on LLE due to pain. Patient mobilized to EOB with very little physical assist needed to allow LLE to slide towards EOB. Sit <> stand with CGA and good adherence and knowledge of precautions (TTWB ON LLE). Patient able to take 2-3 small hops on RLE with RW and CGA, and began to complaint of significant arm weakness and LE weakness. MaxAx1 for controlled descent to bed. Patient in NAD, just complaints of fatigue. Nursing staff entered room to assist repositioning pt in bed/assess patient. OT in room at end of session. The patient would benefit from further skilled PT to continue to progress towards goals to maximize independence, mobility, and safety.   Follow Up Recommendations  SNF     Equipment Recommendations  None recommended by PT    Recommendations for Other Services       Precautions / Restrictions Precautions Precautions: Fall Restrictions Weight Bearing Restrictions: Yes LLE Weight Bearing: Partial weight bearing    Mobility  Bed Mobility Overal bed mobility: Needs Assistance Bed Mobility: Supine to Sit     Supine to sit: Min assist     General bed mobility comments: Very very minimal physical assist needed to help LLE slide on bed   Transfers Overall transfer  level: Needs assistance Equipment used: Rolling walker (2 wheeled) Transfers: Sit to/from Stand Sit to Stand: Min guard         General transfer comment: Pt with good knowledge of weight bearing precautions  Ambulation/Gait     Assistive device: Rolling walker (2 wheeled)   Gait velocity: Decreased   General Gait Details: Patient with hop to pattern, difficulty with controlling AD due to complaints of arm weakness. min-modAx1 during ambulation due to instability.    Stairs             Wheelchair Mobility    Modified Rankin (Stroke Patients Only)       Balance Overall balance assessment: Needs assistance   Sitting balance-Leahy Scale: Normal       Standing balance-Leahy Scale: Zero                              Cognition Arousal/Alertness: Awake/alert Behavior During Therapy: WFL for tasks assessed/performed Overall Cognitive Status: Within Functional Limits for tasks assessed                                        Exercises Total Joint Exercises Ankle Circles/Pumps: Right;10 reps;AROM Gluteal Sets: Strengthening;Both;10 reps Heel Slides: AROM;Right;10 reps Hip ABduction/ADduction: AROM;Both;10 reps Straight Leg Raises: AAROM;Left;10 reps;Strengthening;AROM;Right    General Comments        Pertinent Vitals/Pain Pain Assessment:  0-10 Pain Score: 4  Pain Location: LLE/ankle Pain Descriptors / Indicators: Sore;Tender;Throbbing Pain Intervention(s): Limited activity within patient's tolerance;Monitored during session;Repositioned    Home Living                      Prior Function            PT Goals (current goals can now be found in the care plan section) Progress towards PT goals: Progressing toward goals(slowly)    Frequency    BID      PT Plan Current plan remains appropriate    Co-evaluation              AM-PAC PT "6 Clicks" Daily Activity  Outcome Measure  Difficulty turning over  in bed (including adjusting bedclothes, sheets and blankets)?: None Difficulty moving from lying on back to sitting on the side of the bed? : None Difficulty sitting down on and standing up from a chair with arms (e.g., wheelchair, bedside commode, etc,.)?: Unable Help needed moving to and from a bed to chair (including a wheelchair)?: A Lot Help needed walking in hospital room?: A Lot Help needed climbing 3-5 steps with a railing? : Total 6 Click Score: 14    End of Session Equipment Utilized During Treatment: Gait belt Activity Tolerance: Patient tolerated treatment well Patient left: with call bell/phone within reach;in bed;Other (comment)(with OT present) Nurse Communication: Mobility status PT Visit Diagnosis: Unsteadiness on feet (R26.81);Muscle weakness (generalized) (M62.81);Other abnormalities of gait and mobility (R26.89)     Time: 1062-6948 PT Time Calculation (min) (ACUTE ONLY): 28 min  Charges:  $Therapeutic Exercise: 8-22 mins $Therapeutic Activity: 8-22 mins                     Olga Coaster PT, DPT 10:48 AM,05/22/18 [phone]

## 2018-05-26 DIAGNOSIS — E039 Hypothyroidism, unspecified: Secondary | ICD-10-CM | POA: Diagnosis not present

## 2018-05-26 DIAGNOSIS — F418 Other specified anxiety disorders: Secondary | ICD-10-CM | POA: Diagnosis not present

## 2018-05-26 DIAGNOSIS — R05 Cough: Secondary | ICD-10-CM | POA: Diagnosis not present

## 2018-05-26 DIAGNOSIS — I2510 Atherosclerotic heart disease of native coronary artery without angina pectoris: Secondary | ICD-10-CM | POA: Diagnosis not present

## 2018-05-26 DIAGNOSIS — E119 Type 2 diabetes mellitus without complications: Secondary | ICD-10-CM | POA: Diagnosis not present

## 2018-05-26 DIAGNOSIS — I509 Heart failure, unspecified: Secondary | ICD-10-CM | POA: Diagnosis not present

## 2018-05-26 DIAGNOSIS — S82892D Other fracture of left lower leg, subsequent encounter for closed fracture with routine healing: Secondary | ICD-10-CM | POA: Diagnosis not present

## 2018-05-26 DIAGNOSIS — I10 Essential (primary) hypertension: Secondary | ICD-10-CM | POA: Diagnosis not present

## 2018-06-02 ENCOUNTER — Ambulatory Visit: Admit: 2018-06-02 | Payer: Medicare Other | Admitting: Ophthalmology

## 2018-06-02 SURGERY — PHACOEMULSIFICATION, CATARACT, WITH IOL INSERTION
Anesthesia: Choice | Laterality: Left

## 2018-06-03 DIAGNOSIS — F418 Other specified anxiety disorders: Secondary | ICD-10-CM | POA: Diagnosis not present

## 2018-06-03 DIAGNOSIS — I2510 Atherosclerotic heart disease of native coronary artery without angina pectoris: Secondary | ICD-10-CM | POA: Diagnosis not present

## 2018-06-03 DIAGNOSIS — R05 Cough: Secondary | ICD-10-CM | POA: Diagnosis not present

## 2018-06-03 DIAGNOSIS — E039 Hypothyroidism, unspecified: Secondary | ICD-10-CM | POA: Diagnosis not present

## 2018-06-03 DIAGNOSIS — S82852D Displaced trimalleolar fracture of left lower leg, subsequent encounter for closed fracture with routine healing: Secondary | ICD-10-CM | POA: Diagnosis not present

## 2018-06-03 DIAGNOSIS — S82892D Other fracture of left lower leg, subsequent encounter for closed fracture with routine healing: Secondary | ICD-10-CM | POA: Diagnosis not present

## 2018-06-03 DIAGNOSIS — I509 Heart failure, unspecified: Secondary | ICD-10-CM | POA: Diagnosis not present

## 2018-06-03 DIAGNOSIS — E119 Type 2 diabetes mellitus without complications: Secondary | ICD-10-CM | POA: Diagnosis not present

## 2018-06-03 DIAGNOSIS — I10 Essential (primary) hypertension: Secondary | ICD-10-CM | POA: Diagnosis not present

## 2018-07-09 DIAGNOSIS — I10 Essential (primary) hypertension: Secondary | ICD-10-CM | POA: Diagnosis not present

## 2018-07-09 DIAGNOSIS — K219 Gastro-esophageal reflux disease without esophagitis: Secondary | ICD-10-CM | POA: Diagnosis not present

## 2018-07-09 DIAGNOSIS — E119 Type 2 diabetes mellitus without complications: Secondary | ICD-10-CM | POA: Diagnosis not present

## 2018-07-09 DIAGNOSIS — M354 Diffuse (eosinophilic) fasciitis: Secondary | ICD-10-CM | POA: Diagnosis not present

## 2018-07-22 DIAGNOSIS — S82202D Unspecified fracture of shaft of left tibia, subsequent encounter for closed fracture with routine healing: Secondary | ICD-10-CM | POA: Diagnosis not present

## 2018-08-10 DIAGNOSIS — K219 Gastro-esophageal reflux disease without esophagitis: Secondary | ICD-10-CM | POA: Diagnosis not present

## 2018-08-10 DIAGNOSIS — E785 Hyperlipidemia, unspecified: Secondary | ICD-10-CM | POA: Diagnosis not present

## 2018-08-10 DIAGNOSIS — E119 Type 2 diabetes mellitus without complications: Secondary | ICD-10-CM | POA: Diagnosis not present

## 2018-08-10 DIAGNOSIS — I10 Essential (primary) hypertension: Secondary | ICD-10-CM | POA: Diagnosis not present

## 2018-09-01 ENCOUNTER — Encounter: Payer: Self-pay | Admitting: *Deleted

## 2018-09-01 DIAGNOSIS — E113213 Type 2 diabetes mellitus with mild nonproliferative diabetic retinopathy with macular edema, bilateral: Secondary | ICD-10-CM | POA: Diagnosis not present

## 2018-09-14 DIAGNOSIS — K219 Gastro-esophageal reflux disease without esophagitis: Secondary | ICD-10-CM | POA: Diagnosis not present

## 2018-09-14 DIAGNOSIS — M354 Diffuse (eosinophilic) fasciitis: Secondary | ICD-10-CM | POA: Diagnosis not present

## 2018-09-14 DIAGNOSIS — I10 Essential (primary) hypertension: Secondary | ICD-10-CM | POA: Diagnosis not present

## 2018-09-14 DIAGNOSIS — E119 Type 2 diabetes mellitus without complications: Secondary | ICD-10-CM | POA: Diagnosis not present

## 2018-10-20 DIAGNOSIS — M354 Diffuse (eosinophilic) fasciitis: Secondary | ICD-10-CM | POA: Diagnosis not present

## 2018-10-20 DIAGNOSIS — I10 Essential (primary) hypertension: Secondary | ICD-10-CM | POA: Diagnosis not present

## 2018-10-20 DIAGNOSIS — E119 Type 2 diabetes mellitus without complications: Secondary | ICD-10-CM | POA: Diagnosis not present

## 2018-10-20 DIAGNOSIS — K219 Gastro-esophageal reflux disease without esophagitis: Secondary | ICD-10-CM | POA: Diagnosis not present

## 2018-12-08 DIAGNOSIS — H35352 Cystoid macular degeneration, left eye: Secondary | ICD-10-CM | POA: Diagnosis not present

## 2018-12-09 DIAGNOSIS — E113213 Type 2 diabetes mellitus with mild nonproliferative diabetic retinopathy with macular edema, bilateral: Secondary | ICD-10-CM | POA: Diagnosis not present

## 2018-12-11 DIAGNOSIS — E7849 Other hyperlipidemia: Secondary | ICD-10-CM | POA: Diagnosis not present

## 2018-12-11 DIAGNOSIS — E034 Atrophy of thyroid (acquired): Secondary | ICD-10-CM | POA: Diagnosis not present

## 2018-12-11 DIAGNOSIS — M354 Diffuse (eosinophilic) fasciitis: Secondary | ICD-10-CM | POA: Diagnosis not present

## 2018-12-11 DIAGNOSIS — E119 Type 2 diabetes mellitus without complications: Secondary | ICD-10-CM | POA: Diagnosis not present

## 2018-12-11 DIAGNOSIS — I10 Essential (primary) hypertension: Secondary | ICD-10-CM | POA: Diagnosis not present

## 2018-12-11 DIAGNOSIS — K219 Gastro-esophageal reflux disease without esophagitis: Secondary | ICD-10-CM | POA: Diagnosis not present

## 2018-12-11 DIAGNOSIS — R5381 Other malaise: Secondary | ICD-10-CM | POA: Diagnosis not present

## 2018-12-22 ENCOUNTER — Other Ambulatory Visit: Payer: Self-pay

## 2018-12-22 ENCOUNTER — Encounter: Payer: Self-pay | Admitting: *Deleted

## 2018-12-22 DIAGNOSIS — R011 Cardiac murmur, unspecified: Secondary | ICD-10-CM | POA: Diagnosis not present

## 2018-12-22 DIAGNOSIS — H2512 Age-related nuclear cataract, left eye: Secondary | ICD-10-CM | POA: Diagnosis not present

## 2018-12-25 ENCOUNTER — Other Ambulatory Visit: Payer: Self-pay

## 2018-12-25 ENCOUNTER — Other Ambulatory Visit
Admission: RE | Admit: 2018-12-25 | Discharge: 2018-12-25 | Disposition: A | Discharge status: Home / Self Care | Payer: Medicare Other | Source: Ambulatory Visit | Attending: Ophthalmology | Admitting: Ophthalmology

## 2018-12-25 DIAGNOSIS — Z1159 Encounter for screening for other viral diseases: Secondary | ICD-10-CM | POA: Insufficient documentation

## 2018-12-25 NOTE — Discharge Instructions (Signed)

## 2018-12-26 LAB — NOVEL CORONAVIRUS, NAA (HOSP ORDER, SEND-OUT TO REF LAB; TAT 18-24 HRS): SARS-CoV-2, NAA: NOT DETECTED

## 2018-12-29 ENCOUNTER — Other Ambulatory Visit: Payer: Self-pay

## 2018-12-29 ENCOUNTER — Ambulatory Visit: Payer: Medicare Other | Admitting: Anesthesiology

## 2018-12-29 ENCOUNTER — Encounter
Admission: RE | Disposition: A | Discharge status: Home / Self Care | Payer: Self-pay | Source: Home / Self Care | Attending: Ophthalmology

## 2018-12-29 ENCOUNTER — Ambulatory Visit
Admission: RE | Admit: 2018-12-29 | Discharge: 2018-12-29 | Disposition: A | Discharge status: Home / Self Care | Payer: Medicare Other | Attending: Ophthalmology | Admitting: Ophthalmology

## 2018-12-29 DIAGNOSIS — Z8673 Personal history of transient ischemic attack (TIA), and cerebral infarction without residual deficits: Secondary | ICD-10-CM | POA: Diagnosis not present

## 2018-12-29 DIAGNOSIS — M1990 Unspecified osteoarthritis, unspecified site: Secondary | ICD-10-CM | POA: Diagnosis not present

## 2018-12-29 DIAGNOSIS — I2510 Atherosclerotic heart disease of native coronary artery without angina pectoris: Secondary | ICD-10-CM | POA: Diagnosis not present

## 2018-12-29 DIAGNOSIS — E039 Hypothyroidism, unspecified: Secondary | ICD-10-CM | POA: Insufficient documentation

## 2018-12-29 DIAGNOSIS — F418 Other specified anxiety disorders: Secondary | ICD-10-CM | POA: Insufficient documentation

## 2018-12-29 DIAGNOSIS — E1136 Type 2 diabetes mellitus with diabetic cataract: Secondary | ICD-10-CM | POA: Diagnosis not present

## 2018-12-29 DIAGNOSIS — Z888 Allergy status to other drugs, medicaments and biological substances status: Secondary | ICD-10-CM | POA: Insufficient documentation

## 2018-12-29 DIAGNOSIS — Z794 Long term (current) use of insulin: Secondary | ICD-10-CM | POA: Diagnosis not present

## 2018-12-29 DIAGNOSIS — Z7983 Long term (current) use of bisphosphonates: Secondary | ICD-10-CM | POA: Diagnosis not present

## 2018-12-29 DIAGNOSIS — H25812 Combined forms of age-related cataract, left eye: Secondary | ICD-10-CM | POA: Diagnosis not present

## 2018-12-29 DIAGNOSIS — H2512 Age-related nuclear cataract, left eye: Secondary | ICD-10-CM | POA: Diagnosis not present

## 2018-12-29 DIAGNOSIS — I509 Heart failure, unspecified: Secondary | ICD-10-CM | POA: Insufficient documentation

## 2018-12-29 DIAGNOSIS — Z952 Presence of prosthetic heart valve: Secondary | ICD-10-CM | POA: Insufficient documentation

## 2018-12-29 DIAGNOSIS — Z87891 Personal history of nicotine dependence: Secondary | ICD-10-CM | POA: Insufficient documentation

## 2018-12-29 DIAGNOSIS — M810 Age-related osteoporosis without current pathological fracture: Secondary | ICD-10-CM | POA: Insufficient documentation

## 2018-12-29 DIAGNOSIS — E7800 Pure hypercholesterolemia, unspecified: Secondary | ICD-10-CM | POA: Insufficient documentation

## 2018-12-29 HISTORY — DX: Age-related osteoporosis without current pathological fracture: M81.0

## 2018-12-29 HISTORY — PX: CATARACT EXTRACTION W/PHACO: SHX586

## 2018-12-29 LAB — GLUCOSE, CAPILLARY
Glucose-Capillary: 119 mg/dL — ABNORMAL HIGH (ref 70–99)
Glucose-Capillary: 119 mg/dL — ABNORMAL HIGH (ref 70–99)

## 2018-12-29 SURGERY — PHACOEMULSIFICATION, CATARACT, WITH IOL INSERTION
Anesthesia: Monitor Anesthesia Care | Site: Eye | Laterality: Left

## 2018-12-29 MED ORDER — BRIMONIDINE TARTRATE-TIMOLOL 0.2-0.5 % OP SOLN
OPHTHALMIC | Status: DC | PRN
  Administered 2018-12-29: 1 [drp] via OPHTHALMIC

## 2018-12-29 MED ORDER — FENTANYL CITRATE (PF) 100 MCG/2ML IJ SOLN
INTRAMUSCULAR | Status: DC | PRN
  Administered 2018-12-29: 50 ug via INTRAVENOUS

## 2018-12-29 MED ORDER — ONDANSETRON HCL 4 MG/2ML IJ SOLN: 4.0000 mg | Freq: Once | INTRAMUSCULAR | Status: DC | PRN

## 2018-12-29 MED ORDER — MIDAZOLAM HCL 2 MG/2ML IJ SOLN
INTRAMUSCULAR | Status: DC | PRN
  Administered 2018-12-29: 1 mg via INTRAVENOUS

## 2018-12-29 MED ORDER — MOXIFLOXACIN HCL 0.5 % OP SOLN
OPHTHALMIC | Status: DC | PRN
  Administered 2018-12-29: 0.2 mL via OPHTHALMIC

## 2018-12-29 MED ORDER — ARMC OPHTHALMIC DILATING DROPS
1.0000 "application " | OPHTHALMIC | Status: DC | PRN
  Administered 2018-12-29 (×3): 1 via OPHTHALMIC

## 2018-12-29 MED ORDER — LACTATED RINGERS IV SOLN: INTRAVENOUS | Status: DC

## 2018-12-29 MED ORDER — NA CHONDROIT SULF-NA HYALURON 40-17 MG/ML IO SOLN
INTRAOCULAR | Status: DC | PRN
  Administered 2018-12-29: 1 mL via INTRAOCULAR

## 2018-12-29 MED ORDER — LIDOCAINE HCL (PF) 2 % IJ SOLN
INTRAOCULAR | Status: DC | PRN
  Administered 2018-12-29: 11:00:00 1 mL

## 2018-12-29 MED ORDER — EPINEPHRINE PF 1 MG/ML IJ SOLN
INTRAOCULAR | Status: DC | PRN
  Administered 2018-12-29: 11:00:00 71 mL via OPHTHALMIC

## 2018-12-29 MED ORDER — TETRACAINE HCL 0.5 % OP SOLN
1.0000 [drp] | OPHTHALMIC | Status: DC | PRN
  Administered 2018-12-29 (×3): 1 [drp] via OPHTHALMIC

## 2018-12-29 SURGICAL SUPPLY — 21 items
CANNULA ANT/CHMB 27G (MISCELLANEOUS) ×1 IMPLANT
CANNULA ANT/CHMB 27GA (MISCELLANEOUS) ×3 IMPLANT
GLOVE SURG LX 8.0 MICRO (GLOVE) ×4
GLOVE SURG LX STRL 8.0 MICRO (GLOVE) ×1 IMPLANT
GLOVE SURG TRIUMPH 8.0 PF LTX (GLOVE) ×3 IMPLANT
GOWN STRL REUS W/ TWL LRG LVL3 (GOWN DISPOSABLE) ×2 IMPLANT
GOWN STRL REUS W/TWL LRG LVL3 (GOWN DISPOSABLE) ×6
LENS IOL TECNIS ITEC 18.0 (Intraocular Lens) ×2 IMPLANT
MARKER SKIN DUAL TIP RULER LAB (MISCELLANEOUS) ×3 IMPLANT
NDL FILTER BLUNT 18X1 1/2 (NEEDLE) ×1 IMPLANT
NDL RETROBULBAR .5 NSTRL (NEEDLE) ×3 IMPLANT
NEEDLE FILTER BLUNT 18X 1/2SAF (NEEDLE) ×2
NEEDLE FILTER BLUNT 18X1 1/2 (NEEDLE) ×1 IMPLANT
PACK EYE AFTER SURG (MISCELLANEOUS) ×3 IMPLANT
PACK OPTHALMIC (MISCELLANEOUS) ×3 IMPLANT
PACK PORFILIO (MISCELLANEOUS) ×3 IMPLANT
SUT ETHILON 10-0 CS-B-6CS-B-6 (SUTURE)
SUTURE EHLN 10-0 CS-B-6CS-B-6 (SUTURE) IMPLANT
SYR 3ML LL SCALE MARK (SYRINGE) ×3 IMPLANT
SYR TB 1ML LUER SLIP (SYRINGE) ×3 IMPLANT
WIPE NON LINTING 3.25X3.25 (MISCELLANEOUS) ×3 IMPLANT

## 2018-12-29 NOTE — Transfer of Care (Signed)
Immediate Anesthesia Transfer of Care Note  Patient: Jenny [name]  Procedure(s) Performed: CATARACT EXTRACTION PHACO AND INTRAOCULAR LENS PLACEMENT ([facility])  LEFT DIABETIC (Left Eye)  Patient Location: PACU  Anesthesia Type: MAC  Level of Consciousness: awake, alert  and patient cooperative  Airway and Oxygen Therapy: Patient Spontanous Breathing and Patient connected to supplemental oxygen  Post-op Assessment: Post-op Vital signs reviewed, Patient's Cardiovascular Status Stable, Respiratory Function Stable, Patent Airway and No signs of Nausea or vomiting  Post-op Vital Signs: Reviewed and stable  Complications: No apparent anesthesia complications

## 2018-12-29 NOTE — Op Note (Signed)
PREOPERATIVE DIAGNOSIS:  Nuclear sclerotic cataract of the left eye.   POSTOPERATIVE DIAGNOSIS:  Nuclear sclerotic cataract of the left eye.   OPERATIVE PROCEDURE: Procedure(s): CATARACT EXTRACTION PHACO AND INTRAOCULAR LENS PLACEMENT ([place])  LEFT DIABETIC   SURGEON:  Birder Robson, MD.   ANESTHESIA:  Anesthesiologist: Veda Canning, MD CRNA: Cameron Ali, CRNA  1.      Managed anesthesia care. 2.     0.23ml of Shugarcaine was instilled following the paracentesis   COMPLICATIONS:  None.   TECHNIQUE:   Stop and chop   DESCRIPTION OF PROCEDURE:  The patient was examined and consented in the preoperative holding area where the aforementioned topical anesthesia was applied to the left eye and then brought back to the Operating Room where the left eye was prepped and draped in the usual sterile ophthalmic fashion and a lid speculum was placed. A paracentesis was created with the side port blade and the anterior chamber was filled with viscoelastic. A near clear corneal incision was performed with the steel keratome. A continuous curvilinear capsulorrhexis was performed with a cystotome followed by the capsulorrhexis forceps. Hydrodissection and hydrodelineation were carried out with BSS on a blunt cannula. The lens was removed in a stop and chop  technique and the remaining cortical material was removed with the irrigation-aspiration handpiece. The capsular bag was inflated with viscoelastic and the Technis [id number] lens was placed in the capsular bag without complication. The remaining viscoelastic was removed from the eye with the irrigation-aspiration handpiece. The wounds were hydrated. The anterior chamber was flushed with BSS and the eye was inflated to physiologic pressure. 0.32ml Vigamox was placed in the anterior chamber. The wounds were found to be water tight. The eye was dressed with Combigan. The patient was given protective glasses to wear throughout the day and a shield with which to  sleep tonight. The patient was also given drops with which to begin a drop regimen today and will follow-up with me in one day. Implant Name Type Inv. Item Serial No. Manufacturer Lot No. LRB No. Used Action  LENS IOL DIOP 18.0 - [id number] Intraocular Lens LENS IOL DIOP 18.0 [id number] AMO  Left 1 Implanted    Procedure(s) with comments: CATARACT EXTRACTION PHACO AND INTRAOCULAR LENS PLACEMENT ([facility])  LEFT DIABETIC (Left) - Diabetic - insuli and oral meds  Electronically signed: Birder Robson 12/29/2018 10:58 AM

## 2018-12-29 NOTE — Anesthesia Procedure Notes (Signed)
Procedure Name: MAC Date/Time: 12/29/2018 10:40 AM Performed by: Cameron Ali, CRNA Pre-anesthesia Checklist: Patient identified, Emergency Drugs available, Suction available, Timeout performed and Patient being monitored Patient Re-evaluated:Patient Re-evaluated prior to induction Oxygen Delivery Method: Nasal cannula Placement Confirmation: positive ETCO2

## 2018-12-29 NOTE — H&P (Signed)
All labs reviewed. Abnormal studies sent to patients PCP when indicated.  Previous H&P reviewed, patient examined, there are NO CHANGES.  Jenny [name] Porfilio6/30/202010:29 AM

## 2018-12-29 NOTE — Anesthesia Preprocedure Evaluation (Signed)
Anesthesia Evaluation  Patient identified by MRN, date of birth, ID band Patient awake    Reviewed: Allergy & Precautions, NPO status , Patient's Chart, lab work & pertinent test results  Airway Mallampati: II  TM Distance: >3 FB     Dental   Pulmonary former smoker,    breath sounds clear to auscultation       Cardiovascular + CAD and +CHF  + Valvular Problems/Murmurs (s/p aortic valve replacement 2018)  Rhythm:Regular Rate:Normal  TTE 04/08/17: EF 50-55%, mild MR   Neuro/Psych Anxiety Depression TIACVA    GI/Hepatic   Endo/Other  diabetesHypothyroidism   Renal/GU      Musculoskeletal  (+) Arthritis ,   Abdominal   Peds  Hematology   Anesthesia Other Findings   Reproductive/Obstetrics                             Anesthesia Physical Anesthesia Plan  ASA: III  Anesthesia Plan: MAC   Post-op Pain Management:    Induction: Intravenous  PONV Risk Score and Plan:   Airway Management Planned: Nasal Cannula  Additional Equipment:   Intra-op Plan:   Post-operative Plan:   Informed Consent: I have reviewed the patients History and Physical, chart, labs and discussed the procedure including the risks, benefits and alternatives for the proposed anesthesia with the patient or authorized representative who has indicated his/her understanding and acceptance.       Plan Discussed with: CRNA  Anesthesia Plan Comments:         Anesthesia Quick Evaluation

## 2018-12-29 NOTE — Anesthesia Postprocedure Evaluation (Signed)
Anesthesia Post Note  Patient: Jenny [name]  Procedure(s) Performed: CATARACT EXTRACTION PHACO AND INTRAOCULAR LENS PLACEMENT (IOC)  LEFT DIABETIC (Left Eye)  Patient location during evaluation: PACU Anesthesia Type: MAC Level of consciousness: awake and alert Pain management: pain level controlled Vital Signs Assessment: post-procedure vital signs reviewed and stable Respiratory status: spontaneous breathing, nonlabored ventilation, respiratory function stable and patient connected to nasal cannula oxygen Cardiovascular status: stable and blood pressure returned to baseline Postop Assessment: no apparent nausea or vomiting Anesthetic complications: no    Veda Canning

## 2019-01-14 DIAGNOSIS — K219 Gastro-esophageal reflux disease without esophagitis: Secondary | ICD-10-CM | POA: Diagnosis not present

## 2019-01-14 DIAGNOSIS — I10 Essential (primary) hypertension: Secondary | ICD-10-CM | POA: Diagnosis not present

## 2019-01-14 DIAGNOSIS — M354 Diffuse (eosinophilic) fasciitis: Secondary | ICD-10-CM | POA: Diagnosis not present

## 2019-01-14 DIAGNOSIS — E119 Type 2 diabetes mellitus without complications: Secondary | ICD-10-CM | POA: Diagnosis not present

## 2019-02-18 DIAGNOSIS — E119 Type 2 diabetes mellitus without complications: Secondary | ICD-10-CM | POA: Diagnosis not present

## 2019-02-18 DIAGNOSIS — F411 Generalized anxiety disorder: Secondary | ICD-10-CM | POA: Diagnosis not present

## 2019-02-18 DIAGNOSIS — M818 Other osteoporosis without current pathological fracture: Secondary | ICD-10-CM | POA: Diagnosis not present

## 2019-02-18 DIAGNOSIS — I119 Hypertensive heart disease without heart failure: Secondary | ICD-10-CM | POA: Diagnosis not present

## 2019-02-18 DIAGNOSIS — Z23 Encounter for immunization: Secondary | ICD-10-CM | POA: Diagnosis not present

## 2019-03-30 DIAGNOSIS — F411 Generalized anxiety disorder: Secondary | ICD-10-CM | POA: Diagnosis not present

## 2019-03-30 DIAGNOSIS — I119 Hypertensive heart disease without heart failure: Secondary | ICD-10-CM | POA: Diagnosis not present

## 2019-03-30 DIAGNOSIS — E119 Type 2 diabetes mellitus without complications: Secondary | ICD-10-CM | POA: Diagnosis not present

## 2019-03-30 DIAGNOSIS — M818 Other osteoporosis without current pathological fracture: Secondary | ICD-10-CM | POA: Diagnosis not present

## 2019-04-20 DIAGNOSIS — I119 Hypertensive heart disease without heart failure: Secondary | ICD-10-CM | POA: Diagnosis not present

## 2019-04-20 DIAGNOSIS — M818 Other osteoporosis without current pathological fracture: Secondary | ICD-10-CM | POA: Diagnosis not present

## 2019-04-20 DIAGNOSIS — E119 Type 2 diabetes mellitus without complications: Secondary | ICD-10-CM | POA: Diagnosis not present

## 2019-04-20 DIAGNOSIS — F411 Generalized anxiety disorder: Secondary | ICD-10-CM | POA: Diagnosis not present

## 2019-05-10 ENCOUNTER — Other Ambulatory Visit: Payer: Self-pay | Admitting: Acute Care

## 2019-05-10 DIAGNOSIS — R4781 Slurred speech: Secondary | ICD-10-CM

## 2019-05-10 DIAGNOSIS — R29810 Facial weakness: Secondary | ICD-10-CM | POA: Diagnosis not present

## 2019-05-10 DIAGNOSIS — I639 Cerebral infarction, unspecified: Secondary | ICD-10-CM

## 2019-05-10 DIAGNOSIS — R262 Difficulty in walking, not elsewhere classified: Secondary | ICD-10-CM | POA: Diagnosis not present

## 2019-05-10 DIAGNOSIS — R413 Other amnesia: Secondary | ICD-10-CM | POA: Diagnosis not present

## 2019-05-19 ENCOUNTER — Ambulatory Visit
Admission: RE | Admit: 2019-05-19 | Discharge: 2019-05-19 | Disposition: A | Discharge status: Home / Self Care | Payer: Medicare Other | Source: Ambulatory Visit | Attending: Acute Care | Admitting: Acute Care

## 2019-05-19 ENCOUNTER — Other Ambulatory Visit: Payer: Self-pay

## 2019-05-19 DIAGNOSIS — I639 Cerebral infarction, unspecified: Secondary | ICD-10-CM | POA: Diagnosis not present

## 2019-05-19 DIAGNOSIS — R4781 Slurred speech: Secondary | ICD-10-CM | POA: Diagnosis not present

## 2019-05-19 DIAGNOSIS — R42 Dizziness and giddiness: Secondary | ICD-10-CM | POA: Diagnosis not present

## 2019-05-19 DIAGNOSIS — R29810 Facial weakness: Secondary | ICD-10-CM | POA: Diagnosis not present

## 2019-06-02 DIAGNOSIS — R410 Disorientation, unspecified: Secondary | ICD-10-CM | POA: Diagnosis not present

## 2019-06-02 DIAGNOSIS — R300 Dysuria: Secondary | ICD-10-CM | POA: Diagnosis not present

## 2019-06-06 ENCOUNTER — Emergency Department: Payer: Medicare Other

## 2019-06-06 ENCOUNTER — Emergency Department
Admission: EM | Admit: 2019-06-06 | Discharge: 2019-06-06 | Disposition: A | Discharge status: Home / Self Care | Payer: Medicare Other | Attending: Emergency Medicine | Admitting: Emergency Medicine

## 2019-06-06 ENCOUNTER — Other Ambulatory Visit: Payer: Self-pay

## 2019-06-06 DIAGNOSIS — R404 Transient alteration of awareness: Secondary | ICD-10-CM | POA: Diagnosis not present

## 2019-06-06 DIAGNOSIS — I509 Heart failure, unspecified: Secondary | ICD-10-CM | POA: Insufficient documentation

## 2019-06-06 DIAGNOSIS — E785 Hyperlipidemia, unspecified: Secondary | ICD-10-CM | POA: Diagnosis not present

## 2019-06-06 DIAGNOSIS — R29810 Facial weakness: Secondary | ICD-10-CM | POA: Diagnosis not present

## 2019-06-06 DIAGNOSIS — E119 Type 2 diabetes mellitus without complications: Secondary | ICD-10-CM | POA: Diagnosis not present

## 2019-06-06 DIAGNOSIS — E161 Other hypoglycemia: Secondary | ICD-10-CM | POA: Diagnosis not present

## 2019-06-06 DIAGNOSIS — E162 Hypoglycemia, unspecified: Secondary | ICD-10-CM | POA: Diagnosis not present

## 2019-06-06 DIAGNOSIS — R410 Disorientation, unspecified: Secondary | ICD-10-CM | POA: Insufficient documentation

## 2019-06-06 DIAGNOSIS — I10 Essential (primary) hypertension: Secondary | ICD-10-CM | POA: Diagnosis not present

## 2019-06-06 DIAGNOSIS — R531 Weakness: Secondary | ICD-10-CM | POA: Diagnosis not present

## 2019-06-06 DIAGNOSIS — I2510 Atherosclerotic heart disease of native coronary artery without angina pectoris: Secondary | ICD-10-CM | POA: Diagnosis not present

## 2019-06-06 LAB — URINALYSIS, COMPLETE (UACMP) WITH MICROSCOPIC
Bacteria, UA: NONE SEEN
Bilirubin Urine: NEGATIVE
Glucose, UA: NEGATIVE mg/dL
Hgb urine dipstick: NEGATIVE
Ketones, ur: NEGATIVE mg/dL
Leukocytes,Ua: NEGATIVE
Nitrite: NEGATIVE
Protein, ur: NEGATIVE mg/dL
Specific Gravity, Urine: 1.006 (ref 1.005–1.030)
pH: 6 (ref 5.0–8.0)

## 2019-06-06 LAB — CBC WITH DIFFERENTIAL/PLATELET
Abs Immature Granulocytes: 0.02 10*3/uL (ref 0.00–0.07)
Basophils Absolute: 0 10*3/uL (ref 0.0–0.1)
Basophils Relative: 1 %
Eosinophils Absolute: 0.1 10*3/uL (ref 0.0–0.5)
Eosinophils Relative: 2 %
HCT: 42.4 % (ref 36.0–46.0)
Hemoglobin: 14.1 g/dL (ref 12.0–15.0)
Immature Granulocytes: 0 %
Lymphocytes Relative: 20 %
Lymphs Abs: 1.1 10*3/uL (ref 0.7–4.0)
MCH: 27.8 pg (ref 26.0–34.0)
MCHC: 33.3 g/dL (ref 30.0–36.0)
MCV: 83.5 fL (ref 80.0–100.0)
Monocytes Absolute: 0.4 10*3/uL (ref 0.1–1.0)
Monocytes Relative: 8 %
Neutro Abs: 3.6 10*3/uL (ref 1.7–7.7)
Neutrophils Relative %: 69 %
Platelets: 116 10*3/uL — ABNORMAL LOW (ref 150–400)
RBC: 5.08 MIL/uL (ref 3.87–5.11)
RDW: 15.8 % — ABNORMAL HIGH (ref 11.5–15.5)
WBC: 5.2 10*3/uL (ref 4.0–10.5)
nRBC: 0 % (ref 0.0–0.2)

## 2019-06-06 LAB — BASIC METABOLIC PANEL
Anion gap: 8 (ref 5–15)
BUN: 22 mg/dL (ref 8–23)
CO2: 24 mmol/L (ref 22–32)
Calcium: 8.8 mg/dL — ABNORMAL LOW (ref 8.9–10.3)
Chloride: 101 mmol/L (ref 98–111)
Creatinine, Ser: 1.01 mg/dL — ABNORMAL HIGH (ref 0.44–1.00)
GFR calc Af Amer: >60 mL/min (ref >60)
GFR calc non Af Amer: 56 mL/min — ABNORMAL LOW (ref >60)
Glucose, Bld: 164 mg/dL — ABNORMAL HIGH (ref 70–99)
Potassium: 4.2 mmol/L (ref 3.5–5.1)
Sodium: 133 mmol/L — ABNORMAL LOW (ref 135–145)

## 2019-06-06 LAB — TROPONIN I (HIGH SENSITIVITY): Troponin I (High Sensitivity): 17 ng/L (ref <18)

## 2019-06-06 MED ORDER — LACTATED RINGERS IV BOLUS
1000.0000 mL | Freq: Once | INTRAVENOUS | Status: AC
  Administered 2019-06-06: 13:00:00 1000 mL via INTRAVENOUS

## 2019-06-06 NOTE — ED Triage Notes (Signed)
Pt arrived via EMS from home for report of weakness x2 weeks - denies pain - denies any other illness sxs

## 2019-06-06 NOTE — ED Notes (Signed)
Pt assisted to toilet to void - urine sample obtained

## 2019-06-06 NOTE — ED Notes (Signed)
Pt aware that urine sample is needed and to ring call bell when she needs to void

## 2019-06-06 NOTE — ED Provider Notes (Signed)
[facility] Emergency Department Provider Note   ____________________________________________   First MD Initiated Contact with Patient 06/06/19 1302     (approximate)  I have reviewed the triage vital signs and the nursing notes.   HISTORY  Chief Complaint Weakness    HPI Jenny [name] is a 73 y.o. female with past medical history of CAD, CHF, diabetes, aortic stenosis status post valve replacement presents to the ED complaining of weakness.  Patient reports that she has been feeling very generally weak over about the past 2 weeks.  EMS reports that she had been confused at times but they did not note any focal numbness or weakness.  Patient denies any vision changes or speech changes.  She states she has otherwise been feeling well with no fevers, cough, chest pain, shortness of breath, dysuria, or hematuria.  She states "they are sending me to some other doctors" for further evaluation of these recent issues, but she is not sure what testing they are doing.        Past Medical History:  Diagnosis Date  . Anxiety   . Arthritis   . CAD (coronary artery disease)    a. 01/2017 Cath: nonobs dzs.  . Chronic systolic CHF (congestive heart failure) (HCC)    a. TTE 12/2016, EF 45-50%, probable hypokinesis of the mid apical anteroseptal, anterior, & apical myocardium, GR2DD, possibly bicuspid aortic valve that was moderately thickened w/ severely calcified leaflets, severe aortic stenosis w/ mean gradient 47 mmHg, valve area 0.52 cm, mild MR, mildly dilated LA  . Depression   . Diabetes mellitus with complication (HCC)    Tylenol  . Hyperlipidemia   . Hypothyroidism   . IBS (irritable bowel syndrome)   . Iron deficiency anemia    a. s/p pRBC x1 in 12/2016  . Microcytic anemia   . Osteoporosis   . Panic attacks   . Severe aortic stenosis    a. TTE 12/2016: possibly bicuspid aortic valve that was moderately thickened with severely calcified leaflets. There  was severe aortic stenosis with a mean gradient of 47 mmHg and valve area of 0.52 cm; b. 01/2017 s/p AVR w/ 19 mm bioprosthetic Magna Ease pericardial tissue valve, ser# [id number].  . Stroke (HCC) 2001  . TIA (transient ischemic attack) 2006    Patient Active Problem List   Diagnosis Date Noted  . Tibia/fibula fracture, left, closed, initial encounter 05/19/2018  . S/P AVR (aortic valve replacement) 02/17/2017  . Diabetes (HCC) 01/29/2017  . Depression 01/29/2017  . Severe aortic stenosis 01/22/2017  . Chronic systolic CHF (congestive heart failure) (HCC) 01/22/2017  . Anemia 01/16/2017  . Acute CHF (congestive heart failure) (HCC) 01/16/2017  . Elevated troponin 01/16/2017  . Hypokalemia 01/16/2017  . Chest pain 01/15/2017    Past Surgical History:  Procedure Laterality Date  . ABDOMINAL HYSTERECTOMY    . AORTIC VALVE REPLACEMENT N/A 02/17/2017   Procedure: AORTIC VALVE REPLACEMENT (AVR);  Surgeon: [name], [name], MD;  [facility];  Service: Open Heart Surgery;  Laterality: N/A;  . CATARACT EXTRACTION W/PHACO Right 04/09/2016   Procedure: CATARACT EXTRACTION PHACO AND INTRAOCULAR LENS PLACEMENT (IOC);  Surgeon: [name], MD;  Location: [facility] ORS;  Service: Ophthalmology;  Laterality: Right;  US 57.2 AP% 18.6 CDE 10.64 Fluid Pack lot # [id number]  . CATARACT EXTRACTION W/PHACO Left 12/29/2018   Procedure: CATARACT EXTRACTION PHACO AND INTRAOCULAR LENS PLACEMENT (IOC)  LEFT DIABETIC;  Surgeon: Galen ManilaPorfilio, William, MD;  Location: [facility];  Service: Ophthalmology;  Laterality: Left;  Diabetic - insuli and oral meds  . CHOLECYSTECTOMY    . COLONOSCOPY    . CORONARY ANGIOGRAPHY N/A 02/03/2017   Procedure: CORONARY ANGIOGRAPHY;  Surgeon: Iran Ouch, MD;  Location: [facility] INVASIVE CV LAB;  Service: Cardiovascular;  Laterality: N/A;  . FRACTURE SURGERY     LEFT ANKLE  . RIGHT AND LEFT HEART CATH Bilateral 02/03/2017   Procedure: Right and Left Heart Cath;  Surgeon:  Iran Ouch, MD;  Location: [facility] INVASIVE CV LAB;  Service: Cardiovascular;  Laterality: Bilateral;  . TEE WITHOUT CARDIOVERSION N/A 02/17/2017   Procedure: TRANSESOPHAGEAL ECHOCARDIOGRAM (TEE);  Surgeon: Donata Clay, Theron Arista, MD;  Location: [facility] OR;  Service: Open Heart Surgery;  Laterality: N/A;  . TIBIA IM NAIL INSERTION Left 05/19/2018   Procedure: INTRAMEDULLARY (IM) NAIL TIBIAL;  Surgeon: Juanell Fairly, MD;  Location: [facility] ORS;  Service: Orthopedics;  Laterality: Left;    Prior to Admission medications   Medication Sig Start Date End Date Taking? Authorizing Provider  alendronate (FOSAMAX) 70 MG tablet Take 70 mg by mouth once a week. Take with a full glass of water on an empty stomach.    [provider]  ALPRAZolam Prudy Feeler) 0.5 MG tablet Take 0.5 mg by mouth 2 (two) times daily.     [provider]  aspirin EC 81 MG tablet Take 81 mg by mouth daily.    [provider]  Cholecalciferol (VITAMIN D3 PO) Take by mouth daily.    [provider]  hydrOXYzine (ATARAX/VISTARIL) 25 MG tablet Take 25 mg by mouth 2 (two) times daily.     [provider]  insulin glargine (LANTUS) 100 UNIT/ML injection Inject 25-35 Units into the skin See admin instructions. 35 units every morning and 25 units at bedtime    [provider]  levothyroxine (SYNTHROID, LEVOTHROID) 88 MCG tablet Take 88 mcg by mouth daily.    [provider]  Loperamide HCl (IMODIUM A-D PO) Take by mouth as needed.    [provider]  Melatonin 10 MG TABS Take by mouth at bedtime.    [provider]  metFORMIN (GLUCOPHAGE) 500 MG tablet Take 500 mg by mouth 2 (two) times daily.    [provider]  Phenylephrine-DM-GG (MUCINEX FAST-MAX CONGEST COUGH PO) Take by mouth as needed.    [provider]  rosuvastatin (CRESTOR) 10 MG tablet Take 1 tablet (10 mg total) by mouth daily. 03/12/17 12/22/18  Creig Hines, NP  sertraline  (ZOLOFT) 100 MG tablet Take 100 mg by mouth 2 (two) times daily.    [provider]  sitaGLIPtin (JANUVIA) 100 MG tablet Take 100 mg by mouth daily.    [provider]    Allergies Iodine and Shellfish allergy  Family History  Problem Relation Age of Onset  . Hypertension Mother     Social History Social History   Tobacco Use  . Smoking status: Former Smoker    Years: 30.00    Quit date: 02/04/2000    Years since quitting: 19.3  . Smokeless tobacco: Never Used  Substance Use Topics  . Alcohol use: No  . Drug use: No    Review of Systems  Constitutional: No fever/chills Eyes: No visual changes. ENT: No sore throat. Cardiovascular: Denies chest pain. Respiratory: Denies shortness of breath. Gastrointestinal: No abdominal pain.  No nausea, no vomiting.  No diarrhea.  No constipation. Genitourinary: Negative for dysuria. Musculoskeletal: Negative for back pain. Skin: Negative for rash.  Neurological: Negative for headaches, focal weakness or numbness.  Positive for generalized weakness.  ____________________________________________   PHYSICAL EXAM:  VITAL SIGNS: ED Triage Vitals [06/06/19 1300]  Enc Vitals Group     BP      Pulse      Resp      Temp      Temp src      SpO2      Weight 160 lb (72.6 kg)     Height 5\' 5"  (1.651 m)     Head Circumference      Peak Flow      Pain Score 0     Pain Loc      Pain Edu?      Excl. in GC?     Constitutional: Alert and oriented. Eyes: Conjunctivae are normal.  Pupils equal round and reactive to light bilaterally, extraocular movements intact without nystagmus. Head: Atraumatic. Nose: No congestion/rhinnorhea. Mouth/Throat: Mucous membranes are moist. Neck: Normal ROM Cardiovascular: Normal rate, regular rhythm. Grossly normal heart sounds. Respiratory: Normal respiratory effort.  No retractions. Lungs CTAB. Gastrointestinal: Soft and nontender. No distention. Genitourinary: deferred  Musculoskeletal: No lower extremity tenderness nor edema. Neurologic:  Normal speech and language. No gross focal neurologic deficits are appreciated.  5-5 strength in bilateral upper and lower extremities, no plantar drift. Skin:  Skin is warm, dry and intact. No rash noted. Psychiatric: Mood and affect are normal. Speech and behavior are normal.  ____________________________________________   LABS (all labs ordered are listed, but only abnormal results are displayed)  Labs Reviewed  CBC WITH DIFFERENTIAL/PLATELET - Abnormal; Notable for the following components:      Result Value   RDW 15.8 (*)    Platelets 116 (*)    All other components within normal limits  BASIC METABOLIC PANEL - Abnormal; Notable for the following components:   Sodium 133 (*)    Glucose, Bld 164 (*)    Creatinine, Ser 1.01 (*)    Calcium 8.8 (*)    GFR calc non Af Amer 56 (*)    All other components within normal limits  URINALYSIS, COMPLETE (UACMP) WITH MICROSCOPIC - Abnormal; Notable for the following components:   Color, Urine STRAW (*)    APPearance CLEAR (*)    All other components within normal limits  TROPONIN I (HIGH SENSITIVITY)  TROPONIN I (HIGH SENSITIVITY)   ____________________________________________  EKG  ED ECG REPORT I, Chesley Noonharles Genice Kimberlin, the attending physician, personally viewed and interpreted this ECG.   Date: 06/06/2019  EKG Time: 13:07  Rate: 82  Rhythm: normal sinus rhythm  Axis: Normal  Intervals:right bundle branch block  ST&T Change: None, RBBB similar to prior   PROCEDURES  Procedure(s) performed (including Critical Care):  Procedures   ____________________________________________   INITIAL IMPRESSION / ASSESSMENT AND PLAN / ED COURSE       73 year old female presents to the ED for 2 weeks of reported generalized weakness.  She has no focal neurologic findings on exam, doubt acute stroke and no indication for imaging of her head as she has not had any  reported trauma recently.  Will assess for infectious etiology with chest x-ray and UA.  EKG shows right bundle branch block with no acute ischemic changes or evidence of arrhythmia.  We will additionally screen labs and hydrate with IV fluids.  Reviewing her chart, it appears she has recent spells of confusion and speech difficulties, had MRI 2 weeks prior that was negative for stroke or other acute process.  She  has been referred to neurology with suspicion for dementia.  If work-up remains unremarkable here in the ED, she would be appropriate for PCP as well as neurology follow-up.  Remainder of labs unremarkable, no evidence of UTI.  Discussed plan for PCP as well as neurology follow-up with patient's husband, who agrees.  Counseled patient and husband to return to the ED for new or worsening symptoms.      ____________________________________________   FINAL CLINICAL IMPRESSION(S) / ED DIAGNOSES  Final diagnoses:  Generalized weakness  Confusion     ED Discharge Orders    None       Note:  This document was prepared using Dragon voice recognition software and may include unintentional dictation errors.   Blake Divine, MD 06/06/19 [phone]

## 2019-06-10 DIAGNOSIS — R413 Other amnesia: Secondary | ICD-10-CM | POA: Diagnosis not present

## 2019-06-10 DIAGNOSIS — R262 Difficulty in walking, not elsewhere classified: Secondary | ICD-10-CM | POA: Diagnosis not present

## 2019-06-10 DIAGNOSIS — I639 Cerebral infarction, unspecified: Secondary | ICD-10-CM | POA: Diagnosis not present

## 2019-06-10 DIAGNOSIS — R4781 Slurred speech: Secondary | ICD-10-CM | POA: Diagnosis not present

## 2019-06-10 DIAGNOSIS — R300 Dysuria: Secondary | ICD-10-CM | POA: Diagnosis not present

## 2019-06-10 DIAGNOSIS — R410 Disorientation, unspecified: Secondary | ICD-10-CM | POA: Diagnosis not present

## 2019-06-15 DIAGNOSIS — E119 Type 2 diabetes mellitus without complications: Secondary | ICD-10-CM | POA: Diagnosis not present

## 2019-06-15 DIAGNOSIS — M818 Other osteoporosis without current pathological fracture: Secondary | ICD-10-CM | POA: Diagnosis not present

## 2019-06-15 DIAGNOSIS — F411 Generalized anxiety disorder: Secondary | ICD-10-CM | POA: Diagnosis not present

## 2019-06-15 DIAGNOSIS — I119 Hypertensive heart disease without heart failure: Secondary | ICD-10-CM | POA: Diagnosis not present

## 2019-07-05 ENCOUNTER — Ambulatory Visit: Payer: Medicare Other | Attending: Neurology

## 2019-07-05 ENCOUNTER — Other Ambulatory Visit: Payer: Self-pay

## 2019-07-05 DIAGNOSIS — R2681 Unsteadiness on feet: Secondary | ICD-10-CM | POA: Insufficient documentation

## 2019-07-05 DIAGNOSIS — R2689 Other abnormalities of gait and mobility: Secondary | ICD-10-CM

## 2019-07-05 DIAGNOSIS — M6281 Muscle weakness (generalized): Secondary | ICD-10-CM | POA: Diagnosis not present

## 2019-07-05 NOTE — Therapy (Signed)
[facility] MAIN [facility] SERVICES [address] [place], [place], [address] Phone: [phone]   Fax:  [phone]  Physical Therapy Evaluation  Patient Details  Name: Jenny [name] MRN: [id number] Date of Birth: 1946-06-04 Referring Provider (PT): Theora Master   Encounter Date: 07/05/2019  PT End of Session - 07/05/19 1559    Visit Number  1    Number of Visits  16    Date for PT Re-Evaluation  08/30/19    Authorization Type  1/10 eval 07/05/19    PT Start Time  1434    PT Stop Time  1527    PT Time Calculation (min)  53 min    Equipment Utilized During Treatment  Gait belt    Activity Tolerance  Patient tolerated treatment well    Behavior During Therapy  [facility] for tasks assessed/performed;Anxious       Past Medical History:  Diagnosis Date  . Anxiety   . Arthritis   . CAD (coronary artery disease)    a. 01/2017 Cath: nonobs dzs.  . Chronic systolic CHF (congestive heart failure) (HCC)    a. TTE 12/2016, EF 45-50%, probable hypokinesis of the mid apical anteroseptal, anterior, & apical myocardium, GR2DD, possibly bicuspid aortic valve that was moderately thickened w/ severely calcified leaflets, severe aortic stenosis w/ mean gradient 47 mmHg, valve area 0.52 cm, mild MR, mildly dilated LA  . Depression   . Diabetes mellitus with complication (HCC)    Tylenol  . Hyperlipidemia   . Hypothyroidism   . IBS (irritable bowel syndrome)   . Iron deficiency anemia    a. s/p pRBC x1 in 12/2016  . Microcytic anemia   . Osteoporosis   . Panic attacks   . Severe aortic stenosis    a. TTE 12/2016: possibly bicuspid aortic valve that was moderately thickened with severely calcified leaflets. There was severe aortic stenosis with a mean gradient of 47 mmHg and valve area of 0.52 cm; b. 01/2017 s/p AVR w/ 19 mm bioprosthetic Magna Ease pericardial tissue valve, ser# [id number].  . Stroke (HCC) 2001  . TIA (transient ischemic attack) 2006    Past  Surgical History:  Procedure Laterality Date  . ABDOMINAL HYSTERECTOMY    . AORTIC VALVE REPLACEMENT N/A 02/17/2017   Procedure: AORTIC VALVE REPLACEMENT (AVR);  Surgeon: Donata Clay, Theron Arista, MD;  Location: [facility] OR;  Service: Open Heart Surgery;  Laterality: N/A;  . CATARACT EXTRACTION W/PHACO Right 04/09/2016   Procedure: CATARACT EXTRACTION PHACO AND INTRAOCULAR LENS PLACEMENT (IOC);  Surgeon: Galen Manila, MD;  Location: [facility] ORS;  Service: Ophthalmology;  Laterality: Right;  [place] 57.2 AP% 18.6 CDE 10.64 Fluid Pack lot # [id number]  . CATARACT EXTRACTION W/PHACO Left 12/29/2018   Procedure: CATARACT EXTRACTION PHACO AND INTRAOCULAR LENS PLACEMENT (IOC)  LEFT DIABETIC;  Surgeon: Galen Manila, MD;  Location: [facility];  Service: Ophthalmology;  Laterality: Left;  Diabetic - insuli and oral meds  . CHOLECYSTECTOMY    . COLONOSCOPY    . CORONARY ANGIOGRAPHY N/A 02/03/2017   Procedure: CORONARY ANGIOGRAPHY;  Surgeon: Iran Ouch, MD;  Location: [facility] INVASIVE CV LAB;  Service: Cardiovascular;  Laterality: N/A;  . FRACTURE SURGERY     LEFT ANKLE  . RIGHT AND LEFT HEART CATH Bilateral 02/03/2017   Procedure: Right and Left Heart Cath;  Surgeon: Iran Ouch, MD;  Location: [facility] INVASIVE CV LAB;  Service: Cardiovascular;  Laterality: Bilateral;  . TEE WITHOUT CARDIOVERSION N/A 02/17/2017   Procedure: TRANSESOPHAGEAL  ECHOCARDIOGRAM (TEE);  Surgeon: Donata Clay, Theron Arista, MD;  Location: [facility] OR;  Service: Open Heart Surgery;  Laterality: N/A;  . TIBIA IM NAIL INSERTION Left 05/19/2018   Procedure: INTRAMEDULLARY (IM) NAIL TIBIAL;  Surgeon: Juanell Fairly, MD;  Location: [facility] ORS;  Service: Orthopedics;  Laterality: Left;    There were no vitals filed for this visit.   Subjective Assessment - 07/05/19 1446    Subjective  Patient is a 74 year old female who presents with new difficulty walking and weakness in legs    Patient is accompained by:  Family member    Pertinent History   Patient is a 74 year old female who presents with new difficulty walking and weakness in legs. Patient and husband report it being about a year. Has started having difficulty after fractures in LE in 2018. Per physician there is a decline in memory as seen in documented previous notes. Patient has had history of traumatic experiences in life (including watching son commit suicide in 41) and does have panic attacks. PMH of stroke, multiple fx's of lower legs, arthritis, asthma, a fib, CHF, depression, panic attacks, DM, GERD, HLD, HTN, neuropathy, osteoporosis, thyroid disease. Patient furniture surfaces at home, walker stays out all the time in case needed. Several small falls in past 6 months.    Limitations  Lifting;Standing;Walking;House hold activities    How long can you sit comfortably?  n/a    How long can you stand comfortably?  very few minutes    How long can you walk comfortably?  can walk across room    Patient Stated Goals  to get out of using a chair    Currently in Pain?  No/denies               PAIN: Bruise on R rib from christmas when fell on top step.   Worst pain 7/10 Best pain 2/10    POSTURE: Seated: forward head rounded shoulder positioning.  Standing: weight shift to left LE, decreased weight acceptance onto RLE   STRENGTH:  Graded on a 0-5 scale Muscle Group Left Right  Hip Flex 4/5 4-/5  Hip Abd 4-/5 3+/5  Hip Add 4-/5 3/5  Hip Ext    Hip IR/ER    Knee Flex 4-/5 3+/5  Knee Ext 4-/5 3+/5  Ankle DF 4-/5 4-/5  Ankle PF 4-/5 4-/5   SENSATION: Light touch: WFL bilaterally     FUNCTIONAL MOBILITY: STS: Able to perform without UE support with posterior lean, use of LLE>RLE; preference for using hands.   When attempting to lift RLE to tap box, posterior LOB noted requiring mod A for maintaining COM.    BALANCE: Dynamic Sitting Balance  Normal Able to sit unsupported and weight shift across midline maximally   Good Able to sit unsupported and  weight shift across midline moderately   Good-/Fair+ Able to sit unsupported and weight shift across midline minimally x  Fair Minimal weight shifting ipsilateral/front, difficulty crossing midline   Fair- Reach to ipsilateral side and unable to weight shift   Poor + Able to sit unsupported with min A and reach to ipsilateral side, unable to weight shift   Poor Able to sit unsupported with mod A and reach ipsilateral/front-can't cross midline     Standing Dynamic Balance  Normal Stand independently unsupported, able to weight shift and cross midline maximally   Good Stand independently unsupported, able to weight shift and cross midline moderately   Good-/Fair+ Stand independently unsupported, able to weight  shift across midline minimally   Fair Stand independently unsupported, weight shift, and reach ipsilaterally, loss of balance when crossing midline x  Poor+ Able to stand with Min A and reach ipsilaterally, unable to weight shift   Poor Able to stand with Mod A and minimally reach ipsilaterally, unable to cross midline.     Static Sitting Balance  Normal Able to maintain balance against maximal resistance   Good Able to maintain balance against moderate resistance   Good-/Fair+ Accepts minimal resistance   Fair Able to sit unsupported without balance loss and without UE support x  Poor+ Able to maintain with Minimal assistance from individual or chair   Poor Unable to maintain balance-requires mod/max support from individual or chair     Static Standing Balance  Normal Able to maintain standing balance against maximal resistance   Good Able to maintain standing balance against moderate resistance   Good-/Fair+ Able to maintain standing balance against minimal resistance   Fair Able to stand unsupported without UE support and without LOB for 1-2 min x  Fair- Requires Min A and UE support to maintain standing without loss of balance   Poor+ Requires mod A and UE support to maintain  standing without loss of balance   Poor Requires max A and UE support to maintain standing balance without loss      GAIT: Patient ambulates with use of RW, short shuffle steps, poor safety awareness, lets go with one UE midgait pattern and requires CGA   OUTCOME MEASURES: TEST Outcome Interpretation      10 meter walk test         0.625   M/s with RW  <1.0 m/s indicates increased risk for falls; limited community ambulator  ABC 26.9%   FOTO 40/100 Predicted raise to 53%  Berg Balance Assessment 32/56 <36/56 (100% risk for falls), 37-45 (80% risk for falls); 46-51 (>50% risk for falls); 52-55 (lower risk <25% of falls)            Objective measurements completed on examination: See above findings.              PT Education - 07/05/19 1539    Education Details  goals, POC,    Person(s) Educated  Patient;Spouse    Methods  Explanation    Comprehension  Verbalized understanding       PT Short Term Goals - 07/05/19 1613      PT SHORT TERM GOAL #1   Title  Patient will be independent in home exercise program to improve strength/mobility for better functional independence with ADLs.    Baseline  1/4: HEP to be given next session    Time  2    Period  Weeks    Status  New    Target Date  07/19/19        PT Long Term Goals - 07/05/19 1724      PT LONG TERM GOAL #1   Title  Patient will increase their FOTO score by 13 points to increase to 53% to demonstrate increased function and mobility for higher quality of life.    Baseline  1/4: 40/100    Time  8    Period  Weeks    Status  New    Target Date  08/30/19      PT LONG TERM GOAL #2   Title  Patient will increase BLE gross strength to 4+/5 as to improve functional strength for independent gait, increased standing tolerance and increased  ADL ability.    Baseline  1/4; R grossly 3+/5 L 4-/5    Time  8    Period  Weeks    Status  New    Target Date  08/30/19      PT LONG TERM GOAL #3   Title  Patient  will increase 10 meter walk test to >1.16m/s as to improve gait speed for better community ambulation and to reduce fall risk.    Baseline  1/4: 0.625 m/s with RW    Time  8    Period  Weeks    Status  New    Target Date  08/30/19      PT LONG TERM GOAL #4   Title  Patient will increase Berg Balance score by > 6 points (38/56) to demonstrate decreased fall risk during functional activities.    Baseline  1/4: 32/56    Time  8    Period  Weeks    Status  New    Target Date  08/30/19      PT LONG TERM GOAL #5   Title  Patient will increase ABC scale score >60% to demonstrate better functional mobility and better confidence with ADLs.    Baseline  1/4: 26.9%    Time  8    Period  Weeks    Status  New    Target Date  08/30/19             Plan - 07/05/19 1603    Clinical Impression Statement  Patient is a pleasant 74 year old female who presents to physical therapy for instability and gait deficits. Patient is accompanied by husband due to recent cognitive decline to assist with history. Patient demonstrates poor safety awareness with frequent posterior trunk lean/LOB requiring CGA, especially in regards to single limb tasks and dynamic mobility. Patient demonstrates excellent motivation and understanding of plan of care at this time. She will benefit from skilled physical therapy to increase mobility, stability, and strength for decreased falls risk and improved quality of life.    Personal Factors and Comorbidities  Age;Comorbidity 3+    Comorbidities  PMH of stroke, multiple fx's of lower legs, arthritis, asthma, a fib, CHF, depression, panic attacks, DM, GERD, HLD, HTN, neuropathy, osteoporosis, thyroid disease.    Examination-Activity Limitations  Bathing;Caring for Others;Carry;Dressing;Lift;Locomotion Level;Squat;Stairs;Stand;Toileting;Transfers    Examination-Participation Restrictions  Cleaning;Community Activity;Driving;Interpersonal Relationship;Meal  Prep;Laundry;Shop;Volunteer;Yard Work    [employer]  Evolving/Moderate complexity    Clinical Decision Making  Moderate    Rehab Potential  Fair    PT Frequency  2x / week    PT Duration  8 weeks    PT Treatment/Interventions  ADLs/Self Care Home Management;Aquatic Therapy;Cryotherapy;Electrical Stimulation;Moist Heat;Traction;Ultrasound;DME Instruction;Gait training;Stair training;Functional mobility training;Cognitive remediation;Neuromuscular re-education;Balance training;Therapeutic exercise;Therapeutic activities;Patient/family education;Manual techniques;Dry needling;Passive range of motion;Energy conservation;Taping;Vestibular    PT Next Visit Plan  give HEP, // bars stability, teach COM on stable/unstable surface    PT Home Exercise Plan  give next session    Consulted and Agree with Plan of Care  Patient;Family member/caregiver    Family Member Consulted  husband       Patient will benefit from skilled therapeutic intervention in order to improve the following deficits and impairments:  Abnormal gait, Decreased activity tolerance, Cardiopulmonary status limiting activity, Decreased balance, Decreased cognition, Decreased endurance, Decreased knowledge of precautions, Decreased safety awareness, Decreased mobility, Decreased strength, Difficulty walking, Improper body mechanics, Postural dysfunction, Pain  Visit Diagnosis: Other abnormalities of gait and mobility  Unsteadiness on feet  Muscle weakness (generalized)     Problem List Patient Active Problem List   Diagnosis Date Noted  . Tibia/fibula fracture, left, closed, initial encounter 05/19/2018  . S/P AVR (aortic valve replacement) 02/17/2017  . Diabetes (HCC) 01/29/2017  . Depression 01/29/2017  . Severe aortic stenosis 01/22/2017  . Chronic systolic CHF (congestive heart failure) (HCC) 01/22/2017  . Anemia 01/16/2017  . Acute CHF (congestive heart failure) (HCC) 01/16/2017  . Elevated  troponin 01/16/2017  . Hypokalemia 01/16/2017  . Chest pain 01/15/2017   Precious Bard, PT, DPT   07/05/2019, 5:28 PM  [facility] MAIN [facility] SERVICES [address] [place], [place], [address] Phone: [phone]   Fax:  [phone]  Name: Jenny [name] MRN: [id number] Date of Birth: Jul 12, 1945

## 2019-07-07 ENCOUNTER — Ambulatory Visit: Payer: Medicare Other

## 2019-07-12 ENCOUNTER — Ambulatory Visit: Payer: Medicare Other

## 2019-07-13 DIAGNOSIS — I119 Hypertensive heart disease without heart failure: Secondary | ICD-10-CM | POA: Diagnosis not present

## 2019-07-13 DIAGNOSIS — R410 Disorientation, unspecified: Secondary | ICD-10-CM | POA: Diagnosis not present

## 2019-07-13 DIAGNOSIS — M818 Other osteoporosis without current pathological fracture: Secondary | ICD-10-CM | POA: Diagnosis not present

## 2019-07-13 DIAGNOSIS — F419 Anxiety disorder, unspecified: Secondary | ICD-10-CM | POA: Diagnosis not present

## 2019-07-14 ENCOUNTER — Ambulatory Visit: Payer: Medicare Other

## 2019-07-14 ENCOUNTER — Other Ambulatory Visit: Payer: Self-pay

## 2019-07-14 DIAGNOSIS — R2681 Unsteadiness on feet: Secondary | ICD-10-CM | POA: Diagnosis not present

## 2019-07-14 DIAGNOSIS — M6281 Muscle weakness (generalized): Secondary | ICD-10-CM

## 2019-07-14 DIAGNOSIS — R2689 Other abnormalities of gait and mobility: Secondary | ICD-10-CM | POA: Diagnosis not present

## 2019-07-14 NOTE — Therapy (Signed)
[facility] MAIN [facility] SERVICES [address] [place], [place], [address] Phone: [phone]   Fax:  [phone]  Physical Therapy Treatment  Patient Details  Name: Jenny [name] MRN: [id number] Date of Birth: 1946-04-11 Referring Provider (PT): Theora Master   Encounter Date: 07/14/2019  PT End of Session - 07/14/19 1446    Visit Number  2    Number of Visits  16    Date for PT Re-Evaluation  08/30/19    Authorization Type  2/10 eval 07/05/19    PT Start Time  1432    PT Stop Time  1514    PT Time Calculation (min)  42 min    Equipment Utilized During Treatment  Gait belt    Activity Tolerance  Patient tolerated treatment well    Behavior During Therapy  [facility] for tasks assessed/performed;Anxious       Past Medical History:  Diagnosis Date  . Anxiety   . Arthritis   . CAD (coronary artery disease)    a. 01/2017 Cath: nonobs dzs.  . Chronic systolic CHF (congestive heart failure) (HCC)    a. TTE 12/2016, EF 45-50%, probable hypokinesis of the mid apical anteroseptal, anterior, & apical myocardium, GR2DD, possibly bicuspid aortic valve that was moderately thickened w/ severely calcified leaflets, severe aortic stenosis w/ mean gradient 47 mmHg, valve area 0.52 cm, mild MR, mildly dilated LA  . Depression   . Diabetes mellitus with complication (HCC)    Tylenol  . Hyperlipidemia   . Hypothyroidism   . IBS (irritable bowel syndrome)   . Iron deficiency anemia    a. s/p pRBC x1 in 12/2016  . Microcytic anemia   . Osteoporosis   . Panic attacks   . Severe aortic stenosis    a. TTE 12/2016: possibly bicuspid aortic valve that was moderately thickened with severely calcified leaflets. There was severe aortic stenosis with a mean gradient of 47 mmHg and valve area of 0.52 cm; b. 01/2017 s/p AVR w/ 19 mm bioprosthetic Magna Ease pericardial tissue valve, ser# [id number].  . Stroke (HCC) 2001  . TIA (transient ischemic attack) 2006    Past  Surgical History:  Procedure Laterality Date  . ABDOMINAL HYSTERECTOMY    . AORTIC VALVE REPLACEMENT N/A 02/17/2017   Procedure: AORTIC VALVE REPLACEMENT (AVR);  Surgeon: Donata Clay, Theron Arista, MD;  Location: [facility] OR;  Service: Open Heart Surgery;  Laterality: N/A;  . CATARACT EXTRACTION W/PHACO Right 04/09/2016   Procedure: CATARACT EXTRACTION PHACO AND INTRAOCULAR LENS PLACEMENT (IOC);  Surgeon: Galen Manila, MD;  Location: [facility] ORS;  Service: Ophthalmology;  Laterality: Right;  [place] 57.2 AP% 18.6 CDE 10.64 Fluid Pack lot # [id number]  . CATARACT EXTRACTION W/PHACO Left 12/29/2018   Procedure: CATARACT EXTRACTION PHACO AND INTRAOCULAR LENS PLACEMENT (IOC)  LEFT DIABETIC;  Surgeon: Galen Manila, MD;  Location: [facility];  Service: Ophthalmology;  Laterality: Left;  Diabetic - insuli and oral meds  . CHOLECYSTECTOMY    . COLONOSCOPY    . CORONARY ANGIOGRAPHY N/A 02/03/2017   Procedure: CORONARY ANGIOGRAPHY;  Surgeon: Iran Ouch, MD;  Location: [facility] INVASIVE CV LAB;  Service: Cardiovascular;  Laterality: N/A;  . FRACTURE SURGERY     LEFT ANKLE  . RIGHT AND LEFT HEART CATH Bilateral 02/03/2017   Procedure: Right and Left Heart Cath;  Surgeon: Iran Ouch, MD;  Location: [facility] INVASIVE CV LAB;  Service: Cardiovascular;  Laterality: Bilateral;  . TEE WITHOUT CARDIOVERSION N/A 02/17/2017   Procedure: TRANSESOPHAGEAL  ECHOCARDIOGRAM (TEE);  Surgeon: Prescott Gum, Collier Salina, MD;  Location: [place];  Service: Open Heart Surgery;  Laterality: N/A;  . TIBIA IM NAIL INSERTION Left 05/19/2018   Procedure: INTRAMEDULLARY (IM) NAIL TIBIAL;  Surgeon: Thornton Park, MD;  Location: [facility] ORS;  Service: Orthopedics;  Laterality: Left;    There were no vitals filed for this visit.  Subjective Assessment - 07/14/19 1439    Subjective  Patient reports one LOB when trying to get into closet. was able to get self back up. Is wearing L ankle brace for stability. Missed last session due to IBS irritation.     Patient is accompained by:  Family member    Pertinent History  Patient is a 74 year old female who presents with new difficulty walking and weakness in legs. Patient and husband report it being about a year. Has started having difficulty after fractures in LE in 2018. Per physician there is a decline in memory as seen in documented previous notes. Patient has had history of traumatic experiences in life (including watching son commit suicide in 70) and does have panic attacks. PMH of stroke, multiple fx's of lower legs, arthritis, asthma, a fib, CHF, depression, panic attacks, DM, GERD, HLD, HTN, neuropathy, osteoporosis, thyroid disease. Patient furniture surfaces at home, walker stays out all the time in case needed. Several small falls in past 6 months.    Limitations  Lifting;Standing;Walking;House hold activities    How long can you sit comfortably?  n/a    How long can you stand comfortably?  very few minutes    How long can you walk comfortably?  can walk across room    Patient Stated Goals  to get out of using a chair    Currently in Pain?  No/denies            NuStep Lvl 2 RPM >40 4 minutes. for cardiovascular and musculoskeletal challenge, max cueing for task orientation and velocity of movement.   HEP given and performed:    Access Code: [email or web address]  URL: [email or web address]  Date: 07/14/2019  Prepared by: Janna Arch   Exercises Seated March - 10 reps - 2 sets - 5 hold - 1x daily - 7x weekly Seated Long Arc Quad - 10 reps - 2 sets - 5 hold - 1x daily - 7x weekly Seated Hip Abduction with Resistance - 10 reps - 2 sets - 5 hold - 1x daily - 7x weekly Seated Hip Adduction Isometrics with Ball - 10 reps - 2 sets - 5 hold - 1x daily - 7x weekly  In // bars: CGA required with all standing interventions for stability and mobility.   airex pad -static stand 30-60 seconds x 3 trials with ankle instability -horizontal head turns 45 seconds, vertical head nods  30 seconds, increased unsteadiness  -airex pad 6" step: one foot on each 30 second holds x 2 trials each LE.  -airex pad: 6" step toe taps, SUE support 12x each LE   Side stepping 4x length of // bars, cueing for foot clearance of L foot with side stepping.   Large step to line on floor, and back 10x each LE; BUE support .   patient is very verbose and requires max task orientation due to tangential nature.      Pt educated throughout session about proper posture and technique with exercises. Improved exercise technique, movement at target joints, use of target muscles after min to mod verbal, visual, tactile cues.   Patient requires extensive cueing  for task orientation due to verbose/tangential nature of patient, however patient remains highly motivated for improved mobility and stability. Home exercise program performed with patient demonstrating and verbalizing understanding. Limited ankle righting reactions on unstable surfaces challenge patient's stability requiring close CGA. She will benefit from skilled physical therapy to increase mobility, stability, and strength for decreased falls risk and improved quality of life.             PT Education - 07/14/19 1446    Education Details  exercise technique, HEP,    Person(s) Educated  Patient    Methods  Explanation;Demonstration;Tactile cues;Verbal cues    Comprehension  Verbalized understanding;Returned demonstration;Verbal cues required;Tactile cues required       PT Short Term Goals - 07/05/19 1613      PT SHORT TERM GOAL #1   Title  Patient will be independent in home exercise program to improve strength/mobility for better functional independence with ADLs.    Baseline  1/4: HEP to be given next session    Time  2    Period  Weeks    Status  New    Target Date  07/19/19        PT Long Term Goals - 07/05/19 1724      PT LONG TERM GOAL #1   Title  Patient will increase their FOTO score by 13 points to increase  to 53% to demonstrate increased function and mobility for higher quality of life.    Baseline  1/4: 40/100    Time  8    Period  Weeks    Status  New    Target Date  08/30/19      PT LONG TERM GOAL #2   Title  Patient will increase BLE gross strength to 4+/5 as to improve functional strength for independent gait, increased standing tolerance and increased ADL ability.    Baseline  1/4; R grossly 3+/5 L 4-/5    Time  8    Period  Weeks    Status  New    Target Date  08/30/19      PT LONG TERM GOAL #3   Title  Patient will increase 10 meter walk test to >1.93m/s as to improve gait speed for better community ambulation and to reduce fall risk.    Baseline  1/4: 0.625 m/s with RW    Time  8    Period  Weeks    Status  New    Target Date  08/30/19      PT LONG TERM GOAL #4   Title  Patient will increase Berg Balance score by > 6 points (38/56) to demonstrate decreased fall risk during functional activities.    Baseline  1/4: 32/56    Time  8    Period  Weeks    Status  New    Target Date  08/30/19      PT LONG TERM GOAL #5   Title  Patient will increase ABC scale score >60% to demonstrate better functional mobility and better confidence with ADLs.    Baseline  1/4: 26.9%    Time  8    Period  Weeks    Status  New    Target Date  08/30/19            Plan - 07/14/19 1529    Clinical Impression Statement  Patient requires extensive cueing for task orientation due to verbose/tangential nature of patient, however patient remains highly motivated for improved mobility and  stability. Home exercise program performed with patient demonstrating and verbalizing understanding. Limited ankle righting reactions on unstable surfaces challenge patient's stability requiring close CGA. She will benefit from skilled physical therapy to increase mobility, stability, and strength for decreased falls risk and improved quality of life.    Personal Factors and Comorbidities  Age;Comorbidity 3+     Comorbidities  PMH of stroke, multiple fx's of lower legs, arthritis, asthma, a fib, CHF, depression, panic attacks, DM, GERD, HLD, HTN, neuropathy, osteoporosis, thyroid disease.    Examination-Activity Limitations  Bathing;Caring for Others;Carry;Dressing;Lift;Locomotion Level;Squat;Stairs;Stand;Toileting;Transfers    Examination-Participation Restrictions  Cleaning;Community Activity;Driving;Interpersonal Relationship;Meal Prep;Laundry;Shop;Volunteer;Yard Work    Conservation officer, historic buildings  Evolving/Moderate complexity    Rehab Potential  Fair    PT Frequency  2x / week    PT Duration  8 weeks    PT Treatment/Interventions  ADLs/Self Care Home Management;Aquatic Therapy;Cryotherapy;Electrical Stimulation;Moist Heat;Traction;Ultrasound;DME Instruction;Gait training;Stair training;Functional mobility training;Cognitive remediation;Neuromuscular re-education;Balance training;Therapeutic exercise;Therapeutic activities;Patient/family education;Manual techniques;Dry needling;Passive range of motion;Energy conservation;Taping;Vestibular    PT Next Visit Plan  unstable surfaces, LE strength    PT Home Exercise Plan  give next session    Consulted and Agree with Plan of Care  Patient;Family member/caregiver    Family Member Consulted  husband       Patient will benefit from skilled therapeutic intervention in order to improve the following deficits and impairments:  Abnormal gait, Decreased activity tolerance, Cardiopulmonary status limiting activity, Decreased balance, Decreased cognition, Decreased endurance, Decreased knowledge of precautions, Decreased safety awareness, Decreased mobility, Decreased strength, Difficulty walking, Improper body mechanics, Postural dysfunction, Pain  Visit Diagnosis: Other abnormalities of gait and mobility  Unsteadiness on feet  Muscle weakness (generalized)     Problem List Patient Active Problem List   Diagnosis Date Noted  . Tibia/fibula  fracture, left, closed, initial encounter 05/19/2018  . S/P AVR (aortic valve replacement) 02/17/2017  . Diabetes (HCC) 01/29/2017  . Depression 01/29/2017  . Severe aortic stenosis 01/22/2017  . Chronic systolic CHF (congestive heart failure) (HCC) 01/22/2017  . Anemia 01/16/2017  . Acute CHF (congestive heart failure) (HCC) 01/16/2017  . Elevated troponin 01/16/2017  . Hypokalemia 01/16/2017  . Chest pain 01/15/2017   Precious Bard, PT, DPT   07/14/2019, 3:30 PM  [place] [facility] MAIN [facility] SERVICES [address] [place], [place], [address] Phone: [phone]   Fax:  [phone]  Name: SANJA ELIZARDO MRN: [id number] Date of Birth: 1946-04-29

## 2019-07-19 ENCOUNTER — Ambulatory Visit: Payer: Medicare Other

## 2019-07-21 ENCOUNTER — Ambulatory Visit: Payer: Medicare Other

## 2019-07-26 ENCOUNTER — Other Ambulatory Visit: Payer: Self-pay

## 2019-07-26 ENCOUNTER — Ambulatory Visit: Payer: Medicare Other

## 2019-07-26 DIAGNOSIS — M6281 Muscle weakness (generalized): Secondary | ICD-10-CM

## 2019-07-26 DIAGNOSIS — R2689 Other abnormalities of gait and mobility: Secondary | ICD-10-CM

## 2019-07-26 DIAGNOSIS — R2681 Unsteadiness on feet: Secondary | ICD-10-CM | POA: Diagnosis not present

## 2019-07-26 NOTE — Therapy (Signed)
[place] MAIN [facility] SERVICES [address] [place], [place], [address] Phone: [phone]   Fax:  [phone]  Physical Therapy Treatment  Patient Details  Name: Jenny [name] MRN: [id number] Date of Birth: 11-11-1945 Referring Provider (PT): Gurney Maxin   Encounter Date: 07/26/2019  PT End of Session - 07/26/19 1611    Visit Number  3    Number of Visits  16    Date for PT Re-Evaluation  08/30/19    Authorization Type  3/10 eval 07/05/19    PT Start Time  1430    PT Stop Time  1514    PT Time Calculation (min)  44 min    Equipment Utilized During Treatment  Gait belt    Activity Tolerance  Patient tolerated treatment well    Behavior During Therapy  [facility] for tasks assessed/performed;Anxious       Past Medical History:  Diagnosis Date  . Anxiety   . Arthritis   . CAD (coronary artery disease)    a. 01/2017 Cath: nonobs dzs.  . Chronic systolic CHF (congestive heart failure) ([place])    a. TTE 12/2016, EF 45-50%, probable hypokinesis of the mid apical anteroseptal, anterior, & apical myocardium, GR2DD, possibly bicuspid aortic valve that was moderately thickened w/ severely calcified leaflets, severe aortic stenosis w/ mean gradient 47 mmHg, valve area 0.52 cm, mild MR, mildly dilated LA  . Depression   . Diabetes mellitus with complication (HCC)    Tylenol  . Hyperlipidemia   . Hypothyroidism   . IBS (irritable bowel syndrome)   . Iron deficiency anemia    a. s/p pRBC x1 in 12/2016  . Microcytic anemia   . Osteoporosis   . Panic attacks   . Severe aortic stenosis    a. TTE 12/2016: possibly bicuspid aortic valve that was moderately thickened with severely calcified leaflets. There was severe aortic stenosis with a mean gradient of 47 mmHg and valve area of 0.52 cm; b. 01/2017 s/p AVR w/ 19 mm bioprosthetic Magna Ease pericardial tissue valve, ser# [id number].  . Stroke ([place]) 2001  . TIA (transient ischemic attack) 2006    Past  Surgical History:  Procedure Laterality Date  . ABDOMINAL HYSTERECTOMY    . AORTIC VALVE REPLACEMENT N/A 02/17/2017   Procedure: AORTIC VALVE REPLACEMENT (AVR);  Surgeon: Prescott Gum, Collier Salina, MD;  Location: [place];  Service: Open Heart Surgery;  Laterality: N/A;  . CATARACT EXTRACTION W/PHACO Right 04/09/2016   Procedure: CATARACT EXTRACTION PHACO AND INTRAOCULAR LENS PLACEMENT (IOC);  Surgeon: Birder Robson, MD;  Location: [facility];  Service: Ophthalmology;  Laterality: Right;  [place] 57.2 AP% 18.6 CDE 10.64 Fluid Pack lot # [id number]  . CATARACT EXTRACTION W/PHACO Left 12/29/2018   Procedure: CATARACT EXTRACTION PHACO AND INTRAOCULAR LENS PLACEMENT ([place])  LEFT DIABETIC;  Surgeon: Birder Robson, MD;  Location: [place];  Service: Ophthalmology;  Laterality: Left;  Diabetic - insuli and oral meds  . CHOLECYSTECTOMY    . COLONOSCOPY    . CORONARY ANGIOGRAPHY N/A 02/03/2017   Procedure: CORONARY ANGIOGRAPHY;  Surgeon: Wellington Hampshire, MD;  Location: [place] [facility];  Service: Cardiovascular;  Laterality: N/A;  . FRACTURE SURGERY     LEFT ANKLE  . RIGHT AND LEFT HEART CATH Bilateral 02/03/2017   Procedure: Right and Left Heart Cath;  Surgeon: Wellington Hampshire, MD;  Location: [place] CV [facility];  Service: Cardiovascular;  Laterality: Bilateral;  . TEE WITHOUT CARDIOVERSION N/A 02/17/2017   Procedure: TRANSESOPHAGEAL  ECHOCARDIOGRAM (TEE);  Surgeon: Prescott Gum, Collier Salina, MD;  Location: [place];  Service: Open Heart Surgery;  Laterality: N/A;  . TIBIA IM NAIL INSERTION Left 05/19/2018   Procedure: INTRAMEDULLARY (IM) NAIL TIBIAL;  Surgeon: Thornton Park, MD;  Location: [facility];  Service: Orthopedics;  Laterality: Left;    There were no vitals filed for this visit.  Subjective Assessment - 07/26/19 1448    Subjective  Patient reports no LOB or falls since last session. Missed last session due to IBS    Patient is accompained by:  Family member    Pertinent History  Patient is a 74  year old female who presents with new difficulty walking and weakness in legs. Patient and husband report it being about a year. Has started having difficulty after fractures in LE in 2018. Per physician there is a decline in memory as seen in documented previous notes. Patient has had history of traumatic experiences in life (including watching son commit suicide in 86) and does have panic attacks. PMH of stroke, multiple fx's of lower legs, arthritis, asthma, a fib, CHF, depression, panic attacks, DM, GERD, HLD, HTN, neuropathy, osteoporosis, thyroid disease. Patient furniture surfaces at home, walker stays out all the time in case needed. Several small falls in past 6 months.    Limitations  Lifting;Standing;Walking;House hold activities    How long can you sit comfortably?  n/a    How long can you stand comfortably?  very few minutes    How long can you walk comfortably?  can walk across room    Patient Stated Goals  to get out of using a chair    Currently in Pain?  No/denies            Treatment:  Nustep Lvl 2 RPM>60 for cardiovascular challenge; 3 minutes.    Standing in // bars: CGA with mod/max cueing for task orientation/sequencing for safety and safety awareness.  Hip extension with correction of posture for upright posture; 10x each LE, reports discomfort in RLE after performance, challenging keeping knee extended  Circuit: side step over hurdles, and half foam roller with max cueing for keeping enough room for other foot when stepping over, BUE support. and then walking forward outside // bars for full circle with CGA, improved with second repetition however unable to perform when distracted. Three times total.   airex pad tandem stance modified one foot on each color airex pad 45 seconds each position each LE, 2 sets each LE ; mac cueing for task orientation, frequent LOB when with distraction  6 cones placed throughout gym: scanning room horizontal and vertically for cones  (head turns) with ambulation around gym with CGA and Min A for carryover for grocery shopping and ambulation at home. Frequent cueing for widening BOS and decreasing foot shuffle.    Seated: Sit to stand no UE support 10x  Seated marching with upright posture arms crossed 12x with tactile cueing for upright posture.  RTB resisted marching 15x each LE, max cueing for sequencing.  GTB abduction tactile cueing for amplitude of movement 15x   patient is very verbose and requires max task orientation due to tangential nature.      Pt educated throughout session about proper posture and technique with exercises. Improved exercise technique, movement at target joints, use of target muscles after min to mod verbal, visual, tactile cues.                 PT Education - 07/26/19 1611  Education Details  exercise technique, body mechanics    Person(s) Educated  Patient    Methods  Explanation;Demonstration;Tactile cues;Verbal cues    Comprehension  Verbalized understanding;Returned demonstration;Verbal cues required;Tactile cues required       PT Short Term Goals - 07/05/19 1613      PT SHORT TERM GOAL #1   Title  Patient will be independent in home exercise program to improve strength/mobility for better functional independence with ADLs.    Baseline  1/4: HEP to be given next session    Time  2    Period  Weeks    Status  New    Target Date  07/19/19        PT Long Term Goals - 07/05/19 1724      PT LONG TERM GOAL #1   Title  Patient will increase their FOTO score by 13 points to increase to 53% to demonstrate increased function and mobility for higher quality of life.    Baseline  1/4: 40/100    Time  8    Period  Weeks    Status  New    Target Date  08/30/19      PT LONG TERM GOAL #2   Title  Patient will increase BLE gross strength to 4+/5 as to improve functional strength for independent gait, increased standing tolerance and increased ADL ability.    Baseline   1/4; R grossly 3+/5 L 4-/5    Time  8    Period  Weeks    Status  New    Target Date  08/30/19      PT LONG TERM GOAL #3   Title  Patient will increase 10 meter walk test to >1.3ms as to improve gait speed for better community ambulation and to reduce fall risk.    Baseline  1/4: 0.625 m/s with RW    Time  8    Period  Weeks    Status  New    Target Date  08/30/19      PT LONG TERM GOAL #4   Title  Patient will increase Berg Balance score by > 6 points (38/56) to demonstrate decreased fall risk during functional activities.    Baseline  1/4: 32/56    Time  8    Period  Weeks    Status  New    Target Date  08/30/19      PT LONG TERM GOAL #5   Title  Patient will increase ABC scale score >60% to demonstrate better functional mobility and better confidence with ADLs.    Baseline  1/4: 26.9%    Time  8    Period  Weeks    Status  New    Target Date  08/30/19            Plan - 07/26/19 1614    Clinical Impression Statement  Patient is challenged with spatial awareness and coordination of feet with limited task orientation with distraction resulting in LOB. Dual tasking additionally results in regression of ambulatory mechanics with increased shuffle step and narrowing BOS. Patient continues to demonstrate good motivation despite tangential nature with good progress between sessions. She will benefit from skilled physical therapy to increase mobility, stability, and strength for decreased falls risk and improved quality of life.    Personal Factors and Comorbidities  Age;Comorbidity 3+    Comorbidities  PMH of stroke, multiple fx's of lower legs, arthritis, asthma, a fib, CHF, depression, panic attacks, DM, GERD, HLD,  HTN, neuropathy, osteoporosis, thyroid disease.    Examination-Activity Limitations  Bathing;Caring for Others;Carry;Dressing;Lift;Locomotion Level;Squat;Stairs;Stand;Toileting;Transfers    Examination-Participation Restrictions  Cleaning;Community  Activity;Driving;Interpersonal Relationship;Meal Prep;Laundry;Shop;Volunteer;Yard Work    Merchant navy officer  Evolving/Moderate complexity    Rehab Potential  Fair    PT Frequency  2x / week    PT Duration  8 weeks    PT Treatment/Interventions  ADLs/Self Care Home Management;Aquatic Therapy;Cryotherapy;Electrical Stimulation;Moist Heat;Traction;Ultrasound;DME Instruction;Gait training;Stair training;Functional mobility training;Cognitive remediation;Neuromuscular re-education;Balance training;Therapeutic exercise;Therapeutic activities;Patient/family education;Manual techniques;Dry needling;Passive range of motion;Energy conservation;Taping;Vestibular    PT Next Visit Plan  unstable surfaces, LE strength    PT Home Exercise Plan  give next session    Consulted and Agree with Plan of Care  Patient;Family member/caregiver    Family Member Consulted  husband       Patient will benefit from skilled therapeutic intervention in order to improve the following deficits and impairments:  Abnormal gait, Decreased activity tolerance, Cardiopulmonary status limiting activity, Decreased balance, Decreased cognition, Decreased endurance, Decreased knowledge of precautions, Decreased safety awareness, Decreased mobility, Decreased strength, Difficulty walking, Improper body mechanics, Postural dysfunction, Pain  Visit Diagnosis: Other abnormalities of gait and mobility  Unsteadiness on feet  Muscle weakness (generalized)     Problem List Patient Active Problem List   Diagnosis Date Noted  . Tibia/fibula fracture, left, closed, initial encounter 05/19/2018  . S/P AVR (aortic valve replacement) 02/17/2017  . Diabetes ([place]) 01/29/2017  . Depression 01/29/2017  . Severe aortic stenosis 01/22/2017  . Chronic systolic CHF (congestive heart failure) ([place]) 01/22/2017  . Anemia 01/16/2017  . Acute CHF (congestive heart failure) ([place]) 01/16/2017  . Elevated troponin 01/16/2017  .  Hypokalemia 01/16/2017  . Chest pain 01/15/2017   Janna Arch, PT, DPT   07/26/2019, 4:15 PM  [place] MAIN [facility] SERVICES [address] [place], [place], [address] Phone: [phone]   Fax:  [phone]  Name: Jenny [name] MRN: [id number] Date of Birth: 1945/10/26

## 2019-07-28 ENCOUNTER — Other Ambulatory Visit: Payer: Self-pay

## 2019-07-28 ENCOUNTER — Ambulatory Visit: Payer: Medicare Other

## 2019-07-28 DIAGNOSIS — R2689 Other abnormalities of gait and mobility: Secondary | ICD-10-CM

## 2019-07-28 DIAGNOSIS — R2681 Unsteadiness on feet: Secondary | ICD-10-CM | POA: Diagnosis not present

## 2019-07-28 DIAGNOSIS — M6281 Muscle weakness (generalized): Secondary | ICD-10-CM

## 2019-07-28 NOTE — Therapy (Signed)
[facility] MAIN [facility] SERVICES [address] [place], [place], [address] Phone: [phone]   Fax:  [phone]  Physical Therapy Treatment  Patient Details  Name: Jenny [name] MRN: [id number] Date of Birth: [birth year] Referring Provider (PT): Theora Master   Encounter Date: 07/28/2019  PT End of Session - 07/28/19 1450    Visit Number  4    Number of Visits  16    Date for PT Re-Evaluation  08/30/19    Authorization Type  4/10 eval 07/05/19    PT Start Time  1430    PT Stop Time  1514    PT Time Calculation (min)  44 min    Equipment Utilized During Treatment  Gait belt    Activity Tolerance  Patient tolerated treatment well    Behavior During Therapy  [facility] for tasks assessed/performed;Anxious       Past Medical History:  Diagnosis Date  . Anxiety   . Arthritis   . CAD (coronary artery disease)    a. 01/2017 Cath: nonobs dzs.  . Chronic systolic CHF (congestive heart failure) (HCC)    a. TTE 12/2016, EF 45-50%, probable hypokinesis of the mid apical anteroseptal, anterior, & apical myocardium, GR2DD, possibly bicuspid aortic valve that was moderately thickened w/ severely calcified leaflets, severe aortic stenosis w/ mean gradient 47 mmHg, valve area 0.52 cm, mild MR, mildly dilated LA  . Depression   . Diabetes mellitus with complication (HCC)    Tylenol  . Hyperlipidemia   . Hypothyroidism   . IBS (irritable bowel syndrome)   . Iron deficiency anemia    a. s/p pRBC x1 in 12/2016  . Microcytic anemia   . Osteoporosis   . Panic attacks   . Severe aortic stenosis    a. TTE 12/2016: possibly bicuspid aortic valve that was moderately thickened with severely calcified leaflets. There was severe aortic stenosis with a mean gradient of 47 mmHg and valve area of 0.52 cm; b. 01/2017 s/p AVR w/ 19 mm bioprosthetic Magna Ease pericardial tissue valve, ser# [id number].  . Stroke (HCC) 2001  . TIA (transient ischemic attack) 2006    Past  Surgical History:  Procedure Laterality Date  . ABDOMINAL HYSTERECTOMY    . AORTIC VALVE REPLACEMENT N/A 02/17/2017   Procedure: AORTIC VALVE REPLACEMENT (AVR);  Surgeon: Donata Clay, Theron Arista, MD;  Location: [facility] OR;  Service: Open Heart Surgery;  Laterality: N/A;  . CATARACT EXTRACTION W/PHACO Right 04/09/2016   Procedure: CATARACT EXTRACTION PHACO AND INTRAOCULAR LENS PLACEMENT (IOC);  Surgeon: Galen Manila, MD;  Location: [facility];  Service: Ophthalmology;  Laterality: Right;  [place] 57.2 AP% 18.6 CDE 10.64 Fluid Pack lot # [id number]  . CATARACT EXTRACTION W/PHACO Left 12/29/2018   Procedure: CATARACT EXTRACTION PHACO AND INTRAOCULAR LENS PLACEMENT (IOC)  LEFT DIABETIC;  Surgeon: Galen Manila, MD;  Location: [facility];  Service: Ophthalmology;  Laterality: Left;  Diabetic - insuli and oral meds  . CHOLECYSTECTOMY    . COLONOSCOPY    . CORONARY ANGIOGRAPHY N/A 02/03/2017   Procedure: CORONARY ANGIOGRAPHY;  Surgeon: Iran Ouch, MD;  Location: [facility] IN[facility]AS[facility];  Service: Cardiovascular;  Laterality: N/A;  . FRACTURE SURGERY     LEFT ANKLE  . RIGHT AND LEFT HEART CATH Bilateral 02/03/2017   Procedure: Right and Left Heart Cath;  Surgeon: Iran Ouch, MD;  Location: [facility];  Service: Cardiovascular;  Laterality: Bilateral;  . TEE WITHOUT CARDIOVERSION N/A 02/17/2017   Procedure: TRANSESOPHAGEAL  ECHOCARDIOGRAM (TEE);  Surgeon: Donata Clay, Theron Arista, MD;  Location: [facility] OR;  Service: Open Heart Surgery;  Laterality: N/A;  . TIBIA IM NAIL INSERTION Left 05/19/2018   Procedure: INTRAMEDULLARY (IM) NAIL TIBIAL;  Surgeon: Juanell Fairly, MD;  Location: [facility];  Service: Orthopedics;  Laterality: Left;    There were no vitals filed for this visit.  Subjective Assessment - 07/28/19 1435    Subjective  Patient reports no falls or LOB since last session. Had IBS last session. Is having some cracking/popping in knee.    Patient is accompained by:  Family member     Pertinent History  Patient is a 74 year old female who presents with new difficulty walking and weakness in legs. Patient and husband report it being about a year. Has started having difficulty after fractures in LE in 2018. Per physician there is a decline in memory as seen in documented previous notes. Patient has had history of traumatic experiences in life (including watching son commit suicide in 38) and does have panic attacks. PMH of stroke, multiple fx's of lower legs, arthritis, asthma, a fib, CHF, depression, panic attacks, DM, GERD, HLD, HTN, neuropathy, osteoporosis, thyroid disease. Patient furniture surfaces at home, walker stays out all the time in case needed. Several small falls in past 6 months.    Limitations  Lifting;Standing;Walking;House hold activities    How long can you sit comfortably?  n/a    How long can you stand comfortably?  very few minutes    How long can you walk comfortably?  can walk across room    Patient Stated Goals  to get out of using a chair    Currently in Pain?  Yes    Pain Score  2     Pain Location  Knee    Pain Orientation  Right    Pain Descriptors / Indicators  Aching    Pain Type  Chronic pain        Treatment: Nustep Lvl 2-3 ; 4 minutes Rpm> 50 max cueing for increased velocity for challenge with cardiovascular and musculoskeletal systems. Occasional R knee pain requiring reduction to level 2    Speed ladder: Forward stepping in speed ladder one foot to each box; decreasing UE support from SUE to two fingers to no UE support, cueing for widened BOS 10x   Lateral stepping in speed ladder two feet each box; cueing for widening step length, 2x length.   Standing cone taps 3 cones, 5x each LE, PT ceuing for sequencing , SUE support with heavy reliance on UE, frequent knocking over of cone.   airex pad: static stand with eyes closed 30 seconds , eyes open 60 seconds, frequent posterior LOB requiring tactile cueing to abdomen for activation.    airex pad under feet, sit to stand 10x, max cueing for sequencing, requires UE support for stability during transfer. Max cueing for task orientation.   Ambulate with horizontal head turns 40 ft, CGA, very narrow BOS with one episode of LOB  Ambulate on either side of 2.x4 for widened BOS  6x length   Seated: #2.5 ankle weights Seated marching 10x each LE LAQ 10x each LE focus on slow muscle contraction Straight leg abduction/ adduction. 10x each LE.  RTB df 12x each LE.   patient is very verbose and requires max task orientation due to tangential nature.      Pt educated throughout session about proper posture and technique with exercises. Improved exercise technique, movement at target joints,  use of target muscles after min to mod verbal, visual, tactile cues.                        PT Education - 07/28/19 1436    Education Details  exercise technique, body mechanics    Person(s) Educated  Patient    Methods  Explanation;Demonstration;Tactile cues;Verbal cues    Comprehension  Verbalized understanding;Returned demonstration;Verbal cues required;Tactile cues required       PT Short Term Goals - 07/05/19 1613      PT SHORT TERM GOAL #1   Title  Patient will be independent in home exercise program to improve strength/mobility for better functional independence with ADLs.    Baseline  1/4: HEP to be given next session    Time  2    Period  Weeks    Status  New    Target Date  07/19/19        PT Long Term Goals - 07/05/19 1724      PT LONG TERM GOAL #1   Title  Patient will increase their FOTO score by 13 points to increase to 53% to demonstrate increased function and mobility for higher quality of life.    Baseline  1/4: 40/100    Time  8    Period  Weeks    Status  New    Target Date  08/30/19      PT LONG TERM GOAL #2   Title  Patient will increase BLE gross strength to 4+/5 as to improve functional strength for independent gait, increased  standing tolerance and increased ADL ability.    Baseline  1/4; R grossly 3+/5 L 4-/5    Time  8    Period  Weeks    Status  New    Target Date  08/30/19      PT LONG TERM GOAL #3   Title  Patient will increase 10 meter walk test to >1.64m/s as to improve gait speed for better community ambulation and to reduce fall risk.    Baseline  1/4: 0.625 m/s with RW    Time  8    Period  Weeks    Status  New    Target Date  08/30/19      PT LONG TERM GOAL #4   Title  Patient will increase Berg Balance score by > 6 points (38/56) to demonstrate decreased fall risk during functional activities.    Baseline  1/4: 32/56    Time  8    Period  Weeks    Status  New    Target Date  08/30/19      PT LONG TERM GOAL #5   Title  Patient will increase ABC scale score >60% to demonstrate better functional mobility and better confidence with ADLs.    Baseline  1/4: 26.9%    Time  8    Period  Weeks    Status  New    Target Date  08/30/19            Plan - 07/28/19 1522    Clinical Impression Statement  Patient presents to physical therapy with excellent motivation. Progression of stability interventions performed with decreasing UE support and stabilization. As UE support decreased patient demonstrated regression of gait mechanics to narrowing BOS resulting in increased episodes of posterior LOB. She will benefit from skilled physical therapy to increase mobility, stability, and strength for decreased falls risk and improved quality of life  Personal Factors and Comorbidities  Age;Comorbidity 3+    Comorbidities  PMH of stroke, multiple fx's of lower legs, arthritis, asthma, a fib, CHF, depression, panic attacks, DM, GERD, HLD, HTN, neuropathy, osteoporosis, thyroid disease.    Examination-Activity Limitations  Bathing;Caring for Others;Carry;Dressing;Lift;Locomotion Level;Squat;Stairs;Stand;Toileting;Transfers    Examination-Participation Restrictions  Cleaning;Community  Activity;Driving;Interpersonal Relationship;Meal Prep;Laundry;Shop;Volunteer;Yard Work    Conservation officer, historic buildings  Evolving/Moderate complexity    Rehab Potential  Fair    PT Frequency  2x / week    PT Duration  8 weeks    PT Treatment/Interventions  ADLs/Self Care Home Management;Aquatic Therapy;Cryotherapy;Electrical Stimulation;Moist Heat;Traction;Ultrasound;DME Instruction;Gait training;Stair training;Functional mobility training;Cognitive remediation;Neuromuscular re-education;Balance training;Therapeutic exercise;Therapeutic activities;Patient/family education;Manual techniques;Dry needling;Passive range of motion;Energy conservation;Taping;Vestibular    PT Next Visit Plan  unstable surfaces, LE strength    PT Home Exercise Plan  give next session    Consulted and Agree with Plan of Care  Patient;Family member/caregiver    Family Member Consulted  husband       Patient will benefit from skilled therapeutic intervention in order to improve the following deficits and impairments:  Abnormal gait, Decreased activity tolerance, Cardiopulmonary status limiting activity, Decreased balance, Decreased cognition, Decreased endurance, Decreased knowledge of precautions, Decreased safety awareness, Decreased mobility, Decreased strength, Difficulty walking, Improper body mechanics, Postural dysfunction, Pain  Visit Diagnosis: Other abnormalities of gait and mobility  Unsteadiness on feet  Muscle weakness (generalized)     Problem List Patient Active Problem List   Diagnosis Date Noted  . Tibia/fibula fracture, left, closed, initial encounter 05/19/2018  . S/P AVR (aortic valve replacement) 02/17/2017  . Diabetes (HCC) 01/29/2017  . Depression 01/29/2017  . Severe aortic stenosis 01/22/2017  . Chronic systolic CHF (congestive heart failure) (HCC) 01/22/2017  . Anemia 01/16/2017  . Acute CHF (congestive heart failure) (HCC) 01/16/2017  . Elevated troponin 01/16/2017  .  Hypokalemia 01/16/2017  . Chest pain 01/15/2017   Precious Bard, PT, DPT   07/28/2019, 3:23 PM  [place] [facility] MAIN [facility] SERVICES [address] [place], [place], [address] Phone: [phone]   Fax:  [phone]  Name: Jenny [name] MRN: [id number] Date of Birth: 1945/08/01

## 2019-08-02 ENCOUNTER — Other Ambulatory Visit: Payer: Self-pay

## 2019-08-02 ENCOUNTER — Ambulatory Visit: Payer: Medicare Other | Attending: Neurology

## 2019-08-02 DIAGNOSIS — R2681 Unsteadiness on feet: Secondary | ICD-10-CM | POA: Insufficient documentation

## 2019-08-02 DIAGNOSIS — M6281 Muscle weakness (generalized): Secondary | ICD-10-CM | POA: Insufficient documentation

## 2019-08-02 DIAGNOSIS — R2689 Other abnormalities of gait and mobility: Secondary | ICD-10-CM | POA: Diagnosis not present

## 2019-08-02 NOTE — Therapy (Signed)
[place] MAIN [facility] SERVICES [address] [place], [place], [address] Phone: [phone]   Fax:  [phone]  Physical Therapy Treatment  Patient Details  Name: COURTLYN AKI MRN: [id number] Date of Birth: 1945-08-31 Referring Provider (PT): Gurney Maxin   Encounter Date: 08/02/2019  PT End of Session - 08/02/19 1706    Visit Number  5    Number of Visits  16    Date for PT Re-Evaluation  08/30/19    Authorization Type  5/10 eval 07/05/19    PT Start Time  1430    PT Stop Time  1512    PT Time Calculation (min)  42 min    Equipment Utilized During Treatment  Gait belt    Activity Tolerance  Patient tolerated treatment well    Behavior During Therapy  [facility] for tasks assessed/performed;Anxious       Past Medical History:  Diagnosis Date  . Anxiety   . Arthritis   . CAD (coronary artery disease)    a. 01/2017 Cath: nonobs dzs.  . Chronic systolic CHF (congestive heart failure) ([place])    a. TTE 12/2016, EF 45-50%, probable hypokinesis of the mid apical anteroseptal, anterior, & apical myocardium, GR2DD, possibly bicuspid aortic valve that was moderately thickened w/ severely calcified leaflets, severe aortic stenosis w/ mean gradient 47 mmHg, valve area 0.52 cm, mild MR, mildly dilated LA  . Depression   . Diabetes mellitus with complication (HCC)    Tylenol  . Hyperlipidemia   . Hypothyroidism   . IBS (irritable bowel syndrome)   . Iron deficiency anemia    a. s/p pRBC x1 in 12/2016  . Microcytic anemia   . Osteoporosis   . Panic attacks   . Severe aortic stenosis    a. TTE 12/2016: possibly bicuspid aortic valve that was moderately thickened with severely calcified leaflets. There was severe aortic stenosis with a mean gradient of 47 mmHg and valve area of 0.52 cm; b. 01/2017 s/p AVR w/ 19 mm bioprosthetic Magna Ease pericardial tissue valve, ser# [id number].  . Stroke ([place]) 2001  . TIA (transient ischemic attack) 2006    Past  Surgical History:  Procedure Laterality Date  . ABDOMINAL HYSTERECTOMY    . AORTIC VALVE REPLACEMENT N/A 02/17/2017   Procedure: AORTIC VALVE REPLACEMENT (AVR);  Surgeon: Prescott Gum, Collier Salina, MD;  Location: [place];  Service: Open Heart Surgery;  Laterality: N/A;  . CATARACT EXTRACTION W/PHACO Right 04/09/2016   Procedure: CATARACT EXTRACTION PHACO AND INTRAOCULAR LENS PLACEMENT (IOC);  Surgeon: Birder Robson, MD;  Location: [facility];  Service: Ophthalmology;  Laterality: Right;  [place] 57.2 AP% 18.6 CDE 10.64 Fluid Pack lot # [id number]  . CATARACT EXTRACTION W/PHACO Left 12/29/2018   Procedure: CATARACT EXTRACTION PHACO AND INTRAOCULAR LENS PLACEMENT ([place])  LEFT DIABETIC;  Surgeon: Birder Robson, MD;  Location: [place];  Service: Ophthalmology;  Laterality: Left;  Diabetic - insuli and oral meds  . CHOLECYSTECTOMY    . COLONOSCOPY    . CORONARY ANGIOGRAPHY N/A 02/03/2017   Procedure: CORONARY ANGIOGRAPHY;  Surgeon: Wellington Hampshire, MD;  Location: [place] CV LAB;  Service: Cardiovascular;  Laterality: N/A;  . FRACTURE SURGERY     LEFT ANKLE  . RIGHT AND LEFT HEART CATH Bilateral 02/03/2017   Procedure: Right and Left Heart Cath;  Surgeon: Wellington Hampshire, MD;  Location: [place] CV LAB;  Service: Cardiovascular;  Laterality: Bilateral;  . TEE WITHOUT CARDIOVERSION N/A 02/17/2017   Procedure: TRANSESOPHAGEAL  ECHOCARDIOGRAM (TEE);  Surgeon: Donata Clay, Theron Arista, MD;  Location: [facility] OR;  Service: Open Heart Surgery;  Laterality: N/A;  . TIBIA IM NAIL INSERTION Left 05/19/2018   Procedure: INTRAMEDULLARY (IM) NAIL TIBIAL;  Surgeon: Juanell Fairly, MD;  Location: [facility]S;  Service: Orthopedics;  Laterality: Left;    There were no vitals filed for this visit.  Subjective Assessment - 08/02/19 1434    Subjective  Patient reports feeling very tired today, not feeling up for a lot but willing to try. Reports having some abdominal pain/back pain since yesterday.    Patient is  accompained by:  Family member    Pertinent History  Patient is a 74 year old female who presents with new difficulty walking and weakness in legs. Patient and husband report it being about a year. Has started having difficulty after fractures in LE in 2018. Per physician there is a decline in memory as seen in documented previous notes. Patient has had history of traumatic experiences in life (including watching son commit suicide in 74) and does have panic attacks. PMH of stroke, multiple fx's of lower legs, arthritis, asthma, a fib, CHF, depression, panic attacks, DM, GERD, HLD, HTN, neuropathy, osteoporosis, thyroid disease. Patient furniture surfaces at home, walker stays out all the time in case needed. Several small falls in past 6 months.    Limitations  Lifting;Standing;Walking;House hold activities    How long can you sit comfortably?  n/a    How long can you stand comfortably?  very few minutes    How long can you walk comfortably?  can walk across room    Patient Stated Goals  to get out of using a chair    Currently in Pain?  Yes    Pain Score  3     Pain Location  Abdomen    Pain Orientation  --   front to back   Pain Descriptors / Indicators  Aching    Pain Type  Chronic pain    Pain Onset  Yesterday    Pain Frequency  Constant    Aggravating Factors   started yesterday with no cause    Pain Relieving Factors  reports no relief      Nustep Lvl 2 RPM> 60; 4 minutes  for cardiovascular support, max cueing for task orientation.    Standing with CGA:    Standing balloon taps reaching inside/outside BOS with two near LOB requiring CGA/MinA for stabilization x 2 minutes  Imitate SPT with visual cueing for body mechanics; reaching inside/outside BOS UE movement, cueing for body mechanics. CGA required with one near LOB x 2 minutes   7" step toe taps 10x each LE, SUE support right hip discomfort.   Scanning room for cones with ambulation with horizontal and vertical head  turns. reaching and picking up cone for replication of home and grocery store.   Step over red theraband on floor and back, 15x with RLE, requires SUE support with LLE. 15x.   Sit to stand with rainbow ball press 10x -terminated due to pain    Seated: #2.5 ankle weights LAQ 10x each LE focus on slow muscle contraction  RTB df 12x each LE.    RTB abduction one LE at a time ; 15x each LE  Seated ball squeeze 5 second holds adduction 15x   Seated adduction ball squeeze between feet with LAQ 15x  patient is very verbose and requires max task orientation due to tangential nature.      Pt  educated throughout session about proper posture and technique with exercises. Improved exercise technique, movement at target joints, use of target muscles after min to mod verbal, visual, tactile cues.                  PT Education - 08/02/19 1436    Education Details  exercise technique, body mechanics    Person(s) Educated  Patient    Methods  Explanation;Demonstration;Tactile cues;Verbal cues    Comprehension  Verbalized understanding;Returned demonstration;Verbal cues required;Tactile cues required       PT Short Term Goals - 07/05/19 1613      PT SHORT TERM GOAL #1   Title  Patient will be independent in home exercise program to improve strength/mobility for better functional independence with ADLs.    Baseline  1/4: HEP to be given next session    Time  2    Period  Weeks    Status  New    Target Date  07/19/19        PT Long Term Goals - 07/05/19 1724      PT LONG TERM GOAL #1   Title  Patient will increase their FOTO score by 13 points to increase to 53% to demonstrate increased function and mobility for higher quality of life.    Baseline  1/4: 40/100    Time  8    Period  Weeks    Status  New    Target Date  08/30/19      PT LONG TERM GOAL #2   Title  Patient will increase BLE gross strength to 4+/5 as to improve functional strength for independent gait,  increased standing tolerance and increased ADL ability.    Baseline  1/4; R grossly 3+/5 L 4-/5    Time  8    Period  Weeks    Status  New    Target Date  08/30/19      PT LONG TERM GOAL #3   Title  Patient will increase 10 meter walk test to >1.70m/s as to improve gait speed for better community ambulation and to reduce fall risk.    Baseline  1/4: 0.625 m/s with RW    Time  8    Period  Weeks    Status  New    Target Date  08/30/19      PT LONG TERM GOAL #4   Title  Patient will increase Berg Balance score by > 6 points (38/56) to demonstrate decreased fall risk during functional activities.    Baseline  1/4: 32/56    Time  8    Period  Weeks    Status  New    Target Date  08/30/19      PT LONG TERM GOAL #5   Title  Patient will increase ABC scale score >60% to demonstrate better functional mobility and better confidence with ADLs.    Baseline  1/4: 26.9%    Time  8    Period  Weeks    Status  New    Target Date  08/30/19            Plan - 08/02/19 1708    Clinical Impression Statement  Patient presents to physical therapy with fatigue. She is able to participate in interventions with encouragement/cueing for task orientation. She became agitated/tearful midsession due to it being the anniversary of her son's death/birthday. Was able to complete session with good motivation by end of session. She will benefit from skilled physical  therapy to increase mobility, stability, and strength for decreased falls risk and improved quality of life    Personal Factors and Comorbidities  Age;Comorbidity 3+    Comorbidities  PMH of stroke, multiple fx's of lower legs, arthritis, asthma, a fib, CHF, depression, panic attacks, DM, GERD, HLD, HTN, neuropathy, osteoporosis, thyroid disease.    Examination-Activity Limitations  Bathing;Caring for Others;Carry;Dressing;Lift;Locomotion Level;Squat;Stairs;Stand;Toileting;Transfers    Examination-Participation Restrictions  Cleaning;Community  Activity;Driving;Interpersonal Relationship;Meal Prep;Laundry;Shop;Volunteer;Yard Work    Conservation officer, historic buildings  Evolving/Moderate complexity    Rehab Potential  Fair    PT Frequency  2x / week    PT Duration  8 weeks    PT Treatment/Interventions  ADLs/Self Care Home Management;Aquatic Therapy;Cryotherapy;Electrical Stimulation;Moist Heat;Traction;Ultrasound;DME Instruction;Gait training;Stair training;Functional mobility training;Cognitive remediation;Neuromuscular re-education;Balance training;Therapeutic exercise;Therapeutic activities;Patient/family education;Manual techniques;Dry needling;Passive range of motion;Energy conservation;Taping;Vestibular    PT Next Visit Plan  unstable surfaces, LE strength    PT Home Exercise Plan  give next session    Consulted and Agree with Plan of Care  Patient;Family member/caregiver    Family Member Consulted  husband       Patient will benefit from skilled therapeutic intervention in order to improve the following deficits and impairments:  Abnormal gait, Decreased activity tolerance, Cardiopulmonary status limiting activity, Decreased balance, Decreased cognition, Decreased endurance, Decreased knowledge of precautions, Decreased safety awareness, Decreased mobility, Decreased strength, Difficulty walking, Improper body mechanics, Postural dysfunction, Pain  Visit Diagnosis: Other abnormalities of gait and mobility  Unsteadiness on feet  Muscle weakness (generalized)     Problem List Patient Active Problem List   Diagnosis Date Noted  . Tibia/fibula fracture, left, closed, initial encounter 05/19/2018  . S/P AVR (aortic valve replacement) 02/17/2017  . Diabetes (HCC) 01/29/2017  . Depression 01/29/2017  . Severe aortic stenosis 01/22/2017  . Chronic systolic CHF (congestive heart failure) (HCC) 01/22/2017  . Anemia 01/16/2017  . Acute CHF (congestive heart failure) (HCC) 01/16/2017  . Elevated troponin 01/16/2017  .  Hypokalemia 01/16/2017  . Chest pain 01/15/2017   Precious Bard, PT, DPT   08/02/2019, 5:09 PM  [place] [facility] MAIN [facility] SERVICES [address] [place], [place], [address] Phone: [phone]   Fax:  [phone]  Name: ELANAH OSMANOVIC MRN: [id number] Date of Birth: October 18, 1945

## 2019-08-04 ENCOUNTER — Ambulatory Visit: Payer: Medicare Other

## 2019-08-09 ENCOUNTER — Ambulatory Visit: Payer: Medicare Other

## 2019-08-09 ENCOUNTER — Other Ambulatory Visit: Payer: Self-pay

## 2019-08-09 DIAGNOSIS — R2689 Other abnormalities of gait and mobility: Secondary | ICD-10-CM

## 2019-08-09 DIAGNOSIS — M6281 Muscle weakness (generalized): Secondary | ICD-10-CM

## 2019-08-09 DIAGNOSIS — R2681 Unsteadiness on feet: Secondary | ICD-10-CM

## 2019-08-09 NOTE — Therapy (Signed)
[place] [facility] MAIN [facility] SERVICES [address] [place], [place], [address] Phone: [phone]   Fax:  [phone]  Physical Therapy Treatment  Patient Details  Name: Jenny [name] MRN: [id number] Date of Birth: 06-04-1946 Referring Provider (PT): Theora Master   Encounter Date: 08/09/2019  PT End of Session - 08/09/19 1425    Visit Number  6    Number of Visits  16    Date for PT Re-Evaluation  08/30/19    Authorization Type  6/10 eval 07/05/19    PT Start Time  1429    PT Stop Time  1514    PT Time Calculation (min)  45 min    Equipment Utilized During Treatment  Gait belt    Activity Tolerance  Patient tolerated treatment well    Behavior During Therapy  [facility] for tasks assessed/performed;Anxious       Past Medical History:  Diagnosis Date  . Anxiety   . Arthritis   . CAD (coronary artery disease)    a. 01/2017 Cath: nonobs dzs.  . Chronic systolic CHF (congestive heart failure) (HCC)    a. TTE 12/2016, EF 45-50%, probable hypokinesis of the mid apical anteroseptal, anterior, & apical myocardium, GR2DD, possibly bicuspid aortic valve that was moderately thickened w/ severely calcified leaflets, severe aortic stenosis w/ mean gradient 47 mmHg, valve area 0.52 cm, mild MR, mildly dilated LA  . Depression   . Diabetes mellitus with complication (HCC)    Tylenol  . Hyperlipidemia   . Hypothyroidism   . IBS (irritable bowel syndrome)   . Iron deficiency anemia    a. s/p pRBC x1 in 12/2016  . Microcytic anemia   . Osteoporosis   . Panic attacks   . Severe aortic stenosis    a. TTE 12/2016: possibly bicuspid aortic valve that was moderately thickened with severely calcified leaflets. There was severe aortic stenosis with a mean gradient of 47 mmHg and valve area of 0.52 cm; b. 01/2017 s/p AVR w/ 19 mm bioprosthetic Magna Ease pericardial tissue valve, ser# [id number].  . Stroke (HCC) 2001  . TIA (transient ischemic attack) 2006    Past  Surgical History:  Procedure Laterality Date  . ABDOMINAL HYSTERECTOMY    . AORTIC VALVE REPLACEMENT N/A 02/17/2017   Procedure: AORTIC VALVE REPLACEMENT (AVR);  Surgeon: Donata [place], Theron Arista, MD;  Location: [facility] OR;  Service: Open Heart Surgery;  Laterality: N/A;  . CATARACT EXTRACTION W/PHACO Right 04/09/2016   Procedure: CATARACT EXTRACTION PHACO AND INTRAOCULAR LENS PLACEMENT (IOC);  Surgeon: Galen Manila, MD;  Location: [facility];  Service: Ophthalmology;  Laterality: Right;  [place] 57.2 AP% 18.6 CDE 10.64 Fluid Pack lot # [id number]  . CATARACT EXTRACTION W/PHACO Left 12/29/2018   Procedure: CATARACT EXTRACTION PHACO AND INTRAOCULAR LENS PLACEMENT (IOC)  LEFT DIABETIC;  Surgeon: Galen Manila, MD;  Location: [facility];  Service: Ophthalmology;  Laterality: Left;  Diabetic - insuli and oral meds  . CHOLECYSTECTOMY    . COLONOSCOPY    . CORONARY ANGIOGRAPHY N/A 02/03/2017   Procedure: CORONARY ANGIOGRAPHY;  Surgeon: Iran Ouch, MD;  Location: [facility] INV[facility] LAB;  Service: Cardiovascular;  Laterality: N/A;  . FRACTURE SURGERY     LEFT ANKLE  . RIGHT AND LEFT HEART CATH Bilateral 02/03/2017   Procedure: Right and Left Heart Cath;  Surgeon: Iran Ouch, MD;  Location: [facility] INV[facility] LAB;  Service: Cardiovascular;  Laterality: Bilateral;  . TEE WITHOUT CARDIOVERSION N/A 02/17/2017   Procedure: TRANSESOPHAGEAL  ECHOCARDIOGRAM (TEE);  Surgeon: Donata Clay, Theron Arista, MD;  Location: [facility] OR;  Service: Open Heart Surgery;  Laterality: N/A;  . TIBIA IM NAIL INSERTION Left 05/19/2018   Procedure: INTRAMEDULLARY (IM) NAIL TIBIAL;  Surgeon: Juanell Fairly, MD;  Location: [facility];  Service: Orthopedics;  Laterality: Left;    There were no vitals filed for this visit.  Subjective Assessment - 08/09/19 1435    Subjective  Patient reports she fell last friday when in her bedroom and tripped against door. Has bruising on R arm and leg, reports no pain from fall. Was able to get up.     Patient is accompained by:  Family member    Pertinent History  Patient is a 74 year old female who presents with new difficulty walking and weakness in legs. Patient and husband report it being about a year. Has started having difficulty after fractures in LE in 2018. Per physician there is a decline in memory as seen in documented previous notes. Patient has had history of traumatic experiences in life (including watching son commit suicide in 40) and does have panic attacks. PMH of stroke, multiple fx's of lower legs, arthritis, asthma, a fib, CHF, depression, panic attacks, DM, GERD, HLD, HTN, neuropathy, osteoporosis, thyroid disease. Patient furniture surfaces at home, walker stays out all the time in case needed. Several small falls in past 6 months.    Limitations  Lifting;Standing;Walking;House hold activities    How long can you sit comfortably?  n/a    How long can you stand comfortably?  very few minutes    How long can you walk comfortably?  can walk across room    Patient Stated Goals  to get out of using a chair    Currently in Pain?  No/denies       Nustep Lvl 4 RPM> 60; 4 minutes  for cardiovascular support, max cueing for task orientation.     Standing with CGA:  TRX: -mini squats with chair support 10x ; max cueing for task orientation    In // bars: CGA with close guarding/min A for stabilization throughout standing interventions.    Standing balloon taps reaching inside/outside BOS with two near LOB requiring CGA/MinA for stabilization x 2 minutes  airex pad: one foot on airex pad one foot on 6" step; static hold 30 seconds each LE; min A to maintain COM  airex pad: horizontal head turns. 60 seconds     Seated:  RTB df 12x each LE  RTB inversion 12x each LE .     RTB abduction 15x ; max cueing for task orientation,   RTB marching with no back support 15x each LE; second set without UE support for core/trunk activation 15x each LE   RTB hamstring curl 15x  each LE  Seated ball squeeze 5 second holds adduction 15x    Seated adduction ball squeeze between feet with LAQ 15x  Modified windmill 12x   Green swiss ball TrA activation 10x against hands and knees    patient is very verbose and requires max task orientation due to tangential nature.      Pt educated throughout session about proper posture and technique with exercises. Improved exercise technique, movement at target joints, use of target muscles after min to mod verbal, visual, tactile cues.                         PT Education - 08/09/19 1425    Education Details  exercise technique, body mechanics    Person(s) Educated  Patient    Methods  Explanation;Demonstration;Tactile cues;Verbal cues    Comprehension  Verbalized understanding;Returned demonstration;Verbal cues required;Tactile cues required       PT Short Term Goals - 07/05/19 1613      PT SHORT TERM GOAL #1   Title  Patient will be independent in home exercise program to improve strength/mobility for better functional independence with ADLs.    Baseline  1/4: HEP to be given next session    Time  2    Period  Weeks    Status  New    Target Date  07/19/19        PT Long Term Goals - 07/05/19 1724      PT LONG TERM GOAL #1   Title  Patient will increase their FOTO score by 13 points to increase to 53% to demonstrate increased function and mobility for higher quality of life.    Baseline  1/4: 40/100    Time  8    Period  Weeks    Status  New    Target Date  08/30/19      PT LONG TERM GOAL #2   Title  Patient will increase BLE gross strength to 4+/5 as to improve functional strength for independent gait, increased standing tolerance and increased ADL ability.    Baseline  1/4; R grossly 3+/5 L 4-/5    Time  8    Period  Weeks    Status  New    Target Date  08/30/19      PT LONG TERM GOAL #3   Title  Patient will increase 10 meter walk test to >1.59m/s as to improve gait speed for  better community ambulation and to reduce fall risk.    Baseline  1/4: 0.625 m/s with RW    Time  8    Period  Weeks    Status  New    Target Date  08/30/19      PT LONG TERM GOAL #4   Title  Patient will increase Berg Balance score by > 6 points (38/56) to demonstrate decreased fall risk during functional activities.    Baseline  1/4: 32/56    Time  8    Period  Weeks    Status  New    Target Date  08/30/19      PT LONG TERM GOAL #5   Title  Patient will increase ABC scale score >60% to demonstrate better functional mobility and better confidence with ADLs.    Baseline  1/4: 26.9%    Time  8    Period  Weeks    Status  New    Target Date  08/30/19            Plan - 08/09/19 1515    Clinical Impression Statement  Patient presents with occasional episodes of R knee pain limiting session. Patient is very tangential requiring max task orientation cueing. Strengthening in non weightbearing positioned allowed for patient to perform tasks without pain. She will benefit from skilled physical therapy to increase mobility, stability, and strength for decreased falls risk and improved quality of life    Personal Factors and Comorbidities  Age;Comorbidity 3+    Comorbidities  PMH of stroke, multiple fx's of lower legs, arthritis, asthma, a fib, CHF, depression, panic attacks, DM, GERD, HLD, HTN, neuropathy, osteoporosis, thyroid disease.    Examination-Activity Limitations  Bathing;Caring for Others;Carry;Dressing;Lift;Locomotion Level;Squat;Stairs;Stand;Toileting;Transfers    Examination-Participation  Restrictions  Cleaning;Community Activity;Driving;Interpersonal Relationship;Meal Prep;Laundry;Shop;Volunteer;Yard Work    Conservation officer, historic buildings  Evolving/Moderate complexity    Rehab Potential  Fair    PT Frequency  2x / week    PT Duration  8 weeks    PT Treatment/Interventions  ADLs/Self Care Home Management;Aquatic Therapy;Cryotherapy;Electrical Stimulation;Moist  Heat;Traction;Ultrasound;DME Instruction;Gait training;Stair training;Functional mobility training;Cognitive remediation;Neuromuscular re-education;Balance training;Therapeutic exercise;Therapeutic activities;Patient/family education;Manual techniques;Dry needling;Passive range of motion;Energy conservation;Taping;Vestibular    PT Next Visit Plan  unstable surfaces, LE strength    PT Home Exercise Plan  give next session    Consulted and Agree with Plan of Care  Patient;Family member/caregiver    Family Member Consulted  husband       Patient will benefit from skilled therapeutic intervention in order to improve the following deficits and impairments:  Abnormal gait, Decreased activity tolerance, Cardiopulmonary status limiting activity, Decreased balance, Decreased cognition, Decreased endurance, Decreased knowledge of precautions, Decreased safety awareness, Decreased mobility, Decreased strength, Difficulty walking, Improper body mechanics, Postural dysfunction, Pain  Visit Diagnosis: Other abnormalities of gait and mobility  Unsteadiness on feet  Muscle weakness (generalized)     Problem List Patient Active Problem List   Diagnosis Date Noted  . Tibia/fibula fracture, left, closed, initial encounter 05/19/2018  . S/P AVR (aortic valve replacement) 02/17/2017  . Diabetes (HCC) 01/29/2017  . Depression 01/29/2017  . Severe aortic stenosis 01/22/2017  . Chronic systolic CHF (congestive heart failure) (HCC) 01/22/2017  . Anemia 01/16/2017  . Acute CHF (congestive heart failure) (HCC) 01/16/2017  . Elevated troponin 01/16/2017  . Hypokalemia 01/16/2017  . Chest pain 01/15/2017   Precious Bard, PT, DPT    08/09/2019, 4:53 PM  [facility] MAIN [facility] SERVICES [address] [place], [place], [address] Phone: [phone]   Fax:  [phone]  Name: Jenny [name] MRN: [id number] Date of Birth: 03-Sep-1945

## 2019-08-11 ENCOUNTER — Other Ambulatory Visit: Payer: Self-pay

## 2019-08-11 ENCOUNTER — Ambulatory Visit: Payer: Medicare Other

## 2019-08-11 DIAGNOSIS — R2681 Unsteadiness on feet: Secondary | ICD-10-CM | POA: Diagnosis not present

## 2019-08-11 DIAGNOSIS — R2689 Other abnormalities of gait and mobility: Secondary | ICD-10-CM

## 2019-08-11 DIAGNOSIS — M6281 Muscle weakness (generalized): Secondary | ICD-10-CM | POA: Diagnosis not present

## 2019-08-11 NOTE — Therapy (Signed)
[facility] MAIN [facility] SERVICES [address] [place], [place], [address] Phone: [phone]   Fax:  [phone]  Physical Therapy Treatment  Patient Details  Name: Jenny [name] MRN: [id number] Date of Birth: 01-Dec-1945 Referring Provider (PT): Theora Master   Encounter Date: 08/11/2019  PT End of Session - 08/11/19 1525    Visit Number  7    Number of Visits  16    Date for PT Re-Evaluation  08/30/19    Authorization Type  7/10 eval 07/05/19    PT Start Time  1431    PT Stop Time  1514    PT Time Calculation (min)  43 min    Equipment Utilized During Treatment  Gait belt    Activity Tolerance  Patient tolerated treatment well    Behavior During Therapy  [facility] for tasks assessed/performed;Anxious       Past Medical History:  Diagnosis Date  . Anxiety   . Arthritis   . CAD (coronary artery disease)    a. 01/2017 Cath: nonobs dzs.  . Chronic systolic CHF (congestive heart failure) (HCC)    a. TTE 12/2016, EF 45-50%, probable hypokinesis of the mid apical anteroseptal, anterior, & apical myocardium, GR2DD, possibly bicuspid aortic valve that was moderately thickened w/ severely calcified leaflets, severe aortic stenosis w/ mean gradient 47 mmHg, valve area 0.52 cm, mild MR, mildly dilated LA  . Depression   . Diabetes mellitus with complication (HCC)    Tylenol  . Hyperlipidemia   . Hypothyroidism   . IBS (irritable bowel syndrome)   . Iron deficiency anemia    a. s/p pRBC x1 in 12/2016  . Microcytic anemia   . Osteoporosis   . Panic attacks   . Severe aortic stenosis    a. TTE 12/2016: possibly bicuspid aortic valve that was moderately thickened with severely calcified leaflets. There was severe aortic stenosis with a mean gradient of 47 mmHg and valve area of 0.52 cm; b. 01/2017 s/p AVR w/ 19 mm bioprosthetic Magna Ease pericardial tissue valve, ser# [id number].  . Stroke (HCC) 2001  . TIA (transient ischemic attack) 2006    Past  Surgical History:  Procedure Laterality Date  . ABDOMINAL HYSTERECTOMY    . AORTIC VALVE REPLACEMENT N/A 02/17/2017   Procedure: AORTIC VALVE REPLACEMENT (AVR);  Surgeon: Donata Clay, Theron Arista, MD;  Location: [facility] OR;  Service: Open Heart Surgery;  Laterality: N/A;  . CATARACT EXTRACTION W/PHACO Right 04/09/2016   Procedure: CATARACT EXTRACTION PHACO AND INTRAOCULAR LENS PLACEMENT (IOC);  Surgeon: Galen Manila, MD;  Location: [facility] ORS;  Service: Ophthalmology;  Laterality: Right;  [place] 57.2 AP% 18.6 CDE 10.64 Fluid Pack lot # [id number]  . CATARACT EXTRACTION W/PHACO Left 12/29/2018   Procedure: CATARACT EXTRACTION PHACO AND INTRAOCULAR LENS PLACEMENT (IOC)  LEFT DIABETIC;  Surgeon: Galen Manila, MD;  Location: [facility];  Service: Ophthalmology;  Laterality: Left;  Diabetic - insuli and oral meds  . CHOLECYSTECTOMY    . COLONOSCOPY    . CORONARY ANGIOGRAPHY N/A 02/03/2017   Procedure: CORONARY ANGIOGRAPHY;  Surgeon: Iran Ouch, MD;  Location: [facility] INVASIVE CV LAB;  Service: Cardiovascular;  Laterality: N/A;  . FRACTURE SURGERY     LEFT ANKLE  . RIGHT AND LEFT HEART CATH Bilateral 02/03/2017   Procedure: Right and Left Heart Cath;  Surgeon: Iran Ouch, MD;  Location: [facility] INVASIVE CV LAB;  Service: Cardiovascular;  Laterality: Bilateral;  . TEE WITHOUT CARDIOVERSION N/A 02/17/2017   Procedure: TRANSESOPHAGEAL  ECHOCARDIOGRAM (TEE);  Surgeon: Prescott Gum, Collier Salina, MD;  Location: [place];  Service: Open Heart Surgery;  Laterality: N/A;  . TIBIA IM NAIL INSERTION Left 05/19/2018   Procedure: INTRAMEDULLARY (IM) NAIL TIBIAL;  Surgeon: Thornton Park, MD;  Location: [facility] ORS;  Service: Orthopedics;  Laterality: Left;    There were no vitals filed for this visit.  Subjective Assessment - 08/11/19 1521    Subjective  Patient reports another fall since she was seen last, was coming out of the closet when her husband talked to her and she jerked her head and fell backwards.    Patient  is accompained by:  Family member    Pertinent History  Patient is a 74 year old female who presents with new difficulty walking and weakness in legs. Patient and husband report it being about a year. Has started having difficulty after fractures in LE in 2018. Per physician there is a decline in memory as seen in documented previous notes. Patient has had history of traumatic experiences in life (including watching son commit suicide in 14) and does have panic attacks. PMH of stroke, multiple fx's of lower legs, arthritis, asthma, a fib, CHF, depression, panic attacks, DM, GERD, HLD, HTN, neuropathy, osteoporosis, thyroid disease. Patient furniture surfaces at home, walker stays out all the time in case needed. Several small falls in past 6 months.    Limitations  Lifting;Standing;Walking;House hold activities    How long can you sit comfortably?  n/a    How long can you stand comfortably?  very few minutes    How long can you walk comfortably?  can walk across room    Patient Stated Goals  to get out of using a chair    Currently in Pain?  No/denies           Treatment: Lvl 2 RPM> 60 for cardiovascular support, max cueing for task orientation  Rollator: ambulate in hallway with cues for feet within walker, upright posture, gait mechanics. ~400 ft. Cueing/education on locking/unlocking rollator, sitting and standing with rollator.   Negotiating obstacles with use of rollator, cueing for spatial awareness, keeping rollator close to body, 96 ft with occasional Min A for stability   Standing with rollator, High knee marching with BUE support 15x each LE  Airex pad: one foot on 6" Step SUE support 30 seconds x2 trials each LE  airex pad under feet, sit to stand SUE support and Min A ; plop with limited eccentric control 10x    Seated:  4lb ankle weights seated: Marching 10x each LE; max cueing for increasing functional ROM.  LAQ 10x each LE ; max cueing for decreasing velocity of  muscle recruitment  GTB hamstring curl 15x each LE; requires use of visual cue for full arc of motion with cues for decreasing velocity to improve muscle activation control       Pt educated throughout session about proper posture and technique with exercises. Improved exercise technique, movement at target joints, use of target muscles after min to mod verbal, visual, tactile cues.              PT Education - 08/11/19 1522    Education Details  patient husband educated on rollator use.    Person(s) Educated  Patient    Methods  Explanation;Demonstration;Tactile cues;Verbal cues    Comprehension  Verbalized understanding;Returned demonstration;Verbal cues required;Tactile cues required       PT Short Term Goals - 07/05/19 1613      PT SHORT  TERM GOAL #1   Title  Patient will be independent in home exercise program to improve strength/mobility for better functional independence with ADLs.    Baseline  1/4: HEP to be given next session    Time  2    Period  Weeks    Status  New    Target Date  07/19/19        PT Long Term Goals - 07/05/19 1724      PT LONG TERM GOAL #1   Title  Patient will increase their FOTO score by 13 points to increase to 53% to demonstrate increased function and mobility for higher quality of life.    Baseline  1/4: 40/100    Time  8    Period  Weeks    Status  New    Target Date  08/30/19      PT LONG TERM GOAL #2   Title  Patient will increase BLE gross strength to 4+/5 as to improve functional strength for independent gait, increased standing tolerance and increased ADL ability.    Baseline  1/4; R grossly 3+/5 L 4-/5    Time  8    Period  Weeks    Status  New    Target Date  08/30/19      PT LONG TERM GOAL #3   Title  Patient will increase 10 meter walk test to >1.68m/s as to improve gait speed for better community ambulation and to reduce fall risk.    Baseline  1/4: 0.625 m/s with RW    Time  8    Period  Weeks    Status  New     Target Date  08/30/19      PT LONG TERM GOAL #4   Title  Patient will increase Berg Balance score by > 6 points (38/56) to demonstrate decreased fall risk during functional activities.    Baseline  1/4: 32/56    Time  8    Period  Weeks    Status  New    Target Date  08/30/19      PT LONG TERM GOAL #5   Title  Patient will increase ABC scale score >60% to demonstrate better functional mobility and better confidence with ADLs.    Baseline  1/4: 26.9%    Time  8    Period  Weeks    Status  New    Target Date  08/30/19            Plan - 08/11/19 1533    Clinical Impression Statement  Patient educated on use of rollator with education on locking/unlocking and proper placement of self with rollator. Patient able to ambulate longest duration since initiating therapy with use of rollator with no episodes of instability. Patient's husband educated on what a rollator is and will be shown patient's ambulation with rollator next session for potential use of one at home. She will benefit from skilled physical therapy to increase mobility, stability, and strength for decreased falls risk and improved quality of life    Personal Factors and Comorbidities  Age;Comorbidity 3+    Comorbidities  PMH of stroke, multiple fx's of lower legs, arthritis, asthma, a fib, CHF, depression, panic attacks, DM, GERD, HLD, HTN, neuropathy, osteoporosis, thyroid disease.    Examination-Activity Limitations  Bathing;Caring for Others;Carry;Dressing;Lift;Locomotion Level;Squat;Stairs;Stand;Toileting;Transfers    Examination-Participation Restrictions  Cleaning;Community Activity;Driving;Interpersonal Relationship;Meal Prep;Laundry;Shop;Volunteer;Yard Work    Sports coach  PT Frequency  2x / week    PT Duration  8 weeks    PT Treatment/Interventions  ADLs/Self Care Home Management;Aquatic Therapy;Cryotherapy;Electrical  Stimulation;Moist Heat;Traction;Ultrasound;DME Instruction;Gait training;Stair training;Functional mobility training;Cognitive remediation;Neuromuscular re-education;Balance training;Therapeutic exercise;Therapeutic activities;Patient/family education;Manual techniques;Dry needling;Passive range of motion;Energy conservation;Taping;Vestibular    PT Next Visit Plan  unstable surfaces, LE strength    PT Home Exercise Plan  give next session    Consulted and Agree with Plan of Care  Patient;Family member/caregiver    Family Member Consulted  husband       Patient will benefit from skilled therapeutic intervention in order to improve the following deficits and impairments:  Abnormal gait, Decreased activity tolerance, Cardiopulmonary status limiting activity, Decreased balance, Decreased cognition, Decreased endurance, Decreased knowledge of precautions, Decreased safety awareness, Decreased mobility, Decreased strength, Difficulty walking, Improper body mechanics, Postural dysfunction, Pain  Visit Diagnosis: Other abnormalities of gait and mobility  Unsteadiness on feet  Muscle weakness (generalized)     Problem List Patient Active Problem List   Diagnosis Date Noted  . Tibia/fibula fracture, left, closed, initial encounter 05/19/2018  . S/P AVR (aortic valve replacement) 02/17/2017  . Diabetes (HCC) 01/29/2017  . Depression 01/29/2017  . Severe aortic stenosis 01/22/2017  . Chronic systolic CHF (congestive heart failure) (HCC) 01/22/2017  . Anemia 01/16/2017  . Acute CHF (congestive heart failure) (HCC) 01/16/2017  . Elevated troponin 01/16/2017  . Hypokalemia 01/16/2017  . Chest pain 01/15/2017   Precious Bard, PT, DPT   08/11/2019, 3:35 PM  [place] [facility] MAIN [facility] SERVICES [address] [place], [place], [address] Phone: [phone]   Fax:  [phone]  Name: Jenny [name] MRN: [id number] Date of Birth: [birth year]

## 2019-08-12 DIAGNOSIS — I119 Hypertensive heart disease without heart failure: Secondary | ICD-10-CM | POA: Diagnosis not present

## 2019-08-12 DIAGNOSIS — F411 Generalized anxiety disorder: Secondary | ICD-10-CM | POA: Diagnosis not present

## 2019-08-12 DIAGNOSIS — M818 Other osteoporosis without current pathological fracture: Secondary | ICD-10-CM | POA: Diagnosis not present

## 2019-08-12 DIAGNOSIS — H9319 Tinnitus, unspecified ear: Secondary | ICD-10-CM | POA: Diagnosis not present

## 2019-08-16 ENCOUNTER — Ambulatory Visit: Payer: Medicare Other

## 2019-08-18 ENCOUNTER — Ambulatory Visit: Payer: Medicare Other

## 2019-08-24 ENCOUNTER — Ambulatory Visit: Payer: Medicare Other

## 2019-08-24 ENCOUNTER — Other Ambulatory Visit: Payer: Self-pay

## 2019-08-24 DIAGNOSIS — M6281 Muscle weakness (generalized): Secondary | ICD-10-CM

## 2019-08-24 DIAGNOSIS — R2681 Unsteadiness on feet: Secondary | ICD-10-CM | POA: Diagnosis not present

## 2019-08-24 DIAGNOSIS — R2689 Other abnormalities of gait and mobility: Secondary | ICD-10-CM | POA: Diagnosis not present

## 2019-08-24 NOTE — Therapy (Signed)
[place] MAIN [facility] SERVICES [address] [place], [place], [address] Phone: [phone]   Fax:  [phone]  Physical Therapy Treatment  Patient Details  Name: Jenny [name] MRN: [id number] Date of Birth: 1946-02-03 Referring Provider (PT): Gurney Maxin   Encounter Date: 08/24/2019  PT End of Session - 08/24/19 1607    Visit Number  8    Number of Visits  16    Date for PT Re-Evaluation  08/30/19    Authorization Type  8/10 eval 07/05/19    PT Start Time  1600    PT Stop Time  1646    PT Time Calculation (min)  46 min    Equipment Utilized During Treatment  Gait belt    Activity Tolerance  Patient tolerated treatment well    Behavior During Therapy  [facility] for tasks assessed/performed;Anxious       Past Medical History:  Diagnosis Date  . Anxiety   . Arthritis   . CAD (coronary artery disease)    a. 01/2017 Cath: nonobs dzs.  . Chronic systolic CHF (congestive heart failure) ([place])    a. TTE 12/2016, EF 45-50%, probable hypokinesis of the mid apical anteroseptal, anterior, & apical myocardium, GR2DD, possibly bicuspid aortic valve that was moderately thickened w/ severely calcified leaflets, severe aortic stenosis w/ mean gradient 47 mmHg, valve area 0.52 cm, mild MR, mildly dilated LA  . Depression   . Diabetes mellitus with complication (HCC)    Tylenol  . Hyperlipidemia   . Hypothyroidism   . IBS (irritable bowel syndrome)   . Iron deficiency anemia    a. s/p pRBC x1 in 12/2016  . Microcytic anemia   . Osteoporosis   . Panic attacks   . Severe aortic stenosis    a. TTE 12/2016: possibly bicuspid aortic valve that was moderately thickened with severely calcified leaflets. There was severe aortic stenosis with a mean gradient of 47 mmHg and valve area of 0.52 cm; b. 01/2017 s/p AVR w/ 19 mm bioprosthetic Magna Ease pericardial tissue valve, ser# [id number].  . Stroke ([place]) 2001  . TIA (transient ischemic attack) 2006    Past  Surgical History:  Procedure Laterality Date  . ABDOMINAL HYSTERECTOMY    . AORTIC VALVE REPLACEMENT N/A 02/17/2017   Procedure: AORTIC VALVE REPLACEMENT (AVR);  Surgeon: Prescott Gum, Collier Salina, MD;  Location: [place];  Service: Open Heart Surgery;  Laterality: N/A;  . CATARACT EXTRACTION W/PHACO Right 04/09/2016   Procedure: CATARACT EXTRACTION PHACO AND INTRAOCULAR LENS PLACEMENT (IOC);  Surgeon: Birder Robson, MD;  Location: [facility] ORS;  Service: Ophthalmology;  Laterality: Right;  [place] 57.2 AP% 18.6 CDE 10.64 Fluid Pack lot # [id number]  . CATARACT EXTRACTION W/PHACO Left 12/29/2018   Procedure: CATARACT EXTRACTION PHACO AND INTRAOCULAR LENS PLACEMENT ([place])  LEFT DIABETIC;  Surgeon: Birder Robson, MD;  Location: [place];  Service: Ophthalmology;  Laterality: Left;  Diabetic - insuli and oral meds  . CHOLECYSTECTOMY    . COLONOSCOPY    . CORONARY ANGIOGRAPHY N/A 02/03/2017   Procedure: CORONARY ANGIOGRAPHY;  Surgeon: Wellington Hampshire, MD;  Location: [place] CV LAB;  Service: Cardiovascular;  Laterality: N/A;  . FRACTURE SURGERY     LEFT ANKLE  . RIGHT AND LEFT HEART CATH Bilateral 02/03/2017   Procedure: Right and Left Heart Cath;  Surgeon: Wellington Hampshire, MD;  Location: [place] CV LAB;  Service: Cardiovascular;  Laterality: Bilateral;  . TEE WITHOUT CARDIOVERSION N/A 02/17/2017   Procedure: TRANSESOPHAGEAL  ECHOCARDIOGRAM (TEE);  Surgeon: Donata Clay, Theron Arista, MD;  Location: [facility] OR;  Service: Open Heart Surgery;  Laterality: N/A;  . TIBIA IM NAIL INSERTION Left 05/19/2018   Procedure: INTRAMEDULLARY (IM) NAIL TIBIAL;  Surgeon: Juanell Fairly, MD;  Location: [facility] ORS;  Service: Orthopedics;  Laterality: Left;    There were no vitals filed for this visit.  Subjective Assessment - 08/24/19 1605    Subjective  Patient reports she fell twice last week, has not been to therapy for two weeks. Patient reports she got a rollator but tripped over it in the middle of the room, has  bruising of L ribs.    Patient is accompained by:  Family member    Pertinent History  Patient is a 74 year old female who presents with new difficulty walking and weakness in legs. Patient and husband report it being about a year. Has started having difficulty after fractures in LE in 2018. Per physician there is a decline in memory as seen in documented previous notes. Patient has had history of traumatic experiences in life (including watching son commit suicide in 74) and does have panic attacks. PMH of stroke, multiple fx's of lower legs, arthritis, asthma, a fib, CHF, depression, panic attacks, DM, GERD, HLD, HTN, neuropathy, osteoporosis, thyroid disease. Patient furniture surfaces at home, walker stays out all the time in case needed. Several small falls in past 6 months.    Limitations  Lifting;Standing;Walking;House hold activities    How long can you sit comfortably?  n/a    How long can you stand comfortably?  very few minutes    How long can you walk comfortably?  can walk across room    Patient Stated Goals  to get out of using a chair    Currently in Pain?  Yes    Pain Score  5     Pain Location  Rib cage    Pain Orientation  Left    Pain Descriptors / Indicators  Aching;Throbbing    Pain Type  Acute pain    Pain Radiating Towards  up and down left side    Pain Onset  1 to 4 weeks ago    Pain Frequency  Constant    Aggravating Factors   twisting, weightbearing         Treatment: Lvl 2 RPM> 60 for cardiovascular support, max cueing for task orientation   Rollator: ambulate in hallway with cues for feet within walker, upright posture, gait mechanics. ~350 ft. Cueing/education on locking/unlocking rollator, sitting and standing with rollator.    Negotiating obstacles with use of rollator, cueing for spatial awareness, keeping rollator close to body, 96 ft with occasional Min A for stability   In // bars: CGA  Airex pad: one foot on 6" Step SUE support 30 seconds x2 trials  each LE   airex pad horizontal head turns 60 seconds, x2 trials, vertical head turns 60 seconds x 2 trials  airex pad: 2.5# bar rows 15x, straight arm raise 15x frequent posterior LOB, max task orientation cueing  Red light green light for sudden initiation/termination of movement with reduction of UE support from bilateral to unilateral 4x length, 2x going forward, 2x going backwards.    Seated: 4in step under feet for proper body mechanics  Superset: performed 3x Exercise 1: static upright posture sitting with arms crossed feet flat, core contraction; hold 30 seconds Exercise 2: adduction ball squeeze 10x 3 second holds   RTB marches with upright posture; requires use of  visual cue for full arc of motion with cues for decreasing velocity to improve muscle activation control        Pt educated throughout session about proper posture and technique with exercises. Improved exercise technique, movement at target joints, use of target muscles after min to mod verbal, visual, tactile cues.   Patient requires multiple cues throughout session for task orientation and sequencing due to tangential and verbose nature of patient.                     PT Education - 08/24/19 1607    Education Details  locking/unlocking of rollator    Person(s) Educated  Spouse    Methods  Explanation;Demonstration;Tactile cues;Verbal cues    Comprehension  Verbalized understanding;Returned demonstration;Verbal cues required;Tactile cues required       PT Short Term Goals - 07/05/19 1613      PT SHORT TERM GOAL #1   Title  Patient will be independent in home exercise program to improve strength/mobility for better functional independence with ADLs.    Baseline  1/4: HEP to be given next session    Time  2    Period  Weeks    Status  New    Target Date  07/19/19        PT Long Term Goals - 07/05/19 1724      PT LONG TERM GOAL #1   Title  Patient will increase their FOTO score by 13  points to increase to 53% to demonstrate increased function and mobility for higher quality of life.    Baseline  1/4: 40/100    Time  8    Period  Weeks    Status  New    Target Date  08/30/19      PT LONG TERM GOAL #2   Title  Patient will increase BLE gross strength to 4+/5 as to improve functional strength for independent gait, increased standing tolerance and increased ADL ability.    Baseline  1/4; R grossly 3+/5 L 4-/5    Time  8    Period  Weeks    Status  New    Target Date  08/30/19      PT LONG TERM GOAL #3   Title  Patient will increase 10 meter walk test to >1.18m/s as to improve gait speed for better community ambulation and to reduce fall risk.    Baseline  1/4: 0.625 m/s with RW    Time  8    Period  Weeks    Status  New    Target Date  08/30/19      PT LONG TERM GOAL #4   Title  Patient will increase Berg Balance score by > 6 points (38/56) to demonstrate decreased fall risk during functional activities.    Baseline  1/4: 32/56    Time  8    Period  Weeks    Status  New    Target Date  08/30/19      PT LONG TERM GOAL #5   Title  Patient will increase ABC scale score >60% to demonstrate better functional mobility and better confidence with ADLs.    Baseline  1/4: 26.9%    Time  8    Period  Weeks    Status  New    Target Date  08/30/19            Plan - 08/24/19 1656    Clinical Impression Statement  Patient and patient's  husband educated on proper use of rollator, locking and unlocking rollator, and safety awareness with transfers; verbalized understanding. Stability with static standing and head turns is very challenging with frequent posterior LOB resulting in multiple near LOB requiring PT assistance to retain COM. She will benefit from skilled physical therapy to increase mobility, stability, and strength for decreased falls risk and improved quality of life    Personal Factors and Comorbidities  Age;Comorbidity 3+    Comorbidities  PMH of stroke,  multiple fx's of lower legs, arthritis, asthma, a fib, CHF, depression, panic attacks, DM, GERD, HLD, HTN, neuropathy, osteoporosis, thyroid disease.    Examination-Activity Limitations  Bathing;Caring for Others;Carry;Dressing;Lift;Locomotion Level;Squat;Stairs;Stand;Toileting;Transfers    Examination-Participation Restrictions  Cleaning;Community Activity;Driving;Interpersonal Relationship;Meal Prep;Laundry;Shop;Volunteer;Yard Work    Conservation officer, historic buildings  Evolving/Moderate complexity    Rehab Potential  Fair    PT Frequency  2x / week    PT Duration  8 weeks    PT Treatment/Interventions  ADLs/Self Care Home Management;Aquatic Therapy;Cryotherapy;Electrical Stimulation;Moist Heat;Traction;Ultrasound;DME Instruction;Gait training;Stair training;Functional mobility training;Cognitive remediation;Neuromuscular re-education;Balance training;Therapeutic exercise;Therapeutic activities;Patient/family education;Manual techniques;Dry needling;Passive range of motion;Energy conservation;Taping;Vestibular    PT Next Visit Plan  unstable surfaces, LE strength    PT Home Exercise Plan  give next session    Consulted and Agree with Plan of Care  Patient;Family member/caregiver    Family Member Consulted  husband       Patient will benefit from skilled therapeutic intervention in order to improve the following deficits and impairments:  Abnormal gait, Decreased activity tolerance, Cardiopulmonary status limiting activity, Decreased balance, Decreased cognition, Decreased endurance, Decreased knowledge of precautions, Decreased safety awareness, Decreased mobility, Decreased strength, Difficulty walking, Improper body mechanics, Postural dysfunction, Pain  Visit Diagnosis: Other abnormalities of gait and mobility  Unsteadiness on feet  Muscle weakness (generalized)     Problem List Patient Active Problem List   Diagnosis Date Noted  . Tibia/fibula fracture, left, closed, initial  encounter 05/19/2018  . S/P AVR (aortic valve replacement) 02/17/2017  . Diabetes (HCC) 01/29/2017  . Depression 01/29/2017  . Severe aortic stenosis 01/22/2017  . Chronic systolic CHF (congestive heart failure) (HCC) 01/22/2017  . Anemia 01/16/2017  . Acute CHF (congestive heart failure) (HCC) 01/16/2017  . Elevated troponin 01/16/2017  . Hypokalemia 01/16/2017  . Chest pain 01/15/2017   Precious Bard, PT, DPT    08/24/2019, 5:00 PM  [place] [facility] MAIN [facility] SERVICES [address] [place], [place], [address] Phone: [phone]   Fax:  [phone]  Name: Jenny [name] MRN: [id number] Date of Birth: 13-Jul-1945

## 2019-08-31 ENCOUNTER — Ambulatory Visit: Payer: Medicare Other

## 2019-09-02 ENCOUNTER — Other Ambulatory Visit: Payer: Self-pay

## 2019-09-02 ENCOUNTER — Ambulatory Visit (INDEPENDENT_AMBULATORY_CARE_PROVIDER_SITE_OTHER): Payer: Medicare Other | Admitting: Neurology

## 2019-09-02 ENCOUNTER — Encounter: Payer: Self-pay | Admitting: Neurology

## 2019-09-02 VITALS — BP 132/74 | HR 81 | Ht 59.5 in | Wt 147.0 lb

## 2019-09-02 DIAGNOSIS — F419 Anxiety disorder, unspecified: Secondary | ICD-10-CM

## 2019-09-02 DIAGNOSIS — F32A Depression, unspecified: Secondary | ICD-10-CM

## 2019-09-02 DIAGNOSIS — F329 Major depressive disorder, single episode, unspecified: Secondary | ICD-10-CM | POA: Diagnosis not present

## 2019-09-02 DIAGNOSIS — R413 Other amnesia: Secondary | ICD-10-CM

## 2019-09-02 DIAGNOSIS — R410 Disorientation, unspecified: Secondary | ICD-10-CM | POA: Diagnosis not present
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
## 2019-09-02 NOTE — Patient Instructions (Signed)
You have complaints of memory loss and intermittent confusion: memory loss or changes in cognitive function can have many reasons and does not always mean you have dementia. Conditions that can contribute to subjective or objective memory loss include: depression, stress, poor sleep from insomnia or sleep apnea, dehydration, fluctuation in blood sugar values, thyroid or electrolyte dysfunction and certain vitamin deficiencies. Dementia can be caused by stroke, brain atherosclerosis or brain vascular disease due to vascular risk factors (smoking, high blood pressure, high cholesterol, obesity and uncontrolled diabetes), certain degenerative brain disorders (including Parkinson's disease and Multiple sclerosis) and by Alzheimer's disease or other, more rare and sometimes hereditary causes. We will do some additional testing:   We will do an EEG (brainwave test), which we will schedule. We will call you with the results. We will request a formal cognitive test called neuropsychological evaluation which is done by a licensed neuropsychologist. We will make a referral in that regard and their office will call to schedule you for an appointment. Please be reminded, that this is a lengthy appointment.   Please try to hydrate better with water, 6 to 8 cups of water per day are recommended, 8 ounce each, please reduce your diet soda intake and limit yourself to 1-2 servings per day if possible.  Try to rest well at night, 7 to 8 hours of sleep are recommended.  I agree with the referral to psychiatry.  Some of your sluggishness in thinking and confusion may be related to medication effect, from taking higher dose sertraline, taking hydroxyzine, also taking Xanax.  Please use your walker at all times.

## 2019-09-07 ENCOUNTER — Encounter: Payer: Self-pay | Admitting: Counselor

## 2019-09-07 ENCOUNTER — Ambulatory Visit: Payer: Medicare Other | Attending: Neurology

## 2019-09-07 ENCOUNTER — Other Ambulatory Visit: Payer: Self-pay

## 2019-09-07 DIAGNOSIS — R2681 Unsteadiness on feet: Secondary | ICD-10-CM

## 2019-09-07 DIAGNOSIS — M6281 Muscle weakness (generalized): Secondary | ICD-10-CM | POA: Diagnosis not present

## 2019-09-07 DIAGNOSIS — R2689 Other abnormalities of gait and mobility: Secondary | ICD-10-CM | POA: Insufficient documentation

## 2019-09-07 NOTE — Therapy (Signed)
[place] MAIN [facility] SERVICES [address], [place], [address] Phone: [phone]   Fax:  [phone]  Physical Therapy Treatment/ Discharge  Patient Details  Name: Jenny [name] MRN: [id number] Date of Birth: March 23, 1946 Referring Provider (PT): Gurney Maxin   Encounter Date: 09/07/2019  PT End of Session - 09/07/19 1616    Visit Number  9    Number of Visits  16    Date for PT Re-Evaluation  09/07/19    Authorization Type  9/10 eval 07/05/19    PT Start Time  1330    PT Stop Time  1411    PT Time Calculation (min)  41 min    Equipment Utilized During Treatment  Gait belt    Activity Tolerance  Patient tolerated treatment well    Behavior During Therapy  [facility] for tasks assessed/performed       Past Medical History:  Diagnosis Date  . Anxiety   . Arthritis   . CAD (coronary artery disease)    a. 01/2017 Cath: nonobs dzs.  . Chronic systolic CHF (congestive heart failure) ([place])    a. TTE 12/2016, EF 45-50%, probable hypokinesis of the mid apical anteroseptal, anterior, & apical myocardium, GR2DD, possibly bicuspid aortic valve that was moderately thickened w/ severely calcified leaflets, severe aortic stenosis w/ mean gradient 47 mmHg, valve area 0.52 cm, mild MR, mildly dilated LA  . Depression   . Diabetes mellitus with complication (HCC)    Tylenol  . Hyperlipidemia   . Hypothyroidism   . IBS (irritable bowel syndrome)   . Iron deficiency anemia    a. s/p pRBC x1 in 12/2016  . Microcytic anemia   . Osteoporosis   . Panic attacks   . Severe aortic stenosis    a. TTE 12/2016: possibly bicuspid aortic valve that was moderately thickened with severely calcified leaflets. There was severe aortic stenosis with a mean gradient of 47 mmHg and valve area of 0.52 cm; b. 01/2017 s/p AVR w/ 19 mm bioprosthetic Magna Ease pericardial tissue valve, ser# [id number].  . Stroke ([place]) 2001  . TIA (transient ischemic attack) 2006    Past  Surgical History:  Procedure Laterality Date  . ABDOMINAL HYSTERECTOMY    . AORTIC VALVE REPLACEMENT N/A 02/17/2017   Procedure: AORTIC VALVE REPLACEMENT (AVR);  Surgeon: Prescott Gum, Collier Salina, MD;  Location: [place];  Service: Open Heart Surgery;  Laterality: N/A;  . CATARACT EXTRACTION W/PHACO Right 04/09/2016   Procedure: CATARACT EXTRACTION PHACO AND INTRAOCULAR LENS PLACEMENT (IOC);  Surgeon: Birder Robson, MD;  Location: [facility];  Service: Ophthalmology;  Laterality: Right;  [place] 57.2 AP% 18.6 CDE 10.64 Fluid Pack lot # [id number]  . CATARACT EXTRACTION W/PHACO Left 12/29/2018   Procedure: CATARACT EXTRACTION PHACO AND INTRAOCULAR LENS PLACEMENT ([place])  LEFT DIABETIC;  Surgeon: Birder Robson, MD;  Location: [place];  Service: Ophthalmology;  Laterality: Left;  Diabetic - insuli and oral meds  . CHOLECYSTECTOMY    . COLONOSCOPY    . CORONARY ANGIOGRAPHY N/A 02/03/2017   Procedure: CORONARY ANGIOGRAPHY;  Surgeon: Wellington Hampshire, MD;  Location: [place] CV LAB;  Service: Cardiovascular;  Laterality: N/A;  . FRACTURE SURGERY     LEFT ANKLE  . RIGHT AND LEFT HEART CATH Bilateral 02/03/2017   Procedure: Right and Left Heart Cath;  Surgeon: Wellington Hampshire, MD;  Location: [place] CV LAB;  Service: Cardiovascular;  Laterality: Bilateral;  . TEE WITHOUT CARDIOVERSION N/A 02/17/2017   Procedure:  TRANSESOPHAGEAL ECHOCARDIOGRAM (TEE);  Surgeon: Prescott Gum, Collier Salina, MD;  Location: [place];  Service: Open Heart Surgery;  Laterality: N/A;  . TIBIA IM NAIL INSERTION Left 05/19/2018   Procedure: INTRAMEDULLARY (IM) NAIL TIBIAL;  Surgeon: Thornton Park, MD;  Location: [facility];  Service: Orthopedics;  Laterality: Left;    There were no vitals filed for this visit.  Subjective Assessment - 09/07/19 1614    Subjective  Patient missed two weeks again after multiple doctor appointments and personal life problems. Is using her rollator now and is happy with HEP and ready to discharge.     Patient is accompained by:  Family member    Pertinent History  Patient is a 74 year old female who presents with new difficulty walking and weakness in legs. Patient and husband report it being about a year. Has started having difficulty after fractures in LE in 2018. Per physician there is a decline in memory as seen in documented previous notes. Patient has had history of traumatic experiences in life (including watching son commit suicide in 54) and does have panic attacks. PMH of stroke, multiple fx's of lower legs, arthritis, asthma, a fib, CHF, depression, panic attacks, DM, GERD, HLD, HTN, neuropathy, osteoporosis, thyroid disease. Patient furniture surfaces at home, walker stays out all the time in case needed. Several small falls in past 6 months.    Limitations  Lifting;Standing;Walking;House hold activities    How long can you sit comfortably?  n/a    How long can you stand comfortably?  very few minutes    How long can you walk comfortably?  can walk across room    Patient Stated Goals  to get out of using a chair    Currently in Pain?  No/denies          Goals: FOTO: 65/100 : MET  BLE strength 4+/5 MET 10 MWT=1.38 m/s with rollator MET  BERG: 43/56 MET ABC : 56.3% measurable progress   6 minute walk test : 1002 ft with rollator   Education on locking/unlocking braces of rollator   Patient has met 90% of goals at this time and made significant progress towards ABC goal. Due to her multitude of appointments and personal life situation in combination to her progress towards/meeting of goals patient is agreeable to discharge at this time. Due to missing two weeks this will be a recert for today's discharge visit and a discharge.  I will be happy to treat this patient again in the future as needed.                   PT Education - 09/07/19 1616    Education Details  discharge, goals    Person(s) Educated  Patient;Spouse    Methods  Explanation     Comprehension  Verbalized understanding       PT Short Term Goals - 09/07/19 1618      PT SHORT TERM GOAL #1   Title  Patient will be independent in home exercise program to improve strength/mobility for better functional independence with ADLs.    Baseline  1/4: HEP to be given next session 3/9: HEP compliant    Time  2    Period  Weeks    Status  Achieved    Target Date  07/19/19        PT Long Term Goals - 09/07/19 1618      PT LONG TERM GOAL #1   Title  Patient will increase their FOTO score  by 13 points to increase to 53% to demonstrate increased function and mobility for higher quality of life.    Baseline  1/4: 40/100 3/9: 65/100    Time  8    Period  Weeks    Status  Achieved      PT LONG TERM GOAL #2   Title  Patient will increase BLE gross strength to 4+/5 as to improve functional strength for independent gait, increased standing tolerance and increased ADL ability.    Baseline  1/4; R grossly 3+/5 L 4-/5 3/9: grossly 4+/5    Time  8    Period  Weeks    Status  Achieved      PT LONG TERM GOAL #3   Title  Patient will increase 10 meter walk test to >1.110ms as to improve gait speed for better community ambulation and to reduce fall risk.    Baseline  1/4: 0.625 m/s with RW 3/9: 1.38 m/s with rollator    Time  8    Period  Weeks    Status  Achieved      PT LONG TERM GOAL #4   Title  Patient will increase Berg Balance score by > 6 points (38/56) to demonstrate decreased fall risk during functional activities.    Baseline  1/4: 32/56 3/9: 43/56    Time  8    Period  Weeks    Status  Achieved      PT LONG TERM GOAL #5   Title  Patient will increase ABC scale score >60% to demonstrate better functional mobility and better confidence with ADLs.    Baseline  1/4: 26.9% 3/9: 56.3%    Time  8    Period  Weeks    Status  Partially Met            Plan - 09/07/19 1617    Clinical Impression Statement  Patient has met 90% of goals at this time and made  significant progress towards ABC goal. Due to her multitude of appointments and personal life situation in combination to her progress towards/meeting of goals patient is agreeable to discharge at this time. Due to missing two weeks this will be a recert for today's discharge visit and a discharge.  I will be happy to treat this patient again in the future as needed.    Personal Factors and Comorbidities  Age;Comorbidity 3+    Comorbidities  PMH of stroke, multiple fx's of lower legs, arthritis, asthma, a fib, CHF, depression, panic attacks, DM, GERD, HLD, HTN, neuropathy, osteoporosis, thyroid disease.    Examination-Activity Limitations  Bathing;Caring for Others;Carry;Dressing;Lift;Locomotion Level;Squat;Stairs;Stand;Toileting;Transfers    Examination-Participation Restrictions  Cleaning;Community Activity;Driving;Interpersonal Relationship;Meal Prep;Laundry;Shop;Volunteer;Yard Work    SMerchant navy officer Evolving/Moderate complexity    Rehab Potential  Fair    PT Frequency  One time visit    PT Duration  8 weeks    PT Treatment/Interventions  ADLs/Self Care Home Management;Aquatic Therapy;Cryotherapy;Electrical Stimulation;Moist Heat;Traction;Ultrasound;DME Instruction;Gait training;Stair training;Functional mobility training;Cognitive remediation;Neuromuscular re-education;Balance training;Therapeutic exercise;Therapeutic activities;Patient/family education;Manual techniques;Dry needling;Passive range of motion;Energy conservation;Taping;Vestibular    PT Next Visit Plan  unstable surfaces, LE strength    PT Home Exercise Plan  give next session    Consulted and Agree with Plan of Care  Patient;Family member/caregiver    Family Member Consulted  husband       Patient will benefit from skilled therapeutic intervention in order to improve the following deficits and impairments:  Abnormal gait, Decreased activity tolerance, Cardiopulmonary status limiting activity, Decreased  balance, Decreased cognition, Decreased endurance, Decreased knowledge of precautions, Decreased safety awareness, Decreased mobility, Decreased strength, Difficulty walking, Improper body mechanics, Postural dysfunction, Pain  Visit Diagnosis: Other abnormalities of gait and mobility - Plan: PT plan of care cert/re-cert  Unsteadiness on feet - Plan: PT plan of care cert/re-cert  Muscle weakness (generalized) - Plan: PT plan of care cert/re-cert     Problem List Patient Active Problem List   Diagnosis Date Noted  . Tibia/fibula fracture, left, closed, initial encounter 05/19/2018  . S/P AVR (aortic valve replacement) 02/17/2017  . Diabetes ([place]) 01/29/2017  . Depression 01/29/2017  . Severe aortic stenosis 01/22/2017  . Chronic systolic CHF (congestive heart failure) ([place]) 01/22/2017  . Anemia 01/16/2017  . Acute CHF (congestive heart failure) ([place]) 01/16/2017  . Elevated troponin 01/16/2017  . Hypokalemia 01/16/2017  . Chest pain 01/15/2017   Janna Arch, PT, DPT   09/07/2019, 4:23 PM  [place] MAIN [facility] SERVICES [address] [place], [place], [address] Phone: [phone]   Fax:  [phone]  Name: Jenny [name] MRN: [id number] Date of Birth: Jul 21, 1945

## 2019-09-09 ENCOUNTER — Ambulatory Visit: Payer: Medicare Other

## 2019-09-14 ENCOUNTER — Ambulatory Visit: Payer: Medicare Other

## 2019-09-14 DIAGNOSIS — M818 Other osteoporosis without current pathological fracture: Secondary | ICD-10-CM | POA: Diagnosis not present

## 2019-09-14 DIAGNOSIS — I119 Hypertensive heart disease without heart failure: Secondary | ICD-10-CM | POA: Diagnosis not present

## 2019-09-14 DIAGNOSIS — R410 Disorientation, unspecified: Secondary | ICD-10-CM | POA: Diagnosis not present

## 2019-09-14 DIAGNOSIS — R011 Cardiac murmur, unspecified: Secondary | ICD-10-CM | POA: Diagnosis not present

## 2019-09-14 DIAGNOSIS — F411 Generalized anxiety disorder: Secondary | ICD-10-CM | POA: Diagnosis not present

## 2019-09-15 DIAGNOSIS — F3340 Major depressive disorder, recurrent, in remission, unspecified: Secondary | ICD-10-CM | POA: Diagnosis not present

## 2019-09-16 ENCOUNTER — Ambulatory Visit: Payer: Medicare Other

## 2019-09-21 ENCOUNTER — Ambulatory Visit: Payer: Medicare Other

## 2019-09-23 ENCOUNTER — Ambulatory Visit: Payer: Medicare Other

## 2019-09-27 ENCOUNTER — Other Ambulatory Visit: Payer: Medicare Other

## 2019-09-28 ENCOUNTER — Ambulatory Visit: Payer: Medicare Other

## 2019-09-29 ENCOUNTER — Ambulatory Visit (INDEPENDENT_AMBULATORY_CARE_PROVIDER_SITE_OTHER): Payer: Medicare Other | Admitting: Neurology

## 2019-09-29 ENCOUNTER — Other Ambulatory Visit: Payer: Self-pay

## 2019-09-29 DIAGNOSIS — R410 Disorientation, unspecified: Secondary | ICD-10-CM

## 2019-09-29 DIAGNOSIS — F419 Anxiety disorder, unspecified: Secondary | ICD-10-CM

## 2019-09-29 DIAGNOSIS — R413 Other amnesia: Secondary | ICD-10-CM

## 2019-09-29 DIAGNOSIS — F329 Major depressive disorder, single episode, unspecified: Secondary | ICD-10-CM

## 2019-10-04 ENCOUNTER — Ambulatory Visit: Payer: Medicare Other

## 2019-10-06 ENCOUNTER — Encounter: Payer: Self-pay | Admitting: Counselor

## 2019-10-06 ENCOUNTER — Encounter: Payer: Medicare Other | Admitting: Counselor

## 2019-10-06 ENCOUNTER — Ambulatory Visit (INDEPENDENT_AMBULATORY_CARE_PROVIDER_SITE_OTHER): Payer: Medicare Other | Admitting: Counselor

## 2019-10-06 ENCOUNTER — Ambulatory Visit: Payer: Medicare Other

## 2019-10-06 ENCOUNTER — Other Ambulatory Visit: Payer: Self-pay

## 2019-10-06 ENCOUNTER — Ambulatory Visit: Payer: Medicare Other | Admitting: Psychology

## 2019-10-06 DIAGNOSIS — F0150 Vascular dementia without behavioral disturbance: Secondary | ICD-10-CM | POA: Diagnosis not present

## 2019-10-06 DIAGNOSIS — R410 Disorientation, unspecified: Secondary | ICD-10-CM

## 2019-10-06 DIAGNOSIS — F09 Unspecified mental disorder due to known physiological condition: Secondary | ICD-10-CM

## 2019-10-06 DIAGNOSIS — F418 Other specified anxiety disorders: Secondary | ICD-10-CM | POA: Diagnosis not present

## 2019-10-06 DIAGNOSIS — F01A0 Vascular dementia, mild, without behavioral disturbance, psychotic disturbance, mood disturbance, and anxiety: Secondary | ICD-10-CM

## 2019-10-06 NOTE — Procedures (Signed)
   HISTORY: 74 year old female, presented with memory loss, intermittent confusion, she is taking Xanax 0.5 mg twice a day, hydroxyzine 25 mg twice a day, melatonin 10 mg at bedtime, Zoloft 100 mg twice a day.  TECHNIQUE:  This is a routine 16 channel EEG recording with one channel devoted to a limited EKG recording.  It was performed during wakefulness, drowsiness and asleep.  Hyperventilation and photic stimulation were performed as activating procedures.  There are minimum muscle and movement artifact noted.  Upon maximum arousal, posterior dominant waking rhythm consistent of rhythmic mixed alpha and theta range activity. Activities are symmetric over the bilateral posterior derivations and attenuated with eye opening.  Hyperventilation produced mild/moderate buildup with higher amplitude and the slower activities noted.  Photic stimulation did not alter the tracing.  During EEG recording, patient developed drowsiness and entered sleep, sleep EEG demonstrated architecture, there were frontal centrally dominant vertex waves and symmetric sleep spindles noted.  During EEG recording, there was no epileptiform discharge noted.  EKG demonstrate sinus rhythm, with heart rate of 68 bpm.  CONCLUSION: This is a  normal awake and asleep EEG.  There is no electrodiagnostic evidence of epileptiform discharge, the increased beta range activity is usually associated with benzodiazepine use.  Levert Feinstein, M.D. Ph.D.  [facility] [address] [place], [place] [address] Phone: [phone] Fax:      [phone]

## 2019-10-06 NOTE — Progress Notes (Signed)
   Psychometrist Note   Cognitive testing was administered to Jenny [name] by Milana Kidney, B.S. (Technician) under the supervision of Alphonzo Severance, Psy.D., ABN. [name] was able to tolerate all test procedures. Dr. Nicole Kindred met with the patient as needed to manage any emotional reactions to the testing procedures (if applicable). Rest breaks were offered.    The battery of tests administered was selected by Dr. Nicole Kindred with consideration to the patient's current level of functioning, the nature of her symptoms, emotional and behavioral responses during the interview, level of literacy, observed level of motivation/effort, and the nature of the referral question. This battery was communicated to the psychometrist. Communication between Dr. Nicole Kindred and the psychometrist was ongoing throughout the evaluation and Dr. Nicole Kindred was immediately accessible at all times. Dr. Nicole Kindred provided supervision to the technician on the date of this service, to the extent necessary to assure the quality of all services provided.    [name] will return in approximately one week for an interactive feedback session with Dr. Nicole Kindred, at which time female test performance, clinical impressions, and treatment recommendations will be reviewed in detail. The patient understands she can contact our office should she require our assistance before this time.   A total of 110 minutes of billable time were spent with Jenny [name] by the technician, including test administration and scoring time. Billing for these services is reflected in Dr. Les Pou note.   This note reflects time spent with the psychometrician and does not include test scores, clinical history, or any interpretations made by Dr. Nicole Kindred. The full report will follow in a separate note.
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
## 2019-10-07 ENCOUNTER — Telehealth: Payer: Self-pay

## 2019-10-07 NOTE — Telephone Encounter (Signed)
I called pt, spoke with pt's husband, Jenny [name], per DPR. I discussed pt's EEG results and recommendations. Pt's husband asked if pt could be evaluated for neuropathy and I advised him that since this is a new problem a referral from PCP should be sent over and then we can schedule her for an appt to discuss neuropathy. Pt's husband verbalized understanding of results and had no further questions.

## 2019-10-07 NOTE — Progress Notes (Signed)
Please call and advise the patient that the EEG or brain wave test we performed was reported as normal in the awake and asleep states. We checked for abnormal electrical discharges in the brain waves and the report suggested normal findings. No further action is required on this test at this time. Please remind patient to keep any upcoming appointments or tests (neuropsychology appt pending for next week) and to call us with any interim questions, concerns, problems or updates. Thanks,  Huston Foley, MD, PhD

## 2019-10-07 NOTE — Telephone Encounter (Signed)
-----   Message from Huston Foley, MD sent at 10/07/2019  7:17 AM EDT ----- Please call and advise the patient that the EEG or brain wave test we performed was reported as normal in the awake and asleep states. We checked for abnormal electrical discharges in the brain waves and the report suggested normal findings. No further action is required on this test at this time. Please remind patient to keep any upcoming appointments or tests (neuropsychology appt pending for next week) and to call us with any interim questions, concerns, problems or updates. Thanks,  Huston Foley, MD, PhD

## 2019-10-11 ENCOUNTER — Ambulatory Visit: Payer: Medicare Other

## 2019-10-11 ENCOUNTER — Encounter: Payer: Medicare Other | Admitting: Counselor

## 2019-10-12 NOTE — Progress Notes (Signed)
NEUROPSYCHOLOGICAL TEST SCORES [facility]  Patient Name: Jenny [name] MRN: [id number] Date of Birth: 1945-09-23 Age: 74 y.o. Education: 12 years  Measurement properties of test scores: IQ, Index, and Standard Scores (SS): Mean = 100; Standard Deviation = 15 Scaled Scores (Ss): Mean = 10; Standard Deviation = 3 Z scores (Z): Mean = 0; Standard Deviation = 1 T scores (T); Mean = 50; Standard Deviation = 10  TEST SCORES:    Note: This summary of test scores accompanies the interpretive report and should not be considered in isolation without reference to the appropriate sections in the text. Test scores are relative to age, gender, and educational history as available and appropriate.   Performance Validity            A Random Letter Test Raw  Descriptor      Errors 0 Within Expectation  The Dot Counting Test: 11 Within Expectation      Mental Status Screening     Total Score Descriptor  MoCA 25 Normal  MMSE        Expected Functioning        Wide Range Achievement Test: Standard/Scaled Score Percentile      Word Reading 93 32      Attention/Processing Speed        Neuropsychological Assessment Battery (Attention Module, Form 1): T-score Percentile      Digits Forward 61 86      Digits Backwards 54 66      Repeatable Battery for the Assessment of Neuropsychological Status (Form A): Scaled Score Percentile      Coding 5 5      Language        Neuropsychological Assessment Battery (Language Module, Form 1): T-score Percentile      Naming 49 46      Verbal Fluency: T-score Percentile      Controlled Oral Word Association (F-A-S) 38 12      Semantic Fluency (Animals) 38 12      Memory:        Neuropsychological Assessment Battery (Memory Module, Form 1): T-score Percentile      List Learning           List A Immediate Recall   (6, 7, 7) 52 58         List B Immediate Recall   (4) 53 62         List A Short Delayed Recall   (5) 45 31         List A  Long Delayed Recall   (5) 47 38         List A Long Delayed Yes/No Recognition Hits   (10) -- 21         List A Long Delayed Yes/No Recognition False Alarms   (3) -- 62         List A Recognition Discriminability Index -- 54     Story Learning           Immediate Recall   (30, 36) 59 82         Delayed Recall   (32) 54 66      Daily Living Memory            Immediate Recall   (17, 16) 42 21          Delayed Recall   (7, 5) 47 38          Recognition Hits -- 27  Repeatable Battery for the Assessment of Neuropsychological Status (Form A): Scaled Score Percentile         Figure Recall   (11) 9 37      Visuospatial/Constructional Functioning        Repeatable Battery for the Assessment of Neuropsychological Status (Form A): Standard/Scaled Score Percentile     Visuospatial/Constructional Index 96 39         Figure Copy 9 37         Judgment of Line Orientation --- 51-75      Executive Functioning        [employer] Test (MWCST): Standard/T-Score Percentile      Number of Categories Correct 33 5      Number of Perseverative Errors 38 12      Number of Total Errors 35 7      Percent Perseverative Errors 43 25  Executive Function Composite 76 6      Trail Making Test: T-Score Percentile      Part A 53 62      Part B 40 16      [place] Diagnostic Aphasia Exam: Raw Score Scaled Score      Complex Ideational Material 10 7      Clock Drawing Raw Score Descriptor      Command 10 Normal      Rating Scales        Quick Dementia Rating System Raw Score Descriptor      Sum of Boxes 4.5 Mild Dementia      Total Score 7 Mild Dementia  Patient Health Questionnaire - 9 11 Moderate Depression  GAD-7 14 Moderate Anxiety   Peter V. Nicole Kindred PsyD, [place] Clinical Neuropsychologist

## 2019-10-13 ENCOUNTER — Encounter: Payer: Medicare Other | Admitting: Counselor

## 2019-10-13 ENCOUNTER — Ambulatory Visit: Payer: Medicare Other

## 2019-10-13 NOTE — Progress Notes (Signed)
NEUROPSYCHOLOGICAL EVALUATION [facility]  Patient Name: Jenny [name] MRN: [id number] Date of Birth: 03/13/1946 Age: 74 y.o. Education: 12 years  Clinical Impressions  Jenny [name] is a 74 y.o., left-hand dominant, married woman with a history of delirium associated with UTI about 1.5 years ago and chronic medical issues including hypothyroidism, systolic heart failure, diabetes, depression and anxiety. She thinks that she is doing fine from a cognitive perspective but her husband is quite concerned. He stated that she has day-to-day memory problems and he has noticed some changes with driving, self-management of medications, and paying her bills. She is not rapidly forgetting information and she does not forget entire events. Interestingly, he stated that her difficulties fluctuate from day-to-day and are not present all the time. On neuropsychological assessment there is suggestion of fairly good performance in most areas albeit with subtle changes on measures of executive function and tasks involving cognitive efficiency, such as timed number-symbol coding and verbal fluency procedures. There does not appear to be any very significant or severe impairment. She did well on measures of attention and working memory and also immediate and delayed recall, with mainly average scores in those areas. Visuospatial and constructional functioning were also well preserved. Her husband rated her as functioning at a mild dementia level day-to-day, although that is significantly lower than I would put her based on his report during the interview. She screened positive for moderate levels of anxiety symptoms and depressive symptoms.   Mr. Hermans is thus demonstrating a mild neurocognitive disorder that may be multifactorial. My sense is that she has some minor chronic mild cognitive impairment and is prone to transient disruptions of her mental status due to underlying medical conditions and/or  medication side effects. I do not see any signs or symptoms particularly concerning for Alzheimer's dementia at this time and think that her husband is overrating her level of functional impairment because she does not appear to have reached a dementia level. Will counsel the patient and her husband about management of cerebrovascular risk factors and following closely with her medical care providers to assure her diabetes and other issues are well controlled.   Diagnostic Impressions: Mild neurocognitive disorder due to multiple etiologies Cerebrovascular disease Medication side effects  Recommendations to be discussed with patient  Your performance and presentation on assessment today were consistent with fairly good performance in most areas. Specifically, you demonstrated normal learning and recall of verbal and visual information, good naming, and that is reassuring regarding the possibility of Alzheimer's disease because those scores often change early in the setting of that condition. You did have some mild diminution on measures of executive function, processing speed, and other tests involving cognitive efficiency. Your diagnosis is thus mild cognitive impairment.   The major difference between mild cognitive impairment (MCI) and dementia is in severity and potential prognosis. Once someone reaches a level of severity adequate to be diagnosed with a dementia, there is usually progression over time, though this may be years. On the other hand, mild cognitive impairment, while a significant risk for dementia in future, does not always progress to dementia, and in some instances stays the same or can even revert to normal. It is important to realize that if MCI is due to underlying Alzheimer's disease, it will most likely progress to dementia eventually. The rate of conversion to Alzheimer's dementia from amnestic MCI is about 15% per year versus the general population risk of conversion of 2% per  year.  As for what is causing your mild cognitive impairment, my best guess is that it is due to a combination of factors including vascular changes in the brain, transient disruptions to blood sugar levels, and potentially also medication side effects. Some individuals with mild cognitive impairment are very susceptible to transient alterations in their mental status in the presence of underlying health conditions (things like diabetes, electrolyte balance, hydration status, and the like can throw you off). I think this is why your husband is noticing fluctuation from day-to-day. Your episode of delirium in the past is supportive of that impression.   Cerebrovascular disease is a major contributor to cognitive decline. Most people are familiar with stroke, which involves acute disruptions in blood flow to the brain that result in damage to underlying brain tissue. Small vessel ischemic disease is less well known but is more common and can be equally problematic. Small vessel ischemic disease involves wear and tear on small blood vessels in the brain over time and increase with age, affecting nearly 100% of people in those older than 72 years. Small vessel ischemic disease increases risk for dementia and has been associated with Alzheimer's dementia in some studies. The best way to minimize cerebrovascular risk is to actively manage risk factors, including hypertension and high cholesterol. Medications can be helpful but maintaining a healthy body weight, getting regular exercise, and eating a heart-healthy diet such as the MIND diet or DASH diet are also crucially important.   This means that one of the most important things you can do to maintain mental status is to closely monitor and treat your underlying health conditions. Be proactive about drinking enough water, getting enough sleep, avoiding brain impairing medications to the extent possible, and eating a healthy diet.   You are on the potentially  brain impairing medication Alzprazolam. There is a cost benefit analysis that must be carefully considered by you and a prescribing provider but you should know that this medication may be contributing to your memory and thinking problems.   I do think it is a good idea for your husband to continue helping you with your insulin given the potential extreme consequences of misadministering that if you are having an off day.   Some cognitive abilities (such as processing speed) naturally decline with age, but there are many things you can do to contribute to healthy cognitive aging. There is evidence from at least one study that a modified low carbohydrate mediterranean diet (the MIND-DASH) diet can contribute to healthy cognitive aging. There is also some evidence to suggest a beneficial effect of coffee (black coffee without sugar or other additives such as cream) for healthy aging. One glass of red wine a day may also contribute to healthy aging though at two drinks you lose all benefit and at three drinks it may be doing more harm than good. Staying active, mentally and physically, are also crucially important. One of the best ways to do this is simply to stay active and engaged in your life, particularly with social activities. Challenging the mind and other cognitively stimulating activities are encouraged, consider learning a new hobby, reconnecting with old friends, reading an interesting and thought provoking book. It is not so much what you do that is important as it is that you enjoy it and stay at it.   Test Findings  Test scores are summarized in additional documentation associated with this encounter. Test scores are relative to age, gender, and educational history as available and appropriate. There  were no concerns about performance validity as all findings fell within normal expectations.   General Intellectual Functioning/Achievement:  Performance on single word reading was in the average  range, which presents as a reasonable standard of comparison for Ms. Giannone' cognitive test data.   Attention and Processing Efficiency: Performance was good on measures of attention and working memory involving digit repetition with a high average score on digit repetition forward and an average score on digit repetition backward.   With respect to processing efficiency, simple numeric sequencing was average but timed number symbol coding was unusually low. The former is a very easy measure.   Language: Performance was normal on the fundamental ability of visual object confrontation naming. By contrast, fluency performance was weak with scores at the bottom of the low average range, straddling the unusually low range in response to phonemic and semantic prompts.  Visuospatial Function: Visuospatial and constructional function was average at an index level. Good average to high average range scores were obtained on figure copy and judgment of angular line orientations.   Learning and Memory: Learning and memory measures showed good ability to acquire and maintain information across time in both the verbal and visual modalities.   In the verbal realm, immediate recall for material including a 12-item word list, short story, and brief daily-living type information was within normal limits with scores clustering around the average range. Delayed recall was average in all cases. Recognition memory for the words from the list and daily-living items was average.   In the visual realm, dleayed recall of a modestly complex figure was average.   Executive Functions: Performance was weak on measures of executive function with an unusually low score on the Executive Function Composite from the [employer], a rule-based categorization procedure emphasisizng flexible thinking and concept formation. She also had weak low average (almost unusually low) generation of words in response  to the letters F-A-S. By contrast, she did fairly well when attending to and then reasoning with verbally presented information on the Complex Ideational Material and on the challenging Trail Making Test B. Clock drawing was normal with good formation of the face, numbers, and correct hand placement.   Rating Scale(s): Ms. Wehrman reported moderate levels of symptoms involving depression and anxiety. Her husband rated her as functioning at a mild dementia level but also indicated on the test form "all the changes are not constant, some days are better or worse than others."   Bettye Boeck. Roseanne Reno PsyD, ABN Clinical Neuropsychologist

## 2019-10-14 ENCOUNTER — Encounter: Payer: Self-pay | Admitting: Counselor

## 2019-10-14 ENCOUNTER — Ambulatory Visit (INDEPENDENT_AMBULATORY_CARE_PROVIDER_SITE_OTHER): Payer: Medicare Other | Admitting: Counselor

## 2019-10-14 ENCOUNTER — Other Ambulatory Visit: Payer: Self-pay

## 2019-10-14 ENCOUNTER — Encounter: Payer: Medicare Other | Admitting: Counselor

## 2019-10-14 DIAGNOSIS — M818 Other osteoporosis without current pathological fracture: Secondary | ICD-10-CM | POA: Diagnosis not present

## 2019-10-14 DIAGNOSIS — I119 Hypertensive heart disease without heart failure: Secondary | ICD-10-CM | POA: Diagnosis not present

## 2019-10-14 DIAGNOSIS — E119 Type 2 diabetes mellitus without complications: Secondary | ICD-10-CM | POA: Diagnosis not present

## 2019-10-14 DIAGNOSIS — F0150 Vascular dementia without behavioral disturbance: Secondary | ICD-10-CM

## 2019-10-14 DIAGNOSIS — F01A0 Vascular dementia, mild, without behavioral disturbance, psychotic disturbance, mood disturbance, and anxiety: Secondary | ICD-10-CM

## 2019-10-14 DIAGNOSIS — F411 Generalized anxiety disorder: Secondary | ICD-10-CM | POA: Diagnosis not present

## 2019-10-14 DIAGNOSIS — F0670 Mild neurocognitive disorder due to known physiological condition without behavioral disturbance: Secondary | ICD-10-CM

## 2019-10-14 NOTE — Progress Notes (Signed)
 NEUROPSYCHOLOGY FEEDBACK NOTE [facility]  I met with Jenny [name] to review the findings resulting from her neuropsychological evaluation. She was attended by her husband, "Sandy." Since the last appointment, she has been about the same.Time was spent reviewing the impressions and recommendations that are detailed in the evaluation report. I presented her diagnosis of mild vascular neurocognitive disorder, with some transient fluctuation superimposed on that, because her mental status is somewhat fragile. This fit with their experience of her symptoms. We also discussed ways of diffusing tension between them; her husband knows he quizzes her about things and that he should not. They asked about driving, which I would defer to Dr. Masoud, because the biggest concern would be whether her underlying health issues and diabetes are under adequate control. I see no reason from a purely cognitive perspective why she would be unable to drive on a good day. Interventions provided during this encounter included psychoeducation, and other topics as reflected in the patient instructions. I took time to explain the findings and answer all the patient's questions. I encouraged Jenny [name] to contact me should she have any further questions or if further follow up is desired.   Current Medications and Medical History   Current Outpatient Medications  Medication Sig Dispense Refill  . alendronate (FOSAMAX) 70 MG tablet Take 70 mg by mouth once a week. Take with a full glass of water on an empty stomach.    . ALPRAZolam (XANAX) 0.5 MG tablet Take 0.5 mg by mouth 2 (two) times daily.     . aspirin EC 81 MG tablet Take 81 mg by mouth daily.    . Cholecalciferol (VITAMIN D3 PO) Take by mouth daily.    . hydrOXYzine (ATARAX/VISTARIL) 25 MG tablet Take 25 mg by mouth 2 (two) times daily.     . insulin glargine (LANTUS) 100 UNIT/ML injection Inject 25-35 Units into the skin See admin instructions. 35 units  every morning and 25 units at bedtime    . levothyroxine (SYNTHROID, LEVOTHROID) 88 MCG tablet Take 88 mcg by mouth daily.    . Loperamide HCl (IMODIUM A-D PO) Take by mouth as needed.    . Melatonin 10 MG TABS Take by mouth at bedtime.    . metFORMIN (GLUCOPHAGE) 500 MG tablet Take 500 mg by mouth 2 (two) times daily.    . Phenylephrine-DM-GG (MUCINEX FAST-MAX CONGEST COUGH PO) Take by mouth as needed.    . rosuvastatin (CRESTOR) 10 MG tablet Take 1 tablet (10 mg total) by mouth daily. 90 tablet 3  . sertraline (ZOLOFT) 100 MG tablet Take 100 mg by mouth 2 (two) times daily.    . sitaGLIPtin (JANUVIA) 100 MG tablet Take 100 mg by mouth daily.     No current facility-administered medications for this visit.    Patient Active Problem List   Diagnosis Date Noted  . Tibia/fibula fracture, left, closed, initial encounter 05/19/2018  . S/P AVR (aortic valve replacement) 02/17/2017  . Diabetes (HCC) 01/29/2017  . Depression 01/29/2017  . Severe aortic stenosis 01/22/2017  . Chronic systolic CHF (congestive heart failure) (HCC) 01/22/2017  . Anemia 01/16/2017  . Acute CHF (congestive heart failure) (HCC) 01/16/2017  . Elevated troponin 01/16/2017  . Hypokalemia 01/16/2017  . Chest pain 01/15/2017    Mental Status and Behavioral Observations  Ms. Salido was available for this telephonic encounter at the prespecified time and was alert and generally oriented (orientation not formally assessed). Her speech was normal in rate, rhythm, and volume.   Her self-reported mood was "good" and her affect was congruent. Her thought process was logical and linear for the most part, thought content was appropriate. No safety concerns were identified at this visit.   Plan  Feedback provided regarding the patient's neuropsychological evaluation. I will forward a copy of the report to Dr. Plovsky and Dr. Masoud, at the patient's request. The patient and her husband both expressed their sincere appreciation for  the services and clearly feel as though they benefited from their evaluation.  Jenny [name] was encouraged to contact me if any questions arise or if further follow up is desired.   Peter V. Stewart, PsyD, ABN Clinical Neuropsychologist  Service(s) Provided at This Encounter: 50 minutes (90847; Conjoint therapy with patient present)  

## 2019-10-18 ENCOUNTER — Ambulatory Visit: Payer: Medicare Other

## 2019-10-20 ENCOUNTER — Ambulatory Visit: Payer: Medicare Other

## 2019-10-25 ENCOUNTER — Ambulatory Visit: Payer: Medicare Other

## 2019-10-27 ENCOUNTER — Ambulatory Visit: Payer: Medicare Other

## 2019-11-08 ENCOUNTER — Telehealth: Payer: Self-pay | Admitting: Internal Medicine

## 2019-11-08 NOTE — Telephone Encounter (Addendum)
Patient needs a referral to [facility] is it ok to refer her out.

## 2019-11-09 ENCOUNTER — Other Ambulatory Visit: Payer: Self-pay

## 2019-11-09 ENCOUNTER — Ambulatory Visit (INDEPENDENT_AMBULATORY_CARE_PROVIDER_SITE_OTHER): Payer: Medicare Other | Admitting: Internal Medicine

## 2019-11-09 VITALS — BP 151/67 | HR 85 | Wt 142.4 lb

## 2019-11-09 DIAGNOSIS — Z794 Long term (current) use of insulin: Secondary | ICD-10-CM | POA: Diagnosis not present

## 2019-11-09 DIAGNOSIS — E119 Type 2 diabetes mellitus without complications: Secondary | ICD-10-CM

## 2019-11-09 DIAGNOSIS — M12812 Other specific arthropathies, not elsewhere classified, left shoulder: Secondary | ICD-10-CM | POA: Diagnosis not present

## 2019-11-09 LAB — GLUCOSE, POCT (MANUAL RESULT ENTRY): POC Glucose: 117 mg/dl — AB (ref 70–99)

## 2019-11-09 MED ORDER — LIDOCAINE HCL (PF) 1 % IJ SOLN: 2.0000 mL | Freq: Once | INTRAMUSCULAR | Status: DC

## 2019-11-09 MED ORDER — TRIAMCINOLONE ACETONIDE 40 MG/ML IJ SUSP: 40.0000 mg | Freq: Once | INTRAMUSCULAR | Status: DC

## 2019-11-09 NOTE — Progress Notes (Signed)
Established Patient Office Visit  Subjective:  Patient ID: Jenny [name], female    DOB: September 27, 1945  Age: 74 y.o. MRN: [id number]  CC:  Chief Complaint  Patient presents with  . Shoulder Pain    Left Shoulder pain, pt requesting an injection    HPI  Jenny [name] came for office evaluation.  She is known to have diabetes coronary artery disease mitral regurgitation mitral valve replacement.  Today she presented with a left shoulder pain.  She also has a problem with anxiety neurosis which is stable at the present time  Past Medical History:  Diagnosis Date  . Anxiety   . Arthritis   . CAD (coronary artery disease)    a. 01/2017 Cath: nonobs dzs.  . Chronic systolic CHF (congestive heart failure) (HCC)    a. TTE 12/2016, EF 45-50%, probable hypokinesis of the mid apical anteroseptal, anterior, & apical myocardium, GR2DD, possibly bicuspid aortic valve that was moderately thickened w/ severely calcified leaflets, severe aortic stenosis w/ mean gradient 47 mmHg, valve area 0.52 cm, mild MR, mildly dilated LA  . Depression   . Diabetes mellitus with complication (HCC)    Tylenol  . Hyperlipidemia   . Hypothyroidism   . IBS (irritable bowel syndrome)   . Iron deficiency anemia    a. s/p pRBC x1 in 12/2016  . Microcytic anemia   . Osteoporosis   . Panic attacks   . Severe aortic stenosis    a. TTE 12/2016: possibly bicuspid aortic valve that was moderately thickened with severely calcified leaflets. There was severe aortic stenosis with a mean gradient of 47 mmHg and valve area of 0.52 cm; b. 01/2017 s/p AVR w/ 19 mm bioprosthetic Magna Ease pericardial tissue valve, ser# [id number].  . Stroke (HCC) 2001  . TIA (transient ischemic attack) 2006    Past Surgical History:  Procedure Laterality Date  . ABDOMINAL HYSTERECTOMY    . AORTIC VALVE REPLACEMENT N/A 02/17/2017   Procedure: AORTIC VALVE REPLACEMENT (AVR);  Surgeon: Donata Clay, Theron Arista, MD;  Location: [facility] OR;  Service: Open  Heart Surgery;  Laterality: N/A;  . CATARACT EXTRACTION W/PHACO Right 04/09/2016   Procedure: CATARACT EXTRACTION PHACO AND INTRAOCULAR LENS PLACEMENT (IOC);  Surgeon: Galen Manila, MD;  Location: [facility] ORS;  Service: Ophthalmology;  Laterality: Right;  [place] 57.2 AP% 18.6 CDE 10.64 Fluid Pack lot # [id number]  . CATARACT EXTRACTION W/PHACO Left 12/29/2018   Procedure: CATARACT EXTRACTION PHACO AND INTRAOCULAR LENS PLACEMENT (IOC)  LEFT DIABETIC;  Surgeon: Galen Manila, MD;  Location: [facility];  Service: Ophthalmology;  Laterality: Left;  Diabetic - insuli and oral meds  . CHOLECYSTECTOMY    . COLONOSCOPY    . CORONARY ANGIOGRAPHY N/A 02/03/2017   Procedure: CORONARY ANGIOGRAPHY;  Surgeon: Iran Ouch, MD;  Location: [facility] INVASIVE [facility] LAB;  Service: Cardiovascular;  Laterality: N/A;  . FRACTURE SURGERY     LEFT ANKLE  . RIGHT AND LEFT HEART CATH Bilateral 02/03/2017   Procedure: Right and Left Heart Cath;  Surgeon: Iran Ouch, MD;  Location: [facility] INVASIVE CV LAB;  Service: Cardiovascular;  Laterality: Bilateral;  . TEE WITHOUT CARDIOVERSION N/A 02/17/2017   Procedure: TRANSESOPHAGEAL ECHOCARDIOGRAM (TEE);  Surgeon: Donata Clay, Theron Arista, MD;  Location: [facility] OR;  Service: Open Heart Surgery;  Laterality: N/A;  . TIBIA IM NAIL INSERTION Left 05/19/2018   Procedure: INTRAMEDULLARY (IM) NAIL TIBIAL;  Surgeon: Juanell Fairly, MD;  Location: [facility] ORS;  Service: Orthopedics;  Laterality: Left;    Family  History  Problem Relation Age of Onset  . Hypertension Mother     Social History   Socioeconomic History  . Marital status: Married    Spouse name: Not on file  . Number of children: Not on file  . Years of education: Not on file  . Highest education level: Not on file  Occupational History  . Not on file  Tobacco Use  . Smoking status: Former Smoker    Years: 30.00    Quit date: 02/04/2000    Years since quitting: 19.7  . Smokeless tobacco: Never Used  Substance and  Sexual Activity  . Alcohol use: No  . Drug use: No  . Sexual activity: Not Currently  Other Topics Concern  . Not on file  Social History Narrative  . Not on file   Social Determinants of Health   Financial Resource Strain:   . Difficulty of Paying Living Expenses:   Food Insecurity:   . Worried About Programme researcher, broadcasting/film/video in the Last Year:   . Barista in the Last Year:   Transportation Needs:   . Freight forwarder (Medical):   Marland Kitchen Lack of Transportation (Non-Medical):   Physical Activity:   . Days of Exercise per Week:   . Minutes of Exercise per Session:   Stress:   . Feeling of Stress :   Social Connections:   . Frequency of Communication with Friends and Family:   . Frequency of Social Gatherings with Friends and Family:   . Attends Religious Services:   . Active Member of Clubs or Organizations:   . Attends Banker Meetings:   Marland Kitchen Marital Status:   Intimate Partner Violence:   . Fear of Current or Ex-Partner:   . Emotionally Abused:   Marland Kitchen Physically Abused:   . Sexually Abused:      Current Outpatient Medications:  .  alendronate (FOSAMAX) 70 MG tablet, Take 70 mg by mouth once a week. Take with a full glass of water on an empty stomach., Disp: , Rfl:  .  ALPRAZolam (XANAX) 0.5 MG tablet, Take 0.5 mg by mouth 2 (two) times daily. , Disp: , Rfl:  .  aspirin EC 81 MG tablet, Take 81 mg by mouth daily., Disp: , Rfl:  .  Cholecalciferol (VITAMIN D3 PO), Take by mouth daily., Disp: , Rfl:  .  hydrOXYzine (ATARAX/VISTARIL) 25 MG tablet, Take 25 mg by mouth 2 (two) times daily. , Disp: , Rfl:  .  insulin glargine (LANTUS) 100 UNIT/ML injection, Inject 25-35 Units into the skin See admin instructions. 35 units every morning and 25 units at bedtime, Disp: , Rfl:  .  levothyroxine (SYNTHROID, LEVOTHROID) 88 MCG tablet, Take 88 mcg by mouth daily., Disp: , Rfl:  .  Loperamide HCl (IMODIUM A-D PO), Take by mouth as needed., Disp: , Rfl:  .  Melatonin 10 MG  TABS, Take by mouth at bedtime., Disp: , Rfl:  .  metFORMIN (GLUCOPHAGE) 500 MG tablet, Take 500 mg by mouth 2 (two) times daily., Disp: , Rfl:  .  Phenylephrine-DM-GG (MUCINEX FAST-MAX CONGEST COUGH PO), Take by mouth as needed., Disp: , Rfl:  .  sertraline (ZOLOFT) 100 MG tablet, Take 100 mg by mouth 2 (two) times daily., Disp: , Rfl:  .  sitaGLIPtin (JANUVIA) 100 MG tablet, Take 100 mg by mouth daily., Disp: , Rfl:  .  rosuvastatin (CRESTOR) 10 MG tablet, Take 1 tablet (10 mg total) by mouth daily., Disp: 90 tablet,  Rfl: 3  Current Facility-Administered Medications:  .  lidocaine (PF) (XYLOCAINE) 1 % injection 2 mL, 2 mL, Intradermal, Once, Lattie Riege, MD .  triamcinolone acetonide (KENALOG-40) injection 40 mg, 40 mg, Intramuscular, Once, Corky Downs, MD   Allergies  Allergen Reactions  . Iodine Swelling    ANGIOEDEMA FACIAL SWELLING "BETADINE OKAY"  . Shellfish Allergy Anaphylaxis    ROS Review of Systems  Constitutional: Negative for chills, diaphoresis, fatigue and fever.  HENT: Negative for ear pain, hearing loss and nosebleeds.   Eyes: Negative for redness.  Respiratory: Negative for cough.   Cardiovascular: Positive for leg swelling. Negative for chest pain.  Gastrointestinal: Negative for abdominal distention and blood in stool.  Genitourinary: Negative for dysuria.  Musculoskeletal:       Patient complaining of pain in the left shoulder and has trouble moving it up also complains of crepitus.      Objective:    Physical Exam  Constitutional: She appears well-developed and well-nourished.  HENT:  Head: Normocephalic and atraumatic.  Eyes: Pupils are equal, round, and reactive to light.  Neck: No JVD present. No tracheal deviation present. No thyromegaly present.  Cardiovascular: Normal rate and normal heart sounds.  Pulmonary/Chest: Effort normal. No respiratory distress. She has no wheezes. She has rales. She exhibits no tenderness.  Abdominal: She exhibits  mass. She exhibits no distension. There is abdominal tenderness. There is guarding.  Musculoskeletal:     Cervical back: Neck supple.     Comments: Complain of pain in the left shoulder has trouble raising it up and also there is a crepitus on the movements.  She has a decreased motor power in the left forearm she can raise left left arm above the head but with difficulty and crepitus.  Lymphadenopathy:    She has cervical adenopathy.    BP (!) 151/67 (BP Location: Right Arm, Patient Position: Sitting, Cuff Size: Normal)   Pulse 85   Wt 142 lb 6.4 oz (64.6 kg)   BMI 28.28 kg/m  Wt Readings from Last 3 Encounters:  11/09/19 142 lb 6.4 oz (64.6 kg)  09/02/19 147 lb (66.7 kg)  06/06/19 160 lb (72.6 kg)     Health Maintenance Due  Topic Date Due  . Hepatitis C Screening  Never done  . FOOT EXAM  Never done  . OPHTHALMOLOGY EXAM  Never done  . URINE MICROALBUMIN  Never done  . COVID-19 Vaccine (1) Never done  . TETANUS/TDAP  Never done  . MAMMOGRAM  Never done  . COLONOSCOPY  Never done  . DEXA SCAN  Never done  . HEMOGLOBIN A1C  08/16/2017  . PNA vac Low Risk Adult (2 of 2 - PCV13) 05/23/2019    There are no preventive care reminders to display for this patient.  Lab Results  Component Value Date   TSH 0.812 01/16/2017   Lab Results  Component Value Date   WBC 5.2 06/06/2019   HGB 14.1 06/06/2019   HCT 42.4 06/06/2019   MCV 83.5 06/06/2019   PLT 116 (L) 06/06/2019   Lab Results  Component Value Date   NA 133 (L) 06/06/2019   K 4.2 06/06/2019   CO2 24 06/06/2019   GLUCOSE 164 (H) 06/06/2019   BUN 22 06/06/2019   CREATININE 1.01 (H) 06/06/2019   BILITOT 0.5 02/13/2017   ALKPHOS 99 02/13/2017   AST 53 (H) 02/13/2017   ALT 25 02/13/2017   PROT 7.8 02/13/2017   ALBUMIN 3.6 02/13/2017   CALCIUM 8.8 (L)  06/06/2019   ANIONGAP 8 06/06/2019   Lab Results  Component Value Date   CHOL 92 01/15/2017   Lab Results  Component Value Date   HDL 36 (L) 01/15/2017    Lab Results  Component Value Date   LDLCALC 37 01/15/2017   Lab Results  Component Value Date   TRIG 97 01/15/2017   Lab Results  Component Value Date   CHOLHDL 2.6 01/15/2017   Lab Results  Component Value Date   HGBA1C 7.7 (H) 02/13/2017      Assessment & Plan:      Left shoulder was prepared with alcohol wipe and left acromial bursa was injected with 2 cc of lidocaine and 40 mg of Kenalog.  Patient tolerated the procedure well and her condition was stable after the injection.  Problem List Items Addressed This Visit      Endocrine   Diabetes ([place]) (Chronic)   Relevant Orders   POCT glucose (manual entry) (Completed)    Other Visit Diagnoses    Rotator cuff arthropathy of left shoulder    -  Primary   Relevant Medications   triamcinolone acetonide (KENALOG-40) injection 40 mg   lidocaine (PF) (XYLOCAINE) 1 % injection 2 mL    Patient was seen for the left shoulder pain and she was injected with a cortisone shot.  She tolerated the procedure well.  Physical examination is normal blood sugar is under control it was 119 last night.  He was advised to walk on a daily basis  Meds ordered this encounter  Medications  . triamcinolone acetonide (KENALOG-40) injection 40 mg  . lidocaine (PF) (XYLOCAINE) 1 % injection 2 mL    Follow-up: Return in about 4 weeks (around 12/07/2019).    Cletis Athens, MD

## 2019-11-09 NOTE — Telephone Encounter (Signed)
Please ask Morrie Sheldon to make an appointment with [facility] neurology for this patient

## 2019-11-09 NOTE — Progress Notes (Signed)
Joint Injection/Arthrocentesis  Date/Time: 11/09/2019 12:33 PM Performed by: Corky Downs, MD Authorized by: Corky Downs, MD  Indications: pain  Body area: shoulder Joint: left shoulder Local anesthesia used: yes  Anesthesia: Local anesthesia used: yes Local Anesthetic: lidocaine 2% without epinephrine  Sedation: Patient sedated: no  Needle size: 18 G Approach: posterior Comments: Left shoulder was cleaned with alcohol wipe and under complete aseptic precautions 2 cc of lidocaine was infiltrated in the left subacromial bursa.  And next 40 mg of Kenalog was injected on the same side patient tolerated the procedure well and after the procedures she was sent to the waiting room in a stable condition

## 2019-11-16 ENCOUNTER — Other Ambulatory Visit: Payer: Self-pay | Admitting: *Deleted

## 2019-11-16 ENCOUNTER — Ambulatory Visit: Payer: Medicare Other | Admitting: Internal Medicine

## 2019-11-16 MED ORDER — ALPRAZOLAM 0.5 MG PO TABS: 0.5000 mg | ORAL_TABLET | Freq: Two times a day (BID) | ORAL | 0 refills | Status: DC

## 2019-11-30 ENCOUNTER — Ambulatory Visit: Payer: Medicare Other | Admitting: Neurology

## 2019-12-07 ENCOUNTER — Ambulatory Visit: Payer: Medicare Other | Admitting: Neurology

## 2020-01-04 ENCOUNTER — Ambulatory Visit (INDEPENDENT_AMBULATORY_CARE_PROVIDER_SITE_OTHER): Payer: Medicare Other | Admitting: Neurology

## 2020-01-04 ENCOUNTER — Encounter: Payer: Self-pay | Admitting: Neurology

## 2020-01-04 ENCOUNTER — Other Ambulatory Visit: Payer: Self-pay

## 2020-01-04 VITALS — BP 123/70 | HR 91 | Ht 59.5 in | Wt 136.0 lb

## 2020-01-04 DIAGNOSIS — G629 Polyneuropathy, unspecified: Secondary | ICD-10-CM

## 2020-01-04 DIAGNOSIS — F39 Unspecified mood [affective] disorder: Secondary | ICD-10-CM

## 2020-01-04 DIAGNOSIS — R413 Other amnesia: Secondary | ICD-10-CM | POA: Diagnosis not present
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
## 2020-01-04 NOTE — Patient Instructions (Addendum)
As discussed, we will continue to monitor your memory scores and your symptoms.  With regards to concern for neuropathy, I will order an EMG and nerve conduction velocity test, which is an electrical nerve and muscle test, which we will schedule. We will call you with the results.  As explained, good diabetes control is key to prevent further nerve damage.  You may have mild neuropathy secondary to diabetes.  Please try to hydrate better with water, 6 to 8 cups/day are recommended, 8 ounce each.  Please talk to your psychiatrist about mood related issues including low frustration tolerance and mood irritability, anxiety and depression.

## 2020-01-14 ENCOUNTER — Ambulatory Visit: Payer: Medicare Other | Admitting: Internal Medicine

## 2020-01-17 ENCOUNTER — Encounter: Payer: Self-pay | Admitting: Internal Medicine

## 2020-01-17 ENCOUNTER — Ambulatory Visit (INDEPENDENT_AMBULATORY_CARE_PROVIDER_SITE_OTHER): Payer: Medicare Other | Admitting: Internal Medicine

## 2020-01-17 ENCOUNTER — Other Ambulatory Visit: Payer: Self-pay

## 2020-01-17 VITALS — BP 151/67 | HR 91 | Ht 59.0 in | Wt 138.0 lb

## 2020-01-17 DIAGNOSIS — I5022 Chronic systolic (congestive) heart failure: Secondary | ICD-10-CM | POA: Diagnosis not present

## 2020-01-17 DIAGNOSIS — D649 Anemia, unspecified: Secondary | ICD-10-CM | POA: Diagnosis not present

## 2020-01-17 DIAGNOSIS — Z952 Presence of prosthetic heart valve: Secondary | ICD-10-CM

## 2020-01-17 DIAGNOSIS — Z794 Long term (current) use of insulin: Secondary | ICD-10-CM | POA: Diagnosis not present

## 2020-01-17 DIAGNOSIS — E119 Type 2 diabetes mellitus without complications: Secondary | ICD-10-CM | POA: Diagnosis not present

## 2020-01-17 DIAGNOSIS — F419 Anxiety disorder, unspecified: Secondary | ICD-10-CM | POA: Diagnosis not present

## 2020-01-17 MED ORDER — ALPRAZOLAM 0.5 MG PO TABS: 0.5000 mg | ORAL_TABLET | Freq: Two times a day (BID) | ORAL | 0 refills | Status: DC

## 2020-01-17 NOTE — Progress Notes (Addendum)
Established Patient Office Visit  SUBJECTIVE:  Subjective  Patient ID: Jenny [name], female    DOB: Dec 31, 1945  Age: 74 y.o. MRN: [id number]  CC:  Chief Complaint  Patient presents with  . Anxiety    patient here for refill of xanax     HPI Jenny [name] is a 74 y.o. female presenting today for a medication refill for her xanax.   She notes that her brother fell in the bathtub, knocked himself out, had a stroke, and was put in a nursing home; she notes that he was a Technical sales engineer and a Education administrator, and now he can't do anything after his stroke. This has put a great deal of stress on her and she is worried about her own mortality. She is greatly afraid of having another stroke and this has been weighing heavily on her mind. She states that this is all that she can think about and she is worried that her pacemaker will stop working.   She has no other current complaints.   Past Medical History:  Diagnosis Date  . Anxiety   . Arthritis   . CAD (coronary artery disease)    a. 01/2017 Cath: nonobs dzs.  . Chronic systolic CHF (congestive heart failure) (HCC)    a. TTE 12/2016, EF 45-50%, probable hypokinesis of the mid apical anteroseptal, anterior, & apical myocardium, GR2DD, possibly bicuspid aortic valve that was moderately thickened w/ severely calcified leaflets, severe aortic stenosis w/ mean gradient 47 mmHg, valve area 0.52 cm, mild MR, mildly dilated LA  . Depression   . Diabetes mellitus with complication (HCC)    Tylenol  . Hyperlipidemia   . Hypothyroidism   . IBS (irritable bowel syndrome)   . Iron deficiency anemia    a. s/p pRBC x1 in 12/2016  . Microcytic anemia   . Osteoporosis   . Panic attacks   . Severe aortic stenosis    a. TTE 12/2016: possibly bicuspid aortic valve that was moderately thickened with severely calcified leaflets. There was severe aortic stenosis with a mean gradient of 47 mmHg and valve area of 0.52 cm; b. 01/2017 s/p AVR w/ 19 mm bioprosthetic  Magna Ease pericardial tissue valve, ser# [id number].  . Stroke (HCC) 2001  . TIA (transient ischemic attack) 2006    Past Surgical History:  Procedure Laterality Date  . ABDOMINAL HYSTERECTOMY    . AORTIC VALVE REPLACEMENT N/A 02/17/2017   Procedure: AORTIC VALVE REPLACEMENT (AVR);  Surgeon: Donata Clay, Theron Arista, MD;  Location: [facility] OR;  Service: Open Heart Surgery;  Laterality: N/A;  . CATARACT EXTRACTION W/PHACO Right 04/09/2016   Procedure: CATARACT EXTRACTION PHACO AND INTRAOCULAR LENS PLACEMENT (IOC);  Surgeon: Galen Manila, MD;  Location: [facility] ORS;  Service: Ophthalmology;  Laterality: Right;  [place] 57.2 AP% 18.6 CDE 10.64 Fluid Pack lot # [id number]  . CATARACT EXTRACTION W/PHACO Left 12/29/2018   Procedure: CATARACT EXTRACTION PHACO AND INTRAOCULAR LENS PLACEMENT (IOC)  LEFT DIABETIC;  Surgeon: Galen Manila, MD;  Location: [facility];  Service: Ophthalmology;  Laterality: Left;  Diabetic - insuli and oral meds  . CHOLECYSTECTOMY    . COLONOSCOPY    . CORONARY ANGIOGRAPHY N/A 02/03/2017   Procedure: CORONARY ANGIOGRAPHY;  Surgeon: Iran Ouch, MD;  Location: [facility] INVASIVE CV LAB;  Service: Cardiovascular;  Laterality: N/A;  . FRACTURE SURGERY     LEFT ANKLE  . RIGHT AND LEFT HEART CATH Bilateral 02/03/2017   Procedure: Right and Left Heart Cath;  Surgeon: Iran Ouch,  MD;  Location: [facility] INVASIVE CV LAB;  Service: Cardiovascular;  Laterality: Bilateral;  . TEE WITHOUT CARDIOVERSION N/A 02/17/2017   Procedure: TRANSESOPHAGEAL ECHOCARDIOGRAM (TEE);  Surgeon: Donata Clay, Theron Arista, MD;  Location: [facility] OR;  Service: Open Heart Surgery;  Laterality: N/A;  . TIBIA IM NAIL INSERTION Left 05/19/2018   Procedure: INTRAMEDULLARY (IM) NAIL TIBIAL;  Surgeon: Juanell Fairly, MD;  Location: [facility] ORS;  Service: Orthopedics;  Laterality: Left;    Family History  Problem Relation Age of Onset  . Hypertension Mother     Social History   Socioeconomic History  . Marital status:  Married    Spouse name: Not on file  . Number of children: Not on file  . Years of education: Not on file  . Highest education level: Not on file  Occupational History  . Not on file  Tobacco Use  . Smoking status: Former Smoker    Years: 30.00    Quit date: 02/04/2000    Years since quitting: 19.9  . Smokeless tobacco: Never Used  Vaping Use  . Vaping Use: Never used  Substance and Sexual Activity  . Alcohol use: No  . Drug use: No  . Sexual activity: Not Currently  Other Topics Concern  . Not on file  Social History Narrative  . Not on file   Social Determinants of Health   Financial Resource Strain:   . Difficulty of Paying Living Expenses:   Food Insecurity:   . Worried About Programme researcher, broadcasting/film/video in the Last Year:   . Barista in the Last Year:   Transportation Needs:   . Freight forwarder (Medical):   Marland Kitchen Lack of Transportation (Non-Medical):   Physical Activity:   . Days of Exercise per Week:   . Minutes of Exercise per Session:   Stress:   . Feeling of Stress :   Social Connections:   . Frequency of Communication with Friends and Family:   . Frequency of Social Gatherings with Friends and Family:   . Attends Religious Services:   . Active Member of Clubs or Organizations:   . Attends Banker Meetings:   Marland Kitchen Marital Status:   Intimate Partner Violence:   . Fear of Current or Ex-Partner:   . Emotionally Abused:   Marland Kitchen Physically Abused:   . Sexually Abused:      Current Outpatient Medications:  .  alendronate (FOSAMAX) 70 MG tablet, Take 70 mg by mouth once a week. Take with a full glass of water on an empty stomach., Disp: , Rfl:  .  ALPRAZolam (XANAX) 0.5 MG tablet, Take 1 tablet (0.5 mg total) by mouth 2 (two) times daily., Disp: 60 tablet, Rfl: 0 .  aspirin EC 81 MG tablet, Take 81 mg by mouth daily., Disp: , Rfl:  .  Cholecalciferol (VITAMIN D3 PO), Take by mouth daily., Disp: , Rfl:  .  hydrOXYzine (ATARAX/VISTARIL) 25 MG tablet,  Take 25 mg by mouth 2 (two) times daily. , Disp: , Rfl:  .  insulin glargine (LANTUS) 100 UNIT/ML injection, Inject 25-35 Units into the skin See admin instructions. 35 units every morning and 25 units at bedtime, Disp: , Rfl:  .  levothyroxine (SYNTHROID, LEVOTHROID) 88 MCG tablet, Take 88 mcg by mouth daily., Disp: , Rfl:  .  Loperamide HCl (IMODIUM A-D PO), Take by mouth as needed., Disp: , Rfl:  .  Melatonin 10 MG TABS, Take by mouth at bedtime., Disp: , Rfl:  .  metFORMIN (  GLUCOPHAGE) 500 MG tablet, Take 500 mg by mouth 2 (two) times daily., Disp: , Rfl:  .  Phenylephrine-DM-GG (MUCINEX FAST-MAX CONGEST COUGH PO), Take by mouth as needed., Disp: , Rfl:  .  rosuvastatin (CRESTOR) 10 MG tablet, Take 1 tablet (10 mg total) by mouth daily., Disp: 90 tablet, Rfl: 3 .  sertraline (ZOLOFT) 100 MG tablet, Take 100 mg by mouth 2 (two) times daily., Disp: , Rfl:  .  sitaGLIPtin (JANUVIA) 100 MG tablet, Take 100 mg by mouth daily., Disp: , Rfl:   Current Facility-Administered Medications:  .  lidocaine (PF) (XYLOCAINE) 1 % injection 2 mL, 2 mL, Intradermal, Once, Masoud, Javed, MD .  triamcinolone acetonide (KENALOG-40) injection 40 mg, 40 mg, Intramuscular, Once, Corky Downs, MD   Allergies  Allergen Reactions  . Iodine Swelling    ANGIOEDEMA FACIAL SWELLING "BETADINE OKAY"  . Shellfish Allergy Anaphylaxis    ROS Review of Systems  Constitutional: Negative.   HENT: Negative.   Eyes: Negative.   Respiratory: Negative.   Cardiovascular: Negative.   Gastrointestinal: Negative.   Endocrine: Negative.   Genitourinary: Negative.   Musculoskeletal: Negative.   Skin: Negative.   Allergic/Immunologic: Negative.   Neurological: Negative.   Hematological: Negative.   Psychiatric/Behavioral: The patient is nervous/anxious.   All other systems reviewed and are negative.    OBJECTIVE:    Physical Exam Vitals reviewed.  Constitutional:      Appearance: Normal appearance.  HENT:      Mouth/Throat:     Mouth: Mucous membranes are moist.  Eyes:     Pupils: Pupils are equal, round, and reactive to light.     Comments: Conjunctivae is pale  Cardiovascular:     Rate and Rhythm: Normal rate and regular rhythm.     Pulses: Normal pulses.     Heart sounds: Murmur heard.  Systolic murmur is present with a grade of 2/6.   Pulmonary:     Effort: Pulmonary effort is normal.     Breath sounds: Normal breath sounds.  Chest:     Comments: Scar present from bypass surgery Abdominal:     Palpations: There is no hepatomegaly, splenomegaly or mass.     Tenderness: There is no abdominal tenderness.  Musculoskeletal:     Right lower leg: No edema.     Left lower leg: No edema.  Skin:    Coloration: Skin is pale.  Neurological:     Mental Status: She is alert and oriented to person, place, and time.  Psychiatric:        Mood and Affect: Affect normal. Mood is anxious.        Behavior: Behavior normal.     BP (!) 151/67   Pulse 91   Ht 4\' 11"  (1.499 m)   Wt 138 lb (62.6 kg)   BMI 27.87 kg/m  Wt Readings from Last 3 Encounters:  01/17/20 138 lb (62.6 kg)  01/04/20 136 lb (61.7 kg)  11/09/19 142 lb 6.4 oz (64.6 kg)    Health Maintenance Due  Topic Date Due  . Hepatitis C Screening  Never done  . FOOT EXAM  Never done  . OPHTHALMOLOGY EXAM  Never done  . URINE MICROALBUMIN  Never done  . COVID-19 Vaccine (1) Never done  . TETANUS/TDAP  Never done  . MAMMOGRAM  Never done  . COLONOSCOPY  Never done  . DEXA SCAN  Never done  . HEMOGLOBIN A1C  08/16/2017  . PNA vac Low Risk Adult (2 of 2 -  PCV13) 05/23/2019    There are no preventive care reminders to display for this patient.  CBC Latest Ref Rng & Units 06/06/2019 05/22/2018 05/21/2018  WBC 4.0 - 10.5 K/uL 5.2 4.9 5.2  Hemoglobin 12.0 - 15.0 g/dL 27.7 10.6(L) 10.6(L)  Hematocrit 36 - 46 % 42.4 33.4(L) 33.7(L)  Platelets 150 - 400 K/uL 116(L) 110(L) 100(L)   CMP Latest Ref Rng & Units 06/06/2019 05/21/2018  05/20/2018  Glucose 70 - 99 mg/dL 824(M) 353(I) 144(R)  BUN 8 - 23 mg/dL 22 11 13   Creatinine 0.44 - 1.00 mg/dL 1.54(M) 0.86 7.61  Sodium 135 - 145 mmol/L 133(L) 140 138  Potassium 3.5 - 5.1 mmol/L 4.2 4.4 4.7  Chloride 98 - 111 mmol/L 101 108 107  CO2 22 - 32 mmol/L 24 24 25   Calcium 8.9 - 10.3 mg/dL 9.5(K) 9.3(O) 7.8(L)  Total Protein 6.5 - 8.1 g/dL - - -  Total Bilirubin 0.3 - 1.2 mg/dL - - -  Alkaline Phos 38 - 126 U/L - - -  AST 15 - 41 U/L - - -  ALT 14 - 54 U/L - - -    Lab Results  Component Value Date   TSH 0.812 01/16/2017   Lab Results  Component Value Date   ALBUMIN 3.6 02/13/2017   ANIONGAP 8 06/06/2019   Lab Results  Component Value Date   CHOL 92 01/15/2017   CHOL 132 03/02/2012   HDL 36 (L) 01/15/2017   HDL 47 03/02/2012   LDLCALC 37 01/15/2017   LDLCALC 46 03/02/2012   CHOLHDL 2.6 01/15/2017   Lab Results  Component Value Date   TRIG 97 01/15/2017   Lab Results  Component Value Date   HGBA1C 7.7 (H) 02/13/2017   HGBA1C 8.9 (H) 01/15/2017   HGBA1C 8.6 (H) 10/23/2014      ASSESSMENT & PLAN:   Problem List Items Addressed This Visit      Cardiovascular and Mediastinum   Chronic systolic CHF (congestive heart failure) (HCC)    History of heart failure was due to aortic valve disease it is under control at the present time.  She denies any history of chest pain palpitation or swelling of the leg.  Has intestinal murmur 2 x 6 along the left sternal border.  Without any aortic regurgitation.        Endocrine   Diabetes (HCC) (Chronic)    Diabetes mellitus is under control.  Has lost weight.  Feels tired and fatigued and I will check her CBC        Other   Anemia - Primary    She complains of weakness tiredness , conjunctiva was pale.  We will order a CBC on her.      Relevant Orders   CBC with Differential/Platelet   S/P AVR (aortic valve replacement)    Patient aortic valve is stable.  No regurgitant jet was noted on physical exam        Anxiety   - Patient experiencing high levels of anxiety.  - Encouraged patient to engage in relaxing activities like yoga, meditation, journaling, going for a walk, or participating in a hobby.  - Encouraged patient to reach out to trusted friends or family members about recent struggles   Meds ordered this encounter  Medications  . ALPRAZolam (XANAX) 0.5 MG tablet    Sig: Take 1 tablet (0.5 mg total) by mouth 2 (two) times daily.    Dispense:  60 tablet    Refill:  0  Follow-up: Return in about 4 weeks (around 02/14/2020).    Dr. Woodroe Chen [facility] [address] [place], [place] [address]   By signing my name below, I, [employer], attest that this documentation has been prepared under the direction and in the presence of Corky Downs, MD. Electronically Signed: Corky Downs, MD 01/17/20, 8:13 PM   I personally performed the services described in this documentation, which was SCRIBED in my presence. The recorded information has been reviewed and considered accurate. It has been edited as necessary during review. Corky Downs, MD

## 2020-01-17 NOTE — Assessment & Plan Note (Signed)
-   Patient experiencing high levels of anxiety.  - Encouraged patient to engage in relaxing activities like yoga, meditation, journaling, going for a walk, or participating in a hobby.  - Encouraged patient to reach out to trusted friends or family members about recent struggles 

## 2020-01-17 NOTE — Assessment & Plan Note (Signed)
History of heart failure was due to aortic valve disease it is under control at the present time.  She denies any history of chest pain palpitation or swelling of the leg.  Has intestinal murmur 2 x 6 along the left sternal border.  Without any aortic regurgitation.

## 2020-01-17 NOTE — Assessment & Plan Note (Signed)
She complains of weakness tiredness of the conjunctiva was pale.  We will order a CBC on her.

## 2020-01-17 NOTE — Assessment & Plan Note (Signed)
Diabetes mellitus is under control.  Has lost weight.  Feels tired and fatigued and I will check her CBC

## 2020-01-17 NOTE — Assessment & Plan Note (Signed)
Patient aortic valve is stable.  No regurgitant jet was noted on physical exam

## 2020-01-18 LAB — CBC WITH DIFFERENTIAL/PLATELET
Absolute Monocytes: 328 cells/uL (ref 200–950)
Basophils Absolute: 21 cells/uL (ref 0–200)
Basophils Relative: 0.5 %
Eosinophils Absolute: 201 cells/uL (ref 15–500)
Eosinophils Relative: 4.9 %
HCT: 39.1 % (ref 35.0–45.0)
Hemoglobin: 12.9 g/dL (ref 11.7–15.5)
Lymphs Abs: 972 cells/uL (ref 850–3900)
MCH: 29 pg (ref 27.0–33.0)
MCHC: 33 g/dL (ref 32.0–36.0)
MCV: 87.9 fL (ref 80.0–100.0)
MPV: 11.2 fL (ref 7.5–12.5)
Monocytes Relative: 8 %
Neutro Abs: 2579 cells/uL (ref 1500–7800)
Neutrophils Relative %: 62.9 %
Platelets: 87 10*3/uL — ABNORMAL LOW (ref 140–400)
RBC: 4.45 10*6/uL (ref 3.80–5.10)
RDW: 15.2 % — ABNORMAL HIGH (ref 11.0–15.0)
Total Lymphocyte: 23.7 %
WBC: 4.1 10*3/uL (ref 3.8–10.8)

## 2020-01-24 ENCOUNTER — Other Ambulatory Visit: Payer: Self-pay

## 2020-01-24 ENCOUNTER — Ambulatory Visit (INDEPENDENT_AMBULATORY_CARE_PROVIDER_SITE_OTHER): Payer: Medicare Other | Admitting: Internal Medicine

## 2020-01-24 ENCOUNTER — Encounter: Payer: Self-pay | Admitting: Internal Medicine

## 2020-01-24 VITALS — BP 150/76 | HR 101 | Ht 59.0 in | Wt 134.1 lb

## 2020-01-24 DIAGNOSIS — F419 Anxiety disorder, unspecified: Secondary | ICD-10-CM | POA: Diagnosis not present

## 2020-01-24 DIAGNOSIS — Z952 Presence of prosthetic heart valve: Secondary | ICD-10-CM

## 2020-01-24 DIAGNOSIS — Z794 Long term (current) use of insulin: Secondary | ICD-10-CM

## 2020-01-24 DIAGNOSIS — I5022 Chronic systolic (congestive) heart failure: Secondary | ICD-10-CM

## 2020-01-24 DIAGNOSIS — E119 Type 2 diabetes mellitus without complications: Secondary | ICD-10-CM | POA: Diagnosis not present

## 2020-01-24 DIAGNOSIS — F3340 Major depressive disorder, recurrent, in remission, unspecified: Secondary | ICD-10-CM | POA: Diagnosis not present

## 2020-01-24 DIAGNOSIS — D649 Anemia, unspecified: Secondary | ICD-10-CM

## 2020-01-24 NOTE — Progress Notes (Signed)
Established Patient Office Visit  Subjective:  Patient ID: Jenny [name], female    DOB: 1945-08-21  Age: 74 y.o. MRN: [id number]  CC:  Chief Complaint  Patient presents with  . lab results    Anxiety Presents for follow-up visit. Patient reports no chest pain, confusion, dizziness, feeling of choking, hyperventilation, malaise, nausea, restlessness or shortness of breath. Symptoms occur most days. The severity of symptoms is moderate. The quality of sleep is fair. Nighttime awakenings: occasional.   Compliance with medications is 51-75%.    [name] presents for review  Of labs  Past Medical History:  Diagnosis Date  . Anxiety   . Arthritis   . CAD (coronary artery disease)    a. 01/2017 Cath: nonobs dzs.  . Chronic systolic CHF (congestive heart failure) (HCC)    a. TTE 12/2016, EF 45-50%, probable hypokinesis of the mid apical anteroseptal, anterior, & apical myocardium, GR2DD, possibly bicuspid aortic valve that was moderately thickened w/ severely calcified leaflets, severe aortic stenosis w/ mean gradient 47 mmHg, valve area 0.52 cm, mild MR, mildly dilated LA  . Depression   . Diabetes mellitus with complication (HCC)    Tylenol  . Hyperlipidemia   . Hypothyroidism   . IBS (irritable bowel syndrome)   . Iron deficiency anemia    a. s/p pRBC x1 in 12/2016  . Microcytic anemia   . Osteoporosis   . Panic attacks   . Severe aortic stenosis    a. TTE 12/2016: possibly bicuspid aortic valve that was moderately thickened with severely calcified leaflets. There was severe aortic stenosis with a mean gradient of 47 mmHg and valve area of 0.52 cm; b. 01/2017 s/p AVR w/ 19 mm bioprosthetic Magna Ease pericardial tissue valve, ser# [id number].  . Stroke (HCC) 2001  . TIA (transient ischemic attack) 2006    Past Surgical History:  Procedure Laterality Date  . ABDOMINAL HYSTERECTOMY    . AORTIC VALVE REPLACEMENT N/A 02/17/2017   Procedure: AORTIC VALVE REPLACEMENT  (AVR);  Surgeon: Donata Clay, Theron Arista, MD;  Location: [facility] OR;  Service: Open Heart Surgery;  Laterality: N/A;  . CATARACT EXTRACTION W/PHACO Right 04/09/2016   Procedure: CATARACT EXTRACTION PHACO AND INTRAOCULAR LENS PLACEMENT (IOC);  Surgeon: Galen Manila, MD;  Location: [facility] ORS;  Service: Ophthalmology;  Laterality: Right;  [place] 57.2 AP% 18.6 CDE 10.64 Fluid Pack lot # [id number]  . CATARACT EXTRACTION W/PHACO Left 12/29/2018   Procedure: CATARACT EXTRACTION PHACO AND INTRAOCULAR LENS PLACEMENT (IOC)  LEFT DIABETIC;  Surgeon: Galen Manila, MD;  Location: [facility];  Service: Ophthalmology;  Laterality: Left;  Diabetic - insuli and oral meds  . CHOLECYSTECTOMY    . COLONOSCOPY    . CORONARY ANGIOGRAPHY N/A 02/03/2017   Procedure: CORONARY ANGIOGRAPHY;  Surgeon: Iran Ouch, MD;  Location: [facility] INVASIVE CV LAB;  Service: Cardiovascular;  Laterality: N/A;  . FRACTURE SURGERY     LEFT ANKLE  . RIGHT AND LEFT HEART CATH Bilateral 02/03/2017   Procedure: Right and Left Heart Cath;  Surgeon: Iran Ouch, MD;  Location: [facility] INVASIVE CV LAB;  Service: Cardiovascular;  Laterality: Bilateral;  . TEE WITHOUT CARDIOVERSION N/A 02/17/2017   Procedure: TRANSESOPHAGEAL ECHOCARDIOGRAM (TEE);  Surgeon: Donata Clay, Theron Arista, MD;  Location: [facility] OR;  Service: Open Heart Surgery;  Laterality: N/A;  . TIBIA IM NAIL INSERTION Left 05/19/2018   Procedure: INTRAMEDULLARY (IM) NAIL TIBIAL;  Surgeon: Juanell Fairly, MD;  Location: [facility] ORS;  Service: Orthopedics;  Laterality: Left;  Family History  Problem Relation Age of Onset  . Hypertension Mother     Social History   Socioeconomic History  . Marital status: Married    Spouse name: Not on file  . Number of children: Not on file  . Years of education: Not on file  . Highest education level: Not on file  Occupational History  . Not on file  Tobacco Use  . Smoking status: Former Smoker    Years: 30.00    Quit date: 02/04/2000    Years  since quitting: 19.9  . Smokeless tobacco: Never Used  Vaping Use  . Vaping Use: Never used  Substance and Sexual Activity  . Alcohol use: No  . Drug use: No  . Sexual activity: Not Currently  Other Topics Concern  . Not on file  Social History Narrative  . Not on file   Social Determinants of Health   Financial Resource Strain:   . Difficulty of Paying Living Expenses:   Food Insecurity:   . Worried About Programme researcher, broadcasting/film/video in the Last Year:   . Barista in the Last Year:   Transportation Needs:   . Freight forwarder (Medical):   Marland Kitchen Lack of Transportation (Non-Medical):   Physical Activity:   . Days of Exercise per Week:   . Minutes of Exercise per Session:   Stress:   . Feeling of Stress :   Social Connections:   . Frequency of Communication with Friends and Family:   . Frequency of Social Gatherings with Friends and Family:   . Attends Religious Services:   . Active Member of Clubs or Organizations:   . Attends Banker Meetings:   Marland Kitchen Marital Status:   Intimate Partner Violence:   . Fear of Current or Ex-Partner:   . Emotionally Abused:   Marland Kitchen Physically Abused:   . Sexually Abused:      Current Outpatient Medications:  .  alendronate (FOSAMAX) 70 MG tablet, Take 70 mg by mouth once a week. Take with a full glass of water on an empty stomach., Disp: , Rfl:  .  ALPRAZolam (XANAX) 0.5 MG tablet, Take 1 tablet (0.5 mg total) by mouth 2 (two) times daily., Disp: 60 tablet, Rfl: 0 .  aspirin EC 81 MG tablet, Take 81 mg by mouth daily., Disp: , Rfl:  .  Cholecalciferol (VITAMIN D3 PO), Take by mouth daily., Disp: , Rfl:  .  hydrOXYzine (ATARAX/VISTARIL) 25 MG tablet, Take 25 mg by mouth 2 (two) times daily. , Disp: , Rfl:  .  insulin glargine (LANTUS) 100 UNIT/ML injection, Inject 25-35 Units into the skin See admin instructions. 35 units every morning and 25 units at bedtime, Disp: , Rfl:  .  levothyroxine (SYNTHROID, LEVOTHROID) 88 MCG tablet, Take  88 mcg by mouth daily., Disp: , Rfl:  .  Loperamide HCl (IMODIUM A-D PO), Take by mouth as needed., Disp: , Rfl:  .  Melatonin 10 MG TABS, Take by mouth at bedtime., Disp: , Rfl:  .  metFORMIN (GLUCOPHAGE) 500 MG tablet, Take 500 mg by mouth 2 (two) times daily., Disp: , Rfl:  .  Phenylephrine-DM-GG (MUCINEX FAST-MAX CONGEST COUGH PO), Take by mouth as needed., Disp: , Rfl:  .  sertraline (ZOLOFT) 100 MG tablet, Take 100 mg by mouth 2 (two) times daily., Disp: , Rfl:  .  sitaGLIPtin (JANUVIA) 100 MG tablet, Take 100 mg by mouth daily., Disp: , Rfl:  .  rosuvastatin (CRESTOR) 10 MG  tablet, Take 1 tablet (10 mg total) by mouth daily., Disp: 90 tablet, Rfl: 3  Current Facility-Administered Medications:  .  lidocaine (PF) (XYLOCAINE) 1 % injection 2 mL, 2 mL, Intradermal, Once, Lillyahna Hemberger, MD .  triamcinolone acetonide (KENALOG-40) injection 40 mg, 40 mg, Intramuscular, Once, Corky Downs, MD   Allergies  Allergen Reactions  . Iodine Swelling    ANGIOEDEMA FACIAL SWELLING "BETADINE OKAY"  . Shellfish Allergy Anaphylaxis    ROS Review of Systems  Constitutional: Negative.   HENT: Negative.   Eyes: Negative.   Respiratory: Negative.  Negative for shortness of breath.   Cardiovascular: Negative.  Negative for chest pain.  Gastrointestinal: Negative.  Negative for nausea.  Endocrine: Negative.   Genitourinary: Negative.   Musculoskeletal: Negative.   Skin: Negative.   Allergic/Immunologic: Negative.   Neurological: Negative.  Negative for dizziness.  Hematological: Negative.   Psychiatric/Behavioral: Negative.  Negative for confusion.  All other systems reviewed and are negative.     Objective:    Physical Exam Vitals reviewed.  Constitutional:      Appearance: Normal appearance.  HENT:     Mouth/Throat:     Mouth: Mucous membranes are moist.  Eyes:     Pupils: Pupils are equal, round, and reactive to light.  Neck:     Vascular: No carotid bruit.  Cardiovascular:      Rate and Rhythm: Normal rate and regular rhythm.     Pulses: Normal pulses.     Heart sounds: Normal heart sounds.  Pulmonary:     Effort: Pulmonary effort is normal.     Breath sounds: Normal breath sounds.  Abdominal:     General: Bowel sounds are normal.     Palpations: Abdomen is soft. There is no hepatomegaly, splenomegaly or mass.     Tenderness: There is no abdominal tenderness.     Hernia: No hernia is present.  Musculoskeletal:        General: No tenderness.     Cervical back: Neck supple.     Right lower leg: No edema.     Left lower leg: No edema.  Skin:    Findings: No rash.  Neurological:     Mental Status: She is alert and oriented to person, place, and time.     Motor: No weakness.  Psychiatric:        Mood and Affect: Mood and affect normal.        Behavior: Behavior normal.     BP (!) 150/76   Pulse 101   Ht 4\' 11"  (1.499 m)   Wt 134 lb 1.6 oz (60.8 kg)   BMI 27.08 kg/m  Wt Readings from Last 3 Encounters:  01/24/20 134 lb 1.6 oz (60.8 kg)  01/17/20 138 lb (62.6 kg)  01/04/20 136 lb (61.7 kg)     Health Maintenance Due  Topic Date Due  . Hepatitis C Screening  Never done  . FOOT EXAM  Never done  . OPHTHALMOLOGY EXAM  Never done  . URINE MICROALBUMIN  Never done  . COVID-19 Vaccine (1) Never done  . TETANUS/TDAP  Never done  . MAMMOGRAM  Never done  . COLONOSCOPY  Never done  . DEXA SCAN  Never done  . HEMOGLOBIN A1C  08/16/2017  . PNA vac Low Risk Adult (2 of 2 - PCV13) 05/23/2019    There are no preventive care reminders to display for this patient.  Lab Results  Component Value Date   TSH 0.812 01/16/2017   Lab  Results  Component Value Date   WBC 4.1 01/17/2020   HGB 12.9 01/17/2020   HCT 39.1 01/17/2020   MCV 87.9 01/17/2020   PLT 87 (L) 01/17/2020   Lab Results  Component Value Date   NA 133 (L) 06/06/2019   K 4.2 06/06/2019   CO2 24 06/06/2019   GLUCOSE 164 (H) 06/06/2019   BUN 22 06/06/2019   CREATININE 1.01 (H)  06/06/2019   BILITOT 0.5 02/13/2017   ALKPHOS 99 02/13/2017   AST 53 (H) 02/13/2017   ALT 25 02/13/2017   PROT 7.8 02/13/2017   ALBUMIN 3.6 02/13/2017   CALCIUM 8.8 (L) 06/06/2019   ANIONGAP 8 06/06/2019   Lab Results  Component Value Date   CHOL 92 01/15/2017   Lab Results  Component Value Date   HDL 36 (L) 01/15/2017   Lab Results  Component Value Date   LDLCALC 37 01/15/2017   Lab Results  Component Value Date   TRIG 97 01/15/2017   Lab Results  Component Value Date   CHOLHDL 2.6 01/15/2017   Lab Results  Component Value Date   HGBA1C 7.7 (H) 02/13/2017      Assessment & Plan:   Problem List Items Addressed This Visit      Cardiovascular and Mediastinum   Chronic systolic CHF (congestive heart failure) (HCC)    stable        Endocrine   Diabetes (HCC) (Chronic)    - The patient's blood sugar is under control on insulin. - The patient will continue the current treatment regimen.  - I encouraged the patient to regularly check blood sugar.  - I encouraged the patient to monitor diet. I encouraged the patient to eat low-carb and low-sugar to help prevent blood sugar spikes.  - I encouraged the patient to continue following their prescribed treatment plan for diabetes - I informed the patient to get help if blood sugar drops below /dL, or if suddenly have trouble thinking clearly or breathing.            Other   Anemia    Stable  atpresent      S/P AVR (aortic valve replacement)    Patient is doing well after his aortic valve replacement.  There is residual 2 x 6 systolic murmur.      Anxiety - Primary    - Patient experiencing high levels of anxiety.  - Encouraged patient to engage in relaxing activities like yoga, meditation, journaling, going for a walk, or participating in a hobby.  - Encouraged patient to reach out to trusted friends or family members about recent struggles           No orders of the defined types were placed in  this encounter.   Follow-up: No follow-ups on file.    Corky Downs, MD

## 2020-01-24 NOTE — Assessment & Plan Note (Signed)
Stable at present.   

## 2020-01-24 NOTE — Assessment & Plan Note (Signed)
stable °

## 2020-01-24 NOTE — Assessment & Plan Note (Signed)
-   The patient's blood sugar is under control on  insulin. - The patient will continue the current treatment regimen.  - I encouraged the patient to regularly check blood sugar.  - I encouraged the patient to monitor diet. I encouraged the patient to eat low-carb and low-sugar to help prevent blood sugar spikes.  - I encouraged the patient to continue following their prescribed treatment plan for diabetes - I informed the patient to get help if blood sugar drops below 54mg/dL, or if suddenly have trouble thinking clearly or breathing.     

## 2020-01-24 NOTE — Assessment & Plan Note (Signed)
-   Patient experiencing high levels of anxiety.  - Encouraged patient to engage in relaxing activities like yoga, meditation, journaling, going for a walk, or participating in a hobby.  - Encouraged patient to reach out to trusted friends or family members about recent struggles 

## 2020-01-24 NOTE — Assessment & Plan Note (Signed)
Patient is doing well after his aortic valve replacement.  There is residual 2 x 6 systolic murmur.

## 2020-02-15 DIAGNOSIS — F3340 Major depressive disorder, recurrent, in remission, unspecified: Secondary | ICD-10-CM | POA: Diagnosis not present

## 2020-02-21 ENCOUNTER — Other Ambulatory Visit: Payer: Self-pay | Admitting: Internal Medicine

## 2020-02-24 ENCOUNTER — Encounter (INDEPENDENT_AMBULATORY_CARE_PROVIDER_SITE_OTHER): Payer: Medicare Other | Admitting: Diagnostic Neuroimaging

## 2020-02-24 ENCOUNTER — Ambulatory Visit (INDEPENDENT_AMBULATORY_CARE_PROVIDER_SITE_OTHER): Payer: Medicare Other | Admitting: Diagnostic Neuroimaging

## 2020-02-24 DIAGNOSIS — G629 Polyneuropathy, unspecified: Secondary | ICD-10-CM | POA: Diagnosis not present

## 2020-02-24 DIAGNOSIS — Z0289 Encounter for other administrative examinations: Secondary | ICD-10-CM

## 2020-02-24 NOTE — Procedures (Signed)
[facility]  NCS (NERVE CONDUCTION STUDY) WITH EMG (ELECTROMYOGRAPHY) REPORT   STUDY DATE: 02/24/20 PATIENT NAME: Jenny [name] DOB: Sep 30, 1945 MRN: [id number]  ORDERING CLINICIAN: Huston Foley, MD PhD   TECHNOLOGIST: Durenda Age ELECTROMYOGRAPHER: Glenford Bayley. Jaquitta Dupriest, MD  CLINICAL INFORMATION: 74 year old female with numbness.  FINDINGS: NERVE CONDUCTION STUDY:  Bilateral peroneal motor responses of decreased amplitudes and normal conduction velocities.   Bilateral tibial motor responses are normal.  Right sural sensory response has slightly decreased amplitude.  Right superficial peroneal sensory response could not be obtained.  Left sural and left superficial peroneal sensory responses are normal.   NEEDLE ELECTROMYOGRAPHY:  Needle examination of right lower extremity vastus medialis, tibialis anterior, gastrocnemius is normal.   IMPRESSION:   This study demonstrates mild axonal sensorimotor polyneuropathy.    INTERPRETING PHYSICIAN:  Suanne Marker, MD Certified in Neurology, Neurophysiology and Neuroimaging  [facility] [address], [address] [place], [place] [address] [phone]   [facility]    Nerve / Sites Muscle Latency Ref. Amplitude Ref. Rel Amp Segments Distance Velocity Ref. Area    ms ms mV mV %  cm m/s m/s mVms  R Peroneal - EDB     Ankle EDB 6.2 ?6.5 1.7 ?2.0 100 Ankle - EDB 9   9.8     Fib head EDB 11.4  1.7  100 Fib head - Ankle 23 44 ?44 9.5     Pop fossa EDB 13.7  1.7  97.2 Pop fossa - Fib head 10 44 ?44 10.4         Pop fossa - Ankle      L Peroneal - EDB     Ankle EDB 5.8 ?6.5 1.4 ?2.0 100 Ankle - EDB 9   6.7     Fib head EDB 10.6  1.3  89.5 Fib head - Ankle 21 44 ?44 6.2     Pop fossa EDB 12.8  1.2  93.5 Pop fossa - Fib head 10 44 ?44 5.6         Pop fossa - Ankle      R Tibial - AH     Ankle AH 5.1 ?5.8 5.5 ?4.0 100 Ankle - AH 9   13.7     Pop fossa AH 12.5  4.4  78.9 Pop fossa - Ankle 30  41 ?41 14.9  L Tibial - AH     Ankle AH 5.7 ?5.8 4.6 ?4.0 100 Ankle - AH 9   12.1     Pop fossa AH 13.0  4.0  87.3 Pop fossa - Ankle 30 41 ?41 10.4             SNC    Nerve / Sites Rec. Site Peak Lat Ref.  Amp Ref. Segments Distance    ms ms V V  cm  R Sural - Ankle (Calf)     Calf Ankle 4.0 ?4.4 4 ?6 Calf - Ankle 14  L Sural - Ankle (Calf)     Calf Ankle 3.8 ?4.4 6 ?6 Calf - Ankle 14  R Superficial peroneal - Ankle     Lat leg Ankle NR ?4.4 NR ?6 Lat leg - Ankle 14  L Superficial peroneal - Ankle     Lat leg Ankle 4.1 ?4.4 6 ?6 Lat leg - Ankle 14             F  Wave    Nerve F Lat Ref.   ms ms  R Tibial -  AH 48.6 ?56.0  L Tibial - AH 49.9 ?56.0         EMG Summary Table    Spontaneous MUAP Recruitment  Muscle IA Fib PSW Fasc Other Amp Dur. Poly Pattern  R. Vastus medialis Normal None None None _______ Normal Normal Normal Normal  R. Tibialis anterior Normal None None None _______ Normal Normal Normal Normal  R. Gastrocnemius (Medial head) Normal None None None _______ Normal Normal Normal Normal

## 2020-02-25 ENCOUNTER — Ambulatory Visit: Payer: Medicare Other | Admitting: Internal Medicine

## 2020-03-01 NOTE — Progress Notes (Signed)
Please call patient or her husband and advise them that her recent EMG and nerve conduction velocity test which is the electrical nerve and muscle test we performed in our office showed evidence of mild neuropathy.  This means that there is evidence of mild nerve damage, most likely secondary to diabetes.  When she comes back for her follow-up appointment, we can certainly add more blood work to look for any other possible treatable cause for neuropathy.

## 2020-03-02 ENCOUNTER — Telehealth: Payer: Self-pay

## 2020-03-02 ENCOUNTER — Ambulatory Visit (INDEPENDENT_AMBULATORY_CARE_PROVIDER_SITE_OTHER): Payer: Medicare Other | Admitting: Internal Medicine

## 2020-03-02 ENCOUNTER — Encounter: Payer: Self-pay | Admitting: Internal Medicine

## 2020-03-02 VITALS — BP 142/55 | HR 90 | Ht 63.0 in | Wt 134.1 lb

## 2020-03-02 DIAGNOSIS — Z0000 Encounter for general adult medical examination without abnormal findings: Secondary | ICD-10-CM | POA: Diagnosis not present

## 2020-03-02 DIAGNOSIS — F419 Anxiety disorder, unspecified: Secondary | ICD-10-CM

## 2020-03-02 DIAGNOSIS — E063 Autoimmune thyroiditis: Secondary | ICD-10-CM

## 2020-03-02 DIAGNOSIS — E038 Other specified hypothyroidism: Secondary | ICD-10-CM | POA: Diagnosis not present

## 2020-03-02 DIAGNOSIS — Z794 Long term (current) use of insulin: Secondary | ICD-10-CM

## 2020-03-02 DIAGNOSIS — E039 Hypothyroidism, unspecified: Secondary | ICD-10-CM | POA: Insufficient documentation

## 2020-03-02 DIAGNOSIS — Z952 Presence of prosthetic heart valve: Secondary | ICD-10-CM

## 2020-03-02 DIAGNOSIS — Z23 Encounter for immunization: Secondary | ICD-10-CM

## 2020-03-02 DIAGNOSIS — E119 Type 2 diabetes mellitus without complications: Secondary | ICD-10-CM | POA: Diagnosis not present

## 2020-03-02 DIAGNOSIS — K589 Irritable bowel syndrome without diarrhea: Secondary | ICD-10-CM | POA: Insufficient documentation

## 2020-03-02 DIAGNOSIS — K580 Irritable bowel syndrome with diarrhea: Secondary | ICD-10-CM | POA: Diagnosis not present

## 2020-03-02 DIAGNOSIS — M8080XD Other osteoporosis with current pathological fracture, unspecified site, subsequent encounter for fracture with routine healing: Secondary | ICD-10-CM | POA: Diagnosis not present

## 2020-03-02 DIAGNOSIS — M810 Age-related osteoporosis without current pathological fracture: Secondary | ICD-10-CM | POA: Insufficient documentation

## 2020-03-02 MED ORDER — ALPRAZOLAM 0.5 MG PO TABS: 0.5000 mg | ORAL_TABLET | Freq: Two times a day (BID) | ORAL | 0 refills | Status: DC

## 2020-03-02 NOTE — Assessment & Plan Note (Signed)
-   Patient experiencing high levels of anxiety.  - Encouraged patient to engage in relaxing activities like yoga, meditation, journaling, going for a walk, or participating in a hobby.  - Encouraged patient to reach out to trusted friends or family members about recent struggles 

## 2020-03-02 NOTE — Assessment & Plan Note (Signed)
Pts current physical is unremarkable. She is known to have CAD without chestpain. She also has aortic valve replacement, valvular murmur or aortic stenosis. She does not have any further episode of TIA or syncope since her valve surgery. Her platelet ct is 87,000, so I asked her to stop taking Aspirin. She is interested in getting her flu vaccine. She is seeing neurology for neuropathy. She was recommended to see an eye doctor. Her memory loss has been stable. She knows the time and distance and name of the president.

## 2020-03-02 NOTE — Telephone Encounter (Signed)
-----   Message from Huston Foley, MD sent at 03/01/2020  4:54 PM EDT ----- Please call patient or her husband and advise them that her recent EMG and nerve conduction velocity test which is the electrical nerve and muscle test we performed in our office showed evidence of mild neuropathy.  This means that there is evidence of mild nerve damage, most likely secondary to diabetes.  When she comes back for her follow-up appointment, we can certainly add more blood work to look for any other possible treatable cause for neuropathy.

## 2020-03-02 NOTE — Assessment & Plan Note (Signed)
Pt has diarrhea from IBS, which she treats with imodium

## 2020-03-02 NOTE — Assessment & Plan Note (Signed)
Thyroid status is stable. She takes her medication regularly.

## 2020-03-02 NOTE — Assessment & Plan Note (Signed)
Her blood sugar is 137 today. She takes [place] and insulin and statin.

## 2020-03-02 NOTE — Progress Notes (Signed)
Established Patient Office Visit  SUBJECTIVE:  Subjective  Patient ID: Jenny [name], female    DOB: July 13, 1945  Age: 74 y.o. MRN: [id number]  CC:  Chief Complaint  Patient presents with  . Annual Exam  . Diabetes    patient checked blood sugar from home and it was 128    HPI Jenny [name] is a 74 y.o. female presenting today for an annual exam.  She notes that she has been having neuropathy in her hands and feet. She saw Dr. Frances Furbish for a nerve conduction velocity test revealing mild nerve damage secondary to diabetes. She sees Dr. Frances Furbish again on 05/08/2020.   She feels well overall. She needs a refill on her Xanax, which she has been taking since the death of her son in the 82s.  She is vaccinated against COVID19. She is interested in getting her flu vaccine today.    Past Medical History:  Diagnosis Date  . Anxiety   . Arthritis   . CAD (coronary artery disease)    a. 01/2017 Cath: nonobs dzs.  . Chronic systolic CHF (congestive heart failure) (HCC)    a. TTE 12/2016, EF 45-50%, probable hypokinesis of the mid apical anteroseptal, anterior, & apical myocardium, GR2DD, possibly bicuspid aortic valve that was moderately thickened w/ severely calcified leaflets, severe aortic stenosis w/ mean gradient 47 mmHg, valve area 0.52 cm, mild MR, mildly dilated LA  . Depression   . Diabetes mellitus with complication (HCC)    Tylenol  . Hyperlipidemia   . Hypothyroidism   . IBS (irritable bowel syndrome)   . Iron deficiency anemia    a. s/p pRBC x1 in 12/2016  . Microcytic anemia   . Osteoporosis   . Panic attacks   . Severe aortic stenosis    a. TTE 12/2016: possibly bicuspid aortic valve that was moderately thickened with severely calcified leaflets. There was severe aortic stenosis with a mean gradient of 47 mmHg and valve area of 0.52 cm; b. 01/2017 s/p AVR w/ 19 mm bioprosthetic Magna Ease pericardial tissue valve, ser# [id number].  . Stroke (HCC) 2001  . TIA (transient  ischemic attack) 2006    Past Surgical History:  Procedure Laterality Date  . ABDOMINAL HYSTERECTOMY    . AORTIC VALVE REPLACEMENT N/A 02/17/2017   Procedure: AORTIC VALVE REPLACEMENT (AVR);  Surgeon: Donata Clay, Theron Arista, MD;  Location: [facility] OR;  Service: Open Heart Surgery;  Laterality: N/A;  . CATARACT EXTRACTION W/PHACO Right 04/09/2016   Procedure: CATARACT EXTRACTION PHACO AND INTRAOCULAR LENS PLACEMENT (IOC);  Surgeon: Galen Manila, MD;  Location: [facility] ORS;  Service: Ophthalmology;  Laterality: Right;  [place] 57.2 AP% 18.6 CDE 10.64 Fluid Pack lot # [id number]  . CATARACT EXTRACTION W/PHACO Left 12/29/2018   Procedure: CATARACT EXTRACTION PHACO AND INTRAOCULAR LENS PLACEMENT (IOC)  LEFT DIABETIC;  Surgeon: Galen Manila, MD;  Location: [facility];  Service: Ophthalmology;  Laterality: Left;  Diabetic - insuli and oral meds  . CHOLECYSTECTOMY    . COLONOSCOPY    . CORONARY ANGIOGRAPHY N/A 02/03/2017   Procedure: CORONARY ANGIOGRAPHY;  Surgeon: Iran Ouch, MD;  Location: [facility] INVASIVE CV LAB;  Service: Cardiovascular;  Laterality: N/A;  . FRACTURE SURGERY     LEFT ANKLE  . RIGHT AND LEFT HEART CATH Bilateral 02/03/2017   Procedure: Right and Left Heart Cath;  Surgeon: Iran Ouch, MD;  Location: [facility] INVASIVE CV LAB;  Service: Cardiovascular;  Laterality: Bilateral;  . TEE WITHOUT CARDIOVERSION N/A 02/17/2017  Procedure: TRANSESOPHAGEAL ECHOCARDIOGRAM (TEE);  Surgeon: Donata Clay, Theron Arista, MD;  Location: [facility] OR;  Service: Open Heart Surgery;  Laterality: N/A;  . TIBIA IM NAIL INSERTION Left 05/19/2018   Procedure: INTRAMEDULLARY (IM) NAIL TIBIAL;  Surgeon: Juanell Fairly, MD;  Location: [facility] ORS;  Service: Orthopedics;  Laterality: Left;    Family History  Problem Relation Age of Onset  . Hypertension Mother     Social History   Socioeconomic History  . Marital status: Married    Spouse name: Not on file  . Number of children: Not on file  . Years of education:  Not on file  . Highest education level: Not on file  Occupational History  . Not on file  Tobacco Use  . Smoking status: Former Smoker    Years: 30.00    Quit date: 02/04/2000    Years since quitting: 20.0  . Smokeless tobacco: Never Used  Vaping Use  . Vaping Use: Never used  Substance and Sexual Activity  . Alcohol use: No  . Drug use: No  . Sexual activity: Not Currently  Other Topics Concern  . Not on file  Social History Narrative  . Not on file   Social Determinants of Health   Financial Resource Strain:   . Difficulty of Paying Living Expenses: Not on file  Food Insecurity:   . Worried About Programme researcher, broadcasting/film/video in the Last Year: Not on file  . Ran Out of Food in the Last Year: Not on file  Transportation Needs:   . Lack of Transportation (Medical): Not on file  . Lack of Transportation (Non-Medical): Not on file  Physical Activity:   . Days of Exercise per Week: Not on file  . Minutes of Exercise per Session: Not on file  Stress:   . Feeling of Stress : Not on file  Social Connections:   . Frequency of Communication with Friends and Family: Not on file  . Frequency of Social Gatherings with Friends and Family: Not on file  . Attends Religious Services: Not on file  . Active Member of Clubs or Organizations: Not on file  . Attends Banker Meetings: Not on file  . Marital Status: Not on file  Intimate Partner Violence:   . Fear of Current or Ex-Partner: Not on file  . Emotionally Abused: Not on file  . Physically Abused: Not on file  . Sexually Abused: Not on file     Current Outpatient Medications:  .  alendronate (FOSAMAX) 70 MG tablet, Take 70 mg by mouth once a week. Take with a full glass of water on an empty stomach., Disp: , Rfl:  .  ALPRAZolam (XANAX) 0.5 MG tablet, Take 1 tablet (0.5 mg total) by mouth 2 (two) times daily., Disp: 60 tablet, Rfl: 0 .  aspirin EC 81 MG tablet, Take 81 mg by mouth daily., Disp: , Rfl:  .  Cholecalciferol  (VITAMIN D3 PO), Take by mouth daily., Disp: , Rfl:  .  hydrOXYzine (ATARAX/VISTARIL) 25 MG tablet, Take 25 mg by mouth 2 (two) times daily. , Disp: , Rfl:  .  insulin glargine (LANTUS) 100 UNIT/ML injection, Inject 25-35 Units into the skin See admin instructions. 35 units every morning and 25 units at bedtime, Disp: , Rfl:  .  levothyroxine (SYNTHROID, LEVOTHROID) 88 MCG tablet, Take 88 mcg by mouth daily., Disp: , Rfl:  .  Loperamide HCl (IMODIUM A-D PO), Take by mouth as needed., Disp: , Rfl:  .  Melatonin 10  MG TABS, Take by mouth at bedtime., Disp: , Rfl:  .  metFORMIN (GLUCOPHAGE) 500 MG tablet, Take 500 mg by mouth 2 (two) times daily., Disp: , Rfl:  .  Phenylephrine-DM-GG (MUCINEX FAST-MAX CONGEST COUGH PO), Take by mouth as needed., Disp: , Rfl:  .  sertraline (ZOLOFT) 100 MG tablet, TAKE 1 TABLET BY MOUTH TWICE DAILY, Disp: 180 tablet, Rfl: 2 .  sitaGLIPtin (JANUVIA) 100 MG tablet, Take 100 mg by mouth daily., Disp: , Rfl:  .  rosuvastatin (CRESTOR) 10 MG tablet, Take 1 tablet (10 mg total) by mouth daily., Disp: 90 tablet, Rfl: 3  Current Facility-Administered Medications:  .  lidocaine (PF) (XYLOCAINE) 1 % injection 2 mL, 2 mL, Intradermal, Once, Miko Sirico, MD .  triamcinolone acetonide (KENALOG-40) injection 40 mg, 40 mg, Intramuscular, Once, Corky Downs, MD   Allergies  Allergen Reactions  . Iodine Swelling    ANGIOEDEMA FACIAL SWELLING "BETADINE OKAY"  . Shellfish Allergy Anaphylaxis    ROS Review of Systems  Constitutional: Negative.   HENT: Negative.   Eyes: Negative.   Respiratory: Negative.  Negative for cough, chest tightness and shortness of breath.   Cardiovascular: Negative.  Negative for chest pain.  Gastrointestinal: Positive for diarrhea (IBS, tx w/ immodium).  Endocrine: Negative.   Genitourinary: Negative.   Musculoskeletal: Negative.   Skin: Negative.   Allergic/Immunologic: Negative.   Neurological: Negative.   Hematological: Negative.     Psychiatric/Behavioral: The patient is nervous/anxious.   All other systems reviewed and are negative.    OBJECTIVE:    Physical Exam Vitals reviewed.  Constitutional:      Appearance: Normal appearance.  HENT:     Mouth/Throat:     Mouth: Mucous membranes are moist.  Eyes:     Pupils: Pupils are equal, round, and reactive to light.  Neck:     Vascular: No carotid bruit.  Cardiovascular:     Rate and Rhythm: Normal rate and regular rhythm.     Pulses: Normal pulses.     Heart sounds: Murmur heard.  Systolic murmur is present with a grade of 2/6.   Pulmonary:     Effort: Pulmonary effort is normal.     Breath sounds: Normal breath sounds.  Abdominal:     General: A surgical scar is present. Bowel sounds are normal.     Palpations: Abdomen is soft. There is no hepatomegaly, splenomegaly or mass.     Tenderness: There is no abdominal tenderness.     Hernia: No hernia is present.  Musculoskeletal:        General: No tenderness.     Cervical back: Neck supple.     Right lower leg: No edema.     Left lower leg: No edema.  Skin:    Findings: No rash.  Neurological:     Mental Status: She is alert and oriented to person, place, and time.     Motor: No weakness.  Psychiatric:        Mood and Affect: Mood and affect normal.        Behavior: Behavior normal.     BP (!) 142/55   Pulse 90   Ht  (1.6 m)   Wt 134 lb 1.6 oz (60.8 kg)   BMI 23.75 kg/m  Wt Readings from Last 3 Encounters:  03/02/20 134 lb 1.6 oz (60.8 kg)  01/24/20 134 lb 1.6 oz (60.8 kg)  01/17/20 138 lb (62.6 kg)    Health Maintenance Due  Topic Date Due  .  Hepatitis C Screening  Never done  . FOOT EXAM  Never done  . OPHTHALMOLOGY EXAM  Never done  . URINE MICROALBUMIN  Never done  . COVID-19 Vaccine (1) Never done  . TETANUS/TDAP  Never done  . MAMMOGRAM  Never done  . COLONOSCOPY  Never done  . DEXA SCAN  Never done  . HEMOGLOBIN A1C  08/16/2017  . PNA vac Low Risk Adult (2 of 2 -  PCV13) 05/23/2019    There are no preventive care reminders to display for this patient.  CBC Latest Ref Rng & Units 01/17/2020 06/06/2019 05/22/2018  WBC 3.8 - 10.8 Thousand/uL 4.1 5.2 4.9  Hemoglobin 11.7 - 15.5 g/dL 63.1 49.7 10.6(L)  Hematocrit 35 - 45 % 39.1 42.4 33.4(L)  Platelets 140 - 400 Thousand/uL 87(L) 116(L) 110(L)   CMP Latest Ref Rng & Units 06/06/2019 05/21/2018 05/20/2018  Glucose 70 - 99 mg/dL 026(V) 785(Y) 850(Y)  BUN 8 - 23 mg/dL 22 11 13   Creatinine 0.44 - 1.00 mg/dL 7.74(J) 2.87 8.67  Sodium 135 - 145 mmol/L 133(L) 140 138  Potassium 3.5 - 5.1 mmol/L 4.2 4.4 4.7  Chloride 98 - 111 mmol/L 101 108 107  CO2 22 - 32 mmol/L 24 24 25   Calcium 8.9 - 10.3 mg/dL 6.7(M) 0.9(O) 7.8(L)  Total Protein 6.5 - 8.1 g/dL - - -  Total Bilirubin 0.3 - 1.2 mg/dL - - -  Alkaline Phos 38 - 126 U/L - - -  AST 15 - 41 U/L - - -  ALT 14 - 54 U/L - - -    Lab Results  Component Value Date   TSH 0.812 01/16/2017   Lab Results  Component Value Date   ALBUMIN 3.6 02/13/2017   ANIONGAP 8 06/06/2019   Lab Results  Component Value Date   CHOL 92 01/15/2017   CHOL 132 03/02/2012   HDL 36 (L) 01/15/2017   HDL 47 03/02/2012   LDLCALC 37 01/15/2017   LDLCALC 46 03/02/2012   CHOLHDL 2.6 01/15/2017   Lab Results  Component Value Date   TRIG 97 01/15/2017   Lab Results  Component Value Date   HGBA1C 7.7 (H) 02/13/2017   HGBA1C 8.9 (H) 01/15/2017   HGBA1C 8.6 (H) 10/23/2014      ASSESSMENT & PLAN:   Problem List Items Addressed This Visit      Digestive   IBS (irritable bowel syndrome)    Pt has diarrhea from IBS, which she treats with imodium        Endocrine   Diabetes (HCC) (Chronic)    Her blood sugar is 137 today. She takes [place] and insulin and statin.       Hypothyroidism    Thyroid status is stable. She takes her medication regularly.         Musculoskeletal and Integument   Osteoporosis    Pt was advised to take vitamin D on a regular basis, at  least 1000 units PO daily.         Other   S/P AVR (aortic valve replacement)    Pt has a systolic murmur after her aortic valve replacement.       Anxiety    - Patient experiencing high levels of anxiety.  - Encouraged patient to engage in relaxing activities like yoga, meditation, journaling, going for a walk, or participating in a hobby.  - Encouraged patient to reach out to trusted friends or family members about recent struggles      Relevant Medications  ALPRAZolam (XANAX) 0.5 MG tablet   Annual physical exam    Pts current physical is unremarkable. She is known to have CAD without chestpain. She also has aortic valve replacement, valvular murmur or aortic stenosis. She does not have any further episode of TIA or syncope since her valve surgery. Her platelet ct is 87,000, so I asked her to stop taking Aspirin. She is interested in getting her flu vaccine. She is seeing neurology for neuropathy. She was recommended to see an eye doctor. Her memory loss has been stable. She knows the time and distance and name of the president.       Other Visit Diagnoses    Need for influenza vaccination    -  Primary   Relevant Orders   Flu Vaccine QUAD High Dose(Fluad) (Completed)      Meds ordered this encounter  Medications  . ALPRAZolam (XANAX) 0.5 MG tablet    Sig: Take 1 tablet (0.5 mg total) by mouth 2 (two) times daily.    Dispense:  60 tablet    Refill:  0    Follow-up: No follow-ups on file.    Dr. Woodroe Chen [facility] [address], [place], [place] [address]   By signing my name below, I, [employer], attest that this documentation has been prepared under the direction and in the presence of Corky Downs, MD. Electronically Signed: Corky Downs, MD 03/02/20, 3:26 PM   I personally performed the services described in this documentation, which was SCRIBED in my presence. The recorded information has been reviewed and considered accurate. It has been  edited as necessary during review. Corky Downs, MD

## 2020-03-02 NOTE — Assessment & Plan Note (Signed)
Pt was advised to take vitamin D on a regular basis, at least 1000 units PO daily.

## 2020-03-02 NOTE — Assessment & Plan Note (Signed)
Pt has a systolic murmur after her aortic valve replacement.

## 2020-03-02 NOTE — Telephone Encounter (Signed)
Pt's husband, Mellody Dance ( ok per dpr) returned my call and we reviewed results.   He verbalized understanding. Pt will keep up coming f/u in Nov as scheduled.

## 2020-03-02 NOTE — Telephone Encounter (Signed)
I called pt. No answer, left a message asking pt to call me back.   

## 2020-04-04 ENCOUNTER — Other Ambulatory Visit: Payer: Self-pay | Admitting: Internal Medicine

## 2020-04-25 ENCOUNTER — Other Ambulatory Visit: Payer: Self-pay

## 2020-04-25 ENCOUNTER — Ambulatory Visit (INDEPENDENT_AMBULATORY_CARE_PROVIDER_SITE_OTHER): Payer: Medicare Other | Admitting: Internal Medicine

## 2020-04-25 ENCOUNTER — Encounter: Payer: Self-pay | Admitting: Internal Medicine

## 2020-04-25 VITALS — BP 141/83 | HR 82 | Ht 60.0 in | Wt 123.2 lb

## 2020-04-25 DIAGNOSIS — I10 Essential (primary) hypertension: Secondary | ICD-10-CM | POA: Diagnosis not present

## 2020-04-25 DIAGNOSIS — R2681 Unsteadiness on feet: Secondary | ICD-10-CM | POA: Diagnosis not present

## 2020-04-25 DIAGNOSIS — R270 Ataxia, unspecified: Secondary | ICD-10-CM | POA: Diagnosis not present

## 2020-04-25 DIAGNOSIS — R410 Disorientation, unspecified: Secondary | ICD-10-CM | POA: Diagnosis not present

## 2020-04-25 DIAGNOSIS — F419 Anxiety disorder, unspecified: Secondary | ICD-10-CM

## 2020-04-25 DIAGNOSIS — Z952 Presence of prosthetic heart valve: Secondary | ICD-10-CM

## 2020-04-25 MED ORDER — ALPRAZOLAM 0.5 MG PO TABS: 0.5000 mg | ORAL_TABLET | Freq: Two times a day (BID) | ORAL | 0 refills | Status: DC

## 2020-04-25 NOTE — Assessment & Plan Note (Signed)
-   Patient experiencing high levels of anxiety.  - Encouraged patient to engage in relaxing activities like yoga, meditation, journaling, going for a walk, or participating in a hobby.  - Encouraged patient to reach out to trusted friends or family members about recent struggles 

## 2020-04-25 NOTE — Assessment & Plan Note (Signed)
-   Today, the patient's blood pressure is well managed on present  treatment. - The patient will continue the current treatment regimen.  - I encouraged the patient to eat a low-sodium diet to help control blood pressure. - I encouraged the patient to live an active lifestyle and complete activities that increases heart rate to 85% target heart rate at least 5 times per week for one hour.     

## 2020-04-25 NOTE — Assessment & Plan Note (Signed)
Patient is very ataxic on today's examination.  I will schedule her to see a neurologist as well as she needed physical therapy to help her walk.  Told her husband that she needs to use a walker all the time to walk in the house

## 2020-04-25 NOTE — Progress Notes (Addendum)
Established Patient Office Visit  Subjective:  Patient ID: Jenny [name], female    DOB: 1945-07-13  Age: 74 y.o. MRN: [id number]  CC:  Chief Complaint  Patient presents with  . Anxiety    Patient is here today for her xanax refill    HPI  Jenny [name] presents for ataxia and frequent falls.  She has a problem with mild memory loss also has been falling frequently.  She does not drive.  Past Medical History:  Diagnosis Date  . Anxiety   . Arthritis   . CAD (coronary artery disease)    a. 01/2017 Cath: nonobs dzs.  . Chronic systolic CHF (congestive heart failure) (HCC)    a. TTE 12/2016, EF 45-50%, probable hypokinesis of the mid apical anteroseptal, anterior, & apical myocardium, GR2DD, possibly bicuspid aortic valve that was moderately thickened w/ severely calcified leaflets, severe aortic stenosis w/ mean gradient 47 mmHg, valve area 0.52 cm, mild MR, mildly dilated LA  . Depression   . Diabetes mellitus with complication (HCC)    Tylenol  . Hyperlipidemia   . Hypothyroidism   . IBS (irritable bowel syndrome)   . Iron deficiency anemia    a. s/p pRBC x1 in 12/2016  . Microcytic anemia   . Osteoporosis   . Panic attacks   . Severe aortic stenosis    a. TTE 12/2016: possibly bicuspid aortic valve that was moderately thickened with severely calcified leaflets. There was severe aortic stenosis with a mean gradient of 47 mmHg and valve area of 0.52 cm; b. 01/2017 s/p AVR w/ 19 mm bioprosthetic Magna Ease pericardial tissue valve, ser# [id number].  . Stroke (HCC) 2001  . TIA (transient ischemic attack) 2006    Past Surgical History:  Procedure Laterality Date  . ABDOMINAL HYSTERECTOMY    . AORTIC VALVE REPLACEMENT N/A 02/17/2017   Procedure: AORTIC VALVE REPLACEMENT (AVR);  Surgeon: Donata Clay, Theron Arista, MD;  Location: [facility] OR;  Service: Open Heart Surgery;  Laterality: N/A;  . CATARACT EXTRACTION W/PHACO Right 04/09/2016   Procedure: CATARACT EXTRACTION PHACO AND  INTRAOCULAR LENS PLACEMENT (IOC);  Surgeon: Galen Manila, MD;  Location: [facility] ORS;  Service: Ophthalmology;  Laterality: Right;  [place] 57.2 AP% 18.6 CDE 10.64 Fluid Pack lot # [id number]  . CATARACT EXTRACTION W/PHACO Left 12/29/2018   Procedure: CATARACT EXTRACTION PHACO AND INTRAOCULAR LENS PLACEMENT (IOC)  LEFT DIABETIC;  Surgeon: Galen Manila, MD;  Location: [facility];  Service: Ophthalmology;  Laterality: Left;  Diabetic - insuli and oral meds  . CHOLECYSTECTOMY    . COLONOSCOPY    . CORONARY ANGIOGRAPHY N/A 02/03/2017   Procedure: CORONARY ANGIOGRAPHY;  Surgeon: Iran Ouch, MD;  Location: [facility];  Service: Cardiovascular;  Laterality: N/A;  . FRACTURE SURGERY     LEFT ANKLE  . RIGHT AND LEFT HEART CATH Bilateral 02/03/2017   Procedure: Right and Left Heart Cath;  Surgeon: Iran Ouch, MD;  Location: [facility];  Service: Cardiovascular;  Laterality: Bilateral;  . TEE WITHOUT CARDIOVERSION N/A 02/17/2017   Procedure: TRANSESOPHAGEAL ECHOCARDIOGRAM (TEE);  Surgeon: Donata Clay, Theron Arista, MD;  Location: [facility] OR;  Service: Open Heart Surgery;  Laterality: N/A;  . TIBIA IM NAIL INSERTION Left 05/19/2018   Procedure: INTRAMEDULLARY (IM) NAIL TIBIAL;  Surgeon: Juanell Fairly, MD;  Location: [facility] OR[facility];  Service: Orthopedics;  Laterality: Left;    Family History  Problem Relation Age of Onset  . Hypertension Mother     Social History  Socioeconomic History  . Marital status: Married    Spouse name: Not on file  . Number of children: Not on file  . Years of education: Not on file  . Highest education level: Not on file  Occupational History  . Not on file  Tobacco Use  . Smoking status: Former Smoker    Years: 30.00    Quit date: 02/04/2000    Years since quitting: 20.2  . Smokeless tobacco: Never Used  Vaping Use  . Vaping Use: Never used  Substance and Sexual Activity  . Alcohol use: No  . Drug use: No  . Sexual activity: Not Currently   Other Topics Concern  . Not on file  Social History Narrative  . Not on file   Social Determinants of Health   Financial Resource Strain:   . Difficulty of Paying Living Expenses: Not on file  Food Insecurity:   . Worried About Programme researcher, broadcasting/film/video in the Last Year: Not on file  . Ran Out of Food in the Last Year: Not on file  Transportation Needs:   . Lack of Transportation (Medical): Not on file  . Lack of Transportation (Non-Medical): Not on file  Physical Activity:   . Days of Exercise per Week: Not on file  . Minutes of Exercise per Session: Not on file  Stress:   . Feeling of Stress : Not on file  Social Connections:   . Frequency of Communication with Friends and Family: Not on file  . Frequency of Social Gatherings with Friends and Family: Not on file  . Attends Religious Services: Not on file  . Active Member of Clubs or Organizations: Not on file  . Attends Banker Meetings: Not on file  . Marital Status: Not on file  Intimate Partner Violence:   . Fear of Current or Ex-Partner: Not on file  . Emotionally Abused: Not on file  . Physically Abused: Not on file  . Sexually Abused: Not on file     Current Outpatient Medications:  .  alendronate (FOSAMAX) 70 MG tablet, Take 70 mg by mouth once a week. Take with a full glass of water on an empty stomach., Disp: , Rfl:  .  ALPRAZolam (XANAX) 0.5 MG tablet, Take 1 tablet (0.5 mg total) by mouth 2 (two) times daily., Disp: 60 tablet, Rfl: 0 .  aspirin EC 81 MG tablet, Take 81 mg by mouth daily., Disp: , Rfl:  .  Cholecalciferol (VITAMIN D3 PO), Take by mouth daily., Disp: , Rfl:  .  hydrOXYzine (ATARAX/VISTARIL) 25 MG tablet, Take 25 mg by mouth 2 (two) times daily. , Disp: , Rfl:  .  insulin glargine (LANTUS) 100 UNIT/ML injection, Inject 25-35 Units into the skin See admin instructions. 35 units every morning and 25 units at bedtime, Disp: , Rfl:  .  levothyroxine (SYNTHROID, LEVOTHROID) 88 MCG tablet, Take  88 mcg by mouth daily., Disp: , Rfl:  .  Loperamide HCl (IMODIUM A-D PO), Take by mouth as needed., Disp: , Rfl:  .  Melatonin 10 MG TABS, Take by mouth at bedtime., Disp: , Rfl:  .  metFORMIN (GLUCOPHAGE) 500 MG tablet, Take 500 mg by mouth 2 (two) times daily., Disp: , Rfl:  .  Phenylephrine-DM-GG (MUCINEX FAST-MAX CONGEST COUGH PO), Take by mouth as needed., Disp: , Rfl:  .  rosuvastatin (CRESTOR) 10 MG tablet, TAKE 1 TABLET BY MOUTH DAILY, Disp: 90 tablet, Rfl: 3 .  sertraline (ZOLOFT) 100 MG tablet, TAKE 1  TABLET BY MOUTH TWICE DAILY, Disp: 180 tablet, Rfl: 2 .  sitaGLIPtin (JANUVIA) 100 MG tablet, Take 100 mg by mouth daily., Disp: , Rfl:   Current Facility-Administered Medications:  .  lidocaine (PF) (XYLOCAINE) 1 % injection 2 mL, 2 mL, Intradermal, Once, Glorian Mcdonell, MD .  triamcinolone acetonide (KENALOG-40) injection 40 mg, 40 mg, Intramuscular, Once, Corky Downs, MD   Allergies  Allergen Reactions  . Iodine Swelling    ANGIOEDEMA FACIAL SWELLING "BETADINE OKAY"  . Shellfish Allergy Anaphylaxis    ROS Review of Systems  Constitutional: Positive for fatigue. Negative for chills.  HENT: Negative.  Negative for facial swelling, postnasal drip and sneezing.   Eyes: Negative.  Negative for pain.  Respiratory: Negative.  Negative for choking and shortness of breath.   Cardiovascular: Negative.   Gastrointestinal: Negative.   Endocrine: Negative.  Negative for polydipsia.  Genitourinary: Negative.  Negative for pelvic pain.  Musculoskeletal: Positive for gait problem. Negative for neck pain.  Skin: Negative.   Allergic/Immunologic: Negative.   Hematological: Negative.   Psychiatric/Behavioral: Negative.   All other systems reviewed and are negative.     Objective:    Physical Exam Vitals reviewed.  Constitutional:      Appearance: Normal appearance.  HENT:     Mouth/Throat:     Mouth: Mucous membranes are moist.  Eyes:     Pupils: Pupils are equal, round,  and reactive to light.  Neck:     Vascular: No carotid bruit.  Cardiovascular:     Rate and Rhythm: Normal rate and regular rhythm.     Pulses: Normal pulses.     Heart sounds: Normal heart sounds.  Pulmonary:     Effort: Pulmonary effort is normal.     Breath sounds: Normal breath sounds.  Abdominal:     General: Bowel sounds are normal.     Palpations: Abdomen is soft. There is no hepatomegaly, splenomegaly or mass.     Tenderness: There is no abdominal tenderness.     Hernia: No hernia is present.  Musculoskeletal:        General: No tenderness.     Cervical back: Neck supple.     Right lower leg: No edema.     Left lower leg: No edema.  Skin:    Findings: No rash.  Neurological:     Mental Status: She is alert and oriented to person, place, and time.     Motor: No weakness.  Psychiatric:        Mood and Affect: Mood and affect normal.        Behavior: Behavior normal.     BP (!) 141/83   Pulse 82   Ht 5' (1.524 m)   Wt 123 lb 3.2 oz (55.9 kg)   BMI 24.06 kg/m  Wt Readings from Last 3 Encounters:  04/25/20 123 lb 3.2 oz (55.9 kg)  03/02/20 134 lb 1.6 oz (60.8 kg)  01/24/20 134 lb 1.6 oz (60.8 kg)     Health Maintenance Due  Topic Date Due  . Hepatitis C Screening  Never done  . FOOT EXAM  Never done  . OPHTHALMOLOGY EXAM  Never done  . URINE MICROALBUMIN  Never done  . COVID-19 Vaccine (1) Never done  . TETANUS/TDAP  Never done  . MAMMOGRAM  Never done  . COLONOSCOPY  Never done  . DEXA SCAN  Never done  . HEMOGLOBIN A1C  08/16/2017  . PNA vac Low Risk Adult (2 of 2 - PCV13) 05/23/2019  There are no preventive care reminders to display for this patient.  Lab Results  Component Value Date   TSH 0.812 01/16/2017   Lab Results  Component Value Date   WBC 4.1 01/17/2020   HGB 12.9 01/17/2020   HCT 39.1 01/17/2020   MCV 87.9 01/17/2020   PLT 87 (L) 01/17/2020   Lab Results  Component Value Date   NA 133 (L) 06/06/2019   K 4.2 06/06/2019    CO2 24 06/06/2019   GLUCOSE 164 (H) 06/06/2019   BUN 22 06/06/2019   CREATININE 1.01 (H) 06/06/2019   BILITOT 0.5 02/13/2017   ALKPHOS 99 02/13/2017   AST 53 (H) 02/13/2017   ALT 25 02/13/2017   PROT 7.8 02/13/2017   ALBUMIN 3.6 02/13/2017   CALCIUM 8.8 (L) 06/06/2019   ANIONGAP 8 06/06/2019   Lab Results  Component Value Date   CHOL 92 01/15/2017   Lab Results  Component Value Date   HDL 36 (L) 01/15/2017   Lab Results  Component Value Date   LDLCALC 37 01/15/2017   Lab Results  Component Value Date   TRIG 97 01/15/2017   Lab Results  Component Value Date   CHOLHDL 2.6 01/15/2017   Lab Results  Component Value Date   HGBA1C 7.7 (H) 02/13/2017      Assessment & Plan:   Problem List Items Addressed This Visit      Cardiovascular and Mediastinum   Essential hypertension - Primary    - Today, the patient's blood pressure is well managed on present treatment. - The patient will continue the current treatment regimen.  - I encouraged the patient to eat a low-sodium diet to help control blood pressure. - I encouraged the patient to live an active lifestyle and complete activities that increases heart rate to 85% target heart rate at least 5 times per week for one hour.            Other   S/P AVR (aortic valve replacement)    Patient denies any chest pain or shortness of breath.  Chest is clear      Anxiety    - Patient experiencing high levels of anxiety.  - Encouraged patient to engage in relaxing activities like yoga, meditation, journaling, going for a walk, or participating in a hobby.  - Encouraged patient to reach out to trusted friends or family members about recent struggles       Relevant Medications   ALPRAZolam (XANAX) 0.5 MG tablet   Ataxia    Patient is very ataxic on today's examination.  I will schedule her to see a neurologist as well as she needed physical therapy to help her walk.  Told her husband that she needs to use a walker all  the time to walk in the house       Other Visit Diagnoses    Unsteady gait       Relevant Orders   Ambulatory referral to Physical Therapy   Confusion       Relevant Orders   POCT urinalysis dipstick      Meds ordered this encounter  Medications  . DISCONTD: ALPRAZolam (XANAX) 0.5 MG tablet    Sig: Take 1 tablet (0.5 mg total) by mouth 2 (two) times daily.    Dispense:  60 tablet    Refill:  0  . ALPRAZolam (XANAX) 0.5 MG tablet    Sig: Take 1 tablet (0.5 mg total) by mouth 2 (two) times daily.    Dispense:  60 tablet    Refill:  0    Follow-up: No follow-ups on file.    Corky Downs, MD

## 2020-04-25 NOTE — Addendum Note (Signed)
Addended by: Corky Downs on: 04/25/2020 04:33 PM   Modules accepted: Orders

## 2020-04-25 NOTE — Assessment & Plan Note (Signed)
Patient denies any chest pain or shortness of breath.  Chest is clear

## 2020-04-25 NOTE — Addendum Note (Signed)
Addended by: Jobie Quaker on: 04/25/2020 03:29 PM   Modules accepted: Orders

## 2020-04-27 ENCOUNTER — Other Ambulatory Visit: Payer: Self-pay | Admitting: *Deleted

## 2020-04-27 DIAGNOSIS — N390 Urinary tract infection, site not specified: Secondary | ICD-10-CM

## 2020-04-27 MED ORDER — CIPROFLOXACIN HCL 500 MG PO TABS: 500.0000 mg | ORAL_TABLET | Freq: Two times a day (BID) | ORAL | 0 refills | Status: DC

## 2020-04-29 LAB — URINE CULTURE
MICRO NUMBER:: 11131426
SPECIMEN QUALITY:: ADEQUATE

## 2020-05-08 ENCOUNTER — Ambulatory Visit: Payer: Medicare Other | Admitting: Neurology

## 2020-05-23 ENCOUNTER — Encounter: Payer: Self-pay | Admitting: Neurology

## 2020-05-23 ENCOUNTER — Ambulatory Visit (INDEPENDENT_AMBULATORY_CARE_PROVIDER_SITE_OTHER): Payer: Medicare Other | Admitting: Neurology

## 2020-05-23 VITALS — BP 146/77 | HR 91 | Ht 59.5 in | Wt 123.0 lb

## 2020-05-23 DIAGNOSIS — D696 Thrombocytopenia, unspecified: Secondary | ICD-10-CM

## 2020-05-23 DIAGNOSIS — F39 Unspecified mood [affective] disorder: Secondary | ICD-10-CM | POA: Diagnosis not present

## 2020-05-23 DIAGNOSIS — E038 Other specified hypothyroidism: Secondary | ICD-10-CM

## 2020-05-23 DIAGNOSIS — G6289 Other specified polyneuropathies: Secondary | ICD-10-CM

## 2020-05-23 DIAGNOSIS — F419 Anxiety disorder, unspecified: Secondary | ICD-10-CM

## 2020-05-23 DIAGNOSIS — R413 Other amnesia: Secondary | ICD-10-CM

## 2020-05-23 DIAGNOSIS — E063 Autoimmune thyroiditis: Secondary | ICD-10-CM

## 2020-05-23 MED ORDER — DONEPEZIL HCL 10 MG PO TABS: 5.0000 mg | ORAL_TABLET | Freq: Every day | ORAL | 3 refills | Status: DC
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
## 2020-05-24 DIAGNOSIS — Z23 Encounter for immunization: Secondary | ICD-10-CM | POA: Diagnosis not present

## 2020-05-30 ENCOUNTER — Telehealth: Payer: Self-pay

## 2020-05-30 DIAGNOSIS — D890 Polyclonal hypergammaglobulinemia: Secondary | ICD-10-CM

## 2020-05-30 DIAGNOSIS — R768 Other specified abnormal immunological findings in serum: Secondary | ICD-10-CM

## 2020-05-30 NOTE — Progress Notes (Signed)
Please call patient and advise her that her blood test showed a positive rheumatoid factor which can be seen in patients with arthritis, particularly rheumatoid arthritis.  If she is agreeable, I would like to get input from her rheumatologist.  In addition, one of her liver enzymes called AST was mildly elevated, this can be seen in the context of fatty liver, medication effect, also sometimes from excessive or regular alcohol consumption.  I would recommend that she have a follow-up appointment with her primary care physician to have this followed in the next 3 to 4 months.  In addition, her protein electrophoresis which looks at the different immunoglobulins of the blood showed an increase in IgA.  This does not necessarily represent a significant abnormality but may be worth an evaluation through hematology, especially in light of her history of neuropathy.  Please inquire if she is agreeable to a rheumatology and hematology referral. Indication for rheumatology referral is positive rheumatoid factor, history of arthritis.  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

## 2020-05-30 NOTE — Telephone Encounter (Signed)
I called pt, spoke with pt's husband Mellody Dance, per DPR. I discussed pt's blood work results and recommendations. Pt's husband is agreeable to a hematology and rheumatology referral and prefers it to be in [place], if possible. Pt verbalized understanding of results. Pt had no questions at this time but was encouraged to call back if questions arise.

## 2020-05-30 NOTE — Telephone Encounter (Signed)
-----   Message from Huston Foley, MD sent at 05/30/2020 12:38 PM EST ----- Please call patient and advise her that her blood test showed a positive rheumatoid factor which can be seen in patients with arthritis, particularly rheumatoid arthritis.  If she is agreeable, I would like to get input from her rheumatologist.  In addition, one of her liver enzymes called AST was mildly elevated, this can be seen in the context of fatty liver, medication effect, also sometimes from excessive or regular alcohol consumption.  I would recommend that she have a follow-up appointment with her primary care physician to have this followed in the next 3 to 4 months.  In addition, her protein electrophoresis which looks at the different immunoglobulins of the blood showed an increase in IgA.  This does not necessarily represent a significant abnormality but may be worth an evaluation through hematology, especially in light of her history of neuropathy.  Please inquire if she is agreeable to a rheumatology and hematology referral. Indication for rheumatology referral is positive rheumatoid factor, history of arthritis.  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

## 2020-05-31 LAB — COMPREHENSIVE METABOLIC PANEL
ALT: 22 IU/L (ref 0–32)
AST: 43 IU/L — ABNORMAL HIGH (ref 0–40)
Albumin/Globulin Ratio: 1.2 (ref 1.2–2.2)
Albumin: 3.9 g/dL (ref 3.7–4.7)
Alkaline Phosphatase: 95 IU/L (ref 44–121)
BUN/Creatinine Ratio: 12 (ref 12–28)
BUN: 10 mg/dL (ref 8–27)
Bilirubin Total: 0.4 mg/dL (ref 0.0–1.2)
CO2: 24 mmol/L (ref 20–29)
Calcium: 9.5 mg/dL (ref 8.7–10.3)
Chloride: 102 mmol/L (ref 96–106)
Creatinine, Ser: 0.83 mg/dL (ref 0.57–1.00)
GFR calc Af Amer: 82 mL/min/{1.73_m2} (ref >59)
GFR calc non Af Amer: 71 mL/min/{1.73_m2} (ref >59)
Globulin, Total: 3.3 g/dL (ref 1.5–4.5)
Glucose: 67 mg/dL (ref 65–99)
Potassium: 4.8 mmol/L (ref 3.5–5.2)
Sodium: 138 mmol/L (ref 134–144)
Total Protein: 7.2 g/dL (ref 6.0–8.5)

## 2020-05-31 LAB — SEDIMENTATION RATE: Sed Rate: 24 mm/hr (ref 0–40)

## 2020-05-31 LAB — MULTIPLE MYELOMA PANEL, SERUM
Albumin SerPl Elph-Mcnc: 3.3 g/dL (ref 2.9–4.4)
Albumin/Glob SerPl: 0.9 (ref 0.7–1.7)
Alpha 1: 0.3 g/dL (ref 0.0–0.4)
Alpha2 Glob SerPl Elph-Mcnc: 0.8 g/dL (ref 0.4–1.0)
B-Globulin SerPl Elph-Mcnc: 1.2 g/dL (ref 0.7–1.3)
Gamma Glob SerPl Elph-Mcnc: 1.6 g/dL (ref 0.4–1.8)
Globulin, Total: 3.9 g/dL (ref 2.2–3.9)
IgA/Immunoglobulin A, Serum: 464 mg/dL — ABNORMAL HIGH (ref 64–422)
IgG (Immunoglobin G), Serum: 1556 mg/dL (ref 586–1602)
IgM (Immunoglobulin M), Srm: 160 mg/dL (ref 26–217)

## 2020-05-31 LAB — HGB A1C W/O EAG: Hgb A1c MFr Bld: 5.6 % (ref 4.8–5.6)

## 2020-05-31 LAB — B12 AND FOLATE PANEL
Folate: >20 ng/mL (ref >3.0)
Vitamin B-12: 933 pg/mL (ref 232–1245)

## 2020-05-31 LAB — ANA W/REFLEX: Anti Nuclear Antibody (ANA): NEGATIVE

## 2020-05-31 LAB — TSH: TSH: 2.45 u[IU]/mL (ref 0.450–4.500)

## 2020-05-31 LAB — C-REACTIVE PROTEIN: CRP: 6 mg/L (ref 0–10)

## 2020-05-31 LAB — RPR: RPR Ser Ql: NONREACTIVE

## 2020-05-31 LAB — VITAMIN B1: Thiamine: 156.7 nmol/L (ref 66.5–200.0)

## 2020-05-31 LAB — RHEUMATOID FACTOR: Rheumatoid fact SerPl-aCnc: 17.6 IU/mL — ABNORMAL HIGH (ref <14.0)

## 2020-06-05 ENCOUNTER — Ambulatory Visit (INDEPENDENT_AMBULATORY_CARE_PROVIDER_SITE_OTHER): Payer: Medicare Other | Admitting: Internal Medicine

## 2020-06-05 ENCOUNTER — Other Ambulatory Visit: Payer: Self-pay

## 2020-06-05 ENCOUNTER — Encounter: Payer: Self-pay | Admitting: Internal Medicine

## 2020-06-05 VITALS — BP 141/84 | HR 82 | Ht 59.5 in | Wt 119.6 lb

## 2020-06-05 DIAGNOSIS — M069 Rheumatoid arthritis, unspecified: Secondary | ICD-10-CM | POA: Diagnosis not present

## 2020-06-05 DIAGNOSIS — E119 Type 2 diabetes mellitus without complications: Secondary | ICD-10-CM

## 2020-06-05 DIAGNOSIS — E063 Autoimmune thyroiditis: Secondary | ICD-10-CM | POA: Diagnosis not present

## 2020-06-05 DIAGNOSIS — Z794 Long term (current) use of insulin: Secondary | ICD-10-CM | POA: Diagnosis not present

## 2020-06-05 DIAGNOSIS — F419 Anxiety disorder, unspecified: Secondary | ICD-10-CM

## 2020-06-05 DIAGNOSIS — I10 Essential (primary) hypertension: Secondary | ICD-10-CM

## 2020-06-05 DIAGNOSIS — K589 Irritable bowel syndrome without diarrhea: Secondary | ICD-10-CM | POA: Diagnosis not present

## 2020-06-05 DIAGNOSIS — E038 Other specified hypothyroidism: Secondary | ICD-10-CM | POA: Diagnosis not present

## 2020-06-05 MED ORDER — ALPRAZOLAM 0.5 MG PO TABS: 0.5000 mg | ORAL_TABLET | Freq: Two times a day (BID) | ORAL | 0 refills | Status: DC

## 2020-06-05 NOTE — Assessment & Plan Note (Signed)
-   Today, the patient's blood pressure is well managed on diet. - The patient will continue the current treatment regimen.  - I encouraged the patient to eat a low-sodium diet to help control blood pressure. - I encouraged the patient to live an active lifestyle and complete activities that increases heart rate to 85% target heart rate at least 5 times per week for one hour.     

## 2020-06-05 NOTE — Assessment & Plan Note (Signed)
Diabetes is stable on on Metformin and Januvia.

## 2020-06-05 NOTE — Assessment & Plan Note (Signed)
Hypothyroidism is stable on Synthroid 88 mcg p.o. daily

## 2020-06-05 NOTE — Assessment & Plan Note (Signed)
IBS is resolved and the patient has lost more than 20 pounds of weight.

## 2020-06-05 NOTE — Progress Notes (Signed)
Established Patient Office Visit  Subjective:  Patient ID: Jenny [name], female    DOB: 1946/01/30  Age: 74 y.o. MRN: [id number]  CC:  Chief Complaint  Patient presents with  . Anxiety    xanax refill   . Hypertension  . Diabetes    patient's blood sugar was 75    Anxiety    Hypertension Associated symptoms include anxiety.  Diabetes    Jenny [name] presents for general checkup.  Past Medical History:  Diagnosis Date  . Anxiety   . Arthritis   . CAD (coronary artery disease)    a. 01/2017 Cath: nonobs dzs.  . Chronic systolic CHF (congestive heart failure) (HCC)    a. TTE 12/2016, EF 45-50%, probable hypokinesis of the mid apical anteroseptal, anterior, & apical myocardium, GR2DD, possibly bicuspid aortic valve that was moderately thickened w/ severely calcified leaflets, severe aortic stenosis w/ mean gradient 47 mmHg, valve area 0.52 cm, mild MR, mildly dilated LA  . Depression   . Diabetes mellitus with complication (HCC)    Tylenol  . Hyperlipidemia   . Hypothyroidism   . IBS (irritable bowel syndrome)   . Iron deficiency anemia    a. s/p pRBC x1 in 12/2016  . Microcytic anemia   . Osteoporosis   . Panic attacks   . Severe aortic stenosis    a. TTE 12/2016: possibly bicuspid aortic valve that was moderately thickened with severely calcified leaflets. There was severe aortic stenosis with a mean gradient of 47 mmHg and valve area of 0.52 cm; b. 01/2017 s/p AVR w/ 19 mm bioprosthetic Magna Ease pericardial tissue valve, ser# [id number].  . Stroke (HCC) 2001  . TIA (transient ischemic attack) 2006    Past Surgical History:  Procedure Laterality Date  . ABDOMINAL HYSTERECTOMY    . AORTIC VALVE REPLACEMENT N/A 02/17/2017   Procedure: AORTIC VALVE REPLACEMENT (AVR);  Surgeon: Donata Clay, Theron Arista, MD;  Location: [facility] OR;  Service: Open Heart Surgery;  Laterality: N/A;  . CATARACT EXTRACTION W/PHACO Right 04/09/2016   Procedure: CATARACT EXTRACTION PHACO AND  INTRAOCULAR LENS PLACEMENT (IOC);  Surgeon: Galen Manila, MD;  Location: [facility] ORS;  Service: Ophthalmology;  Laterality: Right;  [place] 57.2 AP% 18.6 CDE 10.64 Fluid Pack lot # [id number]  . CATARACT EXTRACTION W/PHACO Left 12/29/2018   Procedure: CATARACT EXTRACTION PHACO AND INTRAOCULAR LENS PLACEMENT (IOC)  LEFT DIABETIC;  Surgeon: Galen Manila, MD;  Location: [facility];  Service: Ophthalmology;  Laterality: Left;  Diabetic - insuli and oral meds  . CHOLECYSTECTOMY    . COLONOSCOPY    . CORONARY ANGIOGRAPHY N/A 02/03/2017   Procedure: CORONARY ANGIOGRAPHY;  Surgeon: Iran Ouch, MD;  Location: [facility];  Service: Cardiovascular;  Laterality: N/A;  . FRACTURE SURGERY     LEFT ANKLE  . RIGHT AND LEFT HEART CATH Bilateral 02/03/2017   Procedure: Right and Left Heart Cath;  Surgeon: Iran Ouch, MD;  Location: [facility] INV[facility] CV LAB;  Service: Cardiovascular;  Laterality: Bilateral;  . TEE WITHOUT CARDIOVERSION N/A 02/17/2017   Procedure: TRANSESOPHAGEAL ECHOCARDIOGRAM (TEE);  Surgeon: Donata Clay, Theron Arista, MD;  Location: [facility] OR;  Service: Open Heart Surgery;  Laterality: N/A;  . TIBIA IM NAIL INSERTION Left 05/19/2018   Procedure: INTRAMEDULLARY (IM) NAIL TIBIAL;  Surgeon: Juanell Fairly, MD;  Location: [facility] ORS;  Service: Orthopedics;  Laterality: Left;    Family History  Problem Relation Age of Onset  . Hypertension Mother     Social History  Socioeconomic History  . Marital status: Married    Spouse name: Not on file  . Number of children: Not on file  . Years of education: Not on file  . Highest education level: Not on file  Occupational History  . Not on file  Tobacco Use  . Smoking status: Former Smoker    Years: 30.00    Quit date: 02/04/2000    Years since quitting: 20.3  . Smokeless tobacco: Never Used  Vaping Use  . Vaping Use: Never used  Substance and Sexual Activity  . Alcohol use: No  . Drug use: No  . Sexual activity: Not Currently   Other Topics Concern  . Not on file  Social History Narrative  . Not on file   Social Determinants of Health   Financial Resource Strain:   . Difficulty of Paying Living Expenses: Not on file  Food Insecurity:   . Worried About Programme researcher, broadcasting/film/video in the Last Year: Not on file  . Ran Out of Food in the Last Year: Not on file  Transportation Needs:   . Lack of Transportation (Medical): Not on file  . Lack of Transportation (Non-Medical): Not on file  Physical Activity:   . Days of Exercise per Week: Not on file  . Minutes of Exercise per Session: Not on file  Stress:   . Feeling of Stress : Not on file  Social Connections:   . Frequency of Communication with Friends and Family: Not on file  . Frequency of Social Gatherings with Friends and Family: Not on file  . Attends Religious Services: Not on file  . Active Member of Clubs or Organizations: Not on file  . Attends Banker Meetings: Not on file  . Marital Status: Not on file  Intimate Partner Violence:   . Fear of Current or Ex-Partner: Not on file  . Emotionally Abused: Not on file  . Physically Abused: Not on file  . Sexually Abused: Not on file     Current Outpatient Medications:  .  alendronate (FOSAMAX) 70 MG tablet, Take 70 mg by mouth once a week. Take with a full glass of water on an empty stomach., Disp: , Rfl:  .  ALPRAZolam (XANAX) 0.5 MG tablet, Take 1 tablet (0.5 mg total) by mouth 2 (two) times daily., Disp: 60 tablet, Rfl: 0 .  Cholecalciferol (VITAMIN D3 PO), Take by mouth daily., Disp: , Rfl:  .  donepezil (ARICEPT) 10 MG tablet, Take 0.5 tablets (5 mg total) by mouth at bedtime. Take 1/2 pill each evening., Disp: 15 tablet, Rfl: 3 .  hydrOXYzine (ATARAX/VISTARIL) 25 MG tablet, Take 25 mg by mouth 2 (two) times daily. , Disp: , Rfl:  .  insulin glargine (LANTUS) 100 UNIT/ML injection, Inject 25-35 Units into the skin See admin instructions. 35 units every morning and 25 units at bedtime, Disp:  , Rfl:  .  levothyroxine (SYNTHROID, LEVOTHROID) 88 MCG tablet, Take 88 mcg by mouth daily., Disp: , Rfl:  .  Loperamide HCl (IMODIUM A-D PO), Take by mouth as needed., Disp: , Rfl:  .  Melatonin 10 MG TABS, Take by mouth at bedtime., Disp: , Rfl:  .  metFORMIN (GLUCOPHAGE) 500 MG tablet, Take 500 mg by mouth 2 (two) times daily., Disp: , Rfl:  .  Phenylephrine-DM-GG (MUCINEX FAST-MAX CONGEST COUGH PO), Take by mouth as needed., Disp: , Rfl:  .  rosuvastatin (CRESTOR) 10 MG tablet, TAKE 1 TABLET BY MOUTH DAILY, Disp: 90 tablet, Rfl:  3 .  sertraline (ZOLOFT) 100 MG tablet, TAKE 1 TABLET BY MOUTH TWICE DAILY, Disp: 180 tablet, Rfl: 2 .  sitaGLIPtin (JANUVIA) 100 MG tablet, Take 100 mg by mouth daily., Disp: , Rfl:   Current Facility-Administered Medications:  .  lidocaine (PF) (XYLOCAINE) 1 % injection 2 mL, 2 mL, Intradermal, Once, Emmry Hinsch, MD .  triamcinolone acetonide (KENALOG-40) injection 40 mg, 40 mg, Intramuscular, Once, Corky Downs, MD   Allergies  Allergen Reactions  . Iodine Swelling    ANGIOEDEMA FACIAL SWELLING "BETADINE OKAY"  . Shellfish Allergy Anaphylaxis    ROS Review of Systems  Constitutional: Negative.   HENT: Negative.   Eyes: Negative.   Respiratory: Negative.   Cardiovascular: Negative.   Gastrointestinal: Negative.   Endocrine: Negative.   Genitourinary: Negative.   Musculoskeletal: Negative.   Skin: Negative.   Allergic/Immunologic: Negative.   Neurological: Negative.   Hematological: Negative.   Psychiatric/Behavioral: Negative.   All other systems reviewed and are negative.     Objective:    Physical Exam Vitals reviewed.  Constitutional:      Appearance: Normal appearance.  HENT:     Mouth/Throat:     Mouth: Mucous membranes are moist.  Eyes:     Pupils: Pupils are equal, round, and reactive to light.  Neck:     Vascular: No carotid bruit.  Cardiovascular:     Rate and Rhythm: Normal rate and regular rhythm.     Pulses:  Normal pulses.     Heart sounds: Normal heart sounds.  Pulmonary:     Effort: Pulmonary effort is normal.     Breath sounds: Normal breath sounds.  Abdominal:     General: Bowel sounds are normal.     Palpations: Abdomen is soft. There is no hepatomegaly, splenomegaly or mass.     Tenderness: There is no abdominal tenderness.     Hernia: No hernia is present.  Musculoskeletal:        General: No tenderness.     Cervical back: Neck supple.     Right lower leg: No edema.     Left lower leg: No edema.  Skin:    Findings: No rash.  Neurological:     Mental Status: She is alert and oriented to person, place, and time.     Motor: No weakness.  Psychiatric:        Mood and Affect: Mood and affect normal.        Behavior: Behavior normal.     BP (!) 141/84   Pulse 82   Ht 4' 11.5" (1.511 m)   Wt 119 lb 9.3 oz (54.2 kg)   BMI 23.75 kg/m  Wt Readings from Last 3 Encounters:  06/05/20 119 lb 9.3 oz (54.2 kg)  05/23/20 123 lb (55.8 kg)  04/25/20 123 lb 3.2 oz (55.9 kg)     Health Maintenance Due  Topic Date Due  . Hepatitis C Screening  Never done  . FOOT EXAM  Never done  . OPHTHALMOLOGY EXAM  Never done  . URINE MICROALBUMIN  Never done  . COVID-19 Vaccine (1) Never done  . TETANUS/TDAP  Never done  . MAMMOGRAM  Never done  . COLONOSCOPY  Never done  . DEXA SCAN  Never done  . PNA vac Low Risk Adult (2 of 2 - PCV13) 05/23/2019    There are no preventive care reminders to display for this patient.  Lab Results  Component Value Date   TSH 2.450 05/23/2020   Lab Results  Component Value Date   WBC 4.1 01/17/2020   HGB 12.9 01/17/2020   HCT 39.1 01/17/2020   MCV 87.9 01/17/2020   PLT 87 (L) 01/17/2020   Lab Results  Component Value Date   NA 138 05/23/2020   K 4.8 05/23/2020   CO2 24 05/23/2020   GLUCOSE 67 05/23/2020   BUN 10 05/23/2020   CREATININE 0.83 05/23/2020   BILITOT 0.4 05/23/2020   ALKPHOS 95 05/23/2020   AST 43 (H) 05/23/2020   ALT 22  05/23/2020   PROT 7.2 05/23/2020   ALBUMIN 3.9 05/23/2020   CALCIUM 9.5 05/23/2020   ANIONGAP 8 06/06/2019   Lab Results  Component Value Date   CHOL 92 01/15/2017   Lab Results  Component Value Date   HDL 36 (L) 01/15/2017   Lab Results  Component Value Date   LDLCALC 37 01/15/2017   Lab Results  Component Value Date   TRIG 97 01/15/2017   Lab Results  Component Value Date   CHOLHDL 2.6 01/15/2017   Lab Results  Component Value Date   HGBA1C 5.6 05/23/2020      Assessment & Plan:   Problem List Items Addressed This Visit      Cardiovascular and Mediastinum   Essential hypertension    - Today, the patient's blood pressure is well managed on diet. - The patient will continue   the current treatment regimen.  - I encouraged the patient to eat a low-sodium diet to help control blood pressure. - I encouraged the patient to live an active lifestyle and complete activities that increases heart rate to 85% target heart rate at least 5 times per week for one hour.            Digestive   IBS (irritable bowel syndrome)    IBS is resolved and the patient has lost more than 20 pounds of weight.        Endocrine   Diabetes (HCC) (Chronic)    Diabetes is stable on on Metformin and Januvia.      Hypothyroidism - Primary    Hypothyroidism is stable on Synthroid 88 mcg p.o. daily        Other   Anxiety    - Patient experiencing high levels of anxiety.  - Encouraged patient to engage in relaxing activities like yoga, meditation, journaling, going for a walk, or participating in a hobby.  - Encouraged patient to reach out to trusted friends or family members about recent struggles       Relevant Medications   ALPRAZolam (XANAX) 0.5 MG tablet      Meds ordered this encounter  Medications  . ALPRAZolam (XANAX) 0.5 MG tablet    Sig: Take 1 tablet (0.5 mg total) by mouth 2 (two) times daily.    Dispense:  60 tablet    Refill:  0    Follow-up: No  follow-ups on file.  Lab data reviewed, revealed low platelet count and mildly elevated LFT.  It has to be repeated in 3 months.  Patient also has a positive rheumatoid factor.  And elevated IgA.  Neurologist recommended patient be seen by rheumatologist, and hematologist.   Corky Downs, MD

## 2020-06-05 NOTE — Progress Notes (Signed)
Established Patient Office Visit  Subjective:  Patient ID: Jenny [name], female    DOB: 1945-12-14  Age: 74 y.o. MRN: [id number]  CC:  Chief Complaint  Patient presents with  . Follow-up    RM 2, with husband    Neurologic Problem The patient's primary symptoms include a loss of balance and memory loss. The patient's pertinent negatives include no near-syncope. This is a chronic problem. The current episode started more than 1 year ago. The neurological problem developed insidiously. The problem has been waxing and waning since onset. Associated symptoms include vertigo. Pertinent negatives include no abdominal pain, back pain, bladder incontinence, bowel incontinence, diaphoresis, dizziness, fatigue, nausea, neck pain or palpitations.    Jenny [name] presents for  Evaluation of memory loss  Past Medical History:  Diagnosis Date  . Anxiety   . Arthritis   . CAD (coronary artery disease)    a. 01/2017 Cath: nonobs dzs.  . Chronic systolic CHF (congestive heart failure) ([place])    a. TTE 12/2016, EF 45-50%, probable hypokinesis of the mid apical anteroseptal, anterior, & apical myocardium, GR2DD, possibly bicuspid aortic valve that was moderately thickened w/ severely calcified leaflets, severe aortic stenosis w/ mean gradient 47 mmHg, valve area 0.52 cm, mild MR, mildly dilated LA  . Depression   . Diabetes mellitus with complication (HCC)    Tylenol  . Hyperlipidemia   . Hypothyroidism   . IBS (irritable bowel syndrome)   . Iron deficiency anemia    a. s/p pRBC x1 in 12/2016  . Microcytic anemia   . Osteoporosis   . Panic attacks   . Severe aortic stenosis    a. TTE 12/2016: possibly bicuspid aortic valve that was moderately thickened with severely calcified leaflets. There was severe aortic stenosis with a mean gradient of 47 mmHg and valve area of 0.52 cm; b. 01/2017 s/p AVR w/ 19 mm bioprosthetic Magna Ease pericardial tissue valve, ser# [id number].  . Stroke ([place]) 2001   . TIA (transient ischemic attack) 2006    Past Surgical History:  Procedure Laterality Date  . ABDOMINAL HYSTERECTOMY    . AORTIC VALVE REPLACEMENT N/A 02/17/2017   Procedure: AORTIC VALVE REPLACEMENT (AVR);  Surgeon: Prescott Gum, Collier Salina, MD;  Location: [place];  Service: Open Heart Surgery;  Laterality: N/A;  . CATARACT EXTRACTION W/PHACO Right 04/09/2016   Procedure: CATARACT EXTRACTION PHACO AND INTRAOCULAR LENS PLACEMENT (IOC);  Surgeon: Birder Robson, MD;  Location: [facility] ORS;  Service: Ophthalmology;  Laterality: Right;  [place] 57.2 AP% 18.6 CDE 10.64 Fluid Pack lot # [id number]  . CATARACT EXTRACTION W/PHACO Left 12/29/2018   Procedure: CATARACT EXTRACTION PHACO AND INTRAOCULAR LENS PLACEMENT ([place])  LEFT DIABETIC;  Surgeon: Birder Robson, MD;  Location: [place];  Service: Ophthalmology;  Laterality: Left;  Diabetic - insuli and oral meds  . CHOLECYSTECTOMY    . COLONOSCOPY    . CORONARY ANGIOGRAPHY N/A 02/03/2017   Procedure: CORONARY ANGIOGRAPHY;  Surgeon: Wellington Hampshire, MD;  Location: [place] CV LAB;  Service: Cardiovascular;  Laterality: N/A;  . FRACTURE SURGERY     LEFT ANKLE  . RIGHT AND LEFT HEART CATH Bilateral 02/03/2017   Procedure: Right and Left Heart Cath;  Surgeon: Wellington Hampshire, MD;  Location: [place] [facility];  Service: Cardiovascular;  Laterality: Bilateral;  . TEE WITHOUT CARDIOVERSION N/A 02/17/2017   Procedure: TRANSESOPHAGEAL ECHOCARDIOGRAM (TEE);  Surgeon: Prescott Gum, Collier Salina, MD;  Location: [place];  Service: Open Heart Surgery;  Laterality: N/A;  .  TIBIA IM NAIL INSERTION Left 05/19/2018   Procedure: INTRAMEDULLARY (IM) NAIL TIBIAL;  Surgeon: Thornton Park, MD;  Location: [facility] ORS;  Service: Orthopedics;  Laterality: Left;    Family History  Problem Relation Age of Onset  . Hypertension Mother     Social History   Socioeconomic History  . Marital status: Married    Spouse name: Not on file  . Number of children: Not on file  .  Years of education: Not on file  . Highest education level: Not on file  Occupational History  . Not on file  Tobacco Use  . Smoking status: Former Smoker    Years: 30.00    Quit date: 02/04/2000    Years since quitting: 20.3  . Smokeless tobacco: Never Used  Vaping Use  . Vaping Use: Never used  Substance and Sexual Activity  . Alcohol use: No  . Drug use: No  . Sexual activity: Not Currently  Other Topics Concern  . Not on file  Social History Narrative  . Not on file   Social Determinants of Health   Financial Resource Strain: Not on file  Food Insecurity: Not on file  Transportation Needs: Not on file  Physical Activity: Not on file  Stress: Not on file  Social Connections: Not on file  Intimate Partner Violence: Not on file     Current Outpatient Medications:  .  alendronate (FOSAMAX) 70 MG tablet, Take 70 mg by mouth once a week. Take with a full glass of water on an empty stomach., Disp: , Rfl:  .  Cholecalciferol (VITAMIN D3 PO), Take by mouth daily., Disp: , Rfl:  .  hydrOXYzine (ATARAX/VISTARIL) 25 MG tablet, Take 25 mg by mouth 2 (two) times daily. , Disp: , Rfl:  .  insulin glargine (LANTUS) 100 UNIT/ML injection, Inject 25-35 Units into the skin See admin instructions. 35 units every morning and 25 units at bedtime, Disp: , Rfl:  .  levothyroxine (SYNTHROID, LEVOTHROID) 88 MCG tablet, Take 88 mcg by mouth daily., Disp: , Rfl:  .  Loperamide HCl (IMODIUM A-D PO), Take by mouth as needed., Disp: , Rfl:  .  Melatonin 10 MG TABS, Take by mouth at bedtime., Disp: , Rfl:  .  metFORMIN (GLUCOPHAGE) 500 MG tablet, Take 500 mg by mouth 2 (two) times daily., Disp: , Rfl:  .  Phenylephrine-DM-GG (MUCINEX FAST-MAX CONGEST COUGH PO), Take by mouth as needed., Disp: , Rfl:  .  rosuvastatin (CRESTOR) 10 MG tablet, TAKE 1 TABLET BY MOUTH DAILY, Disp: 90 tablet, Rfl: 3 .  sertraline (ZOLOFT) 100 MG tablet, TAKE 1 TABLET BY MOUTH TWICE DAILY, Disp: 180 tablet, Rfl: 2 .   sitaGLIPtin (JANUVIA) 100 MG tablet, Take 100 mg by mouth daily., Disp: , Rfl:  .  ALPRAZolam (XANAX) 0.5 MG tablet, Take 1 tablet (0.5 mg total) by mouth 2 (two) times daily., Disp: 60 tablet, Rfl: 0 .  donepezil (ARICEPT) 10 MG tablet, Take 0.5 tablets (5 mg total) by mouth at bedtime. Take 1/2 pill each evening., Disp: 15 tablet, Rfl: 3  Current Facility-Administered Medications:  .  lidocaine (PF) (XYLOCAINE) 1 % injection 2 mL, 2 mL, Intradermal, Once, Jillyn Stacey, MD .  triamcinolone acetonide (KENALOG-40) injection 40 mg, 40 mg, Intramuscular, Once, Cletis Athens, MD   Allergies  Allergen Reactions  . Iodine Swelling    ANGIOEDEMA FACIAL SWELLING "BETADINE OKAY"  . Shellfish Allergy Anaphylaxis    ROS Review of Systems  Constitutional: Negative for diaphoresis and fatigue.  HENT: Negative for hearing loss and mouth sores.   Eyes: Negative for pain.  Cardiovascular: Negative for palpitations and near-syncope.  Gastrointestinal: Negative for abdominal pain, bowel incontinence and nausea.  Endocrine: Negative for polydipsia.  Genitourinary: Negative for bladder incontinence.  Musculoskeletal: Negative for back pain and neck pain.  Allergic/Immunologic: Negative for food allergies.  Neurological: Positive for vertigo and loss of balance. Negative for dizziness, tremors and numbness.  Psychiatric/Behavioral: Positive for memory loss. Negative for agitation.      Objective:    Physical Exam Vitals reviewed.  Constitutional:      Appearance: Normal appearance.  HENT:     Mouth/Throat:     Mouth: Mucous membranes are moist.  Eyes:     Pupils: Pupils are equal, round, and reactive to light.  Neck:     Vascular: No carotid bruit.  Cardiovascular:     Rate and Rhythm: Normal rate and regular rhythm.     Pulses: Normal pulses.     Heart sounds: Murmur heard.    Pulmonary:     Effort: Pulmonary effort is normal.     Breath sounds: Normal breath sounds.  Abdominal:      General: Bowel sounds are normal.     Palpations: Abdomen is soft. There is no hepatomegaly, splenomegaly or mass.     Tenderness: There is no abdominal tenderness.     Hernia: No hernia is present.  Musculoskeletal:        General: No tenderness.     Cervical back: Neck supple.     Right lower leg: No edema.     Left lower leg: No edema.  Skin:    Findings: No rash.  Neurological:     Mental Status: She is alert and oriented to person, place, and time.     Motor: No weakness.  Psychiatric:        Mood and Affect: Mood and affect normal.        Behavior: Behavior normal.     BP (!) 146/77   Pulse 91   Ht 4' 11.5" (1.511 m)   Wt 123 lb (55.8 kg)   BMI 24.43 kg/m  Wt Readings from Last 3 Encounters:  06/05/20 119 lb 9.3 oz (54.2 kg)  05/23/20 123 lb (55.8 kg)  04/25/20 123 lb 3.2 oz (55.9 kg)     Health Maintenance Due  Topic Date Due  . Hepatitis C Screening  Never done  . FOOT EXAM  Never done  . OPHTHALMOLOGY EXAM  Never done  . URINE MICROALBUMIN  Never done  . COVID-19 Vaccine (1) Never done  . TETANUS/TDAP  Never done  . MAMMOGRAM  Never done  . COLONOSCOPY  Never done  . DEXA SCAN  Never done  . PNA vac Low Risk Adult (2 of 2 - PCV13) 05/23/2019    There are no preventive care reminders to display for this patient.  Lab Results  Component Value Date   TSH 2.450 05/23/2020   Lab Results  Component Value Date   WBC 4.1 01/17/2020   HGB 12.9 01/17/2020   HCT 39.1 01/17/2020   MCV 87.9 01/17/2020   PLT 87 (L) 01/17/2020   Lab Results  Component Value Date   NA 138 05/23/2020   K 4.8 05/23/2020   CO2 24 05/23/2020   GLUCOSE 67 05/23/2020   BUN 10 05/23/2020   CREATININE 0.83 05/23/2020   BILITOT 0.4 05/23/2020   ALKPHOS 95 05/23/2020   AST 43 (H) 05/23/2020   ALT  22 05/23/2020   PROT 7.2 05/23/2020   ALBUMIN 3.9 05/23/2020   CALCIUM 9.5 05/23/2020   ANIONGAP 8 06/06/2019   Lab Results  Component Value Date   CHOL 92 01/15/2017    Lab Results  Component Value Date   HDL 36 (L) 01/15/2017   Lab Results  Component Value Date   LDLCALC 37 01/15/2017   Lab Results  Component Value Date   TRIG 97 01/15/2017   Lab Results  Component Value Date   CHOLHDL 2.6 01/15/2017   Lab Results  Component Value Date   HGBA1C 5.6 05/23/2020      Assessment & Plan:   Problem List Items Addressed This Visit      Endocrine   Hypothyroidism    Stable with the present medication.        Nervous and Auditory   Axonal neuropathy    Neuropathy is stable at the present time. Her B12 level is normal. Sed rate is normal. She does not be referred to hematology      Relevant Orders   Hgb A1c w/o eAG (Completed)   ANA w/Reflex (Completed)   C-reactive protein (Completed)   Rheumatoid factor (Completed)   Multiple Myeloma Panel (SPEP&IFE w/QIG) (Completed)   TSH (Completed)   Sedimentation rate (Completed)   RPR (Completed)   Comprehensive metabolic panel (Completed)   B12 and Folate Panel (Completed)   Vitamin B1 (Completed)     Other   Mood disorder (HCC)    Stable at the present time according to the husband      Relevant Orders   Hgb A1c w/o eAG (Completed)   ANA w/Reflex (Completed)   C-reactive protein (Completed)   Rheumatoid factor (Completed)   Multiple Myeloma Panel (SPEP&IFE w/QIG) (Completed)   TSH (Completed)   Sedimentation rate (Completed)   RPR (Completed)   Comprehensive metabolic panel (Completed)   B12 and Folate Panel (Completed)   Vitamin B1 (Completed)   Anxiety    - Patient experiencing high levels of anxiety.  - Encouraged patient to engage in relaxing activities like yoga, meditation, journaling, going for a walk, or participating in a hobby.  - Encouraged patient to reach out to trusted friends or family members about recent struggles       Memory loss - Primary    Patient is seen by neurologist she will also see a hematologist and arthritis specialist for abnormal labs.       Relevant Orders   Hgb A1c w/o eAG (Completed)   ANA w/Reflex (Completed)   C-reactive protein (Completed)   Rheumatoid factor (Completed)   Multiple Myeloma Panel (SPEP&IFE w/QIG) (Completed)   TSH (Completed)   Sedimentation rate (Completed)   RPR (Completed)   Comprehensive metabolic panel (Completed)   B12 and Folate Panel (Completed)   Vitamin B1 (Completed)   Thrombocytopenia (HCC)    We will follow thrombocytopenia also will be followed up with hematologist         Meds ordered this encounter  Medications  . donepezil (ARICEPT) 10 MG tablet    Sig: Take 0.5 tablets (5 mg total) by mouth at bedtime. Take 1/2 pill each evening.    Dispense:  15 tablet    Refill:  3    Follow-up  rheumotology and haematology    Cletis Athens, MD

## 2020-06-05 NOTE — Assessment & Plan Note (Signed)
-   Patient experiencing high levels of anxiety.  - Encouraged patient to engage in relaxing activities like yoga, meditation, journaling, going for a walk, or participating in a hobby.  - Encouraged patient to reach out to trusted friends or family members about recent struggles 

## 2020-06-06 NOTE — Addendum Note (Signed)
Addended by: Jobie Quaker on: 06/06/2020 03:59 PM   Modules accepted: Orders

## 2020-06-18 DIAGNOSIS — G6289 Other specified polyneuropathies: Secondary | ICD-10-CM | POA: Insufficient documentation

## 2020-06-18 DIAGNOSIS — R413 Other amnesia: Secondary | ICD-10-CM | POA: Insufficient documentation

## 2020-06-18 DIAGNOSIS — D696 Thrombocytopenia, unspecified: Secondary | ICD-10-CM | POA: Insufficient documentation

## 2020-06-18 NOTE — Assessment & Plan Note (Signed)
Stable at the present time according to the husband

## 2020-06-18 NOTE — Assessment & Plan Note (Signed)
Neuropathy is stable at the present time. Her B12 level is normal. Sed rate is normal. She does not be referred to hematology

## 2020-06-18 NOTE — Assessment & Plan Note (Addendum)
Patient is seen by neurologist she will also see a hematologist and arthritis specialist for abnormal labs.

## 2020-06-18 NOTE — Assessment & Plan Note (Signed)
We will follow thrombocytopenia also will be followed up with hematologist

## 2020-06-18 NOTE — Assessment & Plan Note (Signed)
Stable with the present medication.

## 2020-06-18 NOTE — Assessment & Plan Note (Signed)
-   Patient experiencing high levels of anxiety.  - Encouraged patient to engage in relaxing activities like yoga, meditation, journaling, going for a walk, or participating in a hobby.  - Encouraged patient to reach out to trusted friends or family members about recent struggles 

## 2020-06-19 ENCOUNTER — Telehealth: Payer: Self-pay | Admitting: Neurology

## 2020-06-19 NOTE — Telephone Encounter (Addendum)
Patient has declined referral patient's Husband is aware .

## 2020-07-04 ENCOUNTER — Other Ambulatory Visit: Payer: Self-pay

## 2020-07-04 ENCOUNTER — Ambulatory Visit (INDEPENDENT_AMBULATORY_CARE_PROVIDER_SITE_OTHER): Payer: Medicare Other | Admitting: Internal Medicine

## 2020-07-04 ENCOUNTER — Encounter: Payer: Self-pay | Admitting: Internal Medicine

## 2020-07-04 VITALS — BP 139/87 | HR 84 | Ht 59.0 in | Wt 122.3 lb

## 2020-07-04 DIAGNOSIS — Z952 Presence of prosthetic heart valve: Secondary | ICD-10-CM

## 2020-07-04 DIAGNOSIS — E063 Autoimmune thyroiditis: Secondary | ICD-10-CM | POA: Diagnosis not present

## 2020-07-04 DIAGNOSIS — K589 Irritable bowel syndrome without diarrhea: Secondary | ICD-10-CM

## 2020-07-04 DIAGNOSIS — E119 Type 2 diabetes mellitus without complications: Secondary | ICD-10-CM

## 2020-07-04 DIAGNOSIS — F419 Anxiety disorder, unspecified: Secondary | ICD-10-CM | POA: Diagnosis not present

## 2020-07-04 DIAGNOSIS — E038 Other specified hypothyroidism: Secondary | ICD-10-CM

## 2020-07-04 DIAGNOSIS — Z794 Long term (current) use of insulin: Secondary | ICD-10-CM

## 2020-07-04 DIAGNOSIS — I10 Essential (primary) hypertension: Secondary | ICD-10-CM

## 2020-07-04 LAB — GLUCOSE, POCT (MANUAL RESULT ENTRY): POC Glucose: 138 mg/dl — AB (ref 70–99)

## 2020-07-04 MED ORDER — ALPRAZOLAM 0.5 MG PO TABS: 0.5000 mg | ORAL_TABLET | Freq: Two times a day (BID) | ORAL | 0 refills | Status: DC

## 2020-07-04 NOTE — Assessment & Plan Note (Signed)
IBS is stable at the present time she denies any bloating or abdominal pain

## 2020-07-04 NOTE — Assessment & Plan Note (Signed)
Thyroid status is normal.  Patient complains of loss of hair she was referred to dermatologist

## 2020-07-04 NOTE — Assessment & Plan Note (Signed)
-   Patient experiencing high levels of anxiety.  - Encouraged patient to engage in relaxing activities like yoga, meditation, journaling, going for a walk, or participating in a hobby.  - Encouraged patient to reach out to trusted friends or family members about recent struggles 

## 2020-07-04 NOTE — Progress Notes (Signed)
Established Patient Office Visit  Subjective:  Patient ID: Jenny [name], female    DOB: Mar 04, 1946  Age: 75 y.o. MRN: [id number]  CC:  Chief Complaint  Patient presents with  . Anxiety    Patient is here for her medication refill    HPI  Jenny [name] presents for for general checkup.  She denies any chest pain or shortness of breath.  Neurology report was reviewed with the patient and she was suggested to have a follow-up with rheumatology and keep her appointment in [place] as suggested by the neurologist.  She complains of hair loss and I suggested that she should see a skin specialist.  Her thyroid status is normal.  Her IgA level is elevated, she takes Xanax for anxiety and panic disorder also has a history of stroke in the past.  She has aortic valve replacement with a bioprosthesis  Past Medical History:  Diagnosis Date  . Anxiety   . Arthritis   . CAD (coronary artery disease)    a. 01/2017 Cath: nonobs dzs.  . Chronic systolic CHF (congestive heart failure) (HCC)    a. TTE 12/2016, EF 45-50%, probable hypokinesis of the mid apical anteroseptal, anterior, & apical myocardium, GR2DD, possibly bicuspid aortic valve that was moderately thickened w/ severely calcified leaflets, severe aortic stenosis w/ mean gradient 47 mmHg, valve area 0.52 cm, mild MR, mildly dilated LA  . Depression   . Diabetes mellitus with complication (HCC)    Tylenol  . Hyperlipidemia   . Hypothyroidism   . IBS (irritable bowel syndrome)   . Iron deficiency anemia    a. s/p pRBC x1 in 12/2016  . Microcytic anemia   . Osteoporosis   . Panic attacks   . Severe aortic stenosis    a. TTE 12/2016: possibly bicuspid aortic valve that was moderately thickened with severely calcified leaflets. There was severe aortic stenosis with a mean gradient of 47 mmHg and valve area of 0.52 cm; b. 01/2017 s/p AVR w/ 19 mm bioprosthetic Magna Ease pericardial tissue valve, ser# [id number].  . Stroke (HCC) 2001  .  TIA (transient ischemic attack) 2006    Past Surgical History:  Procedure Laterality Date  . ABDOMINAL HYSTERECTOMY    . AORTIC VALVE REPLACEMENT N/A 02/17/2017   Procedure: AORTIC VALVE REPLACEMENT (AVR);  Surgeon: Donata Clay, Theron Arista, MD;  Location: [facility] OR;  Service: Open Heart Surgery;  Laterality: N/A;  . CATARACT EXTRACTION W/PHACO Right 04/09/2016   Procedure: CATARACT EXTRACTION PHACO AND INTRAOCULAR LENS PLACEMENT (IOC);  Surgeon: Galen Manila, MD;  Location: [facility] ORS;  Service: Ophthalmology;  Laterality: Right;  [place] 57.2 AP% 18.6 CDE 10.64 Fluid Pack lot # [id number]  . CATARACT EXTRACTION W/PHACO Left 12/29/2018   Procedure: CATARACT EXTRACTION PHACO AND INTRAOCULAR LENS PLACEMENT (IOC)  LEFT DIABETIC;  Surgeon: Galen Manila, MD;  Location: [facility];  Service: Ophthalmology;  Laterality: Left;  Diabetic - insuli and oral meds  . CHOLECYSTECTOMY    . COLONOSCOPY    . CORONARY ANGIOGRAPHY N/A 02/03/2017   Procedure: CORONARY ANGIOGRAPHY;  Surgeon: Iran Ouch, MD;  Location: [facility] INVASIVE [facility] LAB;  Service: Cardiovascular;  Laterality: N/A;  . FRACTURE SURGERY     LEFT ANKLE  . RIGHT AND LEFT HEART CATH Bilateral 02/03/2017   Procedure: Right and Left Heart Cath;  Surgeon: Iran Ouch, MD;  Location: [facility] INVASIVE CV LAB;  Service: Cardiovascular;  Laterality: Bilateral;  . TEE WITHOUT CARDIOVERSION N/A 02/17/2017   Procedure: TRANSESOPHAGEAL ECHOCARDIOGRAM (  TEE);  Surgeon: Kerin Perna, MD;  Location: [facility] OR;  Service: Open Heart Surgery;  Laterality: N/A;  . TIBIA IM NAIL INSERTION Left 05/19/2018   Procedure: INTRAMEDULLARY (IM) NAIL TIBIAL;  Surgeon: Juanell Fairly, MD;  Location: [facility] ORS;  Service: Orthopedics;  Laterality: Left;    Family History  Problem Relation Age of Onset  . Hypertension Mother     Social History   Socioeconomic History  . Marital status: Married    Spouse name: Not on file  . Number of children: Not on file  . Years  of education: Not on file  . Highest education level: Not on file  Occupational History  . Not on file  Tobacco Use  . Smoking status: Former Smoker    Years: 30.00    Quit date: 02/04/2000    Years since quitting: 20.4  . Smokeless tobacco: Never Used  Vaping Use  . Vaping Use: Never used  Substance and Sexual Activity  . Alcohol use: No  . Drug use: No  . Sexual activity: Not Currently  Other Topics Concern  . Not on file  Social History Narrative  . Not on file   Social Determinants of Health   Financial Resource Strain: Not on file  Food Insecurity: Not on file  Transportation Needs: Not on file  Physical Activity: Not on file  Stress: Not on file  Social Connections: Not on file  Intimate Partner Violence: Not on file     Current Outpatient Medications:  .  alendronate (FOSAMAX) 70 MG tablet, Take 70 mg by mouth once a week. Take with a full glass of water on an empty stomach., Disp: , Rfl:  .  ALPRAZolam (XANAX) 0.5 MG tablet, Take 1 tablet (0.5 mg total) by mouth 2 (two) times daily., Disp: 60 tablet, Rfl: 0 .  Cholecalciferol (VITAMIN D3 PO), Take by mouth daily., Disp: , Rfl:  .  donepezil (ARICEPT) 10 MG tablet, Take 0.5 tablets (5 mg total) by mouth at bedtime. Take 1/2 pill each evening., Disp: 15 tablet, Rfl: 3 .  hydrOXYzine (ATARAX/VISTARIL) 25 MG tablet, Take 25 mg by mouth 2 (two) times daily. , Disp: , Rfl:  .  insulin glargine (LANTUS) 100 UNIT/ML injection, Inject 25-35 Units into the skin See admin instructions. 35 units every morning and 25 units at bedtime, Disp: , Rfl:  .  levothyroxine (SYNTHROID, LEVOTHROID) 88 MCG tablet, Take 88 mcg by mouth daily., Disp: , Rfl:  .  Loperamide HCl (IMODIUM A-D PO), Take by mouth as needed., Disp: , Rfl:  .  Melatonin 10 MG TABS, Take by mouth at bedtime., Disp: , Rfl:  .  metFORMIN (GLUCOPHAGE) 500 MG tablet, Take 500 mg by mouth 2 (two) times daily., Disp: , Rfl:  .  Phenylephrine-DM-GG (MUCINEX FAST-MAX CONGEST  COUGH PO), Take by mouth as needed., Disp: , Rfl:  .  rosuvastatin (CRESTOR) 10 MG tablet, TAKE 1 TABLET BY MOUTH DAILY, Disp: 90 tablet, Rfl: 3 .  sertraline (ZOLOFT) 100 MG tablet, TAKE 1 TABLET BY MOUTH TWICE DAILY, Disp: 180 tablet, Rfl: 2 .  sitaGLIPtin (JANUVIA) 100 MG tablet, Take 100 mg by mouth daily., Disp: , Rfl:   Current Facility-Administered Medications:  .  lidocaine (PF) (XYLOCAINE) 1 % injection 2 mL, 2 mL, Intradermal, Once, Danila Eddie, MD .  triamcinolone acetonide (KENALOG-40) injection 40 mg, 40 mg, Intramuscular, Once, Corky Downs, MD   Allergies  Allergen Reactions  . Iodine Swelling    ANGIOEDEMA FACIAL SWELLING "BETADINE  OKAY"  . Shellfish Allergy Anaphylaxis    ROS Review of Systems  Constitutional: Negative.  Negative for appetite change.  HENT: Negative.   Eyes: Negative.  Negative for itching.  Respiratory: Negative.  Negative for cough, choking, shortness of breath and wheezing.   Cardiovascular: Positive for palpitations. Negative for chest pain and leg swelling.  Gastrointestinal: Negative.   Endocrine: Negative.   Genitourinary: Negative.   Musculoskeletal: Negative for gait problem.  Skin: Negative.  Negative for color change, pallor, rash and wound.  Allergic/Immunologic: Negative.   Hematological: Negative.   Psychiatric/Behavioral: Negative for behavioral problems. The patient is nervous/anxious.   All other systems reviewed and are negative.     Objective:    Physical Exam Cardiovascular:     Rate and Rhythm: Regular rhythm.     Pulses: Normal pulses.  Pulmonary:     Effort: Pulmonary effort is normal. No respiratory distress.     Breath sounds: No wheezing or rales.  Abdominal:     General: Abdomen is flat. There is no distension.     Tenderness: There is no abdominal tenderness.  Skin:    Coloration: Skin is not jaundiced.     Findings: No bruising, lesion or rash.  Psychiatric:        Mood and Affect: Mood normal.      BP 139/87   Pulse 84   Ht 4\' 11"  (1.499 m)   Wt 122 lb 4.8 oz (55.5 kg)   BMI 24.70 kg/m  Wt Readings from Last 3 Encounters:  07/04/20 122 lb 4.8 oz (55.5 kg)  06/05/20 119 lb 9.3 oz (54.2 kg)  05/23/20 123 lb (55.8 kg)     Health Maintenance Due  Topic Date Due  . Hepatitis C Screening  Never done  . FOOT EXAM  Never done  . OPHTHALMOLOGY EXAM  Never done  . URINE MICROALBUMIN  Never done  . COVID-19 Vaccine (1) Never done  . TETANUS/TDAP  Never done  . COLONOSCOPY (Pts 45-47yrs Insurance coverage will need to be confirmed)  Never done  . MAMMOGRAM  Never done  . DEXA SCAN  Never done  . PNA vac Low Risk Adult (2 of 2 - PCV13) 05/23/2019    There are no preventive care reminders to display for this patient.  Lab Results  Component Value Date   TSH 2.450 05/23/2020   Lab Results  Component Value Date   WBC 4.1 01/17/2020   HGB 12.9 01/17/2020   HCT 39.1 01/17/2020   MCV 87.9 01/17/2020   PLT 87 (L) 01/17/2020   Lab Results  Component Value Date   NA 138 05/23/2020   K 4.8 05/23/2020   CO2 24 05/23/2020   GLUCOSE 67 05/23/2020   BUN 10 05/23/2020   CREATININE 0.83 05/23/2020   BILITOT 0.4 05/23/2020   ALKPHOS 95 05/23/2020   AST 43 (H) 05/23/2020   ALT 22 05/23/2020   PROT 7.2 05/23/2020   ALBUMIN 3.9 05/23/2020   CALCIUM 9.5 05/23/2020   ANIONGAP 8 06/06/2019   Lab Results  Component Value Date   CHOL 92 01/15/2017   Lab Results  Component Value Date   HDL 36 (L) 01/15/2017   Lab Results  Component Value Date   LDLCALC 37 01/15/2017   Lab Results  Component Value Date   TRIG 97 01/15/2017   Lab Results  Component Value Date   CHOLHDL 2.6 01/15/2017   Lab Results  Component Value Date   HGBA1C 5.6 05/23/2020  Assessment & Plan:   Problem List Items Addressed This Visit      Cardiovascular and Mediastinum   Essential hypertension    -    - I encouraged the patient to eat a low-sodium diet to help control blood  pressure. - I encouraged the patient to live an active lifestyle and complete activities that increases heart rate to 85% target heart rate at least 5 times per week for one hour.            Digestive   Irritable bowel syndrome    IBS is stable at the present time she denies any bloating or abdominal pain        Endocrine   Diabetes ([place]) - Primary (Chronic)    Diabetes under control on present medication patient has gained about 2 pounds      Relevant Orders   POCT glucose (manual entry) (Completed)   Hypothyroidism    Thyroid status is normal.  Patient complains of loss of hair she was referred to dermatologist        Other   S/P AVR (aortic valve replacement)    Patient has aortic valve replacement with a bioprosthesis she is doing well has a residual heart murmur on the left sternal border.  She has some thrombocytopenia which will follow up on that.      Anxiety    - Patient experiencing high levels of anxiety.  - Encouraged patient to engage in relaxing activities like yoga, meditation, journaling, going for a walk, or participating in a hobby.  - Encouraged patient to reach out to trusted friends or family members about recent struggles       Relevant Medications   ALPRAZolam (XANAX) 0.5 MG tablet      Meds ordered this encounter  Medications  . ALPRAZolam (XANAX) 0.5 MG tablet    Sig: Take 1 tablet (0.5 mg total) by mouth 2 (two) times daily.    Dispense:  60 tablet    Refill:  0    Follow-up: No follow-ups on file.    Cletis Athens, MD

## 2020-07-04 NOTE — Assessment & Plan Note (Signed)
.  -   I encouraged the patient to eat a low-sodium diet to help control blood pressure. - I encouraged the patient to live an active lifestyle and complete activities that increases heart rate to 85% target heart rate at least 5 times per week for one hour.     

## 2020-07-04 NOTE — Assessment & Plan Note (Signed)
Patient has aortic valve replacement with a bioprosthesis she is doing well has a residual heart murmur on the left sternal border.  She has some thrombocytopenia which will follow up on that.

## 2020-07-04 NOTE — Assessment & Plan Note (Signed)
Diabetes under control on present medication patient has gained about 2 pounds

## 2020-07-31 ENCOUNTER — Ambulatory Visit: Payer: Medicare Other | Admitting: Internal Medicine

## 2020-08-01 ENCOUNTER — Ambulatory Visit: Payer: Medicare Other | Admitting: Internal Medicine

## 2020-08-08 ENCOUNTER — Other Ambulatory Visit: Payer: Self-pay

## 2020-08-08 ENCOUNTER — Ambulatory Visit (INDEPENDENT_AMBULATORY_CARE_PROVIDER_SITE_OTHER): Payer: Medicare Other | Admitting: Internal Medicine

## 2020-08-08 ENCOUNTER — Encounter: Payer: Self-pay | Admitting: Internal Medicine

## 2020-08-08 VITALS — BP 149/77 | HR 89 | Ht 59.0 in | Wt 123.5 lb

## 2020-08-08 DIAGNOSIS — I10 Essential (primary) hypertension: Secondary | ICD-10-CM

## 2020-08-08 DIAGNOSIS — F419 Anxiety disorder, unspecified: Secondary | ICD-10-CM | POA: Diagnosis not present

## 2020-08-08 DIAGNOSIS — E063 Autoimmune thyroiditis: Secondary | ICD-10-CM

## 2020-08-08 DIAGNOSIS — M12812 Other specific arthropathies, not elsewhere classified, left shoulder: Secondary | ICD-10-CM

## 2020-08-08 DIAGNOSIS — E038 Other specified hypothyroidism: Secondary | ICD-10-CM

## 2020-08-08 DIAGNOSIS — K589 Irritable bowel syndrome without diarrhea: Secondary | ICD-10-CM | POA: Diagnosis not present

## 2020-08-08 MED ORDER — ALPRAZOLAM 0.5 MG PO TABS: 0.5000 mg | ORAL_TABLET | Freq: Two times a day (BID) | ORAL | 0 refills | Status: DC

## 2020-08-08 NOTE — Assessment & Plan Note (Signed)
-   Patient experiencing high levels of anxiety.  - Encouraged patient to engage in relaxing activities like yoga, meditation, journaling, going for a walk, or participating in a hobby.  - Encouraged patient to reach out to trusted friends or family members about recent struggles 

## 2020-08-08 NOTE — Assessment & Plan Note (Signed)
stroid in jected

## 2020-08-08 NOTE — Assessment & Plan Note (Signed)
Stable  On med

## 2020-08-08 NOTE — Progress Notes (Addendum)
Established Patient Office Visit  Subjective:  Patient ID: Jenny [name], female    DOB: Sep 06, 1945  Age: 75 y.o. MRN: [id number]  CC:  Chief Complaint  Patient presents with  . Diabetes    Patient's blood sugar was 96  . Anxiety    Xanax refill     HPI  Jenny [name] presents for lt shoulde pain, and anxiety, coronary artery disease, aortic valve disease.  Past Medical History:  Diagnosis Date  . Anxiety   . Arthritis   . CAD (coronary artery disease)    a. 01/2017 Cath: nonobs dzs.  . Chronic systolic CHF (congestive heart failure) (HCC)    a. TTE 12/2016, EF 45-50%, probable hypokinesis of the mid apical anteroseptal, anterior, & apical myocardium, GR2DD, possibly bicuspid aortic valve that was moderately thickened w/ severely calcified leaflets, severe aortic stenosis w/ mean gradient 47 mmHg, valve area 0.52 cm, mild MR, mildly dilated LA  . Depression   . Diabetes mellitus with complication (HCC)    Tylenol  . Hyperlipidemia   . Hypothyroidism   . IBS (irritable bowel syndrome)   . Iron deficiency anemia    a. s/p pRBC x1 in 12/2016  . Microcytic anemia   . Osteoporosis   . Panic attacks   . Severe aortic stenosis    a. TTE 12/2016: possibly bicuspid aortic valve that was moderately thickened with severely calcified leaflets. There was severe aortic stenosis with a mean gradient of 47 mmHg and valve area of 0.52 cm; b. 01/2017 s/p AVR w/ 19 mm bioprosthetic Magna Ease pericardial tissue valve, ser# [id number].  . Stroke (HCC) 2001  . TIA (transient ischemic attack) 2006    Past Surgical History:  Procedure Laterality Date  . ABDOMINAL HYSTERECTOMY    . AORTIC VALVE REPLACEMENT N/A 02/17/2017   Procedure: AORTIC VALVE REPLACEMENT (AVR);  Surgeon: Donata Clay, Theron Arista, MD;  Location: [facility] OR;  Service: Open Heart Surgery;  Laterality: N/A;  . CATARACT EXTRACTION W/PHACO Right 04/09/2016   Procedure: CATARACT EXTRACTION PHACO AND INTRAOCULAR LENS PLACEMENT (IOC);   Surgeon: Galen Manila, MD;  Location: [facility] ORS;  Service: Ophthalmology;  Laterality: Right;  [place] 57.2 AP% 18.6 CDE 10.64 Fluid Pack lot # [id number]  . CATARACT EXTRACTION W/PHACO Left 12/29/2018   Procedure: CATARACT EXTRACTION PHACO AND INTRAOCULAR LENS PLACEMENT (IOC)  LEFT DIABETIC;  Surgeon: Galen Manila, MD;  Location: [facility];  Service: Ophthalmology;  Laterality: Left;  Diabetic - insuli and oral meds  . CHOLECYSTECTOMY    . COLONOSCOPY    . CORONARY ANGIOGRAPHY N/A 02/03/2017   Procedure: CORONARY ANGIOGRAPHY;  Surgeon: Iran Ouch, MD;  Location: [facility];  Service: Cardiovascular;  Laterality: N/A;  . FRACTURE SURGERY     LEFT ANKLE  . RIGHT AND LEFT HEART CATH Bilateral 02/03/2017   Procedure: Right and Left Heart Cath;  Surgeon: Iran Ouch, MD;  Location: [facility];  Service: Cardiovascular;  Laterality: Bilateral;  . TEE WITHOUT CARDIOVERSION N/A 02/17/2017   Procedure: TRANSESOPHAGEAL ECHOCARDIOGRAM (TEE);  Surgeon: Donata Clay, Theron Arista, MD;  Location: [facility] OR;  Service: Open Heart Surgery;  Laterality: N/A;  . TIBIA IM NAIL INSERTION Left 05/19/2018   Procedure: INTRAMEDULLARY (IM) NAIL TIBIAL;  Surgeon: Juanell Fairly, MD;  Location: [facility] ORS;  Service: Orthopedics;  Laterality: Left;    Family History  Problem Relation Age of Onset  . Hypertension Mother     Social History   Socioeconomic History  . Marital status:  Married    Spouse name: Not on file  . Number of children: Not on file  . Years of education: Not on file  . Highest education level: Not on file  Occupational History  . Not on file  Tobacco Use  . Smoking status: Former Smoker    Years: 30.00    Quit date: 02/04/2000    Years since quitting: 20.5  . Smokeless tobacco: Never Used  Vaping Use  . Vaping Use: Never used  Substance and Sexual Activity  . Alcohol use: No  . Drug use: No  . Sexual activity: Not Currently  Other Topics Concern  . Not on  file  Social History Narrative  . Not on file   Social Determinants of Health   Financial Resource Strain: Not on file  Food Insecurity: Not on file  Transportation Needs: Not on file  Physical Activity: Not on file  Stress: Not on file  Social Connections: Not on file  Intimate Partner Violence: Not on file     Current Outpatient Medications:  .  alendronate (FOSAMAX) 70 MG tablet, Take 70 mg by mouth once a week. Take with a full glass of water on an empty stomach., Disp: , Rfl:  .  Cholecalciferol (VITAMIN D3 PO), Take by mouth daily., Disp: , Rfl:  .  hydrOXYzine (ATARAX/VISTARIL) 25 MG tablet, Take 25 mg by mouth 2 (two) times daily. , Disp: , Rfl:  .  insulin glargine (LANTUS) 100 UNIT/ML injection, Inject 25-35 Units into the skin See admin instructions. 35 units every morning and 25 units at bedtime, Disp: , Rfl:  .  levothyroxine (SYNTHROID, LEVOTHROID) 88 MCG tablet, Take 88 mcg by mouth daily., Disp: , Rfl:  .  Loperamide HCl (IMODIUM A-D PO), Take by mouth as needed., Disp: , Rfl:  .  Melatonin 10 MG TABS, Take by mouth at bedtime., Disp: , Rfl:  .  metFORMIN (GLUCOPHAGE) 500 MG tablet, Take 500 mg by mouth 2 (two) times daily., Disp: , Rfl:  .  Phenylephrine-DM-GG (MUCINEX FAST-MAX CONGEST COUGH PO), Take by mouth as needed., Disp: , Rfl:  .  rosuvastatin (CRESTOR) 10 MG tablet, TAKE 1 TABLET BY MOUTH DAILY, Disp: 90 tablet, Rfl: 3 .  sertraline (ZOLOFT) 100 MG tablet, TAKE 1 TABLET BY MOUTH TWICE DAILY, Disp: 180 tablet, Rfl: 2 .  sitaGLIPtin (JANUVIA) 100 MG tablet, Take 100 mg by mouth daily., Disp: , Rfl:  .  ALPRAZolam (XANAX) 0.5 MG tablet, Take 1 tablet (0.5 mg total) by mouth 2 (two) times daily., Disp: 60 tablet, Rfl: 0 .  donepezil (ARICEPT) 10 MG tablet, Take 1 tablet (10 mg total) by mouth at bedtime. Take 1/2 pill each evening., Disp: 90 tablet, Rfl: 1  Current Facility-Administered Medications:  .  lidocaine (PF) (XYLOCAINE) 1 % injection 2 mL, 2 mL,  Intradermal, Once, Keaisha Sublette, MD .  triamcinolone acetonide (KENALOG-40) injection 40 mg, 40 mg, Intramuscular, Once, Corky Downs, MD   Allergies  Allergen Reactions  . Iodine Swelling    ANGIOEDEMA FACIAL SWELLING "BETADINE OKAY"  . Shellfish Allergy Anaphylaxis    ROS Review of Systems  Constitutional: Negative.   HENT: Negative.   Eyes: Negative.   Respiratory: Negative.   Cardiovascular: Negative.   Gastrointestinal: Negative.   Endocrine: Negative.   Genitourinary: Negative.   Musculoskeletal: Negative.   Skin: Negative.   Allergic/Immunologic: Negative.   Neurological: Negative.   Hematological: Negative.   Psychiatric/Behavioral: Negative.   All other systems reviewed and are negative.  Objective:    Physical Exam Vitals reviewed.  Constitutional:      Appearance: Normal appearance.  HENT:     Mouth/Throat:     Mouth: Mucous membranes are moist.  Eyes:     Pupils: Pupils are equal, round, and reactive to light.  Neck:     Vascular: No carotid bruit.  Cardiovascular:     Rate and Rhythm: Normal rate and regular rhythm.     Pulses: Normal pulses.     Heart sounds: Normal heart sounds.  Pulmonary:     Effort: Pulmonary effort is normal.     Breath sounds: Normal breath sounds.  Abdominal:     General: Bowel sounds are normal.     Palpations: Abdomen is soft. There is no hepatomegaly, splenomegaly or mass.     Tenderness: There is no abdominal tenderness.     Hernia: No hernia is present.  Musculoskeletal:        General: No tenderness.     Cervical back: Neck supple.     Right lower leg: No edema.     Left lower leg: No edema.     Comments: Painful lt shoulder  Skin:    Findings: No rash.  Neurological:     Mental Status: She is alert and oriented to person, place, and time.     Motor: No weakness.  Psychiatric:        Mood and Affect: Mood and affect normal.        Behavior: Behavior normal.    Painful lt shoulder BP (!) 149/77    Pulse 89   Ht  (1.499 m)   Wt 123 lb 8 oz (56 kg)   BMI 24.94 kg/m  Wt Readings from Last 3 Encounters:  08/24/20 125 lb (56.7 kg)  08/08/20 123 lb 8 oz (56 kg)  07/04/20 122 lb 4.8 oz (55.5 kg)     Health Maintenance Due  Topic Date Due  . Hepatitis C Screening  Never done  . COVID-19 Vaccine (1) Never done  . FOOT EXAM  Never done  . OPHTHALMOLOGY EXAM  Never done  . URINE MICROALBUMIN  Never done  . TETANUS/TDAP  Never done  . COLONOSCOPY (Pts 45-48yrs Insurance coverage will need to be confirmed)  Never done  . MAMMOGRAM  Never done  . DEXA SCAN  Never done  . PNA vac Low Risk Adult (2 of 2 - PCV13) 05/23/2019    There are no preventive care reminders to display for this patient.  Lab Results  Component Value Date   TSH 2.450 05/23/2020   Lab Results  Component Value Date   WBC 4.1 01/17/2020   HGB 12.9 01/17/2020   HCT 39.1 01/17/2020   MCV 87.9 01/17/2020   PLT 87 (L) 01/17/2020   Lab Results  Component Value Date   NA 138 05/23/2020   K 4.8 05/23/2020   CO2 24 05/23/2020   GLUCOSE 67 05/23/2020   BUN 10 05/23/2020   CREATININE 0.83 05/23/2020   BILITOT 0.4 05/23/2020   ALKPHOS 95 05/23/2020   AST 43 (H) 05/23/2020   ALT 22 05/23/2020   PROT 7.2 05/23/2020   ALBUMIN 3.9 05/23/2020   CALCIUM 9.5 05/23/2020   ANIONGAP 8 06/06/2019   Lab Results  Component Value Date   CHOL 92 01/15/2017   Lab Results  Component Value Date   HDL 36 (L) 01/15/2017   Lab Results  Component Value Date   LDLCALC 37 01/15/2017   Lab Results  Component Value Date   TRIG 97 01/15/2017   Lab Results  Component Value Date   CHOLHDL 2.6 01/15/2017   Lab Results  Component Value Date   HGBA1C 5.6 05/23/2020      Assessment & Plan:   Problem List Items Addressed This Visit      Cardiovascular and Mediastinum   Essential hypertension - Primary     - I encouraged the patient to eat a low-sodium diet to help control blood pressure. - I encouraged  the patient to live an active lifestyle and complete activities that increases heart rate to 85% target heart rate at least 5 times per week for one hour.            Digestive   Irritable bowel syndrome    stable        Endocrine   Hypothyroidism    Stable  On med        Musculoskeletal and Integument   Rotator cuff arthropathy of left shoulder    stroid in jected      Relevant Orders   Joint Injection/Arthrocentesis (Completed)     Other   Anxiety    - Patient experiencing high levels of anxiety.  - Encouraged patient to engage in relaxing activities like yoga, meditation, journaling, going for a walk, or participating in a hobby.  - Encouraged patient to reach out to trusted friends or family members about recent struggles       Relevant Medications   ALPRAZolam (XANAX) 0.5 MG tablet     Joint Injection/Arthrocentesis  Date/Time: 08/08/2020 3:06 PM Performed by: Corky Downs, MD Authorized by: Corky Downs, MD  Indications: pain  Body area: shoulder Joint: left shoulder Local anesthesia used: yes  Anesthesia: Local anesthesia used: yes Local Anesthetic: lidocaine 1% without epinephrine  Sedation: Patient sedated: no  Needle size: 22 G Ultrasound guidance: no Approach: superior Aspirate: clear Triamcinolone amount: 40 mg Patient tolerance: patient tolerated the procedure well with no immediate complications Comments: Left acromioclavicular joint bursa loss injected with 1% lidocaine 1 cc plus Kenalog 40 mg/cc 1 cc patient received injection in the left supra acromial bursa and she tolerated the procedure well.      Follow-up: No follow-ups on file.    Corky Downs, MD

## 2020-08-08 NOTE — Assessment & Plan Note (Signed)
.  -   I encouraged the patient to eat a low-sodium diet to help control blood pressure. - I encouraged the patient to live an active lifestyle and complete activities that increases heart rate to 85% target heart rate at least 5 times per week for one hour.     

## 2020-08-08 NOTE — Assessment & Plan Note (Signed)
stable °

## 2020-08-24 ENCOUNTER — Encounter: Payer: Self-pay | Admitting: Family Medicine

## 2020-08-24 ENCOUNTER — Telehealth: Payer: Self-pay | Admitting: *Deleted

## 2020-08-24 ENCOUNTER — Ambulatory Visit (INDEPENDENT_AMBULATORY_CARE_PROVIDER_SITE_OTHER): Payer: Medicare Other | Admitting: Family Medicine

## 2020-08-24 VITALS — BP 138/66 | HR 71 | Ht 59.0 in | Wt 125.0 lb

## 2020-08-24 DIAGNOSIS — F0150 Vascular dementia without behavioral disturbance: Secondary | ICD-10-CM | POA: Diagnosis not present

## 2020-08-24 DIAGNOSIS — F39 Unspecified mood [affective] disorder: Secondary | ICD-10-CM | POA: Diagnosis not present

## 2020-08-24 DIAGNOSIS — F01A0 Vascular dementia, mild, without behavioral disturbance, psychotic disturbance, mood disturbance, and anxiety: Secondary | ICD-10-CM

## 2020-08-24 MED ORDER — DONEPEZIL HCL 10 MG PO TABS: 10.0000 mg | ORAL_TABLET | Freq: Every day | ORAL | 1 refills | Status: DC

## 2020-08-24 NOTE — Patient Instructions (Addendum)
  Below is our plan:  We will increase Aricept to 10mg  daily. Please monitor closely for follow up.   Please make sure you are staying well hydrated. I recommend 50-60 ounces daily. Well balanced diet and regular exercise encouraged. Consistent sleep schedule with 6-8 hours recommended.   Please continue follow up with care team as directed.   Follow up with in 6 months   You may receive a survey regarding today's visit. I encourage you to leave honest feed back as I do use this information to improve patient care. Thank you for seeing me today!      Memory Compensation Strategies  1. Use "WARM" strategy.  W= write it down  A= associate it  R= repeat it  M= make a mental note  2.   You can keep a [place].  Use a 3-ring notebook with sections for the following: calendar, important names and phone numbers,  medications, doctors' names/phone numbers, lists/reminders, and a section to journal what you did  each day.   3.    Use a calendar to write appointments down.  4.    Write yourself a schedule for the day.  This can be placed on the calendar or in a separate section of the Memory Notebook.  Keeping a  regular schedule can help memory.  5.    Use medication organizer with sections for each day or morning/evening pills.  You may need help loading it  6.    Keep a basket, or pegboard by the door.  Place items that you need to take out with you in the basket or on the pegboard.  You may also want to  include a message board for reminders.  7.    Use sticky notes.  Place sticky notes with reminders in a place where the task is performed.  For example: " turn off the  stove" placed by the stove, "lock the door" placed on the door at eye level, " take your medications" on  the bathroom mirror or by the place where you normally take your medications.  8.    Use alarms/timers.  Use while cooking to remind yourself to check on food or as a reminder to take your medicine, or  as a  reminder to make a call, or as a reminder to perform another task, etc.

## 2020-08-24 NOTE — Progress Notes (Addendum)
Chief Complaint  Patient presents with  . Follow-up    RM 1, with husband, states she is doing well,      HISTORY OF PRESENT ILLNESS: 08/24/20 ALL:  Jenny [name] is a 75 y.o. female here today for follow up for mild neurocognitive impairment due to multiple etiologies including cerebrovascular disease and medication side effects. She was started on donepezil 5mg  at last visit with Dr in 05/2020. Lab work showing positive RF, elevated IgA and AST. Referrals were placed to rheumatology and hematology. Patient has decided not to pursue additional workup. She does feel that Aricept has helped and has tolerated it well.  She feels "smarter". Her husband agrees that she seems to be a little sharper. She is doing more crossword puzzles. She is reading more. She is wanting to join the gym. She does not drive. She is independent with ADL's. She is sleeping from about 10-8. She has a good appetite. She loves to shop with her son. Anxiety continues to be her biggest concern. She is followed closely by PCP. She continues alprazolam 0.5mg  BID and sertraline 100mg  BID.    HISTORY (copied from Dr 05/2020 previous note)  Jenny [name] is a 75 year old right-handed woman with an underlying complex medical history of hypothyroidism, diabetes, anxiety, depression, history of panic attacks, osteoporosis, hypothyroidism, vitamin D deficiency, stroke, TIA, aortic stenosis, status post bovine aortic valve replacement in 2018, irritable bowel syndrome, hyperlipidemia, chronic systolic CHF, coronary artery disease, and arthritis, who presents for follow-up consultation of her memory loss and numbness in her feet. The patient is accompanied by her husband again today. She rescheduled an appointment from 05/08/20. I last saw her on 01/04/2020, at which time she felt about the same but her husband had noticed more mood irritability and also more forgetfulness.  She was still struggling with her depression.  She reported  numbness and tingling in both feet, no significant pain.  She did have elevated blood sugar values in the evenings.  She reported that her last A1c was less than 7.  We talked about her work-up thus far, EEG was normal in March 2021, neuropsychological evaluation in April 2021 with Dr. 08-29-1978 showed mild neurocognitive disorder that may be multifactorial.  He suspected that she may have minor chronic mild cognitive impairment but no findings particularly concerning for Alzheimer's dementia at the time.    She was advised to discuss mood related issues including depression with her psychiatrist, she had an upcoming appointment, per husband.  She was advised to proceed with an EMG and nerve conduction velocity test through our office.    She had an EMG/nerve conduction velocity test on 02/24/2020 and I reviewed the results: IMPRESSION:   This study demonstrates mild axonal sensorimotor polyneuropathy.  We called with the test results.  Today, 05/23/2020: She reports feeling stable, no new issues with her memory as far she is concerned. Her husband feels that she is fairly stable. Mood wise, she also feels stable. Her husband, she did see a psychiatrist a couple of times but he canceled the follow-up appointment. She has not had any new medications. She continues to take Xanax for anxiety. She reports that she sees her primary care physician for this on a regular basis.  The patient's allergies, current medications, family history, past medical history, past social history, past surgical history and problem list were reviewed and updated as appropriate.    REVIEW OF SYSTEMS: Out of a complete 14 system review of  symptoms, the patient complains only of the following symptoms, memory loss, anxiety and all other reviewed systems are negative.    ALLERGIES: Allergies  Allergen Reactions  . Iodine Swelling    ANGIOEDEMA FACIAL SWELLING "BETADINE OKAY"  . Shellfish Allergy  Anaphylaxis     HOME MEDICATIONS: Outpatient Medications Prior to Visit  Medication Sig Dispense Refill  . alendronate (FOSAMAX) 70 MG tablet Take 70 mg by mouth once a week. Take with a full glass of water on an empty stomach.    . ALPRAZolam (XANAX) 0.5 MG tablet Take 1 tablet (0.5 mg total) by mouth 2 (two) times daily. 60 tablet 0  . Cholecalciferol (VITAMIN D3 PO) Take by mouth daily.    . hydrOXYzine (ATARAX/VISTARIL) 25 MG tablet Take 25 mg by mouth 2 (two) times daily.     . insulin glargine (LANTUS) 100 UNIT/ML injection Inject 25-35 Units into the skin See admin instructions. 35 units every morning and 25 units at bedtime    . levothyroxine (SYNTHROID, LEVOTHROID) 88 MCG tablet Take 88 mcg by mouth daily.    . Loperamide HCl (IMODIUM A-D PO) Take by mouth as needed.    . Melatonin 10 MG TABS Take by mouth at bedtime.    . metFORMIN (GLUCOPHAGE) 500 MG tablet Take 500 mg by mouth 2 (two) times daily.    Marland Kitchen Phenylephrine-DM-GG (MUCINEX FAST-MAX CONGEST COUGH PO) Take by mouth as needed.    . rosuvastatin (CRESTOR) 10 MG tablet TAKE 1 TABLET BY MOUTH DAILY 90 tablet 3  . sertraline (ZOLOFT) 100 MG tablet TAKE 1 TABLET BY MOUTH TWICE DAILY 180 tablet 2  . sitaGLIPtin (JANUVIA) 100 MG tablet Take 100 mg by mouth daily.    Marland Kitchen donepezil (ARICEPT) 10 MG tablet Take 0.5 tablets (5 mg total) by mouth at bedtime. Take 1/2 pill each evening. 15 tablet 3   Facility-Administered Medications Prior to Visit  Medication Dose Route Frequency Provider Last Rate Last Admin  . lidocaine (PF) (XYLOCAINE) 1 % injection 2 mL  2 mL Intradermal Once Corky Downs, MD      . triamcinolone acetonide (KENALOG-40) injection 40 mg  40 mg Intramuscular Once Corky Downs, MD         PAST MEDICAL HISTORY: Past Medical History:  Diagnosis Date  . Anxiety   . Arthritis   . CAD (coronary artery disease)    a. 01/2017 Cath: nonobs dzs.  . Chronic systolic CHF (congestive heart failure) (HCC)    a. TTE 12/2016,  EF 45-50%, probable hypokinesis of the mid apical anteroseptal, anterior, & apical myocardium, GR2DD, possibly bicuspid aortic valve that was moderately thickened w/ severely calcified leaflets, severe aortic stenosis w/ mean gradient 47 mmHg, valve area 0.52 cm, mild MR, mildly dilated LA  . Depression   . Diabetes mellitus with complication (HCC)    Tylenol  . Hyperlipidemia   . Hypothyroidism   . IBS (irritable bowel syndrome)   . Iron deficiency anemia    a. s/p pRBC x1 in 12/2016  . Microcytic anemia   . Osteoporosis   . Panic attacks   . Severe aortic stenosis    a. TTE 12/2016: possibly bicuspid aortic valve that was moderately thickened with severely calcified leaflets. There was severe aortic stenosis with a mean gradient of 47 mmHg and valve area of 0.52 cm; b. 01/2017 s/p AVR w/ 19 mm bioprosthetic Magna Ease pericardial tissue valve, ser# [id number].  . Stroke (HCC) 2001  . TIA (transient ischemic attack) 2006  PAST SURGICAL HISTORY: Past Surgical History:  Procedure Laterality Date  . ABDOMINAL HYSTERECTOMY    . AORTIC VALVE REPLACEMENT N/A 02/17/2017   Procedure: AORTIC VALVE REPLACEMENT (AVR);  Surgeon: Donata Clay, Theron Arista, MD;  Location: [facility] OR;  Service: Open Heart Surgery;  Laterality: N/A;  . CATARACT EXTRACTION W/PHACO Right 04/09/2016   Procedure: CATARACT EXTRACTION PHACO AND INTRAOCULAR LENS PLACEMENT (IOC);  Surgeon: Galen Manila, MD;  Location: [facility] ORS;  Service: Ophthalmology;  Laterality: Right;  [place] 57.2 AP% 18.6 CDE 10.64 Fluid Pack lot # [id number]  . CATARACT EXTRACTION W/PHACO Left 12/29/2018   Procedure: CATARACT EXTRACTION PHACO AND INTRAOCULAR LENS PLACEMENT (IOC)  LEFT DIABETIC;  Surgeon: Galen Manila, MD;  Location[facility];  Service: Ophthalmology;  Laterality: Left;  Diabetic - insuli and oral meds  . CHOLECYSTECTOMY    . COLONOSCOPY    . CORONARY ANGIOGRAPHY N/A 02/03/2017   Procedure: CORONARY ANGIOGRAPHY;  Surgeon: Iran Ouch, MD;  Location: [facility] INVASIVE CV LAB;  Service: Cardiovascular;  Laterality: N/A;  . FRACTURE SURGERY     LEFT ANKLE  . RIGHT AND LEFT HEART CATH Bilateral 02/03/2017   Procedure: Right and Left Heart Cath;  Surgeon: Iran Ouch, MD;  Location: [facility] INVASIVE CV LAB;  Service: Cardiovascular;  Laterality: Bilateral;  . TEE WITHOUT CARDIOVERSION N/A 02/17/2017   Procedure: TRANSESOPHAGEAL ECHOCARDIOGRAM (TEE);  Surgeon: Donata Clay, Theron Arista, MD;  Location: [facility] OR;  Service: Open Heart Surgery;  Laterality: N/A;  . TIBIA IM NAIL INSERTION Left 05/19/2018   Procedure: INTRAMEDULLARY (IM) NAIL TIBIAL;  Surgeon: Juanell Fairly, MD;  Location: [facility] ORS;  Service: Orthopedics;  Laterality: Left;     FAMILY HISTORY: Family History  Problem Relation Age of Onset  . Hypertension Mother      SOCIAL HISTORY: Social History   Socioeconomic History  . Marital status: Married    Spouse name: Not on file  . Number of children: Not on file  . Years of education: Not on file  . Highest education level: Not on file  Occupational History  . Not on file  Tobacco Use  . Smoking status: Former Smoker    Years: 30.00    Quit date: 02/04/2000    Years since quitting: 20.5  . Smokeless tobacco: Never Used  Vaping Use  . Vaping Use: Never used  Substance and Sexual Activity  . Alcohol use: No  . Drug use: No  . Sexual activity: Not Currently  Other Topics Concern  . Not on file  Social History Narrative  . Not on file   Social Determinants of Health   Financial Resource Strain: Not on file  Food Insecurity: Not on file  Transportation Needs: Not on file  Physical Activity: Not on file  Stress: Not on file  Social Connections: Not on file  Intimate Partner Violence: Not on file      PHYSICAL EXAM  Vitals:   08/24/20 1502  BP: 138/66  Pulse: 71  Weight: 125 lb (56.7 kg)  Height: 4\' 11"  (1.499 m)   Body mass index is 25.25 kg/m.   Generalized: Well developed, in no acute  distress  Cardiology: normal rate and rhythm, no murmur auscultated  Respiratory: clear to auscultation bilaterally    Neurological examination  Mentation: Alert oriented to time, place, history taking. Follows all commands speech and language fluent Cranial nerve II-XII: Pupils were equal round reactive to light. Extraocular movements were full, visual field were full on confrontational test. Facial sensation and strength  were normal. Head turning and shoulder shrug  were normal and symmetric. Motor: The motor testing reveals 5 over 5 strength of all 4 extremities. Good symmetric motor tone is noted throughout.  Sensory: Sensory testing is intact to soft touch on all 4 extremities. No evidence of extinction is noted.  Coordination: Cerebellar testing reveals good finger-nose-finger (very mild right ataxia) and heel-to-shin bilaterally.  Gait and station: Gait is stable with Rolator, Tandem not attempted     DIAGNOSTIC DATA (LABS, IMAGING, TESTING) - I reviewed patient records, labs, notes, testing and imaging myself where available.  Lab Results  Component Value Date   WBC 4.1 01/17/2020   HGB 12.9 01/17/2020   HCT 39.1 01/17/2020   MCV 87.9 01/17/2020   PLT 87 (L) 01/17/2020      Component Value Date/Time   NA 138 05/23/2020 1514   NA 134 (L) 10/24/2014 0613   K 4.8 05/23/2020 1514   K 4.4 10/24/2014 0613   CL 102 05/23/2020 1514   CL 105 10/24/2014 0613   CO2 24 05/23/2020 1514   CO2 23 10/24/2014 0613   GLUCOSE 67 05/23/2020 1514   GLUCOSE 164 (H) 06/06/2019 1312   GLUCOSE 293 (H) 10/24/2014 0613   BUN 10 05/23/2020 1514   BUN 11 10/24/2014 0613   CREATININE 0.83 05/23/2020 1514   CREATININE 0.60 10/24/2014 0613   CALCIUM 9.5 05/23/2020 1514   CALCIUM 8.6 (L) 10/24/2014 0613   PROT 7.2 05/23/2020 1514   PROT 7.7 03/02/2012 0634   ALBUMIN 3.9 05/23/2020 1514   ALBUMIN 3.2 (L) 03/02/2012 0634   AST 43 (H) 05/23/2020 1514   AST 26 03/02/2012 0634   ALT 22  05/23/2020 1514   ALT 21 03/02/2012 0634   ALKPHOS 95 05/23/2020 1514   ALKPHOS 103 03/02/2012 0634   BILITOT 0.4 05/23/2020 1514   BILITOT 0.3 03/02/2012 0634   GFRNONAA 71 05/23/2020 1514   GFRNONAA >60 10/24/2014 0613   GFRAA 82 05/23/2020 1514   GFRAA >60 10/24/2014 0613   Lab Results  Component Value Date   CHOL 92 01/15/2017   HDL 36 (L) 01/15/2017   LDLCALC 37 01/15/2017   TRIG 97 01/15/2017   CHOLHDL 2.6 01/15/2017   Lab Results  Component Value Date   HGBA1C 5.6 05/23/2020   Lab Results  Component Value Date   VITAMINB12 933 05/23/2020   Lab Results  Component Value Date   TSH 2.450 05/23/2020    MMSE - Mini Mental State Exam 08/24/2020 05/23/2020 09/02/2019  Orientation to time Orientation to Place Registration Attention/ Calculation Recall Language- name 2 objects Language- repeat Language- follow 3 step command Language- read & follow direction Write a sentence Copy design Total score Montreal Cognitive Assessment  10/06/2019  Visuospatial/ Executive (0/5) 3  Naming (0/3) 3  Attention: Read list of digits (0/2) 2  Attention: Read list of letters (0/1) 1  Attention: Serial 7 subtraction starting at 100 (0/3) 1  Language: Repeat phrase (0/2) 1  Language : Fluency (0/1) 0  Abstraction (0/2) 2  Delayed Recall (0/5) 5  Orientation (0/6) 6  Total 24  Adjusted Score (based on education) 25     ASSESSMENT AND PLAN  74 y.o.  year old female  has a past medical history of Anxiety, Arthritis, CAD (coronary artery disease), Chronic systolic CHF (congestive heart failure) (HCC), Depression, Diabetes mellitus with complication (HCC), Hyperlipidemia, Hypothyroidism, IBS (irritable bowel syndrome), Iron deficiency anemia, Microcytic anemia, Osteoporosis, Panic attacks, Severe aortic stenosis, Stroke (HCC) (2001), and TIA (transient ischemic attack) (2006). here with    Mild vascular neurocognitive disorder (HCC)  Mood disorder (HCC)  Roxxanne feels that Aricept has been very helpful. MMSE does not represent any significant improvement, although, I suspect that a component of anxiety and difficulty with attention are contributing today. She is very talkative and easily distracted. She is tolerating Aricept. Her husband does feel that her memory does seem a little sharper, recently. We will increase dose to 10mg  daily. Side effects reviewed. She will continue healthy lifestyle habits. Memory compensation strategies reviewed. She will follow up closely with PCP. She will return to see me in 6 months, sooner if needed. She and her husband verbalize understanding and agreement with this plan.   No orders of the defined types were placed in this encounter.    Meds ordered this encounter  Medications  . donepezil (ARICEPT) 10 MG tablet    Sig: Take 1 tablet (10 mg total) by mouth at bedtime. Take 1/2 pill each evening.    Dispense:  90 tablet    Refill:  1    Order Specific Question:   Supervising Provider    Answer:   Anson Fret [id number]      I spent 20 minutes of face-to-face and non-face-to-face time with patient.  This included previsit chart review, lab review, study review, order entry, electronic health record documentation, patient education.    Shawnie Dapper, MSN, FNP-C 08/24/2020, 3:33 PM  [facility] [address] [place], [place] [address] [phone]  I reviewed the above note and documentation by the Nurse Practitioner and agree with the history, exam, assessment and plan as outlined above. I was available for consultation. Huston Foley, MD, PhD [facility])

## 2020-08-24 NOTE — Telephone Encounter (Signed)
Received call from pharmacist and needed clarification on donepezil as 2 instructions.  I relayed that we saw in office today and she increased the donepezil to 10mg  po daily at bedtime.   She took verbal order.

## 2020-09-05 ENCOUNTER — Other Ambulatory Visit: Payer: Self-pay | Admitting: *Deleted

## 2020-09-05 ENCOUNTER — Ambulatory Visit (INDEPENDENT_AMBULATORY_CARE_PROVIDER_SITE_OTHER): Payer: Medicare Other | Admitting: Internal Medicine

## 2020-09-05 ENCOUNTER — Encounter: Payer: Self-pay | Admitting: Internal Medicine

## 2020-09-05 ENCOUNTER — Other Ambulatory Visit: Payer: Self-pay

## 2020-09-05 VITALS — BP 138/78 | HR 72 | Ht 59.0 in | Wt 123.4 lb

## 2020-09-05 DIAGNOSIS — E038 Other specified hypothyroidism: Secondary | ICD-10-CM | POA: Diagnosis not present

## 2020-09-05 DIAGNOSIS — R413 Other amnesia: Secondary | ICD-10-CM | POA: Diagnosis not present

## 2020-09-05 DIAGNOSIS — F419 Anxiety disorder, unspecified: Secondary | ICD-10-CM | POA: Diagnosis not present

## 2020-09-05 DIAGNOSIS — E063 Autoimmune thyroiditis: Secondary | ICD-10-CM

## 2020-09-05 DIAGNOSIS — I350 Nonrheumatic aortic (valve) stenosis: Secondary | ICD-10-CM

## 2020-09-05 DIAGNOSIS — K589 Irritable bowel syndrome without diarrhea: Secondary | ICD-10-CM | POA: Diagnosis not present

## 2020-09-05 DIAGNOSIS — I10 Essential (primary) hypertension: Secondary | ICD-10-CM | POA: Diagnosis not present

## 2020-09-05 MED ORDER — ALPRAZOLAM 0.5 MG PO TABS: 0.5000 mg | ORAL_TABLET | Freq: Two times a day (BID) | ORAL | 0 refills | Status: DC

## 2020-09-05 NOTE — Progress Notes (Signed)
Established Patient Office Visit  Subjective:  Patient ID: Jenny [name], female    DOB: [birth year]  Age: 75 y.o. MRN: [id number]  CC:  Chief Complaint  Patient presents with  . Medication Refill    Patient needs refill of xanax     HPI  [name] patient came for general checkup.  She is known to have diabetes hypothyroidism and neuropathy.  Patient blood sugar is under control she has memory loss due to Alzheimer's kind of dementia.  And has been on Aricept.  She is status post aortic valve replacement she does not seem to be depressed but she is anxious Past Medical History:  Diagnosis Date  . Anxiety   . Arthritis   . CAD (coronary artery disease)    a. 01/2017 Cath: nonobs dzs.  . Chronic systolic CHF (congestive heart failure) (HCC)    a. TTE 12/2016, EF 45-50%, probable hypokinesis of the mid apical anteroseptal, anterior, & apical myocardium, GR2DD, possibly bicuspid aortic valve that was moderately thickened w/ severely calcified leaflets, severe aortic stenosis w/ mean gradient 47 mmHg, valve area 0.52 cm, mild MR, mildly dilated LA  . Depression   . Diabetes mellitus with complication (HCC)    Tylenol  . Hyperlipidemia   . Hypothyroidism   . IBS (irritable bowel syndrome)   . Iron deficiency anemia    a. s/p pRBC x1 in 12/2016  . Microcytic anemia   . Osteoporosis   . Panic attacks   . Severe aortic stenosis    a. TTE 12/2016: possibly bicuspid aortic valve that was moderately thickened with severely calcified leaflets. There was severe aortic stenosis with a mean gradient of 47 mmHg and valve area of 0.52 cm; b. 01/2017 s/p AVR w/ 19 mm bioprosthetic Magna Ease pericardial tissue valve, ser# [id number].  . Stroke (HCC) 2001  . TIA (transient ischemic attack) 2006    Past Surgical History:  Procedure Laterality Date  . ABDOMINAL HYSTERECTOMY    . AORTIC VALVE REPLACEMENT N/A 02/17/2017   Procedure: AORTIC VALVE REPLACEMENT (AVR);  Surgeon: Donata Clay,  Theron Arista, MD;  Location: [facility] OR;  Service: Open Heart Surgery;  Laterality: N/A;  . CATARACT EXTRACTION W/PHACO Right 04/09/2016   Procedure: CATARACT EXTRACTION PHACO AND INTRAOCULAR LENS PLACEMENT (IOC);  Surgeon: Galen Manila, MD;  Location: [facility] ORS;  Service: Ophthalmology;  Laterality: Right;  [place] 57.2 AP% 18.6 CDE 10.64 Fluid Pack lot # [id number]  . CATARACT EXTRACTION W/PHACO Left 12/29/2018   Procedure: CATARACT EXTRACTION PHACO AND INTRAOCULAR LENS PLACEMENT (IOC)  LEFT DIABETIC;  Surgeon: Galen Manila, MD;  Location: [facility];  Service: Ophthalmology;  Laterality: Left;  Diabetic - insuli and oral meds  . CHOLECYSTECTOMY    . COLONOSCOPY    . CORONARY ANGIOGRAPHY N/A 02/03/2017   Procedure: CORONARY ANGIOGRAPHY;  Surgeon: Iran Ouch, MD;  Location: [facility] INVASIVE CV LAB;  Service: Cardiovascular;  Laterality: N/A;  . FRACTURE SURGERY     LEFT ANKLE  . RIGHT AND LEFT HEART CATH Bilateral 02/03/2017   Procedure: Right and Left Heart Cath;  Surgeon: Iran Ouch, MD;  Location: [facility] INVASIVE CV LAB;  Service: Cardiovascular;  Laterality: Bilateral;  . TEE WITHOUT CARDIOVERSION N/A 02/17/2017   Procedure: TRANSESOPHAGEAL ECHOCARDIOGRAM (TEE);  Surgeon: Donata Clay, Theron Arista, MD;  Location: [facility] OR;  Service: Open Heart Surgery;  Laterality: N/A;  . TIBIA IM NAIL INSERTION Left 05/19/2018   Procedure: INTRAMEDULLARY (IM) NAIL TIBIAL;  Surgeon: Juanell Fairly, MD;  Location: [facility]  ORS;  Service: Orthopedics;  Laterality: Left;    Family History  Problem Relation Age of Onset  . Hypertension Mother     Social History   Socioeconomic History  . Marital status: Married    Spouse name: Not on file  . Number of children: Not on file  . Years of education: Not on file  . Highest education level: Not on file  Occupational History  . Not on file  Tobacco Use  . Smoking status: Former Smoker    Years: 30.00    Quit date: 02/04/2000    Years since quitting: 20.6  .  Smokeless tobacco: Never Used  Vaping Use  . Vaping Use: Never used  Substance and Sexual Activity  . Alcohol use: No  . Drug use: No  . Sexual activity: Not Currently  Other Topics Concern  . Not on file  Social History Narrative  . Not on file   Social Determinants of Health   Financial Resource Strain: Not on file  Food Insecurity: Not on file  Transportation Needs: Not on file  Physical Activity: Not on file  Stress: Not on file  Social Connections: Not on file  Intimate Partner Violence: Not on file     Current Outpatient Medications:  .  alendronate (FOSAMAX) 70 MG tablet, Take 70 mg by mouth once a week. Take with a full glass of water on an empty stomach., Disp: , Rfl:  .  ALPRAZolam (XANAX) 0.5 MG tablet, Take 1 tablet (0.5 mg total) by mouth 2 (two) times daily., Disp: 60 tablet, Rfl: 0 .  Cholecalciferol (VITAMIN D3 PO), Take by mouth daily., Disp: , Rfl:  .  donepezil (ARICEPT) 10 MG tablet, Take 1 tablet (10 mg total) by mouth at bedtime. Take 1/2 pill each evening., Disp: 90 tablet, Rfl: 1 .  hydrOXYzine (ATARAX/VISTARIL) 25 MG tablet, Take 25 mg by mouth 2 (two) times daily. , Disp: , Rfl:  .  insulin glargine (LANTUS) 100 UNIT/ML injection, Inject 25-35 Units into the skin See admin instructions. 35 units every morning and 25 units at bedtime, Disp: , Rfl:  .  levothyroxine (SYNTHROID, LEVOTHROID) 88 MCG tablet, Take 88 mcg by mouth daily., Disp: , Rfl:  .  Loperamide HCl (IMODIUM A-D PO), Take by mouth as needed., Disp: , Rfl:  .  Melatonin 10 MG TABS, Take by mouth at bedtime., Disp: , Rfl:  .  metFORMIN (GLUCOPHAGE) 500 MG tablet, Take 500 mg by mouth 2 (two) times daily., Disp: , Rfl:  .  Phenylephrine-DM-GG (MUCINEX FAST-MAX CONGEST COUGH PO), Take by mouth as needed., Disp: , Rfl:  .  rosuvastatin (CRESTOR) 10 MG tablet, TAKE 1 TABLET BY MOUTH DAILY, Disp: 90 tablet, Rfl: 3 .  sertraline (ZOLOFT) 100 MG tablet, TAKE 1 TABLET BY MOUTH TWICE DAILY, Disp: 180  tablet, Rfl: 2 .  sitaGLIPtin (JANUVIA) 100 MG tablet, Take 100 mg by mouth daily., Disp: , Rfl:   Current Facility-Administered Medications:  .  lidocaine (PF) (XYLOCAINE) 1 % injection 2 mL, 2 mL, Intradermal, Once, Masoud, Javed, MD .  triamcinolone acetonide (KENALOG-40) injection 40 mg, 40 mg, Intramuscular, Once, Corky Downs, MD   Allergies  Allergen Reactions  . Iodine Swelling    ANGIOEDEMA FACIAL SWELLING "BETADINE OKAY"  . Shellfish Allergy Anaphylaxis    ROS Review of Systems  Constitutional: Negative.   HENT: Negative.   Eyes: Negative.   Respiratory: Negative.   Cardiovascular: Negative.   Gastrointestinal: Negative.   Endocrine: Negative.  Genitourinary: Negative.   Musculoskeletal: Negative.   Skin: Negative.   Allergic/Immunologic: Negative.   Neurological: Negative.   Hematological: Negative.   Psychiatric/Behavioral: Negative.  Negative for behavioral problems and dysphoric mood.  All other systems reviewed and are negative.     Objective:    Physical Exam Cardiovascular:     Heart sounds: Murmur heard.   Crescendo decrescendo systolic murmur is present with a grade of 2/6.   Psychiatric:        Attention and Perception: Attention and perception normal. She does not perceive auditory or visual hallucinations.        Mood and Affect: Mood and affect normal.        Speech: Speech normal.        Behavior: Behavior normal.        Cognition and Memory: Memory is impaired.     BP 138/78   Pulse 72   Ht 4\' 11"  (1.499 m)   Wt 123 lb 6.4 oz (56 kg)   BMI 24.92 kg/m  Wt Readings from Last 3 Encounters:  09/05/20 123 lb 6.4 oz (56 kg)  08/24/20 125 lb (56.7 kg)  08/08/20 123 lb 8 oz (56 kg)     Health Maintenance Due  Topic Date Due  . Hepatitis C Screening  Never done  . COVID-19 Vaccine (1) Never done  . FOOT EXAM  Never done  . OPHTHALMOLOGY EXAM  Never done  . URINE MICROALBUMIN  Never done  . TETANUS/TDAP  Never done  .  COLONOSCOPY (Pts 45-23yrs Insurance coverage will need to be confirmed)  Never done  . MAMMOGRAM  Never done  . DEXA SCAN  Never done  . PNA vac Low Risk Adult (2 of 2 - PCV13) 05/23/2019    There are no preventive care reminders to display for this patient.  Lab Results  Component Value Date   TSH 2.450 05/23/2020   Lab Results  Component Value Date   WBC 4.1 01/17/2020   HGB 12.9 01/17/2020   HCT 39.1 01/17/2020   MCV 87.9 01/17/2020   PLT 87 (L) 01/17/2020   Lab Results  Component Value Date   NA 138 05/23/2020   K 4.8 05/23/2020   CO2 24 05/23/2020   GLUCOSE 67 05/23/2020   BUN 10 05/23/2020   CREATININE 0.83 05/23/2020   BILITOT 0.4 05/23/2020   ALKPHOS 95 05/23/2020   AST 43 (H) 05/23/2020   ALT 22 05/23/2020   PROT 7.2 05/23/2020   ALBUMIN 3.9 05/23/2020   CALCIUM 9.5 05/23/2020   ANIONGAP 8 06/06/2019   Lab Results  Component Value Date   CHOL 92 01/15/2017   Lab Results  Component Value Date   HDL 36 (L) 01/15/2017   Lab Results  Component Value Date   LDLCALC 37 01/15/2017   Lab Results  Component Value Date   TRIG 97 01/15/2017   Lab Results  Component Value Date   CHOLHDL 2.6 01/15/2017   Lab Results  Component Value Date   HGBA1C 5.6 05/23/2020      Assessment & Plan:   Problem List Items Addressed This Visit      Cardiovascular and Mediastinum   Severe aortic stenosis - Primary    Patient had aortic valve replacement now has a residual murmur of the aortic valve.      Essential hypertension    Patient blood pressure is normal patient denies any chest pain or shortness of breath there is no history of palpitation paroxysmal nocturnal dyspnea  patient can walk 2 blocks without any problem patient was advised to follow low-salt low-cholesterol diet        Digestive   Irritable bowel syndrome    Patient was advised to avoid diet high in sugars and sweets advised onions and garlic        Endocrine   Hypothyroidism    Patient  was advised to continue using her Levoxyl        Other   Anxiety    - Patient experiencing high levels of anxiety.  - Encouraged patient to engage in relaxing activities like yoga, meditation, journaling, going for a walk, or participating in a hobby.  - Encouraged patient to reach out to trusted friends or family members about recent struggles      Memory loss    Patient was referred to neurologist         No orders of the defined types were placed in this encounter.   Follow-up: No follow-ups on file.    Corky Downs, MD

## 2020-09-05 NOTE — Assessment & Plan Note (Signed)
Patient was advised to avoid diet high in sugars and sweets advised onions and garlic

## 2020-09-05 NOTE — Assessment & Plan Note (Signed)
Patient had aortic valve replacement now has a residual murmur of the aortic valve.

## 2020-09-05 NOTE — Assessment & Plan Note (Signed)
Patient was advised to continue using her Levoxyl

## 2020-09-05 NOTE — Assessment & Plan Note (Signed)
Patient was referred to neurologist. 

## 2020-09-05 NOTE — Assessment & Plan Note (Signed)
-   Patient experiencing high levels of anxiety.  - Encouraged patient to engage in relaxing activities like yoga, meditation, journaling, going for a walk, or participating in a hobby.  - Encouraged patient to reach out to trusted friends or family members about recent struggles 

## 2020-09-05 NOTE — Assessment & Plan Note (Signed)
Patient blood pressure is normal patient denies any chest pain or shortness of breath there is no history of palpitation paroxysmal nocturnal dyspnea patient can walk 2 blocks without any problem patient was advised to follow low-salt low-cholesterol diet 

## 2020-09-11 ENCOUNTER — Other Ambulatory Visit: Payer: Self-pay | Admitting: Internal Medicine

## 2020-09-14 ENCOUNTER — Other Ambulatory Visit: Payer: Self-pay | Admitting: Internal Medicine

## 2020-09-14 DIAGNOSIS — E113213 Type 2 diabetes mellitus with mild nonproliferative diabetic retinopathy with macular edema, bilateral: Secondary | ICD-10-CM | POA: Diagnosis not present

## 2020-09-28 ENCOUNTER — Other Ambulatory Visit: Payer: Self-pay | Admitting: Internal Medicine

## 2020-10-03 ENCOUNTER — Ambulatory Visit (INDEPENDENT_AMBULATORY_CARE_PROVIDER_SITE_OTHER): Payer: Medicare Other | Admitting: Internal Medicine

## 2020-10-03 DIAGNOSIS — I350 Nonrheumatic aortic (valve) stenosis: Secondary | ICD-10-CM | POA: Diagnosis not present

## 2020-10-03 DIAGNOSIS — Z794 Long term (current) use of insulin: Secondary | ICD-10-CM | POA: Diagnosis not present

## 2020-10-03 DIAGNOSIS — R413 Other amnesia: Secondary | ICD-10-CM

## 2020-10-03 DIAGNOSIS — I10 Essential (primary) hypertension: Secondary | ICD-10-CM | POA: Diagnosis not present

## 2020-10-03 DIAGNOSIS — F419 Anxiety disorder, unspecified: Secondary | ICD-10-CM

## 2020-10-03 DIAGNOSIS — E119 Type 2 diabetes mellitus without complications: Secondary | ICD-10-CM | POA: Diagnosis not present

## 2020-10-04 ENCOUNTER — Other Ambulatory Visit: Payer: Self-pay | Admitting: Internal Medicine

## 2020-10-06 DIAGNOSIS — Z20822 Contact with and (suspected) exposure to covid-19: Secondary | ICD-10-CM | POA: Diagnosis not present

## 2020-10-16 DIAGNOSIS — E113213 Type 2 diabetes mellitus with mild nonproliferative diabetic retinopathy with macular edema, bilateral: Secondary | ICD-10-CM | POA: Diagnosis not present

## 2020-10-19 ENCOUNTER — Ambulatory Visit (INDEPENDENT_AMBULATORY_CARE_PROVIDER_SITE_OTHER): Payer: Medicare Other | Admitting: Internal Medicine

## 2020-10-19 ENCOUNTER — Other Ambulatory Visit: Payer: Self-pay

## 2020-10-19 ENCOUNTER — Encounter: Payer: Self-pay | Admitting: Internal Medicine

## 2020-10-19 VITALS — BP 129/69 | HR 88 | Ht 59.0 in | Wt 115.4 lb

## 2020-10-19 DIAGNOSIS — E119 Type 2 diabetes mellitus without complications: Secondary | ICD-10-CM | POA: Diagnosis not present

## 2020-10-19 DIAGNOSIS — Z794 Long term (current) use of insulin: Secondary | ICD-10-CM | POA: Diagnosis not present

## 2020-10-19 LAB — POCT GLYCOSYLATED HEMOGLOBIN (HGB A1C): Hemoglobin A1C: 6.2 % — AB (ref 4.0–5.6)

## 2020-10-19 MED ORDER — ALPRAZOLAM 0.5 MG PO TABS: 0.5000 mg | ORAL_TABLET | Freq: Two times a day (BID) | ORAL | 0 refills | Status: DC

## 2020-10-19 NOTE — Progress Notes (Signed)
Established Patient Office Visit  Subjective:  Patient ID: Jenny [name], female    DOB: [birth year]  Age: 75 y.o. MRN: [id number]  CC:  Chief Complaint  Patient presents with  . Follow-up    HPI  PACITA VENA presents forcheck up  Past Medical History:  Diagnosis Date  . Anxiety   . Arthritis   . CAD (coronary artery disease)    a. 01/2017 Cath: nonobs dzs.  . Chronic systolic CHF (congestive heart failure) (HCC)    a. TTE 12/2016, EF 45-50%, probable hypokinesis of the mid apical anteroseptal, anterior, & apical myocardium, GR2DD, possibly bicuspid aortic valve that was moderately thickened w/ severely calcified leaflets, severe aortic stenosis w/ mean gradient 47 mmHg, valve area 0.52 cm, mild MR, mildly dilated LA  . Depression   . Diabetes mellitus with complication (HCC)    Tylenol  . Hyperlipidemia   . Hypothyroidism   . IBS (irritable bowel syndrome)   . Iron deficiency anemia    a. s/p pRBC x1 in 12/2016  . Microcytic anemia   . Osteoporosis   . Panic attacks   . Severe aortic stenosis    a. TTE 12/2016: possibly bicuspid aortic valve that was moderately thickened with severely calcified leaflets. There was severe aortic stenosis with a mean gradient of 47 mmHg and valve area of 0.52 cm; b. 01/2017 s/p AVR w/ 19 mm bioprosthetic Magna Ease pericardial tissue valve, ser# [id number].  . Stroke (HCC) 2001  . TIA (transient ischemic attack) 2006    Past Surgical History:  Procedure Laterality Date  . ABDOMINAL HYSTERECTOMY    . AORTIC VALVE REPLACEMENT N/A 02/17/2017   Procedure: AORTIC VALVE REPLACEMENT (AVR);  Surgeon: Donata Clay, Theron Arista, MD;  Location: [facility] OR;  Service: Open Heart Surgery;  Laterality: N/A;  . CATARACT EXTRACTION W/PHACO Right 04/09/2016   Procedure: CATARACT EXTRACTION PHACO AND INTRAOCULAR LENS PLACEMENT (IOC);  Surgeon: Galen Manila, MD;  Location: [facility] ORS;  Service: Ophthalmology;  Laterality: Right;  [place] 57.2 AP% 18.6 CDE 10.64 Fluid  Pack lot # [id number]  . CATARACT EXTRACTION W/PHACO Left 12/29/2018   Procedure: CATARACT EXTRACTION PHACO AND INTRAOCULAR LENS PLACEMENT (IOC)  LEFT DIABETIC;  Surgeon: Galen Manila, MD;  Location: [facility];  Service: Ophthalmology;  Laterality: Left;  Diabetic - insuli and oral meds  . CHOLECYSTECTOMY    . COLONOSCOPY    . CORONARY ANGIOGRAPHY N/A 02/03/2017   Procedure: CORONARY ANGIOGRAPHY;  Surgeon: Iran Ouch, MD;  Location: [facility] INVASIVE CV LAB;  Service: Cardiovascular;  Laterality: N/A;  . FRACTURE SURGERY     LEFT ANKLE  . RIGHT AND LEFT HEART CATH Bilateral 02/03/2017   Procedure: Right and Left Heart Cath;  Surgeon: Iran Ouch, MD;  Location: [facility] INVASIVE CV LAB;  Service: Cardiovascular;  Laterality: Bilateral;  . TEE WITHOUT CARDIOVERSION N/A 02/17/2017   Procedure: TRANSESOPHAGEAL ECHOCARDIOGRAM (TEE);  Surgeon: Donata Clay, Theron Arista, MD;  Location: [facility] OR;  Service: Open Heart Surgery;  Laterality: N/A;  . TIBIA IM NAIL INSERTION Left 05/19/2018   Procedure: INTRAMEDULLARY (IM) NAIL TIBIAL;  Surgeon: Juanell Fairly, MD;  Location: [facility] ORS;  Service: Orthopedics;  Laterality: Left;    Family History  Problem Relation Age of Onset  . Hypertension Mother     Social History   Socioeconomic History  . Marital status: Married    Spouse name: Not on file  . Number of children: Not on file  . Years of education: Not on file  .  Highest education level: Not on file  Occupational History  . Not on file  Tobacco Use  . Smoking status: Former Smoker    Years: 30.00    Quit date: 02/04/2000    Years since quitting: 20.7  . Smokeless tobacco: Never Used  Vaping Use  . Vaping Use: Never used  Substance and Sexual Activity  . Alcohol use: No  . Drug use: No  . Sexual activity: Not Currently  Other Topics Concern  . Not on file  Social History Narrative  . Not on file   Social Determinants of Health   Financial Resource Strain: Not on file  Food  Insecurity: Not on file  Transportation Needs: Not on file  Physical Activity: Not on file  Stress: Not on file  Social Connections: Not on file  Intimate Partner Violence: Not on file     Current Outpatient Medications:  .  alendronate (FOSAMAX) 70 MG tablet, Take 70 mg by mouth once a week. Take with a full glass of water on an empty stomach., Disp: , Rfl:  .  ALPRAZolam (XANAX) 0.5 MG tablet, Take 1 tablet (0.5 mg total) by mouth 2 (two) times daily., Disp: 60 tablet, Rfl: 0 .  Cholecalciferol (VITAMIN D3 PO), Take by mouth daily., Disp: , Rfl:  .  donepezil (ARICEPT) 10 MG tablet, Take 1 tablet (10 mg total) by mouth at bedtime. Take 1/2 pill each evening., Disp: 90 tablet, Rfl: 1 .  hydrOXYzine (ATARAX/VISTARIL) 25 MG tablet, Take 25 mg by mouth 2 (two) times daily. , Disp: , Rfl:  .  insulin glargine (LANTUS) 100 UNIT/ML injection, Inject 25-35 Units into the skin See admin instructions. 35 units every morning and 25 units at bedtime, Disp: , Rfl:  .  Insulin Syringe-Needle U-100 (INSULIN SYRINGE .5CC/31GX5/16") 31G X 5/16" 0.5 ML MISC, USE 1 NEEDLE TWICE DAILY, Disp: 100 each, Rfl: 6 .  JANUVIA 100 MG tablet, TAKE 1 TABLET BY MOUTH EVERY MORNING, Disp: 90 tablet, Rfl: 3 .  levothyroxine (SYNTHROID) 88 MCG tablet, TAKE 1 TABLET BY MOUTH DAILY, Disp: 90 tablet, Rfl: 3 .  Loperamide HCl (IMODIUM A-D PO), Take by mouth as needed., Disp: , Rfl:  .  Melatonin 10 MG TABS, Take by mouth at bedtime., Disp: , Rfl:  .  metFORMIN (GLUCOPHAGE) 500 MG tablet, TAKE 1 TABLET BY MOUTH TWICE DAILY, Disp: 180 tablet, Rfl: 3 .  Phenylephrine-DM-GG (MUCINEX FAST-MAX CONGEST COUGH PO), Take by mouth as needed., Disp: , Rfl:  .  rosuvastatin (CRESTOR) 10 MG tablet, TAKE 1 TABLET BY MOUTH DAILY, Disp: 90 tablet, Rfl: 3 .  sertraline (ZOLOFT) 100 MG tablet, TAKE 1 TABLET BY MOUTH TWICE DAILY, Disp: 180 tablet, Rfl: 2  Current Facility-Administered Medications:  .  lidocaine (PF) (XYLOCAINE) 1 % injection 2  mL, 2 mL, Intradermal, Once, Lessie Manigo, MD .  triamcinolone acetonide (KENALOG-40) injection 40 mg, 40 mg, Intramuscular, Once, Corky Downs, MD   Allergies  Allergen Reactions  . Iodine Swelling    ANGIOEDEMA FACIAL SWELLING "BETADINE OKAY"  . Shellfish Allergy Anaphylaxis    ROS Review of Systems    Objective:    Physical Exam  BP 129/69   Pulse 88   Ht 4\' 11"  (1.499 m)   Wt 115 lb 6.4 oz (52.3 kg)   BMI 23.31 kg/m  Wt Readings from Last 3 Encounters:  10/19/20 115 lb 6.4 oz (52.3 kg)  09/05/20 123 lb 6.4 oz (56 kg)  08/24/20 125 lb (56.7 kg)  Health Maintenance Due  Topic Date Due  . Hepatitis C Screening  Never done  . COVID-19 Vaccine (1) Never done  . FOOT EXAM  Never done  . OPHTHALMOLOGY EXAM  Never done  . URINE MICROALBUMIN  Never done  . TETANUS/TDAP  Never done  . COLONOSCOPY (Pts 45-49yrs Insurance coverage will need to be confirmed)  Never done  . MAMMOGRAM  Never done  . DEXA SCAN  Never done  . PNA vac Low Risk Adult (2 of 2 - PCV13) 05/23/2019    There are no preventive care reminders to display for this patient.  Lab Results  Component Value Date   TSH 2.450 05/23/2020   Lab Results  Component Value Date   WBC 4.1 01/17/2020   HGB 12.9 01/17/2020   HCT 39.1 01/17/2020   MCV 87.9 01/17/2020   PLT 87 (L) 01/17/2020   Lab Results  Component Value Date   NA 138 05/23/2020   K 4.8 05/23/2020   CO2 24 05/23/2020   GLUCOSE 67 05/23/2020   BUN 10 05/23/2020   CREATININE 0.83 05/23/2020   BILITOT 0.4 05/23/2020   ALKPHOS 95 05/23/2020   AST 43 (H) 05/23/2020   ALT 22 05/23/2020   PROT 7.2 05/23/2020   ALBUMIN 3.9 05/23/2020   CALCIUM 9.5 05/23/2020   ANIONGAP 8 06/06/2019   Lab Results  Component Value Date   CHOL 92 01/15/2017   Lab Results  Component Value Date   HDL 36 (L) 01/15/2017   Lab Results  Component Value Date   LDLCALC 37 01/15/2017   Lab Results  Component Value Date   TRIG 97 01/15/2017   Lab  Results  Component Value Date   CHOLHDL 2.6 01/15/2017   Lab Results  Component Value Date   HGBA1C 5.6 05/23/2020      Assessment & Plan:   Problem List Items Addressed This Visit      Endocrine   Diabetes (HCC) - Primary (Chronic)   Relevant Orders   POCT HgB A1C      No orders of the defined types were placed in this encounter.   Follow-up: No follow-ups on file.    Corky Downs, MD

## 2020-10-19 NOTE — Assessment & Plan Note (Signed)
Patient is status post aortic valve replacement.  She has a residual murmur in the left side

## 2020-10-19 NOTE — Assessment & Plan Note (Signed)
Stable at the present time. 

## 2020-10-19 NOTE — Assessment & Plan Note (Signed)

## 2020-10-19 NOTE — Assessment & Plan Note (Signed)
-   Patient experiencing high levels of anxiety.  - Encouraged patient to engage in relaxing activities like yoga, meditation, journaling, going for a walk, or participating in a hobby.  - Encouraged patient to reach out to trusted friends or family members about recent struggles 

## 2020-10-19 NOTE — Progress Notes (Signed)
Established Patient Office Visit  Subjective:  Patient ID: Jenny [name], female    DOB: 1946-06-04  Age: 75 y.o. MRN: [id number]  CC: No chief complaint on file.   HPI  Jenny [name] presents for check up, she complains of itching, denies chest pain or shortness of breath.  She does not smoke does not drink denies any history of swelling of the legs there is no history of paroxysmal nocturnal dyspnea  Past Medical History:  Diagnosis Date  . Anxiety   . Arthritis   . CAD (coronary artery disease)    a. 01/2017 Cath: nonobs dzs.  . Chronic systolic CHF (congestive heart failure) (HCC)    a. TTE 12/2016, EF 45-50%, probable hypokinesis of the mid apical anteroseptal, anterior, & apical myocardium, GR2DD, possibly bicuspid aortic valve that was moderately thickened w/ severely calcified leaflets, severe aortic stenosis w/ mean gradient 47 mmHg, valve area 0.52 cm, mild MR, mildly dilated LA  . Depression   . Diabetes mellitus with complication (HCC)    Tylenol  . Hyperlipidemia   . Hypothyroidism   . IBS (irritable bowel syndrome)   . Iron deficiency anemia    a. s/p pRBC x1 in 12/2016  . Microcytic anemia   . Osteoporosis   . Panic attacks   . Severe aortic stenosis    a. TTE 12/2016: possibly bicuspid aortic valve that was moderately thickened with severely calcified leaflets. There was severe aortic stenosis with a mean gradient of 47 mmHg and valve area of 0.52 cm; b. 01/2017 s/p AVR w/ 19 mm bioprosthetic Magna Ease pericardial tissue valve, ser# [id number].  . Stroke (HCC) 2001  . TIA (transient ischemic attack) 2006    Past Surgical History:  Procedure Laterality Date  . ABDOMINAL HYSTERECTOMY    . AORTIC VALVE REPLACEMENT N/A 02/17/2017   Procedure: AORTIC VALVE REPLACEMENT (AVR);  Surgeon: Donata Clay, Theron Arista, MD;  Location: [facility] OR;  Service: Open Heart Surgery;  Laterality: N/A;  . CATARACT EXTRACTION W/PHACO Right 04/09/2016   Procedure: CATARACT EXTRACTION PHACO AND  INTRAOCULAR LENS PLACEMENT (IOC);  Surgeon: Galen Manila, MD;  Location: [facility] ORS;  Service: Ophthalmology;  Laterality: Right;  [place] 57.2 AP% 18.6 CDE 10.64 Fluid Pack lot # [id number]  . CATARACT EXTRACTION W/PHACO Left 12/29/2018   Procedure: CATARACT EXTRACTION PHACO AND INTRAOCULAR LENS PLACEMENT (IOC)  LEFT DIABETIC;  Surgeon: Galen Manila, MD;  Location: [facility];  Service: Ophthalmology;  Laterality: Left;  Diabetic - insuli and oral meds  . CHOLECYSTECTOMY    . COLONOSCOPY    . CORONARY ANGIOGRAPHY N/A 02/03/2017   Procedure: CORONARY ANGIOGRAPHY;  Surgeon: Iran Ouch, MD;  Location: [facility] INV[facility] LAB;  Service: Cardiovascular;  Laterality: N/A;  . FRACTURE SURGERY     LEFT ANKLE  . RIGHT AND LEFT HEART CATH Bilateral 02/03/2017   Procedure: Right and Left Heart Cath;  Surgeon: Iran Ouch, MD;  Location: [facility] INV[facility] CV LAB;  Service: Cardiovascular;  Laterality: Bilateral;  . TEE WITHOUT CARDIOVERSION N/A 02/17/2017   Procedure: TRANSESOPHAGEAL ECHOCARDIOGRAM (TEE);  Surgeon: Donata Clay, Theron Arista, MD;  Location: [facility] OR;  Service: Open Heart Surgery;  Laterality: N/A;  . TIBIA IM NAIL INSERTION Left 05/19/2018   Procedure: INTRAMEDULLARY (IM) NAIL TIBIAL;  Surgeon: Juanell Fairly, MD;  Location: [facility] ORS;  Service: Orthopedics;  Laterality: Left;    Family History  Problem Relation Age of Onset  . Hypertension Mother     Social History   Socioeconomic History  .  Marital status: Married    Spouse name: Not on file  . Number of children: Not on file  . Years of education: Not on file  . Highest education level: Not on file  Occupational History  . Not on file  Tobacco Use  . Smoking status: Former Smoker    Years: 30.00    Quit date: 02/04/2000    Years since quitting: 20.7  . Smokeless tobacco: Never Used  Vaping Use  . Vaping Use: Never used  Substance and Sexual Activity  . Alcohol use: No  . Drug use: No  . Sexual activity: Not Currently   Other Topics Concern  . Not on file  Social History Narrative  . Not on file   Social Determinants of Health   Financial Resource Strain: Not on file  Food Insecurity: Not on file  Transportation Needs: Not on file  Physical Activity: Not on file  Stress: Not on file  Social Connections: Not on file  Intimate Partner Violence: Not on file     Current Outpatient Medications:  .  alendronate (FOSAMAX) 70 MG tablet, Take 70 mg by mouth once a week. Take with a full glass of water on an empty stomach., Disp: , Rfl:  .  ALPRAZolam (XANAX) 0.5 MG tablet, Take 1 tablet (0.5 mg total) by mouth 2 (two) times daily., Disp: 60 tablet, Rfl: 0 .  Cholecalciferol (VITAMIN D3 PO), Take by mouth daily., Disp: , Rfl:  .  donepezil (ARICEPT) 10 MG tablet, Take 1 tablet (10 mg total) by mouth at bedtime. Take 1/2 pill each evening., Disp: 90 tablet, Rfl: 1 .  hydrOXYzine (ATARAX/VISTARIL) 25 MG tablet, Take 25 mg by mouth 2 (two) times daily. , Disp: , Rfl:  .  insulin glargine (LANTUS) 100 UNIT/ML injection, Inject 25-35 Units into the skin See admin instructions. 35 units every morning and 25 units at bedtime, Disp: , Rfl:  .  Insulin Syringe-Needle U-100 (INSULIN SYRINGE .5CC/31GX5/16") 31G X 5/16" 0.5 ML MISC, USE 1 NEEDLE TWICE DAILY, Disp: 100 each, Rfl: 6 .  JANUVIA 100 MG tablet, TAKE 1 TABLET BY MOUTH EVERY MORNING, Disp: 90 tablet, Rfl: 3 .  levothyroxine (SYNTHROID) 88 MCG tablet, TAKE 1 TABLET BY MOUTH DAILY, Disp: 90 tablet, Rfl: 3 .  Loperamide HCl (IMODIUM A-D PO), Take by mouth as needed., Disp: , Rfl:  .  Melatonin 10 MG TABS, Take by mouth at bedtime., Disp: , Rfl:  .  metFORMIN (GLUCOPHAGE) 500 MG tablet, TAKE 1 TABLET BY MOUTH TWICE DAILY, Disp: 180 tablet, Rfl: 3 .  Phenylephrine-DM-GG (MUCINEX FAST-MAX CONGEST COUGH PO), Take by mouth as needed., Disp: , Rfl:  .  rosuvastatin (CRESTOR) 10 MG tablet, TAKE 1 TABLET BY MOUTH DAILY, Disp: 90 tablet, Rfl: 3 .  sertraline (ZOLOFT) 100  MG tablet, TAKE 1 TABLET BY MOUTH TWICE DAILY, Disp: 180 tablet, Rfl: 2  Current Facility-Administered Medications:  .  lidocaine (PF) (XYLOCAINE) 1 % injection 2 mL, 2 mL, Intradermal, Once, Holli Rengel, MD .  triamcinolone acetonide (KENALOG-40) injection 40 mg, 40 mg, Intramuscular, Once, Corky Downs, MD   Allergies  Allergen Reactions  . Iodine Swelling    ANGIOEDEMA FACIAL SWELLING "BETADINE OKAY"  . Shellfish Allergy Anaphylaxis    ROS Review of Systems  Constitutional: Negative.   HENT: Negative.   Eyes: Negative.   Respiratory: Negative.   Cardiovascular: Negative.   Gastrointestinal: Negative.   Endocrine: Negative.   Genitourinary: Negative.   Musculoskeletal: Negative.   Skin: Negative.  Allergic/Immunologic: Negative.   Neurological: Negative.   Hematological: Negative.   Psychiatric/Behavioral: Negative.   All other systems reviewed and are negative.     Objective:    Physical Exam Vitals reviewed.  Constitutional:      Appearance: Normal appearance.  HENT:     Mouth/Throat:     Mouth: Mucous membranes are moist.  Eyes:     Pupils: Pupils are equal, round, and reactive to light.  Neck:     Vascular: No carotid bruit.  Cardiovascular:     Rate and Rhythm: Normal rate and regular rhythm.     Pulses: Normal pulses.     Heart sounds: Murmur heard.   Systolic murmur is present with a grade of 2/6.   Pulmonary:     Effort: Pulmonary effort is normal.     Breath sounds: Normal breath sounds.  Abdominal:     General: Bowel sounds are normal.     Palpations: Abdomen is soft. There is no hepatomegaly, splenomegaly or mass.     Tenderness: There is no abdominal tenderness.     Hernia: No hernia is present.  Musculoskeletal:        General: No tenderness.     Cervical back: Neck supple.     Right lower leg: No edema.     Left lower leg: No edema.  Skin:    Findings: No rash.  Neurological:     Mental Status: She is alert and oriented to  person, place, and time.     Motor: No weakness.  Psychiatric:        Mood and Affect: Mood and affect normal.        Behavior: Behavior normal.     There were no vitals taken for this visit. Wt Readings from Last 3 Encounters:  10/19/20 115 lb 6.4 oz (52.3 kg)  09/05/20 123 lb 6.4 oz (56 kg)  08/24/20 125 lb (56.7 kg)     Health Maintenance Due  Topic Date Due  . Hepatitis C Screening  Never done  . COVID-19 Vaccine (1) Never done  . FOOT EXAM  Never done  . OPHTHALMOLOGY EXAM  Never done  . URINE MICROALBUMIN  Never done  . TETANUS/TDAP  Never done  . COLONOSCOPY (Pts 45-25yrs Insurance coverage will need to be confirmed)  Never done  . MAMMOGRAM  Never done  . DEXA SCAN  Never done  . PNA vac Low Risk Adult (2 of 2 - PCV13) 05/23/2019    There are no preventive care reminders to display for this patient.  Lab Results  Component Value Date   TSH 2.450 05/23/2020   Lab Results  Component Value Date   WBC 4.1 01/17/2020   HGB 12.9 01/17/2020   HCT 39.1 01/17/2020   MCV 87.9 01/17/2020   PLT 87 (L) 01/17/2020   Lab Results  Component Value Date   NA 138 05/23/2020   K 4.8 05/23/2020   CO2 24 05/23/2020   GLUCOSE 67 05/23/2020   BUN 10 05/23/2020   CREATININE 0.83 05/23/2020   BILITOT 0.4 05/23/2020   ALKPHOS 95 05/23/2020   AST 43 (H) 05/23/2020   ALT 22 05/23/2020   PROT 7.2 05/23/2020   ALBUMIN 3.9 05/23/2020   CALCIUM 9.5 05/23/2020   ANIONGAP 8 06/06/2019   Lab Results  Component Value Date   CHOL 92 01/15/2017   Lab Results  Component Value Date   HDL 36 (L) 01/15/2017   Lab Results  Component Value Date  LDLCALC 37 01/15/2017   Lab Results  Component Value Date   TRIG 97 01/15/2017   Lab Results  Component Value Date   CHOLHDL 2.6 01/15/2017   Lab Results  Component Value Date   HGBA1C 6.2 (A) 10/19/2020      Assessment & Plan:   Problem List Items Addressed This Visit      Cardiovascular and Mediastinum   Severe aortic  stenosis - Primary    Patient is status post aortic valve replacement.  She has a residual murmur in the left side      Essential hypertension    Patient blood pressure is normal patient denies any chest pain or shortness of breath there is no history of palpitation paroxysmal nocturnal dyspnea patient can walk  25yards without any problem patient was advised to follow low-salt low-cholesterol diet  I reviewed the results of Sprint trial  ideally I want to keep systolic blood pressure below 939 mmHg, patient was asked to check blood pressure 3 times a week and give me a report on that.  Patient will be follow-up in 3 months, patient will call me back for any change in the cardiovascular symptoms           Endocrine   Diabetes (HCC) (Chronic)    - The patient's blood sugar is under control on med. - The patient will continue the current treatment regimen.  - I encouraged the patient to regularly check blood sugar.  - I encouraged the patient to monitor diet. I encouraged the patient to eat low-carb and low-sugar to help prevent blood sugar spikes.  - I encouraged the patient to continue following their prescribed treatment plan for diabetes - I informed the patient to get help if blood sugar drops below 54mg /dL, or if suddenly have trouble thinking clearly or breathing.         Musculoskeletal and Integument   Osteoporosis    Stable at the present time        Other   Anxiety    - Patient experiencing high levels of anxiety.  - Encouraged patient to engage in relaxing activities like yoga, meditation, journaling, going for a walk, or participating in a hobby.  - Encouraged patient to reach out to trusted friends or family members about recent struggles      Relevant Medications   ALPRAZolam (XANAX) 0.5 MG tablet   Memory loss    Patient is on Aricept also she is under care of neurologist         Meds ordered this encounter  Medications  . ALPRAZolam (XANAX) 0.5 MG tablet     Sig: Take 1 tablet (0.5 mg total) by mouth 2 (two) times daily.    Dispense:  60 tablet    Refill:  0    Follow-up: No follow-ups on file.    Corky Downs, MD

## 2020-10-19 NOTE — Assessment & Plan Note (Signed)

## 2020-10-19 NOTE — Assessment & Plan Note (Signed)
Patient is on Aricept also she is under care of neurologist

## 2020-10-30 ENCOUNTER — Other Ambulatory Visit: Payer: Self-pay | Admitting: Internal Medicine

## 2020-11-15 ENCOUNTER — Other Ambulatory Visit: Payer: Self-pay | Admitting: Internal Medicine

## 2020-11-20 ENCOUNTER — Encounter: Payer: Self-pay | Admitting: Internal Medicine

## 2020-11-20 ENCOUNTER — Ambulatory Visit (INDEPENDENT_AMBULATORY_CARE_PROVIDER_SITE_OTHER): Payer: Medicare Other | Admitting: Internal Medicine

## 2020-11-20 ENCOUNTER — Other Ambulatory Visit: Payer: Self-pay

## 2020-11-20 ENCOUNTER — Other Ambulatory Visit: Payer: Self-pay | Admitting: *Deleted

## 2020-11-20 VITALS — BP 140/72 | HR 61 | Ht 59.0 in | Wt 121.4 lb

## 2020-11-20 DIAGNOSIS — I208 Other forms of angina pectoris: Secondary | ICD-10-CM

## 2020-11-20 DIAGNOSIS — E063 Autoimmune thyroiditis: Secondary | ICD-10-CM

## 2020-11-20 DIAGNOSIS — R413 Other amnesia: Secondary | ICD-10-CM

## 2020-11-20 DIAGNOSIS — K589 Irritable bowel syndrome without diarrhea: Secondary | ICD-10-CM

## 2020-11-20 DIAGNOSIS — E038 Other specified hypothyroidism: Secondary | ICD-10-CM | POA: Diagnosis not present

## 2020-11-20 DIAGNOSIS — M12812 Other specific arthropathies, not elsewhere classified, left shoulder: Secondary | ICD-10-CM | POA: Diagnosis not present

## 2020-11-20 DIAGNOSIS — E118 Type 2 diabetes mellitus with unspecified complications: Secondary | ICD-10-CM | POA: Diagnosis not present

## 2020-11-20 DIAGNOSIS — Z794 Long term (current) use of insulin: Secondary | ICD-10-CM

## 2020-11-20 DIAGNOSIS — G6289 Other specified polyneuropathies: Secondary | ICD-10-CM

## 2020-11-20 DIAGNOSIS — M069 Rheumatoid arthritis, unspecified: Secondary | ICD-10-CM | POA: Diagnosis not present

## 2020-11-20 DIAGNOSIS — F419 Anxiety disorder, unspecified: Secondary | ICD-10-CM

## 2020-11-20 DIAGNOSIS — I10 Essential (primary) hypertension: Secondary | ICD-10-CM

## 2020-11-20 DIAGNOSIS — E119 Type 2 diabetes mellitus without complications: Secondary | ICD-10-CM

## 2020-11-20 DIAGNOSIS — I5022 Chronic systolic (congestive) heart failure: Secondary | ICD-10-CM

## 2020-11-20 MED ORDER — ALPRAZOLAM 0.5 MG PO TABS: 0.5000 mg | ORAL_TABLET | Freq: Two times a day (BID) | ORAL | 0 refills | Status: DC

## 2020-11-20 NOTE — Assessment & Plan Note (Signed)
Patient takes her thyroid medication

## 2020-11-20 NOTE — Assessment & Plan Note (Signed)
-   Patient experiencing high levels of anxiety.  - Encouraged patient to engage in relaxing activities like yoga, meditation, journaling, going for a walk, or participating in a hobby.  - Encouraged patient to reach out to trusted friends or family members about recent struggles 

## 2020-11-20 NOTE — Assessment & Plan Note (Signed)
Blood pressure is stable 

## 2020-11-20 NOTE — Assessment & Plan Note (Addendum)
Congestive heart failure is stable on the present medication.  There is no gain in the weight  chest is clear  heart is regular  soft systolic murmur is audible left sternal border

## 2020-11-20 NOTE — Assessment & Plan Note (Signed)
Stable

## 2020-11-20 NOTE — Assessment & Plan Note (Signed)
Patient does not have any more angina at the present

## 2020-11-20 NOTE — Assessment & Plan Note (Signed)
Patient diabetes is under control

## 2020-11-20 NOTE — Assessment & Plan Note (Signed)
Patient takes Aricept, she is under care of neurologist

## 2020-11-20 NOTE — Assessment & Plan Note (Signed)
Patient is being followed up by neurologist. 

## 2020-11-20 NOTE — Assessment & Plan Note (Signed)
Both shoulder cuff syndrome are stable

## 2020-11-20 NOTE — Progress Notes (Signed)
Established Patient Office Visit  Subjective:  Patient ID: Jenny [name], female    DOB: February 07, 1946  Age: 75 y.o. MRN: [id number]  CC:  Chief Complaint  Patient presents with  . Diabetes  . Anxiety    HPI  Jenny [name] presents for loosing grip rt hand  And weakness of rt leg, her angina  Is stable,  thrombocytopenia is under control,  congestive heart failure is stable  Arthritis is under control  Past Medical History:  Diagnosis Date  . Anxiety   . Arthritis   . CAD (coronary artery disease)    a. 01/2017 Cath: nonobs dzs.  . Chronic systolic CHF (congestive heart failure) (HCC)    a. TTE 12/2016, EF 45-50%, probable hypokinesis of the mid apical anteroseptal, anterior, & apical myocardium, GR2DD, possibly bicuspid aortic valve that was moderately thickened w/ severely calcified leaflets, severe aortic stenosis w/ mean gradient 47 mmHg, valve area 0.52 cm, mild MR, mildly dilated LA  . Depression   . Diabetes mellitus with complication (HCC)    Tylenol  . Hyperlipidemia   . Hypothyroidism   . IBS (irritable bowel syndrome)   . Iron deficiency anemia    a. s/p pRBC x1 in 12/2016  . Microcytic anemia   . Osteoporosis   . Panic attacks   . Severe aortic stenosis    a. TTE 12/2016: possibly bicuspid aortic valve that was moderately thickened with severely calcified leaflets. There was severe aortic stenosis with a mean gradient of 47 mmHg and valve area of 0.52 cm; b. 01/2017 s/p AVR w/ 19 mm bioprosthetic Magna Ease pericardial tissue valve, ser# [id number].  . Stroke (HCC) 2001  . TIA (transient ischemic attack) 2006    Past Surgical History:  Procedure Laterality Date  . ABDOMINAL HYSTERECTOMY    . AORTIC VALVE REPLACEMENT N/A 02/17/2017   Procedure: AORTIC VALVE REPLACEMENT (AVR);  Surgeon: Donata Clay, Theron Arista, MD;  Location: [facility] OR;  Service: Open Heart Surgery;  Laterality: N/A;  . CATARACT EXTRACTION W/PHACO Right 04/09/2016   Procedure: CATARACT EXTRACTION  PHACO AND INTRAOCULAR LENS PLACEMENT (IOC);  Surgeon: Galen Manila, MD;  Location: [facility] ORS;  Service: Ophthalmology;  Laterality: Right;  [place] 57.2 AP% 18.6 CDE 10.64 Fluid Pack lot # [id number]  . CATARACT EXTRACTION W/PHACO Left 12/29/2018   Procedure: CATARACT EXTRACTION PHACO AND INTRAOCULAR LENS PLACEMENT (IOC)  LEFT DIABETIC;  Surgeon: Galen Manila, MD;  Location: [facility];  Service: Ophthalmology;  Laterality: Left;  Diabetic - insuli and oral meds  . CHOLECYSTECTOMY    . COLONOSCOPY    . CORONARY ANGIOGRAPHY N/A 02/03/2017   Procedure: CORONARY ANGIOGRAPHY;  Surgeon: Iran Ouch, MD;  Location: [facility];  Service: Cardiovascular;  Laterality: N/A;  . FRACTURE SURGERY     LEFT ANKLE  . RIGHT AND LEFT HEART CATH Bilateral 02/03/2017   Procedure: Right and Left Heart Cath;  Surgeon: Iran Ouch, MD;  Location: [facility];  Service: Cardiovascular;  Laterality: Bilateral;  . TEE WITHOUT CARDIOVERSION N/A 02/17/2017   Procedure: TRANSESOPHAGEAL ECHOCARDIOGRAM (TEE);  Surgeon: Donata Clay, Theron Arista, MD;  Location: [facility] OR;  Service: Open Heart Surgery;  Laterality: N/A;  . TIBIA IM NAIL INSERTION Left 05/19/2018   Procedure: INTRAMEDULLARY (IM) NAIL TIBIAL;  Surgeon: Juanell Fairly, MD;  Location: [facility] ORS;  Service: Orthopedics;  Laterality: Left;    Family History  Problem Relation Age of Onset  . Hypertension Mother     Social History  Socioeconomic History  . Marital status: Married    Spouse name: Not on file  . Number of children: Not on file  . Years of education: Not on file  . Highest education level: Not on file  Occupational History  . Not on file  Tobacco Use  . Smoking status: Former Smoker    Years: 30.00    Quit date: 02/04/2000    Years since quitting: 20.8  . Smokeless tobacco: Never Used  Vaping Use  . Vaping Use: Never used  Substance and Sexual Activity  . Alcohol use: No  . Drug use: No  . Sexual activity: Not  Currently  Other Topics Concern  . Not on file  Social History Narrative  . Not on file   Social Determinants of Health   Financial Resource Strain: Not on file  Food Insecurity: Not on file  Transportation Needs: Not on file  Physical Activity: Not on file  Stress: Not on file  Social Connections: Not on file  Intimate Partner Violence: Not on file     Current Outpatient Medications:  .  alendronate (FOSAMAX) 70 MG tablet, Take 70 mg by mouth once a week. Take with a full glass of water on an empty stomach., Disp: , Rfl:  .  ALPRAZolam (XANAX) 0.5 MG tablet, Take 1 tablet (0.5 mg total) by mouth 2 (two) times daily., Disp: 60 tablet, Rfl: 0 .  Cholecalciferol (VITAMIN D3 PO), Take by mouth daily., Disp: , Rfl:  .  donepezil (ARICEPT) 10 MG tablet, Take 1 tablet (10 mg total) by mouth at bedtime. Take 1/2 pill each evening., Disp: 90 tablet, Rfl: 1 .  hydrOXYzine (ATARAX/VISTARIL) 25 MG tablet, Take 25 mg by mouth 2 (two) times daily. , Disp: , Rfl:  .  insulin glargine (LANTUS) 100 UNIT/ML injection, Inject 25-35 Units into the skin See admin instructions. 35 units every morning and 25 units at bedtime, Disp: , Rfl:  .  Insulin Syringe-Needle U-100 (INSULIN SYRINGE .5CC/31GX5/16") 31G X 5/16" 0.5 ML MISC, USE 1 NEEDLE TWICE DAILY, Disp: 100 each, Rfl: 6 .  JANUVIA 100 MG tablet, TAKE 1 TABLET BY MOUTH EVERY MORNING, Disp: 90 tablet, Rfl: 3 .  levothyroxine (SYNTHROID) 88 MCG tablet, TAKE 1 TABLET BY MOUTH DAILY, Disp: 90 tablet, Rfl: 3 .  Loperamide HCl (IMODIUM A-D PO), Take by mouth as needed., Disp: , Rfl:  .  Melatonin 10 MG TABS, Take by mouth at bedtime., Disp: , Rfl:  .  metFORMIN (GLUCOPHAGE) 500 MG tablet, TAKE 1 TABLET BY MOUTH TWICE DAILY, Disp: 180 tablet, Rfl: 3 .  ONETOUCH ULTRA test strip, TEST THREE TIMES DAILY, Disp: 100 strip, Rfl: 3 .  Phenylephrine-DM-GG (MUCINEX FAST-MAX CONGEST COUGH PO), Take by mouth as needed., Disp: , Rfl:  .  rosuvastatin (CRESTOR) 10 MG  tablet, TAKE 1 TABLET BY MOUTH DAILY, Disp: 90 tablet, Rfl: 3 .  sertraline (ZOLOFT) 100 MG tablet, TAKE 1 TABLET BY MOUTH TWICE DAILY, Disp: 180 tablet, Rfl: 2  Current Facility-Administered Medications:  .  lidocaine (PF) (XYLOCAINE) 1 % injection 2 mL, 2 mL, Intradermal, Once, Jaselynn Tamas, MD .  triamcinolone acetonide (KENALOG-40) injection 40 mg, 40 mg, Intramuscular, Once, Corky Downs, MD   Allergies  Allergen Reactions  . Iodine Swelling    ANGIOEDEMA FACIAL SWELLING "BETADINE OKAY"  . Shellfish Allergy Anaphylaxis    ROS Review of Systems  Constitutional: Negative.   HENT: Negative.   Eyes: Negative.   Respiratory: Negative.   Cardiovascular: Negative.  Gastrointestinal: Negative.   Endocrine: Negative.   Genitourinary: Negative.   Musculoskeletal: Negative.   Skin: Negative.   Allergic/Immunologic: Negative.   Neurological: Negative.   Hematological: Negative.   Psychiatric/Behavioral: Negative.   All other systems reviewed and are negative.     Objective:    Physical Exam Vitals reviewed.  Constitutional:      Appearance: Normal appearance.  HENT:     Mouth/Throat:     Mouth: Mucous membranes are moist.  Eyes:     Pupils: Pupils are equal, round, and reactive to light.  Neck:     Vascular: No carotid bruit.  Cardiovascular:     Rate and Rhythm: Normal rate and regular rhythm.     Pulses: Normal pulses.     Heart sounds: Murmur heard.    Pulmonary:     Effort: Pulmonary effort is normal.     Breath sounds: Normal breath sounds.  Abdominal:     General: Bowel sounds are normal.     Palpations: Abdomen is soft. There is no hepatomegaly, splenomegaly or mass.     Tenderness: There is no abdominal tenderness.     Hernia: No hernia is present.  Musculoskeletal:        General: No tenderness.     Cervical back: Neck supple.     Right lower leg: No edema.     Left lower leg: No edema.  Skin:    Findings: No rash.  Neurological:     Mental  Status: She is alert and oriented to person, place, and time.     Motor: No weakness.  Psychiatric:        Mood and Affect: Mood and affect normal.        Behavior: Behavior normal.     BP 140/72   Pulse 61   Ht 4\' 11"  (1.499 m)   Wt 121 lb 6.4 oz (55.1 kg)   BMI 24.52 kg/m  Wt Readings from Last 3 Encounters:  11/20/20 121 lb 6.4 oz (55.1 kg)  10/19/20 115 lb 6.4 oz (52.3 kg)  09/05/20 123 lb 6.4 oz (56 kg)     Health Maintenance Due  Topic Date Due  . COVID-19 Vaccine (1) Never done  . FOOT EXAM  Never done  . OPHTHALMOLOGY EXAM  Never done  . URINE MICROALBUMIN  Never done  . Hepatitis C Screening  Never done  . TETANUS/TDAP  Never done  . COLONOSCOPY (Pts 45-82yrs Insurance coverage will need to be confirmed)  Never done  . MAMMOGRAM  Never done  . DEXA SCAN  Never done  . PNA vac Low Risk Adult (2 of 2 - PCV13) 05/23/2019    There are no preventive care reminders to display for this patient.  Lab Results  Component Value Date   TSH 2.450 05/23/2020   Lab Results  Component Value Date   WBC 4.1 01/17/2020   HGB 12.9 01/17/2020   HCT 39.1 01/17/2020   MCV 87.9 01/17/2020   PLT 87 (L) 01/17/2020   Lab Results  Component Value Date   NA 138 05/23/2020   K 4.8 05/23/2020   CO2 24 05/23/2020   GLUCOSE 67 05/23/2020   BUN 10 05/23/2020   CREATININE 0.83 05/23/2020   BILITOT 0.4 05/23/2020   ALKPHOS 95 05/23/2020   AST 43 (H) 05/23/2020   ALT 22 05/23/2020   PROT 7.2 05/23/2020   ALBUMIN 3.9 05/23/2020   CALCIUM 9.5 05/23/2020   ANIONGAP 8 06/06/2019   Lab Results  Component Value Date   CHOL 92 01/15/2017   Lab Results  Component Value Date   HDL 36 (L) 01/15/2017   Lab Results  Component Value Date   LDLCALC 37 01/15/2017   Lab Results  Component Value Date   TRIG 97 01/15/2017   Lab Results  Component Value Date   CHOLHDL 2.6 01/15/2017   Lab Results  Component Value Date   HGBA1C 6.2 (A) 10/19/2020      Assessment & Plan:    Problem List Items Addressed This Visit      Cardiovascular and Mediastinum   Other forms of angina pectoris (HCC)    Patient does not have any more angina at the present      Chronic systolic CHF (congestive heart failure) (HCC)    Congestive heart failure is stable on the present medication.  There is no gain in the weight  chest is clear  heart is regular  soft systolic murmur is audible left sternal border      Essential hypertension - Primary    Blood pressure is stable.        Digestive   Irritable bowel syndrome    IBS is under control on medicine        Endocrine   Type 2 diabetes mellitus with complication, without long-term current use of insulin (HCC)    Patient diabetes is under control.      Hypothyroidism    Patient takes her thyroid medication        Nervous and Auditory   Axonal neuropathy    Patient is being followed up by neurologist.        Musculoskeletal and Integument   Rotator cuff arthropathy of left shoulder    Both shoulder cuff syndrome are stable      Rheumatoid arthritis (HCC)    Stable        Other   Memory loss    Patient takes Aricept, she is under care of neurologist         No orders of the defined types were placed in this encounter.   Follow-up: No follow-ups on file.    Corky Downs, MD

## 2020-11-20 NOTE — Assessment & Plan Note (Signed)
IBS is under control on medicine

## 2020-12-04 DIAGNOSIS — E113213 Type 2 diabetes mellitus with mild nonproliferative diabetic retinopathy with macular edema, bilateral: Secondary | ICD-10-CM | POA: Diagnosis not present

## 2020-12-19 ENCOUNTER — Other Ambulatory Visit: Payer: Self-pay

## 2020-12-19 ENCOUNTER — Ambulatory Visit (INDEPENDENT_AMBULATORY_CARE_PROVIDER_SITE_OTHER): Payer: Medicare Other | Admitting: Internal Medicine

## 2020-12-19 ENCOUNTER — Encounter: Payer: Self-pay | Admitting: Internal Medicine

## 2020-12-19 VITALS — BP 136/72 | HR 87 | Ht 59.0 in | Wt 119.6 lb

## 2020-12-19 DIAGNOSIS — K589 Irritable bowel syndrome without diarrhea: Secondary | ICD-10-CM

## 2020-12-19 DIAGNOSIS — E063 Autoimmune thyroiditis: Secondary | ICD-10-CM | POA: Diagnosis not present

## 2020-12-19 DIAGNOSIS — I10 Essential (primary) hypertension: Secondary | ICD-10-CM

## 2020-12-19 DIAGNOSIS — E038 Other specified hypothyroidism: Secondary | ICD-10-CM

## 2020-12-19 DIAGNOSIS — G6289 Other specified polyneuropathies: Secondary | ICD-10-CM | POA: Diagnosis not present

## 2020-12-19 DIAGNOSIS — E1169 Type 2 diabetes mellitus with other specified complication: Secondary | ICD-10-CM

## 2020-12-19 DIAGNOSIS — E118 Type 2 diabetes mellitus with unspecified complications: Secondary | ICD-10-CM | POA: Diagnosis not present

## 2020-12-19 DIAGNOSIS — Z794 Long term (current) use of insulin: Secondary | ICD-10-CM

## 2020-12-19 DIAGNOSIS — R413 Other amnesia: Secondary | ICD-10-CM

## 2020-12-19 DIAGNOSIS — F419 Anxiety disorder, unspecified: Secondary | ICD-10-CM | POA: Diagnosis not present

## 2020-12-19 DIAGNOSIS — E119 Type 2 diabetes mellitus without complications: Secondary | ICD-10-CM

## 2020-12-19 LAB — GLUCOSE, POCT (MANUAL RESULT ENTRY): POC Glucose: 187 mg/dl — AB (ref 70–99)

## 2020-12-19 MED ORDER — ALPRAZOLAM 0.5 MG PO TABS: 0.5000 mg | ORAL_TABLET | Freq: Two times a day (BID) | ORAL | 0 refills | Status: DC

## 2020-12-19 NOTE — Assessment & Plan Note (Signed)
Blood sugar is under control, patient ate ice cream yesterday

## 2020-12-19 NOTE — Assessment & Plan Note (Signed)
-   Patient experiencing high levels of anxiety.  - Encouraged patient to engage in relaxing activities like yoga, meditation, journaling, going for a walk, or participating in a hobby.  - Encouraged patient to reach out to trusted friends or family members about recent struggles 

## 2020-12-19 NOTE — Progress Notes (Signed)
Established Patient Office Visit  Subjective:  Patient ID: Jenny [name], female    DOB: [birth year]  Age: 75 y.o. MRN: [id number]  CC:  Chief Complaint  Patient presents with   Diabetes   Hypertension   Anxiety    Patient needs refill of xanax     HPI  Jenny [name] presents for patient is known to have Alzheimer's disease and is on Aricept.  Complains of weakness of the right hand and anxiety, feels good most of the time.  She has been referred to rheumatology for elevated IgA and elevated rheumatoid factor she does not smoke does not drink.  Patient blood sugar is labile because of  irregular diet control  Past Medical History:  Diagnosis Date   Anxiety    Arthritis    CAD (coronary artery disease)    a. 01/2017 Cath: nonobs dzs.   Chronic systolic CHF (congestive heart failure) (HCC)    a. TTE 12/2016, EF 45-50%, probable hypokinesis of the mid apical anteroseptal, anterior, & apical myocardium, GR2DD, possibly bicuspid aortic valve that was moderately thickened w/ severely calcified leaflets, severe aortic stenosis w/ mean gradient 47 mmHg, valve area 0.52 cm, mild MR, mildly dilated LA   Depression    Diabetes mellitus with complication (HCC)    Tylenol   Hyperlipidemia    Hypothyroidism    IBS (irritable bowel syndrome)    Iron deficiency anemia    a. s/p pRBC x1 in 12/2016   Microcytic anemia    Osteoporosis    Panic attacks    Severe aortic stenosis    a. TTE 12/2016: possibly bicuspid aortic valve that was moderately thickened with severely calcified leaflets. There was severe aortic stenosis with a mean gradient of 47 mmHg and valve area of 0.52 cm; b. 01/2017 s/p AVR w/ 19 mm bioprosthetic Magna Ease pericardial tissue valve, ser# [id number].   Stroke [facility]) 2001   TIA (transient ischemic attack) 2006    Past Surgical History:  Procedure Laterality Date   ABDOMINAL HYSTERECTOMY     AORTIC VALVE REPLACEMENT N/A 02/17/2017   Procedure: AORTIC VALVE REPLACEMENT  (AVR);  Surgeon: Donata Clay, Theron Arista, MD;  Location: [facility] OR;  Service: Open Heart Surgery;  Laterality: N/A;   CATARACT EXTRACTION W/PHACO Right 04/09/2016   Procedure: CATARACT EXTRACTION PHACO AND INTRAOCULAR LENS PLACEMENT (IOC);  Surgeon: Galen Manila, MD;  Location: [facility] ORS;  Service: Ophthalmology;  Laterality: Right;  [place] 57.2 AP% 18.6 CDE 10.64 Fluid Pack lot # [id number]   CATARACT EXTRACTION W/PHACO Left 12/29/2018   Procedure: CATARACT EXTRACTION PHACO AND INTRAOCULAR LENS PLACEMENT (IOC)  LEFT DIABETIC;  Surgeon: Galen Manila, MD;  Location: [facility];  Service: Ophthalmology;  Laterality: Left;  Diabetic - insuli and oral meds   CHOLECYSTECTOMY     COLONOSCOPY     CORONARY ANGIOGRAPHY N/A 02/03/2017   Procedure: CORONARY ANGIOGRAPHY;  Surgeon: Iran Ouch, MD;  Location: [facility] INVASIVE CV LAB;  Service: Cardiovascular;  Laterality: N/A;   FRACTURE SURGERY     LEFT ANKLE   RIGHT AND LEFT HEART CATH Bilateral 02/03/2017   Procedure: Right and Left Heart Cath;  Surgeon: Iran Ouch, MD;  Location: [facility] INVASIVE CV LAB;  Service: Cardiovascular;  Laterality: Bilateral;   TEE WITHOUT CARDIOVERSION N/A 02/17/2017   Procedure: TRANSESOPHAGEAL ECHOCARDIOGRAM (TEE);  Surgeon: Donata Clay, Theron Arista, MD;  Location: [facility] OR;  Service: Open Heart Surgery;  Laterality: N/A;   TIBIA IM NAIL INSERTION Left 05/19/2018   Procedure: INTRAMEDULLARY (  IM) NAIL TIBIAL;  Surgeon: Juanell Fairly, MD;  Location: [facility] ORS;  Service: Orthopedics;  Laterality: Left;    Family History  Problem Relation Age of Onset   Hypertension Mother     Social History   Socioeconomic History   Marital status: Married    Spouse name: Not on file   Number of children: Not on file   Years of education: Not on file   Highest education level: Not on file  Occupational History   Not on file  Tobacco Use   Smoking status: Former    Years: 30.00    Pack years: 0.00    Types: Cigarettes    Quit date:  02/04/2000    Years since quitting: 20.8   Smokeless tobacco: Never  Vaping Use   Vaping Use: Never used  Substance and Sexual Activity   Alcohol use: No   Drug use: No   Sexual activity: Not Currently  Other Topics Concern   Not on file  Social History Narrative   Not on file   Social Determinants of Health   Financial Resource Strain: Not on file  Food Insecurity: Not on file  Transportation Needs: Not on file  Physical Activity: Not on file  Stress: Not on file  Social Connections: Not on file  Intimate Partner Violence: Not on file     Current Outpatient Medications:    alendronate (FOSAMAX) 70 MG tablet, Take 70 mg by mouth once a week. Take with a full glass of water on an empty stomach., Disp: , Rfl:    Cholecalciferol (VITAMIN D3 PO), Take by mouth daily., Disp: , Rfl:    donepezil (ARICEPT) 10 MG tablet, Take 1 tablet (10 mg total) by mouth at bedtime. Take 1/2 pill each evening., Disp: 90 tablet, Rfl: 1   hydrOXYzine (ATARAX/VISTARIL) 25 MG tablet, Take 25 mg by mouth 2 (two) times daily. , Disp: , Rfl:    insulin glargine (LANTUS) 100 UNIT/ML injection, Inject 25-35 Units into the skin See admin instructions. 35 units every morning and 25 units at bedtime, Disp: , Rfl:    Insulin Syringe-Needle U-100 (INSULIN SYRINGE .5CC/31GX5/16") 31G X 5/16" 0.5 ML MISC, USE 1 NEEDLE TWICE DAILY, Disp: 100 each, Rfl: 6   JANUVIA 100 MG tablet, TAKE 1 TABLET BY MOUTH EVERY MORNING, Disp: 90 tablet, Rfl: 3   levothyroxine (SYNTHROID) 88 MCG tablet, TAKE 1 TABLET BY MOUTH DAILY, Disp: 90 tablet, Rfl: 3   Loperamide HCl (IMODIUM A-D PO), Take by mouth as needed., Disp: , Rfl:    Melatonin 10 MG TABS, Take by mouth at bedtime., Disp: , Rfl:    metFORMIN (GLUCOPHAGE) 500 MG tablet, TAKE 1 TABLET BY MOUTH TWICE DAILY, Disp: 180 tablet, Rfl: 3   ONETOUCH ULTRA test strip, TEST THREE TIMES DAILY, Disp: 100 strip, Rfl: 3   Phenylephrine-DM-GG (MUCINEX FAST-MAX CONGEST COUGH PO), Take by mouth  as needed., Disp: , Rfl:    rosuvastatin (CRESTOR) 10 MG tablet, TAKE 1 TABLET BY MOUTH DAILY, Disp: 90 tablet, Rfl: 3   sertraline (ZOLOFT) 100 MG tablet, TAKE 1 TABLET BY MOUTH TWICE DAILY, Disp: 180 tablet, Rfl: 2   ALPRAZolam (XANAX) 0.5 MG tablet, Take 1 tablet (0.5 mg total) by mouth 2 (two) times daily., Disp: 60 tablet, Rfl: 0  Current Facility-Administered Medications:    lidocaine (PF) (XYLOCAINE) 1 % injection 2 mL, 2 mL, Intradermal, Once, Zayon Trulson, Renda Rolls, MD   triamcinolone acetonide (KENALOG-40) injection 40 mg, 40 mg, Intramuscular, Once, Corky Downs, MD  Allergies  Allergen Reactions   Iodine Swelling    ANGIOEDEMA FACIAL SWELLING "BETADINE OKAY"   Shellfish Allergy Anaphylaxis    ROS Review of Systems    Objective:    Physical Exam  BP 136/72   Pulse 87   Ht  (1.499 m)   Wt 119 lb 9.6 oz (54.3 kg)   BMI 24.16 kg/m  Wt Readings from Last 3 Encounters:  12/19/20 119 lb 9.6 oz (54.3 kg)  11/20/20 121 lb 6.4 oz (55.1 kg)  10/19/20 115 lb 6.4 oz (52.3 kg)     Health Maintenance Due  Topic Date Due   COVID-19 Vaccine (1) Never done   FOOT EXAM  Never done   OPHTHALMOLOGY EXAM  Never done   URINE MICROALBUMIN  Never done   Hepatitis C Screening  Never done   TETANUS/TDAP  Never done   COLONOSCOPY (Pts 45-17yrs Insurance coverage will need to be confirmed)  Never done   MAMMOGRAM  Never done   Zoster Vaccines- Shingrix (1 of 2) Never done   DEXA SCAN  Never done   PNA vac Low Risk Adult (2 of 2 - PCV13) 05/23/2019    There are no preventive care reminders to display for this patient.  Lab Results  Component Value Date   TSH 2.450 05/23/2020   Lab Results  Component Value Date   WBC 4.1 01/17/2020   HGB 12.9 01/17/2020   HCT 39.1 01/17/2020   MCV 87.9 01/17/2020   PLT 87 (L) 01/17/2020   Lab Results  Component Value Date   NA 138 05/23/2020   K 4.8 05/23/2020   CO2 24 05/23/2020   GLUCOSE 67 05/23/2020   BUN 10 05/23/2020    CREATININE 0.83 05/23/2020   BILITOT 0.4 05/23/2020   ALKPHOS 95 05/23/2020   AST 43 (H) 05/23/2020   ALT 22 05/23/2020   PROT 7.2 05/23/2020   ALBUMIN 3.9 05/23/2020   CALCIUM 9.5 05/23/2020   ANIONGAP 8 06/06/2019   Lab Results  Component Value Date   CHOL 92 01/15/2017   Lab Results  Component Value Date   HDL 36 (L) 01/15/2017   Lab Results  Component Value Date   LDLCALC 37 01/15/2017   Lab Results  Component Value Date   TRIG 97 01/15/2017   Lab Results  Component Value Date   CHOLHDL 2.6 01/15/2017   Lab Results  Component Value Date   HGBA1C 6.2 (A) 10/19/2020      Assessment & Plan:   Problem List Items Addressed This Visit       Cardiovascular and Mediastinum   Essential hypertension    Blood pressure is under control on the present medication         Digestive   Irritable bowel syndrome    She denies any diarrhea or constipation.  Denies any bloating         Endocrine   Type 2 diabetes mellitus with complication, without long-term current use of insulin (HCC)    Blood sugar is under control, patient ate ice cream yesterday       Hypothyroidism    Stable         Nervous and Auditory   Axonal neuropathy     Other   Anxiety    - Patient experiencing high levels of anxiety.  - Encouraged patient to engage in relaxing activities like yoga, meditation, journaling, going for a walk, or participating in a hobby.  - Encouraged patient to reach out to trusted  friends or family members about recent struggles       Relevant Medications   ALPRAZolam (XANAX) 0.5 MG tablet   Memory loss    Patient was referred back to neurologist for memory loss and weakness of the right hand.       Other Visit Diagnoses     Type 2 diabetes mellitus without complication, with long-term current use of insulin (HCC)    -  Primary   Relevant Orders   POCT glucose (manual entry) (Completed)       Meds ordered this encounter  Medications    ALPRAZolam (XANAX) 0.5 MG tablet    Sig: Take 1 tablet (0.5 mg total) by mouth 2 (two) times daily.    Dispense:  60 tablet    Refill:  0    Follow-up: No follow-ups on file.    Corky Downs, MD

## 2020-12-19 NOTE — Assessment & Plan Note (Signed)
Stable

## 2020-12-19 NOTE — Assessment & Plan Note (Signed)
She denies any diarrhea or constipation.  Denies any bloating

## 2020-12-19 NOTE — Assessment & Plan Note (Signed)
Blood pressure is under control on the present medication

## 2020-12-19 NOTE — Assessment & Plan Note (Signed)
Patient was referred back to neurologist for memory loss and weakness of the right hand.

## 2020-12-25 ENCOUNTER — Telehealth: Payer: Self-pay | Admitting: Family Medicine

## 2020-12-25 NOTE — Telephone Encounter (Signed)
I called pt.  She is having more R fingers and palm numbness (neuropathy).  She states started about 2 wks after she was here in 08/2020.  L hand is dominant.  It is getting aggravating.  Last noted referral to hematology and rheumatolgy was offered and she refused at that time.  Wanted sooner appt to address.   Wants afternoon appt.

## 2020-12-25 NOTE — Telephone Encounter (Signed)
Pt's husband, [name] (on [place]) called, her neuropathy is getting worse. Would like to speak with the nurse if she could get a sooner appt.

## 2020-12-25 NOTE — Telephone Encounter (Signed)
I called pt.  I offered her several apptsthis week, her husband cannot take her.  I told her to call and check on cancellations with the phone staff and they may be able to get her in in the affernoon with AL/NP .  She appreciated call back.

## 2020-12-26 ENCOUNTER — Other Ambulatory Visit: Payer: Self-pay | Admitting: *Deleted

## 2020-12-26 MED ORDER — METHYLPREDNISOLONE 4 MG PO TBPK: ORAL_TABLET | ORAL | 0 refills | Status: DC

## 2021-01-16 ENCOUNTER — Ambulatory Visit (INDEPENDENT_AMBULATORY_CARE_PROVIDER_SITE_OTHER): Payer: Medicare Other | Admitting: Internal Medicine

## 2021-01-16 ENCOUNTER — Other Ambulatory Visit: Payer: Self-pay

## 2021-01-16 ENCOUNTER — Encounter: Payer: Self-pay | Admitting: Internal Medicine

## 2021-01-16 VITALS — BP 112/60 | HR 65 | Ht 59.0 in | Wt 119.1 lb

## 2021-01-16 DIAGNOSIS — E118 Type 2 diabetes mellitus with unspecified complications: Secondary | ICD-10-CM

## 2021-01-16 DIAGNOSIS — I5022 Chronic systolic (congestive) heart failure: Secondary | ICD-10-CM

## 2021-01-16 DIAGNOSIS — M069 Rheumatoid arthritis, unspecified: Secondary | ICD-10-CM

## 2021-01-16 DIAGNOSIS — F419 Anxiety disorder, unspecified: Secondary | ICD-10-CM

## 2021-01-16 DIAGNOSIS — G6289 Other specified polyneuropathies: Secondary | ICD-10-CM

## 2021-01-16 DIAGNOSIS — I10 Essential (primary) hypertension: Secondary | ICD-10-CM

## 2021-01-16 DIAGNOSIS — E119 Type 2 diabetes mellitus without complications: Secondary | ICD-10-CM

## 2021-01-16 DIAGNOSIS — Z794 Long term (current) use of insulin: Secondary | ICD-10-CM | POA: Diagnosis not present

## 2021-01-16 LAB — GLUCOSE, POCT (MANUAL RESULT ENTRY): POC Glucose: 200 mg/dl — AB (ref 70–99)

## 2021-01-16 MED ORDER — ALPRAZOLAM 0.5 MG PO TABS: 0.5000 mg | ORAL_TABLET | Freq: Two times a day (BID) | ORAL | 0 refills | Status: DC

## 2021-01-16 NOTE — Assessment & Plan Note (Signed)
-   Patient experiencing high levels of anxiety.  - Encouraged patient to engage in relaxing activities like yoga, meditation, journaling, going for a walk, or participating in a hobby.  - Encouraged patient to reach out to trusted friends or family members about recent struggles, Patient was advised to read A book, how to stop worrying and start living, it is good book to read to control  the stress  

## 2021-01-16 NOTE — Assessment & Plan Note (Signed)

## 2021-01-16 NOTE — Assessment & Plan Note (Signed)
Refer to rheumatology  

## 2021-01-16 NOTE — Assessment & Plan Note (Signed)
Blood pressure is under control 

## 2021-01-16 NOTE — Assessment & Plan Note (Signed)
Patient is having increased symptoms of neuropathy we will refer her to neurologist.

## 2021-01-16 NOTE — Progress Notes (Signed)
Established Patient Office Visit  Subjective:  Patient ID: Jenny SCHWENKER, female    DOB: 1946-04-22  Age: 75 y.o. MRN: [id number]  CC:  Chief Complaint  Patient presents with   Anxiety    Med Refill    Anxiety     Jenny [name] presents for complaint of increased symptoms of neuropathy  Past Medical History:  Diagnosis Date   Anxiety    Arthritis    CAD (coronary artery disease)    a. 01/2017 Cath: nonobs dzs.   Chronic systolic CHF (congestive heart failure) (HCC)    a. TTE 12/2016, EF 45-50%, probable hypokinesis of the mid apical anteroseptal, anterior, & apical myocardium, GR2DD, possibly bicuspid aortic valve that was moderately thickened w/ severely calcified leaflets, severe aortic stenosis w/ mean gradient 47 mmHg, valve area 0.52 cm, mild MR, mildly dilated LA   Depression    Diabetes mellitus with complication (HCC)    Tylenol   Hyperlipidemia    Hypothyroidism    IBS (irritable bowel syndrome)    Iron deficiency anemia    a. s/p pRBC x1 in 12/2016   Microcytic anemia    Osteoporosis    Panic attacks    Severe aortic stenosis    a. TTE 12/2016: possibly bicuspid aortic valve that was moderately thickened with severely calcified leaflets. There was severe aortic stenosis with a mean gradient of 47 mmHg and valve area of 0.52 cm; b. 01/2017 s/p AVR w/ 19 mm bioprosthetic Magna Ease pericardial tissue valve, ser# [id number].   Stroke [facility]) 2001   TIA (transient ischemic attack) 2006    Past Surgical History:  Procedure Laterality Date   ABDOMINAL HYSTERECTOMY     AORTIC VALVE REPLACEMENT N/A 02/17/2017   Procedure: AORTIC VALVE REPLACEMENT (AVR);  Surgeon: Donata Clay, Theron Arista, MD;  Location: [facility] OR;  Service: Open Heart Surgery;  Laterality: N/A;   CATARACT EXTRACTION W/PHACO Right 04/09/2016   Procedure: CATARACT EXTRACTION PHACO AND INTRAOCULAR LENS PLACEMENT (IOC);  Surgeon: Galen Manila, MD;  Location: [facility] ORS;  Service: Ophthalmology;  Laterality:  Right;  [place] 57.2 AP% 18.6 CDE 10.64 Fluid Pack lot # [id number]   CATARACT EXTRACTION W/PHACO Left 12/29/2018   Procedure: CATARACT EXTRACTION PHACO AND INTRAOCULAR LENS PLACEMENT (IOC)  LEFT DIABETIC;  Surgeon: Galen Manila, MD;  Location: [facility];  Service: Ophthalmology;  Laterality: Left;  Diabetic - insuli and oral meds   CHOLECYSTECTOMY     COLONOSCOPY     CORONARY ANGIOGRAPHY N/A 02/03/2017   Procedure: CORONARY ANGIOGRAPHY;  Surgeon: Iran Ouch, MD;  Location: [facility] INVASIVE CV LAB;  Service: Cardiovascular;  Laterality: N/A;   FRACTURE SURGERY     LEFT ANKLE   RIGHT AND LEFT HEART CATH Bilateral 02/03/2017   Procedure: Right and Left Heart Cath;  Surgeon: Iran Ouch, MD;  Location: [facility] INVASIVE CV LAB;  Service: Cardiovascular;  Laterality: Bilateral;   TEE WITHOUT CARDIOVERSION N/A 02/17/2017   Procedure: TRANSESOPHAGEAL ECHOCARDIOGRAM (TEE);  Surgeon: Donata Clay, Theron Arista, MD;  Location: [facility] OR;  Service: Open Heart Surgery;  Laterality: N/A;   TIBIA IM NAIL INSERTION Left 05/19/2018   Procedure: INTRAMEDULLARY (IM) NAIL TIBIAL;  Surgeon: Juanell Fairly, MD;  Location: [facility] ORS;  Service: Orthopedics;  Laterality: Left;    Family History  Problem Relation Age of Onset   Hypertension Mother     Social History   Socioeconomic History   Marital status: Married    Spouse name: Not on file   Number of children:  Not on file   Years of education: Not on file   Highest education level: Not on file  Occupational History   Not on file  Tobacco Use   Smoking status: Former    Years: 30.00    Types: Cigarettes    Quit date: 02/04/2000    Years since quitting: 20.9   Smokeless tobacco: Never  Vaping Use   Vaping Use: Never used  Substance and Sexual Activity   Alcohol use: No   Drug use: No   Sexual activity: Not Currently  Other Topics Concern   Not on file  Social History Narrative   Not on file   Social Determinants of Health   Financial  Resource Strain: Not on file  Food Insecurity: Not on file  Transportation Needs: Not on file  Physical Activity: Not on file  Stress: Not on file  Social Connections: Not on file  Intimate Partner Violence: Not on file     Current Outpatient Medications:    alendronate (FOSAMAX) 70 MG tablet, Take 70 mg by mouth once a week. Take with a full glass of water on an empty stomach., Disp: , Rfl:    ALPRAZolam (XANAX) 0.5 MG tablet, Take 1 tablet (0.5 mg total) by mouth 2 (two) times daily., Disp: 60 tablet, Rfl: 0   Cholecalciferol (VITAMIN D3 PO), Take by mouth daily., Disp: , Rfl:    donepezil (ARICEPT) 10 MG tablet, Take 1 tablet (10 mg total) by mouth at bedtime. Take 1/2 pill each evening., Disp: 90 tablet, Rfl: 1   hydrOXYzine (ATARAX/VISTARIL) 25 MG tablet, Take 25 mg by mouth 2 (two) times daily. , Disp: , Rfl:    insulin glargine (LANTUS) 100 UNIT/ML injection, Inject 25-35 Units into the skin See admin instructions. 35 units every morning and 25 units at bedtime, Disp: , Rfl:    Insulin Syringe-Needle U-100 (INSULIN SYRINGE .5CC/31GX5/16") 31G X 5/16" 0.5 ML MISC, USE 1 NEEDLE TWICE DAILY, Disp: 100 each, Rfl: 6   JANUVIA 100 MG tablet, TAKE 1 TABLET BY MOUTH EVERY MORNING, Disp: 90 tablet, Rfl: 3   levothyroxine (SYNTHROID) 88 MCG tablet, TAKE 1 TABLET BY MOUTH DAILY, Disp: 90 tablet, Rfl: 3   Loperamide HCl (IMODIUM A-D PO), Take by mouth as needed., Disp: , Rfl:    Melatonin 10 MG TABS, Take by mouth at bedtime., Disp: , Rfl:    metFORMIN (GLUCOPHAGE) 500 MG tablet, TAKE 1 TABLET BY MOUTH TWICE DAILY, Disp: 180 tablet, Rfl: 3   methylPREDNISolone (MEDROL DOSEPAK) 4 MG TBPK tablet, Use as directed, Disp: 21 tablet, Rfl: 0   ONETOUCH ULTRA test strip, TEST THREE TIMES DAILY, Disp: 100 strip, Rfl: 3   Phenylephrine-DM-GG (MUCINEX FAST-MAX CONGEST COUGH PO), Take by mouth as needed., Disp: , Rfl:    rosuvastatin (CRESTOR) 10 MG tablet, TAKE 1 TABLET BY MOUTH DAILY, Disp: 90 tablet,  Rfl: 3   sertraline (ZOLOFT) 100 MG tablet, TAKE 1 TABLET BY MOUTH TWICE DAILY, Disp: 180 tablet, Rfl: 2  Current Facility-Administered Medications:    lidocaine (PF) (XYLOCAINE) 1 % injection 2 mL, 2 mL, Intradermal, Once, Kerron Sedano, Renda Rolls, MD   triamcinolone acetonide (KENALOG-40) injection 40 mg, 40 mg, Intramuscular, Once, Corky Downs, MD   Allergies  Allergen Reactions   Iodine Swelling    ANGIOEDEMA FACIAL SWELLING "BETADINE OKAY"   Shellfish Allergy Anaphylaxis    ROS Review of Systems  Constitutional: Negative.   HENT: Negative.    Eyes: Negative.   Respiratory: Negative.    Cardiovascular:  Negative.   Gastrointestinal: Negative.   Endocrine: Negative.   Genitourinary: Negative.   Musculoskeletal: Negative.   Skin: Negative.   Allergic/Immunologic: Negative.   Neurological: Negative.   Hematological: Negative.   Psychiatric/Behavioral: Negative.    All other systems reviewed and are negative.    Objective:    Physical Exam Vitals reviewed.  Constitutional:      Appearance: Normal appearance.  HENT:     Mouth/Throat:     Mouth: Mucous membranes are moist.     Pharynx: No oropharyngeal exudate.  Eyes:     Pupils: Pupils are equal, round, and reactive to light.  Neck:     Vascular: No carotid bruit.  Cardiovascular:     Rate and Rhythm: Normal rate and regular rhythm.     Pulses: Normal pulses.     Heart sounds: Normal heart sounds.  Pulmonary:     Effort: Pulmonary effort is normal.     Breath sounds: Normal breath sounds.  Abdominal:     General: Bowel sounds are normal.     Palpations: Abdomen is soft. There is no hepatomegaly, splenomegaly or mass.     Tenderness: There is no abdominal tenderness.     Hernia: No hernia is present.  Musculoskeletal:        General: No tenderness. Normal range of motion.     Cervical back: Neck supple.     Right lower leg: No edema.     Left lower leg: No edema.  Skin:    Coloration: Skin is not jaundiced.      Findings: No rash.  Neurological:     Mental Status: She is alert and oriented to person, place, and time. Mental status is at baseline.     Motor: No weakness.  Psychiatric:        Mood and Affect: Mood and affect normal.        Behavior: Behavior normal.        Judgment: Judgment normal.    BP 112/60   Pulse 65   Ht  (1.499 m)   Wt 119 lb 1.6 oz (54 kg)   BMI 24.06 kg/m  Wt Readings from Last 3 Encounters:  01/16/21 119 lb 1.6 oz (54 kg)  12/19/20 119 lb 9.6 oz (54.3 kg)  11/20/20 121 lb 6.4 oz (55.1 kg)     Health Maintenance Due  Topic Date Due   FOOT EXAM  Never done   OPHTHALMOLOGY EXAM  Never done   URINE MICROALBUMIN  Never done   Hepatitis C Screening  Never done   TETANUS/TDAP  Never done   COLONOSCOPY (Pts 45-3yrs Insurance coverage will need to be confirmed)  Never done   MAMMOGRAM  Never done   Zoster Vaccines- Shingrix (2 of 2) 04/07/2013   DEXA SCAN  Never done   PNA vac Low Risk Adult (2 of 2 - PCV13) 05/23/2019   COVID-19 Vaccine (4 - Booster for Pfizer series) 09/21/2020    There are no preventive care reminders to display for this patient.  Lab Results  Component Value Date   TSH 2.450 05/23/2020   Lab Results  Component Value Date   WBC 4.1 01/17/2020   HGB 12.9 01/17/2020   HCT 39.1 01/17/2020   MCV 87.9 01/17/2020   PLT 87 (L) 01/17/2020   Lab Results  Component Value Date   NA 138 05/23/2020   K 4.8 05/23/2020   CO2 24 05/23/2020   GLUCOSE 67 05/23/2020   BUN 10 05/23/2020  CREATININE 0.83 05/23/2020   BILITOT 0.4 05/23/2020   ALKPHOS 95 05/23/2020   AST 43 (H) 05/23/2020   ALT 22 05/23/2020   PROT 7.2 05/23/2020   ALBUMIN 3.9 05/23/2020   CALCIUM 9.5 05/23/2020   ANIONGAP 8 06/06/2019   Lab Results  Component Value Date   CHOL 92 01/15/2017   Lab Results  Component Value Date   HDL 36 (L) 01/15/2017   Lab Results  Component Value Date   LDLCALC 37 01/15/2017   Lab Results  Component Value Date   TRIG 97  01/15/2017   Lab Results  Component Value Date   CHOLHDL 2.6 01/15/2017   Lab Results  Component Value Date   HGBA1C 6.2 (A) 10/19/2020      Assessment & Plan:   Problem List Items Addressed This Visit       Cardiovascular and Mediastinum   Chronic systolic CHF (congestive heart failure) (HCC)    Stable at the present time       Essential hypertension    Blood pressure is under control.         Endocrine   Type 2 diabetes mellitus with complication, without long-term current use of insulin (HCC)    - The patient's blood sugar is labile on med. - The patient will continue the current treatment regimen.  - I encouraged the patient to regularly check blood sugar.  - I encouraged the patient to monitor diet. I encouraged the patient to eat low-carb and low-sugar to help prevent blood sugar spikes.  - I encouraged the patient to continue following their prescribed treatment plan for diabetes - I informed the patient to get help if blood sugar drops below 54mg /dL, or if suddenly have trouble thinking clearly or breathing.  Patient was advised to buy a book on diabetes from a local bookstore or from .  Patient should read 2 chapters every day to keep the motivation going, this is in addition to some of the materials we provided them from the office.  There are other resources on the Internet like YouTube and wilkipedia to get an education on the diabetes         Nervous and Auditory   Axonal neuropathy    Patient is having increased symptoms of neuropathy we will refer her to neurologist.         Musculoskeletal and Integument   Rheumatoid arthritis (HCC)    Refer to rheumatology         Other   Anxiety    - Patient experiencing high levels of anxiety.  - Encouraged patient to engage in relaxing activities like yoga, meditation, journaling, going for a walk, or participating in a hobby.  - Encouraged patient to reach out to trusted friends or family members  about recent struggles, Patient was advised to read A book, how to stop worrying and start living, it is good book to read to control  the stress        Relevant Medications   ALPRAZolam (XANAX) 0.5 MG tablet   Annual physical exam   Other Visit Diagnoses     Type 2 diabetes mellitus without complication, with long-term current use of insulin (HCC)    -  Primary   Relevant Orders   POCT glucose (manual entry) (Completed)       Meds ordered this encounter  Medications   ALPRAZolam (XANAX) 0.5 MG tablet    Sig: Take 1 tablet (0.5 mg total) by mouth 2 (two) times daily.  Dispense:  60 tablet    Refill:  0    Follow-up: No follow-ups on file.    Corky Downs, MD

## 2021-01-16 NOTE — Assessment & Plan Note (Signed)
Stable at the present time. 

## 2021-01-25 ENCOUNTER — Ambulatory Visit: Payer: Medicare Other | Admitting: Neurology

## 2021-02-08 ENCOUNTER — Other Ambulatory Visit: Payer: Self-pay | Admitting: *Deleted

## 2021-02-08 DIAGNOSIS — Z1231 Encounter for screening mammogram for malignant neoplasm of breast: Secondary | ICD-10-CM

## 2021-02-19 ENCOUNTER — Other Ambulatory Visit: Payer: Self-pay

## 2021-02-19 ENCOUNTER — Ambulatory Visit (INDEPENDENT_AMBULATORY_CARE_PROVIDER_SITE_OTHER): Payer: Medicare Other | Admitting: Internal Medicine

## 2021-02-19 ENCOUNTER — Encounter: Payer: Self-pay | Admitting: Internal Medicine

## 2021-02-19 DIAGNOSIS — G6289 Other specified polyneuropathies: Secondary | ICD-10-CM | POA: Diagnosis not present

## 2021-02-19 DIAGNOSIS — E063 Autoimmune thyroiditis: Secondary | ICD-10-CM

## 2021-02-19 DIAGNOSIS — E118 Type 2 diabetes mellitus with unspecified complications: Secondary | ICD-10-CM | POA: Diagnosis not present

## 2021-02-19 DIAGNOSIS — I208 Other forms of angina pectoris: Secondary | ICD-10-CM

## 2021-02-19 DIAGNOSIS — F419 Anxiety disorder, unspecified: Secondary | ICD-10-CM

## 2021-02-19 DIAGNOSIS — I350 Nonrheumatic aortic (valve) stenosis: Secondary | ICD-10-CM | POA: Diagnosis not present

## 2021-02-19 DIAGNOSIS — K589 Irritable bowel syndrome without diarrhea: Secondary | ICD-10-CM

## 2021-02-19 DIAGNOSIS — M12812 Other specific arthropathies, not elsewhere classified, left shoulder: Secondary | ICD-10-CM | POA: Diagnosis not present

## 2021-02-19 DIAGNOSIS — E038 Other specified hypothyroidism: Secondary | ICD-10-CM | POA: Diagnosis not present

## 2021-02-19 DIAGNOSIS — I10 Essential (primary) hypertension: Secondary | ICD-10-CM

## 2021-02-19 LAB — POCT GLYCOSYLATED HEMOGLOBIN (HGB A1C): Hemoglobin A1C: 5.6 % (ref 4.0–5.6)

## 2021-02-19 LAB — GLUCOSE, POCT (MANUAL RESULT ENTRY): POC Glucose: 91 mg/dl (ref 70–99)

## 2021-02-19 MED ORDER — ALPRAZOLAM 0.5 MG PO TABS: 0.5000 mg | ORAL_TABLET | Freq: Two times a day (BID) | ORAL | 0 refills | Status: DC

## 2021-02-19 NOTE — Assessment & Plan Note (Signed)

## 2021-02-19 NOTE — Assessment & Plan Note (Signed)

## 2021-02-19 NOTE — Assessment & Plan Note (Signed)
Referred to neurology.

## 2021-02-19 NOTE — Assessment & Plan Note (Signed)
Status post AVR doing well

## 2021-02-19 NOTE — Assessment & Plan Note (Signed)
patient complaining of left shoulder pain, will give her a cortisone shot, he was advised to do some shoulder exercises.

## 2021-02-19 NOTE — Progress Notes (Signed)
Established Patient Office Visit  Subjective:  Patient ID: Jenny [name], female    DOB: 07-28-1945  Age: 75 y.o. MRN: [id number]  CC:  Chief Complaint  Patient presents with  . Medication Refill    Medication Refill   [name] presents for lt shoulder pain  Past Medical History:  Diagnosis Date  . Anxiety   . Arthritis   . CAD (coronary artery disease)    a. 01/2017 Cath: nonobs dzs.  . Chronic systolic CHF (congestive heart failure) (HCC)    a. TTE 12/2016, EF 45-50%, probable hypokinesis of the mid apical anteroseptal, anterior, & apical myocardium, GR2DD, possibly bicuspid aortic valve that was moderately thickened w/ severely calcified leaflets, severe aortic stenosis w/ mean gradient 47 mmHg, valve area 0.52 cm, mild MR, mildly dilated LA  . Depression   . Diabetes mellitus with complication (HCC)    Tylenol  . Hyperlipidemia   . Hypothyroidism   . IBS (irritable bowel syndrome)   . Iron deficiency anemia    a. s/p pRBC x1 in 12/2016  . Microcytic anemia   . Osteoporosis   . Panic attacks   . Severe aortic stenosis    a. TTE 12/2016: possibly bicuspid aortic valve that was moderately thickened with severely calcified leaflets. There was severe aortic stenosis with a mean gradient of 47 mmHg and valve area of 0.52 cm; b. 01/2017 s/p AVR w/ 19 mm bioprosthetic Magna Ease pericardial tissue valve, ser# [id number].  . Stroke (HCC) 2001  . TIA (transient ischemic attack) 2006    Past Surgical History:  Procedure Laterality Date  . ABDOMINAL HYSTERECTOMY    . AORTIC VALVE REPLACEMENT N/A 02/17/2017   Procedure: AORTIC VALVE REPLACEMENT (AVR);  Surgeon: Donata Clay, Theron Arista, MD;  Location: [facility] OR;  Service: Open Heart Surgery;  Laterality: N/A;  . CATARACT EXTRACTION W/PHACO Right 04/09/2016   Procedure: CATARACT EXTRACTION PHACO AND INTRAOCULAR LENS PLACEMENT (IOC);  Surgeon: Galen Manila, MD;  Location: [facility] ORS;  Service: Ophthalmology;  Laterality: Right;  [place]  57.2 AP% 18.6 CDE 10.64 Fluid Pack lot # [id number]  . CATARACT EXTRACTION W/PHACO Left 12/29/2018   Procedure: CATARACT EXTRACTION PHACO AND INTRAOCULAR LENS PLACEMENT (IOC)  LEFT DIABETIC;  Surgeon: Galen Manila, MD;  Location: [facility];  Service: Ophthalmology;  Laterality: Left;  Diabetic - insuli and oral meds  . CHOLECYSTECTOMY    . COLONOSCOPY    . CORONARY ANGIOGRAPHY N/A 02/03/2017   Procedure: CORONARY ANGIOGRAPHY;  Surgeon: Iran Ouch, MD;  Location: [facility] INVASIVE CV LAB;  Service: Cardiovascular;  Laterality: N/A;  . FRACTURE SURGERY     LEFT ANKLE  . RIGHT AND LEFT HEART CATH Bilateral 02/03/2017   Procedure: Right and Left Heart Cath;  Surgeon: Iran Ouch, MD;  Location: [facility] INVASIVE CV LAB;  Service: Cardiovascular;  Laterality: Bilateral;  . TEE WITHOUT CARDIOVERSION N/A 02/17/2017   Procedure: TRANSESOPHAGEAL ECHOCARDIOGRAM (TEE);  Surgeon: Donata Clay, Theron Arista, MD;  Location: [facility] OR;  Service: Open Heart Surgery;  Laterality: N/A;  . TIBIA IM NAIL INSERTION Left 05/19/2018   Procedure: INTRAMEDULLARY (IM) NAIL TIBIAL;  Surgeon: Juanell Fairly, MD;  Location: [facility] ORS;  Service: Orthopedics;  Laterality: Left;    Family History  Problem Relation Age of Onset  . Hypertension Mother     Social History   Socioeconomic History  . Marital status: Married    Spouse name: Not on file  . Number of children: Not on file  . Years of education:  Not on file  . Highest education level: Not on file  Occupational History  . Not on file  Tobacco Use  . Smoking status: Former    Years: 30.00    Types: Cigarettes    Quit date: 02/04/2000    Years since quitting: 21.0  . Smokeless tobacco: Never  Vaping Use  . Vaping Use: Never used  Substance and Sexual Activity  . Alcohol use: No  . Drug use: No  . Sexual activity: Not Currently  Other Topics Concern  . Not on file  Social History Narrative  . Not on file   Social Determinants of Health    Financial Resource Strain: Not on file  Food Insecurity: Not on file  Transportation Needs: Not on file  Physical Activity: Not on file  Stress: Not on file  Social Connections: Not on file  Intimate Partner Violence: Not on file     Current Outpatient Medications:  .  alendronate (FOSAMAX) 70 MG tablet, Take 70 mg by mouth once a week. Take with a full glass of water on an empty stomach., Disp: , Rfl:  .  ALPRAZolam (XANAX) 0.5 MG tablet, Take 1 tablet (0.5 mg total) by mouth 2 (two) times daily., Disp: 60 tablet, Rfl: 0 .  Cholecalciferol (VITAMIN D3 PO), Take by mouth daily., Disp: , Rfl:  .  donepezil (ARICEPT) 10 MG tablet, Take 1 tablet (10 mg total) by mouth at bedtime. Take 1/2 pill each evening., Disp: 90 tablet, Rfl: 1 .  hydrOXYzine (ATARAX/VISTARIL) 25 MG tablet, Take 25 mg by mouth 2 (two) times daily. , Disp: , Rfl:  .  insulin glargine (LANTUS) 100 UNIT/ML injection, Inject 25-35 Units into the skin See admin instructions. 35 units every morning and 25 units at bedtime, Disp: , Rfl:  .  Insulin Syringe-Needle U-100 (INSULIN SYRINGE .5CC/31GX5/16") 31G X 5/16" 0.5 ML MISC, USE 1 NEEDLE TWICE DAILY, Disp: 100 each, Rfl: 6 .  JANUVIA 100 MG tablet, TAKE 1 TABLET BY MOUTH EVERY MORNING, Disp: 90 tablet, Rfl: 3 .  levothyroxine (SYNTHROID) 88 MCG tablet, TAKE 1 TABLET BY MOUTH DAILY, Disp: 90 tablet, Rfl: 3 .  Loperamide HCl (IMODIUM A-D PO), Take by mouth as needed., Disp: , Rfl:  .  Melatonin 10 MG TABS, Take by mouth at bedtime., Disp: , Rfl:  .  metFORMIN (GLUCOPHAGE) 500 MG tablet, TAKE 1 TABLET BY MOUTH TWICE DAILY, Disp: 180 tablet, Rfl: 3 .  methylPREDNISolone (MEDROL DOSEPAK) 4 MG TBPK tablet, Use as directed, Disp: 21 tablet, Rfl: 0 .  ONETOUCH ULTRA test strip, TEST THREE TIMES DAILY, Disp: 100 strip, Rfl: 3 .  Phenylephrine-DM-GG (MUCINEX FAST-MAX CONGEST COUGH PO), Take by mouth as needed., Disp: , Rfl:  .  rosuvastatin (CRESTOR) 10 MG tablet, TAKE 1 TABLET BY  MOUTH DAILY, Disp: 90 tablet, Rfl: 3 .  sertraline (ZOLOFT) 100 MG tablet, TAKE 1 TABLET BY MOUTH TWICE DAILY, Disp: 180 tablet, Rfl: 2  Current Facility-Administered Medications:  .  lidocaine (PF) (XYLOCAINE) 1 % injection 2 mL, 2 mL, Intradermal, Once, Elvia Aydin, MD .  triamcinolone acetonide (KENALOG-40) injection 40 mg, 40 mg, Intramuscular, Once, Corky Downs, MD   Allergies  Allergen Reactions  . Iodine Swelling    ANGIOEDEMA FACIAL SWELLING "BETADINE OKAY"  . Shellfish Allergy Anaphylaxis    ROS Review of Systems  Constitutional: Negative.   HENT: Negative.    Eyes: Negative.   Respiratory: Negative.    Cardiovascular: Negative.   Gastrointestinal: Negative.   Endocrine:  Negative.   Genitourinary: Negative.   Musculoskeletal: Negative.   Skin: Negative.   Allergic/Immunologic: Negative.   Neurological: Negative.   Hematological: Negative.   Psychiatric/Behavioral: Negative.    All other systems reviewed and are negative.    Objective:    Physical Exam Vitals reviewed.  Constitutional:      Appearance: Normal appearance.  HENT:     Mouth/Throat:     Mouth: Mucous membranes are moist.  Eyes:     Pupils: Pupils are equal, round, and reactive to light.  Neck:     Vascular: No carotid bruit.  Cardiovascular:     Rate and Rhythm: Normal rate and regular rhythm.     Pulses: Normal pulses.     Heart sounds: Normal heart sounds.  Pulmonary:     Effort: Pulmonary effort is normal.     Breath sounds: Normal breath sounds.  Abdominal:     General: Bowel sounds are normal.     Palpations: Abdomen is soft. There is no hepatomegaly, splenomegaly or mass.     Tenderness: There is no abdominal tenderness.     Hernia: No hernia is present.  Musculoskeletal:        General: No tenderness.     Cervical back: Neck supple.     Right lower leg: No edema.     Left lower leg: No edema.  Skin:    Findings: No rash.  Neurological:     Mental Status: She is alert  and oriented to person, place, and time.     Motor: No weakness.  Psychiatric:        Mood and Affect: Mood and affect normal.        Behavior: Behavior normal.    There were no vitals taken for this visit. Wt Readings from Last 3 Encounters:  01/16/21 119 lb 1.6 oz (54 kg)  12/19/20 119 lb 9.6 oz (54.3 kg)  11/20/20 121 lb 6.4 oz (55.1 kg)     Health Maintenance Due  Topic Date Due  . FOOT EXAM  Never done  . OPHTHALMOLOGY EXAM  Never done  . URINE MICROALBUMIN  Never done  . Hepatitis C Screening  Never done  . TETANUS/TDAP  Never done  . COLONOSCOPY (Pts 45-68yrs Insurance coverage will need to be confirmed)  Never done  . MAMMOGRAM  Never done  . Zoster Vaccines- Shingrix (2 of 2) 04/07/2013  . DEXA SCAN  Never done  . PNA vac Low Risk Adult (2 of 2 - PCV13) 05/23/2019  . COVID-19 Vaccine (4 - Booster for Pfizer series) 09/21/2020  . INFLUENZA VACCINE  01/29/2021    There are no preventive care reminders to display for this patient.  Lab Results  Component Value Date   TSH 2.450 05/23/2020   Lab Results  Component Value Date   WBC 4.1 01/17/2020   HGB 12.9 01/17/2020   HCT 39.1 01/17/2020   MCV 87.9 01/17/2020   PLT 87 (L) 01/17/2020   Lab Results  Component Value Date   NA 138 05/23/2020   K 4.8 05/23/2020   CO2 24 05/23/2020   GLUCOSE 67 05/23/2020   BUN 10 05/23/2020   CREATININE 0.83 05/23/2020   BILITOT 0.4 05/23/2020   ALKPHOS 95 05/23/2020   AST 43 (H) 05/23/2020   ALT 22 05/23/2020   PROT 7.2 05/23/2020   ALBUMIN 3.9 05/23/2020   CALCIUM 9.5 05/23/2020   ANIONGAP 8 06/06/2019   Lab Results  Component Value Date   CHOL 92 01/15/2017  Lab Results  Component Value Date   HDL 36 (L) 01/15/2017   Lab Results  Component Value Date   LDLCALC 37 01/15/2017   Lab Results  Component Value Date   TRIG 97 01/15/2017   Lab Results  Component Value Date   CHOLHDL 2.6 01/15/2017   Lab Results  Component Value Date   HGBA1C 5.6  02/19/2021      Assessment & Plan:   Problem List Items Addressed This Visit       Cardiovascular and Mediastinum   Severe aortic stenosis    Status post AVR doing well      Essential hypertension     Patient denies any chest pain or shortness of breath there is no history of palpitation or paroxysmal nocturnal dyspnea   patient was advised to follow low-salt low-cholesterol diet    ideally I want to keep systolic blood pressure below 175 mmHg, patient was asked to check blood pressure one times a week and give me a report on that.  Patient will be follow-up in 3 months  or earlier as needed, patient will call me back for any change in the cardiovascular symptoms Patient was advised to buy a book from local bookstore concerning blood pressure and read several chapters  every day.  This will be supplemented by some of the material we will give him from the office.  Patient should also utilize other resources like YouTube and Internet to learn more about the blood pressure and the diet.        Digestive   Irritable bowel syndrome     Endocrine   Type 2 diabetes mellitus with complication, without long-term current use of insulin (HCC) - Primary    - The patient's blood sugar is labile on med. - The patient will continue the current treatment regimen.  - I encouraged the patient to regularly check blood sugar.  - I encouraged the patient to monitor diet. I encouraged the patient to eat low-carb and low-sugar to help prevent blood sugar spikes.  - I encouraged the patient to continue following their prescribed treatment plan for diabetes - I informed the patient to get help if blood sugar drops below 54mg /dL, or if suddenly have trouble thinking clearly or breathing.  Patient was advised to buy a book on diabetes from a local bookstore or from .  Patient should read 2 chapters every day to keep the motivation going, this is in addition to some of the materials we provided them  from the office.  There are other resources on the Internet like YouTube and [employer]kip[employer] to get an education on the diabetes      Relevant Orders   POCT glucose (manual entry) (Completed)   POCT HgB A1C (Completed)   Hypothyroidism    Stable        Nervous and Auditory   Axonal neuropathy    Referred to neurology        Musculoskeletal and Integument   Rotator cuff arthropathy of left shoulder     patient complaining of left shoulder pain, will give her a cortisone shot, he was advised to do some shoulder exercises.        Other   Anxiety   Relevant Medications   ALPRAZolam (XANAX) 0.5 MG tablet    Meds ordered this encounter  Medications  . ALPRAZolam (XANAX) 0.5 MG tablet    Sig: Take 1 tablet (0.5 mg total) by mouth 2 (two) times daily.    Dispense:  60 tablet    Refill:  0  Joint Injection/Arthrocentesis  Date/Time: 02/19/2021 3:15 PM Performed by: Corky Downs, MD Authorized by: Corky Downs, MD  Indications: pain  Body area: shoulder Joint: left shoulder Local anesthesia used: yes  Anesthesia: Local anesthesia used: yes Local Anesthetic: lidocaine 1% with epinephrine Betamethasone amount: 40 mg Bupivacaine 0.25% amount: 0.5 mL Patient tolerance: patient tolerated the procedure well with no immediate complications Comments: Under completely sterile condition left shoulder was injected with a posterior approach and 40 mg of Kenalog was injected with bupivacaine  Patient tolerated the procedure well and she was sent to the waiting room stable condition she was advised to wait 5 minutes    Follow-up: No follow-ups on file.    Corky Downs, MD

## 2021-02-19 NOTE — Assessment & Plan Note (Signed)
Stable

## 2021-02-21 NOTE — Progress Notes (Deleted)
No chief complaint on file.    HISTORY OF PRESENT ILLNESS: 02/21/21 ALL:  Jenny [name] returns for follow up for [facility]. She has continued Aricept  daily.   08/24/2020 ALL:  Jenny [name] is a 75 y.o. female here today for follow up for mild neurocognitive impairment due to multiple etiologies including cerebrovascular disease and medication side effects. She was started on donepezil  at last visit with Dr Frances Furbish in 05/2020. Lab work showing positive RF, elevated IgA and AST. Referrals were placed to rheumatology and hematology. Patient has decided not to pursue additional workup. She does feel that Aricept has helped and has tolerated it well.  She feels "smarter". Her husband agrees that she seems to be a little sharper. She is doing more crossword puzzles. She is reading more. She is wanting to join the gym. She does not drive. She is independent with ADL's. She is sleeping from about 10-8. She has a good appetite. She loves to shop with her son. Anxiety continues to be her biggest concern. She is followed closely by PCP. She continues alprazolam 0.5mg  BID and sertraline  BID.    HISTORY (copied from Dr Teofilo Pod previous note)  Jenny [name] is a 75 year old right-handed woman with an underlying complex medical history of hypothyroidism, diabetes, anxiety, depression, history of panic attacks, osteoporosis, hypothyroidism, vitamin D deficiency, stroke, TIA, aortic stenosis, status post bovine aortic valve replacement in 2018, irritable bowel syndrome, hyperlipidemia, chronic systolic CHF, coronary artery disease, and arthritis, who presents for follow-up consultation of her memory loss and numbness in her feet. The patient is accompanied by her husband again today. She rescheduled an appointment from 05/08/20. I last saw her on 01/04/2020, at which time she felt about the same but her husband had noticed more mood irritability and also more forgetfulness.  She was still struggling with her  depression.  She reported numbness and tingling in both feet, no significant pain.  She did have elevated blood sugar values in the evenings.  She reported that her last A1c was less than 7.  We talked about her work-up thus far, EEG was normal in March 2021, neuropsychological evaluation in April 2021 with Dr. Clayborn Heron showed mild neurocognitive disorder that may be multifactorial.  He suspected that she may have minor chronic mild cognitive impairment but no findings particularly concerning for Alzheimer's dementia at the time.     She was advised to discuss mood related issues including depression with her psychiatrist, she had an upcoming appointment, per husband.   She was advised to proceed with an EMG and nerve conduction velocity test through our office.     She had an EMG/nerve conduction velocity test on 02/24/2020 and I reviewed the results: IMPRESSION:    This study demonstrates mild axonal sensorimotor polyneuropathy.   We called with the test results.   Today, 05/23/2020: She reports feeling stable, no new issues with her memory as far she is concerned. Her husband feels that she is fairly stable. Mood wise, she also feels stable. Her husband, she did see a psychiatrist a couple of times but he canceled the follow-up appointment. She has not had any new medications.  She continues to take Xanax for anxiety. She reports that she sees her primary care physician for this on a regular basis.   The patient's allergies, current medications, family history, past medical history, past social history, past surgical history and problem list were reviewed and updated as appropriate.  REVIEW OF SYSTEMS: Out of a complete 14 system review of symptoms, the patient complains only of the following symptoms, memory loss, anxiety and all other reviewed systems are negative.    ALLERGIES: Allergies  Allergen Reactions   Iodine Swelling    ANGIOEDEMA FACIAL SWELLING "BETADINE OKAY"    Shellfish Allergy Anaphylaxis     HOME MEDICATIONS: Outpatient Medications Prior to Visit  Medication Sig Dispense Refill   alendronate (FOSAMAX) 70 MG tablet Take 70 mg by mouth once a week. Take with a full glass of water on an empty stomach.     ALPRAZolam (XANAX) 0.5 MG tablet Take 1 tablet (0.5 mg total) by mouth 2 (two) times daily. 60 tablet 0   Cholecalciferol (VITAMIN D3 PO) Take by mouth daily.     donepezil (ARICEPT) 10 MG tablet Take 1 tablet (10 mg total) by mouth at bedtime. Take 1/2 pill each evening. 90 tablet 1   hydrOXYzine (ATARAX/VISTARIL) 25 MG tablet Take 25 mg by mouth 2 (two) times daily.      insulin glargine (LANTUS) 100 UNIT/ML injection Inject 25-35 Units into the skin See admin instructions. 35 units every morning and 25 units at bedtime     Insulin Syringe-Needle U-100 (INSULIN SYRINGE .5CC/31GX5/16") 31G X 5/16" 0.5 ML MISC USE 1 NEEDLE TWICE DAILY 100 each 6   JANUVIA 100 MG tablet TAKE 1 TABLET BY MOUTH EVERY MORNING 90 tablet 3   levothyroxine (SYNTHROID) 88 MCG tablet TAKE 1 TABLET BY MOUTH DAILY 90 tablet 3   Loperamide HCl (IMODIUM A-D PO) Take by mouth as needed.     Melatonin 10 MG TABS Take by mouth at bedtime.     metFORMIN (GLUCOPHAGE) 500 MG tablet TAKE 1 TABLET BY MOUTH TWICE DAILY 180 tablet 3   methylPREDNISolone (MEDROL DOSEPAK) 4 MG TBPK tablet Use as directed 21 tablet 0   ONETOUCH ULTRA test strip TEST THREE TIMES DAILY 100 strip 3   Phenylephrine-DM-GG (MUCINEX FAST-MAX CONGEST COUGH PO) Take by mouth as needed.     rosuvastatin (CRESTOR) 10 MG tablet TAKE 1 TABLET BY MOUTH DAILY 90 tablet 3   sertraline (ZOLOFT) 100 MG tablet TAKE 1 TABLET BY MOUTH TWICE DAILY 180 tablet 2   Facility-Administered Medications Prior to Visit  Medication Dose Route Frequency Provider Last Rate Last Admin   lidocaine (PF) (XYLOCAINE) 1 % injection 2 mL  2 mL Intradermal Once Corky Downs, MD       triamcinolone acetonide (KENALOG-40) injection 40 mg  40 mg  Intramuscular Once Corky Downs, MD         PAST MEDICAL HISTORY: Past Medical History:  Diagnosis Date   Anxiety    Arthritis    CAD (coronary artery disease)    a. 01/2017 Cath: nonobs dzs.   Chronic systolic CHF (congestive heart failure) (HCC)    a. TTE 12/2016, EF 45-50%, probable hypokinesis of the mid apical anteroseptal, anterior, & apical myocardium, GR2DD, possibly bicuspid aortic valve that was moderately thickened w/ severely calcified leaflets, severe aortic stenosis w/ mean gradient 47 mmHg, valve area 0.52 cm, mild MR, mildly dilated LA   Depression    Diabetes mellitus with complication (HCC)    Tylenol   Hyperlipidemia    Hypothyroidism    IBS (irritable bowel syndrome)    Iron deficiency anemia    a. s/p pRBC x1 in 12/2016   Microcytic anemia    Osteoporosis    Panic attacks    Severe aortic stenosis    a.  TTE 12/2016: possibly bicuspid aortic valve that was moderately thickened with severely calcified leaflets. There was severe aortic stenosis with a mean gradient of 47 mmHg and valve area of 0.52 cm; b. 01/2017 s/p AVR w/ 19 mm bioprosthetic Magna Ease pericardial tissue valve, ser# [id number].   Stroke [facility]) 2001   TIA (transient ischemic attack) 2006     PAST SURGICAL HISTORY: Past Surgical History:  Procedure Laterality Date   ABDOMINAL HYSTERECTOMY     AORTIC VALVE REPLACEMENT N/A 02/17/2017   Procedure: AORTIC VALVE REPLACEMENT (AVR);  Surgeon: Donata Clay, Theron Arista, MD;  Location: [facility] OR;  Service: Open Heart Surgery;  Laterality: N/A;   CATARACT EXTRACTION W/PHACO Right 04/09/2016   Procedure: CATARACT EXTRACTION PHACO AND INTRAOCULAR LENS PLACEMENT (IOC);  Surgeon: Galen Manila, MD;  Location: [facility] ORS;  Service: Ophthalmology;  Laterality: Right;  [place] 57.2 AP% 18.6 CDE 10.64 Fluid Pack lot # [id number]   CATARACT EXTRACTION W/PHACO Left 12/29/2018   Procedure: CATARACT EXTRACTION PHACO AND INTRAOCULAR LENS PLACEMENT (IOC)  LEFT DIABETIC;  Surgeon: Galen Manila, MD;  Location: [facility];  Service: Ophthalmology;  Laterality: Left;  Diabetic - insuli and oral meds   CHOLECYSTECTOMY     COLONOSCOPY     CORONARY ANGIOGRAPHY N/A 02/03/2017   Procedure: CORONARY ANGIOGRAPHY;  Surgeon: Iran Ouch, MD;  Location: [facility] INVASIVE CV LAB;  Service: Cardiovascular;  Laterality: N/A;   FRACTURE SURGERY     LEFT ANKLE   RIGHT AND LEFT HEART CATH Bilateral 02/03/2017   Procedure: Right and Left Heart Cath;  Surgeon: Iran Ouch, MD;  Location: [facility] INVASIVE CV LAB;  Service: Cardiovascular;  Laterality: Bilateral;   TEE WITHOUT CARDIOVERSION N/A 02/17/2017   Procedure: TRANSESOPHAGEAL ECHOCARDIOGRAM (TEE);  Surgeon: Donata Clay, Theron Arista, MD;  Location: [facility] OR;  Service: Open Heart Surgery;  Laterality: N/A;   TIBIA IM NAIL INSERTION Left 05/19/2018   Procedure: INTRAMEDULLARY (IM) NAIL TIBIAL;  Surgeon: Juanell Fairly, MD;  Location: [facility] ORS;  Service: Orthopedics;  Laterality: Left;     FAMILY HISTORY: Family History  Problem Relation Age of Onset   Hypertension Mother      SOCIAL HISTORY: Social History   Socioeconomic History   Marital status: Married    Spouse name: Not on file   Number of children: Not on file   Years of education: Not on file   Highest education level: Not on file  Occupational History   Not on file  Tobacco Use   Smoking status: Former    Years: 30.00    Types: Cigarettes    Quit date: 02/04/2000    Years since quitting: 21.0   Smokeless tobacco: Never  Vaping Use   Vaping Use: Never used  Substance and Sexual Activity   Alcohol use: No   Drug use: No   Sexual activity: Not Currently  Other Topics Concern   Not on file  Social History Narrative   Not on file   Social Determinants of Health   Financial Resource Strain: Not on file  Food Insecurity: Not on file  Transportation Needs: Not on file  Physical Activity: Not on file  Stress: Not on file  Social Connections: Not on file   Intimate Partner Violence: Not on file      PHYSICAL EXAM  There were no vitals filed for this visit.  There is no height or weight on file to calculate BMI.   Generalized: Well developed, in no acute distress  Cardiology: normal rate and rhythm,  no murmur auscultated  Respiratory: clear to auscultation bilaterally    Neurological examination  Mentation: Alert oriented to time, place, history taking. Follows all commands speech and language fluent Cranial nerve II-XII: Pupils were equal round reactive to light. Extraocular movements were full, visual field were full on confrontational test. Facial sensation and strength were normal. Head turning and shoulder shrug  were normal and symmetric. Motor: The motor testing reveals 5 over 5 strength of all 4 extremities. Good symmetric motor tone is noted throughout.  Sensory: Sensory testing is intact to soft touch on all 4 extremities. No evidence of extinction is noted.  Coordination: Cerebellar testing reveals good finger-nose-finger (very mild right ataxia) and heel-to-shin bilaterally.  Gait and station: Gait is stable with Rolator, Tandem not attempted     DIAGNOSTIC DATA (LABS, IMAGING, TESTING) - I reviewed patient records, labs, notes, testing and imaging myself where available.  Lab Results  Component Value Date   WBC 4.1 01/17/2020   HGB 12.9 01/17/2020   HCT 39.1 01/17/2020   MCV 87.9 01/17/2020   PLT 87 (L) 01/17/2020      Component Value Date/Time   NA 138 05/23/2020 1514   NA 134 (L) 10/24/2014 0613   K 4.8 05/23/2020 1514   K 4.4 10/24/2014 0613   CL 102 05/23/2020 1514   CL 105 10/24/2014 0613   CO2 24 05/23/2020 1514   CO2 23 10/24/2014 0613   GLUCOSE 67 05/23/2020 1514   GLUCOSE 164 (H) 06/06/2019 1312   GLUCOSE 293 (H) 10/24/2014 0613   BUN 10 05/23/2020 1514   BUN 11 10/24/2014 0613   CREATININE 0.83 05/23/2020 1514   CREATININE 0.60 10/24/2014 0613   CALCIUM 9.5 05/23/2020 1514   CALCIUM 8.6  (L) 10/24/2014 0613   PROT 7.2 05/23/2020 1514   PROT 7.7 03/02/2012 0634   ALBUMIN 3.9 05/23/2020 1514   ALBUMIN 3.2 (L) 03/02/2012 0634   AST 43 (H) 05/23/2020 1514   AST 26 03/02/2012 0634   ALT 22 05/23/2020 1514   ALT 21 03/02/2012 0634   ALKPHOS 95 05/23/2020 1514   ALKPHOS 103 03/02/2012 0634   BILITOT 0.4 05/23/2020 1514   BILITOT 0.3 03/02/2012 0634   GFRNONAA 71 05/23/2020 1514   GFRNONAA >60 10/24/2014 0613   GFRAA 82 05/23/2020 1514   GFRAA >60 10/24/2014 0613   Lab Results  Component Value Date   CHOL 92 01/15/2017   HDL 36 (L) 01/15/2017   LDLCALC 37 01/15/2017   TRIG 97 01/15/2017   CHOLHDL 2.6 01/15/2017   Lab Results  Component Value Date   HGBA1C 5.6 02/19/2021   Lab Results  Component Value Date   VITAMINB12 933 05/23/2020   Lab Results  Component Value Date   TSH 2.450 05/23/2020    MMSE - Mini Mental State Exam 08/24/2020 05/23/2020 09/02/2019  Orientation to time 4 5 5   Orientation to Place 5 5 5   Registration 3 3 3   Attention/ Calculation 1 2 3   Recall 3 3 2   Language- name 2 objects 2 2 2   Language- repeat 1 1 1   Language- follow 3 step command 2 3 3   Language- read & follow direction 1 1 1   Write a sentence 1 1 1   Copy design 1 1 1   Total score 24 27 27      Montreal Cognitive Assessment  10/06/2019  Visuospatial/ Executive (0/5) 3  Naming (0/3) 3  Attention: Read list of digits (0/2) 2  Attention: Read list of letters (0/1) 1  Attention: Serial 7 subtraction starting at 100 (0/3) 1  Language: Repeat phrase (0/2) 1  Language : Fluency (0/1) 0  Abstraction (0/2) 2  Delayed Recall (0/5) 5  Orientation (0/6) 6  Total 24  Adjusted Score (based on education) 25     ASSESSMENT AND PLAN  75 y.o. year old female  has a past medical history of Anxiety, Arthritis, CAD (coronary artery disease), Chronic systolic CHF (congestive heart failure) (HCC), Depression, Diabetes mellitus with complication (HCC), Hyperlipidemia, Hypothyroidism,  IBS (irritable bowel syndrome), Iron deficiency anemia, Microcytic anemia, Osteoporosis, Panic attacks, Severe aortic stenosis, Stroke (HCC) (2001), and TIA (transient ischemic attack) (2006). here with   No diagnosis found.  Jenny [name] feels that Aricept has been very helpful. MMSE does not represent any significant improvement, although, I suspect that a component of anxiety and difficulty with attention are contributing today. She is very talkative and easily distracted. She is tolerating Aricept. Her husband does feel that her memory does seem a little sharper, recently. We will increase dose to 10mg  daily. Side effects reviewed. She will continue healthy lifestyle habits. Memory compensation strategies reviewed. She will follow up closely with PCP. She will return to see me in 6 months, sooner if needed. She and her husband verbalize understanding and agreement with this plan.   No orders of the defined types were placed in this encounter.    No orders of the defined types were placed in this encounter.   Shawnie Dapper, MSN, FNP-C 02/21/2021, 3:09 PM  [facility] [address] [place], [place] [address] [phone]

## 2021-02-22 ENCOUNTER — Ambulatory Visit: Payer: Medicare Other | Admitting: Family Medicine

## 2021-02-22 DIAGNOSIS — F0150 Vascular dementia without behavioral disturbance: Secondary | ICD-10-CM

## 2021-02-22 NOTE — Progress Notes (Addendum)
Chief Complaint  Patient presents with   Follow-up    New rm, w husband Jenny [name]. Here for 6 month f/u, pt reports doing well. R hand has worsen, has knot on her palm.      HISTORY OF PRESENT ILLNESS: 02/26/21 ALL:  Jenny [name] returns for follow up for [facility]. She has continued Aricept 10mg  daily. She feels that she is doing great. Jenny [name] agrees that her memory seems much sharper. She is able to perform ADLs independently. Jenny [name] still doses medications daily. She does not drive. She is able to pay her bills online. She plays card games on her computer. She continues to have difficulty with irritability. She doesn't understand why her husband asks her questions. She gets frustrated when he talks to her. They have been married for 41 years. She went shopping with her granddaughter last week and didn't have to use her Rolator. She denies falls.   08/24/2020 ALL:  Jenny [name] is a 75 y.o. female here today for follow up for mild neurocognitive impairment due to multiple etiologies including cerebrovascular disease and medication side effects. She was started on donepezil 5mg  at last visit with Dr Frances Furbish in 05/2020. Lab work showing positive RF, elevated IgA and AST. Referrals were placed to rheumatology and hematology. Patient has decided not to pursue additional workup. She does feel that Aricept has helped and has tolerated it well.  She feels "smarter". Her husband agrees that she seems to be a little sharper. She is doing more crossword puzzles. She is reading more. She is wanting to join the gym. She does not drive. She is independent with ADL's. She is sleeping from about 10-8. She has a good appetite. She loves to shop with her son. Anxiety continues to be her biggest concern. She is followed closely by PCP. She continues alprazolam 0.5mg  BID and sertraline 100mg  BID.    HISTORY (copied from Dr Teofilo Pod previous note)  Jenny [name] is a 75 year old right-handed woman with an underlying complex  medical history of hypothyroidism, diabetes, anxiety, depression, history of panic attacks, osteoporosis, hypothyroidism, vitamin D deficiency, stroke, TIA, aortic stenosis, status post bovine aortic valve replacement in 2018, irritable bowel syndrome, hyperlipidemia, chronic systolic CHF, coronary artery disease, and arthritis, who presents for follow-up consultation of her memory loss and numbness in her feet. The patient is accompanied by her husband again today. She rescheduled an appointment from 05/08/20. I last saw her on 01/04/2020, at which time she felt about the same but her husband had noticed more mood irritability and also more forgetfulness.  She was still struggling with her depression.  She reported numbness and tingling in both feet, no significant pain.  She did have elevated blood sugar values in the evenings.  She reported that her last A1c was less than 7.  We talked about her work-up thus far, EEG was normal in March 2021, neuropsychological evaluation in April 2021 with Dr. Clayborn Heron showed mild neurocognitive disorder that may be multifactorial.  He suspected that she may have minor chronic mild cognitive impairment but no findings particularly concerning for Alzheimer's dementia at the time.     She was advised to discuss mood related issues including depression with her psychiatrist, she had an upcoming appointment, per husband.   She was advised to proceed with an EMG and nerve conduction velocity test through our office.     She had an EMG/nerve conduction velocity test on 02/24/2020 and I reviewed the results: IMPRESSION:  This study demonstrates mild axonal sensorimotor polyneuropathy.   We called with the test results.   Today, 05/23/2020: She reports feeling stable, no new issues with her memory as far she is concerned. Her husband feels that she is fairly stable. Mood wise, she also feels stable. Her husband, she did see a psychiatrist a couple of times but he canceled  the follow-up appointment. She has not had any new medications.  She continues to take Xanax for anxiety. She reports that she sees her primary care physician for this on a regular basis.   The patient's allergies, current medications, family history, past medical history, past social history, past surgical history and problem list were reviewed and updated as appropriate.      REVIEW OF SYSTEMS: Out of a complete 14 system review of symptoms, the patient complains only of the following symptoms, memory loss, anxiety and all other reviewed systems are negative.    ALLERGIES: Allergies  Allergen Reactions   Iodine Swelling    ANGIOEDEMA FACIAL SWELLING "BETADINE OKAY"   Shellfish Allergy Anaphylaxis     HOME MEDICATIONS: Outpatient Medications Prior to Visit  Medication Sig Dispense Refill   alendronate (FOSAMAX) 70 MG tablet Take 70 mg by mouth once a week. Take with a full glass of water on an empty stomach.     ALPRAZolam (XANAX) 0.5 MG tablet Take 1 tablet (0.5 mg total) by mouth 2 (two) times daily. 60 tablet 0   Cholecalciferol (VITAMIN D3 PO) Take by mouth daily.     hydrOXYzine (ATARAX/VISTARIL) 25 MG tablet Take 25 mg by mouth 2 (two) times daily.      insulin glargine (LANTUS) 100 UNIT/ML injection Inject 25-35 Units into the skin See admin instructions. 35 units every morning and 25 units at bedtime     Insulin Syringe-Needle U-100 (INSULIN SYRINGE .5CC/31GX5/16") 31G X 5/16" 0.5 ML MISC USE 1 NEEDLE TWICE DAILY 100 each 6   JANUVIA 100 MG tablet TAKE 1 TABLET BY MOUTH EVERY MORNING 90 tablet 3   levothyroxine (SYNTHROID) 88 MCG tablet TAKE 1 TABLET BY MOUTH DAILY 90 tablet 3   Loperamide HCl (IMODIUM A-D PO) Take by mouth as needed.     Melatonin 10 MG TABS Take by mouth at bedtime.     metFORMIN (GLUCOPHAGE) 500 MG tablet TAKE 1 TABLET BY MOUTH TWICE DAILY 180 tablet 3   methylPREDNISolone (MEDROL DOSEPAK) 4 MG TBPK tablet Use as directed 21 tablet 0   ONETOUCH ULTRA  test strip TEST THREE TIMES DAILY 100 strip 3   Phenylephrine-DM-GG (MUCINEX FAST-MAX CONGEST COUGH PO) Take by mouth as needed.     rosuvastatin (CRESTOR) 10 MG tablet TAKE 1 TABLET BY MOUTH DAILY 90 tablet 3   sertraline (ZOLOFT) 100 MG tablet TAKE 1 TABLET BY MOUTH TWICE DAILY 180 tablet 2   donepezil (ARICEPT) 10 MG tablet Take 1 tablet (10 mg total) by mouth at bedtime. Take 1/2 pill each evening. 90 tablet 1   Facility-Administered Medications Prior to Visit  Medication Dose Route Frequency Provider Last Rate Last Admin   lidocaine (PF) (XYLOCAINE) 1 % injection 2 mL  2 mL Intradermal Once Corky Downs, MD       triamcinolone acetonide (KENALOG-40) injection 40 mg  40 mg Intramuscular Once Corky Downs, MD         PAST MEDICAL HISTORY: Past Medical History:  Diagnosis Date   Anxiety    Arthritis    CAD (coronary artery disease)    a. 01/2017 Cath:  nonobs dzs.   Chronic systolic CHF (congestive heart failure) (HCC)    a. TTE 12/2016, EF 45-50%, probable hypokinesis of the mid apical anteroseptal, anterior, & apical myocardium, GR2DD, possibly bicuspid aortic valve that was moderately thickened w/ severely calcified leaflets, severe aortic stenosis w/ mean gradient 47 mmHg, valve area 0.52 cm, mild MR, mildly dilated LA   Depression    Diabetes mellitus with complication (HCC)    Tylenol   Hyperlipidemia    Hypothyroidism    IBS (irritable bowel syndrome)    Iron deficiency anemia    a. s/p pRBC x1 in 12/2016   Microcytic anemia    Osteoporosis    Panic attacks    Severe aortic stenosis    a. TTE 12/2016: possibly bicuspid aortic valve that was moderately thickened with severely calcified leaflets. There was severe aortic stenosis with a mean gradient of 47 mmHg and valve area of 0.52 cm; b. 01/2017 s/p AVR w/ 19 mm bioprosthetic Magna Ease pericardial tissue valve, ser# [id number].   Stroke [facility]) 2001   TIA (transient ischemic attack) 2006     PAST SURGICAL HISTORY: Past  Surgical History:  Procedure Laterality Date   ABDOMINAL HYSTERECTOMY     AORTIC VALVE REPLACEMENT N/A 02/17/2017   Procedure: AORTIC VALVE REPLACEMENT (AVR);  Surgeon: Donata Clay, Theron Arista, MD;  Location: [facility] OR;  Service: Open Heart Surgery;  Laterality: N/A;   CATARACT EXTRACTION W/PHACO Right 04/09/2016   Procedure: CATARACT EXTRACTION PHACO AND INTRAOCULAR LENS PLACEMENT (IOC);  Surgeon: Galen Manila, MD;  Location: [facility] ORS;  Service: Ophthalmology;  Laterality: Right;  [place] 57.2 AP% 18.6 CDE 10.64 Fluid Pack lot # [id number]   CATARACT EXTRACTION W/PHACO Left 12/29/2018   Procedure: CATARACT EXTRACTION PHACO AND INTRAOCULAR LENS PLACEMENT (IOC)  LEFT DIABETIC;  Surgeon: Galen Manila, MD;  Location: [facility];  Service: Ophthalmology;  Laterality: Left;  Diabetic - insuli and oral meds   CHOLECYSTECTOMY     COLONOSCOPY     CORONARY ANGIOGRAPHY N/A 02/03/2017   Procedure: CORONARY ANGIOGRAPHY;  Surgeon: Iran Ouch, MD;  Location: [facility] INVASIVE CV LAB;  Service: Cardiovascular;  Laterality: N/A;   FRACTURE SURGERY     LEFT ANKLE   RIGHT AND LEFT HEART CATH Bilateral 02/03/2017   Procedure: Right and Left Heart Cath;  Surgeon: Iran Ouch, MD;  Location: [facility] INVASIVE CV LAB;  Service: Cardiovascular;  Laterality: Bilateral;   TEE WITHOUT CARDIOVERSION N/A 02/17/2017   Procedure: TRANSESOPHAGEAL ECHOCARDIOGRAM (TEE);  Surgeon: Donata Clay, Theron Arista, MD;  Location: [facility] OR;  Service: Open Heart Surgery;  Laterality: N/A;   TIBIA IM NAIL INSERTION Left 05/19/2018   Procedure: INTRAMEDULLARY (IM) NAIL TIBIAL;  Surgeon: Juanell Fairly, MD;  Location: [facility] ORS;  Service: Orthopedics;  Laterality: Left;     FAMILY HISTORY: Family History  Problem Relation Age of Onset   Hypertension Mother      SOCIAL HISTORY: Social History   Socioeconomic History   Marital status: Married    Spouse name: Not on file   Number of children: Not on file   Years of education: Not on file    Highest education level: Not on file  Occupational History   Not on file  Tobacco Use   Smoking status: Former    Years: 30.00    Types: Cigarettes    Quit date: 02/04/2000    Years since quitting: 21.0   Smokeless tobacco: Never  Vaping Use   Vaping Use: Never used  Substance and Sexual Activity  Alcohol use: No   Drug use: No   Sexual activity: Not Currently  Other Topics Concern   Not on file  Social History Narrative   Not on file   Social Determinants of Health   Financial Resource Strain: Not on file  Food Insecurity: Not on file  Transportation Needs: Not on file  Physical Activity: Not on file  Stress: Not on file  Social Connections: Not on file  Intimate Partner Violence: Not on file      PHYSICAL EXAM  Vitals:   02/26/21 1509  BP: 131/71  Pulse: 79  Weight: 121 lb 6.4 oz (55.1 kg)  Height: 4\' 10"  (1.473 m)    Body mass index is 25.37 kg/m.   Generalized: Well developed, in no acute distress  Cardiology: normal rate and rhythm, no murmur auscultated  Respiratory: clear to auscultation bilaterally    Neurological examination  Mentation: Alert oriented to time, place, history taking. Follows all commands speech and language fluent Cranial nerve II-XII: Pupils were equal round reactive to light. Extraocular movements were full, visual field were full on confrontational test. Facial sensation and strength were normal. Head turning and shoulder shrug  were normal and symmetric. Motor: The motor testing reveals 5 over 5 strength of all 4 extremities. Good symmetric motor tone is noted throughout.  Sensory: Sensory testing is intact to soft touch on all 4 extremities. No evidence of extinction is noted.  Coordination: Cerebellar testing reveals good finger-nose-finger (very mild right ataxia) and heel-to-shin bilaterally.  Gait and station: Gait is stable with Rolator, Tandem not attempted     DIAGNOSTIC DATA (LABS, IMAGING, TESTING) - I reviewed  patient records, labs, notes, testing and imaging myself where available.  Lab Results  Component Value Date   WBC 4.1 01/17/2020   HGB 12.9 01/17/2020   HCT 39.1 01/17/2020   MCV 87.9 01/17/2020   PLT 87 (L) 01/17/2020      Component Value Date/Time   NA 138 05/23/2020 1514   NA 134 (L) 10/24/2014 0613   K 4.8 05/23/2020 1514   K 4.4 10/24/2014 0613   CL 102 05/23/2020 1514   CL 105 10/24/2014 0613   CO2 24 05/23/2020 1514   CO2 23 10/24/2014 0613   GLUCOSE 67 05/23/2020 1514   GLUCOSE 164 (H) 06/06/2019 1312   GLUCOSE 293 (H) 10/24/2014 0613   BUN 10 05/23/2020 1514   BUN 11 10/24/2014 0613   CREATININE 0.83 05/23/2020 1514   CREATININE 0.60 10/24/2014 0613   CALCIUM 9.5 05/23/2020 1514   CALCIUM 8.6 (L) 10/24/2014 0613   PROT 7.2 05/23/2020 1514   PROT 7.7 03/02/2012 0634   ALBUMIN 3.9 05/23/2020 1514   ALBUMIN 3.2 (L) 03/02/2012 0634   AST 43 (H) 05/23/2020 1514   AST 26 03/02/2012 0634   ALT 22 05/23/2020 1514   ALT 21 03/02/2012 0634   ALKPHOS 95 05/23/2020 1514   ALKPHOS 103 03/02/2012 0634   BILITOT 0.4 05/23/2020 1514   BILITOT 0.3 03/02/2012 0634   GFRNONAA 71 05/23/2020 1514   GFRNONAA >60 10/24/2014 0613   GFRAA 82 05/23/2020 1514   GFRAA >60 10/24/2014 0613   Lab Results  Component Value Date   CHOL 92 01/15/2017   HDL 36 (L) 01/15/2017   LDLCALC 37 01/15/2017   TRIG 97 01/15/2017   CHOLHDL 2.6 01/15/2017   Lab Results  Component Value Date   HGBA1C 5.6 02/19/2021   Lab Results  Component Value Date   VITAMINB12 933 05/23/2020  Lab Results  Component Value Date   TSH 2.450 05/23/2020    MMSE - Mini Mental State Exam 02/26/2021 08/24/2020 05/23/2020  Orientation to time Orientation to Place Registration Attention/ Calculation Recall Language- name 2 objects Language- repeat Language- follow 3 step command Language- read & follow direction Write a sentence Copy  design Total score Montreal Cognitive Assessment  10/06/2019  Visuospatial/ Executive (0/5) 3  Naming (0/3) 3  Attention: Read list of digits (0/2) 2  Attention: Read list of letters (0/1) 1  Attention: Serial 7 subtraction starting at 100 (0/3) 1  Language: Repeat phrase (0/2) 1  Language : Fluency (0/1) 0  Abstraction (0/2) 2  Delayed Recall (0/5) 5  Orientation (0/6) 6  Total 24  Adjusted Score (based on education) 25     ASSESSMENT AND PLAN  75 y.o. year old female  has a past medical history of Anxiety, Arthritis, CAD (coronary artery disease), Chronic systolic CHF (congestive heart failure) (HCC), Depression, Diabetes mellitus with complication (HCC), Hyperlipidemia, Hypothyroidism, IBS (irritable bowel syndrome), Iron deficiency anemia, Microcytic anemia, Osteoporosis, Panic attacks, Severe aortic stenosis, Stroke (HCC) (2001), and TIA (transient ischemic attack) (2006). here with   Mild vascular neurocognitive disorder [facility])  Jenny [name] feels that Aricept has been very helpful. MMSE 30/30, today. I do feel that there is some cognitive deficits that are most likely multifactorial. She is very talkative and easily distracted. She will continue Aricept  daily. I have encouraged her to continue working closely with her PCP. Unclear if she is still seeing psychiatry but I do feel it would be helpful. She will continue healthy lifestyle habits. Memory compensation strategies reviewed. She will return to see me in 1 year, sooner if needed. She and her husband verbalize understanding and agreement with this plan.   No orders of the defined types were placed in this encounter.    Meds ordered this encounter  Medications   donepezil (ARICEPT) 10 MG tablet    Sig: Take 1 tablet (10 mg total) by mouth at bedtime.    Dispense:  90 tablet    Refill:  3    Order Specific Question:   Supervising Provider    Answer:   Anson Fret [[id number]]     Shawnie Dapper,  MSN, FNP-C 02/26/2021, 4:03 PM  [facility] [address] [place], [place] [address] [phone]  I reviewed the above note and documentation by the Nurse Practitioner and agree with the history, exam, assessment and plan as outlined above. I was available for consultation. Huston Foley, MD, PhD [facility])

## 2021-02-26 ENCOUNTER — Encounter: Payer: Self-pay | Admitting: Family Medicine

## 2021-02-26 ENCOUNTER — Ambulatory Visit (INDEPENDENT_AMBULATORY_CARE_PROVIDER_SITE_OTHER): Payer: Medicare Other | Admitting: Family Medicine

## 2021-02-26 VITALS — BP 131/71 | HR 79 | Ht <= 58 in | Wt 121.4 lb

## 2021-02-26 DIAGNOSIS — F0150 Vascular dementia without behavioral disturbance: Secondary | ICD-10-CM | POA: Diagnosis not present

## 2021-02-26 DIAGNOSIS — F01A0 Vascular dementia, mild, without behavioral disturbance, psychotic disturbance, mood disturbance, and anxiety: Secondary | ICD-10-CM

## 2021-02-26 MED ORDER — DONEPEZIL HCL 10 MG PO TABS: 10.0000 mg | ORAL_TABLET | Freq: Every day | ORAL | 3 refills | Status: DC

## 2021-02-26 NOTE — Patient Instructions (Signed)
Below is our plan:  We will continue Aricpet 10mg  daily.   Please make sure you are staying well hydrated. I recommend 50-60 ounces daily. Well balanced diet and regular exercise encouraged. Consistent sleep schedule with 6-8 hours recommended.   Please continue follow up with care team as directed.   Follow up with me in 1 year   You may receive a survey regarding today's visit. I encourage you to leave honest feed back as I do use this information to improve patient care. Thank you for seeing me today!    Management of Memory Problems   There are some general things you can do to help manage your memory problems.  Your memory may not in fact recover, but by using techniques and strategies you will be able to manage your memory difficulties better.   1)  Establish a routine. Try to establish and then stick to a regular routine.  By doing this, you will get used to what to expect and you will reduce the need to rely on your memory.  Also, try to do things at the same time of day, such as taking your medication or checking your calendar first thing in the morning. Think about think that you can do as a part of a regular routine and make a list.  Then enter them into a daily planner to remind you.  This will help you establish a routine.   2)  Organize your environment. Organize your environment so that it is uncluttered.  Decrease visual stimulation.  Place everyday items such as keys or cell phone in the same place every day (ie.  Basket next to front door) Use post it notes with a brief message to yourself (ie. Turn off light, lock the door) Use labels to indicate where things go (ie. Which cupboards are for food, dishes, etc.) Keep a notepad and pen by the telephone to take messages   3)  Memory Aids A diary or journal/notebook/daily planner Making a list (shopping list, chore list, to do list that needs to be done) Using an alarm as a reminder (kitchen timer or cell phone  alarm) Using cell phone to store information (Notes, Calendar, Reminders) Calendar/White board placed in a prominent position Post-it notes   In order for memory aids to be useful, you need to have good habits.  It's no good remembering to make a note in your journal if you don't remember to look in it.  Try setting aside a certain time of day to look in journal.   4)  Improving mood and managing fatigue. There may be other factors that contribute to memory difficulties.  Factors, such as anxiety, depression and tiredness can affect memory. Regular gentle exercise can help improve your mood and give you more energy. Simple relaxation techniques may help relieve symptoms of anxiety Try to get back to completing activities or hobbies you enjoyed doing in the past. Learn to pace yourself through activities to decrease fatigue. Find out about some local support groups where you can share experiences with others. Try and achieve 7-8 hours of sleep at night.

## 2021-03-13 ENCOUNTER — Other Ambulatory Visit: Payer: Self-pay | Admitting: Internal Medicine

## 2021-03-21 ENCOUNTER — Ambulatory Visit: Payer: Medicare Other | Admitting: Internal Medicine

## 2021-03-27 ENCOUNTER — Ambulatory Visit: Payer: Medicare Other | Admitting: Internal Medicine

## 2021-04-04 ENCOUNTER — Other Ambulatory Visit: Payer: Self-pay

## 2021-04-04 ENCOUNTER — Encounter: Payer: Self-pay | Admitting: Internal Medicine

## 2021-04-04 ENCOUNTER — Ambulatory Visit (INDEPENDENT_AMBULATORY_CARE_PROVIDER_SITE_OTHER): Payer: Medicare Other | Admitting: Internal Medicine

## 2021-04-04 DIAGNOSIS — E119 Type 2 diabetes mellitus without complications: Secondary | ICD-10-CM

## 2021-04-04 DIAGNOSIS — E038 Other specified hypothyroidism: Secondary | ICD-10-CM

## 2021-04-04 DIAGNOSIS — F419 Anxiety disorder, unspecified: Secondary | ICD-10-CM | POA: Diagnosis not present

## 2021-04-04 DIAGNOSIS — I208 Other forms of angina pectoris: Secondary | ICD-10-CM

## 2021-04-04 DIAGNOSIS — E118 Type 2 diabetes mellitus with unspecified complications: Secondary | ICD-10-CM

## 2021-04-04 DIAGNOSIS — Z952 Presence of prosthetic heart valve: Secondary | ICD-10-CM | POA: Diagnosis not present

## 2021-04-04 DIAGNOSIS — M8080XD Other osteoporosis with current pathological fracture, unspecified site, subsequent encounter for fracture with routine healing: Secondary | ICD-10-CM

## 2021-04-04 DIAGNOSIS — Z23 Encounter for immunization: Secondary | ICD-10-CM

## 2021-04-04 DIAGNOSIS — I10 Essential (primary) hypertension: Secondary | ICD-10-CM | POA: Diagnosis not present

## 2021-04-04 DIAGNOSIS — E063 Autoimmune thyroiditis: Secondary | ICD-10-CM

## 2021-04-04 DIAGNOSIS — R413 Other amnesia: Secondary | ICD-10-CM

## 2021-04-04 LAB — GLUCOSE, POCT (MANUAL RESULT ENTRY): POC Glucose: 142 mg/dl — AB (ref 70–99)

## 2021-04-04 MED ORDER — ALPRAZOLAM 0.5 MG PO TABS: 0.5000 mg | ORAL_TABLET | Freq: Two times a day (BID) | ORAL | 0 refills | Status: DC

## 2021-04-04 NOTE — Assessment & Plan Note (Signed)
Aortic valve is doing well normal noted on physical examination, chest is clear of auscultation

## 2021-04-04 NOTE — Assessment & Plan Note (Signed)
?-   Encouraged patient to engage in relaxing activities like yoga, meditation, journaling, going for a walk, or participating in a hobby.  ?- Encouraged patient to reach out to trusted friends or family members about recent struggles, ?Patient was advised to read A book, how to stop worrying and start living, it is good book to read to control  the stress ? ?

## 2021-04-04 NOTE — Assessment & Plan Note (Signed)

## 2021-04-04 NOTE — Progress Notes (Signed)
Established Patient Office Visit  Subjective:  Patient ID: Jenny [name], female    DOB: [birth year]  Age: 75 y.o. MRN: [id number]  CC:  Chief Complaint  Patient presents with   Diabetes    Diabetes   Jenny [name] presents for general checkup patient has a problem with anxiety she is status post aortic valve replacement.  She does. We will monitor temperature  Past Medical History:  Diagnosis Date   Anxiety    Arthritis    CAD (coronary artery disease)    a. 01/2017 Cath: nonobs dzs.   Chronic systolic CHF (congestive heart failure) (HCC)    a. TTE 12/2016, EF 45-50%, probable hypokinesis of the mid apical anteroseptal, anterior, & apical myocardium, GR2DD, possibly bicuspid aortic valve that was moderately thickened w/ severely calcified leaflets, severe aortic stenosis w/ mean gradient 47 mmHg, valve area 0.52 cm, mild MR, mildly dilated LA   Depression    Diabetes mellitus with complication (HCC)    Tylenol   Hyperlipidemia    Hypothyroidism    IBS (irritable bowel syndrome)    Iron deficiency anemia    a. s/p pRBC x1 in 12/2016   Microcytic anemia    Osteoporosis    Panic attacks    Severe aortic stenosis    a. TTE 12/2016: possibly bicuspid aortic valve that was moderately thickened with severely calcified leaflets. There was severe aortic stenosis with a mean gradient of 47 mmHg and valve area of 0.52 cm; b. 01/2017 s/p AVR w/ 19 mm bioprosthetic Magna Ease pericardial tissue valve, ser# [id number].   Stroke [facility]) 2001   TIA (transient ischemic attack) 2006    Past Surgical History:  Procedure Laterality Date   ABDOMINAL HYSTERECTOMY     AORTIC VALVE REPLACEMENT N/A 02/17/2017   Procedure: AORTIC VALVE REPLACEMENT (AVR);  Surgeon: Donata Clay, Theron Arista, MD;  Location: [facility] OR;  Service: Open Heart Surgery;  Laterality: N/A;   CATARACT EXTRACTION W/PHACO Right 04/09/2016   Procedure: CATARACT EXTRACTION PHACO AND INTRAOCULAR LENS PLACEMENT (IOC);  Surgeon: Galen Manila, MD;  Location: [facility] ORS;  Service: Ophthalmology;  Laterality: Right;  [place] 57.2 AP% 18.6 CDE 10.64 Fluid Pack lot # [id number]   CATARACT EXTRACTION W/PHACO Left 12/29/2018   Procedure: CATARACT EXTRACTION PHACO AND INTRAOCULAR LENS PLACEMENT (IOC)  LEFT DIABETIC;  Surgeon: Galen Manila, MD;  Location: [facility];  Service: Ophthalmology;  Laterality: Left;  Diabetic - insuli and oral meds   CHOLECYSTECTOMY     COLONOSCOPY     CORONARY ANGIOGRAPHY N/A 02/03/2017   Procedure: CORONARY ANGIOGRAPHY;  Surgeon: Iran Ouch, MD;  Location: [facility] INVASIVE CV LAB;  Service: Cardiovascular;  Laterality: N/A;   FRACTURE SURGERY     LEFT ANKLE   RIGHT AND LEFT HEART CATH Bilateral 02/03/2017   Procedure: Right and Left Heart Cath;  Surgeon: Iran Ouch, MD;  Location: [facility] INVASIVE CV LAB;  Service: Cardiovascular;  Laterality: Bilateral;   TEE WITHOUT CARDIOVERSION N/A 02/17/2017   Procedure: TRANSESOPHAGEAL ECHOCARDIOGRAM (TEE);  Surgeon: Donata Clay, Theron Arista, MD;  Location: [facility] OR;  Service: Open Heart Surgery;  Laterality: N/A;   TIBIA IM NAIL INSERTION Left 05/19/2018   Procedure: INTRAMEDULLARY (IM) NAIL TIBIAL;  Surgeon: Juanell Fairly, MD;  Location: [facility] ORS;  Service: Orthopedics;  Laterality: Left;    Family History  Problem Relation Age of Onset   Hypertension Mother     Social History   Socioeconomic History   Marital status: Married    Spouse  name: Not on file   Number of children: Not on file   Years of education: Not on file   Highest education level: Not on file  Occupational History   Not on file  Tobacco Use   Smoking status: Former    Years: 30.00    Types: Cigarettes    Quit date: 02/04/2000    Years since quitting: 21.1   Smokeless tobacco: Never  Vaping Use   Vaping Use: Never used  Substance and Sexual Activity   Alcohol use: No   Drug use: No   Sexual activity: Not Currently  Other Topics Concern   Not on file  Social History  Narrative   Not on file   Social Determinants of Health   Financial Resource Strain: Not on file  Food Insecurity: Not on file  Transportation Needs: Not on file  Physical Activity: Not on file  Stress: Not on file  Social Connections: Not on file  Intimate Partner Violence: Not on file     Current Outpatient Medications:    alendronate (FOSAMAX) 70 MG tablet, Take 70 mg by mouth once a week. Take with a full glass of water on an empty stomach., Disp: , Rfl:    Cholecalciferol (VITAMIN D3 PO), Take by mouth daily., Disp: , Rfl:    donepezil (ARICEPT) 10 MG tablet, Take 1 tablet (10 mg total) by mouth at bedtime., Disp: 90 tablet, Rfl: 3   hydrOXYzine (ATARAX/VISTARIL) 25 MG tablet, Take 25 mg by mouth 2 (two) times daily. , Disp: , Rfl:    insulin glargine (LANTUS) 100 UNIT/ML injection, Inject 25-35 Units into the skin See admin instructions. 35 units every morning and 25 units at bedtime, Disp: , Rfl:    Insulin Syringe-Needle U-100 (INSULIN SYRINGE .5CC/31GX5/16") 31G X 5/16" 0.5 ML MISC, USE 1 NEEDLE TWICE DAILY, Disp: 100 each, Rfl: 6   JANUVIA 100 MG tablet, TAKE 1 TABLET BY MOUTH EVERY MORNING, Disp: 90 tablet, Rfl: 3   Lancets (ONETOUCH DELICA PLUS LANCET30G) MISC, USE TO TEST BLOOD SUGAR TWICE DAILY, Disp: 100 each, Rfl: 6   levothyroxine (SYNTHROID) 88 MCG tablet, TAKE 1 TABLET BY MOUTH DAILY, Disp: 90 tablet, Rfl: 3   Loperamide HCl (IMODIUM A-D PO), Take by mouth as needed., Disp: , Rfl:    Melatonin 10 MG TABS, Take by mouth at bedtime., Disp: , Rfl:    metFORMIN (GLUCOPHAGE) 500 MG tablet, TAKE 1 TABLET BY MOUTH TWICE DAILY, Disp: 180 tablet, Rfl: 3   methylPREDNISolone (MEDROL DOSEPAK) 4 MG TBPK tablet, Use as directed, Disp: 21 tablet, Rfl: 0   ONETOUCH ULTRA test strip, TEST THREE TIMES DAILY, Disp: 100 strip, Rfl: 3   Phenylephrine-DM-GG (MUCINEX FAST-MAX CONGEST COUGH PO), Take by mouth as needed., Disp: , Rfl:    rosuvastatin (CRESTOR) 10 MG tablet, TAKE 1 TABLET BY  MOUTH DAILY, Disp: 90 tablet, Rfl: 3   sertraline (ZOLOFT) 100 MG tablet, TAKE 1 TABLET BY MOUTH TWICE DAILY, Disp: 180 tablet, Rfl: 2   ALPRAZolam (XANAX) 0.5 MG tablet, Take 1 tablet (0.5 mg total) by mouth 2 (two) times daily., Disp: 60 tablet, Rfl: 0  Current Facility-Administered Medications:    lidocaine (PF) (XYLOCAINE) 1 % injection 2 mL, 2 mL, Intradermal, Once, Ishanvi Mcquitty, Renda Rolls, MD   triamcinolone acetonide (KENALOG-40) injection 40 mg, 40 mg, Intramuscular, Once, Corky Downs, MD   Allergies  Allergen Reactions   Iodine Swelling    ANGIOEDEMA FACIAL SWELLING "BETADINE OKAY"   Shellfish Allergy Anaphylaxis  ROS Review of Systems  Constitutional: Negative.   HENT: Negative.    Eyes: Negative.   Respiratory: Negative.    Cardiovascular: Negative.   Gastrointestinal: Negative.   Endocrine: Negative.   Genitourinary: Negative.   Musculoskeletal: Negative.   Skin: Negative.   Allergic/Immunologic: Negative.   Neurological: Negative.   Hematological: Negative.   Psychiatric/Behavioral: Negative.    All other systems reviewed and are negative.    Objective:    Physical Exam Vitals reviewed.  Constitutional:      Appearance: Normal appearance.  HENT:     Mouth/Throat:     Mouth: Mucous membranes are moist.  Eyes:     Pupils: Pupils are equal, round, and reactive to light.  Neck:     Vascular: No carotid bruit.  Cardiovascular:     Rate and Rhythm: Normal rate and regular rhythm.     Pulses: Normal pulses.     Heart sounds: Normal heart sounds.  Pulmonary:     Effort: Pulmonary effort is normal.     Breath sounds: Normal breath sounds.  Abdominal:     General: Bowel sounds are normal.     Palpations: Abdomen is soft. There is no hepatomegaly, splenomegaly or mass.     Tenderness: There is no abdominal tenderness.     Hernia: No hernia is present.  Musculoskeletal:        General: No tenderness.     Cervical back: Neck supple.     Right lower leg: No  edema.     Left lower leg: No edema.  Skin:    Findings: No rash.  Neurological:     Mental Status: She is alert and oriented to person, place, and time.     Motor: No weakness.  Psychiatric:        Mood and Affect: Mood and affect normal.        Behavior: Behavior normal.    There were no vitals taken for this visit. Wt Readings from Last 3 Encounters:  02/26/21 121 lb 6.4 oz (55.1 kg)  01/16/21 119 lb 1.6 oz (54 kg)  12/19/20 119 lb 9.6 oz (54.3 kg)     Health Maintenance Due  Topic Date Due   FOOT EXAM  Never done   OPHTHALMOLOGY EXAM  Never done   URINE MICROALBUMIN  Never done   Hepatitis C Screening  Never done   TETANUS/TDAP  Never done   COLONOSCOPY (Pts 45-72yrs Insurance coverage will need to be confirmed)  Never done   MAMMOGRAM  Never done   Zoster Vaccines- Shingrix (2 of 2) 04/07/2013   DEXA SCAN  Never done   COVID-19 Vaccine (4 - Booster for Pfizer series) 09/21/2020   INFLUENZA VACCINE  01/29/2021    There are no preventive care reminders to display for this patient.  Lab Results  Component Value Date   TSH 2.450 05/23/2020   Lab Results  Component Value Date   WBC 4.1 01/17/2020   HGB 12.9 01/17/2020   HCT 39.1 01/17/2020   MCV 87.9 01/17/2020   PLT 87 (L) 01/17/2020   Lab Results  Component Value Date   NA 138 05/23/2020   K 4.8 05/23/2020   CO2 24 05/23/2020   GLUCOSE 67 05/23/2020   BUN 10 05/23/2020   CREATININE 0.83 05/23/2020   BILITOT 0.4 05/23/2020   ALKPHOS 95 05/23/2020   AST 43 (H) 05/23/2020   ALT 22 05/23/2020   PROT 7.2 05/23/2020   ALBUMIN 3.9 05/23/2020   CALCIUM 9.5  05/23/2020   ANIONGAP 8 06/06/2019   Lab Results  Component Value Date   CHOL 92 01/15/2017   Lab Results  Component Value Date   HDL 36 (L) 01/15/2017   Lab Results  Component Value Date   LDLCALC 37 01/15/2017   Lab Results  Component Value Date   TRIG 97 01/15/2017   Lab Results  Component Value Date   CHOLHDL 2.6 01/15/2017   Lab  Results  Component Value Date   HGBA1C 5.6 02/19/2021      Assessment & Plan:   Problem List Items Addressed This Visit       Cardiovascular and Mediastinum   Essential hypertension     Endocrine   Type 2 diabetes mellitus with complication, without long-term current use of insulin (HCC)    - The patient's blood sugar is labile on med. - The patient will continue the current treatment regimen.  - I encouraged the patient to regularly check blood sugar.  - I encouraged the patient to monitor diet. I encouraged the patient to eat low-carb and low-sugar to help prevent blood sugar spikes.  - I encouraged the patient to continue following their prescribed treatment plan for diabetes - I informed the patient to get help if blood sugar drops below 54mg /dL, or if suddenly have trouble thinking clearly or breathing.  Patient was advised to buy a book on diabetes from a local bookstore or from .  Patient should read 2 chapters every day to keep the motivation going, this is in addition to some of the materials we provided them from the office.  There are other resources on the Internet like YouTube and wilkipedia to get an education on the diabetes      Hypothyroidism    Stable at the present time        Musculoskeletal and Integument   Osteoporosis    Stable        Other   S/P AVR (aortic valve replacement)    Aortic valve is doing well normal noted on physical examination, chest is clear of auscultation      Anxiety     - Encouraged patient to engage in relaxing activities like yoga, meditation, journaling, going for a walk, or participating in a hobby.  - Encouraged patient to reach out to trusted friends or family members about recent struggles, Patient was advised to read A book, how to stop worrying and start living, it is good book to read to control  the stress       Relevant Medications   ALPRAZolam (XANAX) 0.5 MG tablet   Memory loss    Not getting any  worse      Other Visit Diagnoses     Type 2 diabetes mellitus without complication, without long-term current use of insulin (HCC)    -  Primary   Relevant Orders   POCT glucose (manual entry) (Completed)       Meds ordered this encounter  Medications   ALPRAZolam (XANAX) 0.5 MG tablet    Sig: Take 1 tablet (0.5 mg total) by mouth 2 (two) times daily.    Dispense:  60 tablet    Refill:  0    Follow-up: No follow-ups on file.    [place], MD

## 2021-04-04 NOTE — Assessment & Plan Note (Signed)
Stable

## 2021-04-04 NOTE — Addendum Note (Signed)
Addended by: Jobie Quaker on: 04/04/2021 05:07 PM   Modules accepted: Orders

## 2021-04-04 NOTE — Assessment & Plan Note (Signed)
Not getting any worse

## 2021-04-04 NOTE — Assessment & Plan Note (Signed)
Stable at the present time. 

## 2021-04-09 ENCOUNTER — Other Ambulatory Visit: Payer: Self-pay | Admitting: Internal Medicine

## 2021-04-27 ENCOUNTER — Other Ambulatory Visit: Payer: Self-pay | Admitting: *Deleted

## 2021-04-27 ENCOUNTER — Other Ambulatory Visit: Payer: Self-pay | Admitting: Internal Medicine

## 2021-04-27 MED ORDER — LEVOCETIRIZINE DIHYDROCHLORIDE 5 MG PO TABS: 5.0000 mg | ORAL_TABLET | Freq: Every evening | ORAL | 1 refills | Status: DC

## 2021-05-08 ENCOUNTER — Ambulatory Visit (INDEPENDENT_AMBULATORY_CARE_PROVIDER_SITE_OTHER): Payer: Medicare Other | Admitting: Internal Medicine

## 2021-05-08 ENCOUNTER — Encounter: Payer: Self-pay | Admitting: Internal Medicine

## 2021-05-08 ENCOUNTER — Other Ambulatory Visit: Payer: Self-pay

## 2021-05-08 DIAGNOSIS — E119 Type 2 diabetes mellitus without complications: Secondary | ICD-10-CM

## 2021-05-08 DIAGNOSIS — F419 Anxiety disorder, unspecified: Secondary | ICD-10-CM

## 2021-05-08 DIAGNOSIS — G6289 Other specified polyneuropathies: Secondary | ICD-10-CM | POA: Diagnosis not present

## 2021-05-08 DIAGNOSIS — I10 Essential (primary) hypertension: Secondary | ICD-10-CM

## 2021-05-08 DIAGNOSIS — E038 Other specified hypothyroidism: Secondary | ICD-10-CM | POA: Diagnosis not present

## 2021-05-08 DIAGNOSIS — Z23 Encounter for immunization: Secondary | ICD-10-CM | POA: Diagnosis not present

## 2021-05-08 DIAGNOSIS — E118 Type 2 diabetes mellitus with unspecified complications: Secondary | ICD-10-CM

## 2021-05-08 DIAGNOSIS — K589 Irritable bowel syndrome without diarrhea: Secondary | ICD-10-CM | POA: Diagnosis not present

## 2021-05-08 DIAGNOSIS — Z1211 Encounter for screening for malignant neoplasm of colon: Secondary | ICD-10-CM | POA: Diagnosis not present

## 2021-05-08 DIAGNOSIS — I208 Other forms of angina pectoris: Secondary | ICD-10-CM

## 2021-05-08 DIAGNOSIS — E063 Autoimmune thyroiditis: Secondary | ICD-10-CM | POA: Diagnosis not present

## 2021-05-08 MED ORDER — ZOSTER VAC RECOMB ADJUVANTED 50 MCG/0.5ML IM SUSR: 0.5000 mL | Freq: Once | INTRAMUSCULAR | 1 refills | Status: AC

## 2021-05-08 MED ORDER — ALPRAZOLAM 0.5 MG PO TABS: 0.5000 mg | ORAL_TABLET | Freq: Two times a day (BID) | ORAL | 0 refills | Status: DC

## 2021-05-08 NOTE — Assessment & Plan Note (Signed)
Stable at the present time. 

## 2021-05-08 NOTE — Assessment & Plan Note (Signed)
Under control 

## 2021-05-08 NOTE — Assessment & Plan Note (Signed)
Refer to neurologist 

## 2021-05-08 NOTE — Progress Notes (Signed)
Established Patient Office Visit  Subjective:  Patient ID: Jenny [name], female    DOB: 23-May-1946  Age: 75 y.o. MRN: [id number]  CC:  Chief Complaint  Patient presents with   Anxiety    Patient here for med refill for xanax     Anxiety     Jenny [name] presents for check up  Past Medical History:  Diagnosis Date   Anxiety    Arthritis    CAD (coronary artery disease)    a. 01/2017 Cath: nonobs dzs.   Chronic systolic CHF (congestive heart failure) (HCC)    a. TTE 12/2016, EF 45-50%, probable hypokinesis of the mid apical anteroseptal, anterior, & apical myocardium, GR2DD, possibly bicuspid aortic valve that was moderately thickened w/ severely calcified leaflets, severe aortic stenosis w/ mean gradient 47 mmHg, valve area 0.52 cm, mild MR, mildly dilated LA   Depression    Diabetes mellitus with complication (HCC)    Tylenol   Hyperlipidemia    Hypothyroidism    IBS (irritable bowel syndrome)    Iron deficiency anemia    a. s/p pRBC x1 in 12/2016   Microcytic anemia    Osteoporosis    Panic attacks    Severe aortic stenosis    a. TTE 12/2016: possibly bicuspid aortic valve that was moderately thickened with severely calcified leaflets. There was severe aortic stenosis with a mean gradient of 47 mmHg and valve area of 0.52 cm; b. 01/2017 s/p AVR w/ 19 mm bioprosthetic Magna Ease pericardial tissue valve, ser# [id number].   Stroke [facility]) 2001   TIA (transient ischemic attack) 2006    Past Surgical History:  Procedure Laterality Date   ABDOMINAL HYSTERECTOMY     AORTIC VALVE REPLACEMENT N/A 02/17/2017   Procedure: AORTIC VALVE REPLACEMENT (AVR);  Surgeon: Donata Clay, Theron Arista, MD;  Location: [facility] OR;  Service: Open Heart Surgery;  Laterality: N/A;   CATARACT EXTRACTION W/PHACO Right 04/09/2016   Procedure: CATARACT EXTRACTION PHACO AND INTRAOCULAR LENS PLACEMENT (IOC);  Surgeon: Galen Manila, MD;  Location: [facility] ORS;  Service: Ophthalmology;  Laterality: Right;  [place]  57.2 AP% 18.6 CDE 10.64 Fluid Pack lot # [id number]   CATARACT EXTRACTION W/PHACO Left 12/29/2018   Procedure: CATARACT EXTRACTION PHACO AND INTRAOCULAR LENS PLACEMENT (IOC)  LEFT DIABETIC;  Surgeon: Galen Manila, MD;  Location: [facility];  Service: Ophthalmology;  Laterality: Left;  Diabetic - insuli and oral meds   CHOLECYSTECTOMY     COLONOSCOPY     CORONARY ANGIOGRAPHY N/A 02/03/2017   Procedure: CORONARY ANGIOGRAPHY;  Surgeon: Iran Ouch, MD;  Location: [facility] INVASIVE CV LAB;  Service: Cardiovascular;  Laterality: N/A;   FRACTURE SURGERY     LEFT ANKLE   RIGHT AND LEFT HEART CATH Bilateral 02/03/2017   Procedure: Right and Left Heart Cath;  Surgeon: Iran Ouch, MD;  Location: [facility] INVASIVE CV LAB;  Service: Cardiovascular;  Laterality: Bilateral;   TEE WITHOUT CARDIOVERSION N/A 02/17/2017   Procedure: TRANSESOPHAGEAL ECHOCARDIOGRAM (TEE);  Surgeon: Donata Clay, Theron Arista, MD;  Location: [facility] OR;  Service: Open Heart Surgery;  Laterality: N/A;   TIBIA IM NAIL INSERTION Left 05/19/2018   Procedure: INTRAMEDULLARY (IM) NAIL TIBIAL;  Surgeon: Juanell Fairly, MD;  Location: [facility] ORS;  Service: Orthopedics;  Laterality: Left;    Family History  Problem Relation Age of Onset   Hypertension Mother     Social History   Socioeconomic History   Marital status: Married    Spouse name: Not on file   Number  of children: Not on file   Years of education: Not on file   Highest education level: Not on file  Occupational History   Not on file  Tobacco Use   Smoking status: Former    Years: 30.00    Types: Cigarettes    Quit date: 02/04/2000    Years since quitting: 21.2   Smokeless tobacco: Never  Vaping Use   Vaping Use: Never used  Substance and Sexual Activity   Alcohol use: No   Drug use: No   Sexual activity: Not Currently  Other Topics Concern   Not on file  Social History Narrative   Not on file   Social Determinants of Health   Financial Resource Strain:  Not on file  Food Insecurity: Not on file  Transportation Needs: Not on file  Physical Activity: Not on file  Stress: Not on file  Social Connections: Not on file  Intimate Partner Violence: Not on file     Current Outpatient Medications:    Zoster Vaccine Adjuvanted [facility]) injection, Inject 0.5 mLs into the muscle once for 1 dose. Please administer shingles vaccine and fax to provider at [phone], Disp: 0.5 mL, Rfl: 1   alendronate (FOSAMAX) 70 MG tablet, Take 70 mg by mouth once a week. Take with a full glass of water on an empty stomach., Disp: , Rfl:    ALPRAZolam (XANAX) 0.5 MG tablet, Take 1 tablet (0.5 mg total) by mouth 2 (two) times daily., Disp: 60 tablet, Rfl: 0   Cholecalciferol (VITAMIN D3 PO), Take by mouth daily., Disp: , Rfl:    donepezil (ARICEPT) 10 MG tablet, Take 1 tablet (10 mg total) by mouth at bedtime., Disp: 90 tablet, Rfl: 3   hydrOXYzine (ATARAX/VISTARIL) 25 MG tablet, Take 25 mg by mouth 2 (two) times daily. , Disp: , Rfl:    insulin glargine (LANTUS) 100 UNIT/ML injection, Inject 25-35 Units into the skin See admin instructions. 35 units every morning and 25 units at bedtime, Disp: , Rfl:    Insulin Syringe-Needle U-100 (INSULIN SYRINGE .5CC/31GX5/16") 31G X 5/16" 0.5 ML MISC, USE 1 NEEDLE TWICE DAILY, Disp: 100 each, Rfl: 6   JANUVIA 100 MG tablet, TAKE 1 TABLET BY MOUTH EVERY MORNING, Disp: 90 tablet, Rfl: 3   Lancets (ONETOUCH DELICA PLUS LANCET30G) MISC, USE TO TEST BLOOD SUGAR TWICE DAILY, Disp: 100 each, Rfl: 6   levocetirizine (XYZAL) 5 MG tablet, TAKE 1 TABLET(5 MG) BY MOUTH EVERY EVENING, Disp: 90 tablet, Rfl: 0   levothyroxine (SYNTHROID) 88 MCG tablet, TAKE 1 TABLET BY MOUTH DAILY, Disp: 90 tablet, Rfl: 3   Loperamide HCl (IMODIUM A-D PO), Take by mouth as needed., Disp: , Rfl:    Melatonin 10 MG TABS, Take by mouth at bedtime., Disp: , Rfl:    metFORMIN (GLUCOPHAGE) 500 MG tablet, TAKE 1 TABLET BY MOUTH TWICE DAILY, Disp: 180 tablet, Rfl: 3    methylPREDNISolone (MEDROL DOSEPAK) 4 MG TBPK tablet, Use as directed, Disp: 21 tablet, Rfl: 0   ONETOUCH ULTRA test strip, TEST THREE TIMES DAILY, Disp: 100 strip, Rfl: 3   Phenylephrine-DM-GG (MUCINEX FAST-MAX CONGEST COUGH PO), Take by mouth as needed., Disp: , Rfl:    rosuvastatin (CRESTOR) 10 MG tablet, TAKE 1 TABLET BY MOUTH DAILY, Disp: 90 tablet, Rfl: 3   sertraline (ZOLOFT) 100 MG tablet, TAKE 1 TABLET BY MOUTH TWICE DAILY, Disp: 180 tablet, Rfl: 2  Current Facility-Administered Medications:    lidocaine (PF) (XYLOCAINE) 1 % injection 2 mL, 2 mL, Intradermal,  Once, Corky Downs, MD   triamcinolone acetonide (KENALOG-40) injection 40 mg, 40 mg, Intramuscular, Once, Corky Downs, MD   Allergies  Allergen Reactions   Iodine Swelling    ANGIOEDEMA FACIAL SWELLING "BETADINE OKAY"   Shellfish Allergy Anaphylaxis    ROS Review of Systems  Constitutional: Negative.   HENT: Negative.    Eyes: Negative.   Respiratory: Negative.    Cardiovascular: Negative.   Gastrointestinal: Negative.   Endocrine: Negative.   Genitourinary: Negative.   Musculoskeletal: Negative.   Skin: Negative.   Allergic/Immunologic: Negative.   Neurological: Negative.   Hematological: Negative.   Psychiatric/Behavioral: Negative.    All other systems reviewed and are negative.    Objective:    Physical Exam Vitals reviewed.  Constitutional:      Appearance: Normal appearance.  HENT:     Mouth/Throat:     Mouth: Mucous membranes are moist.  Eyes:     Pupils: Pupils are equal, round, and reactive to light.  Neck:     Vascular: No carotid bruit.  Cardiovascular:     Rate and Rhythm: Normal rate and regular rhythm.     Pulses: Normal pulses.     Heart sounds: Normal heart sounds.  Pulmonary:     Effort: Pulmonary effort is normal.     Breath sounds: Normal breath sounds.  Abdominal:     General: Bowel sounds are normal.     Palpations: Abdomen is soft. There is no hepatomegaly,  splenomegaly or mass.     Tenderness: There is no abdominal tenderness.     Hernia: No hernia is present.  Musculoskeletal:        General: No tenderness.     Cervical back: Neck supple.     Right lower leg: No edema.     Left lower leg: No edema.  Skin:    Findings: No rash.  Neurological:     Mental Status: She is alert and oriented to person, place, and time.     Motor: No weakness.  Psychiatric:        Mood and Affect: Mood and affect normal.        Behavior: Behavior normal.    There were no vitals taken for this visit. Wt Readings from Last 3 Encounters:  02/26/21 121 lb 6.4 oz (55.1 kg)  01/16/21 119 lb 1.6 oz (54 kg)  12/19/20 119 lb 9.6 oz (54.3 kg)     Health Maintenance Due  Topic Date Due   FOOT EXAM  Never done   OPHTHALMOLOGY EXAM  Never done   URINE MICROALBUMIN  Never done   Hepatitis C Screening  Never done   COLONOSCOPY (Pts 45-63yrs Insurance coverage will need to be confirmed)  Never done   MAMMOGRAM  Never done   Zoster Vaccines- Shingrix (2 of 2) 04/07/2013   COVID-19 Vaccine (4 - Booster for Pfizer series) 07/19/2020    There are no preventive care reminders to display for this patient.  Lab Results  Component Value Date   TSH 2.450 05/23/2020   Lab Results  Component Value Date   WBC 4.1 01/17/2020   HGB 12.9 01/17/2020   HCT 39.1 01/17/2020   MCV 87.9 01/17/2020   PLT 87 (L) 01/17/2020   Lab Results  Component Value Date   NA 138 05/23/2020   K 4.8 05/23/2020   CO2 24 05/23/2020   GLUCOSE 67 05/23/2020   BUN 10 05/23/2020   CREATININE 0.83 05/23/2020   BILITOT 0.4 05/23/2020   ALKPHOS 95  05/23/2020   AST 43 (H) 05/23/2020   ALT 22 05/23/2020   PROT 7.2 05/23/2020   ALBUMIN 3.9 05/23/2020   CALCIUM 9.5 05/23/2020   ANIONGAP 8 06/06/2019   Lab Results  Component Value Date   CHOL 92 01/15/2017   Lab Results  Component Value Date   HDL 36 (L) 01/15/2017   Lab Results  Component Value Date   LDLCALC 37 01/15/2017    Lab Results  Component Value Date   TRIG 97 01/15/2017   Lab Results  Component Value Date   CHOLHDL 2.6 01/15/2017   Lab Results  Component Value Date   HGBA1C 5.6 02/19/2021      Assessment & Plan:   Problem List Items Addressed This Visit       Cardiovascular and Mediastinum   Essential hypertension     Patient denies any chest pain or shortness of breath there is no history of palpitation or paroxysmal nocturnal dyspnea   patient was advised to follow low-salt low-cholesterol diet    ideally I want to keep systolic blood pressure below 983 mmHg, patient was asked to check blood pressure one times a week and give me a report on that.  Patient will be follow-up in 3 months  or earlier as needed, patient will call me back for any change in the cardiovascular symptoms Patient was advised to buy a book from local bookstore concerning blood pressure and read several chapters  every day.  This will be supplemented by some of the material we will give him from the office.  Patient should also utilize other resources like YouTube and Internet to learn more about the blood pressure and the diet.        Digestive   Irritable bowel syndrome    Under control        Endocrine   Type 2 diabetes mellitus with complication, without long-term current use of insulin (HCC)    - The patient's blood sugar is labile on med. - The patient will continue the current treatment regimen.  - I encouraged the patient to regularly check blood sugar.  - I encouraged the patient to monitor diet. I encouraged the patient to eat low-carb and low-sugar to help prevent blood sugar spikes.  - I encouraged the patient to continue following their prescribed treatment plan for diabetes - I informed the patient to get help if blood sugar drops below 54mg /dL, or if suddenly have trouble thinking clearly or breathing.  Patient was advised to buy a book on diabetes from a local bookstore or from .   Patient should read 2 chapters every day to keep the motivation going, this is in addition to some of the materials we provided them from the office.  There are other resources on the Internet like YouTube and [employer]kip[employer] to get an education on the diabetes      Hypothyroidism    Stable at the present time        Nervous and Auditory   Axonal neuropathy    Refer to neurologist        Other   Anxiety     Keeping a stress/anxiety diary. This can help you learn what triggers your reaction and then learn ways to manage your response.  Thinking about how you react to certain situations. You may not be able to control everything, but you can control your response.  Making time for activities that help you relax and not feeling guilty about spending your time in  this way.  Visual imagery and yoga can help you stay calm and relax.      Relevant Medications   ALPRAZolam (XANAX) 0.5 MG tablet   Other Visit Diagnoses     Type 2 diabetes mellitus without complication, without long-term current use of insulin (HCC)    -  Primary   Relevant Orders   Microalbumin, urine   Need for shingles vaccine       Relevant Medications   Zoster Vaccine Adjuvanted [facility]) injection   Screening for colon cancer       Relevant Orders   Ambulatory referral to Gastroenterology   Need for influenza vaccination       Relevant Orders   Flu Vaccine QUAD High Dose(Fluad) (Completed)       Meds ordered this encounter  Medications   ALPRAZolam (XANAX) 0.5 MG tablet    Sig: Take 1 tablet (0.5 mg total) by mouth 2 (two) times daily.    Dispense:  60 tablet    Refill:  0   Zoster Vaccine Adjuvanted [facility]) injection    Sig: Inject 0.5 mLs into the muscle once for 1 dose. Please administer shingles vaccine and fax to provider at [phone]    Dispense:  0.5 mL    Refill:  1    Follow-up: No follow-ups on file.    Corky Downs, MD

## 2021-05-08 NOTE — Assessment & Plan Note (Signed)
   Keeping a stress/anxiety diary. This can help you learn what triggers your reaction and then learn ways to manage your response.  Thinking about how you react to certain situations. You may not be able to control everything, but you can control your response.  Making time for activities that help you relax and not feeling guilty about spending your time in this way.  Visual imagery and yoga can help you stay calm and relax. 

## 2021-05-08 NOTE — Assessment & Plan Note (Signed)

## 2021-05-08 NOTE — Assessment & Plan Note (Signed)

## 2021-05-10 LAB — MICROALBUMIN, URINE: Microalb, Ur: 0.6 mg/dL

## 2021-05-18 ENCOUNTER — Ambulatory Visit (INDEPENDENT_AMBULATORY_CARE_PROVIDER_SITE_OTHER): Payer: Medicare Other

## 2021-05-18 DIAGNOSIS — Z0000 Encounter for general adult medical examination without abnormal findings: Secondary | ICD-10-CM

## 2021-05-18 NOTE — Progress Notes (Signed)
Subjective:   Jenny [name] is a 75 y.o. female who presents for Medicare Annual (Subsequent) preventive examination. I discussed the limitations of evaluation and management by telemedicine and the availability of in person appointments. The patient expressed understanding and agreed to proceed.   Visit performed by audio   Patient location: Home  Provider location: Home   Review of Systems    N/A Cardiac Risk Factors include: diabetes mellitus     Objective:    There were no vitals filed for this visit. There is no height or weight on file to calculate BMI.  Advanced Directives 05/18/2021 07/05/2019 06/06/2019 12/29/2018 05/19/2018 04/29/2018 04/24/2017  Does Patient Have a Medical Advance Directive? No No No Yes No No No  Type of Advance Directive - - - Healthcare Power of Attorney - - -  Does patient want to make changes to medical advance directive? - - - No - Patient declined - - No - Patient declined  Copy of Healthcare Power of Attorney in Chart? - - - No - copy requested - - -  Would patient like information on creating a medical advance directive? No - Patient declined - - - No - Patient declined No - Patient declined No - Patient declined    Current Medications (verified) Outpatient Encounter Medications as of 05/18/2021  Medication Sig   alendronate (FOSAMAX) 70 MG tablet Take 70 mg by mouth once a week. Take with a full glass of water on an empty stomach.   ALPRAZolam (XANAX) 0.5 MG tablet Take 1 tablet (0.5 mg total) by mouth 2 (two) times daily.   Cholecalciferol (VITAMIN D3 PO) Take by mouth daily.   donepezil (ARICEPT) 10 MG tablet Take 1 tablet (10 mg total) by mouth at bedtime.   hydrOXYzine (ATARAX/VISTARIL) 25 MG tablet Take 25 mg by mouth 2 (two) times daily.    insulin glargine (LANTUS) 100 UNIT/ML injection Inject 25-35 Units into the skin See admin instructions. 35 units every morning and 25 units at bedtime   Insulin Syringe-Needle U-100 (INSULIN  SYRINGE .5CC/31GX5/16") 31G X 5/16" 0.5 ML MISC USE 1 NEEDLE TWICE DAILY   JANUVIA 100 MG tablet TAKE 1 TABLET BY MOUTH EVERY MORNING   Lancets (ONETOUCH DELICA PLUS LANCET30G) MISC USE TO TEST BLOOD SUGAR TWICE DAILY   levocetirizine (XYZAL) 5 MG tablet TAKE 1 TABLET(5 MG) BY MOUTH EVERY EVENING   levothyroxine (SYNTHROID) 88 MCG tablet TAKE 1 TABLET BY MOUTH DAILY   Loperamide HCl (IMODIUM A-D PO) Take by mouth as needed.   Melatonin 10 MG TABS Take by mouth at bedtime.   metFORMIN (GLUCOPHAGE) 500 MG tablet TAKE 1 TABLET BY MOUTH TWICE DAILY   ONETOUCH ULTRA test strip TEST THREE TIMES DAILY   Phenylephrine-DM-GG (MUCINEX FAST-MAX CONGEST COUGH PO) Take by mouth as needed.   rosuvastatin (CRESTOR) 10 MG tablet TAKE 1 TABLET BY MOUTH DAILY   sertraline (ZOLOFT) 100 MG tablet TAKE 1 TABLET BY MOUTH TWICE DAILY   [DISCONTINUED] methylPREDNISolone (MEDROL DOSEPAK) 4 MG TBPK tablet Use as directed (Patient not taking: Reported on 05/18/2021)   [DISCONTINUED] lidocaine (PF) (XYLOCAINE) 1 % injection 2 mL    [DISCONTINUED] triamcinolone acetonide (KENALOG-40) injection 40 mg    No facility-administered encounter medications on file as of 05/18/2021.    Allergies (verified) Iodine and Shellfish allergy   History: Past Medical History:  Diagnosis Date   Anxiety    Arthritis    CAD (coronary artery disease)    a. 01/2017 Cath: nonobs dzs.  Chronic systolic CHF (congestive heart failure) (HCC)    a. TTE 12/2016, EF 45-50%, probable hypokinesis of the mid apical anteroseptal, anterior, & apical myocardium, GR2DD, possibly bicuspid aortic valve that was moderately thickened w/ severely calcified leaflets, severe aortic stenosis w/ mean gradient 47 mmHg, valve area 0.52 cm, mild MR, mildly dilated LA   Depression    Diabetes mellitus with complication (HCC)    Tylenol   Hyperlipidemia    Hypothyroidism    IBS (irritable bowel syndrome)    Iron deficiency anemia    a. s/p pRBC x1 in 12/2016    Microcytic anemia    Osteoporosis    Panic attacks    Severe aortic stenosis    a. TTE 12/2016: possibly bicuspid aortic valve that was moderately thickened with severely calcified leaflets. There was severe aortic stenosis with a mean gradient of 47 mmHg and valve area of 0.52 cm; b. 01/2017 s/p AVR w/ 19 mm bioprosthetic Magna Ease pericardial tissue valve, ser# [id number].   Stroke [facility]) 2001   TIA (transient ischemic attack) 2006   Past Surgical History:  Procedure Laterality Date   ABDOMINAL HYSTERECTOMY     AORTIC VALVE REPLACEMENT N/A 02/17/2017   Procedure: AORTIC VALVE REPLACEMENT (AVR);  Surgeon: Donata Clay, Theron Arista, MD;  Location: [facility] OR;  Service: Open Heart Surgery;  Laterality: N/A;   CATARACT EXTRACTION W/PHACO Right 04/09/2016   Procedure: CATARACT EXTRACTION PHACO AND INTRAOCULAR LENS PLACEMENT (IOC);  Surgeon: Galen Manila, MD;  Location: [facility] ORS;  Service: Ophthalmology;  Laterality: Right;  [place] 57.2 AP% 18.6 CDE 10.64 Fluid Pack lot # [id number]   CATARACT EXTRACTION W/PHACO Left 12/29/2018   Procedure: CATARACT EXTRACTION PHACO AND INTRAOCULAR LENS PLACEMENT (IOC)  LEFT DIABETIC;  Surgeon: Galen Manila, MD;  Location: [facility];  Service: Ophthalmology;  Laterality: Left;  Diabetic - insuli and oral meds   CHOLECYSTECTOMY     COLONOSCOPY     CORONARY ANGIOGRAPHY N/A 02/03/2017   Procedure: CORONARY ANGIOGRAPHY;  Surgeon: Iran Ouch, MD;  Location: [facility] INVASIVE CV LAB;  Service: Cardiovascular;  Laterality: N/A;   FRACTURE SURGERY     LEFT ANKLE   RIGHT AND LEFT HEART CATH Bilateral 02/03/2017   Procedure: Right and Left Heart Cath;  Surgeon: Iran Ouch, MD;  Location: [facility] INVASIVE CV LAB;  Service: Cardiovascular;  Laterality: Bilateral;   TEE WITHOUT CARDIOVERSION N/A 02/17/2017   Procedure: TRANSESOPHAGEAL ECHOCARDIOGRAM (TEE);  Surgeon: Donata Clay, Theron Arista, MD;  Location: [facility] OR;  Service: Open Heart Surgery;  Laterality: N/A;   TIBIA IM NAIL  INSERTION Left 05/19/2018   Procedure: INTRAMEDULLARY (IM) NAIL TIBIAL;  Surgeon: Juanell Fairly, MD;  Location: [facility] ORS;  Service: Orthopedics;  Laterality: Left;   Family History  Problem Relation Age of Onset   Hypertension Mother    Social History   Socioeconomic History   Marital status: Married    Spouse name: Not on file   Number of children: 3   Years of education: 32   Highest education level: 12th grade  Occupational History   Occupation: Retired  Tobacco Use   Smoking status: Former    Years: 30.00    Types: Cigarettes    Quit date: 02/04/2000    Years since quitting: 21.2   Smokeless tobacco: Never  Vaping Use   Vaping Use: Never used  Substance and Sexual Activity   Alcohol use: No   Drug use: No   Sexual activity: Not Currently  Other Topics Concern   Not  on file  Social History Narrative   Not on file   Social Determinants of Health   Financial Resource Strain: Low Risk    Difficulty of Paying Living Expenses: Not hard at all  Food Insecurity: No Food Insecurity   Worried About Programme researcher, broadcasting/film/video in the Last Year: Never true   Barista in the Last Year: Never true  Transportation Needs: No Transportation Needs   Lack of Transportation (Medical): No   Lack of Transportation (Non-Medical): No  Physical Activity: Insufficiently Active   Days of Exercise per Week: 2 days   Minutes of Exercise per Session: 20 min  Stress: No Stress Concern Present   Feeling of Stress : Only a little  Social Connections: Press photographer of Communication with Friends and Family: More than three times a week   Frequency of Social Gatherings with Friends and Family: More than three times a week   Attends Religious Services: 1 to 4 times per year   Active Member of [employer] or Organizations: Yes   Attends Banker Meetings: 1 to 4 times per year   Marital Status: Married    Tobacco Counseling Counseling given: Not  Answered   Clinical Intake:  Pre-visit preparation completed: Yes  Pain : No/denies pain     Diabetes: Yes  How often do you need to have someone help you when you read instructions, pamphlets, or other written materials from your doctor or pharmacy?: 1 - Never What is the last grade level you completed in school?: 12th grade  Diabetic? Yes  Interpreter Needed?: No  Information entered by :: Garnet Sierras CMA   Activities of Daily Living In your present state of health, do you have any difficulty performing the following activities: 05/18/2021 04/04/2021  Hearing? N N  Vision? N N  Difficulty concentrating or making decisions? N N  Walking or climbing stairs? N N  Dressing or bathing? N N  Doing errands, shopping? N N  Preparing Food and eating ? N -  Using the Toilet? N -  In the past six months, have you accidently leaked urine? N -  Do you have problems with loss of bowel control? N -  Managing your Medications? Y -  Comment Husband does pt's meds -  Managing your Finances? N -  Housekeeping or managing your Housekeeping? N -  Some recent data might be hidden    Patient Care Team: Corky Downs, MD as PCP - General (Internal Medicine) Delma Freeze, FNP as Nurse Practitioner (Family Medicine)  Indicate any recent Medical Services you may have received from other than [facility] providers in the past year (date may be approximate).     Assessment:   This is a routine wellness examination for Hattie.  Hearing/Vision screen No results found.  Dietary issues and exercise activities discussed: Current Exercise Habits: Home exercise routine, Type of exercise: walking, Time (Minutes): 20, Frequency (Times/Week): 2, Weekly Exercise (Minutes/Week): 40, Intensity: Mild, Exercise limited by: None identified   Goals Addressed   None    Depression Screen PHQ 2/9 Scores 05/18/2021 04/04/2021 01/16/2021 03/02/2020 04/24/2017 03/04/2017 01/29/2017  PHQ - 2 Score 0 - 0 0 0 0 2   PHQ- 9 Score - - - - 1 - -  Exception Documentation - Patient refusal - - - - -    Fall Risk Fall Risk  05/18/2021 04/04/2021 01/16/2021 03/02/2020 04/24/2017  Falls in the past year? 0 0 0 1 Yes  Comment - - - - She has fallen 2-3 times in past year with injury to face during 1 fall; history of CVA in 2000 and states some residual weakness. 1 fall has been after her AVR but says she did not turn on the bathroom light and tripped over something on the floor.   Number falls in past yr: 0 0 0 1 2 or more  Injury with Fall? 0 0 0 1 Yes  Risk Factor Category  - - - - High Fall Risk  Risk for fall due to : No Fall Risks No Fall Risks No Fall Risks History of fall(s) History of fall(s);Medication side effect;Impaired balance/gait  Follow up Falls evaluation completed Falls evaluation completed Falls evaluation completed - Falls evaluation completed;Education provided;Falls prevention discussed    FALL RISK PREVENTION PERTAINING TO THE HOME:  Any stairs in or around the home? Yes  If so, are there any without handrails? No  Home free of loose throw rugs in walkways, pet beds, electrical cords, etc? Yes  Adequate lighting in your home to reduce risk of falls? Yes   ASSISTIVE DEVICES UTILIZED TO PREVENT FALLS:  Life alert? No  Use of a cane, walker or w/c? Yes  Grab bars in the bathroom? Yes  Shower chair or bench in shower? Yes  Elevated toilet seat or a handicapped toilet? No   TIMED UP AND GO:  Was the test performed? No .  Length of time to ambulate 10 feet: 0 sec.     Cognitive Function: MMSE - Mini Mental State Exam 02/26/2021 08/24/2020 05/23/2020 09/02/2019  Orientation to time 5 4 5 5   Orientation to Place 5 5 5 5   Registration 3 3 3 3   Attention/ Calculation 5 1 2 3   Recall 3 3 3 2   Language- name 2 objects 2 2 2 2   Language- repeat 1 1 1 1   Language- follow 3 step command 3 2 3 3   Language- read & follow direction 1 1 1 1   Write a sentence 1 1 1 1   Copy design 1 1 1 1    Total score 30 24 27 27    Montreal Cognitive Assessment  10/06/2019  Visuospatial/ Executive (0/5) 3  Naming (0/3) 3  Attention: Read list of digits (0/2) 2  Attention: Read list of letters (0/1) 1  Attention: Serial 7 subtraction starting at 100 (0/3) 1  Language: Repeat phrase (0/2) 1  Language : Fluency (0/1) 0  Abstraction (0/2) 2  Delayed Recall (0/5) 5  Orientation (0/6) 6  Total 24  Adjusted Score (based on education) 25   6CIT Screen 05/18/2021  What Year? 0 points  What month? 0 points  What time? 0 points  Count back from 20 0 points  Months in reverse 0 points  Repeat phrase 0 points  Total Score 0    Immunizations Immunization History  Administered Date(s) Administered   Fluad Quad(high Dose 65+) 03/02/2020, 05/08/2021   PFIZER(Purple Top)SARS-COV-2 Vaccination 08/16/2019, 09/06/2019, 05/24/2020   PNEUMOCOCCAL CONJUGATE-20 04/04/2021   Pneumococcal Polysaccharide-23 05/22/2018   Tdap 04/04/2021   Zoster Recombinat (Shingrix) 02/10/2013    TDAP status: Up to date  Flu Vaccine status: Up to date  Pneumococcal vaccine status: Up to date  Covid-19 vaccine status: Information provided on how to obtain vaccines.   Qualifies for Shingles Vaccine? Yes   Zostavax completed No   Shingrix Completed?: No.    Education has been provided regarding the importance of this vaccine. Patient has been advised to call  insurance company to determine out of pocket expense if they have not yet received this vaccine. Advised may also receive vaccine at local pharmacy or Health Dept. Verbalized acceptance and understanding.  Screening Tests Health Maintenance  Topic Date Due   FOOT EXAM  Never done   OPHTHALMOLOGY EXAM  Never done   Hepatitis C Screening  Never done   COLONOSCOPY (Pts 45-49yrs Insurance coverage will need to be confirmed)  Never done   MAMMOGRAM  Never done   Zoster Vaccines- Shingrix (2 of 2) 04/07/2013   COVID-19 Vaccine (4 - Booster for Pfizer series)  07/19/2020   DEXA SCAN  05/08/2022 (Originally 06/19/2013)   HEMOGLOBIN A1C  08/22/2021   URINE MICROALBUMIN  05/08/2022   TETANUS/TDAP  04/05/2031   Pneumonia Vaccine 50+ Years old  Completed   INFLUENZA VACCINE  Completed   HPV VACCINES  Aged Out    Health Maintenance  Health Maintenance Due  Topic Date Due   FOOT EXAM  Never done   OPHTHALMOLOGY EXAM  Never done   Hepatitis C Screening  Never done   COLONOSCOPY (Pts 45-53yrs Insurance coverage will need to be confirmed)  Never done   MAMMOGRAM  Never done   Zoster Vaccines- Shingrix (2 of 2) 04/07/2013   COVID-19 Vaccine (4 - Booster for Pfizer series) 07/19/2020    Colorectal cancer screening: Type of screening: Colonoscopy. Completed NO. Repeat every 10 years Pt declined at this time  Mammogram status: Completed No. Repeat every year    Lung Cancer Screening: (Low Dose CT Chest recommended if Age 21-80 years, 30 pack-year currently smoking OR have quit w/in 15years.) does not qualify.   Lung Cancer Screening Referral: No  Additional Screening:  Hepatitis C Screening: does qualify; Completed No  Vision Screening: Recommended annual ophthalmology exams for early detection of glaucoma and other disorders of the eye. Is the patient up to date with their annual eye exam?  Yes  Who is the provider or what is the name of the office in which the patient attends annual eye exams? [facility] If pt is not established with a provider, would they like to be referred to a provider to establish care? No .   Dental Screening: Recommended annual dental exams for proper oral hygiene  Community Resource Referral / Chronic Care Management: CRR required this visit?  No   CCM required this visit?  No      Plan:     I have personally reviewed and noted the following in the patient's chart:   Medical and social history Use of alcohol, tobacco or illicit drugs  Current medications and supplements including opioid  prescriptions.  Functional ability and status Nutritional status Physical activity Advanced directives List of other physicians Hospitalizations, surgeries, and ER visits in previous 12 months Vitals Screenings to include cognitive, depression, and falls Referrals and appointments  In addition, I have reviewed and discussed with patient certain preventive protocols, quality metrics, and best practice recommendations. A written personalized care plan for preventive services as well as general preventive health recommendations were provided to patient.    Ms. Spadafora , Thank you for taking time to come for your Medicare Wellness Visit. I appreciate your ongoing commitment to your health goals. Please review the following plan we discussed and let me know if I can assist you in the future.   These are the goals we discussed:  Goals   None     This is a list of the screening recommended  for you and due dates:  Health Maintenance  Topic Date Due   Complete foot exam   Never done   Eye exam for diabetics  Never done   Hepatitis C Screening: USPSTF Recommendation to screen - Ages 51-79 yo.  Never done   Colon Cancer Screening  Never done   Mammogram  Never done   Zoster (Shingles) Vaccine (2 of 2) 04/07/2013   COVID-19 Vaccine (4 - Booster for Pfizer series) 07/19/2020   DEXA scan (bone density measurement)  05/08/2022*   Hemoglobin A1C  08/22/2021   Urine Protein Check  05/08/2022   Tetanus Vaccine  04/05/2031   Pneumonia Vaccine  Completed   Flu Shot  Completed   HPV Vaccine  Aged Out  *Topic was postponed. The date shown is not the original due date.     Gracelyn Nurse, [place]   05/18/2021

## 2021-05-19 NOTE — Progress Notes (Signed)
I have reviewed this visit and agree with the documentation.   

## 2021-06-04 ENCOUNTER — Other Ambulatory Visit: Payer: Self-pay

## 2021-06-04 MED ORDER — AZITHROMYCIN 250 MG PO TABS: ORAL_TABLET | ORAL | 0 refills | Status: AC

## 2021-06-05 ENCOUNTER — Other Ambulatory Visit: Payer: Self-pay

## 2021-06-05 DIAGNOSIS — F419 Anxiety disorder, unspecified: Secondary | ICD-10-CM

## 2021-06-05 MED ORDER — ALPRAZOLAM 0.5 MG PO TABS: 0.5000 mg | ORAL_TABLET | Freq: Two times a day (BID) | ORAL | 0 refills | Status: DC

## 2021-06-07 ENCOUNTER — Ambulatory Visit: Payer: Medicare Other | Admitting: Internal Medicine

## 2021-07-04 ENCOUNTER — Other Ambulatory Visit: Payer: Self-pay | Admitting: Internal Medicine

## 2021-07-04 ENCOUNTER — Other Ambulatory Visit: Payer: Self-pay | Admitting: *Deleted

## 2021-07-04 DIAGNOSIS — F419 Anxiety disorder, unspecified: Secondary | ICD-10-CM

## 2021-07-04 MED ORDER — SERTRALINE HCL 100 MG PO TABS: 100.0000 mg | ORAL_TABLET | Freq: Two times a day (BID) | ORAL | 2 refills | Status: DC

## 2021-07-09 DIAGNOSIS — Z20822 Contact with and (suspected) exposure to covid-19: Secondary | ICD-10-CM | POA: Diagnosis not present

## 2021-08-14 ENCOUNTER — Encounter: Payer: Self-pay | Admitting: Internal Medicine

## 2021-08-14 ENCOUNTER — Ambulatory Visit (INDEPENDENT_AMBULATORY_CARE_PROVIDER_SITE_OTHER): Payer: Medicare Other | Admitting: Internal Medicine

## 2021-08-14 ENCOUNTER — Other Ambulatory Visit: Payer: Self-pay

## 2021-08-14 VITALS — BP 140/76 | HR 82 | Ht <= 58 in | Wt 125.0 lb

## 2021-08-14 DIAGNOSIS — E038 Other specified hypothyroidism: Secondary | ICD-10-CM

## 2021-08-14 DIAGNOSIS — I10 Essential (primary) hypertension: Secondary | ICD-10-CM

## 2021-08-14 DIAGNOSIS — E118 Type 2 diabetes mellitus with unspecified complications: Secondary | ICD-10-CM | POA: Diagnosis not present

## 2021-08-14 DIAGNOSIS — E063 Autoimmune thyroiditis: Secondary | ICD-10-CM | POA: Diagnosis not present

## 2021-08-14 DIAGNOSIS — F419 Anxiety disorder, unspecified: Secondary | ICD-10-CM

## 2021-08-14 DIAGNOSIS — Z952 Presence of prosthetic heart valve: Secondary | ICD-10-CM

## 2021-08-14 DIAGNOSIS — M8080XD Other osteoporosis with current pathological fracture, unspecified site, subsequent encounter for fracture with routine healing: Secondary | ICD-10-CM | POA: Diagnosis not present

## 2021-08-14 LAB — GLUCOSE, POCT (MANUAL RESULT ENTRY): POC Glucose: 175 mg/dl — AB (ref 70–99)

## 2021-08-14 MED ORDER — ALPRAZOLAM 0.5 MG PO TABS: 0.5000 mg | ORAL_TABLET | Freq: Two times a day (BID) | ORAL | 0 refills | Status: DC

## 2021-08-14 MED ORDER — LEVOCETIRIZINE DIHYDROCHLORIDE 5 MG PO TABS: ORAL_TABLET | ORAL | 2 refills | Status: DC

## 2021-08-14 MED ORDER — SERTRALINE HCL 100 MG PO TABS: 100.0000 mg | ORAL_TABLET | Freq: Two times a day (BID) | ORAL | 2 refills | Status: DC

## 2021-08-14 NOTE — Assessment & Plan Note (Signed)
No aortic regurgitation

## 2021-08-14 NOTE — Assessment & Plan Note (Signed)

## 2021-08-14 NOTE — Assessment & Plan Note (Signed)

## 2021-08-14 NOTE — Assessment & Plan Note (Signed)
Stable

## 2021-08-14 NOTE — Assessment & Plan Note (Signed)
Patient was advised to take levothyroxine in the morning

## 2021-08-14 NOTE — Assessment & Plan Note (Signed)
-   Patient experiencing high levels of anxiety.  - Encouraged patient to engage in relaxing activities like yoga, meditation, journaling, going for a walk, or participating in a hobby.  - Encouraged patient to reach out to trusted friends or family members about recent struggles, Patient was advised to read A book, how to stop worrying and start living, it is good book to read to control  the stress  

## 2021-08-14 NOTE — Progress Notes (Signed)
Established Patient Office Visit  Subjective:  Patient ID: Jenny [name], female    DOB: 1945/10/11  Age: 76 y.o. MRN: [id number]  CC:  Chief Complaint  Patient presents with   Diabetes    Patient's blood sugar is controlled with current regimen     Diabetes   Jenny [name] presents for  General check up Past Medical History:  Diagnosis Date   Anxiety    Arthritis    CAD (coronary artery disease)    a. 01/2017 Cath: nonobs dzs.   Chronic systolic CHF (congestive heart failure) (HCC)    a. TTE 12/2016, EF 45-50%, probable hypokinesis of the mid apical anteroseptal, anterior, & apical myocardium, GR2DD, possibly bicuspid aortic valve that was moderately thickened w/ severely calcified leaflets, severe aortic stenosis w/ mean gradient 47 mmHg, valve area 0.52 cm, mild MR, mildly dilated LA   Depression    Diabetes mellitus with complication (HCC)    Tylenol   Hyperlipidemia    Hypothyroidism    IBS (irritable bowel syndrome)    Iron deficiency anemia    a. s/p pRBC x1 in 12/2016   Microcytic anemia    Osteoporosis    Panic attacks    Severe aortic stenosis    a. TTE 12/2016: possibly bicuspid aortic valve that was moderately thickened with severely calcified leaflets. There was severe aortic stenosis with a mean gradient of 47 mmHg and valve area of 0.52 cm; b. 01/2017 s/p AVR w/ 19 mm bioprosthetic Magna Ease pericardial tissue valve, ser# [id number].   Stroke [facility]) 2001   TIA (transient ischemic attack) 2006    Past Surgical History:  Procedure Laterality Date   ABDOMINAL HYSTERECTOMY     AORTIC VALVE REPLACEMENT N/A 02/17/2017   Procedure: AORTIC VALVE REPLACEMENT (AVR);  Surgeon: Donata Clay, Theron Arista, MD;  Location: [facility] OR;  Service: Open Heart Surgery;  Laterality: N/A;   CATARACT EXTRACTION W/PHACO Right 04/09/2016   Procedure: CATARACT EXTRACTION PHACO AND INTRAOCULAR LENS PLACEMENT (IOC);  Surgeon: Galen Manila, MD;  Location: [facility] ORS;  Service: Ophthalmology;   Laterality: Right;  [place] 57.2 AP% 18.6 CDE 10.64 Fluid Pack lot # [id number]   CATARACT EXTRACTION W/PHACO Left 12/29/2018   Procedure: CATARACT EXTRACTION PHACO AND INTRAOCULAR LENS PLACEMENT (IOC)  LEFT DIABETIC;  Surgeon: Galen Manila, MD;  Location: [facility];  Service: Ophthalmology;  Laterality: Left;  Diabetic - insuli and oral meds   CHOLECYSTECTOMY     COLONOSCOPY     CORONARY ANGIOGRAPHY N/A 02/03/2017   Procedure: CORONARY ANGIOGRAPHY;  Surgeon: Iran Ouch, MD;  Location: [facility] INVASIVE CV LAB;  Service: Cardiovascular;  Laterality: N/A;   FRACTURE SURGERY     LEFT ANKLE   RIGHT AND LEFT HEART CATH Bilateral 02/03/2017   Procedure: Right and Left Heart Cath;  Surgeon: Iran Ouch, MD;  Location: [facility] INVASIVE CV LAB;  Service: Cardiovascular;  Laterality: Bilateral;   TEE WITHOUT CARDIOVERSION N/A 02/17/2017   Procedure: TRANSESOPHAGEAL ECHOCARDIOGRAM (TEE);  Surgeon: Donata Clay, Theron Arista, MD;  Location: [facility] OR;  Service: Open Heart Surgery;  Laterality: N/A;   TIBIA IM NAIL INSERTION Left 05/19/2018   Procedure: INTRAMEDULLARY (IM) NAIL TIBIAL;  Surgeon: Juanell Fairly, MD;  Location: [facility] ORS;  Service: Orthopedics;  Laterality: Left;    Family History  Problem Relation Age of Onset   Hypertension Mother     Social History   Socioeconomic History   Marital status: Married    Spouse name: Not on file   Number  of children: 3   Years of education: 12   Highest education level: 12th grade  Occupational History   Occupation: Retired  Tobacco Use   Smoking status: Former    Years: 30.00    Types: Cigarettes    Quit date: 02/04/2000    Years since quitting: 21.5   Smokeless tobacco: Never  Vaping Use   Vaping Use: Never used  Substance and Sexual Activity   Alcohol use: No   Drug use: No   Sexual activity: Not Currently  Other Topics Concern   Not on file  Social History Narrative   Not on file   Social Determinants of Health   Financial  Resource Strain: Low Risk    Difficulty of Paying Living Expenses: Not hard at all  Food Insecurity: No Food Insecurity   Worried About Programme researcher, broadcasting/film/video in the Last Year: Never true   Ran Out of Food in the Last Year: Never true  Transportation Needs: No Transportation Needs   Lack of Transportation (Medical): No   Lack of Transportation (Non-Medical): No  Physical Activity: Insufficiently Active   Days of Exercise per Week: 2 days   Minutes of Exercise per Session: 20 min  Stress: No Stress Concern Present   Feeling of Stress : Only a little  Social Connections: Press photographer of Communication with Friends and Family: More than three times a week   Frequency of Social Gatherings with Friends and Family: More than three times a week   Attends Religious Services: 1 to 4 times per year   Active Member of [employer] or Organizations: Yes   Attends Banker Meetings: 1 to 4 times per year   Marital Status: Married  Catering manager Violence: Not At Risk   Fear of Current or Ex-Partner: No   Emotionally Abused: No   Physically Abused: No   Sexually Abused: No     Current Outpatient Medications:    alendronate (FOSAMAX) 70 MG tablet, Take 70 mg by mouth once a week. Take with a full glass of water on an empty stomach., Disp: , Rfl:    Cholecalciferol (VITAMIN D3 PO), Take by mouth daily., Disp: , Rfl:    donepezil (ARICEPT) 10 MG tablet, Take 1 tablet (10 mg total) by mouth at bedtime., Disp: 90 tablet, Rfl: 3   hydrOXYzine (ATARAX/VISTARIL) 25 MG tablet, Take 25 mg by mouth 2 (two) times daily. , Disp: , Rfl:    insulin glargine (LANTUS) 100 UNIT/ML injection, Inject 25-35 Units into the skin See admin instructions. 35 units every morning and 25 units at bedtime, Disp: , Rfl:    Insulin Syringe-Needle U-100 (INSULIN SYRINGE .5CC/31GX5/16") 31G X 5/16" 0.5 ML MISC, USE 1 NEEDLE TWICE DAILY, Disp: 100 each, Rfl: 6   JANUVIA 100 MG tablet, TAKE 1 TABLET BY MOUTH  EVERY MORNING, Disp: 90 tablet, Rfl: 3   Lancets (ONETOUCH DELICA PLUS LANCET30G) MISC, USE TO TEST BLOOD SUGAR TWICE DAILY, Disp: 100 each, Rfl: 6   levothyroxine (SYNTHROID) 88 MCG tablet, TAKE 1 TABLET BY MOUTH DAILY, Disp: 90 tablet, Rfl: 3   Loperamide HCl (IMODIUM A-D PO), Take by mouth as needed., Disp: , Rfl:    Melatonin 10 MG TABS, Take by mouth at bedtime., Disp: , Rfl:    metFORMIN (GLUCOPHAGE) 500 MG tablet, TAKE 1 TABLET BY MOUTH TWICE DAILY, Disp: 180 tablet, Rfl: 3   ONETOUCH ULTRA test strip, TEST THREE TIMES DAILY, Disp: 100 strip, Rfl: 3  Phenylephrine-DM-GG (MUCINEX FAST-MAX CONGEST COUGH PO), Take by mouth as needed., Disp: , Rfl:    rosuvastatin (CRESTOR) 10 MG tablet, TAKE 1 TABLET BY MOUTH DAILY, Disp: 90 tablet, Rfl: 3   ALPRAZolam (XANAX) 0.5 MG tablet, Take 1 tablet (0.5 mg total) by mouth 2 (two) times daily., Disp: 60 tablet, Rfl: 0   levocetirizine (XYZAL) 5 MG tablet, TAKE 1 TABLET(5 MG) BY MOUTH EVERY EVENING, Disp: 90 tablet, Rfl: 2   sertraline (ZOLOFT) 100 MG tablet, Take 1 tablet (100 mg total) by mouth 2 (two) times daily., Disp: 180 tablet, Rfl: 2   Allergies  Allergen Reactions   Iodine Swelling    ANGIOEDEMA FACIAL SWELLING "BETADINE OKAY"   Shellfish Allergy Anaphylaxis    ROS Review of Systems  Constitutional: Negative.   HENT: Negative.    Eyes: Negative.   Respiratory: Negative.    Cardiovascular: Negative.   Gastrointestinal: Negative.   Endocrine: Negative.   Genitourinary: Negative.   Musculoskeletal: Negative.   Skin: Negative.   Allergic/Immunologic: Negative.   Neurological: Negative.   Hematological: Negative.   Psychiatric/Behavioral: Negative.    All other systems reviewed and are negative.    Objective:    Physical Exam Vitals reviewed.  Constitutional:      Appearance: Normal appearance.  HENT:     Mouth/Throat:     Mouth: Mucous membranes are moist.  Eyes:     Pupils: Pupils are equal, round, and reactive to  light.  Neck:     Vascular: No carotid bruit.  Cardiovascular:     Rate and Rhythm: Normal rate and regular rhythm.     Pulses: Normal pulses.     Heart sounds: Normal heart sounds.  Pulmonary:     Effort: Pulmonary effort is normal.     Breath sounds: Normal breath sounds.  Abdominal:     General: Bowel sounds are normal.     Palpations: Abdomen is soft. There is no hepatomegaly, splenomegaly or mass.     Tenderness: There is no abdominal tenderness.     Hernia: No hernia is present.  Musculoskeletal:        General: No tenderness.     Cervical back: Neck supple.     Right lower leg: No edema.     Left lower leg: No edema.  Skin:    Findings: No rash.  Neurological:     Mental Status: She is alert and oriented to person, place, and time.     Motor: No weakness.  Psychiatric:        Mood and Affect: Mood and affect normal.        Behavior: Behavior normal.    BP 140/76    Pulse 82    Ht 4\' 10"  (1.473 m)    Wt 125 lb (56.7 kg)    BMI 26.13 kg/m  Wt Readings from Last 3 Encounters:  08/14/21 125 lb (56.7 kg)  02/26/21 121 lb 6.4 oz (55.1 kg)  01/16/21 119 lb 1.6 oz (54 kg)     Health Maintenance Due  Topic Date Due   FOOT EXAM  Never done   OPHTHALMOLOGY EXAM  Never done   Hepatitis C Screening  Never done   MAMMOGRAM  Never done   Zoster Vaccines- Shingrix (2 of 2) 04/07/2013   COVID-19 Vaccine (4 - Booster for Pfizer series) 07/19/2020    There are no preventive care reminders to display for this patient.  Lab Results  Component Value Date   TSH 2.450 05/23/2020  Lab Results  Component Value Date   WBC 4.1 01/17/2020   HGB 12.9 01/17/2020   HCT 39.1 01/17/2020   MCV 87.9 01/17/2020   PLT 87 (L) 01/17/2020   Lab Results  Component Value Date   NA 138 05/23/2020   K 4.8 05/23/2020   CO2 24 05/23/2020   GLUCOSE 67 05/23/2020   BUN 10 05/23/2020   CREATININE 0.83 05/23/2020   BILITOT 0.4 05/23/2020   ALKPHOS 95 05/23/2020   AST 43 (H) 05/23/2020    ALT 22 05/23/2020   PROT 7.2 05/23/2020   ALBUMIN 3.9 05/23/2020   CALCIUM 9.5 05/23/2020   ANIONGAP 8 06/06/2019   Lab Results  Component Value Date   CHOL 92 01/15/2017   Lab Results  Component Value Date   HDL 36 (L) 01/15/2017   Lab Results  Component Value Date   LDLCALC 37 01/15/2017   Lab Results  Component Value Date   TRIG 97 01/15/2017   Lab Results  Component Value Date   CHOLHDL 2.6 01/15/2017   Lab Results  Component Value Date   HGBA1C 5.6 02/19/2021      Assessment & Plan:   Problem List Items Addressed This Visit       Cardiovascular and Mediastinum   Essential hypertension     Patient denies any chest pain or shortness of breath there is no history of palpitation or paroxysmal nocturnal dyspnea   patient was advised to follow low-salt low-cholesterol diet    ideally I want to keep systolic blood pressure below 340 mmHg, patient was asked to check blood pressure one times a week and give me a report on that.  Patient will be follow-up in 3 months  or earlier as needed, patient will call me back for any change in the cardiovascular symptoms Patient was advised to buy a book from local bookstore concerning blood pressure and read several chapters  every day.  This will be supplemented by some of the material we will give him from the office.  Patient should also utilize other resources like YouTube and Internet to learn more about the blood pressure and the diet.        Endocrine   Type 2 diabetes mellitus with complication, without long-term current use of insulin (HCC) - Primary    - The patient's blood sugar is labile on med. - The patient will continue the current treatment regimen.  - I encouraged the patient to regularly check blood sugar.  - I encouraged the patient to monitor diet. I encouraged the patient to eat low-carb and low-sugar to help prevent blood sugar spikes.  - I encouraged the patient to continue following their prescribed  treatment plan for diabetes - I informed the patient to get help if blood sugar drops below 54mg /dL, or if suddenly have trouble thinking clearly or breathing.  Patient was advised to buy a book on diabetes from a local bookstore or from [place].  Patient should read 2 chapters every day to keep the motivation going, this is in addition to some of the materials we provided them from the office.  There are other resources on the Internet like YouTube and wilkipedia to get an education on the diabetes      Relevant Orders   POCT glucose (manual entry) (Completed)   Hypothyroidism    Patient was advised to take levothyroxine in the morning        Musculoskeletal and Integument   Osteoporosis    Stable  Other   S/P AVR (aortic valve replacement)    No aortic regurgitation      Anxiety    - Patient experiencing high levels of anxiety.  - Encouraged patient to engage in relaxing activities like yoga, meditation, journaling, going for a walk, or participating in a hobby.  - Encouraged patient to reach out to trusted friends or family members about recent struggles, Patient was advised to read A book, how to stop worrying and start living, it is good book to read to control  the stress       Relevant Medications   sertraline (ZOLOFT) 100 MG tablet   ALPRAZolam (XANAX) 0.5 MG tablet    Meds ordered this encounter  Medications   sertraline (ZOLOFT) 100 MG tablet    Sig: Take 1 tablet (100 mg total) by mouth 2 (two) times daily.    Dispense:  180 tablet    Refill:  2   levocetirizine (XYZAL) 5 MG tablet    Sig: TAKE 1 TABLET(5 MG) BY MOUTH EVERY EVENING    Dispense:  90 tablet    Refill:  2    **Patient requests 90 days supply**   ALPRAZolam (XANAX) 0.5 MG tablet    Sig: Take 1 tablet (0.5 mg total) by mouth 2 (two) times daily.    Dispense:  60 tablet    Refill:  0    Follow-up: No follow-ups on file.    Corky Downs, MD

## 2021-08-28 ENCOUNTER — Other Ambulatory Visit: Payer: Self-pay | Admitting: Internal Medicine

## 2021-09-05 ENCOUNTER — Other Ambulatory Visit: Payer: Self-pay | Admitting: Internal Medicine

## 2021-09-11 ENCOUNTER — Encounter: Payer: Self-pay | Admitting: Internal Medicine

## 2021-09-11 ENCOUNTER — Ambulatory Visit (INDEPENDENT_AMBULATORY_CARE_PROVIDER_SITE_OTHER): Payer: Medicare Other | Admitting: Internal Medicine

## 2021-09-11 ENCOUNTER — Other Ambulatory Visit: Payer: Self-pay

## 2021-09-11 VITALS — BP 147/69 | HR 72 | Ht <= 58 in | Wt 123.2 lb

## 2021-09-11 DIAGNOSIS — E118 Type 2 diabetes mellitus with unspecified complications: Secondary | ICD-10-CM

## 2021-09-11 DIAGNOSIS — F419 Anxiety disorder, unspecified: Secondary | ICD-10-CM | POA: Diagnosis not present

## 2021-09-11 DIAGNOSIS — K589 Irritable bowel syndrome without diarrhea: Secondary | ICD-10-CM

## 2021-09-11 DIAGNOSIS — R413 Other amnesia: Secondary | ICD-10-CM | POA: Diagnosis not present

## 2021-09-11 DIAGNOSIS — G6289 Other specified polyneuropathies: Secondary | ICD-10-CM | POA: Diagnosis not present

## 2021-09-11 DIAGNOSIS — M12812 Other specific arthropathies, not elsewhere classified, left shoulder: Secondary | ICD-10-CM | POA: Diagnosis not present

## 2021-09-11 DIAGNOSIS — I10 Essential (primary) hypertension: Secondary | ICD-10-CM

## 2021-09-11 LAB — GLUCOSE, POCT (MANUAL RESULT ENTRY): POC Glucose: 214 mg/dl — AB (ref 70–99)

## 2021-09-11 MED ORDER — ALPRAZOLAM 0.5 MG PO TABS: 0.5000 mg | ORAL_TABLET | Freq: Two times a day (BID) | ORAL | 0 refills | Status: DC

## 2021-09-11 NOTE — Assessment & Plan Note (Signed)
Chronic problem. 

## 2021-09-11 NOTE — Progress Notes (Signed)
? ?Established Patient Office Visit ? ?Subjective:  ?Patient ID: Jenny BreedCynthia K [name], female    DOB: [birth year]  Age: 76 y.o. MRN: [id number] ? ?CC:  ?Chief Complaint  ?Patient presents with  ? Diabetes  ? Anxiety  ?  Patient here for refill of xanax   ? ? ?HPI ? ?Jenny BiblesCynthia K [name] presents for check up ? ?Past Medical History:  ?Diagnosis Date  ? Anxiety   ? Arthritis   ? CAD (coronary artery disease)   ? a. 01/2017 Cath: nonobs dzs.  ? Chronic systolic CHF (congestive heart failure) (HCC)   ? a. TTE 12/2016, EF 45-50%, probable hypokinesis of the mid apical anteroseptal, anterior, & apical myocardium, GR2DD, possibly bicuspid aortic valve that was moderately thickened w/ severely calcified leaflets, severe aortic stenosis w/ mean gradient 47 mmHg, valve area 0.52 cm?, mild MR, mildly dilated LA  ? Depression   ? Diabetes mellitus with complication (HCC)   ? Tylenol  ? Hyperlipidemia   ? Hypothyroidism   ? IBS (irritable bowel syndrome)   ? Iron deficiency anemia   ? a. s/p pRBC x1 in 12/2016  ? Microcytic anemia   ? Osteoporosis   ? Panic attacks   ? Severe aortic stenosis   ? a. TTE 12/2016: possibly bicuspid aortic valve that was moderately thickened with severely calcified leaflets. There was severe aortic stenosis with a mean gradient of 47 mmHg and valve area of 0.52 cm?; b. 01/2017 s/p AVR w/ 19 mm bioprosthetic Magna Ease pericardial tissue valve, ser# [id number].  ? Stroke [facility](HCC) 2001  ? TIA (transient ischemic attack) 2006  ? ? ?Past Surgical History:  ?Procedure Laterality Date  ? ABDOMINAL HYSTERECTOMY    ? AORTIC VALVE REPLACEMENT N/A 02/17/2017  ? Procedure: AORTIC VALVE REPLACEMENT (AVR);  Surgeon: [name], [name], MD;  [facility];  Service: Open Heart Surgery;  Laterality: N/A;  ? CATARACT EXTRACTION W/PHACO Right 04/09/2016  ? Procedure: CATARACT EXTRACTION PHACO AND INTRAOCULAR LENS PLACEMENT (IOC);  Surgeon: [name], MD;  Location: [facility] ORS;  Service: Ophthalmology;  Laterality: Right;  US  57.2 ?AP% 18.6 ?CDE 10.64 ?Fluid Pack lot # [id number]  ? CATARACT EXTRACTION W/PHACO Left 12/29/2018  ? Procedure: CATARACT EXTRACTION PHACO AND INTRAOCULAR LENS PLACEMENT (IOC)  LEFT DIABETIC;  Surgeon: Galen ManilaPorfilio, William, MD;  [facility];  Service: Ophthalmology;  Laterality: Left;  Diabetic - insuli and oral meds  ? CHOLECYSTECTOMY    ? COLONOSCOPY    ? CORONARY ANGIOGRAPHY N/A 02/03/2017  ? Procedure: CORONARY ANGIOGRAPHY;  Surgeon: [name], Muhammad A, MD;  Location: [facility] INVASIVE CV LAB;  Service: Cardiovascular;  Laterality: N/A;  ? FRACTURE SURGERY    ? LEFT ANKLE  ? RIGHT AND LEFT HEART CATH Bilateral 02/03/2017  ? Procedure: Right and Left Heart Cath;  Surgeon: [name], Muhammad A, MD;  Location: [facility] INVASIVE CV LAB;  Service: Cardiovascular;  Laterality: Bilateral;  ? TEE WITHOUT CARDIOVERSION N/A 02/17/2017  ? Procedure: TRANSESOPHAGEAL ECHOCARDIOGRAM (TEE);  Surgeon: [name], [name], MD;  [facility];  Service: Open Heart Surgery;  Laterality: N/A;  ? TIBIA IM NAIL INSERTION Left 05/19/2018  ? Procedure: INTRAMEDULLARY (IM) NAIL TIBIAL;  Surgeon: [name], Kevin, MD;  Location: [facility] ORS;  Service: Orthopedics;  Laterality: Left;  ? ? ?Family History  ?Problem Relation Age of Onset  ? Hypertension Mother   ? ? ?Social History  ? ?Socioeconomic History  ? Marital status: Married  ?  Spouse name: Not on file  ? Number of  children: 3  ? Years of education: 26  ? Highest education level: 12th grade  ?Occupational History  ? Occupation: Retired  ?Tobacco Use  ? Smoking status: Former  ?  Years: 30.00  ?  Types: Cigarettes  ?  Quit date: 02/04/2000  ?  Years since quitting: 21.6  ? Smokeless tobacco: Never  ?Vaping Use  ? Vaping Use: Never used  ?Substance and Sexual Activity  ? Alcohol use: No  ? Drug use: No  ? Sexual activity: Not Currently  ?Other Topics Concern  ? Not on file  ?Social History Narrative  ? Not on file  ? ?Social Determinants of Health  ? ?Financial Resource Strain: Low Risk   ?  Difficulty of Paying Living Expenses: Not hard at all  ?Food Insecurity: No Food Insecurity  ? Worried About Programme researcher, broadcasting/film/video in the Last Year: Never true  ? Ran Out of Food in the Last Year: Never true  ?Transportation Needs: No Transportation Needs  ? Lack of Transportation (Medical): No  ? Lack of Transportation (Non-Medical): No  ?Physical Activity: Insufficiently Active  ? Days of Exercise per Week: 2 days  ? Minutes of Exercise per Session: 20 min  ?Stress: No Stress Concern Present  ? Feeling of Stress : Only a little  ?Social Connections: Socially Integrated  ? Frequency of Communication with Friends and Family: More than three times a week  ? Frequency of Social Gatherings with Friends and Family: More than three times a week  ? Attends Religious Services: 1 to 4 times per year  ? Active Member of Clubs or Organizations: Yes  ? Attends Banker Meetings: 1 to 4 times per year  ? Marital Status: Married  ?Intimate Partner Violence: Not At Risk  ? Fear of Current or Ex-Partner: No  ? Emotionally Abused: No  ? Physically Abused: No  ? Sexually Abused: No  ? ? ? ?Current Outpatient Medications:  ?  alendronate (FOSAMAX) 70 MG tablet, Take 70 mg by mouth once a week. Take with a full glass of water on an empty stomach., Disp: , Rfl:  ?  ALPRAZolam (XANAX) 0.5 MG tablet, Take 1 tablet (0.5 mg total) by mouth 2 (two) times daily., Disp: 60 tablet, Rfl: 0 ?  Cholecalciferol (VITAMIN D3 PO), Take by mouth daily., Disp: , Rfl:  ?  donepezil (ARICEPT) 10 MG tablet, Take 1 tablet (10 mg total) by mouth at bedtime., Disp: 90 tablet, Rfl: 3 ?  hydrOXYzine (ATARAX/VISTARIL) 25 MG tablet, Take 25 mg by mouth 2 (two) times daily. , Disp: , Rfl:  ?  insulin glargine (LANTUS) 100 UNIT/ML injection, Inject 25-35 Units into the skin See admin instructions. 35 units every morning and 25 units at bedtime, Disp: , Rfl:  ?  Insulin Syringe-Needle U-100 (INSULIN SYRINGE .5CC/31GX5/16") 31G X 5/16" 0.5 ML MISC, USE 1  NEEDLE TWICE DAILY, Disp: 100 each, Rfl: 6 ?  JANUVIA 100 MG tablet, TAKE 1 TABLET BY MOUTH EVERY MORNING, Disp: 90 tablet, Rfl: 3 ?  Lancets (ONETOUCH DELICA PLUS LANCET30G) MISC, USE TO TEST BLOOD SUGAR TWICE DAILY, Disp: 100 each, Rfl: 6 ?  levocetirizine (XYZAL) 5 MG tablet, TAKE 1 TABLET(5 MG) BY MOUTH EVERY EVENING, Disp: 90 tablet, Rfl: 2 ?  levothyroxine (SYNTHROID) 88 MCG tablet, TAKE 1 TABLET BY MOUTH DAILY, Disp: 90 tablet, Rfl: 3 ?  Loperamide HCl (IMODIUM A-D PO), Take by mouth as needed., Disp: , Rfl:  ?  Melatonin 10 MG TABS, Take by  mouth at bedtime., Disp: , Rfl:  ?  metFORMIN (GLUCOPHAGE) 500 MG tablet, TAKE 1 TABLET BY MOUTH TWICE DAILY, Disp: 180 tablet, Rfl: 3 ?  ONETOUCH ULTRA test strip, TEST THREE TIMES DAILY, Disp: 100 strip, Rfl: 3 ?  Phenylephrine-DM-GG (MUCINEX FAST-MAX CONGEST COUGH PO), Take by mouth as needed., Disp: , Rfl:  ?  rosuvastatin (CRESTOR) 10 MG tablet, TAKE 1 TABLET BY MOUTH DAILY, Disp: 90 tablet, Rfl: 3 ?  sertraline (ZOLOFT) 100 MG tablet, Take 1 tablet (100 mg total) by mouth 2 (two) times daily., Disp: 180 tablet, Rfl: 2  ? ?Allergies  ?Allergen Reactions  ? Iodine Swelling  ?  ANGIOEDEMA ?FACIAL SWELLING ?"BETADINE OKAY"  ? Shellfish Allergy Anaphylaxis  ? ? ?ROS ?Review of Systems  ?Constitutional: Negative.   ?HENT: Negative.    ?Eyes: Negative.   ?Respiratory: Negative.    ?Cardiovascular: Negative.   ?Gastrointestinal: Negative.   ?Endocrine: Negative.   ?Genitourinary: Negative.   ?Musculoskeletal:  Positive for gait problem.  ?     Wekness of rt arm  ?Skin: Negative.   ?Allergic/Immunologic: Negative.   ?Hematological: Negative.   ?Psychiatric/Behavioral: Negative.    ?All other systems reviewed and are negative. ? ?  ?Objective:  ?  ?Physical Exam ?Vitals reviewed.  ?Constitutional:   ?   Appearance: Normal appearance.  ?HENT:  ?   Mouth/Throat:  ?   Mouth: Mucous membranes are moist.  ?Eyes:  ?   Pupils: Pupils are equal, round, and reactive to light.  ?Neck:   ?   Vascular: No carotid bruit.  ?Cardiovascular:  ?   Rate and Rhythm: Normal rate and regular rhythm.  ?   Pulses: Normal pulses.  ?   Heart sounds: Normal heart sounds.  ?Pulmonary:  ?   Effort: Pulmon

## 2021-09-11 NOTE — Assessment & Plan Note (Signed)
Memory loss is better ?

## 2021-09-11 NOTE — Assessment & Plan Note (Signed)
Stable at the present time. 

## 2021-09-11 NOTE — Assessment & Plan Note (Signed)
Stable

## 2021-09-11 NOTE — Assessment & Plan Note (Signed)

## 2021-09-13 ENCOUNTER — Other Ambulatory Visit: Payer: Self-pay | Admitting: *Deleted

## 2021-09-13 ENCOUNTER — Other Ambulatory Visit: Payer: Self-pay

## 2021-09-14 ENCOUNTER — Other Ambulatory Visit: Payer: Self-pay | Admitting: *Deleted

## 2021-09-14 DIAGNOSIS — G629 Polyneuropathy, unspecified: Secondary | ICD-10-CM

## 2021-10-01 ENCOUNTER — Other Ambulatory Visit: Payer: Self-pay | Admitting: *Deleted

## 2021-10-01 MED ORDER — METFORMIN HCL 500 MG PO TABS: 500.0000 mg | ORAL_TABLET | Freq: Two times a day (BID) | ORAL | 3 refills | Status: DC

## 2021-10-09 ENCOUNTER — Ambulatory Visit (INDEPENDENT_AMBULATORY_CARE_PROVIDER_SITE_OTHER): Payer: Medicare Other | Admitting: Internal Medicine

## 2021-10-09 ENCOUNTER — Encounter: Payer: Self-pay | Admitting: Internal Medicine

## 2021-10-09 VITALS — BP 140/56 | HR 72 | Ht <= 58 in | Wt 123.3 lb

## 2021-10-09 DIAGNOSIS — G6289 Other specified polyneuropathies: Secondary | ICD-10-CM

## 2021-10-09 DIAGNOSIS — E119 Type 2 diabetes mellitus without complications: Secondary | ICD-10-CM | POA: Diagnosis not present

## 2021-10-09 DIAGNOSIS — I10 Essential (primary) hypertension: Secondary | ICD-10-CM

## 2021-10-09 DIAGNOSIS — F419 Anxiety disorder, unspecified: Secondary | ICD-10-CM

## 2021-10-09 DIAGNOSIS — E118 Type 2 diabetes mellitus with unspecified complications: Secondary | ICD-10-CM | POA: Diagnosis not present

## 2021-10-09 DIAGNOSIS — R413 Other amnesia: Secondary | ICD-10-CM | POA: Diagnosis not present

## 2021-10-09 LAB — GLUCOSE, POCT (MANUAL RESULT ENTRY): POC Glucose: 96 mg/dl (ref 70–99)

## 2021-10-09 MED ORDER — ALPRAZOLAM 0.5 MG PO TABS: 0.5000 mg | ORAL_TABLET | Freq: Two times a day (BID) | ORAL | 0 refills | Status: DC

## 2021-10-09 NOTE — Assessment & Plan Note (Signed)
-   Patient experiencing high levels of anxiety.  - Encouraged patient to engage in relaxing activities like yoga, meditation, journaling, going for a walk, or participating in a hobby.  - Encouraged patient to reach out to trusted friends or family members about recent struggles, Patient was advised to read A book, how to stop worrying and start living, it is good book to read to control  the stress  

## 2021-10-09 NOTE — Progress Notes (Signed)
? ?Established Patient Office Visit ? ?Subjective:  ?Patient ID: Jenny [name], female    DOB: 1946-01-14  Age: 76 y.o. MRN: [id number] ? ?CC:  ?Chief Complaint  ?Patient presents with  ? Anxiety  ? Diabetes  ? ? ?Anxiety ? ? ? ?Diabetes ? ? ?Jenny [name] presents for anxiety and diabetes check, she has not had any chest pain or shortness of breath chest examination is normal there is no abdominal or back tenderness ? ?Past Medical History:  ?Diagnosis Date  ? Anxiety   ? Arthritis   ? CAD (coronary artery disease)   ? a. 01/2017 Cath: nonobs dzs.  ? Chronic systolic CHF (congestive heart failure) (HCC)   ? a. TTE 12/2016, EF 45-50%, probable hypokinesis of the mid apical anteroseptal, anterior, & apical myocardium, GR2DD, possibly bicuspid aortic valve that was moderately thickened w/ severely calcified leaflets, severe aortic stenosis w/ mean gradient 47 mmHg, valve area 0.52 cm?, mild MR, mildly dilated LA  ? Depression   ? Diabetes mellitus with complication (HCC)   ? Tylenol  ? Hyperlipidemia   ? Hypothyroidism   ? IBS (irritable bowel syndrome)   ? Iron deficiency anemia   ? a. s/p pRBC x1 in 12/2016  ? Microcytic anemia   ? Osteoporosis   ? Panic attacks   ? Severe aortic stenosis   ? a. TTE 12/2016: possibly bicuspid aortic valve that was moderately thickened with severely calcified leaflets. There was severe aortic stenosis with a mean gradient of 47 mmHg and valve area of 0.52 cm?; b. 01/2017 s/p AVR w/ 19 mm bioprosthetic Magna Ease pericardial tissue valve, ser# [id number].  ? Stroke [facility]) 2001  ? TIA (transient ischemic attack) 2006  ? ? ?Past Surgical History:  ?Procedure Laterality Date  ? ABDOMINAL HYSTERECTOMY    ? AORTIC VALVE REPLACEMENT N/A 02/17/2017  ? Procedure: AORTIC VALVE REPLACEMENT (AVR);  Surgeon: Donata Clay, Theron Arista, MD;  Location: [facility] OR;  Service: Open Heart Surgery;  Laterality: N/A;  ? CATARACT EXTRACTION W/PHACO Right 04/09/2016  ? Procedure: CATARACT EXTRACTION PHACO AND INTRAOCULAR  LENS PLACEMENT (IOC);  Surgeon: Galen Manila, MD;  Location: [facility] ORS;  Service: Ophthalmology;  Laterality: Right;  [place] 57.2 ?AP% 18.6 ?CDE 10.64 ?Fluid Pack lot # [id number]  ? CATARACT EXTRACTION W/PHACO Left 12/29/2018  ? Procedure: CATARACT EXTRACTION PHACO AND INTRAOCULAR LENS PLACEMENT (IOC)  LEFT DIABETIC;  Surgeon: Galen Manila, MD;  [facility]: [facility];  Service: Ophthalmology;  Laterality: Left;  Diabetic - insuli and oral meds  ? CHOLECYSTECTOMY    ? COLONOSCOPY    ? CORONARY ANGIOGRAPHY N/A 02/03/2017  ? Procedure: CORONARY ANGIOGRAPHY;  Surgeon: Iran Ouch, MD;  Location: [facility] INVASIVE CV LAB;  Service: Cardiovascular;  Laterality: N/A;  ? FRACTURE SURGERY    ? LEFT ANKLE  ? RIGHT AND LEFT HEART CATH Bilateral 02/03/2017  ? Procedure: Right and Left Heart Cath;  Surgeon: Iran Ouch, MD;  Location: [facility] INVASIVE CV LAB;  Service: Cardiovascular;  Laterality: Bilateral;  ? TEE WITHOUT CARDIOVERSION N/A 02/17/2017  ? Procedure: TRANSESOPHAGEAL ECHOCARDIOGRAM (TEE);  Surgeon: Donata Clay, Theron Arista, MD;  Location: [facility] OR;  Service: Open Heart Surgery;  Laterality: N/A;  ? TIBIA IM NAIL INSERTION Left 05/19/2018  ? Procedure: INTRAMEDULLARY (IM) NAIL TIBIAL;  Surgeon: Juanell Fairly, MD;  Location: [facility] ORS;  Service: Orthopedics;  Laterality: Left;  ? ? ?Family History  ?Problem Relation Age of Onset  ? Hypertension Mother   ? ? ?Social History  ? ?  Socioeconomic History  ? Marital status: Married  ?  Spouse name: Not on file  ? Number of children: 3  ? Years of education: 4112  ? Highest education level: 12th grade  ?Occupational History  ? Occupation: Retired  ?Tobacco Use  ? Smoking status: Former  ?  Years: 30.00  ?  Types: Cigarettes  ?  Quit date: 02/04/2000  ?  Years since quitting: 21.6  ? Smokeless tobacco: Never  ?Vaping Use  ? Vaping Use: Never used  ?Substance and Sexual Activity  ? Alcohol use: No  ? Drug use: No  ? Sexual activity: Not Currently  ?Other Topics Concern  ? Not on  file  ?Social History Narrative  ? Not on file  ? ?Social Determinants of Health  ? ?Financial Resource Strain: Low Risk   ? Difficulty of Paying Living Expenses: Not hard at all  ?Food Insecurity: No Food Insecurity  ? Worried About Programme researcher, broadcasting/film/videounning Out of Food in the Last Year: Never true  ? Ran Out of Food in the Last Year: Never true  ?Transportation Needs: No Transportation Needs  ? Lack of Transportation (Medical): No  ? Lack of Transportation (Non-Medical): No  ?Physical Activity: Insufficiently Active  ? Days of Exercise per Week: 2 days  ? Minutes of Exercise per Session: 20 min  ?Stress: No Stress Concern Present  ? Feeling of Stress : Only a little  ?Social Connections: Socially Integrated  ? Frequency of Communication with Friends and Family: More than three times a week  ? Frequency of Social Gatherings with Friends and Family: More than three times a week  ? Attends Religious Services: 1 to 4 times per year  ? Active Member of Clubs or Organizations: Yes  ? Attends BankerClub or Organization Meetings: 1 to 4 times per year  ? Marital Status: Married  ?Intimate Partner Violence: Not At Risk  ? Fear of Current or Ex-Partner: No  ? Emotionally Abused: No  ? Physically Abused: No  ? Sexually Abused: No  ? ? ? ?Current Outpatient Medications:  ?  alendronate (FOSAMAX) 70 MG tablet, Take 70 mg by mouth once a week. Take with a full glass of water on an empty stomach., Disp: , Rfl:  ?  Cholecalciferol (VITAMIN D3 PO), Take by mouth daily., Disp: , Rfl:  ?  donepezil (ARICEPT) 10 MG tablet, Take 1 tablet (10 mg total) by mouth at bedtime., Disp: 90 tablet, Rfl: 3 ?  hydrOXYzine (ATARAX/VISTARIL) 25 MG tablet, Take 25 mg by mouth 2 (two) times daily. , Disp: , Rfl:  ?  insulin glargine (LANTUS) 100 UNIT/ML injection, Inject 25-35 Units into the skin See admin instructions. 35 units every morning and 25 units at bedtime, Disp: , Rfl:  ?  Insulin Syringe-Needle U-100 (INSULIN SYRINGE .5CC/31GX5/16") 31G X 5/16" 0.5 ML MISC, USE  1 NEEDLE TWICE DAILY, Disp: 100 each, Rfl: 6 ?  JANUVIA 100 MG tablet, TAKE 1 TABLET BY MOUTH EVERY MORNING, Disp: 90 tablet, Rfl: 3 ?  Lancets (ONETOUCH DELICA PLUS LANCET30G) MISC, USE TO TEST BLOOD SUGAR TWICE DAILY, Disp: 100 each, Rfl: 6 ?  levocetirizine (XYZAL) 5 MG tablet, TAKE 1 TABLET(5 MG) BY MOUTH EVERY EVENING, Disp: 90 tablet, Rfl: 2 ?  levothyroxine (SYNTHROID) 88 MCG tablet, TAKE 1 TABLET BY MOUTH DAILY, Disp: 90 tablet, Rfl: 3 ?  Loperamide HCl (IMODIUM A-D PO), Take by mouth as needed., Disp: , Rfl:  ?  Melatonin 10 MG TABS, Take by mouth at bedtime., Disp: ,  Rfl:  ?  metFORMIN (GLUCOPHAGE) 500 MG tablet, Take 1 tablet (500 mg total) by mouth 2 (two) times daily., Disp: 180 tablet, Rfl: 3 ?  ONETOUCH ULTRA test strip, TEST THREE TIMES DAILY, Disp: 100 strip, Rfl: 3 ?  Phenylephrine-DM-GG (MUCINEX FAST-MAX CONGEST COUGH PO), Take by mouth as needed., Disp: , Rfl:  ?  rosuvastatin (CRESTOR) 10 MG tablet, TAKE 1 TABLET BY MOUTH DAILY, Disp: 90 tablet, Rfl: 3 ?  sertraline (ZOLOFT) 100 MG tablet, Take 1 tablet (100 mg total) by mouth 2 (two) times daily., Disp: 180 tablet, Rfl: 2 ?  ALPRAZolam (XANAX) 0.5 MG tablet, Take 1 tablet (0.5 mg total) by mouth 2 (two) times daily., Disp: 60 tablet, Rfl: 0  ? ?Allergies  ?Allergen Reactions  ? Iodine Swelling  ?  ANGIOEDEMA ?FACIAL SWELLING ?"BETADINE OKAY"  ? Shellfish Allergy Anaphylaxis  ? ? ?ROS ?Review of Systems  ?Constitutional: Negative.   ?HENT: Negative.    ?Eyes: Negative.   ?Respiratory: Negative.    ?Cardiovascular: Negative.   ?Gastrointestinal: Negative.   ?Endocrine: Negative.   ?Genitourinary: Negative.   ?Musculoskeletal: Negative.   ?Skin: Negative.   ?Allergic/Immunologic: Negative.   ?Neurological: Negative.   ?Hematological: Negative.   ?Psychiatric/Behavioral: Negative.    ?All other systems reviewed and are negative. ? ?  ?Objective:  ?  ?Physical Exam ?Vitals reviewed.  ?Constitutional:   ?   Appearance: Normal appearance.  ?HENT:  ?    Mouth/Throat:  ?   Mouth: Mucous membranes are moist.  ?Eyes:  ?   Pupils: Pupils are equal, round, and reactive to light.  ?Neck:  ?   Vascular: No carotid bruit.  ?Cardiovascular:  ?   Rate and Rhyth

## 2021-10-09 NOTE — Assessment & Plan Note (Signed)
Blood sugars under control 

## 2021-10-09 NOTE — Assessment & Plan Note (Signed)
Patient is referred to neurologist ?

## 2021-10-09 NOTE — Assessment & Plan Note (Signed)
Stable at the present time. 

## 2021-11-01 ENCOUNTER — Other Ambulatory Visit: Payer: Self-pay | Admitting: Internal Medicine

## 2021-11-02 ENCOUNTER — Other Ambulatory Visit: Payer: Self-pay

## 2021-11-02 MED ORDER — ONETOUCH ULTRA VI STRP: ORAL_STRIP | 3 refills | Status: DC

## 2021-11-06 ENCOUNTER — Encounter: Payer: Self-pay | Admitting: Internal Medicine

## 2021-11-06 ENCOUNTER — Ambulatory Visit (INDEPENDENT_AMBULATORY_CARE_PROVIDER_SITE_OTHER): Payer: Medicare Other | Admitting: Internal Medicine

## 2021-11-06 VITALS — BP 154/74 | HR 80 | Ht <= 58 in | Wt 124.8 lb

## 2021-11-06 DIAGNOSIS — F419 Anxiety disorder, unspecified: Secondary | ICD-10-CM

## 2021-11-06 DIAGNOSIS — E118 Type 2 diabetes mellitus with unspecified complications: Secondary | ICD-10-CM

## 2021-11-06 DIAGNOSIS — E038 Other specified hypothyroidism: Secondary | ICD-10-CM

## 2021-11-06 DIAGNOSIS — K589 Irritable bowel syndrome without diarrhea: Secondary | ICD-10-CM

## 2021-11-06 DIAGNOSIS — E119 Type 2 diabetes mellitus without complications: Secondary | ICD-10-CM | POA: Diagnosis not present

## 2021-11-06 DIAGNOSIS — G6289 Other specified polyneuropathies: Secondary | ICD-10-CM

## 2021-11-06 DIAGNOSIS — M12812 Other specific arthropathies, not elsewhere classified, left shoulder: Secondary | ICD-10-CM

## 2021-11-06 DIAGNOSIS — E063 Autoimmune thyroiditis: Secondary | ICD-10-CM

## 2021-11-06 DIAGNOSIS — I10 Essential (primary) hypertension: Secondary | ICD-10-CM | POA: Diagnosis not present

## 2021-11-06 DIAGNOSIS — M25512 Pain in left shoulder: Secondary | ICD-10-CM

## 2021-11-06 LAB — GLUCOSE, POCT (MANUAL RESULT ENTRY): POC Glucose: 119 mg/dl — AB (ref 70–99)

## 2021-11-06 MED ORDER — ALPRAZOLAM 0.5 MG PO TABS: 0.5000 mg | ORAL_TABLET | Freq: Two times a day (BID) | ORAL | 0 refills | Status: DC

## 2021-11-06 NOTE — Assessment & Plan Note (Signed)
Left shoulder was injected with Kenalog under sterile conditions, patient tolerated procedure well ?

## 2021-11-06 NOTE — Progress Notes (Signed)
? ?Established Patient Office Visit ? ?Subjective:  ?Patient ID: Jenny [name], female    DOB: 05-12-1946  Age: 76 y.o. MRN: [id number] ? ?CC:  ?Chief Complaint  ?Patient presents with  ? Diabetes  ?  1 month follow up ?  ? ? ?Diabetes ? ? ?Jenny [name] presents for painful lt shoulder pain, ?Patient is known to have diabetes hypertension which is under control.  He complains of pain in the left shoulder she has trouble raising the left hand above the shoulder scratching the back combing her hair with the left hand.  [place] ? ?Past Medical History:  ?Diagnosis Date  ? Anxiety   ? Arthritis   ? CAD (coronary artery disease)   ? a. 01/2017 Cath: nonobs dzs.  ? Chronic systolic CHF (congestive heart failure) ([place])   ? a. TTE 12/2016, EF 45-50%, probable hypokinesis of the mid apical anteroseptal, anterior, & apical myocardium, GR2DD, possibly bicuspid aortic valve that was moderately thickened w/ severely calcified leaflets, severe aortic stenosis w/ mean gradient 47 mmHg, valve area 0.52 cm?, mild MR, mildly dilated LA  ? Depression   ? Diabetes mellitus with complication ([place])   ? Tylenol  ? Hyperlipidemia   ? Hypothyroidism   ? IBS (irritable bowel syndrome)   ? Iron deficiency anemia   ? a. s/p pRBC x1 in 12/2016  ? Microcytic anemia   ? Osteoporosis   ? Panic attacks   ? Severe aortic stenosis   ? a. TTE 12/2016: possibly bicuspid aortic valve that was moderately thickened with severely calcified leaflets. There was severe aortic stenosis with a mean gradient of 47 mmHg and valve area of 0.52 cm?; b. 01/2017 s/p AVR w/ 19 mm bioprosthetic Magna Ease pericardial tissue valve, ser# [id number].  ? Stroke [facility]) 2001  ? TIA (transient ischemic attack) 2006  ? ? ?Past Surgical History:  ?Procedure Laterality Date  ? ABDOMINAL HYSTERECTOMY    ? AORTIC VALVE REPLACEMENT N/A 02/17/2017  ? Procedure: AORTIC VALVE REPLACEMENT (AVR);  Surgeon: Prescott Gum, Collier Salina, MD;  Location: [place];  Service: Open Heart Surgery;  Laterality:  N/A;  ? CATARACT EXTRACTION W/PHACO Right 04/09/2016  ? Procedure: CATARACT EXTRACTION PHACO AND INTRAOCULAR LENS PLACEMENT (IOC);  Surgeon: Birder Robson, MD;  Location: [facility] ORS;  Service: Ophthalmology;  Laterality: Right;  [place] 57.2 ?AP% 18.6 ?CDE 10.64 ?Fluid Pack lot # [id number]  ? CATARACT EXTRACTION W/PHACO Left 12/29/2018  ? Procedure: CATARACT EXTRACTION PHACO AND INTRAOCULAR LENS PLACEMENT ([place])  LEFT DIABETIC;  Surgeon: Birder Robson, MD;  Location: [place];  Service: Ophthalmology;  Laterality: Left;  Diabetic - insuli and oral meds  ? CHOLECYSTECTOMY    ? COLONOSCOPY    ? CORONARY ANGIOGRAPHY N/A 02/03/2017  ? Procedure: CORONARY ANGIOGRAPHY;  Surgeon: Wellington Hampshire, MD;  Location: [place] CV LAB;  Service: Cardiovascular;  Laterality: N/A;  ? FRACTURE SURGERY    ? LEFT ANKLE  ? RIGHT AND LEFT HEART CATH Bilateral 02/03/2017  ? Procedure: Right and Left Heart Cath;  Surgeon: Wellington Hampshire, MD;  Location: [place] CV LAB;  Service: Cardiovascular;  Laterality: Bilateral;  ? TEE WITHOUT CARDIOVERSION N/A 02/17/2017  ? Procedure: TRANSESOPHAGEAL ECHOCARDIOGRAM (TEE);  Surgeon: Prescott Gum, Collier Salina, MD;  Location: [place];  Service: Open Heart Surgery;  Laterality: N/A;  ? TIBIA IM NAIL INSERTION Left 05/19/2018  ? Procedure: INTRAMEDULLARY (IM) NAIL TIBIAL;  Surgeon: Thornton Park, MD;  Location: [facility] ORS;  Service: Orthopedics;  Laterality: Left;  ? ? ?  Family History  ?Problem Relation Age of Onset  ? Hypertension Mother   ? ? ?Social History  ? ?Socioeconomic History  ? Marital status: Married  ?  Spouse name: Not on file  ? Number of children: 3  ? Years of education: 11  ? Highest education level: 12th grade  ?Occupational History  ? Occupation: Retired  ?Tobacco Use  ? Smoking status: Former  ?  Years: 30.00  ?  Types: Cigarettes  ?  Quit date: 02/04/2000  ?  Years since quitting: 21.7  ? Smokeless tobacco: Never  ?Vaping Use  ? Vaping Use: Never used  ?Substance and Sexual  Activity  ? Alcohol use: No  ? Drug use: No  ? Sexual activity: Not Currently  ?Other Topics Concern  ? Not on file  ?Social History Narrative  ? Not on file  ? ?Social Determinants of Health  ? ?Financial Resource Strain: Low Risk   ? Difficulty of Paying Living Expenses: Not hard at all  ?Food Insecurity: No Food Insecurity  ? Worried About Charity fundraiser in the Last Year: Never true  ? Ran Out of Food in the Last Year: Never true  ?Transportation Needs: No Transportation Needs  ? Lack of Transportation (Medical): No  ? Lack of Transportation (Non-Medical): No  ?Physical Activity: Insufficiently Active  ? Days of Exercise per Week: 2 days  ? Minutes of Exercise per Session: 20 min  ?Stress: No Stress Concern Present  ? Feeling of Stress : Only a little  ?Social Connections: Socially Integrated  ? Frequency of Communication with Friends and Family: More than three times a week  ? Frequency of Social Gatherings with Friends and Family: More than three times a week  ? Attends Religious Services: 1 to 4 times per year  ? Active Member of Clubs or Organizations: Yes  ? Attends Archivist Meetings: 1 to 4 times per year  ? Marital Status: Married  ?Intimate Partner Violence: Not At Risk  ? Fear of Current or Ex-Partner: No  ? Emotionally Abused: No  ? Physically Abused: No  ? Sexually Abused: No  ? ? ? ?Current Outpatient Medications:  ?  alendronate (FOSAMAX) 70 MG tablet, Take 70 mg by mouth once a week. Take with a full glass of water on an empty stomach., Disp: , Rfl:  ?  Cholecalciferol (VITAMIN D3 PO), Take by mouth daily., Disp: , Rfl:  ?  donepezil (ARICEPT) 10 MG tablet, Take 1 tablet (10 mg total) by mouth at bedtime., Disp: 90 tablet, Rfl: 3 ?  glucose blood (ONETOUCH ULTRA) test strip, TEST BLOOD SUGAR THREE TIMES DAILY, Disp: 100 strip, Rfl: 3 ?  hydrOXYzine (ATARAX/VISTARIL) 25 MG tablet, Take 25 mg by mouth 2 (two) times daily. , Disp: , Rfl:  ?  insulin glargine (LANTUS) 100 UNIT/ML  injection, Inject 25-35 Units into the skin See admin instructions. 35 units every morning and 25 units at bedtime, Disp: , Rfl:  ?  Insulin Syringe-Needle U-100 (INSULIN SYRINGE .5CC/31GX5/16") 31G X 5/16" 0.5 ML MISC, USE 1 NEEDLE TWICE DAILY, Disp: 100 each, Rfl: 6 ?  JANUVIA 100 MG tablet, TAKE 1 TABLET BY MOUTH EVERY MORNING, Disp: 90 tablet, Rfl: 3 ?  Lancets (ONETOUCH DELICA PLUS Q000111Q) MISC, USE TO TEST BLOOD SUGAR TWICE DAILY, Disp: 100 each, Rfl: 6 ?  levocetirizine (XYZAL) 5 MG tablet, TAKE 1 TABLET(5 MG) BY MOUTH EVERY EVENING, Disp: 90 tablet, Rfl: 2 ?  levothyroxine (SYNTHROID) 88 MCG tablet, TAKE  1 TABLET BY MOUTH DAILY, Disp: 90 tablet, Rfl: 3 ?  Loperamide HCl (IMODIUM A-D PO), Take by mouth as needed., Disp: , Rfl:  ?  Melatonin 10 MG TABS, Take by mouth at bedtime., Disp: , Rfl:  ?  metFORMIN (GLUCOPHAGE) 500 MG tablet, Take 1 tablet (500 mg total) by mouth 2 (two) times daily., Disp: 180 tablet, Rfl: 3 ?  rosuvastatin (CRESTOR) 10 MG tablet, TAKE 1 TABLET BY MOUTH DAILY, Disp: 90 tablet, Rfl: 3 ?  sertraline (ZOLOFT) 100 MG tablet, Take 1 tablet (100 mg total) by mouth 2 (two) times daily., Disp: 180 tablet, Rfl: 2 ?  ALPRAZolam (XANAX) 0.5 MG tablet, Take 1 tablet (0.5 mg total) by mouth 2 (two) times daily., Disp: 60 tablet, Rfl: 0  ? ?Allergies  ?Allergen Reactions  ? Iodine Swelling  ?  ANGIOEDEMA ?FACIAL SWELLING ?"BETADINE OKAY"  ? Shellfish Allergy Anaphylaxis  ? ? ?ROS ?Review of Systems  ?Constitutional: Negative.   ?HENT: Negative.    ?Eyes: Negative.   ?Respiratory: Negative.    ?Cardiovascular: Negative.   ?Gastrointestinal: Negative.   ?Endocrine: Negative.   ?Genitourinary: Negative.   ?Musculoskeletal: Negative.   ?Skin: Negative.   ?Allergic/Immunologic: Negative.   ?Neurological: Negative.   ?Hematological: Negative.   ?Psychiatric/Behavioral: Negative.    ?All other systems reviewed and are negative. ? ?  ?Objective:  ?  ?Physical Exam ?Vitals reviewed.  ?Constitutional:   ?    Appearance: Normal appearance.  ?HENT:  ?   Mouth/Throat:  ?   Mouth: Mucous membranes are moist.  ?Eyes:  ?   Pupils: Pupils are equal, round, and reactive to light.  ?Neck:  ?   Vascular: No carotid brui

## 2021-11-06 NOTE — Assessment & Plan Note (Signed)

## 2021-11-06 NOTE — Assessment & Plan Note (Signed)
SIBO diet explained 

## 2021-11-06 NOTE — Assessment & Plan Note (Signed)

## 2021-11-06 NOTE — Assessment & Plan Note (Signed)
-   Patient experiencing high levels of anxiety.  - Encouraged patient to engage in relaxing activities like yoga, meditation, journaling, going for a walk, or participating in a hobby.  - Encouraged patient to reach out to trusted friends or family members about recent struggles, Patient was advised to read A book, how to stop worrying and start living, it is good book to read to control  the stress  

## 2021-11-06 NOTE — Assessment & Plan Note (Signed)
Patient was advised to take her thyroid medication in the morning ?

## 2021-11-06 NOTE — Assessment & Plan Note (Signed)
Stable at the present time. 

## 2021-11-22 ENCOUNTER — Telehealth: Payer: Self-pay | Admitting: Neurology

## 2021-11-22 ENCOUNTER — Ambulatory Visit (INDEPENDENT_AMBULATORY_CARE_PROVIDER_SITE_OTHER): Payer: Medicare Other | Admitting: Neurology

## 2021-11-22 ENCOUNTER — Encounter: Payer: Self-pay | Admitting: Neurology

## 2021-11-22 VITALS — BP 138/74 | HR 73 | Ht 59.0 in | Wt 122.6 lb

## 2021-11-22 DIAGNOSIS — G6289 Other specified polyneuropathies: Secondary | ICD-10-CM

## 2021-11-22 DIAGNOSIS — R768 Other specified abnormal immunological findings in serum: Secondary | ICD-10-CM | POA: Diagnosis not present

## 2021-11-22 DIAGNOSIS — R413 Other amnesia: Secondary | ICD-10-CM

## 2021-11-22 DIAGNOSIS — M2550 Pain in unspecified joint: Secondary | ICD-10-CM | POA: Diagnosis not present

## 2021-11-22 DIAGNOSIS — D890 Polyclonal hypergammaglobulinemia: Secondary | ICD-10-CM

## 2021-11-22 NOTE — Telephone Encounter (Signed)
Referral for Rheumatology sent to [facility] [phone]. 

## 2021-11-22 NOTE — Telephone Encounter (Signed)
Referral for Rheumatology sent to [facility] [phone]. 
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
## 2021-11-22 NOTE — Patient Instructions (Signed)
It was nice to see you both again today.  I would not recommend adding a new medication to your regimen quite yet.  Your symptoms are not classic for neuropathy pain.  As discussed, we will make a referral to rheumatology and hematology to evaluate you further.  Please continue with good diabetes control.  Please also mention to orthopedic surgeon your hand pain as you may have what we call Dupuytren's contracture of the right hand.  Please try to hydrate better with water. Please follow-up as scheduled to see Shawnie Dapper, NP.

## 2021-11-22 NOTE — Telephone Encounter (Signed)
Referral for Hematology/Oncology sent to [facility] [phone].

## 2021-11-27 NOTE — Telephone Encounter (Signed)
Patient's husband, Mellody DanceKeith, left a voicemail with our office asking for a call to discuss the hematology referral for this patient.  He reports that they called him this morning but [place].  He requested the hematology office be in [place].  Please call him back at [phone].

## 2021-11-28 ENCOUNTER — Other Ambulatory Visit: Payer: Self-pay | Admitting: *Deleted

## 2021-11-28 DIAGNOSIS — E876 Hypokalemia: Secondary | ICD-10-CM

## 2021-11-28 DIAGNOSIS — M069 Rheumatoid arthritis, unspecified: Secondary | ICD-10-CM

## 2021-11-28 DIAGNOSIS — D649 Anemia, unspecified: Secondary | ICD-10-CM

## 2021-11-29 ENCOUNTER — Telehealth: Payer: Self-pay | Admitting: Neurology

## 2021-11-29 NOTE — Telephone Encounter (Signed)
Patients husband Mellody Dance called and stated his PCP has put in both hematology and rheumatology referrals for him both in [place] locations. Can close out referrals on our end, let him know if they need any records for the referrals or have any questions to give [place] a call back.FYI

## 2021-12-03 ENCOUNTER — Other Ambulatory Visit: Payer: Self-pay | Admitting: *Deleted

## 2021-12-03 ENCOUNTER — Ambulatory Visit: Payer: Medicare Other | Admitting: Internal Medicine

## 2021-12-03 DIAGNOSIS — F419 Anxiety disorder, unspecified: Secondary | ICD-10-CM

## 2021-12-03 MED ORDER — ALPRAZOLAM 0.5 MG PO TABS: 0.5000 mg | ORAL_TABLET | Freq: Two times a day (BID) | ORAL | 0 refills | Status: DC

## 2021-12-04 ENCOUNTER — Ambulatory Visit: Payer: Medicare Other | Admitting: Internal Medicine

## 2021-12-10 ENCOUNTER — Inpatient Hospital Stay: Payer: Medicare Other | Attending: Internal Medicine | Admitting: Internal Medicine

## 2021-12-10 ENCOUNTER — Other Ambulatory Visit: Payer: Self-pay

## 2021-12-10 ENCOUNTER — Ambulatory Visit: Payer: Medicare Other | Admitting: Internal Medicine

## 2021-12-10 ENCOUNTER — Encounter: Payer: Self-pay | Admitting: Internal Medicine

## 2021-12-10 ENCOUNTER — Inpatient Hospital Stay: Payer: Medicare Other

## 2021-12-10 VITALS — BP 126/61 | HR 90 | Temp 97.9°F | Ht 59.0 in | Wt 118.8 lb

## 2021-12-10 DIAGNOSIS — R634 Abnormal weight loss: Secondary | ICD-10-CM | POA: Diagnosis not present

## 2021-12-10 DIAGNOSIS — D890 Polyclonal hypergammaglobulinemia: Secondary | ICD-10-CM | POA: Diagnosis not present

## 2021-12-10 DIAGNOSIS — G629 Polyneuropathy, unspecified: Secondary | ICD-10-CM | POA: Diagnosis not present

## 2021-12-10 DIAGNOSIS — M2550 Pain in unspecified joint: Secondary | ICD-10-CM | POA: Insufficient documentation

## 2021-12-10 DIAGNOSIS — D696 Thrombocytopenia, unspecified: Secondary | ICD-10-CM | POA: Insufficient documentation

## 2021-12-10 DIAGNOSIS — Z79899 Other long term (current) drug therapy: Secondary | ICD-10-CM | POA: Diagnosis not present

## 2021-12-10 DIAGNOSIS — Z809 Family history of malignant neoplasm, unspecified: Secondary | ICD-10-CM | POA: Diagnosis not present

## 2021-12-10 DIAGNOSIS — Z87891 Personal history of nicotine dependence: Secondary | ICD-10-CM | POA: Diagnosis not present

## 2021-12-10 DIAGNOSIS — R531 Weakness: Secondary | ICD-10-CM | POA: Diagnosis not present

## 2021-12-10 LAB — CBC WITH DIFFERENTIAL/PLATELET
Abs Immature Granulocytes: 0.02 10*3/uL (ref 0.00–0.07)
Basophils Absolute: 0 10*3/uL (ref 0.0–0.1)
Basophils Relative: 1 %
Eosinophils Absolute: 0.1 10*3/uL (ref 0.0–0.5)
Eosinophils Relative: 3 %
HCT: 38.8 % (ref 36.0–46.0)
Hemoglobin: 12.7 g/dL (ref 12.0–15.0)
Immature Granulocytes: 0 %
Lymphocytes Relative: 22 %
Lymphs Abs: 1.2 10*3/uL (ref 0.7–4.0)
MCH: 29.6 pg (ref 26.0–34.0)
MCHC: 32.7 g/dL (ref 30.0–36.0)
MCV: 90.4 fL (ref 80.0–100.0)
Monocytes Absolute: 0.4 10*3/uL (ref 0.1–1.0)
Monocytes Relative: 7 %
Neutro Abs: 3.6 10*3/uL (ref 1.7–7.7)
Neutrophils Relative %: 67 %
Platelets: 85 10*3/uL — ABNORMAL LOW (ref 150–400)
RBC: 4.29 MIL/uL (ref 3.87–5.11)
RDW: 15.4 % (ref 11.5–15.5)
WBC: 5.3 10*3/uL (ref 4.0–10.5)
nRBC: 0 % (ref 0.0–0.2)

## 2021-12-10 LAB — COMPREHENSIVE METABOLIC PANEL
ALT: 19 U/L (ref 0–44)
AST: 33 U/L (ref 15–41)
Albumin: 3.5 g/dL (ref 3.5–5.0)
Alkaline Phosphatase: 63 U/L (ref 38–126)
Anion gap: 6 (ref 5–15)
BUN: 16 mg/dL (ref 8–23)
CO2: 27 mmol/L (ref 22–32)
Calcium: 9.6 mg/dL (ref 8.9–10.3)
Chloride: 103 mmol/L (ref 98–111)
Creatinine, Ser: 0.89 mg/dL (ref 0.44–1.00)
GFR, Estimated: >60 mL/min (ref >60)
Glucose, Bld: 142 mg/dL — ABNORMAL HIGH (ref 70–99)
Potassium: 3.8 mmol/L (ref 3.5–5.1)
Sodium: 136 mmol/L (ref 135–145)
Total Bilirubin: 0.4 mg/dL (ref 0.3–1.2)
Total Protein: 7.2 g/dL (ref 6.5–8.1)

## 2021-12-10 LAB — C-REACTIVE PROTEIN: CRP: 0.6 mg/dL (ref <1.0)

## 2021-12-10 LAB — LACTATE DEHYDROGENASE: LDH: 144 U/L (ref 98–192)

## 2021-12-10 NOTE — Assessment & Plan Note (Addendum)
#   POLYCLONAL GAMMOPATHY- [2022-NOV]-the etiology is unclear however suspect underlying inflammation especially given patient's joint pains/muscle pain.  Recommend reevaluation of gammopathy with-myeloma panel kappa lambda light chain ratio; LDH and also CBC; CMP.  Check CRP  #Weight loss unclear etiology lost about 6 pounds in the last few months.  Recommend chest x-ray; and also CT scan abdomen pelvis with contrast.  #Chronic mild to moderate thrombocytopenia-question ITP versus others.  Await above work-up.   # PN [id number]- [place]-neurology];  Mild dementia/congnitive- ?   # Joint pain/leg weakness- awaiting rheuamatology evaluation  Thank you Dr.Athar for allowing me to participate in the care of your pleasant patient. Please do not hesitate to contact me with questions or concerns in the interim.  # DISPOSITION: # labs today-ordered.  # follow up in 2-3 weeks- MD; No labs- CT scan Abpelvis; CXR  PRIOR- Dr.B

## 2021-12-10 NOTE — Progress Notes (Signed)
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 Poor historian.   Jenny [name] 76 y.o.  female has been referred to [place] by neurology for further evaluation/work-up for polyclonal gammopathy.  Patient history of chronic neuropathy of the bilateral lower extremities.  Patient being followed by neurology.  Patient complains of multiple complaints including joint pains.  Complains of difficulty eating because of for chewing capacity because of her dentures.  States to have lost significant weight in the last many months.  At least on my review patient has lost about 6 pounds in last 1 month.  Colonoscopy 15-20 years.  Intermittent difficulty swallowing.  None currently.   Review of Systems  Constitutional:  Positive for malaise/fatigue and weight loss. Negative for chills, diaphoresis and fever.  HENT:  Negative for nosebleeds and sore throat.   Eyes:  Negative for double vision.  Respiratory:  Negative for cough, hemoptysis, sputum production, shortness of breath and wheezing.   Cardiovascular:  Negative for chest pain, palpitations, orthopnea and leg swelling.  Gastrointestinal:  Negative for abdominal pain, blood in stool, constipation, diarrhea, heartburn, melena, nausea and vomiting.  Genitourinary:  Negative for dysuria, frequency and urgency.  Musculoskeletal:  Positive for back pain and joint pain.  Skin: Negative.  Negative for itching and rash.  Neurological:   Positive for tingling. Negative for dizziness, focal weakness, weakness and headaches.  Endo/Heme/Allergies:  Does not bruise/bleed easily.  Psychiatric/Behavioral:  Negative for depression. The patient is not nervous/anxious and does not have insomnia.     MEDICAL HISTORY:  Past Medical History:  Diagnosis Date   Anxiety    Arthritis    CAD (coronary artery disease)    a. 01/2017 Cath: nonobs dzs.   Chronic systolic CHF (congestive heart failure) (HCC)    a. TTE 12/2016, EF 45-50%, probable hypokinesis of the mid apical anteroseptal, anterior, & apical myocardium, GR2DD, possibly bicuspid aortic valve that was moderately thickened w/ severely calcified leaflets, severe aortic stenosis w/ mean gradient 47 mmHg, valve area 0.52 cm, mild MR, mildly dilated LA   Depression    Diabetes mellitus with complication (HCC)    Tylenol   Hyperlipidemia    Hypothyroidism    IBS (irritable bowel syndrome)    Iron deficiency anemia    a. s/p pRBC x1 in 12/2016   Microcytic anemia    Osteoporosis    Panic attacks    Severe aortic stenosis    a. TTE 12/2016: possibly bicuspid aortic valve that was moderately thickened with severely calcified leaflets. There was severe aortic stenosis with a mean gradient of 47 mmHg and valve area of 0.52 cm; b. 01/2017 s/p AVR w/ 19 mm bioprosthetic Magna Ease pericardial tissue valve, ser# [id number].   Stroke [facility]) 2001   TIA (transient ischemic attack) 2006    SURGICAL HISTORY: Past Surgical History:  Procedure Laterality Date   ABDOMINAL HYSTERECTOMY     AORTIC VALVE REPLACEMENT N/A 02/17/2017   Procedure: AORTIC VALVE REPLACEMENT (AVR);  Surgeon: Donata Clay, Theron Arista, MD;  Location: [facility] OR;  Service: Open Heart Surgery;  Laterality: N/A;  CATARACT EXTRACTION W/PHACO Right 04/09/2016   Procedure: CATARACT EXTRACTION PHACO AND INTRAOCULAR LENS PLACEMENT (IOC);  Surgeon: Galen Manila, MD;  Location: [facility] ORS;  Service: Ophthalmology;  Laterality: Right;  [place]  57.2 AP% 18.6 CDE 10.64 Fluid Pack lot # [id number]   CATARACT EXTRACTION W/PHACO Left 12/29/2018   Procedure: CATARACT EXTRACTION PHACO AND INTRAOCULAR LENS PLACEMENT (IOC)  LEFT DIABETIC;  Surgeon: Galen Manila, MD;  Location: [facility];  Service: Ophthalmology;  Laterality: Left;  Diabetic - insuli and oral meds   CHOLECYSTECTOMY     COLONOSCOPY     CORONARY ANGIOGRAPHY N/A 02/03/2017   Procedure: CORONARY ANGIOGRAPHY;  Surgeon: Iran Ouch, MD;  Location: [facility] INVASIVE CV LAB;  Service: Cardiovascular;  Laterality: N/A;   FRACTURE SURGERY     LEFT ANKLE   RIGHT AND LEFT HEART CATH Bilateral 02/03/2017   Procedure: Right and Left Heart Cath;  Surgeon: Iran Ouch, MD;  Location: [facility] INVASIVE CV LAB;  Service: Cardiovascular;  Laterality: Bilateral;   TEE WITHOUT CARDIOVERSION N/A 02/17/2017   Procedure: TRANSESOPHAGEAL ECHOCARDIOGRAM (TEE);  Surgeon: Donata Clay, Theron Arista, MD;  Location: [facility] OR;  Service: Open Heart Surgery;  Laterality: N/A;   TIBIA IM NAIL INSERTION Left 05/19/2018   Procedure: INTRAMEDULLARY (IM) NAIL TIBIAL;  Surgeon: Juanell Fairly, MD;  Location: [facility] ORS;  Service: Orthopedics;  Laterality: Left;    SOCIAL HISTORY: Social History   Socioeconomic History   Marital status: Married    Spouse name: Not on file   Number of children: 3   Years of education: 12   Highest education level: 12th grade  Occupational History   Occupation: Retired  Tobacco Use   Smoking status: Former    Years: 30.00    Types: Cigarettes    Quit date: 02/04/2000    Years since quitting: 21.8   Smokeless tobacco: Never  Vaping Use   Vaping Use: Never used  Substance and Sexual Activity   Alcohol use: No   Drug use: No   Sexual activity: Not Currently  Other Topics Concern   Not on file  Social History Narrative   Not on file   Social Determinants of Health   Financial Resource Strain: Low Risk  (05/18/2021)   Overall Financial Resource Strain (CARDIA)     Difficulty of Paying Living Expenses: Not hard at all  Food Insecurity: No Food Insecurity (05/18/2021)   Hunger Vital Sign    Worried About Running Out of Food in the Last Year: Never true    Ran Out of Food in the Last Year: Never true  Transportation Needs: No Transportation Needs (05/18/2021)   PRAPARE - Administrator, Civil Service (Medical): No    Lack of Transportation (Non-Medical): No  Physical Activity: Insufficiently Active (05/18/2021)   Exercise Vital Sign    Days of Exercise per Week: 2 days    Minutes of Exercise per Session: 20 min  Stress: No Stress Concern Present (05/18/2021)   [employer] of Occupational Health - Occupational Stress Questionnaire    Feeling of Stress : Only a little  Social Connections: Socially Integrated (05/18/2021)   Social Connection and Isolation Panel [NHANES]    Frequency of Communication with Friends and Family: More than three times a week    Frequency of Social Gatherings with Friends and Family: More than three times a week    Attends Religious Services: 1 to 4 times per year    Active Member of [employer] or Organizations: Yes  Attends Banker Meetings: 1 to 4 times per year    Marital Status: Married  Catering manager Violence: Not At Risk (05/18/2021)   Humiliation, Afraid, Rape, and Kick questionnaire    Fear of Current or Ex-Partner: No    Emotionally Abused: No    Physically Abused: No    Sexually Abused: No    FAMILY HISTORY: Family History  Problem Relation Age of Onset   Hypertension Mother    Parkinson's disease Mother    Diabetes Father    Diabetes Brother    Cancer Maternal Grandmother        unknown   Cancer Maternal Grandfather        unknown   Neuropathy Neg Hx     ALLERGIES:  is allergic to iodine and shellfish allergy.  MEDICATIONS:  Current Outpatient Medications  Medication Sig Dispense Refill   alendronate (FOSAMAX) 70 MG tablet Take 70 mg by mouth once a week. Take with  a full glass of water on an empty stomach.     ALPRAZolam (XANAX) 0.5 MG tablet Take 1 tablet (0.5 mg total) by mouth 2 (two) times daily. 60 tablet 0   Cholecalciferol (VITAMIN D3 PO) Take by mouth daily.     donepezil (ARICEPT) 10 MG tablet Take 1 tablet (10 mg total) by mouth at bedtime. 90 tablet 3   glucose blood (ONETOUCH ULTRA) test strip TEST BLOOD SUGAR THREE TIMES DAILY 100 strip 3   hydrOXYzine (ATARAX/VISTARIL) 25 MG tablet Take 25 mg by mouth 2 (two) times daily.      insulin glargine (LANTUS) 100 UNIT/ML injection Inject 25-35 Units into the skin See admin instructions. 35 units every morning and 25 units at bedtime     Insulin Syringe-Needle U-100 (INSULIN SYRINGE .5CC/31GX5/16") 31G X 5/16" 0.5 ML MISC USE 1 NEEDLE TWICE DAILY 100 each 6   JANUVIA 100 MG tablet TAKE 1 TABLET BY MOUTH EVERY MORNING 90 tablet 3   Lancets (ONETOUCH DELICA PLUS LANCET30G) MISC USE TO TEST BLOOD SUGAR TWICE DAILY 100 each 6   levocetirizine (XYZAL) 5 MG tablet TAKE 1 TABLET(5 MG) BY MOUTH EVERY EVENING 90 tablet 2   levothyroxine (SYNTHROID) 88 MCG tablet TAKE 1 TABLET BY MOUTH DAILY 90 tablet 3   Loperamide HCl (IMODIUM A-D PO) Take by mouth as needed.     Melatonin 10 MG TABS Take by mouth at bedtime.     metFORMIN (GLUCOPHAGE) 500 MG tablet Take 1 tablet (500 mg total) by mouth 2 (two) times daily. 180 tablet 3   rosuvastatin (CRESTOR) 10 MG tablet TAKE 1 TABLET BY MOUTH DAILY 90 tablet 3   sertraline (ZOLOFT) 100 MG tablet Take 1 tablet (100 mg total) by mouth 2 (two) times daily. 180 tablet 2   No current facility-administered medications for this visit.      PHYSICAL EXAMINATION:   Vitals:   12/10/21 1403  BP: 126/61  Pulse: 90  Temp: 97.9 F (36.6 C)  SpO2: 100%   Filed Weights   12/10/21 1403  Weight: 118 lb 12.8 oz (53.9 kg)    Physical Exam Vitals and nursing note reviewed.  HENT:     Head: Normocephalic and atraumatic.     Mouth/Throat:     Pharynx: Oropharynx is  clear.  Eyes:     Extraocular Movements: Extraocular movements intact.     Pupils: Pupils are equal, round, and reactive to light.  Cardiovascular:     Rate and Rhythm: Normal rate and regular rhythm.  Pulmonary:     Comments: Decreased breath sounds bilaterally.  Abdominal:     Palpations: Abdomen is soft.  Musculoskeletal:        General: Normal range of motion.     Cervical back: Normal range of motion.  Skin:    General: Skin is warm.  Neurological:     General: No focal deficit present.     Mental Status: She is alert and oriented to person, place, and time.  Psychiatric:        Behavior: Behavior normal.        Judgment: Judgment normal.     LABORATORY DATA:  I have reviewed the data as listed Lab Results  Component Value Date   WBC 5.3 12/10/2021   HGB 12.7 12/10/2021   HCT 38.8 12/10/2021   MCV 90.4 12/10/2021   PLT 85 (L) 12/10/2021   Recent Labs    12/10/21 1447  NA 136  K 3.8  CL 103  CO2 27  GLUCOSE 142*  BUN 16  CREATININE 0.89  CALCIUM 9.6  GFRNONAA >60  PROT 7.2  ALBUMIN 3.5  AST 33  ALT 19  ALKPHOS 63  BILITOT 0.4     No results found.  No results found for: "KPAFRELGTCHN", "LAMBDASER", "KAPLAMBRATIO"   Polyclonal gammopathy # POLYCLONAL GAMMOPATHY- [2022-NOV]-the etiology is unclear however suspect underlying inflammation especially given patient's joint pains/muscle pain.  Recommend reevaluation of gammopathy with-myeloma panel kappa lambda light chain ratio; LDH and also CBC; CMP.  Check CRP  #Weight loss unclear etiology lost about 6 pounds in the last few months.  Recommend chest x-ray; and also CT scan abdomen pelvis with contrast.  #Chronic mild to moderate thrombocytopenia-question ITP versus others.  Await above work-up.   # PN [id number]- [place]-neurology];  Mild dementia/congnitive- ?   # Joint pain/leg weakness- awaiting rheuamatology evaluation  Thank you Dr.Athar for allowing me to participate in the care of your  pleasant patient. Please do not hesitate to contact me with questions or concerns in the interim.  # DISPOSITION: # labs today-ordered.  # follow up in 2-3 weeks- MD; No labs- CT scan Abpelvis; CXR  PRIOR- Dr.B     All questions were answered. The patient knows to call the clinic with any problems, questions or concerns.      Earna Coder, MD 12/10/2021 10:33 PM

## 2021-12-11 ENCOUNTER — Encounter: Payer: Self-pay | Admitting: Internal Medicine

## 2021-12-11 ENCOUNTER — Ambulatory Visit (INDEPENDENT_AMBULATORY_CARE_PROVIDER_SITE_OTHER): Payer: Medicare Other | Admitting: Internal Medicine

## 2021-12-11 VITALS — BP 123/72 | HR 88 | Ht 59.0 in | Wt 118.2 lb

## 2021-12-11 DIAGNOSIS — E063 Autoimmune thyroiditis: Secondary | ICD-10-CM | POA: Diagnosis not present

## 2021-12-11 DIAGNOSIS — F419 Anxiety disorder, unspecified: Secondary | ICD-10-CM

## 2021-12-11 DIAGNOSIS — D890 Polyclonal hypergammaglobulinemia: Secondary | ICD-10-CM

## 2021-12-11 DIAGNOSIS — I10 Essential (primary) hypertension: Secondary | ICD-10-CM | POA: Diagnosis not present

## 2021-12-11 DIAGNOSIS — D649 Anemia, unspecified: Secondary | ICD-10-CM

## 2021-12-11 DIAGNOSIS — E118 Type 2 diabetes mellitus with unspecified complications: Secondary | ICD-10-CM | POA: Diagnosis not present

## 2021-12-11 DIAGNOSIS — K589 Irritable bowel syndrome without diarrhea: Secondary | ICD-10-CM

## 2021-12-11 DIAGNOSIS — E119 Type 2 diabetes mellitus without complications: Secondary | ICD-10-CM | POA: Diagnosis not present

## 2021-12-11 DIAGNOSIS — E038 Other specified hypothyroidism: Secondary | ICD-10-CM | POA: Diagnosis not present

## 2021-12-11 LAB — KAPPA/LAMBDA LIGHT CHAINS
Kappa free light chain: 73.6 mg/L — ABNORMAL HIGH (ref 3.3–19.4)
Kappa, lambda light chain ratio: 2.35 — ABNORMAL HIGH (ref 0.26–1.65)
Lambda free light chains: 31.3 mg/L — ABNORMAL HIGH (ref 5.7–26.3)

## 2021-12-11 LAB — GLUCOSE, POCT (MANUAL RESULT ENTRY): POC Glucose: 165 mg/dl — AB (ref 70–99)

## 2021-12-11 NOTE — Assessment & Plan Note (Signed)
Patient is being followed up by hematologist 

## 2021-12-11 NOTE — Progress Notes (Signed)
Established Patient Office Visit  Subjective:  Patient ID: Jenny [name], female    DOB: Mar 12, 1946  Age: 76 y.o. MRN: [id number]  CC:  Chief Complaint  Patient presents with   Anxiety    Patient is here for her medication refill on xanax.    Anxiety Symptoms include confusion and nervous/anxious behavior. Patient reports no suicidal ideas.      Jenny [name] patient was evaluated for only grown milligram myopathy she is undergoing myeloma panel kappa lambda light chain ratio LDH CBC for thrombocytopenia and C-reactive protein.  She has lost about 8 pounds of weight.  So she is going to get a chest x-ray done she will also get a CT of the abdomen and pelvis.  These tests were ordered by the hematologist.  She denies any chest pain or shortness of breath on exertion.  Past Medical History:  Diagnosis Date   Anxiety    Arthritis    CAD (coronary artery disease)    a. 01/2017 Cath: nonobs dzs.   Chronic systolic CHF (congestive heart failure) ([place])    a. TTE 12/2016, EF 45-50%, probable hypokinesis of the mid apical anteroseptal, anterior, & apical myocardium, GR2DD, possibly bicuspid aortic valve that was moderately thickened w/ severely calcified leaflets, severe aortic stenosis w/ mean gradient 47 mmHg, valve area 0.52 cm, mild MR, mildly dilated LA   Depression    Diabetes mellitus with complication (HCC)    Tylenol   Hyperlipidemia    Hypothyroidism    IBS (irritable bowel syndrome)    Iron deficiency anemia    a. s/p pRBC x1 in 12/2016   Microcytic anemia    Osteoporosis    Panic attacks    Severe aortic stenosis    a. TTE 12/2016: possibly bicuspid aortic valve that was moderately thickened with severely calcified leaflets. There was severe aortic stenosis with a mean gradient of 47 mmHg and valve area of 0.52 cm; b. 01/2017 s/p AVR w/ 19 mm bioprosthetic Magna Ease pericardial tissue valve, ser# [id number].   Stroke [facility]) 2001   TIA (transient ischemic attack) 2006     Past Surgical History:  Procedure Laterality Date   ABDOMINAL HYSTERECTOMY     AORTIC VALVE REPLACEMENT N/A 02/17/2017   Procedure: AORTIC VALVE REPLACEMENT (AVR);  Surgeon: Prescott Gum, Collier Salina, MD;  Location: [place];  Service: Open Heart Surgery;  Laterality: N/A;   CATARACT EXTRACTION W/PHACO Right 04/09/2016   Procedure: CATARACT EXTRACTION PHACO AND INTRAOCULAR LENS PLACEMENT ([place]);  Surgeon: Birder Robson, MD;  Location: [facility];  Service: Ophthalmology;  Laterality: Right;  [place] 57.2 AP% 18.6 CDE 10.64 Fluid Pack lot # [id number]   CATARACT EXTRACTION W/PHACO Left 12/29/2018   Procedure: CATARACT EXTRACTION PHACO AND INTRAOCULAR LENS PLACEMENT ([place])  LEFT DIABETIC;  Surgeon: Birder Robson, MD;  Location: [place];  Service: Ophthalmology;  Laterality: Left;  Diabetic - insuli and oral meds   CHOLECYSTECTOMY     COLONOSCOPY     CORONARY ANGIOGRAPHY N/A 02/03/2017   Procedure: CORONARY ANGIOGRAPHY;  Surgeon: Wellington Hampshire, MD;  Location: [place] [facility];  Service: Cardiovascular;  Laterality: N/A;   FRACTURE SURGERY     LEFT ANKLE   RIGHT AND LEFT HEART CATH Bilateral 02/03/2017   Procedure: Right and Left Heart Cath;  Surgeon: Wellington Hampshire, MD;  Location: [place] [facility];  Service: Cardiovascular;  Laterality: Bilateral;   TEE WITHOUT CARDIOVERSION N/A 02/17/2017   Procedure: TRANSESOPHAGEAL ECHOCARDIOGRAM (TEE);  Surgeon: Prescott Gum,  Collier Salina, MD;  Location: [place];  Service: Open Heart Surgery;  Laterality: N/A;   TIBIA IM NAIL INSERTION Left 05/19/2018   Procedure: INTRAMEDULLARY (IM) NAIL TIBIAL;  Surgeon: Thornton Park, MD;  Location: [facility];  Service: Orthopedics;  Laterality: Left;    Family History  Problem Relation Age of Onset   Hypertension Mother    Parkinson's disease Mother    Diabetes Father    Diabetes Brother    Cancer Maternal Grandmother        unknown   Cancer Maternal Grandfather        unknown   Neuropathy Neg Hx      Social History   Socioeconomic History   Marital status: Married    Spouse name: Not on file   Number of children: 3   Years of education: 12   Highest education level: 12th grade  Occupational History   Occupation: Retired  Tobacco Use   Smoking status: Former    Years: 30.00    Types: Cigarettes    Quit date: 02/04/2000    Years since quitting: 21.8   Smokeless tobacco: Never  Vaping Use   Vaping Use: Never used  Substance and Sexual Activity   Alcohol use: No   Drug use: No   Sexual activity: Not Currently  Other Topics Concern   Not on file  Social History Narrative   Not on file   Social Determinants of Health   Financial Resource Strain: [place]  (05/18/2021)   Overall Financial Resource Strain (CARDIA)    Difficulty of Paying Living Expenses: Not hard at all  Food Insecurity: No Food Insecurity (05/18/2021)   Hunger Vital Sign    Worried About Running Out of Food in the Last Year: Never true    [place] in the Last Year: Never true  Transportation Needs: No Transportation Needs (05/18/2021)   PRAPARE - Hydrologist (Medical): No    Lack of Transportation (Non-Medical): No  Physical Activity: Insufficiently Active (05/18/2021)   Exercise Vital Sign    Days of Exercise per Week: 2 days    Minutes of Exercise per Session: 20 min  Stress: No Stress Concern Present (05/18/2021)   [place]    Feeling of Stress : Only a little  Social Connections: Socially Integrated (05/18/2021)   Social Connection and Isolation Panel [NHANES]    Frequency of Communication with Friends and Family: More than three times a week    Frequency of Social Gatherings with Friends and Family: More than three times a week    Attends Religious Services: 1 to 4 times per year    Active Member of [employer] or Organizations: Yes    Attends Archivist Meetings: 1 to 4 times per  year    Marital Status: Married  Human resources officer Violence: Not At Risk (05/18/2021)   Humiliation, Afraid, Rape, and Kick questionnaire    Fear of Current or Ex-Partner: No    Emotionally Abused: No    Physically Abused: No    Sexually Abused: No     Current Outpatient Medications:    alendronate (FOSAMAX) 70 MG tablet, Take 70 mg by mouth once a week. Take with a full glass of water on an empty stomach., Disp: , Rfl:    ALPRAZolam (XANAX) 0.5 MG tablet, Take 1 tablet (0.5 mg total) by mouth 2 (two) times daily., Disp: 60 tablet, Rfl: 0   Cholecalciferol (  VITAMIN D3 PO), Take by mouth daily., Disp: , Rfl:    donepezil (ARICEPT) 10 MG tablet, Take 1 tablet (10 mg total) by mouth at bedtime., Disp: 90 tablet, Rfl: 3   glucose blood (ONETOUCH ULTRA) test strip, TEST BLOOD SUGAR THREE TIMES DAILY, Disp: 100 strip, Rfl: 3   hydrOXYzine (ATARAX/VISTARIL) 25 MG tablet, Take 25 mg by mouth 2 (two) times daily. , Disp: , Rfl:    insulin glargine (LANTUS) 100 UNIT/ML injection, Inject 25-35 Units into the skin See admin instructions. 35 units every morning and 25 units at bedtime, Disp: , Rfl:    Insulin Syringe-Needle U-100 (INSULIN SYRINGE .5CC/31GX5/16") 31G X 5/16" 0.5 ML MISC, USE 1 NEEDLE TWICE DAILY, Disp: 100 each, Rfl: 6   JANUVIA 100 MG tablet, TAKE 1 TABLET BY MOUTH EVERY MORNING, Disp: 90 tablet, Rfl: 3   Lancets (ONETOUCH DELICA PLUS Q000111Q) MISC, USE TO TEST BLOOD SUGAR TWICE DAILY, Disp: 100 each, Rfl: 6   levocetirizine (XYZAL) 5 MG tablet, TAKE 1 TABLET(5 MG) BY MOUTH EVERY EVENING, Disp: 90 tablet, Rfl: 2   levothyroxine (SYNTHROID) 88 MCG tablet, TAKE 1 TABLET BY MOUTH DAILY, Disp: 90 tablet, Rfl: 3   Loperamide HCl (IMODIUM A-D PO), Take by mouth as needed., Disp: , Rfl:    Melatonin 10 MG TABS, Take by mouth at bedtime., Disp: , Rfl:    metFORMIN (GLUCOPHAGE) 500 MG tablet, Take 1 tablet (500 mg total) by mouth 2 (two) times daily., Disp: 180 tablet, Rfl: 3   rosuvastatin  (CRESTOR) 10 MG tablet, TAKE 1 TABLET BY MOUTH DAILY, Disp: 90 tablet, Rfl: 3   sertraline (ZOLOFT) 100 MG tablet, Take 1 tablet (100 mg total) by mouth 2 (two) times daily., Disp: 180 tablet, Rfl: 2   Allergies  Allergen Reactions   Iodine Swelling    ANGIOEDEMA FACIAL SWELLING "BETADINE OKAY"   Shellfish Allergy Anaphylaxis    ROS Review of Systems  Constitutional: Negative.   HENT: Negative.    Eyes: Negative.   Respiratory: Negative.    Cardiovascular: Negative.   Gastrointestinal: Negative.   Endocrine: Negative.   Genitourinary: Negative.   Musculoskeletal: Negative.   Skin: Negative.   Allergic/Immunologic: Negative.   Neurological: Negative.   Hematological: Negative.        Patient is anemic, also has history of loss of weight, her other problems include anxiety  Psychiatric/Behavioral:  Positive for confusion. Negative for hallucinations and suicidal ideas. The patient is nervous/anxious.   All other systems reviewed and are negative.     Objective:    Physical Exam Vitals reviewed.  Constitutional:      Appearance: Normal appearance.  HENT:     Mouth/Throat:     Mouth: Mucous membranes are moist.  Eyes:     Pupils: Pupils are equal, round, and reactive to light.  Neck:     Vascular: No carotid bruit.  Cardiovascular:     Rate and Rhythm: Normal rate and regular rhythm.     Pulses: Normal pulses.     Heart sounds: Normal heart sounds.  Pulmonary:     Effort: Pulmonary effort is normal.     Breath sounds: Normal breath sounds.  Abdominal:     General: Bowel sounds are normal.     Palpations: Abdomen is soft. There is no hepatomegaly, splenomegaly or mass.     Tenderness: There is no abdominal tenderness.     Hernia: No hernia is present.  Musculoskeletal:        General: No  tenderness.     Cervical back: Neck supple.     Right lower leg: No edema.     Left lower leg: No edema.  Skin:    Findings: No rash.  Neurological:     Mental Status: She  is alert and oriented to person, place, and time.     Motor: No weakness.  Psychiatric:        Mood and Affect: Mood and affect normal.        Behavior: Behavior normal.     BP 123/72   Pulse 88   Ht 4\' 11"  (1.499 m)   Wt 118 lb 3.2 oz (53.6 kg)   BMI 23.87 kg/m  Wt Readings from Last 3 Encounters:  12/11/21 118 lb 3.2 oz (53.6 kg)  12/10/21 118 lb 12.8 oz (53.9 kg)  11/22/21 122 lb 9.6 oz (55.6 kg)     Health Maintenance Due  Topic Date Due   FOOT EXAM  Never done   OPHTHALMOLOGY EXAM  Never done   Hepatitis C Screening  Never done   MAMMOGRAM  Never done   Zoster Vaccines- Shingrix (2 of 2) 04/07/2013   COVID-19 Vaccine (4 - Pfizer series) 07/19/2020   HEMOGLOBIN A1C  08/22/2021    There are no preventive care reminders to display for this patient.  Lab Results  Component Value Date   TSH 2.450 05/23/2020   Lab Results  Component Value Date   WBC 5.3 12/10/2021   HGB 12.7 12/10/2021   HCT 38.8 12/10/2021   MCV 90.4 12/10/2021   PLT 85 (L) 12/10/2021   Lab Results  Component Value Date   NA 136 12/10/2021   K 3.8 12/10/2021   CO2 27 12/10/2021   GLUCOSE 142 (H) 12/10/2021   BUN 16 12/10/2021   CREATININE 0.89 12/10/2021   BILITOT 0.4 12/10/2021   ALKPHOS 63 12/10/2021   AST 33 12/10/2021   ALT 19 12/10/2021   PROT 7.2 12/10/2021   ALBUMIN 3.5 12/10/2021   CALCIUM 9.6 12/10/2021   ANIONGAP 6 12/10/2021   Lab Results  Component Value Date   CHOL 92 01/15/2017   Lab Results  Component Value Date   HDL 36 (L) 01/15/2017   Lab Results  Component Value Date   LDLCALC 37 01/15/2017   Lab Results  Component Value Date   TRIG 97 01/15/2017   Lab Results  Component Value Date   CHOLHDL 2.6 01/15/2017   Lab Results  Component Value Date   HGBA1C 5.6 02/19/2021      Assessment & Plan:   Problem List Items Addressed This Visit       Cardiovascular and Mediastinum   Essential hypertension    Blood pressure is stable         Digestive   Irritable bowel syndrome    IBS is under control        Endocrine   Type 2 diabetes mellitus with complication, without long-term current use of insulin (HCC)    Under control      Hypothyroidism    Patient was advised to take thyroid medication first thing in the morning        Other   Anemia    Patient is being followed up by hematologist      Anxiety    Chronic problem      Polyclonal gammopathy    Refer to hematologist      Other Visit Diagnoses     Type 2 diabetes mellitus without complication, without  long-term current use of insulin (HCC)    -  Primary   Relevant Orders   POCT glucose (manual entry) (Completed)       No orders of the defined types were placed in this encounter.   Follow-up: No follow-ups on file.    Cletis Athens, MD

## 2021-12-11 NOTE — Assessment & Plan Note (Signed)
Patient was advised to take thyroid medication first thing in the morning 

## 2021-12-11 NOTE — Assessment & Plan Note (Signed)
Blood pressure is stable 

## 2021-12-11 NOTE — Assessment & Plan Note (Signed)
Chronic problem. 

## 2021-12-11 NOTE — Assessment & Plan Note (Signed)
Under control 

## 2021-12-11 NOTE — Assessment & Plan Note (Signed)
Refer to hematologist °

## 2021-12-11 NOTE — Assessment & Plan Note (Signed)
IBS is under control

## 2021-12-13 LAB — MULTIPLE MYELOMA PANEL, SERUM
Albumin SerPl Elph-Mcnc: 3.6 g/dL (ref 2.9–4.4)
Albumin/Glob SerPl: 1.3 (ref 0.7–1.7)
Alpha 1: 0.2 g/dL (ref 0.0–0.4)
Alpha2 Glob SerPl Elph-Mcnc: 0.6 g/dL (ref 0.4–1.0)
B-Globulin SerPl Elph-Mcnc: 1 g/dL (ref 0.7–1.3)
Gamma Glob SerPl Elph-Mcnc: 1.1 g/dL (ref 0.4–1.8)
Globulin, Total: 2.9 g/dL (ref 2.2–3.9)
IgA: 372 mg/dL (ref 64–422)
IgG (Immunoglobin G), Serum: 1201 mg/dL (ref 586–1602)
IgM (Immunoglobulin M), Srm: 147 mg/dL (ref 26–217)
Total Protein ELP: 6.5 g/dL (ref 6.0–8.5)

## 2021-12-24 ENCOUNTER — Telehealth: Payer: Self-pay

## 2021-12-25 ENCOUNTER — Ambulatory Visit
Admission: RE | Admit: 2021-12-25 | Discharge: 2021-12-25 | Disposition: A | Discharge status: Home / Self Care | Payer: Medicare Other | Source: Ambulatory Visit | Attending: Internal Medicine | Admitting: Internal Medicine

## 2021-12-25 DIAGNOSIS — Z9049 Acquired absence of other specified parts of digestive tract: Secondary | ICD-10-CM | POA: Diagnosis not present

## 2021-12-25 DIAGNOSIS — R634 Abnormal weight loss: Secondary | ICD-10-CM | POA: Diagnosis not present

## 2021-12-25 DIAGNOSIS — I700 Atherosclerosis of aorta: Secondary | ICD-10-CM | POA: Diagnosis not present

## 2021-12-25 DIAGNOSIS — K5730 Diverticulosis of large intestine without perforation or abscess without bleeding: Secondary | ICD-10-CM | POA: Diagnosis not present

## 2021-12-25 MED ORDER — IOHEXOL 300 MG/ML  SOLN
100.0000 mL | Freq: Once | INTRAMUSCULAR | Status: AC | PRN
  Administered 2021-12-25: 100 mL via INTRAVENOUS

## 2022-01-04 ENCOUNTER — Inpatient Hospital Stay: Payer: Medicare Other | Admitting: Internal Medicine

## 2022-01-14 ENCOUNTER — Ambulatory Visit (INDEPENDENT_AMBULATORY_CARE_PROVIDER_SITE_OTHER): Payer: Medicare Other | Admitting: Internal Medicine

## 2022-01-14 ENCOUNTER — Encounter: Payer: Self-pay | Admitting: Internal Medicine

## 2022-01-14 VITALS — BP 116/72 | HR 77 | Ht 59.0 in | Wt 117.7 lb

## 2022-01-14 DIAGNOSIS — F419 Anxiety disorder, unspecified: Secondary | ICD-10-CM

## 2022-01-14 DIAGNOSIS — E119 Type 2 diabetes mellitus without complications: Secondary | ICD-10-CM | POA: Diagnosis not present

## 2022-01-14 DIAGNOSIS — E118 Type 2 diabetes mellitus with unspecified complications: Secondary | ICD-10-CM | POA: Diagnosis not present

## 2022-01-14 DIAGNOSIS — E038 Other specified hypothyroidism: Secondary | ICD-10-CM

## 2022-01-14 DIAGNOSIS — E063 Autoimmune thyroiditis: Secondary | ICD-10-CM | POA: Diagnosis not present

## 2022-01-14 DIAGNOSIS — K589 Irritable bowel syndrome without diarrhea: Secondary | ICD-10-CM

## 2022-01-14 DIAGNOSIS — I10 Essential (primary) hypertension: Secondary | ICD-10-CM

## 2022-01-14 DIAGNOSIS — G6289 Other specified polyneuropathies: Secondary | ICD-10-CM | POA: Diagnosis not present

## 2022-01-14 LAB — GLUCOSE, POCT (MANUAL RESULT ENTRY): POC Glucose: 98 mg/dl (ref 70–99)

## 2022-01-14 MED ORDER — ALPRAZOLAM 0.5 MG PO TABS: 0.5000 mg | ORAL_TABLET | Freq: Two times a day (BID) | ORAL | 0 refills | Status: DC

## 2022-01-14 NOTE — Progress Notes (Signed)
Established Patient Office Visit  Subjective:  Patient ID: Jenny [name], female    DOB: 05/01/1946  Age: 76 y.o. MRN: [id number]  CC:  Chief Complaint  Patient presents with   Diabetes   Medication Refill    Diabetes  Medication Refill    Jenny [name] presents for check up  Past Medical History:  Diagnosis Date   Anxiety    Arthritis    CAD (coronary artery disease)    a. 01/2017 Cath: nonobs dzs.   Chronic systolic CHF (congestive heart failure) (HCC)    a. TTE 12/2016, EF 45-50%, probable hypokinesis of the mid apical anteroseptal, anterior, & apical myocardium, GR2DD, possibly bicuspid aortic valve that was moderately thickened w/ severely calcified leaflets, severe aortic stenosis w/ mean gradient 47 mmHg, valve area 0.52 cm, mild MR, mildly dilated LA   Depression    Diabetes mellitus with complication (HCC)    Tylenol   Hyperlipidemia    Hypothyroidism    IBS (irritable bowel syndrome)    Iron deficiency anemia    a. s/p pRBC x1 in 12/2016   Microcytic anemia    Osteoporosis    Panic attacks    Severe aortic stenosis    a. TTE 12/2016: possibly bicuspid aortic valve that was moderately thickened with severely calcified leaflets. There was severe aortic stenosis with a mean gradient of 47 mmHg and valve area of 0.52 cm; b. 01/2017 s/p AVR w/ 19 mm bioprosthetic Magna Ease pericardial tissue valve, ser# [id number].   Stroke [facility]) 2001   TIA (transient ischemic attack) 2006    Past Surgical History:  Procedure Laterality Date   ABDOMINAL HYSTERECTOMY     AORTIC VALVE REPLACEMENT N/A 02/17/2017   Procedure: AORTIC VALVE REPLACEMENT (AVR);  Surgeon: Donata Clay, Theron Arista, MD;  Location: [facility] OR;  Service: Open Heart Surgery;  Laterality: N/A;   CATARACT EXTRACTION W/PHACO Right 04/09/2016   Procedure: CATARACT EXTRACTION PHACO AND INTRAOCULAR LENS PLACEMENT (IOC);  Surgeon: Galen Manila, MD;  Location: [facility] ORS;  Service: Ophthalmology;  Laterality: Right;  [place]  57.2 AP% 18.6 CDE 10.64 Fluid Pack lot # [id number]   CATARACT EXTRACTION W/PHACO Left 12/29/2018   Procedure: CATARACT EXTRACTION PHACO AND INTRAOCULAR LENS PLACEMENT (IOC)  LEFT DIABETIC;  Surgeon: Galen Manila, MD;  Location[facility];  Service: Ophthalmology;  Laterality: Left;  Diabetic - insuli and oral meds   CHOLECYSTECTOMY     COLONOSCOPY     CORONARY ANGIOGRAPHY N/A 02/03/2017   Procedure: CORONARY ANGIOGRAPHY;  Surgeon: Iran Ouch, MD;  Location: [facility] INVASIVE CV LAB;  Service: Cardiovascular;  Laterality: N/A;   FRACTURE SURGERY     LEFT ANKLE   RIGHT AND LEFT HEART CATH Bilateral 02/03/2017   Procedure: Right and Left Heart Cath;  Surgeon: Iran Ouch, MD;  Location: [facility] INVASIVE CV LAB;  Service: Cardiovascular;  Laterality: Bilateral;   TEE WITHOUT CARDIOVERSION N/A 02/17/2017   Procedure: TRANSESOPHAGEAL ECHOCARDIOGRAM (TEE);  Surgeon: Donata Clay, Theron Arista, MD;  Location: [facility] OR;  Service: Open Heart Surgery;  Laterality: N/A;   TIBIA IM NAIL INSERTION Left 05/19/2018   Procedure: INTRAMEDULLARY (IM) NAIL TIBIAL;  Surgeon: Juanell Fairly, MD;  Location: [facility] ORS;  Service: Orthopedics;  Laterality: Left;    Family History  Problem Relation Age of Onset   Hypertension Mother    Parkinson's disease Mother    Diabetes Father    Diabetes Brother    Cancer Maternal Grandmother        unknown  Cancer Maternal Grandfather        unknown   Neuropathy Neg Hx     Social History   Socioeconomic History   Marital status: Married    Spouse name: Not on file   Number of children: 3   Years of education: 12   Highest education level: 12th grade  Occupational History   Occupation: Retired  Tobacco Use   Smoking status: Former    Years: 30.00    Types: Cigarettes    Quit date: 02/04/2000    Years since quitting: 21.9   Smokeless tobacco: Never  Vaping Use   Vaping Use: Never used  Substance and Sexual Activity   Alcohol use: No   Drug use: No    Sexual activity: Not Currently  Other Topics Concern   Not on file  Social History Narrative   Not on file   Social Determinants of Health   Financial Resource Strain: Low Risk  (05/18/2021)   Overall Financial Resource Strain (CARDIA)    Difficulty of Paying Living Expenses: Not hard at all  Food Insecurity: No Food Insecurity (05/18/2021)   Hunger Vital Sign    Worried About Running Out of Food in the Last Year: Never true    Ran Out of Food in the Last Year: Never true  Transportation Needs: No Transportation Needs (05/18/2021)   PRAPARE - Administrator, Civil Service (Medical): No    Lack of Transportation (Non-Medical): No  Physical Activity: Insufficiently Active (05/18/2021)   Exercise Vital Sign    Days of Exercise per Week: 2 days    Minutes of Exercise per Session: 20 min  Stress: No Stress Concern Present (05/18/2021)   [employer] of Occupational Health - Occupational Stress Questionnaire    Feeling of Stress : Only a little  Social Connections: Socially Integrated (05/18/2021)   Social Connection and Isolation Panel [NHANES]    Frequency of Communication with Friends and Family: More than three times a week    Frequency of Social Gatherings with Friends and Family: More than three times a week    Attends Religious Services: 1 to 4 times per year    Active Member of [employer] or Organizations: Yes    Attends Banker Meetings: 1 to 4 times per year    Marital Status: Married  Catering manager Violence: Not At Risk (05/18/2021)   Humiliation, Afraid, Rape, and Kick questionnaire    Fear of Current or Ex-Partner: No    Emotionally Abused: No    Physically Abused: No    Sexually Abused: No     Current Outpatient Medications:    alendronate (FOSAMAX) 70 MG tablet, Take 70 mg by mouth once a week. Take with a full glass of water on an empty stomach., Disp: , Rfl:    Cholecalciferol (VITAMIN D3 PO), Take by mouth daily., Disp: , Rfl:     donepezil (ARICEPT) 10 MG tablet, Take 1 tablet (10 mg total) by mouth at bedtime., Disp: 90 tablet, Rfl: 3   glucose blood (ONETOUCH ULTRA) test strip, TEST BLOOD SUGAR THREE TIMES DAILY, Disp: 100 strip, Rfl: 3   hydrOXYzine (ATARAX/VISTARIL) 25 MG tablet, Take 25 mg by mouth 2 (two) times daily. , Disp: , Rfl:    insulin glargine (LANTUS) 100 UNIT/ML injection, Inject 25-35 Units into the skin See admin instructions. 35 units every morning and 25 units at bedtime, Disp: , Rfl:    Insulin Syringe-Needle U-100 (INSULIN SYRINGE .5CC/31GX5/16") 31G X 5/16" 0.5  ML MISC, USE 1 NEEDLE TWICE DAILY, Disp: 100 each, Rfl: 6   JANUVIA 100 MG tablet, TAKE 1 TABLET BY MOUTH EVERY MORNING, Disp: 90 tablet, Rfl: 3   Lancets (ONETOUCH DELICA PLUS LANCET30G) MISC, USE TO TEST BLOOD SUGAR TWICE DAILY, Disp: 100 each, Rfl: 6   levocetirizine (XYZAL) 5 MG tablet, TAKE 1 TABLET(5 MG) BY MOUTH EVERY EVENING, Disp: 90 tablet, Rfl: 2   levothyroxine (SYNTHROID) 88 MCG tablet, TAKE 1 TABLET BY MOUTH DAILY, Disp: 90 tablet, Rfl: 3   Loperamide HCl (IMODIUM A-D PO), Take by mouth as needed., Disp: , Rfl:    Melatonin 10 MG TABS, Take by mouth at bedtime., Disp: , Rfl:    metFORMIN (GLUCOPHAGE) 500 MG tablet, Take 1 tablet (500 mg total) by mouth 2 (two) times daily., Disp: 180 tablet, Rfl: 3   rosuvastatin (CRESTOR) 10 MG tablet, TAKE 1 TABLET BY MOUTH DAILY, Disp: 90 tablet, Rfl: 3   sertraline (ZOLOFT) 100 MG tablet, Take 1 tablet (100 mg total) by mouth 2 (two) times daily., Disp: 180 tablet, Rfl: 2   ALPRAZolam (XANAX) 0.5 MG tablet, Take 1 tablet (0.5 mg total) by mouth 2 (two) times daily., Disp: 60 tablet, Rfl: 0   Allergies  Allergen Reactions   Iodine Swelling    ANGIOEDEMA FACIAL SWELLING "BETADINE OKAY"   Shellfish Allergy Anaphylaxis    ROS Review of Systems  Constitutional: Negative.   HENT: Negative.    Eyes: Negative.   Respiratory: Negative.    Cardiovascular: Negative.   Gastrointestinal:  Negative.   Endocrine: Negative.   Genitourinary: Negative.   Musculoskeletal: Negative.   Skin: Negative.   Allergic/Immunologic: Negative.   Neurological: Negative.   Hematological: Negative.   Psychiatric/Behavioral: Negative.    All other systems reviewed and are negative.     Objective:    Physical Exam Vitals reviewed.  Constitutional:      Appearance: Normal appearance.  HENT:     Mouth/Throat:     Mouth: Mucous membranes are moist.  Eyes:     Pupils: Pupils are equal, round, and reactive to light.  Neck:     Vascular: No carotid bruit.  Cardiovascular:     Rate and Rhythm: Normal rate and regular rhythm.     Pulses: Normal pulses.     Heart sounds: Normal heart sounds.  Pulmonary:     Effort: Pulmonary effort is normal.     Breath sounds: Normal breath sounds.  Abdominal:     General: Bowel sounds are normal.     Palpations: Abdomen is soft. There is no hepatomegaly, splenomegaly or mass.     Tenderness: There is no abdominal tenderness.     Hernia: No hernia is present.  Musculoskeletal:        General: No tenderness.     Cervical back: Neck supple.     Right lower leg: No edema.     Left lower leg: No edema.  Skin:    Findings: No rash.  Neurological:     Mental Status: She is alert and oriented to person, place, and time.     Motor: No weakness.  Psychiatric:        Mood and Affect: Mood and affect normal.        Behavior: Behavior normal.     BP 116/72   Pulse 77   Ht 4\' 11"  (1.499 m)   Wt 117 lb 11.2 oz (53.4 kg)   BMI 23.77 kg/m  Wt Readings from Last 3 Encounters:  01/14/22 117 lb 11.2 oz (53.4 kg)  12/11/21 118 lb 3.2 oz (53.6 kg)  12/10/21 118 lb 12.8 oz (53.9 kg)     Health Maintenance Due  Topic Date Due   FOOT EXAM  Never done   OPHTHALMOLOGY EXAM  Never done   Hepatitis C Screening  Never done   MAMMOGRAM  Never done   Zoster Vaccines- Shingrix (2 of 2) 04/07/2013   COVID-19 Vaccine (4 - Pfizer series) 07/19/2020    HEMOGLOBIN A1C  08/22/2021    There are no preventive care reminders to display for this patient.  Lab Results  Component Value Date   TSH 2.450 05/23/2020   Lab Results  Component Value Date   WBC 5.3 12/10/2021   HGB 12.7 12/10/2021   HCT 38.8 12/10/2021   MCV 90.4 12/10/2021   PLT 85 (L) 12/10/2021   Lab Results  Component Value Date   NA 136 12/10/2021   K 3.8 12/10/2021   CO2 27 12/10/2021   GLUCOSE 142 (H) 12/10/2021   BUN 16 12/10/2021   CREATININE 0.89 12/10/2021   BILITOT 0.4 12/10/2021   ALKPHOS 63 12/10/2021   AST 33 12/10/2021   ALT 19 12/10/2021   PROT 7.2 12/10/2021   ALBUMIN 3.5 12/10/2021   CALCIUM 9.6 12/10/2021   ANIONGAP 6 12/10/2021   Lab Results  Component Value Date   CHOL 92 01/15/2017   Lab Results  Component Value Date   HDL 36 (L) 01/15/2017   Lab Results  Component Value Date   LDLCALC 37 01/15/2017   Lab Results  Component Value Date   TRIG 97 01/15/2017   Lab Results  Component Value Date   CHOLHDL 2.6 01/15/2017   Lab Results  Component Value Date   HGBA1C 5.6 02/19/2021      Assessment & Plan:   Problem List Items Addressed This Visit       Cardiovascular and Mediastinum   Essential hypertension     Patient denies any chest pain or shortness of breath there is no history of palpitation or paroxysmal nocturnal dyspnea   patient was advised to follow low-salt low-cholesterol diet    ideally I want to keep systolic blood pressure below 295 mmHg, patient was asked to check blood pressure one times a week and give me a report on that.  Patient will be follow-up in 3 months  or earlier as needed, patient will call me back for any change in the cardiovascular symptoms Patient was advised to buy a book from local bookstore concerning blood pressure and read several chapters  every day.  This will be supplemented by some of the material we will give him from the office.  Patient should also utilize other resources like  YouTube and Internet to learn more about the blood pressure and the diet.        Digestive   Irritable bowel syndrome    Stable at the present time        Endocrine   Type 2 diabetes mellitus with complication, without long-term current use of insulin (HCC)    - The patient's blood sugar is labile on med. - The patient will continue the current treatment regimen.  - I encouraged the patient to regularly check blood sugar.  - I encouraged the patient to monitor diet. I encouraged the patient to eat low-carb and low-sugar to help prevent blood sugar spikes.  - I encouraged the patient to continue following their prescribed treatment plan for diabetes - I  informed the patient to get help if blood sugar drops below /dL, or if suddenly have trouble thinking clearly or breathing.  Patient was advised to buy a book on diabetes from a local bookstore or from [place].  Patient should read 2 chapters every day to keep the motivation going, this is in addition to some of the materials we provided them from the office.  There are other resources on the Internet like YouTube and wilkipedia to get an education on the diabetes      Hypothyroidism    Stable        Nervous and Auditory   Axonal neuropathy    Chronic problem        Other   Anxiety     Keeping a stress/anxiety diary. This can help you learn what triggers your reaction and then learn ways to manage your response.  Thinking about how you react to certain situations. You may not be able to control everything, but you can control your response.  Making time for activities that help you relax and not feeling guilty about spending your time in this way.  Visual imagery and yoga can help you stay calm and relax.      Relevant Medications   ALPRAZolam (XANAX) 0.5 MG tablet   Other Visit Diagnoses     Type 2 diabetes mellitus without complication, without long-term current use of insulin (HCC)    -  Primary   Relevant Orders    POCT glucose (manual entry) (Completed)       Meds ordered this encounter  Medications   ALPRAZolam (XANAX) 0.5 MG tablet    Sig: Take 1 tablet (0.5 mg total) by mouth 2 (two) times daily.    Dispense:  60 tablet    Refill:  0  Patient also gives a history of fall at home she developed a knot in the back of the head, her neurological examination is unremarkable.  Patient was advised to keep the lights in the bathroom at night before sleeping and use a walker to walk.  Follow-up: No follow-ups on file.    Corky Downs, MD

## 2022-01-14 NOTE — Assessment & Plan Note (Signed)
   Keeping a stress/anxiety diary. This can help you learn what triggers your reaction and then learn ways to manage your response.  Thinking about how you react to certain situations. You may not be able to control everything, but you can control your response.  Making time for activities that help you relax and not feeling guilty about spending your time in this way.  Visual imagery and yoga can help you stay calm and relax. 

## 2022-01-14 NOTE — Assessment & Plan Note (Signed)
Stable at the present time. 

## 2022-01-14 NOTE — Assessment & Plan Note (Signed)

## 2022-01-14 NOTE — Assessment & Plan Note (Signed)
Chronic problem. 

## 2022-01-14 NOTE — Assessment & Plan Note (Signed)

## 2022-01-14 NOTE — Assessment & Plan Note (Signed)
Stable

## 2022-01-15 ENCOUNTER — Ambulatory Visit: Payer: Medicare Other | Admitting: Internal Medicine

## 2022-01-16 ENCOUNTER — Encounter: Payer: Self-pay | Admitting: Internal Medicine

## 2022-01-16 ENCOUNTER — Inpatient Hospital Stay: Payer: Medicare Other | Attending: Internal Medicine | Admitting: Internal Medicine

## 2022-01-16 VITALS — BP 131/59 | HR 77 | Temp 96.4°F | Ht 59.0 in | Wt 117.6 lb

## 2022-01-16 DIAGNOSIS — Z87891 Personal history of nicotine dependence: Secondary | ICD-10-CM | POA: Diagnosis not present

## 2022-01-16 DIAGNOSIS — D696 Thrombocytopenia, unspecified: Secondary | ICD-10-CM | POA: Diagnosis not present

## 2022-01-16 DIAGNOSIS — R911 Solitary pulmonary nodule: Secondary | ICD-10-CM | POA: Diagnosis not present

## 2022-01-16 DIAGNOSIS — G629 Polyneuropathy, unspecified: Secondary | ICD-10-CM | POA: Insufficient documentation

## 2022-01-16 DIAGNOSIS — D890 Polyclonal hypergammaglobulinemia: Secondary | ICD-10-CM | POA: Diagnosis not present

## 2022-01-16 DIAGNOSIS — Z79899 Other long term (current) drug therapy: Secondary | ICD-10-CM | POA: Diagnosis not present

## 2022-01-16 NOTE — Progress Notes (Signed)
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  #Question dementia; moderate to severe peripheral neuropathy;   HISTORY OF PRESENTING ILLNESS: walks with rolling walker; with husband. Poor historian.   Jenny [name] 76 y.o.  female with multiple medical problems including possible dementia neuropathy is here to follow-up the results of her work-up ordered at last visit.  Patient history of chronic neuropathy of the bilateral lower extremities.    Patient continues to complain of joint pains.  Otherwise denies any cough or shortness of breath.   Review of Systems  Constitutional:  Positive for malaise/fatigue and weight loss. Negative for chills, diaphoresis and fever.  HENT:  Negative for nosebleeds and sore throat.   Eyes:  Negative for double vision.  Respiratory:  Negative for cough, hemoptysis, sputum production, shortness of breath and wheezing.   Cardiovascular:  Negative for chest pain, palpitations, orthopnea and leg swelling.  Gastrointestinal:  Negative for abdominal pain, blood in stool, constipation, diarrhea, heartburn, melena, nausea and vomiting.  Genitourinary:  Negative for dysuria, frequency and urgency.  Musculoskeletal:  Positive for back pain and joint pain.  Skin: Negative.  Negative for itching and rash.  Neurological:  Positive for tingling. Negative for dizziness, focal weakness, weakness and headaches.  Endo/Heme/Allergies:  Does not bruise/bleed easily.   Psychiatric/Behavioral:  Negative for depression. The patient is not nervous/anxious and does not have insomnia.     MEDICAL HISTORY:  Past Medical History:  Diagnosis Date   Anxiety    Arthritis    CAD (coronary artery disease)    a. 01/2017 Cath: nonobs dzs.   Chronic systolic CHF (congestive heart failure) ([place])    a. TTE 12/2016, EF 45-50%, probable hypokinesis of the mid apical anteroseptal, anterior, & apical myocardium, GR2DD, possibly bicuspid aortic valve that was moderately thickened w/ severely calcified leaflets, severe aortic stenosis w/ mean gradient 47 mmHg, valve area 0.52 cm, mild MR, mildly dilated LA   Depression    Diabetes mellitus with complication (HCC)    Tylenol   Hyperlipidemia    Hypothyroidism    IBS (irritable bowel syndrome)    Iron deficiency anemia    a. s/p pRBC x1 in 12/2016   Microcytic anemia    Osteoporosis    Panic attacks    Severe aortic stenosis    a. TTE 12/2016: possibly bicuspid aortic valve that was moderately thickened with severely calcified leaflets. There was severe aortic stenosis with a mean gradient of 47 mmHg and valve area of 0.52 cm; b. 01/2017 s/p AVR w/ 19 mm bioprosthetic Magna Ease pericardial tissue valve, ser# [id number].   Stroke [facility]) 2001   TIA (transient ischemic attack) 2006    SURGICAL HISTORY: Past Surgical History:  Procedure Laterality Date   ABDOMINAL HYSTERECTOMY     AORTIC VALVE REPLACEMENT N/A 02/17/2017   Procedure: AORTIC VALVE REPLACEMENT (AVR);  Surgeon: Prescott Gum, Collier Salina, MD;  Location: [place];  Service: Open Heart Surgery;  Laterality: N/A;   CATARACT EXTRACTION W/PHACO Right 04/09/2016   Procedure: CATARACT EXTRACTION PHACO AND INTRAOCULAR LENS PLACEMENT ([place]);  Surgeon: Birder Robson, MD;  Location: [facility];  Service: Ophthalmology;  Laterality: Right;  [place] 57.2 AP% 18.6 CDE 10.64 Fluid Pack lot # [id number]   CATARACT EXTRACTION W/PHACO Left 12/29/2018   Procedure: CATARACT EXTRACTION PHACO AND  INTRAOCULAR LENS PLACEMENT ([place])  LEFT DIABETIC;  Surgeon: Birder Robson, MD;  Location: [place];  Service: Ophthalmology;  Laterality: Left;  Diabetic - insuli and oral meds   CHOLECYSTECTOMY     COLONOSCOPY     CORONARY ANGIOGRAPHY N/A 02/03/2017   Procedure: CORONARY ANGIOGRAPHY;  Surgeon: Wellington Hampshire, MD;  Location: [place] [facility];  Service: Cardiovascular;  Laterality: N/A;   FRACTURE SURGERY     LEFT ANKLE   RIGHT AND LEFT HEART CATH Bilateral 02/03/2017   Procedure: Right and Left Heart Cath;  Surgeon: Wellington Hampshire, MD;  Location: [place] [facility];  Service: Cardiovascular;  Laterality: Bilateral;   TEE WITHOUT CARDIOVERSION N/A 02/17/2017   Procedure: TRANSESOPHAGEAL ECHOCARDIOGRAM (TEE);  Surgeon: Prescott Gum, Collier Salina, MD;  Location: [place];  Service: Open Heart Surgery;  Laterality: N/A;   TIBIA IM NAIL INSERTION Left 05/19/2018   Procedure: INTRAMEDULLARY (IM) NAIL TIBIAL;  Surgeon: Thornton Park, MD;  Location: [facility];  Service: Orthopedics;  Laterality: Left;    SOCIAL HISTORY: Social History   Socioeconomic History   Marital status: Married    Spouse name: Not on file   Number of children: 3   Years of education: 12   Highest education level: 12th grade  Occupational History   Occupation: Retired  Tobacco Use   Smoking status: Former    Years: 30.00    Types: Cigarettes    Quit date: 02/04/2000    Years since quitting: 21.9   Smokeless tobacco: Never  Vaping Use   Vaping Use: Never used  Substance and Sexual Activity   Alcohol use: No   Drug use: No   Sexual activity: Not Currently  Other Topics Concern   Not on file  Social History Narrative   Not on file   Social Determinants of Health   Financial Resource Strain: Low Risk  (05/18/2021)   Overall Financial Resource Strain (CARDIA)    Difficulty of Paying Living Expenses: Not hard at all  Food Insecurity: No Food Insecurity (05/18/2021)   Hunger Vital Sign    Worried  About Running Out of Food in the Last Year: Never true    [place] in the Last Year: Never true  Transportation Needs: No Transportation Needs (05/18/2021)   PRAPARE - Hydrologist (Medical): No    Lack of Transportation (Non-Medical): No  Physical Activity: Insufficiently Active (05/18/2021)   Exercise Vital Sign    Days of Exercise per Week: 2 days    Minutes of Exercise per Session: 20 min  Stress: No Stress Concern Present (05/18/2021)   [place]    Feeling of Stress : Only a little  Social Connections: Socially Integrated (05/18/2021)   Social Connection and Isolation Panel [NHANES]    Frequency of Communication with Friends and Family: More than three times a week    Frequency of Social Gatherings with Friends and Family: More than three times a week    Attends Religious Services: 1 to 4 times per year    Active Member of [employer] or Organizations: Yes    Attends Archivist Meetings: 1 to 4 times per year    Marital Status: Married  Human resources officer Violence: Not At Risk (05/18/2021)  Humiliation, Afraid, Rape, and Kick questionnaire    Fear of Current or Ex-Partner: No    Emotionally Abused: No    Physically Abused: No    Sexually Abused: No    FAMILY HISTORY: Family History  Problem Relation Age of Onset   Hypertension Mother    Parkinson's disease Mother    Diabetes Father    Diabetes Brother    Cancer Maternal Grandmother        unknown   Cancer Maternal Grandfather        unknown   Neuropathy Neg Hx     ALLERGIES:  is allergic to iodine and shellfish allergy.  MEDICATIONS:  Current Outpatient Medications  Medication Sig Dispense Refill   alendronate (FOSAMAX) 70 MG tablet Take 70 mg by mouth once a week. Take with a full glass of water on an empty stomach.     ALPRAZolam (XANAX) 0.5 MG tablet Take 1 tablet (0.5 mg total) by mouth 2 (two) times  daily. 60 tablet 0   Cholecalciferol (VITAMIN D3 PO) Take by mouth daily.     donepezil (ARICEPT) 10 MG tablet Take 1 tablet (10 mg total) by mouth at bedtime. 90 tablet 3   glucose blood (ONETOUCH ULTRA) test strip TEST BLOOD SUGAR THREE TIMES DAILY 100 strip 3   hydrOXYzine (ATARAX/VISTARIL) 25 MG tablet Take 25 mg by mouth 2 (two) times daily.      insulin glargine (LANTUS) 100 UNIT/ML injection Inject 25-35 Units into the skin See admin instructions. 35 units every morning and 25 units at bedtime     Insulin Syringe-Needle U-100 (INSULIN SYRINGE .5CC/31GX5/16") 31G X 5/16" 0.5 ML MISC USE 1 NEEDLE TWICE DAILY 100 each 6   JANUVIA 100 MG tablet TAKE 1 TABLET BY MOUTH EVERY MORNING 90 tablet 3   Lancets (ONETOUCH DELICA PLUS [id number]) MISC USE TO TEST BLOOD SUGAR TWICE DAILY 100 each 6   levocetirizine (XYZAL) 5 MG tablet TAKE 1 TABLET(5 MG) BY MOUTH EVERY EVENING 90 tablet 2   levothyroxine (SYNTHROID) 88 MCG tablet TAKE 1 TABLET BY MOUTH DAILY 90 tablet 3   Loperamide HCl (IMODIUM A-D PO) Take by mouth as needed.     Melatonin 10 MG TABS Take by mouth at bedtime.     metFORMIN (GLUCOPHAGE) 500 MG tablet Take 1 tablet (500 mg total) by mouth 2 (two) times daily. 180 tablet 3   rosuvastatin (CRESTOR) 10 MG tablet TAKE 1 TABLET BY MOUTH DAILY 90 tablet 3   sertraline (ZOLOFT) 100 MG tablet Take 1 tablet (100 mg total) by mouth 2 (two) times daily. 180 tablet 2   No current facility-administered medications for this visit.      PHYSICAL EXAMINATION:   Vitals:   01/16/22 1526  BP: (!) 131/59  Pulse: 77  Temp: (!) 96.4 F (35.8 C)  SpO2: 100%   Filed Weights   01/16/22 1526  Weight: 117 lb 9.6 oz (53.3 kg)    Physical Exam Vitals and nursing note reviewed.  HENT:     Head: Normocephalic and atraumatic.     Mouth/Throat:     Pharynx: Oropharynx is clear.  Eyes:     Extraocular Movements: Extraocular movements intact.     Pupils: Pupils are equal, round, and reactive to  light.  Cardiovascular:     Rate and Rhythm: Normal rate and regular rhythm.  Pulmonary:     Comments: Decreased breath sounds bilaterally.  Abdominal:     Palpations: Abdomen is soft.  Musculoskeletal:  General: Normal range of motion.     Cervical back: Normal range of motion.  Skin:    General: Skin is warm.  Neurological:     General: No focal deficit present.     Mental Status: She is alert and oriented to person, place, and time.  Psychiatric:        Behavior: Behavior normal.        Judgment: Judgment normal.     LABORATORY DATA:  I have reviewed the data as listed Lab Results  Component Value Date   WBC 5.3 12/10/2021   HGB 12.7 12/10/2021   HCT 38.8 12/10/2021   MCV 90.4 12/10/2021   PLT 85 (L) 12/10/2021   Recent Labs    12/10/21 1447  NA 136  K 3.8  CL 103  CO2 27  GLUCOSE 142*  BUN 16  CREATININE 0.89  CALCIUM 9.6  GFRNONAA >60  PROT 7.2  ALBUMIN 3.5  AST 33  ALT 19  ALKPHOS 63  BILITOT 0.4     CT ABDOMEN PELVIS W CONTRAST  Result Date: 12/26/2021 CLINICAL DATA:  Unintentional weight loss. EXAM: CT ABDOMEN AND PELVIS WITH CONTRAST TECHNIQUE: Multidetector CT imaging of the abdomen and pelvis was performed using the standard protocol following bolus administration of intravenous contrast. RADIATION DOSE REDUCTION: This exam was performed according to the departmental dose-optimization program which includes automated exposure control, adjustment of the mA and/or kV according to patient size and/or use of iterative reconstruction technique. CONTRAST:  162m OMNIPAQUE IOHEXOL 300 MG/ML  SOLN COMPARISON:  None Available. FINDINGS: Lower Chest: No acute findings. Hepatobiliary: No hepatic masses identified. Prior cholecystectomy. No evidence of biliary obstruction. Pancreas:  No mass or inflammatory changes. Spleen: Within normal limits in size and appearance. Adrenals/Urinary Tract: No masses identified. No evidence of ureteral calculi or  hydronephrosis. Stomach/Bowel: No evidence of obstruction, inflammatory process or abnormal fluid collections. Diverticulosis is seen mainly involving the descending and sigmoid colon, however there is no evidence of diverticulitis. Vascular/Lymphatic: No pathologically enlarged lymph nodes. No acute vascular findings. Aortic atherosclerotic calcification and chronic portosystemic venous collaterals noted in the splenorenal ligament. Reproductive: Prior hysterectomy noted. Adnexal regions are unremarkable in appearance. Other:  None. Musculoskeletal:  No suspicious bone lesions identified. IMPRESSION: No acute findings.  No radiographic evidence of malignancy. Colonic diverticulosis, without radiographic evidence of diverticulitis. Aortic Atherosclerosis (ICD10-I70.0). Electronically Signed   By: JMarlaine HindM.D.   On: 12/26/2021 11:48   DG Chest 2 View  Result Date: 12/26/2021 CLINICAL DATA:  Weight loss. History of CAD, CHF, diabetes, stroke. Former smoker. EXAM: CHEST - 2 VIEW COMPARISON:  Chest x-ray 03/07/2019, 03/26/2017. FINDINGS: Prior median sternotomy and cardiac valve replacement. Heart size stable. Aortic atherosclerotic vascular calcification. Low lung volumes. Chronic interstitial changes are again noted. Focal area of increased density noted over the right upper lung. Although this may represent a small area of atelectasis and or infiltrate a focal lesion cannot be excluded and given the patient's history nonenhanced chest CT suggested for further evaluation. No pleural effusion or pneumothorax. Surgical clips right upper quadrant. Diffuse osteopenia and degenerative change thoracic spine. No acute bony abnormality. IMPRESSION: 1. Prior median sternotomy and cardiac valve replacement. Heart size stable. 2. Low lung volumes. Chronic interstitial changes are again noted. Focal area of increased density noted over the right upper lung. Although this may represent a small area of atelectasis and or  infiltrate a focal lesion cannot be excluded and given the patient's history nonenhanced chest CT suggested for  further evaluation. Electronically Signed   By: Marcello Moores  Register M.D.   On: 12/26/2021 11:16    Lab Results  Component Value Date   KPAFRELGTCHN 73.6 (H) 12/10/2021   LAMBDASER 31.3 (H) 12/10/2021   KAPLAMBRATIO 2.35 (H) 12/10/2021     Polyclonal gammopathy # POLYCLONAL GAMMOPATHY- [2022-NOV]-the etiology is unclear however suspect underlying inflammation especially given patient's joint pains/muscle pain.  July 2023-multiple myeloma panel negative; however kappa lambda light chain ratio slightly abnormal-2.35.  Monitor for now/repeat labs in 6 months.  #Weight loss unclear etiology lost about 6 pounds in the last few months.  July 2023 chest x-ray-chronic interstitial changes question opacity in the right upper lobe.  I would recommend CT scan of the chest.  July 2023 CT scan abdomen pelvis with contrast-negative for any acute process.  #Chronic mild to moderate thrombocytopenia-question ITP versus others.  CT abdomen/pelvis July 20-negative for cirrhosis/megaly.  # PN [id number]- [facility]-neurology];  Mild dementia/congnitive- ? -defer to Neurology, Dr.Athar.   # DISPOSITION: # CT chest in 1 month # follow up in  6 month  MD; labs- cbc/cmp; K/l light chains-   Dr.B  Dr.Athar; Dr.Masoud     All questions were answered. The patient knows to call the clinic with any problems, questions or concerns.      Cammie Sickle, MD 01/16/2022 3:59 PM

## 2022-01-16 NOTE — Assessment & Plan Note (Addendum)
#  POLYCLONAL GAMMOPATHY- [2022-NOV]-the etiology is unclear however suspect underlying inflammation especially given patient's joint pains/muscle pain.  July 2023-multiple myeloma panel negative; however kappa lambda light chain ratio slightly abnormal-2.35.  Monitor for now/repeat labs in 6 months.  #Weight loss unclear etiology lost about 6 pounds in the last few months.  July 2023 chest x-ray-chronic interstitial changes question opacity in the right upper lobe.  I would recommend CT scan of the chest.  July 2023 CT scan abdomen pelvis with contrast-negative for any acute process.  #Chronic mild to moderate thrombocytopenia-question ITP versus others.  CT abdomen/pelvis July 20-negative for cirrhosis/megaly.  # PN [id number]- [facility]-neurology];  Mild dementia/congnitive- ? -defer to Neurology, Dr.Athar.   # DISPOSITION: # CT chest in 1 month # follow up in  6 month  MD; labs- cbc/cmp; K/l light chains-   Dr.B  Dr.Athar; Dr.Masoud

## 2022-02-12 ENCOUNTER — Ambulatory Visit (INDEPENDENT_AMBULATORY_CARE_PROVIDER_SITE_OTHER): Payer: Medicare Other | Admitting: Internal Medicine

## 2022-02-12 ENCOUNTER — Encounter: Payer: Self-pay | Admitting: Internal Medicine

## 2022-02-12 VITALS — BP 137/70 | HR 90 | Ht 59.0 in | Wt 114.0 lb

## 2022-02-12 DIAGNOSIS — F419 Anxiety disorder, unspecified: Secondary | ICD-10-CM

## 2022-02-12 DIAGNOSIS — E118 Type 2 diabetes mellitus with unspecified complications: Secondary | ICD-10-CM

## 2022-02-12 DIAGNOSIS — D890 Polyclonal hypergammaglobulinemia: Secondary | ICD-10-CM | POA: Diagnosis not present

## 2022-02-12 DIAGNOSIS — K589 Irritable bowel syndrome without diarrhea: Secondary | ICD-10-CM

## 2022-02-12 DIAGNOSIS — I10 Essential (primary) hypertension: Secondary | ICD-10-CM

## 2022-02-12 DIAGNOSIS — E1169 Type 2 diabetes mellitus with other specified complication: Secondary | ICD-10-CM

## 2022-02-12 DIAGNOSIS — R413 Other amnesia: Secondary | ICD-10-CM | POA: Diagnosis not present

## 2022-02-12 DIAGNOSIS — E119 Type 2 diabetes mellitus without complications: Secondary | ICD-10-CM

## 2022-02-12 LAB — GLUCOSE, POCT (MANUAL RESULT ENTRY): POC Glucose: 148 mg/dl — AB (ref 70–99)

## 2022-02-12 MED ORDER — ALPRAZOLAM 0.5 MG PO TABS: 0.5000 mg | ORAL_TABLET | Freq: Two times a day (BID) | ORAL | 0 refills | Status: DC

## 2022-02-12 NOTE — Assessment & Plan Note (Signed)
Refer to oncology

## 2022-02-12 NOTE — Assessment & Plan Note (Signed)
Refer to neurology

## 2022-02-12 NOTE — Assessment & Plan Note (Signed)
Blood pressure stable at the present time 

## 2022-02-12 NOTE — Progress Notes (Signed)
Established Patient Office Visit  Subjective:  Patient ID: Jenny [name], female    DOB: [birth year]  Age: 76 y.o. MRN: [id number]  CC:  Chief Complaint  Patient presents with   Medication Refill    Medication Refill    Jenny [name] presents for check up Past Medical History:  Diagnosis Date   Anxiety    Arthritis    CAD (coronary artery disease)    a. 01/2017 Cath: nonobs dzs.   Chronic systolic CHF (congestive heart failure) (HCC)    a. TTE 12/2016, EF 45-50%, probable hypokinesis of the mid apical anteroseptal, anterior, & apical myocardium, GR2DD, possibly bicuspid aortic valve that was moderately thickened w/ severely calcified leaflets, severe aortic stenosis w/ mean gradient 47 mmHg, valve area 0.52 cm, mild MR, mildly dilated LA   Depression    Diabetes mellitus with complication (HCC)    Tylenol   Hyperlipidemia    Hypothyroidism    IBS (irritable bowel syndrome)    Iron deficiency anemia    a. s/p pRBC x1 in 12/2016   Microcytic anemia    Osteoporosis    Panic attacks    Severe aortic stenosis    a. TTE 12/2016: possibly bicuspid aortic valve that was moderately thickened with severely calcified leaflets. There was severe aortic stenosis with a mean gradient of 47 mmHg and valve area of 0.52 cm; b. 01/2017 s/p AVR w/ 19 mm bioprosthetic Magna Ease pericardial tissue valve, ser# [id number].   Stroke [facility]) 2001   TIA (transient ischemic attack) 2006    Past Surgical History:  Procedure Laterality Date   ABDOMINAL HYSTERECTOMY     AORTIC VALVE REPLACEMENT N/A 02/17/2017   Procedure: AORTIC VALVE REPLACEMENT (AVR);  Surgeon: Donata Clay, Theron Arista, MD;  Location: [facility] OR;  Service: Open Heart Surgery;  Laterality: N/A;   CATARACT EXTRACTION W/PHACO Right 04/09/2016   Procedure: CATARACT EXTRACTION PHACO AND INTRAOCULAR LENS PLACEMENT (IOC);  Surgeon: Galen Manila, MD;  Location: [facility] ORS;  Service: Ophthalmology;  Laterality: Right;  [place] 57.2 AP% 18.6 CDE  10.64 Fluid Pack lot # [id number]   CATARACT EXTRACTION W/PHACO Left 12/29/2018   Procedure: CATARACT EXTRACTION PHACO AND INTRAOCULAR LENS PLACEMENT (IOC)  LEFT DIABETIC;  Surgeon: Galen Manila, MD;  Location: [facility];  Service: Ophthalmology;  Laterality: Left;  Diabetic - insuli and oral meds   CHOLECYSTECTOMY     COLONOSCOPY     CORONARY ANGIOGRAPHY N/A 02/03/2017   Procedure: CORONARY ANGIOGRAPHY;  Surgeon: Iran Ouch, MD;  Location: [facility] INVASIVE CV LAB;  Service: Cardiovascular;  Laterality: N/A;   FRACTURE SURGERY     LEFT ANKLE   RIGHT AND LEFT HEART CATH Bilateral 02/03/2017   Procedure: Right and Left Heart Cath;  Surgeon: Iran Ouch, MD;  Location: [facility] INVASIVE CV LAB;  Service: Cardiovascular;  Laterality: Bilateral;   TEE WITHOUT CARDIOVERSION N/A 02/17/2017   Procedure: TRANSESOPHAGEAL ECHOCARDIOGRAM (TEE);  Surgeon: Donata Clay, Theron Arista, MD;  Location[facility] OR;  Service: Open Heart Surgery;  Laterality: N/A;   TIBIA IM NAIL INSERTION Left 05/19/2018   Procedure: INTRAMEDULLARY (IM) NAIL TIBIAL;  Surgeon: Juanell Fairly, MD;  Location: [facility] ORS;  Service: Orthopedics;  Laterality: Left;    Family History  Problem Relation Age of Onset   Hypertension Mother    Parkinson's disease Mother    Diabetes Father    Diabetes Brother    Cancer Maternal Grandmother        unknown   Cancer Maternal Grandfather  unknown   Neuropathy Neg Hx     Social History   Socioeconomic History   Marital status: Married    Spouse name: Not on file   Number of children: 3   Years of education: 12   Highest education level: 12th grade  Occupational History   Occupation: Retired  Tobacco Use   Smoking status: Former    Years: 30.00    Types: Cigarettes    Quit date: 02/04/2000    Years since quitting: 22.0   Smokeless tobacco: Never  Vaping Use   Vaping Use: Never used  Substance and Sexual Activity   Alcohol use: No   Drug use: No   Sexual activity: Not  Currently  Other Topics Concern   Not on file  Social History Narrative   Not on file   Social Determinants of Health   Financial Resource Strain: Low Risk  (05/18/2021)   Overall Financial Resource Strain (CARDIA)    Difficulty of Paying Living Expenses: Not hard at all  Food Insecurity: No Food Insecurity (05/18/2021)   Hunger Vital Sign    Worried About Running Out of Food in the Last Year: Never true    Ran Out of Food in the Last Year: Never true  Transportation Needs: No Transportation Needs (05/18/2021)   PRAPARE - Administrator, Civil Service (Medical): No    Lack of Transportation (Non-Medical): No  Physical Activity: Insufficiently Active (05/18/2021)   Exercise Vital Sign    Days of Exercise per Week: 2 days    Minutes of Exercise per Session: 20 min  Stress: No Stress Concern Present (05/18/2021)   [employer] of Occupational Health - Occupational Stress Questionnaire    Feeling of Stress : Only a little  Social Connections: Socially Integrated (05/18/2021)   Social Connection and Isolation Panel [NHANES]    Frequency of Communication with Friends and Family: More than three times a week    Frequency of Social Gatherings with Friends and Family: More than three times a week    Attends Religious Services: 1 to 4 times per year    Active Member of [employer] or Organizations: Yes    Attends Banker Meetings: 1 to 4 times per year    Marital Status: Married  Catering manager Violence: Not At Risk (05/18/2021)   Humiliation, Afraid, Rape, and Kick questionnaire    Fear of Current or Ex-Partner: No    Emotionally Abused: No    Physically Abused: No    Sexually Abused: No     Current Outpatient Medications:    alendronate (FOSAMAX) 70 MG tablet, Take 70 mg by mouth once a week. Take with a full glass of water on an empty stomach., Disp: , Rfl:    Cholecalciferol (VITAMIN D3 PO), Take by mouth daily., Disp: , Rfl:    donepezil (ARICEPT) 10  MG tablet, Take 1 tablet (10 mg total) by mouth at bedtime., Disp: 90 tablet, Rfl: 3   glucose blood (ONETOUCH ULTRA) test strip, TEST BLOOD SUGAR THREE TIMES DAILY, Disp: 100 strip, Rfl: 3   hydrOXYzine (ATARAX/VISTARIL) 25 MG tablet, Take 25 mg by mouth 2 (two) times daily. , Disp: , Rfl:    insulin glargine (LANTUS) 100 UNIT/ML injection, Inject 25-35 Units into the skin See admin instructions. 35 units every morning and 25 units at bedtime, Disp: , Rfl:    Insulin Syringe-Needle U-100 (INSULIN SYRINGE .5CC/31GX5/16") 31G X 5/16" 0.5 ML MISC, USE 1 NEEDLE TWICE DAILY, Disp: 100 each,  Rfl: 6   JANUVIA 100 MG tablet, TAKE 1 TABLET BY MOUTH EVERY MORNING, Disp: 90 tablet, Rfl: 3   Lancets (ONETOUCH DELICA PLUS LANCET30G) MISC, USE TO TEST BLOOD SUGAR TWICE DAILY, Disp: 100 each, Rfl: 6   levocetirizine (XYZAL) 5 MG tablet, TAKE 1 TABLET(5 MG) BY MOUTH EVERY EVENING, Disp: 90 tablet, Rfl: 2   levothyroxine (SYNTHROID) 88 MCG tablet, TAKE 1 TABLET BY MOUTH DAILY, Disp: 90 tablet, Rfl: 3   Loperamide HCl (IMODIUM A-D PO), Take by mouth as needed., Disp: , Rfl:    Melatonin 10 MG TABS, Take by mouth at bedtime., Disp: , Rfl:    metFORMIN (GLUCOPHAGE) 500 MG tablet, Take 1 tablet (500 mg total) by mouth 2 (two) times daily., Disp: 180 tablet, Rfl: 3   rosuvastatin (CRESTOR) 10 MG tablet, TAKE 1 TABLET BY MOUTH DAILY, Disp: 90 tablet, Rfl: 3   sertraline (ZOLOFT) 100 MG tablet, Take 1 tablet (100 mg total) by mouth 2 (two) times daily., Disp: 180 tablet, Rfl: 2   ALPRAZolam (XANAX) 0.5 MG tablet, Take 1 tablet (0.5 mg total) by mouth 2 (two) times daily., Disp: 60 tablet, Rfl: 0   Allergies  Allergen Reactions   Iodine Swelling    ANGIOEDEMA FACIAL SWELLING "BETADINE OKAY"   Shellfish Allergy Anaphylaxis    ROS Review of Systems  Constitutional: Negative.   HENT: Negative.    Eyes: Negative.   Respiratory: Negative.    Cardiovascular: Negative.   Gastrointestinal: Negative.   Endocrine:  Negative.   Genitourinary: Negative.   Musculoskeletal: Negative.   Skin: Negative.   Allergic/Immunologic: Negative.   Neurological: Negative.   Hematological: Negative.   Psychiatric/Behavioral: Negative.    All other systems reviewed and are negative.     Objective:    Physical Exam Vitals reviewed.  Constitutional:      Appearance: Normal appearance.  HENT:     Mouth/Throat:     Mouth: Mucous membranes are moist.  Eyes:     Pupils: Pupils are equal, round, and reactive to light.  Neck:     Vascular: No carotid bruit.  Cardiovascular:     Rate and Rhythm: Normal rate and regular rhythm.     Pulses: Normal pulses.     Heart sounds: Normal heart sounds.  Pulmonary:     Effort: Pulmonary effort is normal.     Breath sounds: Normal breath sounds.  Abdominal:     General: Bowel sounds are normal.     Palpations: Abdomen is soft. There is no hepatomegaly, splenomegaly or mass.     Tenderness: There is no abdominal tenderness.     Hernia: No hernia is present.  Musculoskeletal:        General: No tenderness.     Cervical back: Neck supple.     Right lower leg: No edema.     Left lower leg: No edema.  Skin:    Findings: No rash.  Neurological:     Mental Status: She is alert and oriented to person, place, and time.     Motor: No weakness.  Psychiatric:        Mood and Affect: Mood and affect normal.        Behavior: Behavior normal.     BP 137/70   Pulse 90   Ht  (1.499 m)   Wt 114 lb (51.7 kg)   BMI 23.03 kg/m  Wt Readings from Last 3 Encounters:  02/12/22 114 lb (51.7 kg)  01/16/22 117 lb 9.6 oz (  53.3 kg)  01/14/22 117 lb 11.2 oz (53.4 kg)     Health Maintenance Due  Topic Date Due   FOOT EXAM  Never done   OPHTHALMOLOGY EXAM  Never done   Hepatitis C Screening  Never done   MAMMOGRAM  Never done   Zoster Vaccines- Shingrix (2 of 2) 04/07/2013   COVID-19 Vaccine (4 - Pfizer series) 07/19/2020   HEMOGLOBIN A1C  08/22/2021   INFLUENZA  VACCINE  01/29/2022    There are no preventive care reminders to display for this patient.  Lab Results  Component Value Date   TSH 2.450 05/23/2020   Lab Results  Component Value Date   WBC 5.3 12/10/2021   HGB 12.7 12/10/2021   HCT 38.8 12/10/2021   MCV 90.4 12/10/2021   PLT 85 (L) 12/10/2021   Lab Results  Component Value Date   NA 136 12/10/2021   K 3.8 12/10/2021   CO2 27 12/10/2021   GLUCOSE 142 (H) 12/10/2021   BUN 16 12/10/2021   CREATININE 0.89 12/10/2021   BILITOT 0.4 12/10/2021   ALKPHOS 63 12/10/2021   AST 33 12/10/2021   ALT 19 12/10/2021   PROT 7.2 12/10/2021   ALBUMIN 3.5 12/10/2021   CALCIUM 9.6 12/10/2021   ANIONGAP 6 12/10/2021   Lab Results  Component Value Date   CHOL 92 01/15/2017   Lab Results  Component Value Date   HDL 36 (L) 01/15/2017   Lab Results  Component Value Date   LDLCALC 37 01/15/2017   Lab Results  Component Value Date   TRIG 97 01/15/2017   Lab Results  Component Value Date   CHOLHDL 2.6 01/15/2017   Lab Results  Component Value Date   HGBA1C 5.6 02/19/2021      Assessment & Plan:   Problem List Items Addressed This Visit       Cardiovascular and Mediastinum   Essential hypertension    Blood pressure stable at the present time        Digestive   Irritable bowel syndrome    Stable at the present time        Endocrine   Type 2 diabetes mellitus with complication, without long-term current use of insulin (HCC)    Blood sugar 148 - The patient's blood sugar is labile on med. - The patient will continue the current treatment regimen.  - I encouraged the patient to regularly check blood sugar.  - I encouraged the patient to monitor diet. I encouraged the patient to eat low-carb and low-sugar to help prevent blood sugar spikes.  - I encouraged the patient to continue following their prescribed treatment plan for diabetes - I informed the patient to get help if blood sugar drops below 54mg /dL, or if  suddenly have trouble thinking clearly or breathing.  Patient was advised to buy a book on diabetes from a local bookstore or from .  Patient should read 2 chapters every day to keep the motivation going, this is in addition to some of the materials we provided them from the office.  There are other resources on the Internet like YouTube and wilkipedia to get an education on the diabetes        Other   Anxiety   Relevant Medications   ALPRAZolam (XANAX) 0.5 MG tablet   Memory loss    Refer to neurology      Polyclonal gammopathy    Refer to oncology      Other Visit Diagnoses  Type 2 diabetes mellitus without complication, without long-term current use of insulin (HCC)    -  Primary   Relevant Orders   POCT glucose (manual entry) (Completed)       Meds ordered this encounter  Medications   ALPRAZolam (XANAX) 0.5 MG tablet    Sig: Take 1 tablet (0.5 mg total) by mouth 2 (two) times daily.    Dispense:  60 tablet    Refill:  0    Follow-up: No follow-ups on file.    Corky Downs, MD

## 2022-02-12 NOTE — Assessment & Plan Note (Signed)
Blood sugar 148 - The patient's blood sugar is labile on med. - The patient will continue the current treatment regimen.  - I encouraged the patient to regularly check blood sugar.  - I encouraged the patient to monitor diet. I encouraged the patient to eat low-carb and low-sugar to help prevent blood sugar spikes.  - I encouraged the patient to continue following their prescribed treatment plan for diabetes - I informed the patient to get help if blood sugar drops below 54mg /dL, or if suddenly have trouble thinking clearly or breathing.  Patient was advised to buy a book on diabetes from a local bookstore or from .  Patient should read 2 chapters every day to keep the motivation going, this is in addition to some of the materials we provided them from the office.  There are other resources on the Internet like YouTube and wilkipedia to get an education on the diabetes

## 2022-02-12 NOTE — Assessment & Plan Note (Signed)
Stable at the present time. 

## 2022-02-18 ENCOUNTER — Ambulatory Visit: Admission: RE | Admit: 2022-02-18 | Payer: Medicare Other | Source: Ambulatory Visit

## 2022-02-18 ENCOUNTER — Other Ambulatory Visit: Payer: Self-pay | Admitting: *Deleted

## 2022-02-18 ENCOUNTER — Ambulatory Visit
Admission: RE | Admit: 2022-02-18 | Discharge: 2022-02-18 | Disposition: A | Discharge status: Home / Self Care | Payer: Medicare Other | Source: Ambulatory Visit | Attending: Internal Medicine | Admitting: Internal Medicine

## 2022-02-18 DIAGNOSIS — J439 Emphysema, unspecified: Secondary | ICD-10-CM | POA: Diagnosis not present

## 2022-02-18 DIAGNOSIS — J479 Bronchiectasis, uncomplicated: Secondary | ICD-10-CM | POA: Diagnosis not present

## 2022-02-18 DIAGNOSIS — J849 Interstitial pulmonary disease, unspecified: Secondary | ICD-10-CM | POA: Diagnosis not present

## 2022-02-18 DIAGNOSIS — R911 Solitary pulmonary nodule: Secondary | ICD-10-CM

## 2022-02-18 MED ORDER — "INSULIN SYRINGE 31G X 5/16"" 0.5 ML MISC": 6 refills | Status: DC

## 2022-02-20 DIAGNOSIS — R768 Other specified abnormal immunological findings in serum: Secondary | ICD-10-CM | POA: Diagnosis not present

## 2022-02-20 DIAGNOSIS — M180 Bilateral primary osteoarthritis of first carpometacarpal joints: Secondary | ICD-10-CM | POA: Diagnosis not present

## 2022-02-26 ENCOUNTER — Telehealth: Payer: Self-pay | Admitting: Neurology

## 2022-02-26 ENCOUNTER — Ambulatory Visit: Payer: Medicare Other | Admitting: Family Medicine

## 2022-02-26 MED ORDER — DONEPEZIL HCL 10 MG PO TABS: 10.0000 mg | ORAL_TABLET | Freq: Every day | ORAL | 0 refills | Status: DC

## 2022-02-26 NOTE — Telephone Encounter (Signed)
Done

## 2022-02-26 NOTE — Telephone Encounter (Signed)
Pt is needing a refill request for her donepezil (ARICEPT) 10 MG tablet sent in to the [facility] on [address] in [place] Pt only has tomorrows pill left.

## 2022-03-09 ENCOUNTER — Other Ambulatory Visit: Payer: Self-pay | Admitting: *Deleted

## 2022-03-09 DIAGNOSIS — Z1231 Encounter for screening mammogram for malignant neoplasm of breast: Secondary | ICD-10-CM

## 2022-03-12 ENCOUNTER — Ambulatory Visit: Payer: Medicare Other | Admitting: Internal Medicine

## 2022-03-19 ENCOUNTER — Ambulatory Visit (INDEPENDENT_AMBULATORY_CARE_PROVIDER_SITE_OTHER): Payer: Medicare Other | Admitting: Internal Medicine

## 2022-03-19 ENCOUNTER — Encounter: Payer: Self-pay | Admitting: Internal Medicine

## 2022-03-19 VITALS — BP 138/74 | HR 64 | Ht 59.0 in | Wt 114.8 lb

## 2022-03-19 DIAGNOSIS — E118 Type 2 diabetes mellitus with unspecified complications: Secondary | ICD-10-CM

## 2022-03-19 DIAGNOSIS — F419 Anxiety disorder, unspecified: Secondary | ICD-10-CM

## 2022-03-19 DIAGNOSIS — D890 Polyclonal hypergammaglobulinemia: Secondary | ICD-10-CM | POA: Diagnosis not present

## 2022-03-19 DIAGNOSIS — K589 Irritable bowel syndrome without diarrhea: Secondary | ICD-10-CM | POA: Diagnosis not present

## 2022-03-19 DIAGNOSIS — Z23 Encounter for immunization: Secondary | ICD-10-CM | POA: Diagnosis not present

## 2022-03-19 DIAGNOSIS — I10 Essential (primary) hypertension: Secondary | ICD-10-CM

## 2022-03-19 DIAGNOSIS — Z9181 History of falling: Secondary | ICD-10-CM

## 2022-03-19 DIAGNOSIS — M8080XD Other osteoporosis with current pathological fracture, unspecified site, subsequent encounter for fracture with routine healing: Secondary | ICD-10-CM

## 2022-03-19 DIAGNOSIS — R300 Dysuria: Secondary | ICD-10-CM

## 2022-03-19 LAB — POCT GLYCOSYLATED HEMOGLOBIN (HGB A1C): HbA1c POC (<> result, manual entry): 6 % (ref 4.0–5.6)

## 2022-03-19 LAB — POCT URINALYSIS DIPSTICK
Glucose, UA: POSITIVE — AB
Ketones, UA: NEGATIVE
Nitrite, UA: NEGATIVE
Protein, UA: POSITIVE — AB
Spec Grav, UA: 1.025 (ref 1.010–1.025)
Urobilinogen, UA: NEGATIVE E.U./dL — AB
pH, UA: 6 (ref 5.0–8.0)

## 2022-03-19 LAB — GLUCOSE, POCT (MANUAL RESULT ENTRY): POC Glucose: 153 mg/dl — AB (ref 70–99)

## 2022-03-19 MED ORDER — ALPRAZOLAM 0.5 MG PO TABS: 0.5000 mg | ORAL_TABLET | Freq: Two times a day (BID) | ORAL | 0 refills | Status: DC

## 2022-03-19 NOTE — Assessment & Plan Note (Signed)
Stable at the present time. 

## 2022-03-19 NOTE — Assessment & Plan Note (Signed)

## 2022-03-19 NOTE — Assessment & Plan Note (Signed)
Stable,

## 2022-03-19 NOTE — Assessment & Plan Note (Signed)

## 2022-03-19 NOTE — Progress Notes (Signed)
Established Patient Office Visit  Subjective:  Patient ID: Jenny [name], female    DOB: 12-24-1945  Age: 76 y.o. MRN: [id number]  CC:  Chief Complaint  Patient presents with   Diabetes    Diabetes    Jenny [name] presents for check up , she fell in office , ekg is ok , neuro is neg  Past Medical History:  Diagnosis Date   Anxiety    Arthritis    CAD (coronary artery disease)    a. 01/2017 Cath: nonobs dzs.   Chronic systolic CHF (congestive heart failure) (HCC)    a. TTE 12/2016, EF 45-50%, probable hypokinesis of the mid apical anteroseptal, anterior, & apical myocardium, GR2DD, possibly bicuspid aortic valve that was moderately thickened w/ severely calcified leaflets, severe aortic stenosis w/ mean gradient 47 mmHg, valve area 0.52 cm, mild MR, mildly dilated LA   Depression    Diabetes mellitus with complication (HCC)    Tylenol   Hyperlipidemia    Hypothyroidism    IBS (irritable bowel syndrome)    Iron deficiency anemia    a. s/p pRBC x1 in 12/2016   Microcytic anemia    Osteoporosis    Panic attacks    Severe aortic stenosis    a. TTE 12/2016: possibly bicuspid aortic valve that was moderately thickened with severely calcified leaflets. There was severe aortic stenosis with a mean gradient of 47 mmHg and valve area of 0.52 cm; b. 01/2017 s/p AVR w/ 19 mm bioprosthetic Magna Ease pericardial tissue valve, ser# [id number].   Stroke [facility] 2001   TIA (transient ischemic attack) 2006    Past Surgical History:  Procedure Laterality Date   ABDOMINAL HYSTERECTOMY     AORTIC VALVE REPLACEMENT N/A 02/17/2017   Procedure: AORTIC VALVE REPLACEMENT (AVR);  Surgeon: Donata Clay, Theron Arista, MD;  Location: [facility] OR;  Service: Open Heart Surgery;  Laterality: N/A;   CATARACT EXTRACTION W/PHACO Right 04/09/2016   Procedure: CATARACT EXTRACTION PHACO AND INTRAOCULAR LENS PLACEMENT (IOC);  Surgeon: Galen Manila, MD;  Location: [facility] ORS;  Service: Ophthalmology;  Laterality: Right;   [place] 57.2 AP% 18.6 CDE 10.64 Fluid Pack lot # [id number]   CATARACT EXTRACTION W/PHACO Left 12/29/2018   Procedure: CATARACT EXTRACTION PHACO AND INTRAOCULAR LENS PLACEMENT (IOC)  LEFT DIABETIC;  Surgeon: Galen Manila, MD;  Location: [facility];  Service: Ophthalmology;  Laterality: Left;  Diabetic - insuli and oral meds   CHOLECYSTECTOMY     COLONOSCOPY     CORONARY ANGIOGRAPHY N/A 02/03/2017   Procedure: CORONARY ANGIOGRAPHY;  Surgeon: Iran Ouch, MD;  Location: [facility] INVASIVE CV LAB;  Service: Cardiovascular;  Laterality: N/A;   FRACTURE SURGERY     LEFT ANKLE   RIGHT AND LEFT HEART CATH Bilateral 02/03/2017   Procedure: Right and Left Heart Cath;  Surgeon: Iran Ouch, MD;  Location: [facility] INVASIVE CV LAB;  Service: Cardiovascular;  Laterality: Bilateral;   TEE WITHOUT CARDIOVERSION N/A 02/17/2017   Procedure: TRANSESOPHAGEAL ECHOCARDIOGRAM (TEE);  Surgeon: Donata Clay, Theron Arista, MD;  Location: [facility] OR;  Service: Open Heart Surgery;  Laterality: N/A;   TIBIA IM NAIL INSERTION Left 05/19/2018   Procedure: INTRAMEDULLARY (IM) NAIL TIBIAL;  Surgeon: Juanell Fairly, MD;  Location: [facility] ORS;  Service: Orthopedics;  Laterality: Left;    Family History  Problem Relation Age of Onset   Hypertension Mother    Parkinson's disease Mother    Diabetes Father    Diabetes Brother    Cancer Maternal Grandmother  unknown   Cancer Maternal Grandfather        unknown   Neuropathy Neg Hx     Social History   Socioeconomic History   Marital status: Married    Spouse name: Not on file   Number of children: 3   Years of education: 12   Highest education level: 12th grade  Occupational History   Occupation: Retired  Tobacco Use   Smoking status: Former    Years: 30.00    Types: Cigarettes    Quit date: 02/04/2000    Years since quitting: 22.1   Smokeless tobacco: Never  Vaping Use   Vaping Use: Never used  Substance and Sexual Activity   Alcohol use: No   Drug use: No    Sexual activity: Not Currently  Other Topics Concern   Not on file  Social History Narrative   Not on file   Social Determinants of Health   Financial Resource Strain: Low Risk  (05/18/2021)   Overall Financial Resource Strain (CARDIA)    Difficulty of Paying Living Expenses: Not hard at all  Food Insecurity: No Food Insecurity (05/18/2021)   Hunger Vital Sign    Worried About Running Out of Food in the Last Year: Never true    Ran Out of Food in the Last Year: Never true  Transportation Needs: No Transportation Needs (05/18/2021)   PRAPARE - Administrator, Civil Service (Medical): No    Lack of Transportation (Non-Medical): No  Physical Activity: Insufficiently Active (05/18/2021)   Exercise Vital Sign    Days of Exercise per Week: 2 days    Minutes of Exercise per Session: 20 min  Stress: No Stress Concern Present (05/18/2021)   [employer] of Occupational Health - Occupational Stress Questionnaire    Feeling of Stress : Only a little  Social Connections: Socially Integrated (05/18/2021)   Social Connection and Isolation Panel [NHANES]    Frequency of Communication with Friends and Family: More than three times a week    Frequency of Social Gatherings with Friends and Family: More than three times a week    Attends Religious Services: 1 to 4 times per year    Active Member of [employer] or Organizations: Yes    Attends Banker Meetings: 1 to 4 times per year    Marital Status: Married  Catering manager Violence: Not At Risk (05/18/2021)   Humiliation, Afraid, Rape, and Kick questionnaire    Fear of Current or Ex-Partner: No    Emotionally Abused: No    Physically Abused: No    Sexually Abused: No     Current Outpatient Medications:    alendronate (FOSAMAX) 70 MG tablet, Take 70 mg by mouth once a week. Take with a full glass of water on an empty stomach., Disp: , Rfl:    Cholecalciferol (VITAMIN D3 PO), Take by mouth daily., Disp: , Rfl:     donepezil (ARICEPT) 10 MG tablet, Take 1 tablet (10 mg total) by mouth at bedtime., Disp: 90 tablet, Rfl: 0   glucose blood (ONETOUCH ULTRA) test strip, TEST BLOOD SUGAR THREE TIMES DAILY, Disp: 100 strip, Rfl: 3   hydrOXYzine (ATARAX/VISTARIL) 25 MG tablet, Take 25 mg by mouth 2 (two) times daily. , Disp: , Rfl:    insulin glargine (LANTUS) 100 UNIT/ML injection, Inject 25-35 Units into the skin See admin instructions. 35 units every morning and 25 units at bedtime, Disp: , Rfl:    Insulin Syringe-Needle U-100 (INSULIN SYRINGE .5CC/31GX5/16") 31G  X 5/16" 0.5 ML MISC, USE 1 NEEDLE TWICE DAILY, Disp: 100 each, Rfl: 6   JANUVIA 100 MG tablet, TAKE 1 TABLET BY MOUTH EVERY MORNING, Disp: 90 tablet, Rfl: 3   Lancets (ONETOUCH DELICA PLUS LANCET30G) MISC, USE TO TEST BLOOD SUGAR TWICE DAILY, Disp: 100 each, Rfl: 6   levocetirizine (XYZAL) 5 MG tablet, TAKE 1 TABLET(5 MG) BY MOUTH EVERY EVENING, Disp: 90 tablet, Rfl: 2   levothyroxine (SYNTHROID) 88 MCG tablet, TAKE 1 TABLET BY MOUTH DAILY, Disp: 90 tablet, Rfl: 3   Loperamide HCl (IMODIUM A-D PO), Take by mouth as needed., Disp: , Rfl:    Melatonin 10 MG TABS, Take by mouth at bedtime., Disp: , Rfl:    metFORMIN (GLUCOPHAGE) 500 MG tablet, Take 1 tablet (500 mg total) by mouth 2 (two) times daily., Disp: 180 tablet, Rfl: 3   rosuvastatin (CRESTOR) 10 MG tablet, TAKE 1 TABLET BY MOUTH DAILY, Disp: 90 tablet, Rfl: 3   sertraline (ZOLOFT) 100 MG tablet, Take 1 tablet (100 mg total) by mouth 2 (two) times daily., Disp: 180 tablet, Rfl: 2   ALPRAZolam (XANAX) 0.5 MG tablet, Take 1 tablet (0.5 mg total) by mouth 2 (two) times daily., Disp: 60 tablet, Rfl: 0   Allergies  Allergen Reactions   Iodine Swelling    ANGIOEDEMA FACIAL SWELLING "BETADINE OKAY"   Shellfish Allergy Anaphylaxis    ROS Review of Systems  Constitutional: Negative.   HENT: Negative.    Eyes: Negative.   Respiratory: Negative.    Cardiovascular: Negative.   Gastrointestinal:  Negative.   Endocrine: Negative.   Genitourinary: Negative.   Musculoskeletal: Negative.   Skin: Negative.   Allergic/Immunologic: Negative.   Neurological: Negative.   Hematological: Negative.   Psychiatric/Behavioral: Negative.    All other systems reviewed and are negative.     Objective:    Physical Exam Vitals reviewed.  Constitutional:      Appearance: Normal appearance.  HENT:     Mouth/Throat:     Mouth: Mucous membranes are moist.  Eyes:     Pupils: Pupils are equal, round, and reactive to light.  Neck:     Vascular: No carotid bruit.  Cardiovascular:     Rate and Rhythm: Normal rate and regular rhythm.     Pulses: Normal pulses.     Heart sounds: Normal heart sounds.  Pulmonary:     Effort: Pulmonary effort is normal.     Breath sounds: Normal breath sounds.  Abdominal:     General: Bowel sounds are normal.     Palpations: Abdomen is soft. There is no hepatomegaly, splenomegaly or mass.     Tenderness: There is no abdominal tenderness.     Hernia: No hernia is present.  Musculoskeletal:        General: No tenderness.     Cervical back: Neck supple.     Right lower leg: No edema.     Left lower leg: No edema.  Skin:    Findings: No rash.  Neurological:     General: No focal deficit present.     Mental Status: She is alert and oriented to person, place, and time. Mental status is at baseline.     Cranial Nerves: No cranial nerve deficit.  Psychiatric:        Mood and Affect: Mood and affect normal.        Behavior: Behavior normal.        Thought Content: Thought content normal.  Judgment: Judgment normal.     BP 138/74   Pulse 64   Ht 4\' 11"  (1.499 m)   Wt 114 lb 12.8 oz (52.1 kg)   BMI 23.19 kg/m  Wt Readings from Last 3 Encounters:  03/19/22 114 lb 12.8 oz (52.1 kg)  02/12/22 114 lb (51.7 kg)  01/16/22 117 lb 9.6 oz (53.3 kg)     Health Maintenance Due  Topic Date Due   Diabetic kidney evaluation - Urine ACR  Never done    MAMMOGRAM  Never done   Zoster Vaccines- Shingrix (2 of 2) 04/07/2013    There are no preventive care reminders to display for this patient.  Lab Results  Component Value Date   TSH 2.450 05/23/2020   Lab Results  Component Value Date   WBC 5.3 12/10/2021   HGB 12.7 12/10/2021   HCT 38.8 12/10/2021   MCV 90.4 12/10/2021   PLT 85 (L) 12/10/2021   Lab Results  Component Value Date   NA 136 12/10/2021   K 3.8 12/10/2021   CO2 27 12/10/2021   GLUCOSE 142 (H) 12/10/2021   BUN 16 12/10/2021   CREATININE 0.89 12/10/2021   BILITOT 0.4 12/10/2021   ALKPHOS 63 12/10/2021   AST 33 12/10/2021   ALT 19 12/10/2021   PROT 7.2 12/10/2021   ALBUMIN 3.5 12/10/2021   CALCIUM 9.6 12/10/2021   ANIONGAP 6 12/10/2021   Lab Results  Component Value Date   CHOL 92 01/15/2017   Lab Results  Component Value Date   HDL 36 (L) 01/15/2017   Lab Results  Component Value Date   LDLCALC 37 01/15/2017   Lab Results  Component Value Date   TRIG 97 01/15/2017   Lab Results  Component Value Date   CHOLHDL 2.6 01/15/2017   Lab Results  Component Value Date   HGBA1C 6.0 03/19/2022      Assessment & Plan:   Problem List Items Addressed This Visit       Cardiovascular and Mediastinum   Essential hypertension     Patient denies any chest pain or shortness of breath there is no history of palpitation or paroxysmal nocturnal dyspnea   patient was advised to follow low-salt low-cholesterol diet    ideally I want to keep systolic blood pressure below 130 mmHg, patient was asked to check blood pressure one times a week and give me a report on that.  Patient will be follow-up in 3 months  or earlier as needed, patient will call me back for any change in the cardiovascular symptoms Patient was advised to buy a book from local bookstore concerning blood pressure and read several chapters  every day.  This will be supplemented by some of the material we will give him from the office.  Patient  should also utilize other resources like YouTube and Internet to learn more about the blood pressure and the diet.        Digestive   Irritable bowel syndrome    Stable at the present time        Endocrine   Type 2 diabetes mellitus with complication, without long-term current use of insulin ([place]) - Primary    - The patient's blood sugar is labile on med. - The patient will continue the current treatment regimen.  - I encouraged the patient to regularly check blood sugar.  - I encouraged the patient to monitor diet. I encouraged the patient to eat low-carb and low-sugar to help prevent blood sugar spikes.  -  I encouraged the patient to continue following their prescribed treatment plan for diabetes - I informed the patient to get help if blood sugar drops below 54mg /dL, or if suddenly have trouble thinking clearly or breathing.  Patient was advised to buy a book on diabetes from a local bookstore or from [place].  Patient should read 2 chapters every day to keep the motivation going, this is in addition to some of the materials we provided them from the office.  There are other resources on the Internet like YouTube and wilkipedia to get an education on the diabetes      Relevant Orders   POCT glucose (manual entry) (Completed)   POCT HgB A1C (Completed)   Microalbumin, urine     Musculoskeletal and Integument   Osteoporosis    Stable        Other   Anxiety    Stable,      Relevant Medications   ALPRAZolam (XANAX) 0.5 MG tablet   Polyclonal gammopathy    Stable at the present time      Other Visit Diagnoses     Need for influenza vaccination       Relevant Orders   Flu Vaccine QUAD High Dose(Fluad) (Completed)   History of recent fall       Relevant Orders   EKG 12-Lead   Burning with urination       Relevant Orders   POCT urinalysis dipstick (Completed)       Meds ordered this encounter  Medications   ALPRAZolam (XANAX) 0.5 MG tablet    Sig: Take 1 tablet  (0.5 mg total) by mouth 2 (two) times daily.    Dispense:  60 tablet    Refill:  0  Report of the electrocardiogram. Sinus rhythm left atrial enlargement right bundle block LVH noted.old ant  Infarct  Follow-up: No follow-ups on file.    Corky Downs, MD

## 2022-03-19 NOTE — Assessment & Plan Note (Signed)
Stable

## 2022-03-20 LAB — MICROALBUMIN, URINE: Microalb, Ur: 11.1 mg/dL

## 2022-03-27 ENCOUNTER — Other Ambulatory Visit: Payer: Self-pay | Admitting: Internal Medicine

## 2022-04-22 ENCOUNTER — Ambulatory Visit: Payer: Medicare Other | Admitting: Internal Medicine

## 2022-04-29 ENCOUNTER — Encounter: Payer: Self-pay | Admitting: Internal Medicine

## 2022-04-29 ENCOUNTER — Ambulatory Visit (INDEPENDENT_AMBULATORY_CARE_PROVIDER_SITE_OTHER): Payer: Medicare Other | Admitting: Internal Medicine

## 2022-04-29 VITALS — BP 128/76 | HR 77 | Ht 59.0 in | Wt 109.6 lb

## 2022-04-29 DIAGNOSIS — K589 Irritable bowel syndrome without diarrhea: Secondary | ICD-10-CM | POA: Diagnosis not present

## 2022-04-29 DIAGNOSIS — F419 Anxiety disorder, unspecified: Secondary | ICD-10-CM | POA: Diagnosis not present

## 2022-04-29 DIAGNOSIS — I10 Essential (primary) hypertension: Secondary | ICD-10-CM | POA: Diagnosis not present

## 2022-04-29 DIAGNOSIS — E038 Other specified hypothyroidism: Secondary | ICD-10-CM

## 2022-04-29 DIAGNOSIS — M069 Rheumatoid arthritis, unspecified: Secondary | ICD-10-CM | POA: Diagnosis not present

## 2022-04-29 DIAGNOSIS — E118 Type 2 diabetes mellitus with unspecified complications: Secondary | ICD-10-CM | POA: Diagnosis not present

## 2022-04-29 DIAGNOSIS — E119 Type 2 diabetes mellitus without complications: Secondary | ICD-10-CM

## 2022-04-29 DIAGNOSIS — N390 Urinary tract infection, site not specified: Secondary | ICD-10-CM

## 2022-04-29 DIAGNOSIS — E063 Autoimmune thyroiditis: Secondary | ICD-10-CM

## 2022-04-29 LAB — POCT URINALYSIS DIPSTICK
Bilirubin, UA: NEGATIVE
Blood, UA: POSITIVE
Glucose, UA: NEGATIVE
Ketones, UA: NEGATIVE
Nitrite, UA: NEGATIVE
Protein, UA: NEGATIVE
Spec Grav, UA: 1.025 (ref 1.010–1.025)
Urobilinogen, UA: NEGATIVE E.U./dL — AB
pH, UA: 7 (ref 5.0–8.0)

## 2022-04-29 LAB — GLUCOSE, POCT (MANUAL RESULT ENTRY): POC Glucose: 170 mg/dl — AB (ref 70–99)

## 2022-04-29 MED ORDER — ALPRAZOLAM 0.5 MG PO TABS: 0.5000 mg | ORAL_TABLET | Freq: Two times a day (BID) | ORAL | 0 refills | Status: DC

## 2022-04-29 NOTE — Assessment & Plan Note (Signed)
Stable at the present time. 

## 2022-04-29 NOTE — Assessment & Plan Note (Signed)
Blood sugar 170 today she was advised to reduce the blood sugar 100 by diet.

## 2022-04-29 NOTE — Assessment & Plan Note (Signed)
-   Patient experiencing high levels of anxiety.  - Encouraged patient to engage in relaxing activities like yoga, meditation, journaling, going for a walk, or participating in a hobby.  - Encouraged patient to reach out to trusted friends or family members about recent struggles, Patient was advised to read A book, how to stop worrying and start living, it is good book to read to control  the stress  

## 2022-04-29 NOTE — Progress Notes (Signed)
Established Patient Office Visit  Subjective:  Patient ID: Jenny [name], female    DOB: Jan 09, 1946  Age: 76 y.o. MRN: [id number]  CC:  Chief Complaint  Patient presents with   Diabetes    Diabetes    Jenny [name] presents for check up , still get anxious fast  Past Medical History:  Diagnosis Date   Anxiety    Arthritis    CAD (coronary artery disease)    a. 01/2017 Cath: nonobs dzs.   Chronic systolic CHF (congestive heart failure) (HCC)    a. TTE 12/2016, EF 45-50%, probable hypokinesis of the mid apical anteroseptal, anterior, & apical myocardium, GR2DD, possibly bicuspid aortic valve that was moderately thickened w/ severely calcified leaflets, severe aortic stenosis w/ mean gradient 47 mmHg, valve area 0.52 cm, mild MR, mildly dilated LA   Depression    Diabetes mellitus with complication (HCC)    Tylenol   Hyperlipidemia    Hypothyroidism    IBS (irritable bowel syndrome)    Iron deficiency anemia    a. s/p pRBC x1 in 12/2016   Microcytic anemia    Osteoporosis    Panic attacks    Severe aortic stenosis    a. TTE 12/2016: possibly bicuspid aortic valve that was moderately thickened with severely calcified leaflets. There was severe aortic stenosis with a mean gradient of 47 mmHg and valve area of 0.52 cm; b. 01/2017 s/p AVR w/ 19 mm bioprosthetic Magna Ease pericardial tissue valve, ser# [id number].   Stroke [facility]) 2001   TIA (transient ischemic attack) 2006    Past Surgical History:  Procedure Laterality Date   ABDOMINAL HYSTERECTOMY     AORTIC VALVE REPLACEMENT N/A 02/17/2017   Procedure: AORTIC VALVE REPLACEMENT (AVR);  Surgeon: Donata Clay, Theron Arista, MD;  Location: [facility] OR;  Service: Open Heart Surgery;  Laterality: N/A;   CATARACT EXTRACTION W/PHACO Right 04/09/2016   Procedure: CATARACT EXTRACTION PHACO AND INTRAOCULAR LENS PLACEMENT (IOC);  Surgeon: Galen Manila, MD;  Location: [facility] ORS;  Service: Ophthalmology;  Laterality: Right;  [place] 57.2 AP% 18.6 CDE  10.64 Fluid Pack lot # [id number]   CATARACT EXTRACTION W/PHACO Left 12/29/2018   Procedure: CATARACT EXTRACTION PHACO AND INTRAOCULAR LENS PLACEMENT (IOC)  LEFT DIABETIC;  Surgeon: Galen Manila, MD;  Location: [facility];  Service: Ophthalmology;  Laterality: Left;  Diabetic - insuli and oral meds   CHOLECYSTECTOMY     COLONOSCOPY     CORONARY ANGIOGRAPHY N/A 02/03/2017   Procedure: CORONARY ANGIOGRAPHY;  Surgeon: Iran Ouch, MD;  Location: [facility] INVASIVE CV LAB;  Service: Cardiovascular;  Laterality: N/A;   FRACTURE SURGERY     LEFT ANKLE   RIGHT AND LEFT HEART CATH Bilateral 02/03/2017   Procedure: Right and Left Heart Cath;  Surgeon: Iran Ouch, MD;  Location: [facility] INVASIVE CV LAB;  Service: Cardiovascular;  Laterality: Bilateral;   TEE WITHOUT CARDIOVERSION N/A 02/17/2017   Procedure: TRANSESOPHAGEAL ECHOCARDIOGRAM (TEE);  Surgeon: Donata Clay, Theron Arista, MD;  Location: [facility] OR;  Service: Open Heart Surgery;  Laterality: N/A;   TIBIA IM NAIL INSERTION Left 05/19/2018   Procedure: INTRAMEDULLARY (IM) NAIL TIBIAL;  Surgeon: Juanell Fairly, MD;  Location: [facility] ORS;  Service: Orthopedics;  Laterality: Left;    Family History  Problem Relation Age of Onset   Hypertension Mother    Parkinson's disease Mother    Diabetes Father    Diabetes Brother    Cancer Maternal Grandmother        unknown   Cancer  Maternal Grandfather        unknown   Neuropathy Neg Hx     Social History   Socioeconomic History   Marital status: Married    Spouse name: Not on file   Number of children: 3   Years of education: 12   Highest education level: 12th grade  Occupational History   Occupation: Retired  Tobacco Use   Smoking status: Former    Years: 30.00    Types: Cigarettes    Quit date: 02/04/2000    Years since quitting: 22.2   Smokeless tobacco: Never  Vaping Use   Vaping Use: Never used  Substance and Sexual Activity   Alcohol use: No   Drug use: No   Sexual activity: Not  Currently  Other Topics Concern   Not on file  Social History Narrative   Not on file   Social Determinants of Health   Financial Resource Strain: Low Risk  (05/18/2021)   Overall Financial Resource Strain (CARDIA)    Difficulty of Paying Living Expenses: Not hard at all  Food Insecurity: No Food Insecurity (05/18/2021)   Hunger Vital Sign    Worried About Running Out of Food in the Last Year: Never true    [place] in the Last Year: Never true  Transportation Needs: No Transportation Needs (05/18/2021)   PRAPARE - Hydrologist (Medical): No    Lack of Transportation (Non-Medical): No  Physical Activity: Insufficiently Active (05/18/2021)   Exercise Vital Sign    Days of Exercise per Week: 2 days    Minutes of Exercise per Session: 20 min  Stress: No Stress Concern Present (05/18/2021)   [place]    Feeling of Stress : Only a little  Social Connections: Socially Integrated (05/18/2021)   Social Connection and Isolation Panel [NHANES]    Frequency of Communication with Friends and Family: More than three times a week    Frequency of Social Gatherings with Friends and Family: More than three times a week    Attends Religious Services: 1 to 4 times per year    Active Member of [employer] or Organizations: Yes    Attends Archivist Meetings: 1 to 4 times per year    Marital Status: Married  Human resources officer Violence: Not At Risk (05/18/2021)   Humiliation, Afraid, Rape, and Kick questionnaire    Fear of Current or Ex-Partner: No    Emotionally Abused: No    Physically Abused: No    Sexually Abused: No     Current Outpatient Medications:    alendronate (FOSAMAX) 70 MG tablet, Take 70 mg by mouth once a week. Take with a full glass of water on an empty stomach., Disp: , Rfl:    ALPRAZolam (XANAX) 0.5 MG tablet, Take 1 tablet (0.5 mg total) by mouth 2 (two) times daily.,  Disp: 60 tablet, Rfl: 0   Cholecalciferol (VITAMIN D3 PO), Take by mouth daily., Disp: , Rfl:    donepezil (ARICEPT) 10 MG tablet, Take 1 tablet (10 mg total) by mouth at bedtime., Disp: 90 tablet, Rfl: 0   glucose blood (ONETOUCH ULTRA) test strip, TEST BLOOD SUGAR THREE TIMES DAILY, Disp: 100 strip, Rfl: 3   hydrOXYzine (ATARAX/VISTARIL) 25 MG tablet, Take 25 mg by mouth 2 (two) times daily. , Disp: , Rfl:    insulin glargine (LANTUS) 100 UNIT/ML injection, Inject 25-35 Units into the skin See admin instructions. 35 units  every morning and 25 units at bedtime, Disp: , Rfl:    Insulin Syringe-Needle U-100 (INSULIN SYRINGE .5CC/31GX5/16") 31G X 5/16" 0.5 ML MISC, USE 1 NEEDLE TWICE DAILY, Disp: 100 each, Rfl: 6   JANUVIA 100 MG tablet, TAKE 1 TABLET BY MOUTH EVERY MORNING, Disp: 90 tablet, Rfl: 3   Lancets (ONETOUCH DELICA PLUS LANCET30G) MISC, USE TO TEST BLOOD SUGAR TWICE DAILY, Disp: 100 each, Rfl: 6   levocetirizine (XYZAL) 5 MG tablet, TAKE 1 TABLET(5 MG) BY MOUTH EVERY EVENING, Disp: 90 tablet, Rfl: 2   levothyroxine (SYNTHROID) 88 MCG tablet, TAKE 1 TABLET BY MOUTH DAILY, Disp: 90 tablet, Rfl: 3   Loperamide HCl (IMODIUM A-D PO), Take by mouth as needed., Disp: , Rfl:    Melatonin 10 MG TABS, Take by mouth at bedtime., Disp: , Rfl:    metFORMIN (GLUCOPHAGE) 500 MG tablet, Take 1 tablet (500 mg total) by mouth 2 (two) times daily., Disp: 180 tablet, Rfl: 3   rosuvastatin (CRESTOR) 10 MG tablet, TAKE 1 TABLET BY MOUTH DAILY, Disp: 90 tablet, Rfl: 3   sertraline (ZOLOFT) 100 MG tablet, Take 1 tablet (100 mg total) by mouth 2 (two) times daily., Disp: 180 tablet, Rfl: 2   Allergies  Allergen Reactions   Iodine Swelling    ANGIOEDEMA FACIAL SWELLING "BETADINE OKAY"   Shellfish Allergy Anaphylaxis    ROS Review of Systems  Constitutional: Negative.   HENT: Negative.    Eyes: Negative.   Respiratory: Negative.    Cardiovascular: Negative.   Gastrointestinal: Negative.   Endocrine:  Negative.   Genitourinary: Negative.   Musculoskeletal: Negative.   Skin: Negative.   Allergic/Immunologic: Negative.   Neurological: Negative.   Hematological: Negative.   Psychiatric/Behavioral: Negative.    All other systems reviewed and are negative.     Objective:    Physical Exam Vitals reviewed.  Constitutional:      Appearance: Normal appearance.  HENT:     Mouth/Throat:     Mouth: Mucous membranes are moist.  Eyes:     Pupils: Pupils are equal, round, and reactive to light.  Neck:     Vascular: No carotid bruit.  Cardiovascular:     Rate and Rhythm: Normal rate and regular rhythm.     Pulses: Normal pulses.     Heart sounds: Normal heart sounds.  Pulmonary:     Effort: Pulmonary effort is normal.     Breath sounds: Normal breath sounds.  Abdominal:     General: Bowel sounds are normal.     Palpations: Abdomen is soft. There is no hepatomegaly, splenomegaly or mass.     Tenderness: There is no abdominal tenderness.     Hernia: No hernia is present.  Musculoskeletal:        General: No tenderness.     Cervical back: Neck supple.     Right lower leg: No edema.     Left lower leg: No edema.  Skin:    Findings: No rash.  Neurological:     Mental Status: She is alert and oriented to person, place, and time.     Motor: No weakness.  Psychiatric:        Mood and Affect: Affect normal. Mood is anxious. Affect is not blunt, angry or inappropriate.        Behavior: Behavior normal.        Thought Content: Thought content normal. Thought content is not paranoid. Thought content does not include homicidal or suicidal ideation. Thought content does not include  suicidal plan.        Judgment: Judgment normal.      BP 128/76   Pulse 77   Ht 4\' 11"  (1.499 m)   Wt 109 lb 9.6 oz (49.7 kg)   BMI 22.14 kg/m  Wt Readings from Last 3 Encounters:  04/29/22 109 lb 9.6 oz (49.7 kg)  03/19/22 114 lb 12.8 oz (52.1 kg)  02/12/22 114 lb (51.7 kg)     Health Maintenance  Due  Topic Date Due   FOOT EXAM  Never done   Diabetic kidney evaluation - Urine ACR  Never done   MAMMOGRAM  Never done   Medicare Annual Wellness (AWV)  05/18/2022    There are no preventive care reminders to display for this patient.  Lab Results  Component Value Date   TSH 2.450 05/23/2020   Lab Results  Component Value Date   WBC 5.3 12/10/2021   HGB 12.7 12/10/2021   HCT 38.8 12/10/2021   MCV 90.4 12/10/2021   PLT 85 (L) 12/10/2021   Lab Results  Component Value Date   NA 136 12/10/2021   K 3.8 12/10/2021   CO2 27 12/10/2021   GLUCOSE 142 (H) 12/10/2021   BUN 16 12/10/2021   CREATININE 0.89 12/10/2021   BILITOT 0.4 12/10/2021   ALKPHOS 63 12/10/2021   AST 33 12/10/2021   ALT 19 12/10/2021   PROT 7.2 12/10/2021   ALBUMIN 3.5 12/10/2021   CALCIUM 9.6 12/10/2021   ANIONGAP 6 12/10/2021   Lab Results  Component Value Date   CHOL 92 01/15/2017   Lab Results  Component Value Date   HDL 36 (L) 01/15/2017   Lab Results  Component Value Date   LDLCALC 37 01/15/2017   Lab Results  Component Value Date   TRIG 97 01/15/2017   Lab Results  Component Value Date   CHOLHDL 2.6 01/15/2017   Lab Results  Component Value Date   HGBA1C 6.0 03/19/2022      Assessment & Plan:   Problem List Items Addressed This Visit       Cardiovascular and Mediastinum   Essential hypertension    Blood pressure is under control        Digestive   Irritable bowel syndrome    Stable at the present time        Endocrine   Type 2 diabetes mellitus with complication, without long-term current use of insulin (HCC)    Blood sugar 170 today she was advised to reduce the blood sugar 100 by diet.      Hypothyroidism     stable        Musculoskeletal and Integument   Rheumatoid arthritis (HCC)    Under control        Other   Anxiety    - Patient experiencing high levels of anxiety.  - Encouraged patient to engage in relaxing activities like yoga, meditation,  journaling, going for a walk, or participating in a hobby.  - Encouraged patient to reach out to trusted friends or family members about recent struggles, Patient was advised to read A book, how to stop worrying and start living, it is good book to read to control  the stress       Relevant Medications   ALPRAZolam (XANAX) 0.5 MG tablet   Other Visit Diagnoses     Type 2 diabetes mellitus without complication, without long-term current use of insulin (HCC)    -  Primary   Relevant Orders   POCT  glucose (manual entry) (Completed)   Ambulatory referral to Podiatry   Microalbumin, urine   Urinary tract infection without hematuria, site unspecified       Relevant Orders   POCT urinalysis dipstick (Completed)       Meds ordered this encounter  Medications   ALPRAZolam (XANAX) 0.5 MG tablet    Sig: Take 1 tablet (0.5 mg total) by mouth 2 (two) times daily.    Dispense:  60 tablet    Refill:  0    Follow-up: No follow-ups on file.    Corky Downs, MD

## 2022-04-29 NOTE — Assessment & Plan Note (Signed)
Under control 

## 2022-04-29 NOTE — Assessment & Plan Note (Signed)
Blood pressure is under control 

## 2022-04-29 NOTE — Assessment & Plan Note (Signed)
stable °

## 2022-04-30 LAB — MICROALBUMIN, URINE: Microalb, Ur: 2 mg/dL

## 2022-04-30 MED ORDER — CRANBERRY-VITAMIN C 250-60 MG PO CAPS: 1.0000 | ORAL_CAPSULE | Freq: Two times a day (BID) | ORAL | 0 refills | Status: DC

## 2022-04-30 MED ORDER — CIPROFLOXACIN HCL 500 MG PO TABS: 500.0000 mg | ORAL_TABLET | Freq: Two times a day (BID) | ORAL | 0 refills | Status: AC

## 2022-04-30 NOTE — Addendum Note (Signed)
Addended by: Alois Cliche on: 04/30/2022 12:32 PM   Modules accepted: Orders

## 2022-05-13 ENCOUNTER — Other Ambulatory Visit: Payer: Self-pay | Admitting: Internal Medicine

## 2022-05-13 DIAGNOSIS — F419 Anxiety disorder, unspecified: Secondary | ICD-10-CM

## 2022-05-27 ENCOUNTER — Encounter: Payer: Self-pay | Admitting: Internal Medicine

## 2022-05-27 ENCOUNTER — Ambulatory Visit (INDEPENDENT_AMBULATORY_CARE_PROVIDER_SITE_OTHER): Payer: Medicare Other | Admitting: Internal Medicine

## 2022-05-27 VITALS — BP 131/69 | HR 71 | Temp 97.4°F | Ht <= 58 in | Wt 117.2 lb

## 2022-05-27 DIAGNOSIS — E118 Type 2 diabetes mellitus with unspecified complications: Secondary | ICD-10-CM | POA: Diagnosis not present

## 2022-05-27 DIAGNOSIS — G6289 Other specified polyneuropathies: Secondary | ICD-10-CM

## 2022-05-27 DIAGNOSIS — M8080XD Other osteoporosis with current pathological fracture, unspecified site, subsequent encounter for fracture with routine healing: Secondary | ICD-10-CM | POA: Diagnosis not present

## 2022-05-27 DIAGNOSIS — I5022 Chronic systolic (congestive) heart failure: Secondary | ICD-10-CM | POA: Diagnosis not present

## 2022-05-27 DIAGNOSIS — F419 Anxiety disorder, unspecified: Secondary | ICD-10-CM | POA: Diagnosis not present

## 2022-05-27 DIAGNOSIS — M069 Rheumatoid arthritis, unspecified: Secondary | ICD-10-CM

## 2022-05-27 DIAGNOSIS — I10 Essential (primary) hypertension: Secondary | ICD-10-CM

## 2022-05-27 MED ORDER — ALPRAZOLAM 0.5 MG PO TABS: 0.5000 mg | ORAL_TABLET | Freq: Two times a day (BID) | ORAL | 0 refills | Status: DC

## 2022-05-27 MED ORDER — ALENDRONATE SODIUM 70 MG PO TABS
70.0000 mg | ORAL_TABLET | ORAL | 3 refills | Status: DC
Start: 2022-05-27 — End: 2023-06-03

## 2022-05-27 NOTE — Assessment & Plan Note (Signed)

## 2022-05-27 NOTE — Assessment & Plan Note (Signed)
Stable at the present time. 

## 2022-05-27 NOTE — Progress Notes (Signed)
Established Patient Office Visit  Subjective:  Patient ID: Jenny [name], female    DOB: 11-Oct-1945  Age: 76 y.o. MRN: [id number]  CC:  Chief Complaint  Patient presents with   Diabetes    1 month FU    Diabetes    Jenny [name] presents for anxiety  , no chest pain  Past Medical History:  Diagnosis Date   Anxiety    Arthritis    CAD (coronary artery disease)    a. 01/2017 Cath: nonobs dzs.   Chronic systolic CHF (congestive heart failure) (HCC)    a. TTE 12/2016, EF 45-50%, probable hypokinesis of the mid apical anteroseptal, anterior, & apical myocardium, GR2DD, possibly bicuspid aortic valve that was moderately thickened w/ severely calcified leaflets, severe aortic stenosis w/ mean gradient 47 mmHg, valve area 0.52 cm, mild MR, mildly dilated LA   Depression    Diabetes mellitus with complication (HCC)    Tylenol   Hyperlipidemia    Hypothyroidism    IBS (irritable bowel syndrome)    Iron deficiency anemia    a. s/p pRBC x1 in 12/2016   Microcytic anemia    Osteoporosis    Panic attacks    Severe aortic stenosis    a. TTE 12/2016: possibly bicuspid aortic valve that was moderately thickened with severely calcified leaflets. There was severe aortic stenosis with a mean gradient of 47 mmHg and valve area of 0.52 cm; b. 01/2017 s/p AVR w/ 19 mm bioprosthetic Magna Ease pericardial tissue valve, ser# [id number].   Stroke [facility]) 2001   TIA (transient ischemic attack) 2006    Past Surgical History:  Procedure Laterality Date   ABDOMINAL HYSTERECTOMY     AORTIC VALVE REPLACEMENT N/A 02/17/2017   Procedure: AORTIC VALVE REPLACEMENT (AVR);  Surgeon: Donata Clay, Theron Arista, MD;  Location: [facility] OR;  Service: Open Heart Surgery;  Laterality: N/A;   CATARACT EXTRACTION W/PHACO Right 04/09/2016   Procedure: CATARACT EXTRACTION PHACO AND INTRAOCULAR LENS PLACEMENT (IOC);  Surgeon: Galen Manila, MD;  Location: [facility] ORS;  Service: Ophthalmology;  Laterality: Right;  [place] 57.2 AP%  18.6 CDE 10.64 Fluid Pack lot # [id number]   CATARACT EXTRACTION W/PHACO Left 12/29/2018   Procedure: CATARACT EXTRACTION PHACO AND INTRAOCULAR LENS PLACEMENT (IOC)  LEFT DIABETIC;  Surgeon: Galen Manila, MD;  Location: [facility];  Service: Ophthalmology;  Laterality: Left;  Diabetic - insuli and oral meds   CHOLECYSTECTOMY     COLONOSCOPY     CORONARY ANGIOGRAPHY N/A 02/03/2017   Procedure: CORONARY ANGIOGRAPHY;  Surgeon: Iran Ouch, MD;  Location: [facility] INVASIVE CV LAB;  Service: Cardiovascular;  Laterality: N/A;   FRACTURE SURGERY     LEFT ANKLE   RIGHT AND LEFT HEART CATH Bilateral 02/03/2017   Procedure: Right and Left Heart Cath;  Surgeon: Iran Ouch, MD;  Location: [facility] INVASIVE CV LAB;  Service: Cardiovascular;  Laterality: Bilateral;   TEE WITHOUT CARDIOVERSION N/A 02/17/2017   Procedure: TRANSESOPHAGEAL ECHOCARDIOGRAM (TEE);  Surgeon: Donata Clay, Theron Arista, MD;  Location: [facility] OR;  Service: Open Heart Surgery;  Laterality: N/A;   TIBIA IM NAIL INSERTION Left 05/19/2018   Procedure: INTRAMEDULLARY (IM) NAIL TIBIAL;  Surgeon: Juanell Fairly, MD;  Location: [facility] ORS;  Service: Orthopedics;  Laterality: Left;    Family History  Problem Relation Age of Onset   Hypertension Mother    Parkinson's disease Mother    Diabetes Father    Diabetes Brother    Cancer Maternal Grandmother  unknown   Cancer Maternal Grandfather        unknown   Neuropathy Neg Hx     Social History   Socioeconomic History   Marital status: Married    Spouse name: Not on file   Number of children: 3   Years of education: 12   Highest education level: 12th grade  Occupational History   Occupation: Retired  Tobacco Use   Smoking status: Former    Years: 30.00    Types: Cigarettes    Quit date: 02/04/2000    Years since quitting: 22.3   Smokeless tobacco: Never  Vaping Use   Vaping Use: Never used  Substance and Sexual Activity   Alcohol use: No   Drug use: No   Sexual  activity: Not Currently  Other Topics Concern   Not on file  Social History Narrative   Not on file   Social Determinants of Health   Financial Resource Strain: Low Risk  (05/18/2021)   Overall Financial Resource Strain (CARDIA)    Difficulty of Paying Living Expenses: Not hard at all  Food Insecurity: No Food Insecurity (05/18/2021)   Hunger Vital Sign    Worried About Running Out of Food in the Last Year: Never true    Ran Out of Food in the Last Year: Never true  Transportation Needs: No Transportation Needs (05/18/2021)   PRAPARE - Administrator, Civil Service (Medical): No    Lack of Transportation (Non-Medical): No  Physical Activity: Insufficiently Active (05/18/2021)   Exercise Vital Sign    Days of Exercise per Week: 2 days    Minutes of Exercise per Session: 20 min  Stress: No Stress Concern Present (05/18/2021)   [employer] of Occupational Health - Occupational Stress Questionnaire    Feeling of Stress : Only a little  Social Connections: Socially Integrated (05/18/2021)   Social Connection and Isolation Panel [NHANES]    Frequency of Communication with Friends and Family: More than three times a week    Frequency of Social Gatherings with Friends and Family: More than three times a week    Attends Religious Services: 1 to 4 times per year    Active Member of [employer] or Organizations: Yes    Attends Banker Meetings: 1 to 4 times per year    Marital Status: Married  Catering manager Violence: Not At Risk (05/18/2021)   Humiliation, Afraid, Rape, and Kick questionnaire    Fear of Current or Ex-Partner: No    Emotionally Abused: No    Physically Abused: No    Sexually Abused: No     Current Outpatient Medications:    Cholecalciferol (VITAMIN D3 PO), Take by mouth daily., Disp: , Rfl:    Cranberry-Vitamin C (AZO CRANBERRY URINARY TRACT) 250-60 MG CAPS, Take 1 each by mouth 2 (two) times daily., Disp: 60 capsule, Rfl: 0   donepezil  (ARICEPT) 10 MG tablet, Take 1 tablet (10 mg total) by mouth at bedtime., Disp: 90 tablet, Rfl: 0   glucose blood (ONETOUCH ULTRA) test strip, TEST BLOOD SUGAR THREE TIMES DAILY, Disp: 100 strip, Rfl: 3   hydrOXYzine (ATARAX/VISTARIL) 25 MG tablet, Take 25 mg by mouth 2 (two) times daily. , Disp: , Rfl:    insulin glargine (LANTUS) 100 UNIT/ML injection, Inject 25-35 Units into the skin See admin instructions. 35 units every morning and 25 units at bedtime, Disp: , Rfl:    Insulin Syringe-Needle U-100 (INSULIN SYRINGE .5CC/31GX5/16") 31G X 5/16" 0.5 ML MISC,  USE 1 NEEDLE TWICE DAILY, Disp: 100 each, Rfl: 6   JANUVIA 100 MG tablet, TAKE 1 TABLET BY MOUTH EVERY MORNING, Disp: 90 tablet, Rfl: 3   Lancets (ONETOUCH DELICA PLUS LANCET30G) MISC, USE TO TEST BLOOD SUGAR TWICE DAILY, Disp: 100 each, Rfl: 6   levocetirizine (XYZAL) 5 MG tablet, TAKE 1 TABLET(5 MG) BY MOUTH EVERY EVENING, Disp: 90 tablet, Rfl: 2   levothyroxine (SYNTHROID) 88 MCG tablet, TAKE 1 TABLET BY MOUTH DAILY, Disp: 90 tablet, Rfl: 3   Loperamide HCl (IMODIUM A-D PO), Take by mouth as needed., Disp: , Rfl:    Melatonin 10 MG TABS, Take by mouth at bedtime., Disp: , Rfl:    metFORMIN (GLUCOPHAGE) 500 MG tablet, Take 1 tablet (500 mg total) by mouth 2 (two) times daily., Disp: 180 tablet, Rfl: 3   rosuvastatin (CRESTOR) 10 MG tablet, TAKE 1 TABLET BY MOUTH DAILY, Disp: 90 tablet, Rfl: 3   sertraline (ZOLOFT) 100 MG tablet, TAKE 1 TABLET(100 MG) BY MOUTH TWICE DAILY, Disp: 180 tablet, Rfl: 2   alendronate (FOSAMAX) 70 MG tablet, Take 1 tablet (70 mg total) by mouth once a week. Take with a full glass of water on an empty stomach., Disp: 12 tablet, Rfl: 3   ALPRAZolam (XANAX) 0.5 MG tablet, Take 1 tablet (0.5 mg total) by mouth 2 (two) times daily., Disp: 60 tablet, Rfl: 0   Allergies  Allergen Reactions   Iodine Swelling    ANGIOEDEMA FACIAL SWELLING "BETADINE OKAY"   Shellfish Allergy Anaphylaxis    ROS Review of Systems   Constitutional: Negative.   HENT: Negative.    Eyes: Negative.   Respiratory: Negative.    Cardiovascular: Negative.   Gastrointestinal: Negative.   Endocrine: Negative.   Genitourinary: Negative.   Musculoskeletal: Negative.   Skin: Negative.   Allergic/Immunologic: Negative.   Neurological: Negative.   Hematological: Negative.   Psychiatric/Behavioral: Negative.    All other systems reviewed and are negative.     Objective:    Physical Exam Vitals reviewed.  Constitutional:      Appearance: Normal appearance.  HENT:     Mouth/Throat:     Mouth: Mucous membranes are moist.  Eyes:     Pupils: Pupils are equal, round, and reactive to light.  Neck:     Vascular: No carotid bruit.  Cardiovascular:     Rate and Rhythm: Normal rate and regular rhythm.     Pulses: Normal pulses.     Heart sounds: Normal heart sounds.  Pulmonary:     Effort: Pulmonary effort is normal.     Breath sounds: Normal breath sounds.  Abdominal:     General: Bowel sounds are normal.     Palpations: Abdomen is soft. There is no hepatomegaly, splenomegaly or mass.     Tenderness: There is no abdominal tenderness.     Hernia: No hernia is present.  Musculoskeletal:        General: No tenderness.     Cervical back: Neck supple.     Right lower leg: No edema.     Left lower leg: No edema.     Comments: Pt has osteoporosis  Skin:    Coloration: Skin is not jaundiced.     Findings: No rash.  Neurological:     General: No focal deficit present.     Mental Status: She is alert and oriented to person, place, and time.     Motor: No weakness.  Psychiatric:        Mood  and Affect: Mood and affect normal.        Behavior: Behavior normal.     BP 131/69   Pulse 71   Temp (!) 97.4 F (36.3 C) (Temporal)   Ht 4' 8.5" (1.435 m)   Wt 117 lb 3.2 oz (53.2 kg)   SpO2 99%   BMI 25.81 kg/m  Wt Readings from Last 3 Encounters:  05/27/22 117 lb 3.2 oz (53.2 kg)  04/29/22 109 lb 9.6 oz (49.7 kg)   03/19/22 114 lb 12.8 oz (52.1 kg)     Health Maintenance Due  Topic Date Due   FOOT EXAM  Never done   Diabetic kidney evaluation - Urine ACR  Never done   MAMMOGRAM  Never done   DEXA SCAN  Never done   COVID-19 Vaccine (4 - 2023-24 season) 03/01/2022   Medicare Annual Wellness (AWV)  05/18/2022    There are no preventive care reminders to display for this patient.  Lab Results  Component Value Date   TSH 2.450 05/23/2020   Lab Results  Component Value Date   WBC 5.3 12/10/2021   HGB 12.7 12/10/2021   HCT 38.8 12/10/2021   MCV 90.4 12/10/2021   PLT 85 (L) 12/10/2021   Lab Results  Component Value Date   NA 136 12/10/2021   K 3.8 12/10/2021   CO2 27 12/10/2021   GLUCOSE 142 (H) 12/10/2021   BUN 16 12/10/2021   CREATININE 0.89 12/10/2021   BILITOT 0.4 12/10/2021   ALKPHOS 63 12/10/2021   AST 33 12/10/2021   ALT 19 12/10/2021   PROT 7.2 12/10/2021   ALBUMIN 3.5 12/10/2021   CALCIUM 9.6 12/10/2021   ANIONGAP 6 12/10/2021   Lab Results  Component Value Date   CHOL 92 01/15/2017   Lab Results  Component Value Date   HDL 36 (L) 01/15/2017   Lab Results  Component Value Date   LDLCALC 37 01/15/2017   Lab Results  Component Value Date   TRIG 97 01/15/2017   Lab Results  Component Value Date   CHOLHDL 2.6 01/15/2017   Lab Results  Component Value Date   HGBA1C 6.0 03/19/2022      Assessment & Plan:   Problem List Items Addressed This Visit       Cardiovascular and Mediastinum   Chronic systolic CHF (congestive heart failure) (HCC)    Stable at the present time      Essential hypertension - Primary     Patient denies any chest pain or shortness of breath there is no history of palpitation or paroxysmal nocturnal dyspnea   patient was advised to follow low-salt low-cholesterol diet    ideally I want to keep systolic blood pressure below 540 mmHg, patient was asked to check blood pressure one times a week and give me a report on that.   Patient will be follow-up in 3 months  or earlier as needed, patient will call me back for any change in the cardiovascular symptoms Patient was advised to buy a book from local bookstore concerning blood pressure and read several chapters  every day.  This will be supplemented by some of the material we will give him from the office.  Patient should also utilize other resources like YouTube and Internet to learn more about the blood pressure and the diet.        Endocrine   Type 2 diabetes mellitus with complication, without long-term current use of insulin (HCC)    - The patient's blood sugar  is labile on med. - The patient will continue the current treatment regimen.  - I encouraged the patient to regularly check blood sugar.  - I encouraged the patient to monitor diet. I encouraged the patient to eat low-carb and low-sugar to help prevent blood sugar spikes.  - I encouraged the patient to continue following their prescribed treatment plan for diabetes - I informed the patient to get help if blood sugar drops below 54mg /dL, or if suddenly have trouble thinking clearly or breathing.  Patient was advised to buy a book on diabetes from a local bookstore or from .  Patient should read 2 chapters every day to keep the motivation going, this is in addition to some of the materials we provided them from the office.  There are other resources on the Internet like YouTube and [employer]kipedia to get an education on the diabetes        Nervous and Auditory   Axonal neuropathy    Chronic problem        Musculoskeletal and Integument   Osteoporosis   Relevant Medications   alendronate (FOSAMAX) 70 MG tablet   Rheumatoid arthritis (HCC)    Refer to rheumatology        Other   Anxiety    - Patient experiencing high levels of anxiety.  - Encouraged patient to engage in relaxing activities like yoga, meditation, journaling, going for a walk, or participating in a hobby.  - Encouraged patient  to reach out to trusted friends or family members about recent struggles, Patient was advised to read A book, how to stop worrying and start living, it is good book to read to control  the stress       Relevant Medications   ALPRAZolam (XANAX) 0.5 MG tablet    Meds ordered this encounter  Medications   ALPRAZolam (XANAX) 0.5 MG tablet    Sig: Take 1 tablet (0.5 mg total) by mouth 2 (two) times daily.    Dispense:  60 tablet    Refill:  0   alendronate (FOSAMAX) 70 MG tablet    Sig: Take 1 tablet (70 mg total) by mouth once a week. Take with a full glass of water on an empty stomach.    Dispense:  12 tablet    Refill:  3  Patient was started on Fosamax bone densitometry every 1 to 2 years   (if applicable) and/or at age 14 or older  Consider Vitamin D testing   Calcium intake of 1200 to 1500mg  per day  Vitamin D supplementation with 1000 IU per day  Weight-bearing exercise  Avoidance of smoking/smoking cessation counseling  Bisphosphonate, if indicated per bone densitometry  Follow-up: No follow-ups on file.    76, MD

## 2022-05-27 NOTE — Assessment & Plan Note (Signed)
-   Patient experiencing high levels of anxiety.  - Encouraged patient to engage in relaxing activities like yoga, meditation, journaling, going for a walk, or participating in a hobby.  - Encouraged patient to reach out to trusted friends or family members about recent struggles, Patient was advised to read A book, how to stop worrying and start living, it is good book to read to control  the stress  

## 2022-05-27 NOTE — Assessment & Plan Note (Signed)
Refer to rheumatology  

## 2022-05-27 NOTE — Assessment & Plan Note (Signed)

## 2022-05-27 NOTE — Assessment & Plan Note (Signed)
Chronic problem. 

## 2022-05-29 NOTE — Progress Notes (Unsigned)
No chief complaint on file.   HISTORY OF PRESENT ILLNESS:  05/29/22 ALL: s Jenny [name] returns for follow up for MCI and neuropathy. She was last seen by Dr Frances Furbish and reporting more difficulty with pain in right hand and right knee. She was encouraged to follow up with ortho. She was referred to rheumatology and hematology for concerns of polyclonal gammopathy and RF. She was seen by Dr Allena Katz, rheumatology, 01/2022 and felt she most likely has osteoarthritis. She has follow up 08/2022. She was seen by Dr Donneta Romberg, hem/onc, 11/2021, and workup noted nodule of right upper lobe of lung. She is being monitored closely.   She continues donepezil  daily.   02/26/2021 ALL: Jenny [name] returns for follow up for MCI. She has continued Aricept  daily. She feels that she is doing great. Mellody Dance agrees that her memory seems much sharper. She is able to perform ADLs independently. Mellody Dance still doses medications daily. She does not drive. She is able to pay her bills online. She plays card games on her computer. She continues to have difficulty with irritability. She doesn't understand why her husband asks her questions. She gets frustrated when he talks to her. They have been married for 41 years. She went shopping with her granddaughter last week and didn't have to use her Rolator. She denies falls.   08/24/2020 ALL:  Jenny [name] is a 76 y.o. female here today for follow up for mild neurocognitive impairment due to multiple etiologies including cerebrovascular disease and medication side effects. She was started on donepezil  at last visit with Dr Frances Furbish in 05/2020. Lab work showing positive RF, elevated IgA and AST. Referrals were placed to rheumatology and hematology. Patient has decided not to pursue additional workup. She does feel that Aricept has helped and has tolerated it well.  She feels "smarter". Her husband agrees that she seems to be a little sharper. She is doing more crossword puzzles. She  is reading more. She is wanting to join the gym. She does not drive. She is independent with ADL's. She is sleeping from about 10-8. She has a good appetite. She loves to shop with her son. Anxiety continues to be her biggest concern. She is followed closely by PCP. She continues alprazolam 0.5mg  BID and sertraline  BID.    HISTORY (copied from Dr Teofilo Pod previous note)  Jenny [name] is a 76 year old right-handed woman with an underlying complex medical history of hypothyroidism, diabetes, anxiety, depression, history of panic attacks, osteoporosis, hypothyroidism, vitamin D deficiency, stroke, TIA, aortic stenosis, status post bovine aortic valve replacement in 2018, irritable bowel syndrome, hyperlipidemia, chronic systolic CHF, coronary artery disease, and arthritis, who presents for follow-up consultation of her memory loss and numbness in her feet. The patient is accompanied by her husband again today. She rescheduled an appointment from 05/08/20. I last saw her on 01/04/2020, at which time she felt about the same but her husband had noticed more mood irritability and also more forgetfulness.  She was still struggling with her depression.  She reported numbness and tingling in both feet, no significant pain.  She did have elevated blood sugar values in the evenings.  She reported that her last A1c was less than 7.  We talked about her work-up thus far, EEG was normal in March 2021, neuropsychological evaluation in April 2021 with Dr. Clayborn Heron showed mild neurocognitive disorder that may be multifactorial.  He suspected that she may have minor chronic mild cognitive impairment but no findings  particularly concerning for Alzheimer's dementia at the time.     She was advised to discuss mood related issues including depression with her psychiatrist, she had an upcoming appointment, per husband.   She was advised to proceed with an EMG and nerve conduction velocity test through our office.     She  had an EMG/nerve conduction velocity test on 02/24/2020 and I reviewed the results: IMPRESSION:    This study demonstrates mild axonal sensorimotor polyneuropathy.   We called with the test results.   Today, 05/23/2020: She reports feeling stable, no new issues with her memory as far she is concerned. Her husband feels that she is fairly stable. Mood wise, she also feels stable. Her husband, she did see a psychiatrist a couple of times but he canceled the follow-up appointment. She has not had any new medications.  She continues to take Xanax for anxiety. She reports that she sees her primary care physician for this on a regular basis.   The patient's allergies, current medications, family history, past medical history, past social history, past surgical history and problem list were reviewed and updated as appropriate.      REVIEW OF SYSTEMS: Out of a complete 14 system review of symptoms, the patient complains only of the following symptoms, memory loss, anxiety and all other reviewed systems are negative.    ALLERGIES: Allergies  Allergen Reactions   Iodine Swelling    ANGIOEDEMA FACIAL SWELLING "BETADINE OKAY"   Shellfish Allergy Anaphylaxis     HOME MEDICATIONS: Outpatient Medications Prior to Visit  Medication Sig Dispense Refill   alendronate (FOSAMAX) 70 MG tablet Take 1 tablet (70 mg total) by mouth once a week. Take with a full glass of water on an empty stomach. 12 tablet 3   ALPRAZolam (XANAX) 0.5 MG tablet Take 1 tablet (0.5 mg total) by mouth 2 (two) times daily. 60 tablet 0   Cholecalciferol (VITAMIN D3 PO) Take by mouth daily.     Cranberry-Vitamin C (AZO CRANBERRY URINARY TRACT) 250-60 MG CAPS Take 1 each by mouth 2 (two) times daily. 60 capsule 0   donepezil (ARICEPT) 10 MG tablet Take 1 tablet (10 mg total) by mouth at bedtime. 90 tablet 0   glucose blood (ONETOUCH ULTRA) test strip TEST BLOOD SUGAR THREE TIMES DAILY 100 strip 3   hydrOXYzine (ATARAX/VISTARIL) 25  MG tablet Take 25 mg by mouth 2 (two) times daily.      insulin glargine (LANTUS) 100 UNIT/ML injection Inject 25-35 Units into the skin See admin instructions. 35 units every morning and 25 units at bedtime     Insulin Syringe-Needle U-100 (INSULIN SYRINGE .5CC/31GX5/16") 31G X 5/16" 0.5 ML MISC USE 1 NEEDLE TWICE DAILY 100 each 6   JANUVIA 100 MG tablet TAKE 1 TABLET BY MOUTH EVERY MORNING 90 tablet 3   Lancets (ONETOUCH DELICA PLUS LANCET30G) MISC USE TO TEST BLOOD SUGAR TWICE DAILY 100 each 6   levocetirizine (XYZAL) 5 MG tablet TAKE 1 TABLET(5 MG) BY MOUTH EVERY EVENING 90 tablet 2   levothyroxine (SYNTHROID) 88 MCG tablet TAKE 1 TABLET BY MOUTH DAILY 90 tablet 3   Loperamide HCl (IMODIUM A-D PO) Take by mouth as needed.     Melatonin 10 MG TABS Take by mouth at bedtime.     metFORMIN (GLUCOPHAGE) 500 MG tablet Take 1 tablet (500 mg total) by mouth 2 (two) times daily. 180 tablet 3   rosuvastatin (CRESTOR) 10 MG tablet TAKE 1 TABLET BY MOUTH DAILY 90 tablet 3  sertraline (ZOLOFT) 100 MG tablet TAKE 1 TABLET(100 MG) BY MOUTH TWICE DAILY 180 tablet 2   No facility-administered medications prior to visit.     PAST MEDICAL HISTORY: Past Medical History:  Diagnosis Date   Anxiety    Arthritis    CAD (coronary artery disease)    a. 01/2017 Cath: nonobs dzs.   Chronic systolic CHF (congestive heart failure) (HCC)    a. TTE 12/2016, EF 45-50%, probable hypokinesis of the mid apical anteroseptal, anterior, & apical myocardium, GR2DD, possibly bicuspid aortic valve that was moderately thickened w/ severely calcified leaflets, severe aortic stenosis w/ mean gradient 47 mmHg, valve area 0.52 cm, mild MR, mildly dilated LA   Depression    Diabetes mellitus with complication (HCC)    Tylenol   Hyperlipidemia    Hypothyroidism    IBS (irritable bowel syndrome)    Iron deficiency anemia    a. s/p pRBC x1 in 12/2016   Microcytic anemia    Osteoporosis    Panic attacks    Severe aortic  stenosis    a. TTE 12/2016: possibly bicuspid aortic valve that was moderately thickened with severely calcified leaflets. There was severe aortic stenosis with a mean gradient of 47 mmHg and valve area of 0.52 cm; b. 01/2017 s/p AVR w/ 19 mm bioprosthetic Magna Ease pericardial tissue valve, ser# [id number].   Stroke [facility]) 2001   TIA (transient ischemic attack) 2006     PAST SURGICAL HISTORY: Past Surgical History:  Procedure Laterality Date   ABDOMINAL HYSTERECTOMY     AORTIC VALVE REPLACEMENT N/A 02/17/2017   Procedure: AORTIC VALVE REPLACEMENT (AVR);  Surgeon: Donata Clay, Theron Arista, MD;  Location: [facility] OR;  Service: Open Heart Surgery;  Laterality: N/A;   CATARACT EXTRACTION W/PHACO Right 04/09/2016   Procedure: CATARACT EXTRACTION PHACO AND INTRAOCULAR LENS PLACEMENT (IOC);  Surgeon: Galen Manila, MD;  Location: [facility] ORS;  Service: Ophthalmology;  Laterality: Right;  [place] 57.2 AP% 18.6 CDE 10.64 Fluid Pack lot # [id number]   CATARACT EXTRACTION W/PHACO Left 12/29/2018   Procedure: CATARACT EXTRACTION PHACO AND INTRAOCULAR LENS PLACEMENT (IOC)  LEFT DIABETIC;  Surgeon: Galen Manila, MD;  Location: [facility];  Service: Ophthalmology;  Laterality: Left;  Diabetic - insuli and oral meds   CHOLECYSTECTOMY     COLONOSCOPY     CORONARY ANGIOGRAPHY N/A 02/03/2017   Procedure: CORONARY ANGIOGRAPHY;  Surgeon: Iran Ouch, MD;  Location: [facility] INVASIVE CV LAB;  Service: Cardiovascular;  Laterality: N/A;   FRACTURE SURGERY     LEFT ANKLE   RIGHT AND LEFT HEART CATH Bilateral 02/03/2017   Procedure: Right and Left Heart Cath;  Surgeon: Iran Ouch, MD;  Location: [facility] INVASIVE CV LAB;  Service: Cardiovascular;  Laterality: Bilateral;   TEE WITHOUT CARDIOVERSION N/A 02/17/2017   Procedure: TRANSESOPHAGEAL ECHOCARDIOGRAM (TEE);  Surgeon: Donata Clay, Theron Arista, MD;  Location: [facility] OR;  Service: Open Heart Surgery;  Laterality: N/A;   TIBIA IM NAIL INSERTION Left 05/19/2018   Procedure:  INTRAMEDULLARY (IM) NAIL TIBIAL;  Surgeon: Juanell Fairly, MD;  Location: [facility] ORS;  Service: Orthopedics;  Laterality: Left;     FAMILY HISTORY: Family History  Problem Relation Age of Onset   Hypertension Mother    Parkinson's disease Mother    Diabetes Father    Diabetes Brother    Cancer Maternal Grandmother        unknown   Cancer Maternal Grandfather        unknown   Neuropathy Neg Hx  SOCIAL HISTORY: Social History   Socioeconomic History   Marital status: Married    Spouse name: Not on file   Number of children: 3   Years of education: 12   Highest education level: 12th grade  Occupational History   Occupation: Retired  Tobacco Use   Smoking status: Former    Years: 30.00    Types: Cigarettes    Quit date: 02/04/2000    Years since quitting: 22.3   Smokeless tobacco: Never  Vaping Use   Vaping Use: Never used  Substance and Sexual Activity   Alcohol use: No   Drug use: No   Sexual activity: Not Currently  Other Topics Concern   Not on file  Social History Narrative   Not on file   Social Determinants of Health   Financial Resource Strain: Low Risk  (05/18/2021)   Overall Financial Resource Strain (CARDIA)    Difficulty of Paying Living Expenses: Not hard at all  Food Insecurity: No Food Insecurity (05/18/2021)   Hunger Vital Sign    Worried About Running Out of Food in the Last Year: Never true    Ran Out of Food in the Last Year: Never true  Transportation Needs: No Transportation Needs (05/18/2021)   PRAPARE - Administrator, Civil Service (Medical): No    Lack of Transportation (Non-Medical): No  Physical Activity: Insufficiently Active (05/18/2021)   Exercise Vital Sign    Days of Exercise per Week: 2 days    Minutes of Exercise per Session: 20 min  Stress: No Stress Concern Present (05/18/2021)   [employer] of Occupational Health - Occupational Stress Questionnaire    Feeling of Stress : Only a little  Social  Connections: Socially Integrated (05/18/2021)   Social Connection and Isolation Panel [NHANES]    Frequency of Communication with Friends and Family: More than three times a week    Frequency of Social Gatherings with Friends and Family: More than three times a week    Attends Religious Services: 1 to 4 times per year    Active Member of [employer] or Organizations: Yes    Attends Banker Meetings: 1 to 4 times per year    Marital Status: Married  Catering manager Violence: Not At Risk (05/18/2021)   Humiliation, Afraid, Rape, and Kick questionnaire    Fear of Current or Ex-Partner: No    Emotionally Abused: No    Physically Abused: No    Sexually Abused: No      PHYSICAL EXAM  There were no vitals filed for this visit.   There is no height or weight on file to calculate BMI.   Generalized: Well developed, in no acute distress  Cardiology: normal rate and rhythm, no murmur auscultated  Respiratory: clear to auscultation bilaterally    Neurological examination  Mentation: Alert oriented to time, place, history taking. Follows all commands speech and language fluent Cranial nerve II-XII: Pupils were equal round reactive to light. Extraocular movements were full, visual field were full on confrontational test. Facial sensation and strength were normal. Head turning and shoulder shrug  were normal and symmetric. Motor: The motor testing reveals 5 over 5 strength of all 4 extremities. Good symmetric motor tone is noted throughout.  Sensory: Sensory testing is intact to soft touch on all 4 extremities. No evidence of extinction is noted.  Coordination: Cerebellar testing reveals good finger-nose-finger (very mild right ataxia) and heel-to-shin bilaterally.  Gait and station: Gait is stable with Rolator, Tandem not  attempted     DIAGNOSTIC DATA (LABS, IMAGING, TESTING) - I reviewed patient records, labs, notes, testing and imaging myself where available.  Lab Results   Component Value Date   WBC 5.3 12/10/2021   HGB 12.7 12/10/2021   HCT 38.8 12/10/2021   MCV 90.4 12/10/2021   PLT 85 (L) 12/10/2021      Component Value Date/Time   NA 136 12/10/2021 1447   NA 138 05/23/2020 1514   NA 134 (L) 10/24/2014 0613   K 3.8 12/10/2021 1447   K 4.4 10/24/2014 0613   CL 103 12/10/2021 1447   CL 105 10/24/2014 0613   CO2 27 12/10/2021 1447   CO2 23 10/24/2014 0613   GLUCOSE 142 (H) 12/10/2021 1447   GLUCOSE 293 (H) 10/24/2014 0613   BUN 16 12/10/2021 1447   BUN 10 05/23/2020 1514   BUN 11 10/24/2014 0613   CREATININE 0.89 12/10/2021 1447   CREATININE 0.60 10/24/2014 0613   CALCIUM 9.6 12/10/2021 1447   CALCIUM 8.6 (L) 10/24/2014 0613   PROT 7.2 12/10/2021 1447   PROT 7.2 05/23/2020 1514   PROT 7.7 03/02/2012 0634   ALBUMIN 3.5 12/10/2021 1447   ALBUMIN 3.9 05/23/2020 1514   ALBUMIN 3.2 (L) 03/02/2012 0634   AST 33 12/10/2021 1447   AST 26 03/02/2012 0634   ALT 19 12/10/2021 1447   ALT 21 03/02/2012 0634   ALKPHOS 63 12/10/2021 1447   ALKPHOS 103 03/02/2012 0634   BILITOT 0.4 12/10/2021 1447   BILITOT 0.4 05/23/2020 1514   BILITOT 0.3 03/02/2012 0634   GFRNONAA >60 12/10/2021 1447   GFRNONAA >60 10/24/2014 0613   GFRAA 82 05/23/2020 1514   GFRAA >60 10/24/2014 0613   Lab Results  Component Value Date   CHOL 92 01/15/2017   HDL 36 (L) 01/15/2017   LDLCALC 37 01/15/2017   TRIG 97 01/15/2017   CHOLHDL 2.6 01/15/2017   Lab Results  Component Value Date   HGBA1C 6.0 03/19/2022   Lab Results  Component Value Date   VITAMINB12 933 05/23/2020   Lab Results  Component Value Date   TSH 2.450 05/23/2020       02/26/2021    3:15 PM 08/24/2020    3:04 PM 05/23/2020    2:14 PM  MMSE - Mini Mental State Exam  Orientation to time 5 4 5   Orientation to Place 5 5 5   Registration 3 3 3   Attention/ Calculation 5 1 2   Recall 3 3 3   Language- name 2 objects 2 2 2   Language- repeat 1 1 1   Language- follow 3 step command 3 2 3   Language-  read & follow direction 1 1 1   Write a sentence 1 1 1   Copy design 1 1 1   Total score 30 24 27         10/06/2019    2:00 PM  Montreal Cognitive Assessment   Visuospatial/ Executive (0/5) 3  Naming (0/3) 3  Attention: Read list of digits (0/2) 2  Attention: Read list of letters (0/1) 1  Attention: Serial 7 subtraction starting at 100 (0/3) 1  Language: Repeat phrase (0/2) 1  Language : Fluency (0/1) 0  Abstraction (0/2) 2  Delayed Recall (0/5) 5  Orientation (0/6) 6  Total 24  Adjusted Score (based on education) 25     ASSESSMENT AND PLAN  76 y.o. year old female  has a past medical history of Anxiety, Arthritis, CAD (coronary artery disease), Chronic systolic CHF (congestive heart failure) (HCC), Depression,  Diabetes mellitus with complication (HCC), Hyperlipidemia, Hypothyroidism, IBS (irritable bowel syndrome), Iron deficiency anemia, Microcytic anemia, Osteoporosis, Panic attacks, Severe aortic stenosis, Stroke (HCC) (2001), and TIA (transient ischemic attack) (2006). here with   Mild vascular neurocognitive disorder  Memory loss  Axonal neuropathy  Polyclonal gammopathy  Rheumatoid factor positive  Selam feels that Aricept has been very helpful. MMSE 30/30, today. I do feel that there is some cognitive deficits that are most likely multifactorial. She is very talkative and easily distracted. She will continue Aricept 10mg  daily. I have encouraged her to continue working closely with her PCP. Unclear if she is still seeing psychiatry but I do feel it would be helpful. She will continue healthy lifestyle habits. Memory compensation strategies reviewed. She will return to see me in 1 year, sooner if needed. She and her husband verbalize understanding and agreement with this plan.   No orders of the defined types were placed in this encounter.     No orders of the defined types were placed in this encounter.    , MSN, FNP-C 05/29/2022, 12:52 PM  [facility] [address] [place], [place] [phone]

## 2022-05-29 NOTE — Patient Instructions (Signed)
Below is our plan:  We will continue Aricept 10mg  daily.   Please make sure you are staying well hydrated. I recommend 50-60 ounces daily. Well balanced diet and regular exercise encouraged. Consistent sleep schedule with 6-8 hours recommended.   Please continue follow up with care team as directed.   Follow up with me in 6 months   You may receive a survey regarding today's visit. I encourage you to leave honest feed back as I do use this information to improve patient care. Thank you for seeing me today!   Management of Memory Problems   There are some general things you can do to help manage your memory problems.  Your memory may not in fact recover, but by using techniques and strategies you will be able to manage your memory difficulties better.   1)  Establish a routine. Try to establish and then stick to a regular routine.  By doing this, you will get used to what to expect and you will reduce the need to rely on your memory.  Also, try to do things at the same time of day, such as taking your medication or checking your calendar first thing in the morning. Think about think that you can do as a part of a regular routine and make a list.  Then enter them into a daily planner to remind you.  This will help you establish a routine.   2)  Organize your environment. Organize your environment so that it is uncluttered.  Decrease visual stimulation.  Place everyday items such as keys or cell phone in the same place every day (ie.  Basket next to front door) Use post it notes with a brief message to yourself (ie. Turn off light, lock the door) Use labels to indicate where things go (ie. Which cupboards are for food, dishes, etc.) Keep a notepad and pen by the telephone to take messages   3)  Memory Aids A diary or journal/notebook/daily planner Making a list (shopping list, chore list, to do list that needs to be done) Using an alarm as a reminder (kitchen timer or cell phone  alarm) Using cell phone to store information (Notes, Calendar, Reminders) Calendar/White board placed in a prominent position Post-it notes   In order for memory aids to be useful, you need to have good habits.  It's no good remembering to make a note in your journal if you don't remember to look in it.  Try setting aside a certain time of day to look in journal.   4)  Improving mood and managing fatigue. There may be other factors that contribute to memory difficulties.  Factors, such as anxiety, depression and tiredness can affect memory. Regular gentle exercise can help improve your mood and give you more energy. Exercise: there are short videos created by the on Health specially for older adults: https://bit.ly/2I30q97.  Mediterranean diet: which emphasizes fruits, vegetables, whole grains, legumes, fish, and other seafood; unsaturated fats such as olive oils; and low amounts of red meat, eggs, and sweets. A variation of this, called MIND (Mediterranean-DASH Intervention for Neurodegenerative Delay) incorporates the DASH (Dietary Approaches to Stop Hypertension) diet, which has been shown to lower high blood pressure, a risk factor for Alzheimer's disease. More information at: [employer].  Aerobic exercise that improve heart health is also good for the mind.  [employer] on Aging have short videos for exercises that you can do at home: [employer] Simple relaxation techniques may help relieve symptoms of  anxiety Try to get back to completing activities or hobbies you enjoyed doing in the past. Learn to pace yourself through activities to decrease fatigue. Find out about some local support groups where you can share experiences with others. Try and achieve 7-8 hours of sleep at night.

## 2022-05-30 ENCOUNTER — Encounter: Payer: Self-pay | Admitting: Family Medicine

## 2022-05-30 ENCOUNTER — Ambulatory Visit (INDEPENDENT_AMBULATORY_CARE_PROVIDER_SITE_OTHER): Payer: Medicare Other | Admitting: Family Medicine

## 2022-05-30 VITALS — BP 131/65 | HR 67 | Ht <= 58 in | Wt 114.5 lb

## 2022-05-30 DIAGNOSIS — R768 Other specified abnormal immunological findings in serum: Secondary | ICD-10-CM

## 2022-05-30 DIAGNOSIS — R413 Other amnesia: Secondary | ICD-10-CM

## 2022-05-30 DIAGNOSIS — F0670 Mild neurocognitive disorder due to known physiological condition without behavioral disturbance: Secondary | ICD-10-CM | POA: Diagnosis not present

## 2022-05-30 DIAGNOSIS — D890 Polyclonal hypergammaglobulinemia: Secondary | ICD-10-CM

## 2022-05-30 DIAGNOSIS — G6289 Other specified polyneuropathies: Secondary | ICD-10-CM | POA: Diagnosis not present

## 2022-05-30 DIAGNOSIS — I999 Unspecified disorder of circulatory system: Secondary | ICD-10-CM

## 2022-05-30 MED ORDER — DONEPEZIL HCL 10 MG PO TABS: 10.0000 mg | ORAL_TABLET | Freq: Every day | ORAL | 3 refills | Status: DC

## 2022-06-04 ENCOUNTER — Telehealth: Payer: Self-pay | Admitting: *Deleted

## 2022-06-04 NOTE — Patient Outreach (Signed)
  Care Coordination   06/04/2022 Name: JABRIA LOOS MRN: [id number] DOB: Dec 18, 1945   Care Coordination Outreach Attempts:  An unsuccessful telephone outreach was attempted today to offer the patient information about available care coordination services as a benefit of their health plan.   Follow Up Plan:  Additional outreach attempts will be made to offer the patient care coordination information and services.   Encounter Outcome:  No Answer   Care Coordination Interventions:  No, not indicated    SIG  Gean Maidens BSN RN [facility] [phone]

## 2022-06-12 ENCOUNTER — Ambulatory Visit: Payer: Medicare Other | Admitting: Internal Medicine

## 2022-06-26 ENCOUNTER — Other Ambulatory Visit: Payer: Self-pay | Admitting: Internal Medicine

## 2022-07-02 ENCOUNTER — Other Ambulatory Visit: Payer: Self-pay | Admitting: Internal Medicine

## 2022-07-03 ENCOUNTER — Encounter: Payer: Self-pay | Admitting: Internal Medicine

## 2022-07-03 ENCOUNTER — Ambulatory Visit (INDEPENDENT_AMBULATORY_CARE_PROVIDER_SITE_OTHER): Payer: Medicare Other | Admitting: Internal Medicine

## 2022-07-03 VITALS — BP 124/66 | HR 72 | Ht <= 58 in | Wt 119.3 lb

## 2022-07-03 DIAGNOSIS — E038 Other specified hypothyroidism: Secondary | ICD-10-CM | POA: Diagnosis not present

## 2022-07-03 DIAGNOSIS — F419 Anxiety disorder, unspecified: Secondary | ICD-10-CM

## 2022-07-03 DIAGNOSIS — E118 Type 2 diabetes mellitus with unspecified complications: Secondary | ICD-10-CM

## 2022-07-03 DIAGNOSIS — K589 Irritable bowel syndrome without diarrhea: Secondary | ICD-10-CM | POA: Diagnosis not present

## 2022-07-03 DIAGNOSIS — I10 Essential (primary) hypertension: Secondary | ICD-10-CM | POA: Diagnosis not present

## 2022-07-03 DIAGNOSIS — E063 Autoimmune thyroiditis: Secondary | ICD-10-CM

## 2022-07-03 DIAGNOSIS — M069 Rheumatoid arthritis, unspecified: Secondary | ICD-10-CM

## 2022-07-03 MED ORDER — INSULIN GLARGINE 100 UNIT/ML ~~LOC~~ SOLN: 25.0000 [IU] | SUBCUTANEOUS | 3 refills | Status: DC

## 2022-07-03 MED ORDER — ALPRAZOLAM 0.5 MG PO TABS: 0.5000 mg | ORAL_TABLET | Freq: Two times a day (BID) | ORAL | 0 refills | Status: DC

## 2022-07-03 NOTE — Assessment & Plan Note (Signed)
She is still having intermittent diarrhea

## 2022-07-03 NOTE — Assessment & Plan Note (Signed)
He was advised to check blood sugar every day and give me a report for a month

## 2022-07-03 NOTE — Progress Notes (Signed)
Established Patient Office Visit  Subjective:  Patient ID: Jenny [name], female    DOB: 1945-12-26  Age: 77 y.o. MRN: [id number]  CC:  Chief Complaint  Patient presents with   Medication Refill    xanax    Medication Refill    Jenny [name] presents for check up  Past Medical History:  Diagnosis Date   Anxiety    Arthritis    CAD (coronary artery disease)    a. 01/2017 Cath: nonobs dzs.   Chronic systolic CHF (congestive heart failure) ([place])    a. TTE 12/2016, EF 45-50%, probable hypokinesis of the mid apical anteroseptal, anterior, & apical myocardium, GR2DD, possibly bicuspid aortic valve that was moderately thickened w/ severely calcified leaflets, severe aortic stenosis w/ mean gradient 47 mmHg, valve area 0.52 cm, mild MR, mildly dilated LA   Depression    Diabetes mellitus with complication (HCC)    Tylenol   Hyperlipidemia    Hypothyroidism    IBS (irritable bowel syndrome)    Iron deficiency anemia    a. s/p pRBC x1 in 12/2016   Microcytic anemia    Osteoporosis    Panic attacks    Severe aortic stenosis    a. TTE 12/2016: possibly bicuspid aortic valve that was moderately thickened with severely calcified leaflets. There was severe aortic stenosis with a mean gradient of 47 mmHg and valve area of 0.52 cm; b. 01/2017 s/p AVR w/ 19 mm bioprosthetic Magna Ease pericardial tissue valve, ser# [id number].   Stroke [facility]) 2001   TIA (transient ischemic attack) 2006    Past Surgical History:  Procedure Laterality Date   ABDOMINAL HYSTERECTOMY     AORTIC VALVE REPLACEMENT N/A 02/17/2017   Procedure: AORTIC VALVE REPLACEMENT (AVR);  Surgeon: Prescott Gum, Collier Salina, MD;  Location: [place];  Service: Open Heart Surgery;  Laterality: N/A;   CATARACT EXTRACTION W/PHACO Right 04/09/2016   Procedure: CATARACT EXTRACTION PHACO AND INTRAOCULAR LENS PLACEMENT ([place]);  Surgeon: Birder Robson, MD;  Location: [facility];  Service: Ophthalmology;  Laterality: Right;  [place] 57.2 AP%  18.6 CDE 10.64 Fluid Pack lot # [id number]   CATARACT EXTRACTION W/PHACO Left 12/29/2018   Procedure: CATARACT EXTRACTION PHACO AND INTRAOCULAR LENS PLACEMENT ([place])  LEFT DIABETIC;  Surgeon: Birder Robson, MD;  Location: [place];  Service: Ophthalmology;  Laterality: Left;  Diabetic - insuli and oral meds   CHOLECYSTECTOMY     COLONOSCOPY     CORONARY ANGIOGRAPHY N/A 02/03/2017   Procedure: CORONARY ANGIOGRAPHY;  Surgeon: Wellington Hampshire, MD;  Location: [place] [facility];  Service: Cardiovascular;  Laterality: N/A;   FRACTURE SURGERY     LEFT ANKLE   RIGHT AND LEFT HEART CATH Bilateral 02/03/2017   Procedure: Right and Left Heart Cath;  Surgeon: Wellington Hampshire, MD;  Location: [place] [facility];  Service: Cardiovascular;  Laterality: Bilateral;   TEE WITHOUT CARDIOVERSION N/A 02/17/2017   Procedure: TRANSESOPHAGEAL ECHOCARDIOGRAM (TEE);  Surgeon: Prescott Gum, Collier Salina, MD;  Location: [place];  Service: Open Heart Surgery;  Laterality: N/A;   TIBIA IM NAIL INSERTION Left 05/19/2018   Procedure: INTRAMEDULLARY (IM) NAIL TIBIAL;  Surgeon: Thornton Park, MD;  Location: [facility];  Service: Orthopedics;  Laterality: Left;    Family History  Problem Relation Age of Onset   Hypertension Mother    Parkinson's disease Mother    Alzheimer's disease Mother    Diabetes Father    Diabetes Brother    Cancer Maternal Grandmother  unknown   Cancer Maternal Grandfather        unknown   Neuropathy Neg Hx     Social History   Socioeconomic History   Marital status: Married    Spouse name: Not on file   Number of children: 3   Years of education: 12   Highest education level: 12th grade  Occupational History   Occupation: Retired  Tobacco Use   Smoking status: Former    Years: 30.00    Types: Cigarettes    Quit date: 02/04/2000    Years since quitting: 22.4   Smokeless tobacco: Never  Vaping Use   Vaping Use: Never used  Substance and Sexual Activity   Alcohol use:  No   Drug use: No   Sexual activity: Not Currently  Other Topics Concern   Not on file  Social History Narrative   Not on file   Social Determinants of Health   Financial Resource Strain: Low Risk  (05/18/2021)   Overall Financial Resource Strain (CARDIA)    Difficulty of Paying Living Expenses: Not hard at all  Food Insecurity: No Food Insecurity (05/18/2021)   Hunger Vital Sign    Worried About Running Out of Food in the Last Year: Never true    [place] in the Last Year: Never true  Transportation Needs: No Transportation Needs (05/18/2021)   PRAPARE - Hydrologist (Medical): No    Lack of Transportation (Non-Medical): No  Physical Activity: Insufficiently Active (05/18/2021)   Exercise Vital Sign    Days of Exercise per Week: 2 days    Minutes of Exercise per Session: 20 min  Stress: No Stress Concern Present (05/18/2021)   [place]    Feeling of Stress : Only a little  Social Connections: Socially Integrated (05/18/2021)   Social Connection and Isolation Panel [NHANES]    Frequency of Communication with Friends and Family: More than three times a week    Frequency of Social Gatherings with Friends and Family: More than three times a week    Attends Religious Services: 1 to 4 times per year    Active Member of [employer] or Organizations: Yes    Attends Archivist Meetings: 1 to 4 times per year    Marital Status: Married  Human resources officer Violence: Not At Risk (05/18/2021)   Humiliation, Afraid, Rape, and Kick questionnaire    Fear of Current or Ex-Partner: No    Emotionally Abused: No    Physically Abused: No    Sexually Abused: No     Current Outpatient Medications:    alendronate (FOSAMAX) 70 MG tablet, Take 1 tablet (70 mg total) by mouth once a week. Take with a full glass of water on an empty stomach., Disp: 12 tablet, Rfl: 3   Cholecalciferol (VITAMIN  D3 PO), Take by mouth daily., Disp: , Rfl:    Cranberry-Vitamin C (AZO CRANBERRY URINARY TRACT) 250-60 MG CAPS, Take 1 each by mouth 2 (two) times daily., Disp: 60 capsule, Rfl: 0   donepezil (ARICEPT) 10 MG tablet, Take 1 tablet (10 mg total) by mouth at bedtime., Disp: 90 tablet, Rfl: 3   glucose blood (ONETOUCH ULTRA) test strip, TEST BLOOD SUGAR THREE TIMES DAILY, Disp: 100 strip, Rfl: 3   hydrOXYzine (ATARAX/VISTARIL) 25 MG tablet, Take 25 mg by mouth 2 (two) times daily. , Disp: , Rfl:    Insulin Syringe-Needle U-100 (INSULIN SYRINGE .5CC/31GX5/16") 31G X  5/16" 0.5 ML MISC, USE 1 NEEDLE TWICE DAILY, Disp: 100 each, Rfl: 6   Lancets (ONETOUCH DELICA PLUS [id number]) MISC, USE TO TEST BLOOD SUGAR TWICE DAILY, Disp: 100 each, Rfl: 6   levocetirizine (XYZAL) 5 MG tablet, TAKE 1 TABLET(5 MG) BY MOUTH EVERY EVENING, Disp: 90 tablet, Rfl: 2   levothyroxine (SYNTHROID) 88 MCG tablet, TAKE 1 TABLET BY MOUTH DAILY, Disp: 90 tablet, Rfl: 3   Loperamide HCl (IMODIUM A-D PO), Take by mouth as needed., Disp: , Rfl:    Melatonin 10 MG TABS, Take by mouth at bedtime., Disp: , Rfl:    metFORMIN (GLUCOPHAGE) 500 MG tablet, Take 1 tablet (500 mg total) by mouth 2 (two) times daily., Disp: 180 tablet, Rfl: 3   rosuvastatin (CRESTOR) 10 MG tablet, TAKE 1 TABLET BY MOUTH DAILY, Disp: 90 tablet, Rfl: 3   sertraline (ZOLOFT) 100 MG tablet, TAKE 1 TABLET(100 MG) BY MOUTH TWICE DAILY, Disp: 180 tablet, Rfl: 2   sitaGLIPtin (JANUVIA) 100 MG tablet, TAKE 1 TABLET BY MOUTH EVERY MORNING, Disp: 90 tablet, Rfl: 1   ALPRAZolam (XANAX) 0.5 MG tablet, Take 1 tablet (0.5 mg total) by mouth 2 (two) times daily., Disp: 60 tablet, Rfl: 0   insulin glargine (LANTUS) 100 UNIT/ML injection, Inject 0.25-0.35 mLs (25-35 Units total) into the skin See admin instructions. 35 units every morning and 25 units at bedtime, Disp: 10 mL, Rfl: 3   Allergies  Allergen Reactions   Iodine Swelling    ANGIOEDEMA FACIAL SWELLING "BETADINE OKAY"    Shellfish Allergy Anaphylaxis    ROS Review of Systems  Constitutional: Negative.   HENT: Negative.    Eyes: Negative.   Respiratory: Negative.    Cardiovascular: Negative.   Gastrointestinal: Negative.   Endocrine: Negative.   Genitourinary: Negative.   Musculoskeletal: Negative.   Skin: Negative.   Allergic/Immunologic: Negative.   Neurological: Negative.   Hematological: Negative.   Psychiatric/Behavioral: Negative.    All other systems reviewed and are negative.     Objective:    Physical Exam Vitals reviewed.  Constitutional:      Appearance: Normal appearance.  HENT:     Mouth/Throat:     Mouth: Mucous membranes are moist.  Eyes:     Pupils: Pupils are equal, round, and reactive to light.  Neck:     Vascular: No carotid bruit.  Cardiovascular:     Rate and Rhythm: Normal rate and regular rhythm.     Pulses: Normal pulses.     Heart sounds: Normal heart sounds.  Pulmonary:     Effort: Pulmonary effort is normal.     Breath sounds: Normal breath sounds.  Abdominal:     General: Bowel sounds are normal.     Palpations: Abdomen is soft. There is no hepatomegaly, splenomegaly or mass.     Tenderness: There is no abdominal tenderness.     Hernia: No hernia is present.  Musculoskeletal:        General: No tenderness.     Cervical back: Neck supple.     Right lower leg: No edema.     Left lower leg: No edema.  Skin:    Findings: No rash.  Neurological:     Mental Status: She is alert and oriented to person, place, and time.     Motor: No weakness.  Psychiatric:        Mood and Affect: Mood and affect normal.        Behavior: Behavior normal.     BP  124/66   Pulse 72   Ht 4\' 8"  (1.422 m)   Wt 119 lb 4.8 oz (54.1 kg)   SpO2 94%   BMI 26.75 kg/m  Wt Readings from Last 3 Encounters:  07/03/22 119 lb 4.8 oz (54.1 kg)  05/30/22 114 lb 8 oz (51.9 kg)  05/27/22 117 lb 3.2 oz (53.2 kg)     Health Maintenance Due  Topic Date Due   FOOT EXAM   Never done   OPHTHALMOLOGY EXAM  Never done   Diabetic kidney evaluation - Urine ACR  Never done   MAMMOGRAM  Never done   DEXA SCAN  Never done   COVID-19 Vaccine (4 - 2023-24 season) 03/01/2022   Medicare Annual Wellness (AWV)  05/18/2022    There are no preventive care reminders to display for this patient.  Lab Results  Component Value Date   TSH 2.450 05/23/2020   Lab Results  Component Value Date   WBC 5.3 12/10/2021   HGB 12.7 12/10/2021   HCT 38.8 12/10/2021   MCV 90.4 12/10/2021   PLT 85 (L) 12/10/2021   Lab Results  Component Value Date   NA 136 12/10/2021   K 3.8 12/10/2021   CO2 27 12/10/2021   GLUCOSE 142 (H) 12/10/2021   BUN 16 12/10/2021   CREATININE 0.89 12/10/2021   BILITOT 0.4 12/10/2021   ALKPHOS 63 12/10/2021   AST 33 12/10/2021   ALT 19 12/10/2021   PROT 7.2 12/10/2021   ALBUMIN 3.5 12/10/2021   CALCIUM 9.6 12/10/2021   ANIONGAP 6 12/10/2021   Lab Results  Component Value Date   CHOL 92 01/15/2017   Lab Results  Component Value Date   HDL 36 (L) 01/15/2017   Lab Results  Component Value Date   LDLCALC 37 01/15/2017   Lab Results  Component Value Date   TRIG 97 01/15/2017   Lab Results  Component Value Date   CHOLHDL 2.6 01/15/2017   Lab Results  Component Value Date   HGBA1C 6.0 03/19/2022      Assessment & Plan:   Problem List Items Addressed This Visit       Cardiovascular and Mediastinum   Essential hypertension - Primary     Patient denies any chest pain or shortness of breath there is no history of palpitation or paroxysmal nocturnal dyspnea   patient was advised to follow low-salt low-cholesterol diet    ideally I want to keep systolic blood pressure below 130 mmHg, patient was asked to check blood pressure one times a week and give me a report on that.  Patient will be follow-up in 3 months  or earlier as needed, patient will call me back for any change in the cardiovascular symptoms Patient was advised to buy a  book from local bookstore concerning blood pressure and read several chapters  every day.  This will be supplemented by some of the material we will give him from the office.  Patient should also utilize other resources like YouTube and Internet to learn more about the blood pressure and the diet.        Digestive   Irritable bowel syndrome    She is still having intermittent diarrhea        Endocrine   Type 2 diabetes mellitus with complication, without long-term current use of insulin ([place])    He was advised to check blood sugar every day and give me a report for a month      Relevant Medications  insulin glargine (LANTUS) 100 UNIT/ML injection   Hypothyroidism     Musculoskeletal and Integument   Rheumatoid arthritis (HCC)    Under control        Other   Anxiety    - Patient experiencing high levels of anxiety.  - Encouraged patient to engage in relaxing activities like yoga, meditation, journaling, going for a walk, or participating in a hobby.  - Encouraged patient to reach out to trusted friends or family members about recent struggles, Patient was advised to read A book, how to stop worrying and start living, it is good book to read to control  the stress       Relevant Medications   ALPRAZolam (XANAX) 0.5 MG tablet    Meds ordered this encounter  Medications   ALPRAZolam (XANAX) 0.5 MG tablet    Sig: Take 1 tablet (0.5 mg total) by mouth 2 (two) times daily.    Dispense:  60 tablet    Refill:  0   insulin glargine (LANTUS) 100 UNIT/ML injection    Sig: Inject 0.25-0.35 mLs (25-35 Units total) into the skin See admin instructions. 35 units every morning and 25 units at bedtime    Dispense:  10 mL    Refill:  3    Follow-up: No follow-ups on file.    Cletis Athens, MD

## 2022-07-03 NOTE — Assessment & Plan Note (Signed)
Under control 

## 2022-07-03 NOTE — Assessment & Plan Note (Signed)

## 2022-07-03 NOTE — Assessment & Plan Note (Signed)
-   Patient experiencing high levels of anxiety.  - Encouraged patient to engage in relaxing activities like yoga, meditation, journaling, going for a walk, or participating in a hobby.  - Encouraged patient to reach out to trusted friends or family members about recent struggles, Patient was advised to read A book, how to stop worrying and start living, it is good book to read to control  the stress  

## 2022-07-19 ENCOUNTER — Other Ambulatory Visit: Payer: Medicare Other

## 2022-07-19 ENCOUNTER — Ambulatory Visit: Payer: Medicare Other | Admitting: Internal Medicine

## 2022-08-05 ENCOUNTER — Other Ambulatory Visit: Payer: Self-pay | Admitting: Internal Medicine

## 2022-08-05 DIAGNOSIS — F419 Anxiety disorder, unspecified: Secondary | ICD-10-CM

## 2022-08-12 ENCOUNTER — Inpatient Hospital Stay: Payer: Medicare Other

## 2022-08-12 ENCOUNTER — Encounter: Payer: Self-pay | Admitting: Internal Medicine

## 2022-08-12 ENCOUNTER — Inpatient Hospital Stay: Payer: Medicare Other | Attending: Internal Medicine | Admitting: Internal Medicine

## 2022-08-12 VITALS — BP 164/68 | HR 77 | Temp 97.5°F | Resp 16 | Ht 59.06 in | Wt 117.9 lb

## 2022-08-12 DIAGNOSIS — Z87891 Personal history of nicotine dependence: Secondary | ICD-10-CM | POA: Insufficient documentation

## 2022-08-12 DIAGNOSIS — D890 Polyclonal hypergammaglobulinemia: Secondary | ICD-10-CM

## 2022-08-12 DIAGNOSIS — G629 Polyneuropathy, unspecified: Secondary | ICD-10-CM | POA: Diagnosis not present

## 2022-08-12 DIAGNOSIS — M2550 Pain in unspecified joint: Secondary | ICD-10-CM | POA: Insufficient documentation

## 2022-08-12 DIAGNOSIS — Z79899 Other long term (current) drug therapy: Secondary | ICD-10-CM | POA: Diagnosis not present

## 2022-08-12 DIAGNOSIS — R911 Solitary pulmonary nodule: Secondary | ICD-10-CM

## 2022-08-12 LAB — CBC WITH DIFFERENTIAL/PLATELET
Abs Immature Granulocytes: 0.01 10*3/uL (ref 0.00–0.07)
Basophils Absolute: 0 10*3/uL (ref 0.0–0.1)
Basophils Relative: 1 %
Eosinophils Absolute: 0.2 10*3/uL (ref 0.0–0.5)
Eosinophils Relative: 5 %
HCT: 38.1 % (ref 36.0–46.0)
Hemoglobin: 12.5 g/dL (ref 12.0–15.0)
Immature Granulocytes: 0 %
Lymphocytes Relative: 25 %
Lymphs Abs: 1 10*3/uL (ref 0.7–4.0)
MCH: 29.2 pg (ref 26.0–34.0)
MCHC: 32.8 g/dL (ref 30.0–36.0)
MCV: 89 fL (ref 80.0–100.0)
Monocytes Absolute: 0.3 10*3/uL (ref 0.1–1.0)
Monocytes Relative: 7 %
Neutro Abs: 2.5 10*3/uL (ref 1.7–7.7)
Neutrophils Relative %: 62 %
Platelets: 106 10*3/uL — ABNORMAL LOW (ref 150–400)
RBC: 4.28 MIL/uL (ref 3.87–5.11)
RDW: 14.7 % (ref 11.5–15.5)
WBC: 4 10*3/uL (ref 4.0–10.5)
nRBC: 0 % (ref 0.0–0.2)

## 2022-08-12 LAB — COMPREHENSIVE METABOLIC PANEL
ALT: 18 U/L (ref 0–44)
AST: 35 U/L (ref 15–41)
Albumin: 3.9 g/dL (ref 3.5–5.0)
Alkaline Phosphatase: 64 U/L (ref 38–126)
Anion gap: 10 (ref 5–15)
BUN: 11 mg/dL (ref 8–23)
CO2: 26 mmol/L (ref 22–32)
Calcium: 9.5 mg/dL (ref 8.9–10.3)
Chloride: 101 mmol/L (ref 98–111)
Creatinine, Ser: 0.77 mg/dL (ref 0.44–1.00)
GFR, Estimated: >60 mL/min (ref >60)
Glucose, Bld: 118 mg/dL — ABNORMAL HIGH (ref 70–99)
Potassium: 4.1 mmol/L (ref 3.5–5.1)
Sodium: 137 mmol/L (ref 135–145)
Total Bilirubin: 0.5 mg/dL (ref 0.3–1.2)
Total Protein: 8.1 g/dL (ref 6.5–8.1)

## 2022-08-12 NOTE — Progress Notes (Signed)
Patient denies new problems/concerns today.   

## 2022-08-12 NOTE — Assessment & Plan Note (Addendum)
#   POLYCLONAL GAMMOPATHY- [2022-NOV]-the etiology is unclear however suspect underlying inflammation especially given patient's joint pains/muscle pain.  July 2023-multiple myeloma panel negative; however kappa lambda light chain ratio slightly abnormal-2.35.  Monitor for now/repeat labs in 6 months.  # weight loss- July 2023 chest x-ray-chronic interstitial changes question opacity in the right upper lobe.  July 2023 CT scan abdomen pelvis with contrast-negative for any acute process;  CT AUG 2023- Subpleural and basilar predominant fibrotic interstitial lung disease with traction bronchiectasis but no honeycomb change.  Recommend follow-up with PCP/pulmonary.  Given a copy of the CT scan.  #Chronic mild to moderate thrombocytopenia-question ITP versus others.  CT abdomen/pelvis July 20-negative for cirrhosis/megaly.  # PN [id number]- [facility]/[place]-neurology];  Mild dementia/congnitive- ? -defer to Neurology, Dr.Athar.   #Incidental findings on Imaging  CT, 2024: 3. Aortic Atherosclerosis ; Emphysema (ICD10-J43.9).I reviewed/discussed/counseled the patient.   Print CT scan of chest of aug, 2023 #  DISPOSITION: # follow up in  6 month  MD; labs- cbc/cmp; MM panel- K/l light chains-   Dr.B  # I reviewed the blood work- with the patient in detail; also reviewed the imaging independently [as summarized above]; and with the patient in detail.   Cc; Dr.Masoud    Dr.Athar; Dr.Masoud

## 2022-08-12 NOTE — Progress Notes (Signed)
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  #Question dementia; moderate to severe peripheral neuropathy;   HISTORY OF PRESENTING ILLNESS: walks with rolling walker; with husband. Poor historian.   Jenny [name] 77 y.o.  female with multiple medical problems including possible dementia neuropathy is here to follow-up the results of her work-up ordered at last visit.  Patient denies new problems/concerns today. Patient history of chronic neuropathy of the bilateral lower extremities.    Patient continues to complain of joint pains.  Otherwise denies any cough or shortness of breath.  Review of Systems  Constitutional:  Positive for malaise/fatigue and weight loss. Negative for chills, diaphoresis and fever.  HENT:  Negative for nosebleeds and sore throat.   Eyes:  Negative for double vision.  Respiratory:  Negative for cough, hemoptysis, sputum production, shortness of breath and wheezing.   Cardiovascular:  Negative for chest pain, palpitations, orthopnea and leg swelling.  Gastrointestinal:  Negative for abdominal pain, blood in stool, constipation, diarrhea, heartburn, melena, nausea and vomiting.  Genitourinary:  Negative for dysuria, frequency and urgency.  Musculoskeletal:  Positive for back pain and joint pain.  Skin: Negative.  Negative for itching and rash.  Neurological:  Positive for tingling. Negative for dizziness, focal weakness, weakness and headaches.  Endo/Heme/Allergies:  Does not  bruise/bleed easily.  Psychiatric/Behavioral:  Negative for depression. The patient is not nervous/anxious and does not have insomnia.     MEDICAL HISTORY:  Past Medical History:  Diagnosis Date   Anxiety    Arthritis    CAD (coronary artery disease)    a. 01/2017 Cath: nonobs dzs.   Chronic systolic CHF (congestive heart failure) ([facility])    a. TTE 12/2016, EF 45-50%, probable hypokinesis of the mid apical anteroseptal, anterior, & apical myocardium, GR2DD, possibly bicuspid aortic valve that was moderately thickened w/ severely calcified leaflets, severe aortic stenosis w/ mean gradient 47 mmHg, valve area 0.52 cm, mild MR, mildly dilated LA   Depression    Diabetes mellitus with complication (HCC)    Tylenol   Hyperlipidemia    Hypothyroidism    IBS (irritable bowel syndrome)    Iron deficiency anemia    a. s/p pRBC x1 in 12/2016   Microcytic anemia    Osteoporosis    Panic attacks    Severe aortic stenosis    a. TTE 12/2016: possibly bicuspid aortic valve that was moderately thickened with severely calcified leaflets. There was severe aortic stenosis with a mean gradient of 47 mmHg and valve area of 0.52 cm; b. 01/2017 s/p AVR w/ 19 mm bioprosthetic Magna Ease pericardial tissue valve, ser# [id number].   Stroke [facility]) 2001   TIA (transient ischemic attack) 2006    SURGICAL HISTORY: Past Surgical History:  Procedure Laterality Date   ABDOMINAL HYSTERECTOMY     AORTIC VALVE REPLACEMENT N/A 02/17/2017   Procedure: AORTIC VALVE REPLACEMENT (AVR);  Surgeon: Prescott Gum, Collier Salina, MD;  Location: [facility];  Service: Open Heart Surgery;  Laterality: N/A;   CATARACT EXTRACTION W/PHACO Right 04/09/2016   Procedure: CATARACT EXTRACTION PHACO AND INTRAOCULAR LENS PLACEMENT ([place]);  Surgeon: Birder Robson, MD;  Location: [facility];  Service: Ophthalmology;  Laterality: Right;  [place] 57.2 AP% 18.6 CDE 10.64 Fluid Pack lot # [id number]   CATARACT EXTRACTION W/PHACO Left 12/29/2018   Procedure: CATARACT  EXTRACTION PHACO AND INTRAOCULAR LENS PLACEMENT ([place])  LEFT DIABETIC;  Surgeon: Birder Robson, MD;  Location: [facility];  Service: Ophthalmology;  Laterality: Left;  Diabetic - insuli and oral meds   CHOLECYSTECTOMY     COLONOSCOPY     CORONARY ANGIOGRAPHY N/A 02/03/2017   Procedure: CORONARY ANGIOGRAPHY;  Surgeon: Wellington Hampshire, MD;  Location: [facility];  Service: Cardiovascular;  Laterality: N/A;   FRACTURE SURGERY     LEFT ANKLE   RIGHT AND LEFT HEART CATH Bilateral 02/03/2017   Procedure: Right and Left Heart Cath;  Surgeon: Wellington Hampshire, MD;  Location: [place] [facility];  Service: Cardiovascular;  Laterality: Bilateral;   TEE WITHOUT CARDIOVERSION N/A 02/17/2017   Procedure: TRANSESOPHAGEAL ECHOCARDIOGRAM (TEE);  Surgeon: Prescott Gum, Collier Salina, MD;  Location: [place];  Service: Open Heart Surgery;  Laterality: N/A;   TIBIA IM NAIL INSERTION Left 05/19/2018   Procedure: INTRAMEDULLARY (IM) NAIL TIBIAL;  Surgeon: Thornton Park, MD;  Location: [facility];  Service: Orthopedics;  Laterality: Left;    SOCIAL HISTORY: Social History   Socioeconomic History   Marital status: Married    Spouse name: Not on file   Number of children: 3   Years of education: 12   Highest education level: 12th grade  Occupational History   Occupation: Retired  Tobacco Use   Smoking status: Former    Years: 30.00    Types: Cigarettes    Quit date: 02/04/2000    Years since quitting: 22.5   Smokeless tobacco: Never  Vaping Use   Vaping Use: Never used  Substance and Sexual Activity   Alcohol use: No   Drug use: No   Sexual activity: Not Currently  Other Topics Concern   Not on file  Social History Narrative   Not on file   Social Determinants of Health   Financial Resource Strain: Low Risk  (05/18/2021)   Overall Financial Resource Strain (CARDIA)    Difficulty of Paying Living Expenses: Not hard at all  Food Insecurity: No Food Insecurity (05/18/2021)   Hunger Vital  Sign    Worried About Running Out of Food in the Last Year: Never true    [place] in the Last Year: Never true  Transportation Needs: No Transportation Needs (05/18/2021)   PRAPARE - Hydrologist (Medical): No    Lack of Transportation (Non-Medical): No  Physical Activity: Insufficiently Active (05/18/2021)   Exercise Vital Sign    Days of Exercise per Week: 2 days    Minutes of Exercise per Session: 20 min  Stress: No Stress Concern Present (05/18/2021)   [place]    Feeling of Stress : Only a little  Social Connections: Socially Integrated (05/18/2021)   Social Connection and Isolation Panel [NHANES]    Frequency of Communication with Friends and Family: More than three times a week    Frequency of Social Gatherings with Friends and Family: More than three times a week    Attends Religious Services: 1 to 4 times per year    Active Member of [employer] or Organizations: Yes    Attends Archivist Meetings: 1 to 4 times per year    Marital Status: Married  Human resources officer Violence:  Not At Risk (05/18/2021)   Humiliation, Afraid, Rape, and Kick questionnaire    Fear of Current or Ex-Partner: No    Emotionally Abused: No    Physically Abused: No    Sexually Abused: No    FAMILY HISTORY: Family History  Problem Relation Age of Onset   Hypertension Mother    Parkinson's disease Mother    Alzheimer's disease Mother    Diabetes Father    Diabetes Brother    Cancer Maternal Grandmother        unknown   Cancer Maternal Grandfather        unknown   Neuropathy Neg Hx     ALLERGIES:  is allergic to iodine and shellfish allergy.  MEDICATIONS:  Current Outpatient Medications  Medication Sig Dispense Refill   alendronate (FOSAMAX) 70 MG tablet Take 1 tablet (70 mg total) by mouth once a week. Take with a full glass of water on an empty stomach. 12 tablet 3   ALPRAZolam  (XANAX) 0.5 MG tablet Take 1 tablet (0.5 mg total) by mouth 2 (two) times daily. 60 tablet 0   Cholecalciferol (VITAMIN D3 PO) Take by mouth daily.     Cranberry-Vitamin C (AZO CRANBERRY URINARY TRACT) 250-60 MG CAPS Take 1 each by mouth 2 (two) times daily. 60 capsule 0   donepezil (ARICEPT) 10 MG tablet Take 1 tablet (10 mg total) by mouth at bedtime. 90 tablet 3   glucose blood (ONETOUCH ULTRA) test strip TEST BLOOD SUGAR THREE TIMES DAILY 100 strip 3   hydrOXYzine (ATARAX/VISTARIL) 25 MG tablet Take 25 mg by mouth 2 (two) times daily.      insulin glargine (LANTUS) 100 UNIT/ML injection Inject 0.25-0.35 mLs (25-35 Units total) into the skin See admin instructions. 35 units every morning and 25 units at bedtime 10 mL 3   Insulin Syringe-Needle U-100 (INSULIN SYRINGE .5CC/31GX5/16") 31G X 5/16" 0.5 ML MISC USE 1 NEEDLE TWICE DAILY 100 each 6   Lancets (ONETOUCH DELICA PLUS Q000111Q) MISC USE TO TEST BLOOD SUGAR TWICE DAILY 100 each 6   levocetirizine (XYZAL) 5 MG tablet TAKE 1 TABLET(5 MG) BY MOUTH EVERY EVENING 90 tablet 2   levothyroxine (SYNTHROID) 88 MCG tablet TAKE 1 TABLET BY MOUTH DAILY 90 tablet 3   Loperamide HCl (IMODIUM A-D PO) Take by mouth as needed.     Melatonin 10 MG TABS Take by mouth at bedtime.     metFORMIN (GLUCOPHAGE) 500 MG tablet Take 1 tablet (500 mg total) by mouth 2 (two) times daily. 180 tablet 3   rosuvastatin (CRESTOR) 10 MG tablet TAKE 1 TABLET BY MOUTH DAILY 90 tablet 3   sertraline (ZOLOFT) 100 MG tablet TAKE 1 TABLET(100 MG) BY MOUTH TWICE DAILY 180 tablet 2   sitaGLIPtin (JANUVIA) 100 MG tablet TAKE 1 TABLET BY MOUTH EVERY MORNING 90 tablet 1   No current facility-administered medications for this visit.      PHYSICAL EXAMINATION:   Vitals:   08/12/22 1500  BP: (!) 164/68  Pulse: 77  Resp: 16  Temp: (!) 97.5 F (36.4 C)   Filed Weights   08/12/22 1500  Weight: 117 lb 14.4 oz (53.5 kg)    Physical Exam Vitals and nursing note reviewed.   HENT:     Head: Normocephalic and atraumatic.     Mouth/Throat:     Pharynx: Oropharynx is clear.  Eyes:     Extraocular Movements: Extraocular movements intact.     Pupils: Pupils are equal, round, and reactive to light.  Cardiovascular:     Rate and Rhythm: Normal rate and regular rhythm.  Pulmonary:     Comments: Decreased breath sounds bilaterally.  Abdominal:     Palpations: Abdomen is soft.  Musculoskeletal:        General: Normal range of motion.     Cervical back: Normal range of motion.  Skin:    General: Skin is warm.  Neurological:     General: No focal deficit present.     Mental Status: She is alert and oriented to person, place, and time.  Psychiatric:        Behavior: Behavior normal.        Judgment: Judgment normal.     LABORATORY DATA:  I have reviewed the data as listed Lab Results  Component Value Date   WBC 4.0 08/12/2022   HGB 12.5 08/12/2022   HCT 38.1 08/12/2022   MCV 89.0 08/12/2022   PLT 106 (L) 08/12/2022   Recent Labs    12/10/21 1447 08/12/22 1516  NA 136 137  K 3.8 4.1  CL 103 101  CO2 27 26  GLUCOSE 142* 118*  BUN 16 11  CREATININE 0.89 0.77  CALCIUM 9.6 9.5  GFRNONAA >60 >60  PROT 7.2 8.1  ALBUMIN 3.5 3.9  AST 33 35  ALT 19 18  ALKPHOS 63 64  BILITOT 0.4 0.5     No results found.  Lab Results  Component Value Date   KPAFRELGTCHN 73.6 (H) 12/10/2021   LAMBDASER 31.3 (H) 12/10/2021   KAPLAMBRATIO 2.35 (H) 12/10/2021     Polyclonal gammopathy # POLYCLONAL GAMMOPATHY- [2022-NOV]-the etiology is unclear however suspect underlying inflammation especially given patient's joint pains/muscle pain.  July 2023-multiple myeloma panel negative; however kappa lambda light chain ratio slightly abnormal-2.35.  Monitor for now/repeat labs in 6 months.  # weight loss- July 2023 chest x-ray-chronic interstitial changes question opacity in the right upper lobe.  July 2023 CT scan abdomen pelvis with contrast-negative for any acute  process;  CT AUG 2023- Subpleural and basilar predominant fibrotic interstitial lung disease with traction bronchiectasis but no honeycomb change.  Recommend follow-up with PCP/pulmonary.  Given a copy of the CT scan.  #Chronic mild to moderate thrombocytopenia-question ITP versus others.  CT abdomen/pelvis July 20-negative for cirrhosis/megaly.  # PN [id number]- [facility]/[place]-neurology];  Mild dementia/congnitive- ? -defer to Neurology, Dr.Athar.   #Incidental findings on Imaging  CT, 2024: 3. Aortic Atherosclerosis ; Emphysema (ICD10-J43.9).I reviewed/discussed/counseled the patient.   Print CT scan of chest of aug, 2023 #  DISPOSITION: # follow up in  6 month  MD; labs- cbc/cmp; MM panel- K/l light chains-   Dr.B  # I reviewed the blood work- with the patient in detail; also reviewed the imaging independently [as summarized above]; and with the patient in detail.   Cc; Dr.Masoud    Dr.Athar; Dr.Masoud      All questions were answered. The patient knows to call the clinic with any problems, questions or concerns.      Cammie Sickle, MD 08/12/2022 3:57 PM

## 2022-08-13 LAB — KAPPA/LAMBDA LIGHT CHAINS
Kappa free light chain: 83.1 mg/L — ABNORMAL HIGH (ref 3.3–19.4)
Kappa, lambda light chain ratio: 2.13 — ABNORMAL HIGH (ref 0.26–1.65)
Lambda free light chains: 39 mg/L — ABNORMAL HIGH (ref 5.7–26.3)

## 2022-08-20 LAB — MULTIPLE MYELOMA PANEL, SERUM
Albumin SerPl Elph-Mcnc: 3.8 g/dL (ref 2.9–4.4)
Albumin/Glob SerPl: 1.2 (ref 0.7–1.7)
Alpha 1: 0.3 g/dL (ref 0.0–0.4)
Alpha2 Glob SerPl Elph-Mcnc: 0.7 g/dL (ref 0.4–1.0)
B-Globulin SerPl Elph-Mcnc: 1 g/dL (ref 0.7–1.3)
Gamma Glob SerPl Elph-Mcnc: 1.4 g/dL (ref 0.4–1.8)
Globulin, Total: 3.3 g/dL (ref 2.2–3.9)
IgA: 367 mg/dL (ref 64–422)
IgG (Immunoglobin G), Serum: 1261 mg/dL (ref 586–1602)
IgM (Immunoglobulin M), Srm: 160 mg/dL (ref 26–217)
Total Protein ELP: 7.1 g/dL (ref 6.0–8.5)

## 2022-10-07 DIAGNOSIS — M810 Age-related osteoporosis without current pathological fracture: Secondary | ICD-10-CM | POA: Diagnosis not present

## 2022-10-07 DIAGNOSIS — I10 Essential (primary) hypertension: Secondary | ICD-10-CM | POA: Diagnosis not present

## 2022-10-07 DIAGNOSIS — M0520 Rheumatoid vasculitis with rheumatoid arthritis of unspecified site: Secondary | ICD-10-CM | POA: Diagnosis not present

## 2022-10-07 DIAGNOSIS — F419 Anxiety disorder, unspecified: Secondary | ICD-10-CM | POA: Diagnosis not present

## 2022-11-11 DIAGNOSIS — E039 Hypothyroidism, unspecified: Secondary | ICD-10-CM | POA: Diagnosis not present

## 2022-11-11 DIAGNOSIS — R413 Other amnesia: Secondary | ICD-10-CM | POA: Diagnosis not present

## 2022-11-11 DIAGNOSIS — M0520 Rheumatoid vasculitis with rheumatoid arthritis of unspecified site: Secondary | ICD-10-CM | POA: Diagnosis not present

## 2022-11-11 DIAGNOSIS — F419 Anxiety disorder, unspecified: Secondary | ICD-10-CM | POA: Diagnosis not present

## 2022-11-11 DIAGNOSIS — E119 Type 2 diabetes mellitus without complications: Secondary | ICD-10-CM | POA: Diagnosis not present

## 2022-11-11 DIAGNOSIS — G20A1 Parkinson's disease without dyskinesia, without mention of fluctuations: Secondary | ICD-10-CM | POA: Diagnosis not present

## 2022-12-10 ENCOUNTER — Encounter: Payer: Self-pay | Admitting: Family Medicine

## 2022-12-10 ENCOUNTER — Ambulatory Visit: Payer: Medicare Other | Admitting: Family Medicine

## 2022-12-10 ENCOUNTER — Ambulatory Visit (INDEPENDENT_AMBULATORY_CARE_PROVIDER_SITE_OTHER): Payer: Medicare Other | Admitting: Family Medicine

## 2022-12-10 VITALS — BP 120/56 | HR 75 | Ht <= 58 in | Wt 102.0 lb

## 2022-12-10 DIAGNOSIS — I999 Unspecified disorder of circulatory system: Secondary | ICD-10-CM

## 2022-12-10 DIAGNOSIS — F0670 Mild neurocognitive disorder due to known physiological condition without behavioral disturbance: Secondary | ICD-10-CM

## 2022-12-10 DIAGNOSIS — F339 Major depressive disorder, recurrent, unspecified: Secondary | ICD-10-CM | POA: Diagnosis not present

## 2022-12-10 DIAGNOSIS — D890 Polyclonal hypergammaglobulinemia: Secondary | ICD-10-CM

## 2022-12-10 NOTE — Patient Instructions (Signed)
Below is our plan:  We will continue donepezil 10mg  daily. Please consider seeing a counselor.   Please make sure you are staying well hydrated. I recommend 50-60 ounces daily. Well balanced diet and regular exercise encouraged. Consistent sleep schedule with 6-8 hours recommended.   Please continue follow up with care team as directed.   Follow up with me in 6 months   You may receive a survey regarding today's visit. I encourage you to leave honest feed back as I do use this information to improve patient care. Thank you for seeing me today!   Management of Memory Problems   There are some general things you can do to help manage your memory problems.  Your memory may not in fact recover, but by using techniques and strategies you will be able to manage your memory difficulties better.   1)  Establish a routine. Try to establish and then stick to a regular routine.  By doing this, you will get used to what to expect and you will reduce the need to rely on your memory.  Also, try to do things at the same time of day, such as taking your medication or checking your calendar first thing in the morning. Think about think that you can do as a part of a regular routine and make a list.  Then enter them into a daily planner to remind you.  This will help you establish a routine.   2)  Organize your environment. Organize your environment so that it is uncluttered.  Decrease visual stimulation.  Place everyday items such as keys or cell phone in the same place every day (ie.  Basket next to front door) Use post it notes with a brief message to yourself (ie. Turn off light, lock the door) Use labels to indicate where things go (ie. Which cupboards are for food, dishes, etc.) Keep a notepad and pen by the telephone to take messages   3)  Memory Aids A diary or journal/notebook/daily planner Making a list (shopping list, chore list, to do list that needs to be done) Using an alarm as a reminder  (kitchen timer or cell phone alarm) Using cell phone to store information (Notes, Calendar, Reminders) Calendar/White board placed in a prominent position Post-it notes   In order for memory aids to be useful, you need to have good habits.  It's no good remembering to make a note in your journal if you don't remember to look in it.  Try setting aside a certain time of day to look in journal.   4)  Improving mood and managing fatigue. There may be other factors that contribute to memory difficulties.  Factors, such as anxiety, depression and tiredness can affect memory. Regular gentle exercise can help improve your mood and give you more energy. Exercise: there are short videos created by the [employer] on Health specially for older adults: https://bit.ly/2I30q97.  Mediterranean diet: which emphasizes fruits, vegetables, whole grains, legumes, fish, and other seafood; unsaturated fats such as olive oils; and low amounts of red meat, eggs, and sweets. A variation of this, called MIND (Mediterranean-DASH Intervention for Neurodegenerative Delay) incorporates the DASH (Dietary Approaches to Stop Hypertension) diet, which has been shown to lower high blood pressure, a risk factor for Alzheimer's disease. More information at: ExitMarketing.de.  Aerobic exercise that improve heart health is also good for the mind.  [employer] on Aging have short videos for exercises that you can do at home: BlindWorkshop.com.pt Simple relaxation techniques  may help relieve symptoms of anxiety Try to get back to completing activities or hobbies you enjoyed doing in the past. Learn to pace yourself through activities to decrease fatigue. Find out about some local support groups where you can share experiences with others. Try and achieve 7-8 hours of sleep at night.
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
## 2022-12-11 ENCOUNTER — Telehealth: Payer: Self-pay | Admitting: Family Medicine

## 2022-12-11 NOTE — Telephone Encounter (Signed)
Referral faxed to [facility]: Phone: [phone]  Fax: [phone]

## 2022-12-20 DIAGNOSIS — M0520 Rheumatoid vasculitis with rheumatoid arthritis of unspecified site: Secondary | ICD-10-CM | POA: Diagnosis not present

## 2022-12-20 DIAGNOSIS — F419 Anxiety disorder, unspecified: Secondary | ICD-10-CM | POA: Diagnosis not present

## 2022-12-20 DIAGNOSIS — M810 Age-related osteoporosis without current pathological fracture: Secondary | ICD-10-CM | POA: Diagnosis not present

## 2022-12-20 DIAGNOSIS — R413 Other amnesia: Secondary | ICD-10-CM | POA: Diagnosis not present

## 2022-12-20 DIAGNOSIS — E119 Type 2 diabetes mellitus without complications: Secondary | ICD-10-CM | POA: Diagnosis not present

## 2022-12-25 DIAGNOSIS — F54 Psychological and behavioral factors associated with disorders or diseases classified elsewhere: Secondary | ICD-10-CM | POA: Diagnosis not present

## 2023-01-08 DIAGNOSIS — F54 Psychological and behavioral factors associated with disorders or diseases classified elsewhere: Secondary | ICD-10-CM | POA: Diagnosis not present

## 2023-01-09 ENCOUNTER — Telehealth: Payer: Self-pay | Admitting: Family Medicine

## 2023-01-09 NOTE — Telephone Encounter (Signed)
Reviewed notes from Elsie Lincoln, psychology. CBT and weaning of alprazolam advised. Will follow up as scheduled.

## 2023-01-20 DIAGNOSIS — G20A1 Parkinson's disease without dyskinesia, without mention of fluctuations: Secondary | ICD-10-CM | POA: Diagnosis not present

## 2023-01-20 DIAGNOSIS — R413 Other amnesia: Secondary | ICD-10-CM | POA: Diagnosis not present

## 2023-01-20 DIAGNOSIS — M0520 Rheumatoid vasculitis with rheumatoid arthritis of unspecified site: Secondary | ICD-10-CM | POA: Diagnosis not present

## 2023-01-20 DIAGNOSIS — E119 Type 2 diabetes mellitus without complications: Secondary | ICD-10-CM | POA: Diagnosis not present

## 2023-01-21 DIAGNOSIS — F4321 Adjustment disorder with depressed mood: Secondary | ICD-10-CM | POA: Diagnosis not present

## 2023-01-21 DIAGNOSIS — F54 Psychological and behavioral factors associated with disorders or diseases classified elsewhere: Secondary | ICD-10-CM | POA: Diagnosis not present

## 2023-01-26 ENCOUNTER — Other Ambulatory Visit: Payer: Self-pay | Admitting: Nurse Practitioner

## 2023-01-26 DIAGNOSIS — F419 Anxiety disorder, unspecified: Secondary | ICD-10-CM

## 2023-01-27 ENCOUNTER — Other Ambulatory Visit: Payer: Self-pay | Admitting: Nurse Practitioner

## 2023-01-27 DIAGNOSIS — F419 Anxiety disorder, unspecified: Secondary | ICD-10-CM

## 2023-01-29 ENCOUNTER — Encounter: Payer: Self-pay | Admitting: Family Medicine

## 2023-02-10 ENCOUNTER — Inpatient Hospital Stay: Payer: Medicare Other | Admitting: Internal Medicine

## 2023-02-10 ENCOUNTER — Inpatient Hospital Stay: Payer: Medicare Other

## 2023-03-17 DIAGNOSIS — G20A1 Parkinson's disease without dyskinesia, without mention of fluctuations: Secondary | ICD-10-CM | POA: Diagnosis not present

## 2023-03-17 DIAGNOSIS — R350 Frequency of micturition: Secondary | ICD-10-CM | POA: Diagnosis not present

## 2023-03-17 DIAGNOSIS — E119 Type 2 diabetes mellitus without complications: Secondary | ICD-10-CM | POA: Diagnosis not present

## 2023-03-17 DIAGNOSIS — N390 Urinary tract infection, site not specified: Secondary | ICD-10-CM | POA: Diagnosis not present

## 2023-03-17 DIAGNOSIS — M0520 Rheumatoid vasculitis with rheumatoid arthritis of unspecified site: Secondary | ICD-10-CM | POA: Diagnosis not present

## 2023-04-21 DIAGNOSIS — R413 Other amnesia: Secondary | ICD-10-CM | POA: Diagnosis not present

## 2023-04-21 DIAGNOSIS — F419 Anxiety disorder, unspecified: Secondary | ICD-10-CM | POA: Diagnosis not present

## 2023-04-21 DIAGNOSIS — M0520 Rheumatoid vasculitis with rheumatoid arthritis of unspecified site: Secondary | ICD-10-CM | POA: Diagnosis not present

## 2023-04-21 DIAGNOSIS — I10 Essential (primary) hypertension: Secondary | ICD-10-CM | POA: Diagnosis not present

## 2023-05-19 ENCOUNTER — Encounter: Payer: Self-pay | Admitting: *Deleted

## 2023-05-19 ENCOUNTER — Other Ambulatory Visit: Payer: Self-pay

## 2023-05-19 ENCOUNTER — Observation Stay: Payer: Medicare Other

## 2023-05-19 ENCOUNTER — Inpatient Hospital Stay
Admission: EM | Admit: 2023-05-19 | Discharge: 2023-07-02 | DRG: 871 | Disposition: E | Payer: Medicare Other | Attending: Osteopathic Medicine | Admitting: Osteopathic Medicine

## 2023-05-19 ENCOUNTER — Emergency Department: Payer: Medicare Other

## 2023-05-19 DIAGNOSIS — Z7983 Long term (current) use of bisphosphonates: Secondary | ICD-10-CM

## 2023-05-19 DIAGNOSIS — R112 Nausea with vomiting, unspecified: Secondary | ICD-10-CM | POA: Diagnosis present

## 2023-05-19 DIAGNOSIS — T380X5A Adverse effect of glucocorticoids and synthetic analogues, initial encounter: Secondary | ICD-10-CM | POA: Diagnosis present

## 2023-05-19 DIAGNOSIS — I342 Nonrheumatic mitral (valve) stenosis: Secondary | ICD-10-CM | POA: Diagnosis not present

## 2023-05-19 DIAGNOSIS — J9621 Acute and chronic respiratory failure with hypoxia: Secondary | ICD-10-CM | POA: Diagnosis not present

## 2023-05-19 DIAGNOSIS — J8410 Pulmonary fibrosis, unspecified: Secondary | ICD-10-CM | POA: Diagnosis not present

## 2023-05-19 DIAGNOSIS — R0602 Shortness of breath: Secondary | ICD-10-CM | POA: Diagnosis not present

## 2023-05-19 DIAGNOSIS — Z91013 Allergy to seafood: Secondary | ICD-10-CM

## 2023-05-19 DIAGNOSIS — R0689 Other abnormalities of breathing: Secondary | ICD-10-CM | POA: Diagnosis not present

## 2023-05-19 DIAGNOSIS — I350 Nonrheumatic aortic (valve) stenosis: Secondary | ICD-10-CM | POA: Diagnosis not present

## 2023-05-19 DIAGNOSIS — R0902 Hypoxemia: Secondary | ICD-10-CM | POA: Diagnosis not present

## 2023-05-19 DIAGNOSIS — Z953 Presence of xenogenic heart valve: Secondary | ICD-10-CM

## 2023-05-19 DIAGNOSIS — I214 Non-ST elevation (NSTEMI) myocardial infarction: Secondary | ICD-10-CM

## 2023-05-19 DIAGNOSIS — M069 Rheumatoid arthritis, unspecified: Secondary | ICD-10-CM | POA: Diagnosis present

## 2023-05-19 DIAGNOSIS — J690 Pneumonitis due to inhalation of food and vomit: Secondary | ICD-10-CM | POA: Diagnosis not present

## 2023-05-19 DIAGNOSIS — F32A Depression, unspecified: Secondary | ICD-10-CM | POA: Diagnosis present

## 2023-05-19 DIAGNOSIS — R7989 Other specified abnormal findings of blood chemistry: Secondary | ICD-10-CM | POA: Diagnosis not present

## 2023-05-19 DIAGNOSIS — I050 Rheumatic mitral stenosis: Secondary | ICD-10-CM | POA: Diagnosis not present

## 2023-05-19 DIAGNOSIS — E872 Acidosis, unspecified: Secondary | ICD-10-CM | POA: Diagnosis present

## 2023-05-19 DIAGNOSIS — E039 Hypothyroidism, unspecified: Secondary | ICD-10-CM | POA: Diagnosis present

## 2023-05-19 DIAGNOSIS — A4159 Other Gram-negative sepsis: Secondary | ICD-10-CM | POA: Diagnosis not present

## 2023-05-19 DIAGNOSIS — Z8249 Family history of ischemic heart disease and other diseases of the circulatory system: Secondary | ICD-10-CM

## 2023-05-19 DIAGNOSIS — E11649 Type 2 diabetes mellitus with hypoglycemia without coma: Secondary | ICD-10-CM | POA: Diagnosis present

## 2023-05-19 DIAGNOSIS — J188 Other pneumonia, unspecified organism: Secondary | ICD-10-CM | POA: Diagnosis not present

## 2023-05-19 DIAGNOSIS — I340 Nonrheumatic mitral (valve) insufficiency: Secondary | ICD-10-CM | POA: Diagnosis not present

## 2023-05-19 DIAGNOSIS — A4189 Other specified sepsis: Secondary | ICD-10-CM | POA: Diagnosis not present

## 2023-05-19 DIAGNOSIS — I371 Nonrheumatic pulmonary valve insufficiency: Secondary | ICD-10-CM | POA: Diagnosis present

## 2023-05-19 DIAGNOSIS — J90 Pleural effusion, not elsewhere classified: Secondary | ICD-10-CM | POA: Diagnosis not present

## 2023-05-19 DIAGNOSIS — I517 Cardiomegaly: Secondary | ICD-10-CM | POA: Diagnosis not present

## 2023-05-19 DIAGNOSIS — K7460 Unspecified cirrhosis of liver: Secondary | ICD-10-CM

## 2023-05-19 DIAGNOSIS — F419 Anxiety disorder, unspecified: Secondary | ICD-10-CM | POA: Diagnosis present

## 2023-05-19 DIAGNOSIS — Z515 Encounter for palliative care: Secondary | ICD-10-CM

## 2023-05-19 DIAGNOSIS — I2510 Atherosclerotic heart disease of native coronary artery without angina pectoris: Secondary | ICD-10-CM | POA: Diagnosis present

## 2023-05-19 DIAGNOSIS — R0989 Other specified symptoms and signs involving the circulatory and respiratory systems: Secondary | ICD-10-CM | POA: Diagnosis not present

## 2023-05-19 DIAGNOSIS — Z7189 Other specified counseling: Secondary | ICD-10-CM | POA: Diagnosis not present

## 2023-05-19 DIAGNOSIS — D649 Anemia, unspecified: Secondary | ICD-10-CM | POA: Diagnosis not present

## 2023-05-19 DIAGNOSIS — E785 Hyperlipidemia, unspecified: Secondary | ICD-10-CM | POA: Diagnosis present

## 2023-05-19 DIAGNOSIS — R7401 Elevation of levels of liver transaminase levels: Secondary | ICD-10-CM | POA: Diagnosis not present

## 2023-05-19 DIAGNOSIS — K580 Irritable bowel syndrome with diarrhea: Secondary | ICD-10-CM | POA: Diagnosis present

## 2023-05-19 DIAGNOSIS — J9601 Acute respiratory failure with hypoxia: Secondary | ICD-10-CM | POA: Diagnosis not present

## 2023-05-19 DIAGNOSIS — Z471 Aftercare following joint replacement surgery: Secondary | ICD-10-CM | POA: Diagnosis not present

## 2023-05-19 DIAGNOSIS — R64 Cachexia: Secondary | ICD-10-CM | POA: Diagnosis present

## 2023-05-19 DIAGNOSIS — E876 Hypokalemia: Secondary | ICD-10-CM | POA: Diagnosis present

## 2023-05-19 DIAGNOSIS — Z888 Allergy status to other drugs, medicaments and biological substances status: Secondary | ICD-10-CM

## 2023-05-19 DIAGNOSIS — J157 Pneumonia due to Mycoplasma pneumoniae: Secondary | ICD-10-CM | POA: Diagnosis present

## 2023-05-19 DIAGNOSIS — R000 Tachycardia, unspecified: Secondary | ICD-10-CM | POA: Diagnosis not present

## 2023-05-19 DIAGNOSIS — J9691 Respiratory failure, unspecified with hypoxia: Secondary | ICD-10-CM | POA: Diagnosis not present

## 2023-05-19 DIAGNOSIS — A419 Sepsis, unspecified organism: Principal | ICD-10-CM | POA: Diagnosis present

## 2023-05-19 DIAGNOSIS — R6521 Severe sepsis with septic shock: Secondary | ICD-10-CM | POA: Diagnosis not present

## 2023-05-19 DIAGNOSIS — I10 Essential (primary) hypertension: Secondary | ICD-10-CM | POA: Diagnosis present

## 2023-05-19 DIAGNOSIS — R52 Pain, unspecified: Secondary | ICD-10-CM

## 2023-05-19 DIAGNOSIS — E669 Obesity, unspecified: Secondary | ICD-10-CM | POA: Diagnosis present

## 2023-05-19 DIAGNOSIS — I708 Atherosclerosis of other arteries: Secondary | ICD-10-CM | POA: Diagnosis not present

## 2023-05-19 DIAGNOSIS — I700 Atherosclerosis of aorta: Secondary | ICD-10-CM | POA: Diagnosis not present

## 2023-05-19 DIAGNOSIS — I2489 Other forms of acute ischemic heart disease: Secondary | ICD-10-CM | POA: Diagnosis present

## 2023-05-19 DIAGNOSIS — R54 Age-related physical debility: Secondary | ICD-10-CM | POA: Diagnosis present

## 2023-05-19 DIAGNOSIS — Z452 Encounter for adjustment and management of vascular access device: Secondary | ICD-10-CM | POA: Diagnosis not present

## 2023-05-19 DIAGNOSIS — G8929 Other chronic pain: Secondary | ICD-10-CM | POA: Diagnosis present

## 2023-05-19 DIAGNOSIS — Z79899 Other long term (current) drug therapy: Secondary | ICD-10-CM

## 2023-05-19 DIAGNOSIS — D509 Iron deficiency anemia, unspecified: Secondary | ICD-10-CM | POA: Diagnosis present

## 2023-05-19 DIAGNOSIS — Z833 Family history of diabetes mellitus: Secondary | ICD-10-CM

## 2023-05-19 DIAGNOSIS — Z90710 Acquired absence of both cervix and uterus: Secondary | ICD-10-CM

## 2023-05-19 DIAGNOSIS — E871 Hypo-osmolality and hyponatremia: Secondary | ICD-10-CM | POA: Diagnosis not present

## 2023-05-19 DIAGNOSIS — R1110 Vomiting, unspecified: Secondary | ICD-10-CM | POA: Diagnosis not present

## 2023-05-19 DIAGNOSIS — I959 Hypotension, unspecified: Secondary | ICD-10-CM | POA: Diagnosis not present

## 2023-05-19 DIAGNOSIS — Z7984 Long term (current) use of oral hypoglycemic drugs: Secondary | ICD-10-CM

## 2023-05-19 DIAGNOSIS — J849 Interstitial pulmonary disease, unspecified: Secondary | ICD-10-CM | POA: Diagnosis not present

## 2023-05-19 DIAGNOSIS — N170 Acute kidney failure with tubular necrosis: Secondary | ICD-10-CM | POA: Diagnosis present

## 2023-05-19 DIAGNOSIS — Z87891 Personal history of nicotine dependence: Secondary | ICD-10-CM

## 2023-05-19 DIAGNOSIS — Z820 Family history of epilepsy and other diseases of the nervous system: Secondary | ICD-10-CM

## 2023-05-19 DIAGNOSIS — R918 Other nonspecific abnormal finding of lung field: Secondary | ICD-10-CM | POA: Diagnosis not present

## 2023-05-19 DIAGNOSIS — E43 Unspecified severe protein-calorie malnutrition: Secondary | ICD-10-CM | POA: Diagnosis not present

## 2023-05-19 DIAGNOSIS — J984 Other disorders of lung: Secondary | ICD-10-CM | POA: Diagnosis not present

## 2023-05-19 DIAGNOSIS — R590 Localized enlarged lymph nodes: Secondary | ICD-10-CM | POA: Diagnosis not present

## 2023-05-19 DIAGNOSIS — Z952 Presence of prosthetic heart valve: Secondary | ICD-10-CM | POA: Diagnosis not present

## 2023-05-19 DIAGNOSIS — D696 Thrombocytopenia, unspecified: Secondary | ICD-10-CM | POA: Diagnosis present

## 2023-05-19 DIAGNOSIS — Z9049 Acquired absence of other specified parts of digestive tract: Secondary | ICD-10-CM

## 2023-05-19 DIAGNOSIS — J189 Pneumonia, unspecified organism: Secondary | ICD-10-CM | POA: Diagnosis not present

## 2023-05-19 DIAGNOSIS — Z7982 Long term (current) use of aspirin: Secondary | ICD-10-CM

## 2023-05-19 DIAGNOSIS — R188 Other ascites: Secondary | ICD-10-CM | POA: Diagnosis present

## 2023-05-19 DIAGNOSIS — R140 Abdominal distension (gaseous): Secondary | ICD-10-CM | POA: Diagnosis not present

## 2023-05-19 DIAGNOSIS — Z794 Long term (current) use of insulin: Secondary | ICD-10-CM

## 2023-05-19 DIAGNOSIS — Z6821 Body mass index (BMI) 21.0-21.9, adult: Secondary | ICD-10-CM

## 2023-05-19 DIAGNOSIS — R29818 Other symptoms and signs involving the nervous system: Secondary | ICD-10-CM | POA: Diagnosis not present

## 2023-05-19 DIAGNOSIS — Z66 Do not resuscitate: Secondary | ICD-10-CM | POA: Diagnosis not present

## 2023-05-19 DIAGNOSIS — R1310 Dysphagia, unspecified: Secondary | ICD-10-CM | POA: Diagnosis present

## 2023-05-19 DIAGNOSIS — Z79890 Hormone replacement therapy (postmenopausal): Secondary | ICD-10-CM

## 2023-05-19 DIAGNOSIS — I6523 Occlusion and stenosis of bilateral carotid arteries: Secondary | ICD-10-CM | POA: Diagnosis not present

## 2023-05-19 DIAGNOSIS — E1165 Type 2 diabetes mellitus with hyperglycemia: Secondary | ICD-10-CM | POA: Diagnosis present

## 2023-05-19 DIAGNOSIS — I5023 Acute on chronic systolic (congestive) heart failure: Secondary | ICD-10-CM | POA: Diagnosis not present

## 2023-05-19 DIAGNOSIS — Z1152 Encounter for screening for COVID-19: Secondary | ICD-10-CM | POA: Diagnosis not present

## 2023-05-19 DIAGNOSIS — I672 Cerebral atherosclerosis: Secondary | ICD-10-CM | POA: Diagnosis not present

## 2023-05-19 DIAGNOSIS — K7689 Other specified diseases of liver: Secondary | ICD-10-CM | POA: Diagnosis not present

## 2023-05-19 DIAGNOSIS — N179 Acute kidney failure, unspecified: Secondary | ICD-10-CM | POA: Diagnosis not present

## 2023-05-19 DIAGNOSIS — H532 Diplopia: Secondary | ICD-10-CM | POA: Diagnosis not present

## 2023-05-19 DIAGNOSIS — J811 Chronic pulmonary edema: Secondary | ICD-10-CM | POA: Diagnosis not present

## 2023-05-19 DIAGNOSIS — I4510 Unspecified right bundle-branch block: Secondary | ICD-10-CM | POA: Diagnosis present

## 2023-05-19 DIAGNOSIS — Z5320 Procedure and treatment not carried out because of patient's decision for unspecified reasons: Secondary | ICD-10-CM | POA: Diagnosis not present

## 2023-05-19 DIAGNOSIS — M810 Age-related osteoporosis without current pathological fracture: Secondary | ICD-10-CM | POA: Diagnosis present

## 2023-05-19 DIAGNOSIS — Z8673 Personal history of transient ischemic attack (TIA), and cerebral infarction without residual deficits: Secondary | ICD-10-CM

## 2023-05-19 HISTORY — DX: Chronic diastolic (congestive) heart failure: I50.32

## 2023-05-19 LAB — LACTIC ACID, PLASMA
Lactic Acid, Venous: 6.3 mmol/L (ref 0.5–1.9)
Lactic Acid, Venous: 6.5 mmol/L (ref 0.5–1.9)

## 2023-05-19 LAB — EXPECTORATED SPUTUM ASSESSMENT W GRAM STAIN, RFLX TO RESP C

## 2023-05-19 LAB — COMPREHENSIVE METABOLIC PANEL
ALT: 56 U/L — ABNORMAL HIGH (ref 0–44)
AST: 168 U/L — ABNORMAL HIGH (ref 15–41)
Albumin: 2.6 g/dL — ABNORMAL LOW (ref 3.5–5.0)
Alkaline Phosphatase: 58 U/L (ref 38–126)
Anion gap: 13 (ref 5–15)
BUN: 15 mg/dL (ref 8–23)
CO2: 18 mmol/L — ABNORMAL LOW (ref 22–32)
Calcium: 8.3 mg/dL — ABNORMAL LOW (ref 8.9–10.3)
Chloride: 108 mmol/L (ref 98–111)
Creatinine, Ser: 1.08 mg/dL — ABNORMAL HIGH (ref 0.44–1.00)
GFR, Estimated: 54 mL/min — ABNORMAL LOW (ref >60)
Glucose, Bld: 177 mg/dL — ABNORMAL HIGH (ref 70–99)
Potassium: 3.1 mmol/L — ABNORMAL LOW (ref 3.5–5.1)
Sodium: 139 mmol/L (ref 135–145)
Total Bilirubin: 0.9 mg/dL (ref <1.2)
Total Protein: 5.7 g/dL — ABNORMAL LOW (ref 6.5–8.1)

## 2023-05-19 LAB — APTT: aPTT: 34 s (ref 24–36)

## 2023-05-19 LAB — CBC WITH DIFFERENTIAL/PLATELET
Abs Immature Granulocytes: 0.09 10*3/uL — ABNORMAL HIGH (ref 0.00–0.07)
Basophils Absolute: 0 10*3/uL (ref 0.0–0.1)
Basophils Relative: 0 %
Eosinophils Absolute: 0 10*3/uL (ref 0.0–0.5)
Eosinophils Relative: 0 %
HCT: 33.9 % — ABNORMAL LOW (ref 36.0–46.0)
Hemoglobin: 11.1 g/dL — ABNORMAL LOW (ref 12.0–15.0)
Immature Granulocytes: 1 %
Lymphocytes Relative: 2 %
Lymphs Abs: 0.3 10*3/uL — ABNORMAL LOW (ref 0.7–4.0)
MCH: 30.2 pg (ref 26.0–34.0)
MCHC: 32.7 g/dL (ref 30.0–36.0)
MCV: 92.1 fL (ref 80.0–100.0)
Monocytes Absolute: 0.7 10*3/uL (ref 0.1–1.0)
Monocytes Relative: 6 %
Neutro Abs: 10.8 10*3/uL — ABNORMAL HIGH (ref 1.7–7.7)
Neutrophils Relative %: 91 %
Platelets: 65 10*3/uL — ABNORMAL LOW (ref 150–400)
RBC: 3.68 MIL/uL — ABNORMAL LOW (ref 3.87–5.11)
RDW: 15.8 % — ABNORMAL HIGH (ref 11.5–15.5)
WBC: 11.9 10*3/uL — ABNORMAL HIGH (ref 4.0–10.5)
nRBC: 0 % (ref 0.0–0.2)

## 2023-05-19 LAB — SARS CORONAVIRUS 2 BY RT PCR: SARS Coronavirus 2 by RT PCR: NEGATIVE

## 2023-05-19 LAB — MAGNESIUM: Magnesium: 1.1 mg/dL — ABNORMAL LOW (ref 1.7–2.4)

## 2023-05-19 LAB — GLUCOSE, CAPILLARY: Glucose-Capillary: 163 mg/dL — ABNORMAL HIGH (ref 70–99)

## 2023-05-19 LAB — TROPONIN I (HIGH SENSITIVITY)
Troponin I (High Sensitivity): 55 ng/L — ABNORMAL HIGH (ref <18)
Troponin I (High Sensitivity): 70 ng/L — ABNORMAL HIGH (ref <18)
Troponin I (High Sensitivity): 120 ng/L (ref <18)
Troponin I (High Sensitivity): 126 ng/L (ref <18)

## 2023-05-19 LAB — T4, FREE: Free T4: 0.89 ng/dL (ref 0.61–1.12)

## 2023-05-19 LAB — PROCALCITONIN: Procalcitonin: 31.96 ng/mL

## 2023-05-19 LAB — PROTIME-INR
INR: 1.5 — ABNORMAL HIGH (ref 0.8–1.2)
Prothrombin Time: 17.9 s — ABNORMAL HIGH (ref 11.4–15.2)

## 2023-05-19 LAB — BRAIN NATRIURETIC PEPTIDE: B Natriuretic Peptide: 881.6 pg/mL — ABNORMAL HIGH (ref 0.0–100.0)

## 2023-05-19 LAB — TSH: TSH: 1.563 u[IU]/mL (ref 0.350–4.500)

## 2023-05-19 LAB — PHOSPHORUS: Phosphorus: 1.9 mg/dL — ABNORMAL LOW (ref 2.5–4.6)

## 2023-05-19 MED ORDER — POTASSIUM CHLORIDE 10 MEQ/100ML IV SOLN
10.0000 meq | INTRAVENOUS | Status: AC
  Administered 2023-05-19 – 2023-05-20 (×4): 10 meq via INTRAVENOUS
  Filled 2023-05-19 (×5): qty 100

## 2023-05-19 MED ORDER — SODIUM CHLORIDE 0.9 % IV BOLUS
1000.0000 mL | Freq: Once | INTRAVENOUS | Status: AC
  Administered 2023-05-19: 1000 mL via INTRAVENOUS

## 2023-05-19 MED ORDER — SODIUM CHLORIDE 0.9 % IV SOLN: 250.0000 mL | INTRAVENOUS | Status: AC

## 2023-05-19 MED ORDER — DOCUSATE SODIUM 100 MG PO CAPS: 100.0000 mg | ORAL_CAPSULE | Freq: Two times a day (BID) | ORAL | Status: DC | PRN

## 2023-05-19 MED ORDER — INSULIN ASPART 100 UNIT/ML IJ SOLN
0.0000 [IU] | Freq: Three times a day (TID) | INTRAMUSCULAR | Status: DC
  Administered 2023-05-22 – 2023-05-23 (×3): 1 [IU] via SUBCUTANEOUS
  Administered 2023-05-23: 2 [IU] via SUBCUTANEOUS
  Administered 2023-05-24: 5 [IU] via SUBCUTANEOUS
  Administered 2023-05-24: 2 [IU] via SUBCUTANEOUS
  Administered 2023-05-24: 1 [IU] via SUBCUTANEOUS
  Administered 2023-05-25: 3 [IU] via SUBCUTANEOUS
  Administered 2023-05-25 (×2): 1 [IU] via SUBCUTANEOUS
  Administered 2023-05-26 – 2023-05-27 (×3): 2 [IU] via SUBCUTANEOUS
  Administered 2023-05-27: 5 [IU] via SUBCUTANEOUS
  Filled 2023-05-19 (×13): qty 1

## 2023-05-19 MED ORDER — SODIUM CHLORIDE 0.9 % IV SOLN: Freq: Once | INTRAVENOUS | Status: AC

## 2023-05-19 MED ORDER — NOREPINEPHRINE 4 MG/250ML-% IV SOLN
0.0000 ug/min | INTRAVENOUS | Status: DC
  Administered 2023-05-19: 2 ug/min via INTRAVENOUS
  Filled 2023-05-19: qty 250

## 2023-05-19 MED ORDER — POLYETHYLENE GLYCOL 3350 17 G PO PACK: 17.0000 g | PACK | Freq: Every day | ORAL | Status: DC | PRN

## 2023-05-19 MED ORDER — MAGNESIUM SULFATE 2 GM/50ML IV SOLN
2.0000 g | Freq: Once | INTRAVENOUS | Status: DC
  Filled 2023-05-19: qty 50

## 2023-05-19 MED ORDER — HYDROXYZINE HCL 25 MG PO TABS: 25.0000 mg | ORAL_TABLET | Freq: Three times a day (TID) | ORAL | Status: DC | PRN

## 2023-05-19 MED ORDER — ACETAMINOPHEN 325 MG PO TABS
650.0000 mg | ORAL_TABLET | ORAL | Status: DC | PRN
  Administered 2023-05-22: 650 mg via ORAL
  Filled 2023-05-19: qty 2

## 2023-05-19 MED ORDER — IPRATROPIUM-ALBUTEROL 0.5-2.5 (3) MG/3ML IN SOLN
3.0000 mL | Freq: Four times a day (QID) | RESPIRATORY_TRACT | Status: DC | PRN
  Administered 2023-05-24 – 2023-05-25 (×3): 3 mL via RESPIRATORY_TRACT
  Filled 2023-05-19 (×3): qty 3

## 2023-05-19 MED ORDER — MAGNESIUM SULFATE 50 % IJ SOLN: 6.0000 g | Freq: Once | INTRAVENOUS | Status: DC

## 2023-05-19 MED ORDER — ONDANSETRON HCL 4 MG/2ML IJ SOLN
4.0000 mg | Freq: Four times a day (QID) | INTRAMUSCULAR | Status: DC | PRN
  Administered 2023-05-24 – 2023-05-25 (×2): 4 mg via INTRAVENOUS
  Filled 2023-05-19 (×3): qty 2

## 2023-05-19 MED ORDER — GUAIFENESIN 100 MG/5ML PO LIQD
5.0000 mL | ORAL | Status: DC | PRN
  Administered 2023-05-20 – 2023-05-24 (×2): 5 mL via ORAL
  Filled 2023-05-19 (×3): qty 10

## 2023-05-19 MED ORDER — MAGNESIUM SULFATE 4 GM/100ML IV SOLN
4.0000 g | Freq: Once | INTRAVENOUS | Status: AC
  Administered 2023-05-19: 4 g via INTRAVENOUS
  Filled 2023-05-19: qty 100

## 2023-05-19 MED ORDER — SODIUM BICARBONATE 8.4 % IV SOLN
50.0000 meq | Freq: Once | INTRAVENOUS | Status: AC
  Administered 2023-05-19: 50 meq via INTRAVENOUS
  Filled 2023-05-19: qty 50

## 2023-05-19 MED ORDER — POTASSIUM PHOSPHATES 15 MMOLE/5ML IV SOLN
30.0000 mmol | Freq: Once | INTRAVENOUS | Status: AC
  Administered 2023-05-20: 30 mmol via INTRAVENOUS
  Filled 2023-05-19: qty 10

## 2023-05-19 MED ORDER — INSULIN ASPART 100 UNIT/ML IJ SOLN: 0.0000 [IU] | Freq: Every day | INTRAMUSCULAR | Status: DC

## 2023-05-19 MED ORDER — SODIUM CHLORIDE 0.9 % IV SOLN
2.0000 g | INTRAVENOUS | Status: AC
  Administered 2023-05-19 – 2023-05-23 (×5): 2 g via INTRAVENOUS
  Filled 2023-05-19 (×6): qty 20

## 2023-05-19 MED ORDER — DEXTROSE 5 % IV SOLN
500.0000 mg | INTRAVENOUS | Status: DC
  Administered 2023-05-19 – 2023-05-20 (×2): 500 mg via INTRAVENOUS
  Filled 2023-05-19 (×2): qty 5

## 2023-05-19 MED ORDER — DONEPEZIL HCL 10 MG PO TABS: 10.0000 mg | ORAL_TABLET | Freq: Every day | ORAL | 0 refills | Status: DC

## 2023-05-19 MED ORDER — NOREPINEPHRINE 4 MG/250ML-% IV SOLN
2.0000 ug/min | INTRAVENOUS | Status: DC
  Administered 2023-05-19: 8 ug/min via INTRAVENOUS
  Administered 2023-05-20: 3 ug/min via INTRAVENOUS
  Administered 2023-05-20: 4 ug/min via INTRAVENOUS
  Filled 2023-05-19 (×2): qty 250

## 2023-05-19 NOTE — Sepsis Progress Note (Signed)
Sepsis protocol monitored by e[facility] ?

## 2023-05-19 NOTE — ED Triage Notes (Addendum)
BIB [facility] from home for "not feeling well", and NV, sx onset this am. Endorses general chronic pain. Denies sob, fever, urinary sx, or diarrhea. Denies new pain. EMS reports pt was lethargic, diaphoretic, with general weakness. Improved enroute with IVF. 20g L FA NSL. Pupils PERRL, pinpoint, takes oxycodone. Denies pain patches. Poor historian. ST 130.CBG 167. Arrives awake, interactive, follows commands, NAD, calm.

## 2023-05-19 NOTE — ED Provider Notes (Addendum)
[facility] Provider Note    Event Date/Time   First MD Initiated Contact with Patient 05/19/23 1520     (approximate)   History   Emesis   HPI  Jenny [name] is a 77 y.o. female HTZ past medical history chronic kidney pain and neuropsychiatric disorder presents to the ER for "not feeling well nausea vomiting starting this morning.  Reportedly was lethargic diaphoretic with significant tachycardia improved with IV fluids.  Does take narcotic medications for chronic pain.  Patient somewhat of a poor historian and unable to provide any additional history.     Physical Exam   Triage Vital Signs: ED Triage Vitals  Encounter Vitals Group     BP 05/19/23 1510 (!) 120/55     Systolic BP Percentile --      Diastolic BP Percentile --      Pulse Rate 05/19/23 1510 (!) 121     Resp 05/19/23 1510 14     Temp 05/19/23 1510 98.7 F (37.1 C)     Temp Source 05/19/23 1510 Oral     SpO2 05/19/23 1508 96 %     Weight 05/19/23 1512 96 lb (43.5 kg)     Height --      Head Circumference --      Peak Flow --      Pain Score 05/19/23 1511 3     Pain Loc --      Pain Education --      Exclude from Growth Chart --     Most recent vital signs: Vitals:   05/19/23 1715 05/19/23 1730  BP:  (!) 81/45  Pulse: (!) 105 (!) 108  Resp: (!) 22 19  Temp:    SpO2: 93% 92%     Constitutional: Alert,  frail  and cachectic appearing Eyes: Conjunctivae are normal.  Head: Atraumatic. Nose: No congestion/rhinnorhea. Mouth/Throat: Mucous membranes are moist.   Neck: Painless ROM.  Cardiovascular:   Good peripheral circulation. Respiratory: Normal respiratory effort.  No retractions.  Gastrointestinal: Soft and nontender.  Musculoskeletal:  no deformity Neurologic:  MAE spontaneously. No gross focal neurologic deficits are appreciated.  Skin:  Skin is warm, dry and intact. No rash noted. Psychiatric: Mood and affect are normal. Speech and behavior are  normal.    ED Results / Procedures / Treatments   Labs (all labs ordered are listed, but only abnormal results are displayed) Labs Reviewed  LACTIC ACID, PLASMA - Abnormal; Notable for the following components:      Result Value   Lactic Acid, Venous 6.3 (*)    All other components within normal limits  COMPREHENSIVE METABOLIC PANEL - Abnormal; Notable for the following components:   Potassium 3.1 (*)    CO2 18 (*)    Glucose, Bld 177 (*)    Creatinine, Ser 1.08 (*)    Calcium 8.3 (*)    Total Protein 5.7 (*)    Albumin 2.6 (*)    AST 168 (*)    ALT 56 (*)    GFR, Estimated 54 (*)    All other components within normal limits  CBC WITH DIFFERENTIAL/PLATELET - Abnormal; Notable for the following components:   WBC 11.9 (*)    RBC 3.68 (*)    Hemoglobin 11.1 (*)    HCT 33.9 (*)    RDW 15.8 (*)    Platelets 65 (*)    Neutro Abs 10.8 (*)    Lymphs Abs 0.3 (*)    Abs Immature Granulocytes  0.09 (*)    All other components within normal limits  PROTIME-INR - Abnormal; Notable for the following components:   Prothrombin Time 17.9 (*)    INR 1.5 (*)    All other components within normal limits  TROPONIN I (HIGH SENSITIVITY) - Abnormal; Notable for the following components:   Troponin I (High Sensitivity) 55 (*)    All other components within normal limits  SARS CORONAVIRUS 2 BY RT PCR  CULTURE, BLOOD (ROUTINE X 2)  CULTURE, BLOOD (ROUTINE X 2)  APTT  LACTIC ACID, PLASMA  URINALYSIS, W/ REFLEX TO CULTURE (INFECTION SUSPECTED)  TROPONIN I (HIGH SENSITIVITY)     EKG  ED ECG REPORT I, Willy Eddy, the attending physician, personally viewed and interpreted this ECG.   Date: 05/19/2023  EKG Time: 15:11  Rate: 120  Rhythm: sinus  Axis: left  Intervals:normal  ST&T Change: nonspecific st abn     RADIOLOGY Please see ED Course for my review and interpretation.  I personally reviewed all radiographic images ordered to evaluate for the above acute complaints and  reviewed radiology reports and findings.  These findings were personally discussed with the patient.  Please see medical record for radiology report.    PROCEDURES:  Critical Care performed: yes  .Critical Care  Performed by: Willy Eddy, MD Authorized by: Willy Eddy, MD   Critical care provider statement:    Critical care time (minutes):  35   Critical care was necessary to treat or prevent imminent or life-threatening deterioration of the following conditions:  Sepsis   Critical care was time spent personally by me on the following activities:  Ordering and performing treatments and interventions, ordering and review of laboratory studies, ordering and review of radiographic studies, pulse oximetry, re-evaluation of patient's condition, review of old charts, obtaining history from patient or surrogate, examination of patient, evaluation of patient's response to treatment, discussions with primary provider, discussions with consultants and development of treatment plan with patient or surrogate    MEDICATIONS ORDERED IN ED: Medications  cefTRIAXone (ROCEPHIN) 2 g in sodium chloride 0.9 % 100 mL IVPB (0 g Intravenous Stopped 05/19/23 1741)  azithromycin (ZITHROMAX) 500 mg in dextrose 5 % 250 mL IVPB (500 mg Intravenous New Bag/Given 05/19/23 1730)  norepinephrine (LEVOPHED) 4mg  in (0.016 mg/mL) premix infusion (4 mcg/min Intravenous Rate/Dose Change 05/19/23 1803)  0.9 %  sodium chloride infusion (has no administration in time range)  sodium chloride 0.9 % bolus 1,000 mL (0 mLs Intravenous Stopped 05/19/23 1629)  sodium chloride 0.9 % bolus 1,000 mL (0 mLs Intravenous Stopped 05/19/23 1725)     IMPRESSION / MDM / ASSESSMENT AND PLAN / ED COURSE  I reviewed the triage vital signs and the nursing notes.                              Differential diagnosis includes, but is not limited to, Dehydration, sepsis, pna, uti, hypoglycemia, cva, drug effect, withdrawal,  encephalitis  Patient presenting to the ER for evaluation of symptoms as described above.  Based on symptoms, risk factors and considered above differential, this presenting complaint could reflect a potentially life-threatening illness therefore the patient will be placed on continuous pulse oximetry and telemetry for monitoring.  Laboratory evaluation will be sent to evaluate for the above complaints.      Clinical Course as of 05/19/23 1804  Mon May 19, 2023  1544 Chest x-ray on my review and interpretation without  evidence of pneumothorax.  Suspicious for patchy opacities.  Will await formal radiology report. [PR]  1703 Patient with significantly elevated lactate persistent hypotension.  Patient still receiving her 30 cc/kg IV fluid resuscitation.  Once she has received this if still hypotensive will initiate vasopressors.  Have ordered broad-spectrum antibiotics. [PR]  1753 Patient despite IV fluid resuscitation is still with soft blood pressures.  Will start nor epi.  Will consult critical care. [PR]    Clinical Course User Index [PR] Willy Eddy, MD     FINAL CLINICAL IMPRESSION(S) / ED DIAGNOSES   Final diagnoses:  Sepsis with acute organ dysfunction and septic shock, due to unspecified organism, unspecified organ dysfunction type [facility])     Rx / DC Orders   ED Discharge Orders     None        Note:  This document was prepared using Dragon voice recognition software and may include unintentional dictation errors.    Willy Eddy, MD 05/19/23 Julio Sicks    Willy Eddy, MD 05/19/23 [phone]

## 2023-05-19 NOTE — Progress Notes (Signed)
PHARMACY CONSULT NOTE - FOLLOW UP  Pharmacy Consult for Electrolyte Monitoring and Replacement   Recent Labs: Potassium (mmol/L)  Date Value  05/19/2023 3.1 (L)  10/24/2014 4.4   Magnesium (mg/dL)  Date Value  16/04/9603 2.1   Calcium (mg/dL)  Date Value  54/03/8118 8.3 (L)   Calcium, Total (mg/dL)  Date Value  14/78/2956 8.6 (L)   Albumin (g/dL)  Date Value  21/30/8657 2.6 (L)  05/23/2020 3.9  03/02/2012 3.2 (L)   Sodium (mmol/L)  Date Value  05/19/2023 139  05/23/2020 138  10/24/2014 134 (L)     Assessment: 77 y.o. female HTZ past medical history chronic kidney pain and neuropsychiatric disorder presents to the ER for "not feeling well nausea vomiting starting this morning. Pharmacy is asked to follow and replace electrolytes while in CCU  Goal of Therapy:  Electrolytes WNL  Plan:  ---10 mEq IV KCl x 4 ---recheck electrolytes in am  Lowella Bandy ,PharmD Clinical Pharmacist 05/19/2023 6:24 PM

## 2023-05-19 NOTE — ED Notes (Signed)
PCCM NP at [facility].

## 2023-05-19 NOTE — Consult Note (Signed)
CODE SEPSIS - PHARMACY COMMUNICATION  **Broad Spectrum Antibiotics should be administered within 1 hour of Sepsis diagnosis**  Time Code Sepsis Called/Page Received: 1703   Antibiotics Ordered: Ceftriaxone & Azithromycin   Time of 1st antibiotic administration: 1729   Additional action taken by pharmacy: N/A  If necessary, Name of Provider/Nurse Contacted: N/A  Littie Deeds, PharmD Pharmacy Resident  05/19/2023 5:04 PM

## 2023-05-19 NOTE — H&P (Signed)
NAME:  Jenny [name], MRN:  [id number], DOB:  1945-11-24, LOS: 0 ADMISSION DATE:  05/19/2023, CONSULTATION DATE: 05/19/2023 REFERRING MD: Dr. Roxan Hockey, CHIEF COMPLAINT: Emesis   History of Present Illness:  This is a 77 yo female who presented to [facility] ER on 11/18 via EMS with c/o "not feeling well", nausea, and vomiting.  EMS reported when they arrived at pts home she was lethargic and diaphoretic with generalized weakness.  She received iv fluids en route to the ER.  Pts husband reports the pt has had a cough over the past 2 weeks, however her cough worsened today and she vomited bile prompting EMS notification.  ED Course  Upon arrival to the ER pt hypotensive and tachycardic meeting sepsis criteria.  She received 2L NS bolus/azithromycin/ceftriaxone, however she remained hypotensive requiring levophed gtt.  CXR concerning for pneumonia.  Significant lab results were: K+ 3.1/CO2 18/glucose 177/creatinine 1.08/calcium 8.3/albumin 2.6/AST 168/ALT 56/ troponin 55/lactic acid 6.3/wbc 11.9/hgb 11.1/platelets 65.  PCCM team contacted for ICU admission.     Pertinent  Medical History  Anxiety  Arthritis  CAD  Chronic Systolic CHF Depression  Type II Diabetes Mellitus  HLD Hypothyroidism  IBS Iron Deficiency Anemia  Osteoporosis  Panic Attacks  Severe Aortic Stenosis  Stroke  TIA   Significant Hospital Events: Including procedures, antibiotic start and stop dates in addition to other pertinent events   11/18: Pt admitted with septic shock secondary to pneumonia requiring levophed gtt   Interim History / Subjective:  Pt with frequent productive cough and complains of worsening chest pain with coughing consistent with pleuritic chest pain.  Pt currently requiring levophed gtt @4mcg /min to maintain map 65 or higher  Objective   Blood pressure (!) 85/48, pulse (!) 106, temperature 98.7 F (37.1 C), temperature source Oral, resp. rate 14, weight 43.5 kg, SpO2 96%.    FiO2 (%):  [100  %] 100 %   Intake/Output Summary (Last 24 hours) at 05/19/2023 1823 Last data filed at 05/19/2023 1741 Gross per 24 hour  Intake 2600 ml  Output --  Net 2600 ml   Filed Weights   05/19/23 1512  Weight: 43.5 kg    Examination: General: Acute on chronically-ill appearing cachetic female, NAD on 2L O2 HENT: Supple, no JVD  Lungs: Diffuse rhonchi throughout, even, non labored  Cardiovascular: Sinus tachycardia, s1s2, no m/r/g, 2+ radial/1+ distal pulses, no edema  Abdomen: +BS x4, non tender, non distended  Extremities: Moves all extremities  Skin: Generalized abrasions present on admission Neuro: Alert, confused to place, following commands, PERRLA  GU: Deferred  Resolved Hospital Problem list     Assessment & Plan:   #Acute respiratory failure secondary to pneumonia  - Supplemental O2 for dyspnea and/or hypoxia  - Maintain O2 sats 92% or above - Chest PT - Prn bronchodilator therapy  #Septic shock #Elevated troponin suspect secondary to demand ischemia  #Chronic systolic CHF  Hx: CAD, HLD, Stroke, Severe Aortic Stenosis, and TIA  - Continuous telemetry monitoring  - Trend troponin's until peaked  - Prn levophed gtt to maintain map 65 or higher  - BNP pending - Echo pending  - TSH and free T4 pending   #Mild acute kidney injury secondary ATN  #Metabolic acidosis  #Lactic acidosis  #Hypokalemia  - Trend BMP and lactic acid  - Stat VBG, magnesium, and phosphorous pending  - Replace electrolytes as indicated  - Strict intake and output  - Avoid hepatotoxic medications as able   #Transaminitis - Trend hepatic  function panel  - Avoid hepatotoxic medications as able   #Sepsis  #Pneumonia  - Trend WBC and monitor fever curve  - Trend PCT  - Follow cultures  - Continue azithromycin and rocephin   #Iron deficiency anemia  #Acute on chronic thrombocytopenia  - Trend CBC  - Monitor for s/sx of bleeding  - Transfuse for hgb <7 - Avoid chemical VTE px    #Nausea/vomiting  - Prn zofran for nausea/vomiting  - Clear liquid diet  - KUB pending   #Type II diabetes mellitus  - Hemoglobin A1c pending  - CBG's ac/hs  - SSI   Best Practice (right click and "Reselect all SmartList Selections" daily)   Diet/type: clear liquids DVT prophylaxis: SCD GI prophylaxis: N/A Lines: N/A Foley:  N/A Code Status:  full code Last date of multidisciplinary goals of care discussion [N/A]  11/18: Pt and pts husband updated regarding pts condition and current plan of care  Labs   CBC: Recent Labs  Lab 05/19/23 1559  WBC 11.9*  NEUTROABS 10.8*  HGB 11.1*  HCT 33.9*  MCV 92.1  PLT 65*    Basic Metabolic Panel: Recent Labs  Lab 05/19/23 1559  NA 139  K 3.1*  CL 108  CO2 18*  GLUCOSE 177*  BUN 15  CREATININE 1.08*  CALCIUM 8.3*   GFR: Estimated Creatinine Clearance: 26.2 mL/min (A) (by C-G formula based on SCr of 1.08 mg/dL (H)). Recent Labs  Lab 05/19/23 1559 05/19/23 1727  WBC 11.9*  --   LATICACIDVEN 6.3* 6.5*    Liver Function Tests: Recent Labs  Lab 05/19/23 1559  AST 168*  ALT 56*  ALKPHOS 58  BILITOT 0.9  PROT 5.7*  ALBUMIN 2.6*   No results for input(s): "LIPASE", "AMYLASE" in the last 168 hours. No results for input(s): "AMMONIA" in the last 168 hours.  ABG    Component Value Date/Time   PHART 7.330 (L) 02/18/2017 1630   PCO2ART 38.5 02/18/2017 1630   PO2ART 72.0 (L) 02/18/2017 1630   HCO3 20.3 02/18/2017 1630   TCO2 25 02/19/2017 1657   ACIDBASEDEF 5.0 (H) 02/18/2017 1630   O2SAT 72.9 02/20/2017 0455     Coagulation Profile: Recent Labs  Lab 05/19/23 1559  INR 1.5*    Cardiac Enzymes: No results for input(s): "CKTOTAL", "CKMB", "CKMBINDEX", "TROPONINI" in the last 168 hours.  HbA1C: Hemoglobin A1C  Date/Time Value Ref Range Status  02/19/2021 03:03 PM 5.6 4.0 - 5.6 % Final  10/19/2020 03:49 PM 6.2 (A) 4.0 - 5.6 % Final  10/23/2014 05:40 AM 8.6 (H) % Final    Comment:    4.0-6.0 NOTE:  New Reference Range  09/06/14    HbA1c POC (<> result, manual entry)  Date/Time Value Ref Range Status  03/19/2022 02:57 PM 6.0 4.0 - 5.6 % Final   Hgb A1c MFr Bld  Date/Time Value Ref Range Status  05/23/2020 03:14 PM 5.6 4.8 - 5.6 % Final    Comment:             Prediabetes: 5.7 - 6.4          Diabetes: >6.4          Glycemic control for adults with diabetes: <7.0   02/13/2017 02:01 PM 7.7 (H) 4.8 - 5.6 % Final    Comment:    (NOTE) Pre diabetes:          5.7%-6.4% Diabetes:              >6.4% Glycemic control  for   <7.0% adults with diabetes     CBG: No results for input(s): "GLUCAP" in the last 168 hours.  Review of Systems: Positives in BOLD   Gen: poor po intake, fever, chills, weight change, fatigue, night sweats HEENT: Denies blurred vision, double vision, hearing loss, tinnitus, sinus congestion, rhinorrhea, sore throat, neck stiffness, dysphagia PULM: shortness of breath, cough, sputum production, hemoptysis, wheezing CV: Denies chest pain, edema, orthopnea, paroxysmal nocturnal dyspnea, palpitations GI: abdominal pain, nausea, vomiting, diarrhea, hematochezia, melena, constipation, change in bowel habits GU: Denies dysuria, hematuria, polyuria, oliguria, urethral discharge Endocrine: Denies hot or cold intolerance, polyuria, polyphagia or appetite change Derm: Denies rash, dry skin, scaling or peeling skin change Heme: Denies easy bruising, bleeding, bleeding gums Neuro: Denies headache, numbness, weakness, slurred speech, loss of memory or consciousness  Past Medical History:  She,  has a past medical history of Anxiety, Arthritis, CAD (coronary artery disease), Chronic systolic CHF (congestive heart failure) (HCC), Depression, Diabetes mellitus with complication (HCC), Hyperlipidemia, Hypothyroidism, IBS (irritable bowel syndrome), Iron deficiency anemia, Microcytic anemia, Osteoporosis, Panic attacks, Severe aortic stenosis, Stroke (HCC) (2001), and TIA  (transient ischemic attack) (2006).   Surgical History:   Past Surgical History:  Procedure Laterality Date   ABDOMINAL HYSTERECTOMY     AORTIC VALVE REPLACEMENT N/A 02/17/2017   Procedure: AORTIC VALVE REPLACEMENT (AVR);  Surgeon: Donata Clay, Theron Arista, MD;  Location: [facility] OR;  Service: Open Heart Surgery;  Laterality: N/A;   CATARACT EXTRACTION W/PHACO Right 04/09/2016   Procedure: CATARACT EXTRACTION PHACO AND INTRAOCULAR LENS PLACEMENT (IOC);  Surgeon: Galen Manila, MD;  Location: [facility] ORS;  Service: Ophthalmology;  Laterality: Right;  [place] 57.2 AP% 18.6 CDE 10.64 Fluid Pack lot # [id number]   CATARACT EXTRACTION W/PHACO Left 12/29/2018   Procedure: CATARACT EXTRACTION PHACO AND INTRAOCULAR LENS PLACEMENT (IOC)  LEFT DIABETIC;  Surgeon: Galen Manila, MD;  Location: [facility];  Service: Ophthalmology;  Laterality: Left;  Diabetic - insuli and oral meds   CHOLECYSTECTOMY     COLONOSCOPY     CORONARY ANGIOGRAPHY N/A 02/03/2017   Procedure: CORONARY ANGIOGRAPHY;  Surgeon: Iran Ouch, MD;  Location: [facility] INVASIVE CV LAB;  Service: Cardiovascular;  Laterality: N/A;   FRACTURE SURGERY     LEFT ANKLE   RIGHT AND LEFT HEART CATH Bilateral 02/03/2017   Procedure: Right and Left Heart Cath;  Surgeon: Iran Ouch, MD;  Location: [facility] INVASIVE CV LAB;  Service: Cardiovascular;  Laterality: Bilateral;   TEE WITHOUT CARDIOVERSION N/A 02/17/2017   Procedure: TRANSESOPHAGEAL ECHOCARDIOGRAM (TEE);  Surgeon: Donata Clay, Theron Arista, MD;  Location: [facility] OR;  Service: Open Heart Surgery;  Laterality: N/A;   TIBIA IM NAIL INSERTION Left 05/19/2018   Procedure: INTRAMEDULLARY (IM) NAIL TIBIAL;  Surgeon: Juanell Fairly, MD;  Location: [facility] ORS;  Service: Orthopedics;  Laterality: Left;     Social History:   reports that she quit smoking about 23 years ago. Her smoking use included cigarettes. She started smoking about 53 years ago. She has never used smokeless tobacco. She reports that she does not  drink alcohol and does not use drugs.   Family History:  Her family history includes Alzheimer's disease in her mother; Cancer in her maternal grandfather and maternal grandmother; Diabetes in her brother and father; Hypertension in her mother; Parkinson's disease in her mother. There is no history of Neuropathy.   Allergies Allergies  Allergen Reactions   Iodine Swelling    ANGIOEDEMA FACIAL SWELLING "BETADINE OKAY"   Shellfish Allergy Anaphylaxis  Home Medications  Prior to Admission medications   Medication Sig Start Date End Date Taking? Authorizing Provider  alendronate (FOSAMAX) 70 MG tablet Take 1 tablet (70 mg total) by mouth once a week. Take with a full glass of water on an empty stomach. 05/27/22   Corky Downs, MD  ALPRAZolam Prudy Feeler) 0.5 MG tablet Take 1 tablet (0.5 mg total) by mouth 2 (two) times daily. 07/03/22   Corky Downs, MD  Cholecalciferol (VITAMIN D3 PO) Take by mouth daily.    [provider]  Cranberry-Vitamin C (AZO CRANBERRY URINARY TRACT) 250-60 MG CAPS Take 1 each by mouth 2 (two) times daily. 04/30/22   Corky Downs, MD  donepezil (ARICEPT) 10 MG tablet Take 1 tablet (10 mg total) by mouth at bedtime. 05/19/23   Lomax, Amy, NP  glucose blood (ONETOUCH ULTRA) test strip TEST BLOOD SUGAR THREE TIMES DAILY 11/02/21   Corky Downs, MD  hydrOXYzine (ATARAX/VISTARIL) 25 MG tablet Take 25 mg by mouth 2 (two) times daily.     [provider]  insulin glargine (LANTUS) 100 UNIT/ML injection Inject 0.25-0.35 mLs (25-35 Units total) into the skin See admin instructions. 35 units every morning and 25 units at bedtime 07/03/22   Corky Downs, MD  Insulin Syringe-Needle U-100 (INSULIN SYRINGE .5CC/31GX5/16") 31G X 5/16" 0.5 ML MISC USE 1 NEEDLE TWICE DAILY 02/18/22   Corky Downs, MD  Lancets (ONETOUCH DELICA PLUS LANCET30G) MISC USE TO TEST BLOOD SUGAR TWICE DAILY 06/27/22   Corky Downs, MD  levocetirizine (XYZAL) 5 MG tablet TAKE 1 TABLET(5 MG) BY  MOUTH EVERY EVENING 08/14/21   Corky Downs, MD  levothyroxine (SYNTHROID) 88 MCG tablet TAKE 1 TABLET BY MOUTH DAILY 08/28/21   Corky Downs, MD  Loperamide HCl (IMODIUM A-D PO) Take by mouth as needed.    [provider]  Melatonin 10 MG TABS Take by mouth at bedtime.    [provider]  metFORMIN (GLUCOPHAGE) 500 MG tablet Take 1 tablet (500 mg total) by mouth 2 (two) times daily. 10/01/21   Corky Downs, MD  rosuvastatin (CRESTOR) 10 MG tablet TAKE 1 TABLET BY MOUTH DAILY 03/28/22   Corky Downs, MD  sertraline (ZOLOFT) 100 MG tablet TAKE 1 TABLET(100 MG) BY MOUTH TWICE DAILY 05/13/22   Kara Dies, NP  sitaGLIPtin (JANUVIA) 100 MG tablet TAKE 1 TABLET BY MOUTH EVERY MORNING 07/02/22   Corky Downs, MD     Critical care time: 60 minutes       Zada Girt, AGNP  Pulmonary/Critical Care Pager [phone] (please enter 7 digits) PCCM Consult Pager [phone] (please enter 7 digits)

## 2023-05-19 NOTE — ED Notes (Signed)
Xray at [facility] 

## 2023-05-19 NOTE — ED Notes (Signed)
ED TO INPATIENT HANDOFF REPORT  ED Nurse Name and Phone #: [phone]  S Name/Age/Gender [name] 77 y.o. female Room/Bed: ED08A/ED08A  Code Status   Code Status: Full Code  Home/SNF/Other Home Patient oriented to: self, time, and situation Is this baseline? No   Triage Complete: Triage complete  Chief Complaint Septic shock (HCC) [A41.9, R65.21]  Triage Note BIB [facility] from home for "not feeling well", and NV, sx onset this am. Endorses general chronic pain. Denies sob, fever, urinary sx, or diarrhea. Denies new pain. EMS reports pt was lethargic, diaphoretic, with general weakness. Improved enroute with IVF. 20g L FA NSL. Pupils PERRL, pinpoint, takes oxycodone. Denies pain patches. Poor historian. ST 130.CBG 167. Arrives awake, interactive, follows commands, NAD, calm.    Allergies Allergies  Allergen Reactions   Iodine Swelling    ANGIOEDEMA FACIAL SWELLING "BETADINE OKAY"   Shellfish Allergy Anaphylaxis    Level of Care/Admitting Diagnosis ED Disposition     ED Disposition  Admit   Condition  --   Comment  Hospital Area: [facility] [[id number]]  Level of Care: ICU [6]  Covid Evaluation: Symptomatic Person Under Investigation (PUI) or recent exposure (last 10 days) *Testing Required*  Diagnosis: Septic shock [facility]) [[id number]]  Admitting Physician: Vida Rigger [[id number]]  Attending Physician: Tawni Carnes          B Medical/Surgery History Past Medical History:  Diagnosis Date   Anxiety    Arthritis    CAD (coronary artery disease)    a. 01/2017 Cath: nonobs dzs.   Chronic systolic CHF (congestive heart failure) (HCC)    a. TTE 12/2016, EF 45-50%, probable hypokinesis of the mid apical anteroseptal, anterior, & apical myocardium, GR2DD, possibly bicuspid aortic valve that was moderately thickened w/ severely calcified leaflets, severe aortic stenosis w/ mean gradient 47 mmHg, valve area 0.52 cm, mild MR, mildly  dilated LA   Depression    Diabetes mellitus with complication (HCC)    Tylenol   Hyperlipidemia    Hypothyroidism    IBS (irritable bowel syndrome)    Iron deficiency anemia    a. s/p pRBC x1 in 12/2016   Microcytic anemia    Osteoporosis    Panic attacks    Severe aortic stenosis    a. TTE 12/2016: possibly bicuspid aortic valve that was moderately thickened with severely calcified leaflets. There was severe aortic stenosis with a mean gradient of 47 mmHg and valve area of 0.52 cm; b. 01/2017 s/p AVR w/ 19 mm bioprosthetic Magna Ease pericardial tissue valve, ser# [id number].   Stroke [facility]) 2001   TIA (transient ischemic attack) 2006   Past Surgical History:  Procedure Laterality Date   ABDOMINAL HYSTERECTOMY     AORTIC VALVE REPLACEMENT N/A 02/17/2017   Procedure: AORTIC VALVE REPLACEMENT (AVR);  Surgeon: Donata Clay, Theron Arista, MD;  Location: [facility] OR;  Service: Open Heart Surgery;  Laterality: N/A;   CATARACT EXTRACTION W/PHACO Right 04/09/2016   Procedure: CATARACT EXTRACTION PHACO AND INTRAOCULAR LENS PLACEMENT (IOC);  Surgeon: Galen Manila, MD;  Location: [facility] ORS;  Service: Ophthalmology;  Laterality: Right;  [place] 57.2 AP% 18.6 CDE 10.64 Fluid Pack lot # [id number]   CATARACT EXTRACTION W/PHACO Left 12/29/2018   Procedure: CATARACT EXTRACTION PHACO AND INTRAOCULAR LENS PLACEMENT (IOC)  LEFT DIABETIC;  Surgeon: Galen Manila, MD;  Location: [facility];  Service: Ophthalmology;  Laterality: Left;  Diabetic - insuli and oral meds   CHOLECYSTECTOMY     COLONOSCOPY  CORONARY ANGIOGRAPHY N/A 02/03/2017   Procedure: CORONARY ANGIOGRAPHY;  Surgeon: Iran Ouch, MD;  Location: [facility] INVASIVE CV LAB;  Service: Cardiovascular;  Laterality: N/A;   FRACTURE SURGERY     LEFT ANKLE   RIGHT AND LEFT HEART CATH Bilateral 02/03/2017   Procedure: Right and Left Heart Cath;  Surgeon: Iran Ouch, MD;  Location: [facility] INVASIVE CV LAB;  Service: Cardiovascular;  Laterality: Bilateral;    TEE WITHOUT CARDIOVERSION N/A 02/17/2017   Procedure: TRANSESOPHAGEAL ECHOCARDIOGRAM (TEE);  Surgeon: Donata Clay, Theron Arista, MD;  Location: [facility] OR;  Service: Open Heart Surgery;  Laterality: N/A;   TIBIA IM NAIL INSERTION Left 05/19/2018   Procedure: INTRAMEDULLARY (IM) NAIL TIBIAL;  Surgeon: Juanell Fairly, MD;  Location: [facility] ORS;  Service: Orthopedics;  Laterality: Left;     A IV Location/Drains/Wounds Patient Lines/Drains/Airways Status     Active Line/Drains/Airways     Name Placement date Placement time Site Days   Peripheral IV 05/19/23 20 G Left;Lateral Forearm 05/19/23  1508  Forearm  less than 1   Peripheral IV 05/19/23 18 G 1" Anterior;Proximal;Right Forearm 05/19/23  1724  Forearm  less than 1   Incision (Closed) 02/17/17 Chest Other (Comment) 02/17/17  1035  -- 2282   Incision (Closed) 05/19/18 Ankle Left 05/19/18  1255  -- 1826   Incision (Closed) 05/19/18 Knee Left 05/19/18  1303  -- 1826   Incision (Closed) 12/29/18 Eye Left 12/29/18  0952  -- 1602            Intake/Output Last 24 hours  Intake/Output Summary (Last 24 hours) at 05/19/2023 1904 Last data filed at 05/19/2023 1824 Gross per 24 hour  Intake 2850 ml  Output --  Net 2850 ml    Labs/Imaging Results for orders placed or performed during the hospital encounter of 05/19/23 (from the past 48 hour(s))  Lactic acid, plasma     Status: Abnormal   Collection Time: 05/19/23  3:59 PM  Result Value Ref Range   Lactic Acid, Venous 6.3 (HH) 0.5 - 1.9 mmol/L    Comment: CRITICAL RESULT CALLED TO, READ BACK BY AND VERIFIED WITH [place] AT 1642 ON 05/19/23 BY SS Performed at [facility], [address] [place], [place] [address]   Comprehensive metabolic panel     Status: Abnormal   Collection Time: 05/19/23  3:59 PM  Result Value Ref Range   Sodium 139 135 - 145 mmol/L   Potassium 3.1 (L) 3.5 - 5.1 mmol/L   Chloride 108 98 - 111 mmol/L   CO2 18 (L) 22 - 32 mmol/L   Glucose, Bld 177 (H) 70 - 99  mg/dL    Comment: Glucose reference range applies only to samples taken after fasting for at least 8 hours.   BUN 15 8 - 23 mg/dL   Creatinine, Ser 8.41 (H) 0.44 - 1.00 mg/dL   Calcium 8.3 (L) 8.9 - 10.3 mg/dL   Total Protein 5.7 (L) 6.5 - 8.1 g/dL   Albumin 2.6 (L) 3.5 - 5.0 g/dL   AST 324 (H) 15 - 41 U/L   ALT 56 (H) 0 - 44 U/L   Alkaline Phosphatase 58 38 - 126 U/L   Total Bilirubin 0.9 <1.2 mg/dL   GFR, Estimated 54 (L) >60 mL/min    Comment: (NOTE) Calculated using the CKD-EPI Creatinine Equation (2021)    Anion gap 13 5 - 15    Comment: Performed at [facility], [address], [place], [place] [address]  CBC  with Differential     Status: Abnormal   Collection Time: 05/19/23  3:59 PM  Result Value Ref Range   WBC 11.9 (H) 4.0 - 10.5 K/uL   RBC 3.68 (L) 3.87 - 5.11 MIL/uL   Hemoglobin 11.1 (L) 12.0 - 15.0 g/dL   HCT 14.7 (L) 82.9 - 56.2 %   MCV 92.1 80.0 - 100.0 fL   MCH 30.2 26.0 - 34.0 pg   MCHC 32.7 30.0 - 36.0 g/dL   RDW 13.0 (H) 86.5 - 78.4 %   Platelets 65 (L) 150 - 400 K/uL    Comment: Immature Platelet Fraction may be clinically indicated, consider ordering this additional test [id number]    nRBC 0.0 0.0 - 0.2 %   Neutrophils Relative % 91 %   Neutro Abs 10.8 (H) 1.7 - 7.7 K/uL   Lymphocytes Relative 2 %   Lymphs Abs 0.3 (L) 0.7 - 4.0 K/uL   Monocytes Relative 6 %   Monocytes Absolute 0.7 0.1 - 1.0 K/uL   Eosinophils Relative 0 %   Eosinophils Absolute 0.0 0.0 - 0.5 K/uL   Basophils Relative 0 %   Basophils Absolute 0.0 0.0 - 0.1 K/uL   WBC Morphology MORPHOLOGY UNREMARKABLE    Smear Review MORPHOLOGY UNREMARKABLE    Immature Granulocytes 1 %   Abs Immature Granulocytes 0.09 (H) 0.00 - 0.07 K/uL   Ovalocytes PRESENT     Comment: Performed at [facility], [address] [place], [place] [address]  Protime-INR     Status: Abnormal   Collection Time: 05/19/23  3:59 PM  Result Value Ref Range   Prothrombin Time 17.9 (H) 11.4 - 15.2  seconds   INR 1.5 (H) 0.8 - 1.2    Comment: (NOTE) INR goal varies based on device and disease states. Performed at [facility], [address] [place], [place] [address]   APTT     Status: None   Collection Time: 05/19/23  3:59 PM  Result Value Ref Range   aPTT 34 24 - 36 seconds    Comment: Performed at [facility], [address] [place], [place] [address]  Troponin I (High Sensitivity)     Status: Abnormal   Collection Time: 05/19/23  3:59 PM  Result Value Ref Range   Troponin I (High Sensitivity) 55 (H) <18 ng/L    Comment: (NOTE) Elevated high sensitivity troponin I (hsTnI) values and significant  changes across serial measurements may suggest ACS but many other  chronic and acute conditions are known to elevate hsTnI results.  Refer to the "Links" section for chest pain algorithms and additional  guidance. Performed at [facility], [address] [place], [place] [address]   SARS Coronavirus 2 by RT PCR (hospital order, performed in [facility] hospital lab) *cepheid single result test* Anterior Nasal Swab     Status: None   Collection Time: 05/19/23  5:26 PM   Specimen: Anterior Nasal Swab  Result Value Ref Range   SARS Coronavirus 2 by RT PCR NEGATIVE NEGATIVE    Comment: (NOTE) SARS-CoV-2 target nucleic acids are NOT DETECTED.  The SARS-CoV-2 RNA is generally detectable in upper and lower respiratory specimens during the acute phase of infection. The lowest concentration of SARS-CoV-2 viral copies this assay can detect is 250 copies / mL. A negative result does not preclude SARS-CoV-2 infection and should not be used as the sole basis for treatment or other patient management decisions.  A negative result may occur with improper  specimen collection / handling, submission of specimen other than nasopharyngeal swab, presence of viral mutation(s) within the areas targeted by this assay, and inadequate number of viral copies (<250  copies / mL). A negative result must be combined with clinical observations, patient history, and epidemiological information.  Fact Sheet for Patients:   RoadLapTop.co.za  Fact Sheet for Healthcare Providers: http://kim-miller.com/  This test is not yet approved or  cleared by the [place] FDA and has been authorized for detection and/or diagnosis of SARS-CoV-2 by FDA under an Emergency Use Authorization (EUA).  This EUA will remain in effect (meaning this test can be used) for the duration of the COVID-19 declaration under Section 564(b)(1) of the Act, 21 U.S.C. section 360bbb-3(b)(1), unless the authorization is terminated or revoked sooner.  Performed at [facility], [address] [place], [place] [address]   Lactic acid, plasma     Status: Abnormal   Collection Time: 05/19/23  5:27 PM  Result Value Ref Range   Lactic Acid, Venous 6.5 (HH) 0.5 - 1.9 mmol/L    Comment: CRITICAL VALUE NOTED. VALUE IS CONSISTENT WITH PREVIOUSLY REPORTED/CALLED VALUE SKL Performed at [facility], [address] [place], [place] [address]   Troponin I (High Sensitivity)     Status: Abnormal   Collection Time: 05/19/23  5:27 PM  Result Value Ref Range   Troponin I (High Sensitivity) 70 (H) <18 ng/L    Comment: (NOTE) Elevated high sensitivity troponin I (hsTnI) values and significant  changes across serial measurements may suggest ACS but many other  chronic and acute conditions are known to elevate hsTnI results.  Refer to the "Links" section for chest pain algorithms and additional  guidance. Performed at [facility], [address] [place], [place] [address]    DG Chest [place] 1 View  Result Date: 05/19/2023 CLINICAL DATA:  Questionable sepsis. EXAM: PORTABLE CHEST 1 VIEW COMPARISON:  Chest radiograph dated 12/25/2021 and CT dated 02/18/2022. FINDINGS: There is background of chronic interstitial  coarsening. An area of increased opacity in the left mid lung field most concerning for developing infiltrate. Additional smaller density in the right lower lung field also concerning for infiltrate. No pleural effusion or pneumothorax. The cardiac silhouette is within normal limits. Median sternotomy wires and mechanical cardiac valve. Atherosclerotic calcification of the aorta. No acute osseous pathology. IMPRESSION: Bilateral pulmonary infiltrates, left greater than right. Electronically Signed   By: Elgie Collard M.D.   On: 05/19/2023 18:42    Pending Labs Unresulted Labs (From admission, onward)     Start     Ordered   05/20/23 0500  Basic metabolic panel  Tomorrow morning,   R        05/19/23 1821   05/20/23 0500  CBC  Tomorrow morning,   R        05/19/23 1821   05/20/23 0500  Magnesium  Tomorrow morning,   R        05/19/23 1821   05/20/23 0500  Phosphorus  Tomorrow morning,   R        05/19/23 1821   05/19/23 1901  Procalcitonin  Add-on,   AD       References:    Procalcitonin Lower Respiratory Tract Infection AND Sepsis Procalcitonin Algorithm   05/19/23 1900   05/19/23 1900  Respiratory (~20 pathogens) panel by PCR  (Respiratory panel by PCR (~20 pathogens, ~24 hr TAT)  w precautions)  Once,   R  05/19/23 1859   05/19/23 1856  Magnesium  Add-on,   AD        05/19/23 1855   05/19/23 1856  Phosphorus  Add-on,   AD        05/19/23 1855   05/19/23 1844  TSH  Once,   R        05/19/23 1844   05/19/23 1844  T4, free  Once,   R        05/19/23 1844   05/19/23 1840  Blood gas, venous  ONCE - STAT,   STAT        05/19/23 1839   05/19/23 1823  Expectorated Sputum Assessment w Gram Stain, Rflx to Resp Cult  Once,   R        05/19/23 1822   05/19/23 1822  Hemoglobin A1c  (Glycemic Control (SSI)  Q 4 Hours / Glycemic Control (SSI)  AC +/- HS)  Once,   R       Comments: To assess prior glycemic control    05/19/23 1821   05/19/23 1822  Brain natriuretic peptide  ONCE - STAT,    STAT        05/19/23 1821   05/19/23 1820  Legionella Pneumophila Serogp 1 Ur Ag  Once,   R        05/19/23 1821   05/19/23 1717  Blood culture (routine x 2)  BLOOD CULTURE X 2,   STAT      05/19/23 1717   05/19/23 1522  Urinalysis, w/ Reflex to Culture (Infection Suspected) -Urine, Clean Catch  (Undifferentiated presentation (screening labs and basic nursing orders))  ONCE - URGENT,   URGENT       Question:  Specimen Source  Answer:  Urine, Clean Catch   05/19/23 1521            Vitals/Pain Today's Vitals   05/19/23 1815 05/19/23 1820 05/19/23 1825 05/19/23 1830  BP: (!) 79/41 (!) 87/44 (!) 83/46 (!) 94/45  Pulse: (!) 106 (!) 107 (!) 107 (!) 105  Resp: 15 13 18 19   Temp:      TempSrc:      SpO2: 94% 96% 94% 99%  Weight:      PainSc:        Isolation Precautions Droplet precaution  Medications Medications  cefTRIAXone (ROCEPHIN) 2 g in sodium chloride 0.9 % 100 mL IVPB (0 g Intravenous Stopped 05/19/23 1741)  azithromycin (ZITHROMAX) 500 mg in dextrose 5 % 250 mL IVPB (0 mg Intravenous Stopped 05/19/23 1824)  norepinephrine (LEVOPHED) 4mg  in (0.016 mg/mL) premix infusion (14 mcg/min Intravenous Rate/Dose Change 05/19/23 1829)  docusate sodium (COLACE) capsule 100 mg (has no administration in time range)  polyethylene glycol (MIRALAX / GLYCOLAX) packet 17 g (has no administration in time range)  acetaminophen (TYLENOL) tablet 650 mg (has no administration in time range)  ondansetron (ZOFRAN) injection 4 mg (has no administration in time range)  insulin aspart (novoLOG) injection 0-9 Units (has no administration in time range)  insulin aspart (novoLOG) injection 0-5 Units (has no administration in time range)  potassium chloride 10 mEq in 100 mL IVPB (has no administration in time range)  guaiFENesin (ROBITUSSIN) 100 MG/5ML liquid 5 mL (has no administration in time range)  ipratropium-albuterol (DUONEB) 0.5-2.5 (3) MG/3ML nebulizer solution 3 mL (has no  administration in time range)  sodium chloride 0.9 % bolus 1,000 mL (0 mLs Intravenous Stopped 05/19/23 1629)  sodium chloride 0.9 % bolus 1,000 mL (0 mLs Intravenous  Stopped 05/19/23 1725)  0.9 %  sodium chloride infusion ( Intravenous New Bag/Given 05/19/23 1823)    Mobility   walks but generally weak now    Focused Assessments Pulmonary Assessment Handoff:  Lung sounds: Bilateral Breath Sounds: Diminished, Coarse crackles O2 Device: Nasal Cannula O2 Flow Rate (L/min): 2 L/min    R Recommendations: See Admitting Provider Note  Report given to:   Additional Notes: A&Ox3 with some confusion, husband at BS, [place] 2L, sepsis PNA, productive cough, Is & Os complete, 2L bolus, then levophed

## 2023-05-19 NOTE — Sepsis Progress Note (Signed)
Notified provider of need to order blood cultures.  

## 2023-05-19 NOTE — ED Notes (Signed)
SBP at target above 90, MAP remains low 62

## 2023-05-19 NOTE — Progress Notes (Signed)
eLink Physician-Brief Progress Note Patient Name: Jenny [name] DOB: 05/19/1946 MRN: [id number]   Date of Service  05/19/2023  HPI/Events of Note  77 year old with a history of diabetes, dyslipidemia, IBS, severe aortic aortic stenosis and chronic heart failure that initially presented to the hospital with septic shock secondary to pneumonia requiring norepinephrine.  On examination, she is tachypneic, tachycardic, and mildly hypotensive requiring norepinephrine infusion.  Administered broad-spectrum antibiotics.  Laboratory studies consistent with mildly elevated creatinine, hypomagnesemia, hypokalemia, and mild elevated BNP.  Chest radiograph with bilateral infiltrates.  No acute intra-abdominal abnormalities on KUB.  eICU Interventions  Continue to trend troponin, lactic acid  Procalcitonin grossly elevated, trend  Consider additional oral rehydration therapy  Norepinephrine as needed to maintain MAP greater than 65  DVT prophylaxis with SCDs, consider initiation of chemoprophylaxis. GI prophylaxis not indicated     Intervention Category Evaluation Type: New Patient Evaluation  Courtez Twaddle 05/19/2023, 10:09 PM

## 2023-05-19 NOTE — ED Notes (Signed)
EDP at BS 

## 2023-05-20 ENCOUNTER — Inpatient Hospital Stay: Payer: Medicare Other

## 2023-05-20 ENCOUNTER — Observation Stay: Payer: Medicare Other

## 2023-05-20 ENCOUNTER — Encounter: Payer: Self-pay | Admitting: Pulmonary Disease

## 2023-05-20 ENCOUNTER — Observation Stay (HOSPITAL_COMMUNITY)
Admit: 2023-05-20 | Discharge: 2023-05-20 | Disposition: A | Discharge status: Home / Self Care | Payer: Medicare Other | Attending: Critical Care Medicine | Admitting: Critical Care Medicine

## 2023-05-20 DIAGNOSIS — Z953 Presence of xenogenic heart valve: Secondary | ICD-10-CM | POA: Diagnosis not present

## 2023-05-20 DIAGNOSIS — R7989 Other specified abnormal findings of blood chemistry: Secondary | ICD-10-CM | POA: Diagnosis not present

## 2023-05-20 DIAGNOSIS — I350 Nonrheumatic aortic (valve) stenosis: Secondary | ICD-10-CM | POA: Diagnosis not present

## 2023-05-20 DIAGNOSIS — Z952 Presence of prosthetic heart valve: Secondary | ICD-10-CM | POA: Diagnosis not present

## 2023-05-20 DIAGNOSIS — I708 Atherosclerosis of other arteries: Secondary | ICD-10-CM | POA: Diagnosis not present

## 2023-05-20 DIAGNOSIS — I10 Essential (primary) hypertension: Secondary | ICD-10-CM | POA: Diagnosis present

## 2023-05-20 DIAGNOSIS — R918 Other nonspecific abnormal finding of lung field: Secondary | ICD-10-CM | POA: Diagnosis not present

## 2023-05-20 DIAGNOSIS — Z471 Aftercare following joint replacement surgery: Secondary | ICD-10-CM | POA: Diagnosis not present

## 2023-05-20 DIAGNOSIS — E872 Acidosis, unspecified: Secondary | ICD-10-CM | POA: Diagnosis present

## 2023-05-20 DIAGNOSIS — I672 Cerebral atherosclerosis: Secondary | ICD-10-CM | POA: Diagnosis not present

## 2023-05-20 DIAGNOSIS — D649 Anemia, unspecified: Secondary | ICD-10-CM | POA: Diagnosis not present

## 2023-05-20 DIAGNOSIS — R1110 Vomiting, unspecified: Secondary | ICD-10-CM | POA: Diagnosis present

## 2023-05-20 DIAGNOSIS — I5023 Acute on chronic systolic (congestive) heart failure: Secondary | ICD-10-CM | POA: Diagnosis not present

## 2023-05-20 DIAGNOSIS — M069 Rheumatoid arthritis, unspecified: Secondary | ICD-10-CM | POA: Diagnosis present

## 2023-05-20 DIAGNOSIS — J157 Pneumonia due to Mycoplasma pneumoniae: Secondary | ICD-10-CM | POA: Diagnosis present

## 2023-05-20 DIAGNOSIS — J690 Pneumonitis due to inhalation of food and vomit: Secondary | ICD-10-CM | POA: Diagnosis not present

## 2023-05-20 DIAGNOSIS — N170 Acute kidney failure with tubular necrosis: Secondary | ICD-10-CM | POA: Diagnosis present

## 2023-05-20 DIAGNOSIS — R7401 Elevation of levels of liver transaminase levels: Secondary | ICD-10-CM | POA: Diagnosis not present

## 2023-05-20 DIAGNOSIS — I214 Non-ST elevation (NSTEMI) myocardial infarction: Secondary | ICD-10-CM | POA: Diagnosis not present

## 2023-05-20 DIAGNOSIS — E876 Hypokalemia: Secondary | ICD-10-CM | POA: Diagnosis not present

## 2023-05-20 DIAGNOSIS — J849 Interstitial pulmonary disease, unspecified: Secondary | ICD-10-CM | POA: Diagnosis not present

## 2023-05-20 DIAGNOSIS — I517 Cardiomegaly: Secondary | ICD-10-CM | POA: Diagnosis not present

## 2023-05-20 DIAGNOSIS — E871 Hypo-osmolality and hyponatremia: Secondary | ICD-10-CM | POA: Diagnosis not present

## 2023-05-20 DIAGNOSIS — R0902 Hypoxemia: Secondary | ICD-10-CM | POA: Diagnosis not present

## 2023-05-20 DIAGNOSIS — K7460 Unspecified cirrhosis of liver: Secondary | ICD-10-CM | POA: Diagnosis present

## 2023-05-20 DIAGNOSIS — E669 Obesity, unspecified: Secondary | ICD-10-CM | POA: Diagnosis present

## 2023-05-20 DIAGNOSIS — A4159 Other Gram-negative sepsis: Secondary | ICD-10-CM | POA: Diagnosis present

## 2023-05-20 DIAGNOSIS — R188 Other ascites: Secondary | ICD-10-CM | POA: Diagnosis present

## 2023-05-20 DIAGNOSIS — I340 Nonrheumatic mitral (valve) insufficiency: Secondary | ICD-10-CM | POA: Diagnosis not present

## 2023-05-20 DIAGNOSIS — J9601 Acute respiratory failure with hypoxia: Secondary | ICD-10-CM | POA: Diagnosis not present

## 2023-05-20 DIAGNOSIS — N179 Acute kidney failure, unspecified: Secondary | ICD-10-CM | POA: Diagnosis not present

## 2023-05-20 DIAGNOSIS — D696 Thrombocytopenia, unspecified: Secondary | ICD-10-CM | POA: Diagnosis present

## 2023-05-20 DIAGNOSIS — R140 Abdominal distension (gaseous): Secondary | ICD-10-CM | POA: Diagnosis not present

## 2023-05-20 DIAGNOSIS — R0602 Shortness of breath: Secondary | ICD-10-CM | POA: Diagnosis not present

## 2023-05-20 DIAGNOSIS — J90 Pleural effusion, not elsewhere classified: Secondary | ICD-10-CM | POA: Diagnosis not present

## 2023-05-20 DIAGNOSIS — J189 Pneumonia, unspecified organism: Secondary | ICD-10-CM | POA: Diagnosis not present

## 2023-05-20 DIAGNOSIS — J9691 Respiratory failure, unspecified with hypoxia: Secondary | ICD-10-CM | POA: Diagnosis not present

## 2023-05-20 DIAGNOSIS — R64 Cachexia: Secondary | ICD-10-CM | POA: Diagnosis present

## 2023-05-20 DIAGNOSIS — Z66 Do not resuscitate: Secondary | ICD-10-CM | POA: Diagnosis not present

## 2023-05-20 DIAGNOSIS — R0989 Other specified symptoms and signs involving the circulatory and respiratory systems: Secondary | ICD-10-CM | POA: Diagnosis not present

## 2023-05-20 DIAGNOSIS — I2489 Other forms of acute ischemic heart disease: Secondary | ICD-10-CM | POA: Diagnosis present

## 2023-05-20 DIAGNOSIS — E039 Hypothyroidism, unspecified: Secondary | ICD-10-CM | POA: Diagnosis present

## 2023-05-20 DIAGNOSIS — Z7189 Other specified counseling: Secondary | ICD-10-CM | POA: Diagnosis not present

## 2023-05-20 DIAGNOSIS — E11649 Type 2 diabetes mellitus with hypoglycemia without coma: Secondary | ICD-10-CM | POA: Diagnosis present

## 2023-05-20 DIAGNOSIS — J8410 Pulmonary fibrosis, unspecified: Secondary | ICD-10-CM | POA: Diagnosis not present

## 2023-05-20 DIAGNOSIS — I700 Atherosclerosis of aorta: Secondary | ICD-10-CM | POA: Diagnosis not present

## 2023-05-20 DIAGNOSIS — J984 Other disorders of lung: Secondary | ICD-10-CM | POA: Diagnosis not present

## 2023-05-20 DIAGNOSIS — A419 Sepsis, unspecified organism: Secondary | ICD-10-CM | POA: Diagnosis not present

## 2023-05-20 DIAGNOSIS — J188 Other pneumonia, unspecified organism: Secondary | ICD-10-CM | POA: Diagnosis not present

## 2023-05-20 DIAGNOSIS — I6523 Occlusion and stenosis of bilateral carotid arteries: Secondary | ICD-10-CM | POA: Diagnosis not present

## 2023-05-20 DIAGNOSIS — K7689 Other specified diseases of liver: Secondary | ICD-10-CM | POA: Diagnosis not present

## 2023-05-20 DIAGNOSIS — R590 Localized enlarged lymph nodes: Secondary | ICD-10-CM | POA: Diagnosis not present

## 2023-05-20 DIAGNOSIS — I342 Nonrheumatic mitral (valve) stenosis: Secondary | ICD-10-CM | POA: Diagnosis not present

## 2023-05-20 DIAGNOSIS — J811 Chronic pulmonary edema: Secondary | ICD-10-CM | POA: Diagnosis not present

## 2023-05-20 DIAGNOSIS — D509 Iron deficiency anemia, unspecified: Secondary | ICD-10-CM | POA: Diagnosis present

## 2023-05-20 DIAGNOSIS — E43 Unspecified severe protein-calorie malnutrition: Secondary | ICD-10-CM | POA: Diagnosis present

## 2023-05-20 DIAGNOSIS — A4189 Other specified sepsis: Secondary | ICD-10-CM | POA: Diagnosis not present

## 2023-05-20 DIAGNOSIS — F32A Depression, unspecified: Secondary | ICD-10-CM | POA: Diagnosis present

## 2023-05-20 DIAGNOSIS — R6521 Severe sepsis with septic shock: Secondary | ICD-10-CM | POA: Diagnosis present

## 2023-05-20 DIAGNOSIS — Z515 Encounter for palliative care: Secondary | ICD-10-CM | POA: Diagnosis not present

## 2023-05-20 DIAGNOSIS — Z1152 Encounter for screening for COVID-19: Secondary | ICD-10-CM | POA: Diagnosis not present

## 2023-05-20 DIAGNOSIS — Z9049 Acquired absence of other specified parts of digestive tract: Secondary | ICD-10-CM | POA: Diagnosis not present

## 2023-05-20 DIAGNOSIS — J9621 Acute and chronic respiratory failure with hypoxia: Secondary | ICD-10-CM | POA: Diagnosis present

## 2023-05-20 DIAGNOSIS — R29818 Other symptoms and signs involving the nervous system: Secondary | ICD-10-CM | POA: Diagnosis not present

## 2023-05-20 DIAGNOSIS — I050 Rheumatic mitral stenosis: Secondary | ICD-10-CM | POA: Diagnosis not present

## 2023-05-20 DIAGNOSIS — Z452 Encounter for adjustment and management of vascular access device: Secondary | ICD-10-CM | POA: Diagnosis not present

## 2023-05-20 LAB — URINALYSIS, W/ REFLEX TO CULTURE (INFECTION SUSPECTED)
Bilirubin Urine: NEGATIVE
Glucose, UA: NEGATIVE mg/dL
Ketones, ur: 5 mg/dL — AB
Nitrite: NEGATIVE
Protein, ur: >=300 mg/dL — AB
Specific Gravity, Urine: 1.017 (ref 1.005–1.030)
WBC, UA: >50 WBC/hpf (ref 0–5)
pH: 5 (ref 5.0–8.0)

## 2023-05-20 LAB — GLUCOSE, CAPILLARY
Glucose-Capillary: 107 mg/dL — ABNORMAL HIGH (ref 70–99)
Glucose-Capillary: 84 mg/dL (ref 70–99)
Glucose-Capillary: 71 mg/dL (ref 70–99)
Glucose-Capillary: 111 mg/dL — ABNORMAL HIGH (ref 70–99)
Glucose-Capillary: 86 mg/dL (ref 70–99)

## 2023-05-20 LAB — BASIC METABOLIC PANEL
Anion gap: 8 (ref 5–15)
Anion gap: 7 (ref 5–15)
BUN: 20 mg/dL (ref 8–23)
BUN: 27 mg/dL — ABNORMAL HIGH (ref 8–23)
CO2: 20 mmol/L — ABNORMAL LOW (ref 22–32)
CO2: 20 mmol/L — ABNORMAL LOW (ref 22–32)
Calcium: 7.6 mg/dL — ABNORMAL LOW (ref 8.9–10.3)
Calcium: 7.4 mg/dL — ABNORMAL LOW (ref 8.9–10.3)
Chloride: 108 mmol/L (ref 98–111)
Chloride: 106 mmol/L (ref 98–111)
Creatinine, Ser: 1.27 mg/dL — ABNORMAL HIGH (ref 0.44–1.00)
Creatinine, Ser: 1.26 mg/dL — ABNORMAL HIGH (ref 0.44–1.00)
GFR, Estimated: 44 mL/min — ABNORMAL LOW (ref >60)
GFR, Estimated: 45 mL/min — ABNORMAL LOW (ref >60)
Glucose, Bld: 113 mg/dL — ABNORMAL HIGH (ref 70–99)
Glucose, Bld: 88 mg/dL (ref 70–99)
Potassium: 3.4 mmol/L — ABNORMAL LOW (ref 3.5–5.1)
Potassium: 5 mmol/L (ref 3.5–5.1)
Sodium: 136 mmol/L (ref 135–145)
Sodium: 133 mmol/L — ABNORMAL LOW (ref 135–145)

## 2023-05-20 LAB — RESPIRATORY PANEL BY PCR

## 2023-05-20 LAB — C DIFFICILE QUICK SCREEN W PCR REFLEX
C Diff antigen: NEGATIVE
C Diff interpretation: NOT DETECTED
C Diff toxin: NEGATIVE

## 2023-05-20 LAB — ECHOCARDIOGRAM COMPLETE
AR max vel: 2.04 cm2
AV Area VTI: 1.96 cm2
AV Area mean vel: 1.96 cm2
AV Mean grad: 15.3 mm[Hg]
AV Peak grad: 27.5 mm[Hg]
Ao pk vel: 2.62 m/s
Area-P 1/2: 3.2 cm2
Calc EF: 49.4 %
MV M vel: 5.35 m/s
MV Peak grad: 114.5 mm[Hg]
MV VTI: 1.09 cm2
Radius: 0.4 cm
S' Lateral: 2.6 cm
Single Plane A2C EF: 47.5 %
Single Plane A4C EF: 52.1 %
Weight: 1604.95 [oz_av]

## 2023-05-20 LAB — CBC
HCT: 30.5 % — ABNORMAL LOW (ref 36.0–46.0)
Hemoglobin: 10 g/dL — ABNORMAL LOW (ref 12.0–15.0)
MCH: 29.4 pg (ref 26.0–34.0)
MCHC: 32.8 g/dL (ref 30.0–36.0)
MCV: 89.7 fL (ref 80.0–100.0)
Platelets: 85 10*3/uL — ABNORMAL LOW (ref 150–400)
RBC: 3.4 MIL/uL — ABNORMAL LOW (ref 3.87–5.11)
RDW: 15.9 % — ABNORMAL HIGH (ref 11.5–15.5)
WBC: 14.8 10*3/uL — ABNORMAL HIGH (ref 4.0–10.5)
nRBC: 0 % (ref 0.0–0.2)

## 2023-05-20 LAB — TROPONIN I (HIGH SENSITIVITY)
Troponin I (High Sensitivity): 271 ng/L (ref <18)
Troponin I (High Sensitivity): 718 ng/L (ref <18)
Troponin I (High Sensitivity): 848 ng/L (ref <18)
Troponin I (High Sensitivity): 777 ng/L (ref <18)

## 2023-05-20 LAB — HEPATIC FUNCTION PANEL
ALT: 256 U/L — ABNORMAL HIGH (ref 0–44)
AST: 874 U/L — ABNORMAL HIGH (ref 15–41)
Albumin: 2.4 g/dL — ABNORMAL LOW (ref 3.5–5.0)
Alkaline Phosphatase: 49 U/L (ref 38–126)
Bilirubin, Direct: 0.4 mg/dL — ABNORMAL HIGH (ref 0.0–0.2)
Indirect Bilirubin: 0.5 mg/dL (ref 0.3–0.9)
Total Bilirubin: 0.9 mg/dL (ref <1.2)
Total Protein: 5.4 g/dL — ABNORMAL LOW (ref 6.5–8.1)

## 2023-05-20 LAB — PHOSPHORUS
Phosphorus: 1.2 mg/dL — ABNORMAL LOW (ref 2.5–4.6)
Phosphorus: 3.9 mg/dL (ref 2.5–4.6)

## 2023-05-20 LAB — LACTIC ACID, PLASMA
Lactic Acid, Venous: 4.8 mmol/L (ref 0.5–1.9)
Lactic Acid, Venous: 4.6 mmol/L (ref 0.5–1.9)
Lactic Acid, Venous: 3.5 mmol/L (ref 0.5–1.9)

## 2023-05-20 LAB — HEMOGLOBIN A1C
Hgb A1c MFr Bld: 5.3 % (ref 4.8–5.6)
Mean Plasma Glucose: 105.41 mg/dL

## 2023-05-20 LAB — C-REACTIVE PROTEIN: CRP: 16.7 mg/dL — ABNORMAL HIGH (ref <1.0)

## 2023-05-20 LAB — MAGNESIUM: Magnesium: 2.7 mg/dL — ABNORMAL HIGH (ref 1.7–2.4)

## 2023-05-20 LAB — MRSA NEXT GEN BY PCR, NASAL: MRSA by PCR Next Gen: NOT DETECTED

## 2023-05-20 LAB — STREP PNEUMONIAE URINARY ANTIGEN: Strep Pneumo Urinary Antigen: NEGATIVE

## 2023-05-20 MED ORDER — HEPARIN SODIUM (PORCINE) 5000 UNIT/ML IJ SOLN
5000.0000 [IU] | Freq: Two times a day (BID) | INTRAMUSCULAR | Status: DC
  Administered 2023-05-20 – 2023-06-01 (×25): 5000 [IU] via SUBCUTANEOUS
  Filled 2023-05-20 (×25): qty 1

## 2023-05-20 MED ORDER — CHLORHEXIDINE GLUCONATE CLOTH 2 % EX PADS
6.0000 | MEDICATED_PAD | Freq: Every day | CUTANEOUS | Status: DC
  Administered 2023-05-20 – 2023-06-02 (×14): 6 via TOPICAL

## 2023-05-20 MED ORDER — SERTRALINE HCL 50 MG PO TABS
100.0000 mg | ORAL_TABLET | Freq: Every day | ORAL | Status: DC
  Administered 2023-05-20 – 2023-05-29 (×8): 100 mg via ORAL
  Filled 2023-05-20 (×10): qty 2

## 2023-05-20 MED ORDER — ORAL CARE MOUTH RINSE: 15.0000 mL | OROMUCOSAL | Status: DC | PRN

## 2023-05-20 MED ORDER — ASPIRIN 81 MG PO CHEW
81.0000 mg | CHEWABLE_TABLET | Freq: Every day | ORAL | Status: DC
  Administered 2023-05-20 – 2023-05-31 (×9): 81 mg via ORAL
  Filled 2023-05-20 (×10): qty 1

## 2023-05-20 MED ORDER — ALPRAZOLAM 0.5 MG PO TABS
0.5000 mg | ORAL_TABLET | Freq: Two times a day (BID) | ORAL | Status: DC
  Administered 2023-05-20: 0.5 mg via ORAL
  Filled 2023-05-20: qty 1

## 2023-05-20 MED ORDER — THIAMINE HCL 100 MG/ML IJ SOLN
100.0000 mg | Freq: Every day | INTRAMUSCULAR | Status: DC
  Administered 2023-05-20: 100 mg via INTRAVENOUS
  Filled 2023-05-20: qty 2

## 2023-05-20 MED ORDER — MORPHINE SULFATE (PF) 2 MG/ML IV SOLN
INTRAVENOUS | Status: AC
  Administered 2023-05-20: 2 mg via INTRAVENOUS
  Filled 2023-05-20: qty 1

## 2023-05-20 MED ORDER — HALOPERIDOL LACTATE 5 MG/ML IJ SOLN
5.0000 mg | Freq: Four times a day (QID) | INTRAMUSCULAR | Status: DC | PRN
  Administered 2023-05-20: 5 mg via INTRAVENOUS
  Filled 2023-05-20: qty 1

## 2023-05-20 MED ORDER — HYDROXYZINE HCL 25 MG PO TABS
25.0000 mg | ORAL_TABLET | Freq: Two times a day (BID) | ORAL | Status: DC
  Administered 2023-05-20: 25 mg via ORAL
  Filled 2023-05-20: qty 1

## 2023-05-20 MED ORDER — MORPHINE SULFATE (PF) 2 MG/ML IV SOLN: 2.0000 mg | Freq: Once | INTRAVENOUS | Status: AC

## 2023-05-20 MED ORDER — DONEPEZIL HCL 5 MG PO TABS
10.0000 mg | ORAL_TABLET | Freq: Every day | ORAL | Status: DC
  Administered 2023-05-20 – 2023-05-29 (×8): 10 mg via ORAL
  Filled 2023-05-20 (×9): qty 2

## 2023-05-20 MED ORDER — LEVOTHYROXINE SODIUM 88 MCG PO TABS
88.0000 ug | ORAL_TABLET | Freq: Every day | ORAL | Status: DC
  Administered 2023-05-21 – 2023-05-31 (×9): 88 ug via ORAL
  Filled 2023-05-20 (×13): qty 1

## 2023-05-20 NOTE — Progress Notes (Signed)
NAME:  Jenny [name], MRN:  [id number], DOB:  [birth year], LOS: 0 ADMISSION DATE:  05/19/2023, CONSULTATION DATE: 05/19/2023 REFERRING MD: Dr. Roxan Hockey, CHIEF COMPLAINT: Emesis   History of Present Illness:  This is a 77 yo female who presented to [facility] ER on 11/18 via EMS with c/o "not feeling well", nausea, and vomiting.  EMS reported when they arrived at pts home she was lethargic and diaphoretic with generalized weakness.  She received iv fluids en route to the ER.  Pts husband reports the pt has had a cough over the past 2 weeks, however her cough worsened today and she vomited bile prompting EMS notification.  ED Course  Upon arrival to the ER pt hypotensive and tachycardic meeting sepsis criteria.  She received 2L NS bolus/azithromycin/ceftriaxone, however she remained hypotensive requiring levophed gtt.  CXR concerning for pneumonia.  Significant lab results were: K+ 3.1/CO2 18/glucose 177/creatinine 1.08/calcium 8.3/albumin 2.6/AST 168/ALT 56/ troponin 55/lactic acid 6.3/wbc 11.9/hgb 11.1/platelets 65.  PCCM team contacted for ICU admission.     Pertinent  Medical History  Anxiety  Arthritis  CAD  Chronic Systolic CHF Depression  Type II Diabetes Mellitus  HLD Hypothyroidism  IBS Iron Deficiency Anemia  Osteoporosis  Panic Attacks  Severe Aortic Stenosis  Stroke  TIA   Significant Hospital Events: Including procedures, antibiotic start and stop dates in addition to other pertinent events   11/18: Pt admitted with septic shock secondary to pneumonia requiring levophed gtt   Interim History / Subjective:  Pt with frequent productive cough and complains of worsening chest pain with coughing consistent with pleuritic chest pain.  Pt currently requiring levophed gtt @4mcg /min to maintain map 65 or higher  Objective   Blood pressure (!) 91/48, pulse 96, temperature 99.6 F (37.6 C), temperature source Oral, resp. rate 18, weight 45.5 kg, SpO2 94%.    FiO2 (%):  [100 %]  100 %   Intake/Output Summary (Last 24 hours) at 05/20/2023 0801 Last data filed at 05/20/2023 0759 Gross per 24 hour  Intake 4411.68 ml  Output 55 ml  Net 4356.68 ml   Filed Weights   05/19/23 1512 05/20/23 0500  Weight: 43.5 kg 45.5 kg    Examination: General: Acute on chronically-ill appearing cachetic female, NAD on 2L O2 HENT: Supple, no JVD  Lungs: Diffuse rhonchi throughout, even, non labored  Cardiovascular: Sinus tachycardia, s1s2, no m/r/g, 2+ radial/1+ distal pulses, no edema  Abdomen: +BS x4, non tender, non distended  Extremities: Moves all extremities  Skin: Generalized abrasions present on admission Neuro: Alert, confused to place, following commands, PERRLA  GU: Deferred  IMAGING     Narrative & Impression  CLINICAL DATA:  Pneumonia   EXAM: PORTABLE CHEST 1 VIEW   COMPARISON:  Yesterday   FINDINGS: Extensive interstitial and airspace density. Cardiomegaly. Aortic valve replacement. A small left pleural effusion is likely. Clear apical lungs.   IMPRESSION: 1. Worsening aeration, generalized appearance and rapid progression favoring edema. There is asymmetric left-sided airspace disease and pneumonia may be coexistent. 2. Background of chronic lung disease and mild fibrosis by prior chest CT imaging     Electronically Signed   By: Tiburcio Pea M.D.   On: 05/20/2023 05:11    Assessment & Plan:   #Acute respiratory failure secondary to pneumonia  - Supplemental O2 for dyspnea and/or hypoxia  - Maintain O2 sats 92% or above - Chest PT - Prn bronchodilator therapy -crp and procalcitonin trending  #Septic shock- PRESENT ON ADMISSION - DUE TO PNEUMONIA #  Elevated troponin suspect secondary to demand ischemia  #Chronic systolic CHF  Hx: CAD, HLD, Stroke, Severe Aortic Stenosis, and TIA  - Continuous telemetry monitoring  - Trend troponin's until peaked  - Prn levophed gtt to maintain map 65 or higher  - BNP pending - Echo pending  - TSH  and free T4 pending   #Mild acute kidney injury secondary ATN  #Metabolic acidosis  #Lactic acidosis  #Hypokalemia  - Trend BMP and lactic acid  - Stat VBG, magnesium, and phosphorous pending  - Replace electrolytes as indicated  - Strict intake and output  - Avoid hepatotoxic medications as able   #Transaminitis - Trend hepatic function panel  - Avoid hepatotoxic medications as able   #Sepsis  #Pneumonia  - Trend WBC and monitor fever curve  - Trend PCT  - Follow cultures  - Continue azithromycin and rocephin   #Iron deficiency anemia  #Acute on chronic thrombocytopenia  - Trend CBC  - Monitor for s/sx of bleeding  - Transfuse for hgb <7 - Avoid chemical VTE px   #Nausea/vomiting  - Prn zofran for nausea/vomiting  - Clear liquid diet  - KUB pending   #Type II diabetes mellitus  - Hemoglobin A1c pending  - CBG's ac/hs  - SSI   Best Practice (right click and "Reselect all SmartList Selections" daily)   Diet/type: clear liquids DVT prophylaxis: SCD GI prophylaxis: N/A Lines: N/A Foley:  N/A Code Status:  full code Last date of multidisciplinary goals of care discussion [N/A]  11/18: Pt and pts husband updated regarding pts condition and current plan of care  Labs   CBC: Recent Labs  Lab 05/19/23 1559 05/20/23 0213  WBC 11.9* 14.8*  NEUTROABS 10.8*  --   HGB 11.1* 10.0*  HCT 33.9* 30.5*  MCV 92.1 89.7  PLT 65* 85*    Basic Metabolic Panel: Recent Labs  Lab 05/19/23 1559 05/19/23 1727 05/20/23 0213  NA 139  --  136  K 3.1*  --  3.4*  CL 108  --  108  CO2 18*  --  20*  GLUCOSE 177*  --  113*  BUN 15  --  20  CREATININE 1.08*  --  1.27*  CALCIUM 8.3*  --  7.6*  MG  --  1.1* 2.7*  PHOS  --  1.9* 1.2*   GFR: Estimated Creatinine Clearance: 24.5 mL/min (A) (by C-G formula based on SCr of 1.27 mg/dL (H)). Recent Labs  Lab 05/19/23 1559 05/19/23 1727 05/19/23 2303 05/20/23 0213 05/20/23 0450  PROCALCITON  --  31.96  --   --   --   WBC  11.9*  --   --  14.8*  --   LATICACIDVEN 6.3* 6.5* 4.8* 4.6* 3.5*    Liver Function Tests: Recent Labs  Lab 05/19/23 1559  AST 168*  ALT 56*  ALKPHOS 58  BILITOT 0.9  PROT 5.7*  ALBUMIN 2.6*   No results for input(s): "LIPASE", "AMYLASE" in the last 168 hours. No results for input(s): "AMMONIA" in the last 168 hours.  ABG    Component Value Date/Time   PHART 7.330 (L) 02/18/2017 1630   PCO2ART 38.5 02/18/2017 1630   PO2ART 72.0 (L) 02/18/2017 1630   HCO3 20.3 02/18/2017 1630   TCO2 25 02/19/2017 1657   ACIDBASEDEF 5.0 (H) 02/18/2017 1630   O2SAT 72.9 02/20/2017 0455     Coagulation Profile: Recent Labs  Lab 05/19/23 1559  INR 1.5*    Cardiac Enzymes: No results for input(s): "CKTOTAL", "  CKMB", "CKMBINDEX", "TROPONINI" in the last 168 hours.  HbA1C: Hemoglobin A1C  Date/Time Value Ref Range Status  02/19/2021 03:03 PM 5.6 4.0 - 5.6 % Final  10/19/2020 03:49 PM 6.2 (A) 4.0 - 5.6 % Final  10/23/2014 05:40 AM 8.6 (H) % Final    Comment:    4.0-6.0 NOTE: New Reference Range  09/06/14    HbA1c POC (<> result, manual entry)  Date/Time Value Ref Range Status  03/19/2022 02:57 PM 6.0 4.0 - 5.6 % Final   Hgb A1c MFr Bld  Date/Time Value Ref Range Status  05/19/2023 09:39 PM 5.3 4.8 - 5.6 % Final    Comment:    (NOTE) Pre diabetes:          5.7%-6.4%  Diabetes:              >6.4%  Glycemic control for   <7.0% adults with diabetes   05/23/2020 03:14 PM 5.6 4.8 - 5.6 % Final    Comment:             Prediabetes: 5.7 - 6.4          Diabetes: >6.4          Glycemic control for adults with diabetes: <7.0     CBG: Recent Labs  Lab 05/19/23 2056  GLUCAP 163*    Review of Systems: Positives in BOLD   Gen: poor po intake, fever, chills, weight change, fatigue, night sweats HEENT: Denies blurred vision, double vision, hearing loss, tinnitus, sinus congestion, rhinorrhea, sore throat, neck stiffness, dysphagia PULM: shortness of breath, cough, sputum  production, hemoptysis, wheezing CV: Denies chest pain, edema, orthopnea, paroxysmal nocturnal dyspnea, palpitations GI: abdominal pain, nausea, vomiting, diarrhea, hematochezia, melena, constipation, change in bowel habits GU: Denies dysuria, hematuria, polyuria, oliguria, urethral discharge Endocrine: Denies hot or cold intolerance, polyuria, polyphagia or appetite change Derm: Denies rash, dry skin, scaling or peeling skin change Heme: Denies easy bruising, bleeding, bleeding gums Neuro: Denies headache, numbness, weakness, slurred speech, loss of memory or consciousness  Past Medical History:  She,  has a past medical history of Anxiety, Arthritis, CAD (coronary artery disease), Chronic systolic CHF (congestive heart failure) (HCC), Depression, Diabetes mellitus with complication (HCC), Hyperlipidemia, Hypothyroidism, IBS (irritable bowel syndrome), Iron deficiency anemia, Microcytic anemia, Osteoporosis, Panic attacks, Severe aortic stenosis, Stroke (HCC) (2001), and TIA (transient ischemic attack) (2006).   Surgical History:   Past Surgical History:  Procedure Laterality Date   ABDOMINAL HYSTERECTOMY     AORTIC VALVE REPLACEMENT N/A 02/17/2017   Procedure: AORTIC VALVE REPLACEMENT (AVR);  Surgeon: Donata Clay, Theron Arista, MD;  Location: [facility];  Service: Open Heart Surgery;  Laterality: N/A;   CATARACT EXTRACTION W/PHACO Right 04/09/2016   Procedure: CATARACT EXTRACTION PHACO AND INTRAOCULAR LENS PLACEMENT (IOC);  Surgeon: Galen Manila, MD;  Location: [facility] ORS;  Service: Ophthalmology;  Laterality: Right;  [place] 57.2 AP% 18.6 CDE 10.64 Fluid Pack lot # [id number]   CATARACT EXTRACTION W/PHACO Left 12/29/2018   Procedure: CATARACT EXTRACTION PHACO AND INTRAOCULAR LENS PLACEMENT (IOC)  LEFT DIABETIC;  Surgeon: Galen Manila, MD;  Location: [facility];  Service: Ophthalmology;  Laterality: Left;  Diabetic - insuli and oral meds   CHOLECYSTECTOMY     COLONOSCOPY     CORONARY  ANGIOGRAPHY N/A 02/03/2017   Procedure: CORONARY ANGIOGRAPHY;  Surgeon: Iran Ouch, MD;  Location: [facility] INVASIVE CV LAB;  Service: Cardiovascular;  Laterality: N/A;   FRACTURE SURGERY     LEFT ANKLE   RIGHT AND LEFT HEART  CATH Bilateral 02/03/2017   Procedure: Right and Left Heart Cath;  Surgeon: Iran Ouch, MD;  Location: [facility] INVASIVE CV LAB;  Service: Cardiovascular;  Laterality: Bilateral;   TEE WITHOUT CARDIOVERSION N/A 02/17/2017   Procedure: TRANSESOPHAGEAL ECHOCARDIOGRAM (TEE);  Surgeon: Donata Clay, Theron Arista, MD;  Location: [facility] OR;  Service: Open Heart Surgery;  Laterality: N/A;   TIBIA IM NAIL INSERTION Left 05/19/2018   Procedure: INTRAMEDULLARY (IM) NAIL TIBIAL;  Surgeon: Juanell Fairly, MD;  Location: [facility] ORS;  Service: Orthopedics;  Laterality: Left;     Social History:   reports that she quit smoking about 23 years ago. Her smoking use included cigarettes. She started smoking about 53 years ago. She has never used smokeless tobacco. She reports that she does not drink alcohol and does not use drugs.   Family History:  Her family history includes Alzheimer's disease in her mother; Cancer in her maternal grandfather and maternal grandmother; Diabetes in her brother and father; Hypertension in her mother; Parkinson's disease in her mother. There is no history of Neuropathy.   Allergies Allergies  Allergen Reactions   Iodine Swelling    ANGIOEDEMA FACIAL SWELLING "BETADINE OKAY"   Shellfish Allergy Anaphylaxis     Home Medications  Prior to Admission medications   Medication Sig Start Date End Date Taking? Authorizing Provider  alendronate (FOSAMAX) 70 MG tablet Take 1 tablet (70 mg total) by mouth once a week. Take with a full glass of water on an empty stomach. 05/27/22   Corky Downs, MD  ALPRAZolam Prudy Feeler) 0.5 MG tablet Take 1 tablet (0.5 mg total) by mouth 2 (two) times daily. 07/03/22   Corky Downs, MD  Cholecalciferol (VITAMIN D3 PO) Take by mouth daily.     [provider]  Cranberry-Vitamin C (AZO CRANBERRY URINARY TRACT) 250-60 MG CAPS Take 1 each by mouth 2 (two) times daily. 04/30/22   Corky Downs, MD  donepezil (ARICEPT) 10 MG tablet Take 1 tablet (10 mg total) by mouth at bedtime. 05/19/23   Lomax, Amy, NP  glucose blood (ONETOUCH ULTRA) test strip TEST BLOOD SUGAR THREE TIMES DAILY 11/02/21   Corky Downs, MD  hydrOXYzine (ATARAX/VISTARIL) 25 MG tablet Take 25 mg by mouth 2 (two) times daily.     [provider]  insulin glargine (LANTUS) 100 UNIT/ML injection Inject 0.25-0.35 mLs (25-35 Units total) into the skin See admin instructions. 35 units every morning and 25 units at bedtime 07/03/22   Corky Downs, MD  Insulin Syringe-Needle U-100 (INSULIN SYRINGE .5CC/31GX5/16") 31G X 5/16" 0.5 ML MISC USE 1 NEEDLE TWICE DAILY 02/18/22   Corky Downs, MD  Lancets (ONETOUCH DELICA PLUS LANCET30G) MISC USE TO TEST BLOOD SUGAR TWICE DAILY 06/27/22   Corky Downs, MD  levocetirizine (XYZAL) 5 MG tablet TAKE 1 TABLET(5 MG) BY MOUTH EVERY EVENING 08/14/21   Corky Downs, MD  levothyroxine (SYNTHROID) 88 MCG tablet TAKE 1 TABLET BY MOUTH DAILY 08/28/21   Corky Downs, MD  Loperamide HCl (IMODIUM A-D PO) Take by mouth as needed.    [provider]  Melatonin 10 MG TABS Take by mouth at bedtime.    [provider]  metFORMIN (GLUCOPHAGE) 500 MG tablet Take 1 tablet (500 mg total) by mouth 2 (two) times daily. 10/01/21   Corky Downs, MD  rosuvastatin (CRESTOR) 10 MG tablet TAKE 1 TABLET BY MOUTH DAILY 03/28/22   Corky Downs, MD  sertraline (ZOLOFT) 100 MG tablet TAKE 1 TABLET(100 MG) BY MOUTH TWICE DAILY 05/13/22   Kara Dies, NP  sitaGLIPtin (JANUVIA) 100 MG tablet TAKE 1 TABLET BY MOUTH EVERY MORNING 07/02/22   Corky Downs, MD     Critical care provider statement:   Total critical care time: 33 minutes   Performed by: Karna Christmas MD   Critical care time was exclusive of separately billable procedures and  treating other patients.   Critical care was necessary to treat or prevent imminent or life-threatening deterioration.   Critical care was time spent personally by me on the following activities: development of treatment plan with patient and/or surrogate as well as nursing, discussions with consultants, evaluation of patient's response to treatment, examination of patient, obtaining history from patient or surrogate, ordering and performing treatments and interventions, ordering and review of laboratory studies, ordering and review of radiographic studies, pulse oximetry and re-evaluation of patient's condition.    Vida Rigger, M.D.  Pulmonary & Critical Care Medicine

## 2023-05-20 NOTE — Plan of Care (Signed)
  Problem: Skin Integrity: Goal: Risk for impaired skin integrity will decrease Outcome: Progressing   Problem: Clinical Measurements: Goal: Ability to maintain clinical measurements within normal limits will improve Outcome: Progressing Goal: Diagnostic test results will improve Outcome: Progressing   Problem: Pain Management: Goal: General experience of comfort will improve Outcome: Progressing   Problem: Fluid Volume: Goal: Ability to maintain a balanced intake and output will improve Outcome: Not Progressing

## 2023-05-20 NOTE — Progress Notes (Signed)
Zada Girt NP made aware of multiple loose stool, c diff was ordered.

## 2023-05-20 NOTE — Progress Notes (Addendum)
Zada Girt NP made aware cdiff was negative, labs resulted bun 27, creat 1.26, NA 133. Waiting for troponin to be redrawn.

## 2023-05-20 NOTE — Progress Notes (Signed)
*  PRELIMINARY RESULTS* Echocardiogram 2D Echocardiogram has been performed.  Jenny [name] 05/20/2023, 10:44 AM

## 2023-05-20 NOTE — Plan of Care (Signed)
  Problem: Skin Integrity: Goal: Risk for impaired skin integrity will decrease Outcome: Progressing   Problem: Tissue Perfusion: Goal: Adequacy of tissue perfusion will improve Outcome: Progressing   Problem: Pain Management: Goal: General experience of comfort will improve Outcome: Progressing   Problem: Fluid Volume: Goal: Ability to maintain a balanced intake and output will improve Outcome: Not Progressing   Problem: Metabolic: Goal: Ability to maintain appropriate glucose levels will improve Outcome: Not Progressing   Problem: Nutritional: Goal: Maintenance of adequate nutrition will improve Outcome: Not Progressing   Problem: Clinical Measurements: Goal: Will remain free from infection Outcome: Not Progressing Goal: Diagnostic test results will improve Outcome: Not Progressing Goal: Respiratory complications will improve Outcome: Not Progressing Goal: Cardiovascular complication will be avoided Outcome: Not Progressing   Problem: Nutrition: Goal: Adequate nutrition will be maintained Outcome: Not Progressing   Problem: Elimination: Goal: Will not experience complications related to urinary retention Outcome: Not Progressing

## 2023-05-20 NOTE — Progress Notes (Signed)
Troponin resulted 848, Zada Girt NP aware will place orders for cardiology

## 2023-05-20 NOTE — Evaluation (Signed)
Clinical/Bedside Swallow Evaluation Patient Details  Name: Jenny [name] MRN: [id number] Date of Birth: Nov 22, 1945  Today's Date: 05/20/2023 Time: SLP Start Time (ACUTE ONLY): 1305 SLP Stop Time (ACUTE ONLY): 1400 SLP Time Calculation (min) (ACUTE ONLY): 55 min  Past Medical History:  Past Medical History:  Diagnosis Date   Anxiety    Arthritis    CAD (coronary artery disease)    a. 01/2017 Cath: nonobs dzs.   Chronic systolic CHF (congestive heart failure) (HCC)    a. TTE 12/2016, EF 45-50%, probable hypokinesis of the mid apical anteroseptal, anterior, & apical myocardium, GR2DD, possibly bicuspid aortic valve that was moderately thickened w/ severely calcified leaflets, severe aortic stenosis w/ mean gradient 47 mmHg, valve area 0.52 cm, mild MR, mildly dilated LA   Depression    Diabetes mellitus with complication (HCC)    Tylenol   Hyperlipidemia    Hypothyroidism    IBS (irritable bowel syndrome)    Iron deficiency anemia    a. s/p pRBC x1 in 12/2016   Microcytic anemia    Osteoporosis    Panic attacks    Severe aortic stenosis    a. TTE 12/2016: possibly bicuspid aortic valve that was moderately thickened with severely calcified leaflets. There was severe aortic stenosis with a mean gradient of 47 mmHg and valve area of 0.52 cm; b. 01/2017 s/p AVR w/ 19 mm bioprosthetic Magna Ease pericardial tissue valve, ser# [id number].   Stroke [facility]) 2001   TIA (transient ischemic attack) 2006   Past Surgical History:  Past Surgical History:  Procedure Laterality Date   ABDOMINAL HYSTERECTOMY     AORTIC VALVE REPLACEMENT N/A 02/17/2017   Procedure: AORTIC VALVE REPLACEMENT (AVR);  Surgeon: Donata Clay, Theron Arista, MD;  Location: [facility] OR;  Service: Open Heart Surgery;  Laterality: N/A;   CATARACT EXTRACTION W/PHACO Right 04/09/2016   Procedure: CATARACT EXTRACTION PHACO AND INTRAOCULAR LENS PLACEMENT (IOC);  Surgeon: Galen Manila, MD;  Location: [facility] ORS;  Service: Ophthalmology;   Laterality: Right;  [place] 57.2 AP% 18.6 CDE 10.64 Fluid Pack lot # [id number]   CATARACT EXTRACTION W/PHACO Left 12/29/2018   Procedure: CATARACT EXTRACTION PHACO AND INTRAOCULAR LENS PLACEMENT (IOC)  LEFT DIABETIC;  Surgeon: Galen Manila, MD;  Location: [facility];  Service: Ophthalmology;  Laterality: Left;  Diabetic - insuli and oral meds   CHOLECYSTECTOMY     COLONOSCOPY     CORONARY ANGIOGRAPHY N/A 02/03/2017   Procedure: CORONARY ANGIOGRAPHY;  Surgeon: Iran Ouch, MD;  Location: [facility] INVASIVE CV LAB;  Service: Cardiovascular;  Laterality: N/A;   FRACTURE SURGERY     LEFT ANKLE   RIGHT AND LEFT HEART CATH Bilateral 02/03/2017   Procedure: Right and Left Heart Cath;  Surgeon: Iran Ouch, MD;  Location: [facility] INVASIVE CV LAB;  Service: Cardiovascular;  Laterality: Bilateral;   TEE WITHOUT CARDIOVERSION N/A 02/17/2017   Procedure: TRANSESOPHAGEAL ECHOCARDIOGRAM (TEE);  Surgeon: Donata Clay, Theron Arista, MD;  Location: [facility] OR;  Service: Open Heart Surgery;  Laterality: N/A;   TIBIA IM NAIL INSERTION Left 05/19/2018   Procedure: INTRAMEDULLARY (IM) NAIL TIBIAL;  Surgeon: Juanell Fairly, MD;  Location: [facility] ORS;  Service: Orthopedics;  Laterality: Left;   HPI:  Pt is a 77 y.o. female w/ past medical history chronic kidney pain, CAD, CHF, DMII, anxiety/depression, Panic attacks, stroke, IBS, and neuropsychiatric disorder.  She presented to [facility] ER on 11/18 via EMS with c/o "not feeling well", nausea, and vomiting.  EMS reported when they arrived at pts home she  was lethargic and diaphoretic with generalized weakness.  She received iv fluids en route to the ER.  Pt's husband reports the pt has had a cough over the past 2 weeks, however her cough worsened today and she vomited bile prompting EMS notification.  ED Course   Upon arrival to the ER pt hypotensive and tachycardic meeting sepsis criteria.    CXR Imaging this morning:  Worsening aeration, generalized appearance and rapid  progression  favoring edema. There is asymmetric left-sided airspace disease and  pneumonia may be coexistent.  2. Background of chronic lung disease and mild fibrosis by prior  chest CT imaging.     Assessment / Plan / Recommendation  Clinical Impression   Pt seen for BSE today. Pt awake, verbally responded. Noted to be drowsy and often closed eyes and dozed off when not given stimulation -- Husband stated the same at home in recent weeks. Pt required verbal cues for follow through w/ tasks. Noted consistent congested cough w/ expectoration of Phlegm at Baseline -- NSG reported same yesterday. NSG reported pt exhibited overt coughing when drinking thin liquids last shift -- NSG has held the clear liquid diet (ordered by MD) this morning.  On [place] O2 support 2L; afebrile currently. WBC elevated 14.8  Pt appears to present w/ grossly functional (immediate-appearing) pharyngeal phase swallowing w/ few trials given of a modified diet consistency(purees, nectar liquids, single ice chips). No immediate, overt clinical s/s of aspiration during po trials given. However, pt continued the congested cough w/ expectoration of Phlegm as noted at baseline PRIOR to po's given. Min oral phase deficits noted c/b lengthy oral phase bolus management. Unsure at this time if any sensorimotor deficits are present.  Pt appears at risk for aspiration in setting of acute illness and ongoing management of secretions. Pt does have challenging factors that could impact her oropharyngeal swallowing to include fatigue/weakness, Pulmonary decline(see CXR this morning indicating increased Edema), and chronic medical issues. Pt also endorsed reduced appetite Baseline for pt. These factors can increase risk for aspiration, dysphagia as well as decreased oral intake overall.   During po trials, pt consumed the few trials/consistencies given w/ No immediate, overt coughing, decline in vocal quality, or change in respiratory presentation  during/post trials. O2 sats remained 94-96%; RR low 20s. Oral phase revealed min increased A-P transfer time, even mashing/gumming of consistencies prior to A-P transfer for swallowing. Oral clearing was achieved w/ all trial consistencies. OM Exam appeared [facility] w/ no unilateral weakness noted. Min increased lingual movement during protrusion task. Speech Clear, low volume. Cough strong, effective. Pt helped to feed self w/ support.  OF NOTE: w/ each trial of po intake, pt stopped to expectorate Phlegm several times w/ dark phlegm noted on towels -- NSG alerted. Trials were not continued d/t the increased Phlegm expectorated.  In setting of concern for risk of aspiration in current medical/Pulmonary presentation, recommend NPO status w/ Pleasure tsps of Applesauce w/ NSG Supervision. Recommend general aspiration precautions, and stop giving if increased coughing or clinical s/s of aspiration noted. Pills CRUSHED in Applesauce for safer, easier swallowing.  Education given on Pills in Puree; general aspiration precautions; NPO status w/ Pleasure Applesauce bites w/ NSG while pt is coughing frequently to clear Phlegm to pt and Husband. Frequent oral care for hygiene and stimulation of swallowing. NSG updated, agreed. ST services will f/u w/ pt's status tomorrow, trials to upgrade diet as appropriate. MD updated. Recommend Dietician f/u for support. SLP Visit Diagnosis: Dysphagia, unspecified (R13.10)  Aspiration Risk  Mild aspiration risk;Risk for inadequate nutrition/hydration    Diet Recommendation   NPO (except pleasure bites of Applesauce w/ NSG supervision); aspiration precautions  Medication Administration: Crushed with puree    Other  Recommendations Recommended Consults:  (Dietician) Oral Care Recommendations: Oral care BID;Oral care before and after PO;Staff/trained caregiver to provide oral care;Oral care QID (support) Caregiver Recommendations:  (TBD)    Recommendations for follow up  therapy are one component of a multi-disciplinary discharge planning process, led by the attending physician.  Recommendations may be updated based on patient status, additional functional criteria and insurance authorization.  Follow up Recommendations Follow physician's recommendations for discharge plan and follow up therapies      Assistance Recommended at Discharge  Full w/ po intake  Functional Status Assessment Patient has had a recent decline in their functional status and demonstrates the ability to make significant improvements in function in a reasonable and predictable amount of time.  Frequency and Duration min 2x/week  2 weeks       Prognosis Prognosis for improved oropharyngeal function: Fair Barriers to Reach Goals: Time post onset;Severity of deficits;Behavior Barriers/Prognosis Comment: baseline issues w/ reduced oral intake; Edentulous status; N/V      Swallow Study   General Date of Onset: 05/19/23 HPI: Pt is a 78 y.o. female w/ past medical history chronic kidney pain, CAD, CHF, DMII, anxiety/depression, Panic attacks, stroke, IBS, and neuropsychiatric disorder.  She presented to [facility] ER on 11/18 via EMS with c/o "not feeling well", nausea, and vomiting.  EMS reported when they arrived at pts home she was lethargic and diaphoretic with generalized weakness.  She received iv fluids en route to the ER.  Pt's husband reports the pt has had a cough over the past 2 weeks, however her cough worsened today and she vomited bile prompting EMS notification.  ED Course   Upon arrival to the ER pt hypotensive and tachycardic meeting sepsis criteria.   Imaging: Worsening aeration, generalized appearance and rapid progression  favoring edema. There is asymmetric left-sided airspace disease and  pneumonia may be coexistent.  2. Background of chronic lung disease and mild fibrosis by prior  chest CT imaging Type of Study: Bedside Swallow Evaluation Previous Swallow Assessment: none Diet  Prior to this Study: Thin liquids (Level 0) (clears per MD order) Temperature Spikes Noted: No (wbc 14.8; has been febrile x1 in last ~24 hours) Respiratory Status: Nasal cannula (2L) History of Recent Intubation: No Behavior/Cognition: Alert;Cooperative;Pleasant mood;Lethargic/Drowsy;Distractible;Requires cueing Oral Cavity Assessment:  (sticky w/ phlegm expectorated) Oral Care Completed by SLP: Yes Oral Cavity - Dentition: Edentulous Vision: Functional for self-feeding Self-Feeding Abilities: Needs assist;Needs set up;Total assist (weak) Patient Positioning: Upright in bed (supported positioning) Baseline Vocal Quality: Normal;Low vocal intensity Volitional Cough: Strong;Congested Volitional Swallow: Able to elicit    Oral/Motor/Sensory Function Overall Oral Motor/Sensory Function: Within functional limits   Ice Chips Presentation: Spoon (fed; 2 trials) Other Comments: coughing w/ phlegm expectoration   Thin Liquid Thin Liquid: Not tested    Nectar Thick Presentation: Spoon (fed; 4 trials) Other Comments: coughing w/ phlegm expectoration   Honey Thick Honey Thick Liquid: Not tested   Puree Presentation: Spoon (fed; 4 trials) Other Comments: coughing w/ phlegm expectoration   Solid     Solid: Not tested        Jerilynn Som, MS, CCC-SLP Speech Language Pathologist Rehab Services; [facility] [place] [phone] (ascom) Shail Urbas 05/20/2023,3:52 PM

## 2023-05-20 NOTE — Progress Notes (Addendum)
PHARMACY CONSULT NOTE  Pharmacy Consult for Electrolyte Monitoring and Replacement   Recent Labs: Potassium (mmol/L)  Date Value  05/20/2023 3.4 (L)  10/24/2014 4.4   Magnesium (mg/dL)  Date Value  62/95/2841 2.7 (H)   Calcium (mg/dL)  Date Value  32/44/0102 7.6 (L)   Calcium, Total (mg/dL)  Date Value  72/53/6644 8.6 (L)   Albumin (g/dL)  Date Value  03/47/4259 2.6 (L)  05/23/2020 3.9  03/02/2012 3.2 (L)   Phosphorus (mg/dL)  Date Value  56/38/7564 1.2 (L)   Sodium (mmol/L)  Date Value  05/20/2023 136  05/23/2020 138  10/24/2014 134 (L)   Assessment: 77 y.o. female HTZ past medical history chronic kidney pain and neuropsychiatric disorder presents to the ER for "not feeling well nausea vomiting starting this morning. Pharmacy is asked to follow and replace electrolytes while in CCU.  Diet: CLD  Goal of Therapy:  Electrolytes WNL  Plan:  --K 3.4, Phos 1.2, ordered potassium phosphate 30 mmol IV x 1 dose --Will re-check labs at 1400  Addendum:  Afternoon lab re-check with resolved hypokalemia and hypophosphatemia. Potassium has trended up out of proportion to replacement administered (3.4 >> 5 after ~ 45 mEq K+ from potassium phosphate). Likely in setting of AKI / shock. Per RN on rounds, patient not voiding. Scr has remained stable. Re-check labs in AM.  Tressie Ellis 05/20/2023 7:41 AM

## 2023-05-20 NOTE — Progress Notes (Signed)
An USGPIV (ultrasound guided PIV) 22G x 1.75" in RFA has been placed for short-term vasopressor infusion. A correctly placed ivWatch must be used when administering Vasopressors. Should this treatment be needed beyond 24 hours, central line access should be obtained.  It will be the responsibility of the bedside nurse to follow best practice to prevent extravasations.

## 2023-05-20 NOTE — Consult Note (Signed)
Cardiology Consult    Patient ID: [name] MRN: [id number], DOB/AGE: [id number]   Admit date: 05/19/2023 Date of Consult: 05/20/2023  Primary Physician: Corky Downs, MD Primary Cardiologist: Colonel Bald, MD Requesting Provider: F. Aleskerov  Patient Profile    Jenny [name] is a 77 y.o. female with a history of nonobs CAD, severe AS s/p bioprosthetic AVR, HFpEF, DMII, HL, TIA, stroke, iron def anemia, anxiety, and hypothyroidism, who is being seen today for the evaluation of elevated troponin and valvular heart dzs in the setting of sepsis at the request of Dr. Karna [name].  Past Medical History  Subjective  Past Medical History:  Diagnosis Date   Anxiety    Arthritis    CAD (coronary artery disease)    a. 01/2017 Cath: nonobs dzs.   Depression    Diabetes mellitus with complication (HCC)    Tylenol   Heart failure with improved ejection fraction (HFimpEF) (HCC)    a. TTE 12/2016, EF 45-50%; b. 05/2023 Echo: EF 55-60%, mild LVH, nl RV fxn, mod dil LA, mod MR/MS, mod TR, sev thickening of AoV w/ mild AS.   Hyperlipidemia    Hypothyroidism    IBS (irritable bowel syndrome)    Iron deficiency anemia    a. s/p pRBC x1 in 12/2016   Microcytic anemia    Osteoporosis    Panic attacks    Severe aortic stenosis    a. TTE 12/2016: possibly bicuspid aortic valve that was moderately thickened with severely calcified leaflets. There was severe aortic stenosis with a mean gradient of 47 mmHg and valve area of 0.52 cm; b. 01/2017 s/p AVR w/ 19 mm bioprosthetic Magna Ease pericardial tissue valve, ser# [id number].   Stroke [facility]) 2001   TIA (transient ischemic attack) 2006    Past Surgical History:  Procedure Laterality Date   ABDOMINAL HYSTERECTOMY     AORTIC VALVE REPLACEMENT N/A 02/17/2017   Procedure: AORTIC VALVE REPLACEMENT (AVR);  Surgeon: Donata Clay, Theron Arista, MD;  Location: [facility] OR;  Service: Open Heart Surgery;  Laterality: N/A;   CATARACT EXTRACTION W/PHACO Right  04/09/2016   Procedure: CATARACT EXTRACTION PHACO AND INTRAOCULAR LENS PLACEMENT (IOC);  Surgeon: Galen Manila, MD;  Location: [facility] ORS;  Service: Ophthalmology;  Laterality: Right;  [place] 57.2 AP% 18.6 CDE 10.64 Fluid Pack lot # [id number]   CATARACT EXTRACTION W/PHACO Left 12/29/2018   Procedure: CATARACT EXTRACTION PHACO AND INTRAOCULAR LENS PLACEMENT (IOC)  LEFT DIABETIC;  Surgeon: Galen Manila, MD;  Location: [facility];  Service: Ophthalmology;  Laterality: Left;  Diabetic - insuli and oral meds   CHOLECYSTECTOMY     COLONOSCOPY     CORONARY ANGIOGRAPHY N/A 02/03/2017   Procedure: CORONARY ANGIOGRAPHY;  Surgeon: Iran Ouch, MD;  Location: [facility] INVASIVE CV LAB;  Service: Cardiovascular;  Laterality: N/A;   FRACTURE SURGERY     LEFT ANKLE   RIGHT AND LEFT HEART CATH Bilateral 02/03/2017   Procedure: Right and Left Heart Cath;  Surgeon: Iran Ouch, MD;  Location: [facility] INVASIVE CV LAB;  Service: Cardiovascular;  Laterality: Bilateral;   TEE WITHOUT CARDIOVERSION N/A 02/17/2017   Procedure: TRANSESOPHAGEAL ECHOCARDIOGRAM (TEE);  Surgeon: Donata Clay, Theron Arista, MD;  Location: [facility] OR;  Service: Open Heart Surgery;  Laterality: N/A;   TIBIA IM NAIL INSERTION Left 05/19/2018   Procedure: INTRAMEDULLARY (IM) NAIL TIBIAL;  Surgeon: Juanell Fairly, MD;  Location: [facility] ORS;  Service: Orthopedics;  Laterality: Left;     Allergies  Allergies  Allergen Reactions  Iodine Swelling    ANGIOEDEMA FACIAL SWELLING "BETADINE OKAY"   Shellfish Allergy Anaphylaxis      History of Present Illness     77 y/o ? w a h/o nonobs CAD, severe AS s/p bioprosthetic AVR, HFpEF, DMII, HL, TIA, stroke, iron def anemia, anxiety, and hypothyroidism.  She was diagnosed with severe aortic stenosis during hospitalization July 2018, when she was admitted with heart failure and an EF of 45 to 50% with severe AS.  Catheterization revealed nonobstructive disease.  She subsequently was seen by thoracic  surgery and underwent bioprosthetic aortic valve replacement.  She has since been followed by her primary care provider, Dr. Juel [name].   Patient is currently disoriented to time and place and therefore, obtaining history is difficult.  At 1 point, she says that she has been feeling poorly for several months but then notes that it is just over the past few weeks that she has had dyspnea with cough.  Notes indicated that she presented to the ED via EMS on 11/18 w/ complaints of  wkns, lethargy, diaphoresis, n, and v.  Husband reported 2 wk h/o cough that worsened on 11/18.  On arrival, she was afebrile and tachycardic (ECG: Sinus tachycardia, 122, right bundle branch block). Hypotension was noted w/ pressures in the 70's to low 100's, and she has required norepi.  Labs notable for: K 3.1, Mg 1.1, Creat 1.09 (0.77 earlier this year), AST 168, ALT 56, lactic acid up to 6.5. Troponins were initially mildly elevated but have since risen further (55  70  120  126  271  718  848).  Chest x-ray concerning for pneumonia.  She has received IV abx (ceftriaxone and azithromycin) along w/ aggressive K and Mg supplementation.  Creat higher this AM @ 1.27.  LFTs abnormalities have also worsened w/ AST/ALT of 874/256 this AM.  Echo w/ EF 55-60%, nl RV fxn, mod dil LA, mild MR/TR.  As noted above, she is currently mildly agitated and has asked [place] to leave her room.  She is not in any distress at this time.  Inpatient Medications  Subjective    ALPRAZolam  0.5 mg Oral BID   Chlorhexidine Gluconate Cloth  6 each Topical Daily   donepezil  10 mg Oral QHS   heparin injection (subcutaneous)  5,000 Units Subcutaneous Q12H   hydrOXYzine  25 mg Oral BID   insulin aspart  0-5 Units Subcutaneous QHS   insulin aspart  0-9 Units Subcutaneous TID WC   [START ON 05/21/2023] levothyroxine  88 mcg Oral Daily   sertraline  100 mg Oral QHS   thiamine (VITAMIN B1) injection  100 mg Intravenous Daily    Family History    Family History   Problem Relation Age of Onset   Hypertension Mother    Parkinson's disease Mother    Alzheimer's disease Mother    Diabetes Father    Diabetes Brother    Cancer Maternal Grandmother        unknown   Cancer Maternal Grandfather        unknown   Neuropathy Neg Hx    She indicated that her mother is deceased. She indicated that her father is deceased. She indicated that her brother is alive. She indicated that her maternal grandmother is deceased. She indicated that her maternal grandfather is deceased. She indicated that the status of her neg hx is unknown.   Social History    Social History   Socioeconomic History   Marital status: Married  Spouse name: Not on file   Number of children: 3   Years of education: 14   Highest education level: 12th grade  Occupational History   Occupation: Retired  Tobacco Use   Smoking status: Former    Current packs/day: 0.00    Types: Cigarettes    Start date: 02/03/1970    Quit date: 02/04/2000    Years since quitting: 23.3   Smokeless tobacco: Never  Vaping Use   Vaping status: Never Used  Substance and Sexual Activity   Alcohol use: No   Drug use: No   Sexual activity: Not Currently  Other Topics Concern   Not on file  Social History Narrative   Not on file   Social Determinants of Health   Financial Resource Strain: Low Risk  (05/18/2021)   Overall Financial Resource Strain (CARDIA)    Difficulty of Paying Living Expenses: Not hard at all  Food Insecurity: No Food Insecurity (05/20/2023)   Hunger Vital Sign    Worried About Running Out of Food in the Last Year: Never true    Ran Out of Food in the Last Year: Never true  Transportation Needs: No Transportation Needs (05/20/2023)   PRAPARE - Administrator, Civil Service (Medical): No    Lack of Transportation (Non-Medical): No  Physical Activity: Insufficiently Active (05/18/2021)   Exercise Vital Sign    Days of Exercise per Week: 2 days    Minutes of  Exercise per Session: 20 min  Stress: No Stress Concern Present (05/18/2021)   [employer] of Occupational Health - Occupational Stress Questionnaire    Feeling of Stress : Only a little  Social Connections: Socially Integrated (05/18/2021)   Social Connection and Isolation Panel [NHANES]    Frequency of Communication with Friends and Family: More than three times a week    Frequency of Social Gatherings with Friends and Family: More than three times a week    Attends Religious Services: 1 to 4 times per year    Active Member of [employer] or Organizations: Yes    Attends Banker Meetings: 1 to 4 times per year    Marital Status: Married  Catering manager Violence: Not At Risk (05/20/2023)   Humiliation, Afraid, Rape, and Kick questionnaire    Fear of Current or Ex-Partner: No    Emotionally Abused: No    Physically Abused: No    Sexually Abused: No     Review of Systems    General:  +++ malaise and wkns.  No chills, fever, night sweats or weight changes.  Cardiovascular:  No chest pain, +++ dyspnea on exertion, no edema, orthopnea, palpitations, paroxysmal nocturnal dyspnea. Dermatological: No rash, lesions/masses Respiratory: +++ cough, +++ dyspnea Urologic: No hematuria, dysuria Abdominal:   +++ nausea and vomiting, no diarrhea, bright red blood per rectum, melena, or hematemesis Neurologic:  No visual changes, wkns, changes in mental status. All other systems reviewed and are otherwise negative except as noted above.    Objective  Physical Exam    Blood pressure (!) 115/52, pulse (!) 102, temperature 99.9 F (37.7 C), temperature source Oral, resp. rate (!) 25, weight 45.5 kg, SpO2 100%.  See physician documentation regarding exam  Labs    Cardiac Enzymes Recent Labs  Lab 05/19/23 2139 05/19/23 2303 05/20/23 0450 05/20/23 1042 05/20/23 1309  TROPONINIHS 120* 126* 271* 718* 848*     BNP    Component Value Date/Time   BNP 881.6 (H) 05/19/2023 1558    Lab  Results  Component Value Date   WBC 14.8 (H) 05/20/2023   HGB 10.0 (L) 05/20/2023   HCT 30.5 (L) 05/20/2023   MCV 89.7 05/20/2023   PLT 85 (L) 05/20/2023    Recent Labs  Lab 05/20/23 0450 05/20/23 1309  NA  --  133*  K  --  5.0  CL  --  106  CO2  --  20*  BUN  --  27*  CREATININE  --  1.26*  CALCIUM  --  7.4*  PROT 5.4*  --   BILITOT 0.9  --   ALKPHOS 49  --   ALT 256*  --   AST 874*  --   GLUCOSE  --  88   Lab Results  Component Value Date   CHOL 92 01/15/2017   HDL 36 (L) 01/15/2017   LDLCALC 37 01/15/2017   TRIG 97 01/15/2017   Lab Results  Component Value Date   INR 1.5 (H) 05/19/2023   INR 1.14 05/19/2018   INR 1.98 02/17/2017      Radiology Studies    US RENAL  Result Date: 05/20/2023 CLINICAL DATA:  Acute renal failure. EXAM: RENAL / URINARY TRACT ULTRASOUND COMPLETE COMPARISON:  CT abdomen pelvis dated December 25, 2021. FINDINGS: Right Kidney: Renal measurements: 10.4 x 4.1 x 4.9 cm = volume: 110 mL. Increased echogenicity. No mass or hydronephrosis visualized. Left Kidney: Renal measurements: 10.4 x 4.3 x 5.3 cm = volume: 123 mL. Increased echogenicity. No mass or hydronephrosis visualized. Bladder: Appears normal for degree of bladder distention. Other: Small ascites in the right upper quadrant. IMPRESSION: 1. Increased bilateral renal echogenicity as can be seen with medical renal disease. No hydronephrosis. 2. Small ascites in the right upper quadrant. Electronically Signed   By: Obie Dredge M.D.   On: 05/20/2023 15:28   DG Chest Port 1 View  Result Date: 05/20/2023 CLINICAL DATA:  Pneumonia EXAM: PORTABLE CHEST 1 VIEW COMPARISON:  Yesterday FINDINGS: Extensive interstitial and airspace density. Cardiomegaly. Aortic valve replacement. A small left pleural effusion is likely. Clear apical lungs. IMPRESSION: 1. Worsening aeration, generalized appearance and rapid progression favoring edema. There is asymmetric left-sided airspace disease and  pneumonia may be coexistent. 2. Background of chronic lung disease and mild fibrosis by prior chest CT imaging Electronically Signed   By: Tiburcio Pea M.D.   On: 05/20/2023 05:11   DG Abd 1 View  Result Date: 05/19/2023 CLINICAL DATA:  Vomiting EXAM: ABDOMEN - 1 VIEW COMPARISON:  12/25/2021 CT FINDINGS: Prior cholecystectomy. Nonobstructive bowel gas pattern. No organomegaly or free air. Diffuse aortic calcifications. No visible aneurysm. IMPRESSION: No acute findings. Electronically Signed   By: Charlett Nose M.D.   On: 05/19/2023 21:25   DG Chest Port 1 View  Result Date: 05/19/2023 CLINICAL DATA:  Questionable sepsis. EXAM: PORTABLE CHEST 1 VIEW COMPARISON:  Chest radiograph dated 12/25/2021 and CT dated 02/18/2022. FINDINGS: There is background of chronic interstitial coarsening. An area of increased opacity in the left mid lung field most concerning for developing infiltrate. Additional smaller density in the right lower lung field also concerning for infiltrate. No pleural effusion or pneumothorax. The cardiac silhouette is within normal limits. Median sternotomy wires and mechanical cardiac valve. Atherosclerotic calcification of the aorta. No acute osseous pathology. IMPRESSION: Bilateral pulmonary infiltrates, left greater than right. Electronically Signed   By: Elgie Collard M.D.   On: 05/19/2023 18:42      ECG & Cardiac Imaging    Sinus tachycardia, 122, right bundle branch  block- personally reviewed.  Assessment & Plan    1.  Pneumonia/sepsis: Admitted with several week history of dyspnea and cough.  Found to be hypotensive with lactic acidosis.  Chest x-ray suggestive of pneumonia.  Antibiotics and vasopressor support per critical care medicine team.  2.  Non-STEMI: In the setting of above, troponin elevated and currently at 848.  Unclear regarding history of chest pain as patient is not able to provide details at this time.  She does have a history of aortic stenosis with  bioprosthetic valve replacement in 2018.  Diagnostic catheterization at that time showed nonobstructive CAD.  Echo earlier today showed normal LV function with moderate MR, MS, TR, and mild AS with aortic valve thickening.  Ideally would heparinize x 48 hours however, in the setting of thrombocytopenia, will hold off on intravenous heparin as well as aspirin for the time being.  No beta-blocker in the setting of hypotension requiring vasopressor therapy.  No statin in the setting of transaminitis.  Will consider ischemic evaluation pending recovery.  3.  Severe aortic stenosis status post bioprosthetic aortic valve replacement/valvular heart disease: As above, echo shows normal LV function with moderate MR, MS, TR, and mild AS.  May require TEE for further elucidation of mitral valve disease.  4.  Type 2 diabetes mellitus: Per critical care team.  5.  Transaminitis: In the setting of sepsis.  INR 1.5.  Follow.  Risk Assessment/Risk Scores:     TIMI Risk Score for Unstable Angina or Non-ST Elevation MI:   The patient's TIMI risk score is 3, which indicates a 13% risk of all cause mortality, new or recurrent myocardial infarction or need for urgent revascularization in the next 14 days.      Signed, Nicolasa Ducking, NP 05/20/2023, 5:47 PM  For questions or updates, please contact   Please consult [email or web address] for contact info under Cardiology/STEMI.

## 2023-05-21 ENCOUNTER — Inpatient Hospital Stay: Payer: Medicare Other

## 2023-05-21 DIAGNOSIS — I214 Non-ST elevation (NSTEMI) myocardial infarction: Secondary | ICD-10-CM | POA: Diagnosis not present

## 2023-05-21 DIAGNOSIS — Z952 Presence of prosthetic heart valve: Secondary | ICD-10-CM

## 2023-05-21 DIAGNOSIS — R188 Other ascites: Secondary | ICD-10-CM

## 2023-05-21 DIAGNOSIS — I350 Nonrheumatic aortic (valve) stenosis: Secondary | ICD-10-CM | POA: Diagnosis not present

## 2023-05-21 DIAGNOSIS — A419 Sepsis, unspecified organism: Secondary | ICD-10-CM | POA: Diagnosis not present

## 2023-05-21 DIAGNOSIS — I050 Rheumatic mitral stenosis: Secondary | ICD-10-CM

## 2023-05-21 DIAGNOSIS — K7460 Unspecified cirrhosis of liver: Secondary | ICD-10-CM

## 2023-05-21 LAB — GLUCOSE, CAPILLARY
Glucose-Capillary: 72 mg/dL (ref 70–99)
Glucose-Capillary: 73 mg/dL (ref 70–99)
Glucose-Capillary: 67 mg/dL — ABNORMAL LOW (ref 70–99)
Glucose-Capillary: 79 mg/dL (ref 70–99)
Glucose-Capillary: 97 mg/dL (ref 70–99)

## 2023-05-21 LAB — CBC WITH DIFFERENTIAL/PLATELET
Abs Immature Granulocytes: 0.42 10*3/uL — ABNORMAL HIGH (ref 0.00–0.07)
Basophils Absolute: 0 10*3/uL (ref 0.0–0.1)
Basophils Relative: 0 %
Eosinophils Absolute: 0 10*3/uL (ref 0.0–0.5)
Eosinophils Relative: 0 %
HCT: 28.5 % — ABNORMAL LOW (ref 36.0–46.0)
Hemoglobin: 9.6 g/dL — ABNORMAL LOW (ref 12.0–15.0)
Immature Granulocytes: 3 %
Lymphocytes Relative: 6 %
Lymphs Abs: 0.7 10*3/uL (ref 0.7–4.0)
MCH: 29.9 pg (ref 26.0–34.0)
MCHC: 33.7 g/dL (ref 30.0–36.0)
MCV: 88.8 fL (ref 80.0–100.0)
Monocytes Absolute: 0.6 10*3/uL (ref 0.1–1.0)
Monocytes Relative: 5 %
Neutro Abs: 10.6 10*3/uL — ABNORMAL HIGH (ref 1.7–7.7)
Neutrophils Relative %: 86 %
Platelets: 70 10*3/uL — ABNORMAL LOW (ref 150–400)
RBC: 3.21 MIL/uL — ABNORMAL LOW (ref 3.87–5.11)
RDW: 16.2 % — ABNORMAL HIGH (ref 11.5–15.5)
Smear Review: NORMAL
WBC: 12.3 10*3/uL — ABNORMAL HIGH (ref 4.0–10.5)
nRBC: 0 % (ref 0.0–0.2)

## 2023-05-21 LAB — PROCALCITONIN: Procalcitonin: 81.93 ng/mL

## 2023-05-21 LAB — BASIC METABOLIC PANEL
Anion gap: 5 (ref 5–15)
BUN: 32 mg/dL — ABNORMAL HIGH (ref 8–23)
CO2: 21 mmol/L — ABNORMAL LOW (ref 22–32)
Calcium: 7.4 mg/dL — ABNORMAL LOW (ref 8.9–10.3)
Chloride: 110 mmol/L (ref 98–111)
Creatinine, Ser: 1.12 mg/dL — ABNORMAL HIGH (ref 0.44–1.00)
GFR, Estimated: 52 mL/min — ABNORMAL LOW (ref >60)
Glucose, Bld: 95 mg/dL (ref 70–99)
Potassium: 4.6 mmol/L (ref 3.5–5.1)
Sodium: 136 mmol/L (ref 135–145)

## 2023-05-21 LAB — MAGNESIUM: Magnesium: 2.6 mg/dL — ABNORMAL HIGH (ref 1.7–2.4)

## 2023-05-21 LAB — LEGIONELLA PNEUMOPHILA SEROGP 1 UR AG: L. pneumophila Serogp 1 Ur Ag: NEGATIVE

## 2023-05-21 LAB — URINE CULTURE: Culture: <10000 — AB

## 2023-05-21 LAB — PHOSPHORUS: Phosphorus: 3 mg/dL (ref 2.5–4.6)

## 2023-05-21 LAB — TROPONIN I (HIGH SENSITIVITY): Troponin I (High Sensitivity): 767 ng/L (ref <18)

## 2023-05-21 MED ORDER — ADULT MULTIVITAMIN W/MINERALS CH
1.0000 | ORAL_TABLET | Freq: Every day | ORAL | Status: DC
  Administered 2023-05-23 – 2023-05-29 (×5): 1 via ORAL
  Filled 2023-05-21 (×5): qty 1

## 2023-05-21 MED ORDER — BOOST PLUS PO LIQD
237.0000 mL | Freq: Three times a day (TID) | ORAL | Status: DC
  Administered 2023-05-22: 237 mL via ORAL
  Filled 2023-05-21: qty 237

## 2023-05-21 MED ORDER — THIAMINE MONONITRATE 100 MG PO TABS
100.0000 mg | ORAL_TABLET | Freq: Every day | ORAL | Status: DC
  Administered 2023-05-21 – 2023-05-31 (×8): 100 mg via ORAL
  Filled 2023-05-21 (×8): qty 1

## 2023-05-21 MED ORDER — LOPERAMIDE HCL 2 MG PO CAPS
2.0000 mg | ORAL_CAPSULE | Freq: Four times a day (QID) | ORAL | Status: DC | PRN
  Administered 2023-05-21 – 2023-05-22 (×2): 2 mg via ORAL
  Filled 2023-05-21 (×2): qty 1

## 2023-05-21 MED ORDER — AZITHROMYCIN 500 MG PO TABS
500.0000 mg | ORAL_TABLET | Freq: Every day | ORAL | Status: AC
  Administered 2023-05-21 – 2023-05-23 (×3): 500 mg via ORAL
  Filled 2023-05-21 (×2): qty 1
  Filled 2023-05-21: qty 2

## 2023-05-21 MED ORDER — ALPRAZOLAM 0.5 MG PO TABS
0.5000 mg | ORAL_TABLET | Freq: Two times a day (BID) | ORAL | Status: DC | PRN
  Administered 2023-05-21 – 2023-05-27 (×7): 0.5 mg via ORAL
  Filled 2023-05-21 (×8): qty 1

## 2023-05-21 NOTE — Progress Notes (Signed)
Speech Language Pathology Treatment: Dysphagia  Patient Details Name: Jenny [name] MRN: [id number] DOB: Apr 17, 1946 Today's Date: 05/21/2023 Time: 7829-5621 SLP Time Calculation (min) (ACUTE ONLY): 40 min  Assessment / Plan / Recommendation Clinical Impression  Pt seen for ongoing toleration of po's and initiation of oral diet today. Pt awakened w/ cues, verbally responded. Noted to be drowsy and often closed eyes and dozed off when not given stimulation -- Husband has stated the same at home in recent weeks. This was relayed to Pharmacy and Team during Rounds this morning as pt is on medications that may make her drowsy per report.  Pt required min verbal/visual cues for follow through w/ tasks. Noted the congested cough w/ expectoration of Phlegm from yesterday was NOT present today.   On [place] O2 support 2L; afebrile currently. WBC trending down today.  Pt appears to present w/ grossly functional oropharyngeal phase swallowing w/ a modified diet/food consistency w/ No overt neuromuscular deficits/dysphagia noted. Pt consumed the po trials w/ No overt clinical s/s of aspiration during po trials. Pt is Edentulous which impacted mastication of foods. Pt appears at reduced risk for aspiration/aspiration pneumonia when following general aspiration precautions w/ a modified diet consistency for ease of mashing/gumming foods for swallowing.  Pt continues to require feeding support and encouragement for sitting AND taking po's.   During po trials, pt consumed all consistencies w/ No overt coughing, decline in vocal quality, or change in respiratory presentation during/post trials. O2 sats remained 97-98%. Oral phase appeared [facility] w/ timely bolus management and control of bolus propulsion for A-P transfer for swallowing of liquids and purees. Increased oral phase time needed for mashing/gumming Minced foods, moistened well. Oral clearing achieved w/ all trial consistencies given Time and moistening the  foods.  Pt helped to feed self by holding Cup for drinking -- this decreases risk for aspiration when drinking.   Recommend a MINCED consistency diet w/ well-moistened foods; Thin liquids -- carefully monitor straw use, and pt should Hold Cup when drinking. Recommend general aspiration precautions, reduce distractions during meals, talking. Must sit fully upright for any po's. Support feeding as needed d/t overall weakness and illness. Pills CRUSHED vs WHOLE in Puree for safer, easier swallowing.  Education given on Pills in Puree; food consistencies and easy to eat options; general aspiration precautions to pt and NSG. ST services will f/u next 1-2 days for toleration of diet and any further education needed by pt/family. NSG updated, agreed. MD updated. Recommend Dietician f/u for support.     HPI HPI: Pt is a 77 y.o. female w/ past medical history chronic kidney pain, CAD, CHF, DMII, anxiety/depression, Panic attacks, stroke, IBS, and neuropsychiatric disorder.  She presented to [facility] ER on 11/18 via EMS with c/o "not feeling well", nausea, and vomiting.  EMS reported when they arrived at pts home she was lethargic and diaphoretic with generalized weakness.  She received iv fluids en route to the ER.  Pt's husband reports the pt has had a cough over the past 2 weeks, however her cough worsened today and she vomited bile prompting EMS notification.  ED Course   Upon arrival to the ER pt hypotensive and tachycardic meeting sepsis criteria.   Imaging: Worsening aeration, generalized appearance and rapid progression  favoring edema. There is asymmetric left-sided airspace disease and  pneumonia may be coexistent.  2. Background of chronic lung disease and mild fibrosis by prior  chest CT imaging      SLP Plan  Continue with  current plan of care      Recommendations for follow up therapy are one component of a multi-disciplinary discharge planning process, led by the attending physician.   Recommendations may be updated based on patient status, additional functional criteria and insurance authorization.    Recommendations  Diet recommendations: Dysphagia 2 (fine chop);Thin liquid (added purees) Liquids provided via: Cup;Straw (monitor) Medication Administration: Crushed with puree Supervision: Patient able to self feed;Staff to assist with self feeding;Full supervision/cueing for compensatory strategies Compensations: Minimize environmental distractions;Slow rate;Small sips/bites;Lingual sweep for clearance of pocketing;Follow solids with liquid Postural Changes and/or Swallow Maneuvers: Out of bed for meals;Seated upright 90 degrees;Upright 30-60 min after meal (REFLUX Precautions)                 (Dietician f/u; GI f/u if Esophageal phase Dysmotility is suspected) Oral care BID;Oral care before and after PO;Staff/trained caregiver to provide oral care;Oral care QID (support)   Frequent or constant Supervision/Assistance Dysphagia, unspecified (R13.10) (Edentulous and weak; Esophageal phase dysmotility suspected(belching+); reduced oral intake at Baseline per history/Husband's report)     Continue with current plan of care       Jerilynn Som, MS, CCC-SLP Speech Language Pathologist Rehab Services; [facility] [place] [phone] (ascom) Jenny [name]  05/21/2023, 3:48 PM

## 2023-05-21 NOTE — Progress Notes (Signed)
Initial Nutrition Assessment  DOCUMENTATION CODES:   Severe malnutrition in context of chronic illness  INTERVENTION:   Boost Plus chocolate- Each supplement provides 360kcal and 14g protein.  Magic cup TID with meals, each supplement provides 290 kcal and 9 grams of protein   MVI po daily   Pt at high refeed risk; recommend monitor potassium, magnesium and phosphorus labs daily until stable  Daily weights   NUTRITION DIAGNOSIS:   Severe Malnutrition related to chronic illness as evidenced by severe fat depletion, severe muscle depletion.  GOAL:   Patient will meet greater than or equal to 90% of their needs  MONITOR:   PO intake, Supplement acceptance, Labs, Weight trends, Skin, I & O's  REASON FOR ASSESSMENT:   Consult Assessment of nutrition requirement/status  ASSESSMENT:   77 y/o female with h/o severe aortic stenosis, CHF, DM, TIA/stroke, mood disorder, IBS, hypothyroidism, CAD, HLD, HTN, RA and anxiety who is admitted with PNA, sepsis, shock and AKI.  Visited pt's room today. Pt is a poor historian. Per chart review, it appears that patient has had some cognitive decline over the past few years. Pt with nausea and vomiting the day of admission. Per husband report, pt with poor appetite and oral intake at baseline; pt only eats bites of meals and husband reports that this has been going on for many years. Pt has remained NPO since admission r/t AMS. Pt seen by SLP today and was placed on a diet. Pt did not like Ensure when offered it by SLP today. RD will add supplements and MVI to help pt meet her estimated needs. Pt is at high refeed risk. Per chart, pt appears to be down 19lbs(16%) over the past 9 months; this is significant weight loss.   Medications reviewed and include: aspirin, azithromycin, heparin, insulin, synthroid, thiamine, ceftriaxone   Labs reviewed: K 4.6 wnl, BUN 32(H), creat 1.12(H), P 3.0 wnl, Mg 2.6(H) Wbc- 12.3(H), Hgb 9.6(L), Hct 28.5(L) Cbgs-  73, 72 x 24 hrs  AIC 5.3- 11/18  NUTRITION - FOCUSED PHYSICAL EXAM:  Flowsheet Row Most Recent Value  Orbital Region Severe depletion  Upper Arm Region Moderate depletion  Thoracic and Lumbar Region Severe depletion  Buccal Region Moderate depletion  Temple Region Severe depletion  Clavicle Bone Region Severe depletion  Clavicle and Acromion Bone Region Severe depletion  Scapular Bone Region Severe depletion  Dorsal Hand Severe depletion  Patellar Region Severe depletion  Anterior Thigh Region Severe depletion  Posterior Calf Region Severe depletion  Edema (RD Assessment) None  Hair Reviewed  Eyes Reviewed  Mouth Reviewed  Skin Reviewed  Nails Reviewed   Diet Order:   Diet Order             DIET DYS 2 Room service appropriate? Yes with Assist; Fluid consistency: Thin  Diet effective now                  EDUCATION NEEDS:   No education needs have been identified at this time  Skin:  Skin Assessment: Reviewed RN Assessment  Last BM:  11/19- TYPE 7  Height:   Ht Readings from Last 1 Encounters:  05/21/23 4\' 8"  (1.422 m)    Weight:   Wt Readings from Last 1 Encounters:  05/21/23 45 kg    Ideal Body Weight:  42.2 kg  BMI:  Body mass index is 22.24 kg/m.  Estimated Nutritional Needs:   Kcal:  1200-1400kcal/day  Protein:  60-70g/day  Fluid:  1.2-1.4L/day  Betsey Holiday MS,  RD, LDN Please refer to [facility] for RD and/or RD on-call/weekend/after hours pager

## 2023-05-21 NOTE — Evaluation (Signed)
Physical Therapy Evaluation Patient Details Name: Jenny [name] MRN: [id number] DOB: Mar 22, 1946 Today's Date: 05/21/2023  History of Present Illness  77 y.o. female presented to [facility] ER on 11/18 via EMS with c/o "not feeling well", nausea, and vomiting. MD assessment: elevated troponin and valvular heart dzs in the setting of sepsis and AKI.  PMHx: CAD, severe AS s/p bioprosthetic AVR, HFpEF, DMII, HL, TIA, stroke, iron def anemia, COPD, anxiety, and hypothyroidism.   Clinical Impression  Patient received in bed, she is very lethargic appearing. Husband at bedside to assist with home set up and prior functional level. Patient requires cues to open eyes. She required max assist +1-2 to sit edge of bed. Patient demonstrating poor sitting balance initially but with set up assist and cues she is able to sit edge of bed with cga. Patient declined attempting to stand due to weakness. She will continue to benefit from skilled PT to improve strength safety and independence.           If plan is discharge home, recommend the following: Two people to help with walking and/or transfers;A lot of help with bathing/dressing/bathroom   Can travel by private vehicle   No    Equipment Recommendations None recommended by PT;Other (comment) (TBD)  Recommendations for Other Services       Functional Status Assessment Patient has had a recent decline in their functional status and demonstrates the ability to make significant improvements in function in a reasonable and predictable amount of time.     Precautions / Restrictions Precautions Precautions: Fall Restrictions Weight Bearing Restrictions: No      Mobility  Bed Mobility Overal bed mobility: Needs Assistance Bed Mobility: Supine to Sit, Sit to Supine     Supine to sit: Used rails, Max assist, HOB elevated Sit to supine: Max assist, Used rails   General bed mobility comments: Max A for bed mobility tasks with cueing for hand placement  on bed rails    Transfers                   General transfer comment: NT as pt declined and is significantly weak and tired during eval    Ambulation/Gait               General Gait Details: unable due to lethargy  Stairs            Wheelchair Mobility     Tilt Bed    Modified Rankin (Stroke Patients Only)       Balance Overall balance assessment: Needs assistance, History of Falls Sitting-balance support: Feet unsupported, No upper extremity supported Sitting balance-Leahy Scale: Poor Sitting balance - Comments: pt initially needing Mod A to maintain seated balance d/t L lateral lean, but able to self correct and sit with CGA with time Postural control: Left lateral lean                                   Pertinent Vitals/Pain Pain Assessment Pain Assessment: Faces Faces Pain Scale: Hurts a little bit Pain Location: R LE to touch, moving in bed Pain Descriptors / Indicators: Discomfort, Sore, Grimacing Pain Intervention(s): Monitored during session, Repositioned    Home Living Family/patient expects to be discharged to:: Private residence Living Arrangements: Spouse/significant other Available Help at Discharge: Family;Available 24 hours/day Type of Home: House Home Access: Stairs to enter Entrance Stairs-Rails: Right;Left;Can reach both Entrance Stairs-Number of Steps: 4  Home Layout: Two level;Able to live on main level with bedroom/bathroom Home Equipment: Rollator (4 wheels);Shower seat      Prior Function Prior Level of Function : Independent/Modified Independent             Mobility Comments: furniture surfs in the home, uses rollator outside of the home; spouse assists with getting up/down steps outside of home; spouse reports ~1 fall a week "falls backwards" ADLs Comments: IND, sponge bathes as baseline for the last year d/t a fall in the shower; able to perform laundry, light meal prep, etc.      Extremity/Trunk Assessment   Upper Extremity Assessment Upper Extremity Assessment: Generalized weakness    Lower Extremity Assessment Lower Extremity Assessment: Generalized weakness    Cervical / Trunk Assessment Cervical / Trunk Assessment: Normal  Communication   Communication Communication: Difficulty following commands/understanding;Difficulty communicating thoughts/reduced clarity of speech Following commands: Follows one step commands with increased time Cueing Techniques: Verbal cues;Tactile cues  Cognition Arousal: Lethargic Behavior During Therapy: Flat affect Overall Cognitive Status: Within Functional Limits for tasks assessed                                 General Comments: awakens/opens eyes when asked, but otherwise lethargic; oriented x3        General Comments General comments (skin integrity, edema, etc.): pt had BM at end of session, nursing notified    Exercises     Assessment/Plan    PT Assessment Patient needs continued PT services  PT Problem List Decreased strength;Decreased activity tolerance;Decreased balance;Decreased mobility       PT Treatment Interventions Functional mobility training;Therapeutic activities;Therapeutic exercise;Balance training;Gait training;DME instruction;Patient/family education;Cognitive remediation;Stair training    PT Goals (Current goals can be found in the Care Plan section)  Acute Rehab PT Goals Patient Stated Goal: to improve PT Goal Formulation: With family Time For Goal Achievement: 06/04/23 Potential to Achieve Goals: Fair    Frequency Min 1X/week     Co-evaluation PT/OT/SLP Co-Evaluation/Treatment: Yes Reason for Co-Treatment: Necessary to address cognition/behavior during functional activity;For patient/therapist safety;To address functional/ADL transfers PT goals addressed during session: Mobility/safety with mobility;Balance         AM-PAC PT "6 Clicks" Mobility  Outcome  Measure Help needed turning from your back to your side while in a flat bed without using bedrails?: Total Help needed moving from lying on your back to sitting on the side of a flat bed without using bedrails?: Total Help needed moving to and from a bed to a chair (including a wheelchair)?: Total Help needed standing up from a chair using your arms (e.g., wheelchair or bedside chair)?: Total Help needed to walk in hospital room?: Total Help needed climbing 3-5 steps with a railing? : Total 6 Click Score: 6    End of Session Equipment Utilized During Treatment: Oxygen Activity Tolerance: Patient limited by fatigue Patient left: in bed;with family/visitor present;with call bell/phone within reach Nurse Communication: Mobility status;Other (comment) (patient had BM in bed at end of session) PT Visit Diagnosis: Muscle weakness (generalized) (M62.81);Other abnormalities of gait and mobility (R26.89);Repeated falls (R29.6)    Time: 5400-8676 PT Time Calculation (min) (ACUTE ONLY): 17 min   Charges:   PT Evaluation $PT Eval Low Complexity: 1 Low   PT General Charges $$ ACUTE PT VISIT: 1 Visit         Veronnica Hennings, PT, GCS 05/21/23,3:12 PM

## 2023-05-21 NOTE — Plan of Care (Signed)
  Problem: Metabolic: Goal: Ability to maintain appropriate glucose levels will improve Outcome: Progressing   Problem: Skin Integrity: Goal: Risk for impaired skin integrity will decrease Outcome: Progressing   Problem: Tissue Perfusion: Goal: Adequacy of tissue perfusion will improve Outcome: Progressing   Problem: Clinical Measurements: Goal: Ability to maintain clinical measurements within normal limits will improve Outcome: Progressing   Problem: Coping: Goal: Ability to adjust to condition or change in health will improve Outcome: Not Progressing   Problem: Health Behavior/Discharge Planning: Goal: Ability to identify and utilize available resources and services will improve Outcome: Not Progressing Goal: Ability to manage health-related needs will improve Outcome: Not Progressing   Problem: Education: Goal: Knowledge of General Education information will improve Description: Including pain rating scale, medication(s)/side effects and non-pharmacologic comfort measures Outcome: Not Progressing

## 2023-05-21 NOTE — Progress Notes (Signed)
PHARMACY CONSULT NOTE  Pharmacy Consult for Electrolyte Monitoring and Replacement   Recent Labs: Potassium (mmol/L)  Date Value  05/21/2023 4.6  10/24/2014 4.4   Magnesium (mg/dL)  Date Value  47/82/9562 2.6 (H)   Calcium (mg/dL)  Date Value  13/01/6577 7.4 (L)   Calcium, Total (mg/dL)  Date Value  46/96/2952 8.6 (L)   Albumin (g/dL)  Date Value  84/13/2440 2.4 (L)  05/23/2020 3.9  03/02/2012 3.2 (L)   Phosphorus (mg/dL)  Date Value  04/27/2535 3.0   Sodium (mmol/L)  Date Value  05/21/2023 136  05/23/2020 138  10/24/2014 134 (L)   Assessment: 77 y.o. female HTZ past medical history chronic kidney pain and neuropsychiatric disorder presents to the ER for "not feeling well nausea vomiting starting this morning. Pharmacy is asked to follow and replace electrolytes while in CCU.  Diet: NPO, aspiration precautions. Pills crushed in applesauce  Goal of Therapy:  Electrolytes WNL  Plan:  --No electrolyte replacement indicated at this time --Follow-up electrolytes with AM labs tomorrow  Tressie Ellis 05/21/2023 7:57 AM

## 2023-05-21 NOTE — Progress Notes (Signed)
Pt's husband updated at bedside on plan of care.  All questions answered, he is very appreciative of update.    Harlon Ditty, AGACNP-BC [facility] Prefer epic messenger for cross cover needs If after hours, please call E-link

## 2023-05-21 NOTE — Progress Notes (Signed)
Rounding Note    Patient Name: Jenny [name] Date of Encounter: 05/21/2023  [facility] Cardiologist: Lorine Bears, MD  Subjective   Lethargic but arousable this morning Completed right upper quadrant ultrasound during rounds Prelim detailing cirrhosis Prior cholecystectomy  Reports that her body hurts all over, nothing specific, denies chest pain  " I do not know where I am" Would like her husband to come visit her  Peak troponin 848 now trending downward 767 Normal ejection fraction 55 to 60% with no focal wall motion abnormality, moderate MR, moderate MS, Mean gradient through bioprosthetic valve 15 mmHg  Inpatient Medications    Scheduled Meds:  aspirin  81 mg Oral Daily   azithromycin  500 mg Oral q1800   Chlorhexidine Gluconate Cloth  6 each Topical Daily   donepezil  10 mg Oral QHS   heparin injection (subcutaneous)  5,000 Units Subcutaneous Q12H   insulin aspart  0-5 Units Subcutaneous QHS   insulin aspart  0-9 Units Subcutaneous TID WC   levothyroxine  88 mcg Oral Daily   sertraline  100 mg Oral QHS   thiamine  100 mg Oral Daily   Continuous Infusions:  cefTRIAXone (ROCEPHIN)  IV Stopped (05/20/23 1655)   PRN Meds: acetaminophen, ALPRAZolam, docusate sodium, guaiFENesin, ipratropium-albuterol, ondansetron (ZOFRAN) IV, mouth rinse, polyethylene glycol   Vital Signs    Vitals:   05/21/23 0700 05/21/23 0800 05/21/23 0900 05/21/23 1214  BP: (!) 114/53 (!) 111/54 (!) 119/53   Pulse: 90 89 96   Resp: (!) 26 (!) 22 (!) 23   Temp:  98.8 F (37.1 C)    TempSrc:  Oral    SpO2: 97% 96% 97%   Weight:      Height:    4\' 8"  (1.422 m)    Intake/Output Summary (Last 24 hours) at 05/21/2023 1234 Last data filed at 05/21/2023 0600 Gross per 24 hour  Intake 606.91 ml  Output 300 ml  Net 306.91 ml      05/21/2023    5:00 AM 05/20/2023    5:00 AM 05/19/2023    3:12 PM  Last 3 Weights  Weight (lbs) 99 lb 3.3 oz 100 lb 5 oz 96 lb  Weight  (kg) 45 kg 45.5 kg 43.545 kg      Telemetry    Normal sinus rhythm- Personally Reviewed  ECG     - Personally Reviewed  Physical Exam   GEN: No acute distress.   Neck: No JVD Cardiac: RRR, no murmurs, rubs, or gallops.  Respiratory: Clear to auscultation bilaterally. GI: Soft, nontender, non-distended  MS: No edema; No deformity. Neuro:  Nonfocal  Psych: Normal affect   Labs    High Sensitivity Troponin:   Recent Labs  Lab 05/20/23 0450 05/20/23 1042 05/20/23 1309 05/20/23 2117 05/20/23 2330  TROPONINIHS 271* 718* 848* 777* 767*     Chemistry Recent Labs  Lab 05/19/23 1559 05/19/23 1727 05/20/23 0213 05/20/23 0450 05/20/23 1309 05/21/23 0308  NA 139  --  136  --  133* 136  K 3.1*  --  3.4*  --  5.0 4.6  CL 108  --  108  --  106 110  CO2 18*  --  20*  --  20* 21*  GLUCOSE 177*  --  113*  --  88 95  BUN 15  --  20  --  27* 32*  CREATININE 1.08*  --  1.27*  --  1.26* 1.12*  CALCIUM 8.3*  --  7.6*  --  7.4* 7.4*  MG  --  1.1* 2.7*  --   --  2.6*  PROT 5.7*  --   --  5.4*  --   --   ALBUMIN 2.6*  --   --  2.4*  --   --   AST 168*  --   --  874*  --   --   ALT 56*  --   --  256*  --   --   ALKPHOS 58  --   --  49  --   --   BILITOT 0.9  --   --  0.9  --   --   GFRNONAA 54*  --  44*  --  45* 52*  ANIONGAP 13  --  8  --  7 5    Lipids No results for input(s): "CHOL", "TRIG", "HDL", "LABVLDL", "LDLCALC", "CHOLHDL" in the last 168 hours.  Hematology Recent Labs  Lab 05/19/23 1559 05/20/23 0213 05/21/23 0308  WBC 11.9* 14.8* 12.3*  RBC 3.68* 3.40* 3.21*  HGB 11.1* 10.0* 9.6*  HCT 33.9* 30.5* 28.5*  MCV 92.1 89.7 88.8  MCH 30.2 29.4 29.9  MCHC 32.7 32.8 33.7  RDW 15.8* 15.9* 16.2*  PLT 65* 85* 70*   Thyroid  Recent Labs  Lab 05/19/23 1727  TSH 1.563  FREET4 0.89    BNP Recent Labs  Lab 05/19/23 1558  BNP 881.6*    DDimer No results for input(s): "DDIMER" in the last 168 hours.   Radiology    US RENAL  Result Date:  05/20/2023 CLINICAL DATA:  Acute renal failure. EXAM: RENAL / URINARY TRACT ULTRASOUND COMPLETE COMPARISON:  CT abdomen pelvis dated December 25, 2021. FINDINGS: Right Kidney: Renal measurements: 10.4 x 4.1 x 4.9 cm = volume: 110 mL. Increased echogenicity. No mass or hydronephrosis visualized. Left Kidney: Renal measurements: 10.4 x 4.3 x 5.3 cm = volume: 123 mL. Increased echogenicity. No mass or hydronephrosis visualized. Bladder: Appears normal for degree of bladder distention. Other: Small ascites in the right upper quadrant. IMPRESSION: 1. Increased bilateral renal echogenicity as can be seen with medical renal disease. No hydronephrosis. 2. Small ascites in the right upper quadrant. Electronically Signed   By: Obie Dredge M.D.   On: 05/20/2023 15:28   ECHOCARDIOGRAM COMPLETE  Result Date: 05/20/2023    ECHOCARDIOGRAM REPORT   Patient Name:   Jenny [name] Date of Exam: 05/20/2023 Medical Rec #:  [id number]        Height:       56.0 in Accession #:    [id number]       Weight:       100.3 lb Date of Birth:  [birth year]       BSA:          1.324 m Patient Age:    77 years         BP:           101/40 mmHg Patient Gender: F                HR:           103 bpm. Exam Location:  [facility] Procedure: 2D Echo, Cardiac Doppler and Color Doppler Indications:     Elevated Troponin  History:         Patient has prior history of Echocardiogram examinations, most                  recent 04/08/2017. CHF, Stroke, Aortic Valve Disease; Risk  Factors:Hypertension, Diabetes and Former Smoker. There is a                  Bovine prosthetic valve in the Aortic position which was placed                  on 02/17/2017.                  Aortic Valve: 19 mm Magna bioprosthetic valve is present in the                  aortic position.  Sonographer:     Mikki Harbor Referring Phys:  [id number] Neldon [place] NELSON Diagnosing Phys: Yvonne Kendall MD IMPRESSIONS  1. Left ventricular ejection fraction, by estimation, is 55 to  60%. The left ventricle has normal function. The left ventricle has no regional wall motion abnormalities. There is mild left ventricular hypertrophy. Left ventricular diastolic function could not be evaluated.  2. Right ventricular systolic function is normal. The right ventricular size is normal. There is normal pulmonary artery systolic pressure.  3. Left atrial size was moderately dilated.  4. The mitral valve is abnormal. Moderate mitral valve regurgitation. Moderate mitral stenosis. The mean mitral valve gradient is 9.0 mmHg.  5. Tricuspid valve regurgitation is moderate.  6. The aortic valve was not well visualized. There is moderate calcification of the aortic valve. There is severe thickening of the aortic valve. Aortic valve regurgitation is not visualized. Mild aortic valve stenosis. There is a 19 mm Magna bioprosthetic valve present in the aortic position. Aortic valve area, by VTI measures 1.96 cm. Aortic valve mean gradient measures 15.3 mmHg.  7. Pulmonic valve regurgitation is moderate.  8. The inferior vena cava is normal in size with greater than 50% respiratory variability, suggesting right atrial pressure of 3 mmHg. FINDINGS  Left Ventricle: Left ventricular ejection fraction, by estimation, is 55 to 60%. The left ventricle has normal function. The left ventricle has no regional wall motion abnormalities. The left ventricular internal cavity size was normal in size. There is  mild left ventricular hypertrophy. Left ventricular diastolic function could not be evaluated due to mitral stenosis. Left ventricular diastolic function could not be evaluated. Right Ventricle: The right ventricular size is normal. No increase in right ventricular wall thickness. Right ventricular systolic function is normal. There is normal pulmonary artery systolic pressure. The tricuspid regurgitant velocity is 1.87 m/s, and  with an assumed right atrial pressure of 3 mmHg, the estimated right ventricular systolic  pressure is 17.0 mmHg. Left Atrium: Left atrial size was moderately dilated. Right Atrium: Right atrial size was normal in size. Pericardium: There is no evidence of pericardial effusion. Mitral Valve: The mitral valve is abnormal. There is severe thickening of the mitral valve leaflet(s). There is mild calcification of the mitral valve leaflet(s). Moderate mitral valve regurgitation. Moderate mitral valve stenosis. MV peak gradient, 57.2  mmHg. The mean mitral valve gradient is 9.0 mmHg. Tricuspid Valve: The tricuspid valve is not well visualized. Tricuspid valve regurgitation is moderate. Aortic Valve: The aortic valve was not well visualized. There is moderate calcification of the aortic valve. There is severe thickening of the aortic valve. Aortic valve regurgitation is not visualized. Mild aortic stenosis is present. Aortic valve mean gradient measures 15.3 mmHg. Aortic valve peak gradient measures 27.5 mmHg. Aortic valve area, by VTI measures 1.96 cm. There is a 19 mm Magna bioprosthetic valve present in the aortic position. Pulmonic Valve: The pulmonic valve was  normal in structure. Pulmonic valve regurgitation is moderate. Aorta: The aortic root and ascending aorta are structurally normal, with no evidence of dilitation. Pulmonary Artery: The pulmonary artery is of normal size. Venous: The inferior vena cava is normal in size with greater than 50% respiratory variability, suggesting right atrial pressure of 3 mmHg. IAS/Shunts: No atrial level shunt detected by color flow Doppler.  LEFT VENTRICLE PLAX 2D LVIDd:         3.80 cm     Diastology LVIDs:         2.60 cm     LV e' medial:    9.36 cm/s LV PW:         1.20 cm     LV E/e' medial:  20.9 LV IVS:        1.20 cm     LV e' lateral:   7.72 cm/s LVOT diam:     2.00 cm     LV E/e' lateral: 25.4 LV SV:         100 LV SV Index:   75 LVOT Area:     3.14 cm  LV Volumes (MOD) LV vol d, MOD A2C: 57.3 ml LV vol d, MOD A4C: 59.7 ml LV vol s, MOD A2C: 30.1 ml LV vol  s, MOD A4C: 28.6 ml LV SV MOD A2C:     27.2 ml LV SV MOD A4C:     59.7 ml LV SV MOD BP:      29.3 ml RIGHT VENTRICLE RV Basal diam:  3.40 cm RV Mid diam:    3.10 cm RV S prime:     18.00 cm/s TAPSE (M-mode): 1.7 cm LEFT ATRIUM             Index        RIGHT ATRIUM           Index LA diam:        3.80 cm 2.87 cm/m   RA Area:     14.40 cm LA Vol (A2C):   73.6 ml 55.59 ml/m  RA Volume:   35.00 ml  26.44 ml/m LA Vol (A4C):   49.6 ml 37.46 ml/m LA Biplane Vol: 61.7 ml 46.60 ml/m  AORTIC VALVE                     PULMONIC VALVE AV Area (Vmax):    2.04 cm      PV Vmax:       1.24 m/s AV Area (Vmean):   1.96 cm      PV Peak grad:  6.2 mmHg AV Area (VTI):     1.96 cm AV Vmax:           262.00 cm/s AV Vmean:          177.667 cm/s AV VTI:            0.509 m AV Peak Grad:      27.5 mmHg AV Mean Grad:      15.3 mmHg LVOT Vmax:         170.00 cm/s LVOT Vmean:        111.000 cm/s LVOT VTI:          0.318 m LVOT/AV VTI ratio: 0.63  AORTA Ao Root diam: 3.50 cm Ao Asc diam:  2.80 cm MITRAL VALVE                  TRICUSPID VALVE MV Area (PHT): 3.20 cm  TR Peak grad:   14.0 mmHg MV Area VTI:   1.09 cm       TR Vmax:        187.00 cm/s MV Peak grad:  57.2 mmHg MV Mean grad:  9.0 mmHg       SHUNTS MV Vmax:       3.78 m/s       Systemic VTI:  0.32 m MV Vmean:      274.5 cm/s     Systemic Diam: 2.00 cm MV Decel Time: 237 msec MR Peak grad:    114.5 mmHg MR Mean grad:    78.0 mmHg MR Vmax:         535.00 cm/s MR Vmean:        416.0 cm/s MR PISA:         1.01 cm MR PISA Eff ROA: 17 mm MR PISA Radius:  0.40 cm MV E velocity: 196.00 cm/s MV A velocity: 158.00 cm/s MV E/A ratio:  1.24 Cristal Deer End MD Electronically signed by Yvonne Kendall MD Signature Date/Time: 05/20/2023/2:19:22 PM    Final    DG Chest Port 1 View  Result Date: 05/20/2023 CLINICAL DATA:  Pneumonia EXAM: PORTABLE CHEST 1 VIEW COMPARISON:  Yesterday FINDINGS: Extensive interstitial and airspace density. Cardiomegaly. Aortic valve replacement. A small  left pleural effusion is likely. Clear apical lungs. IMPRESSION: 1. Worsening aeration, generalized appearance and rapid progression favoring edema. There is asymmetric left-sided airspace disease and pneumonia may be coexistent. 2. Background of chronic lung disease and mild fibrosis by prior chest CT imaging Electronically Signed   By: Tiburcio Pea M.D.   On: 05/20/2023 05:11   DG Abd 1 View  Result Date: 05/19/2023 CLINICAL DATA:  Vomiting EXAM: ABDOMEN - 1 VIEW COMPARISON:  12/25/2021 CT FINDINGS: Prior cholecystectomy. Nonobstructive bowel gas pattern. No organomegaly or free air. Diffuse aortic calcifications. No visible aneurysm. IMPRESSION: No acute findings. Electronically Signed   By: Charlett Nose M.D.   On: 05/19/2023 21:25   DG Chest Port 1 View  Result Date: 05/19/2023 CLINICAL DATA:  Questionable sepsis. EXAM: PORTABLE CHEST 1 VIEW COMPARISON:  Chest radiograph dated 12/25/2021 and CT dated 02/18/2022. FINDINGS: There is background of chronic interstitial coarsening. An area of increased opacity in the left mid lung field most concerning for developing infiltrate. Additional smaller density in the right lower lung field also concerning for infiltrate. No pleural effusion or pneumothorax. The cardiac silhouette is within normal limits. Median sternotomy wires and mechanical cardiac valve. Atherosclerotic calcification of the aorta. No acute osseous pathology. IMPRESSION: Bilateral pulmonary infiltrates, left greater than right. Electronically Signed   By: Elgie Collard M.D.   On: 05/19/2023 18:42    Cardiac Studies     Patient Profile     Jenny [name] is a 77 y.o. female with a history of nonobs CAD, severe AS s/p bioprosthetic AVR, HFpEF, DMII, HL, TIA, stroke, iron def anemia, anxiety, and hypothyroidism, who is being seen today for the evaluation of elevated troponin and valvular heart dzs in the setting of sepsis   Assessment & Plan    Acute respiratory  distress/pneumonia/sepsis Several weeks cough, shortness of breath, hypotensive with lactic acidosis on arrival Chest x-ray suggestive of pneumonia On antibiotics, Off pressors  Non-STEMI In the setting of respiratory distress, pneumonia, sepsis Peak troponin 848 In the setting of thrombocytopenia, and anemia, Not on heparin infusion On DVT prophylaxis heparin with aspirin Not on beta-blocker in the setting of hypotension/sepsis Normal ejection fraction on  echo -Consider ischemic workup following recovery  Aortic valve stenosis, status post bioprosthetic aortic valve Appropriate gradient on echo 15 mmHg  Mitral valve disease Elevated gradient concerning for stenosis Following recovery, from outpatient evaluation could consider additional imaging if clinically indicated  Transaminitis INR 1.5, right upper quadrant ultrasound suggestive of cirrhosis She denies alcohol   For questions or updates, please contact [facility] Please consult [email or web address] for contact info under        Signed, Julien Nordmann, MD  05/21/2023, 12:34 PM

## 2023-05-21 NOTE — Progress Notes (Signed)
NAME:  Jenny [name], MRN:  [id number], DOB:  [birth year], LOS: 1 ADMISSION DATE:  05/19/2023, CONSULTATION DATE: 05/19/2023 REFERRING MD: Dr. Roxan Hockey, CHIEF COMPLAINT: Emesis   History of Present Illness:  This is a 77 yo female who presented to [facility] ER on 11/18 via EMS with c/o "not feeling well", nausea, and vomiting.  EMS reported when they arrived at pts home she was lethargic and diaphoretic with generalized weakness.  She received iv fluids en route to the ER.  Pts husband reports the pt has had a cough over the past 2 weeks, however her cough worsened today and she vomited bile prompting EMS notification.  05/21/23- patient with markedly elevated CRP, had some confusion we dcd centrally acting medications.   Pertinent  Medical History  Anxiety  Arthritis  CAD  Chronic Systolic CHF Depression  Type II Diabetes Mellitus  HLD Hypothyroidism  IBS Iron Deficiency Anemia  Osteoporosis  Panic Attacks  Severe Aortic Stenosis  Stroke  TIA   Significant Hospital Events: Including procedures, antibiotic start and stop dates in addition to other pertinent events   11/18: Pt admitted with septic shock secondary to pneumonia requiring levophed gtt   Interim History / Subjective:  Pt with frequent productive cough and complains of worsening chest pain with coughing consistent with pleuritic chest pain.  Pt currently requiring levophed gtt @4mcg /min to maintain map 65 or higher  Objective   Blood pressure (!) 119/53, pulse 96, temperature 98.8 F (37.1 C), temperature source Oral, resp. rate (!) 23, weight 45 kg, SpO2 97%.        Intake/Output Summary (Last 24 hours) at 05/21/2023 1022 Last data filed at 05/21/2023 0600 Gross per 24 hour  Intake 606.91 ml  Output 400 ml  Net 206.91 ml   Filed Weights   05/19/23 1512 05/20/23 0500 05/21/23 0500  Weight: 43.5 kg 45.5 kg 45 kg    Examination: General: Acute on chronically-ill appearing cachetic female, NAD on 2L  O2 HENT: Supple, no JVD  Lungs: Diffuse rhonchi throughout, even, non labored  Cardiovascular: Sinus tachycardia, s1s2, no m/r/g, 2+ radial/1+ distal pulses, no edema  Abdomen: +BS x4, non tender, non distended  Extremities: Moves all extremities  Skin: Generalized abrasions present on admission Neuro: Alert, confused to place, following commands, PERRLA  GU: Deferred  IMAGING     Narrative & Impression  CLINICAL DATA:  Pneumonia   EXAM: PORTABLE CHEST 1 VIEW   COMPARISON:  Yesterday   FINDINGS: Extensive interstitial and airspace density. Cardiomegaly. Aortic valve replacement. A small left pleural effusion is likely. Clear apical lungs.   IMPRESSION: 1. Worsening aeration, generalized appearance and rapid progression favoring edema. There is asymmetric left-sided airspace disease and pneumonia may be coexistent. 2. Background of chronic lung disease and mild fibrosis by prior chest CT imaging     Electronically Signed   By: Tiburcio Pea M.D.   On: 05/20/2023 05:11    Assessment & Plan:   #Acute respiratory failure secondary to pneumonia  - Supplemental O2 for dyspnea and/or hypoxia  - Maintain O2 sats 92% or above - Chest PT - Prn bronchodilator therapy -crp and procalcitonin trending  #Septic shock- PRESENT ON ADMISSION - DUE TO PNEUMONIA #Elevated troponin suspect secondary to demand ischemia  #Chronic systolic CHF  Hx: CAD, HLD, Stroke, Severe Aortic Stenosis, and TIA  - Continuous telemetry monitoring  - Trend troponin's until peaked  - Prn levophed gtt to maintain map 65 or higher  - BNP pending - Echo  pending  - TSH and free T4 pending   #Mild acute kidney injury secondary ATN  #Metabolic acidosis  #Lactic acidosis  #Hypokalemia  - Trend BMP and lactic acid  - Stat VBG, magnesium, and phosphorous pending  - Replace electrolytes as indicated  - Strict intake and output  - Avoid hepatotoxic medications as able   #Transaminitis - Trend  hepatic function panel  - Avoid hepatotoxic medications as able   #Sepsis  #Pneumonia  - Trend WBC and monitor fever curve  - Trend PCT  - Follow cultures  - Continue azithromycin and rocephin   #Iron deficiency anemia  #Acute on chronic thrombocytopenia  - Trend CBC  - Monitor for s/sx of bleeding  - Transfuse for hgb <7 - Avoid chemical VTE px   #Nausea/vomiting  - Prn zofran for nausea/vomiting  - Clear liquid diet  - KUB pending   #Type II diabetes mellitus  - Hemoglobin A1c pending  - CBG's ac/hs  - SSI   Best Practice (right click and "Reselect all SmartList Selections" daily)   Diet/type: clear liquids DVT prophylaxis: SCD GI prophylaxis: N/A Lines: N/A Foley:  N/A Code Status:  full code Last date of multidisciplinary goals of care discussion [N/A]  11/18: Pt and pts husband updated regarding pts condition and current plan of care  Labs   CBC: Recent Labs  Lab 05/19/23 1559 05/20/23 0213 05/21/23 0308  WBC 11.9* 14.8* 12.3*  NEUTROABS 10.8*  --  10.6*  HGB 11.1* 10.0* 9.6*  HCT 33.9* 30.5* 28.5*  MCV 92.1 89.7 88.8  PLT 65* 85* 70*    Basic Metabolic Panel: Recent Labs  Lab 05/19/23 1559 05/19/23 1727 05/20/23 0213 05/20/23 1309 05/21/23 0308  NA 139  --  136 133* 136  K 3.1*  --  3.4* 5.0 4.6  CL 108  --  108 106 110  CO2 18*  --  20* 20* 21*  GLUCOSE 177*  --  113* 88 95  BUN 15  --  20 27* 32*  CREATININE 1.08*  --  1.27* 1.26* 1.12*  CALCIUM 8.3*  --  7.6* 7.4* 7.4*  MG  --  1.1* 2.7*  --  2.6*  PHOS  --  1.9* 1.2* 3.9 3.0   GFR: Estimated Creatinine Clearance: 27.7 mL/min (A) (by C-G formula based on SCr of 1.12 mg/dL (H)). Recent Labs  Lab 05/19/23 1559 05/19/23 1727 05/19/23 2303 05/20/23 0213 05/20/23 0450 05/21/23 0308  PROCALCITON  --  31.96  --   --   --  81.93  WBC 11.9*  --   --  14.8*  --  12.3*  LATICACIDVEN 6.3* 6.5* 4.8* 4.6* 3.5*  --     Liver Function Tests: Recent Labs  Lab 05/19/23 1559  05/20/23 0450  AST 168* 874*  ALT 56* 256*  ALKPHOS 58 49  BILITOT 0.9 0.9  PROT 5.7* 5.4*  ALBUMIN 2.6* 2.4*   No results for input(s): "LIPASE", "AMYLASE" in the last 168 hours. No results for input(s): "AMMONIA" in the last 168 hours.  ABG    Component Value Date/Time   PHART 7.330 (L) 02/18/2017 1630   PCO2ART 38.5 02/18/2017 1630   PO2ART 72.0 (L) 02/18/2017 1630   HCO3 20.3 02/18/2017 1630   TCO2 25 02/19/2017 1657   ACIDBASEDEF 5.0 (H) 02/18/2017 1630   O2SAT 72.9 02/20/2017 0455     Coagulation Profile: Recent Labs  Lab 05/19/23 1559  INR 1.5*    Cardiac Enzymes: No results for input(s): "CKTOTAL", "  CKMB", "CKMBINDEX", "TROPONINI" in the last 168 hours.  HbA1C: Hemoglobin A1C  Date/Time Value Ref Range Status  02/19/2021 03:03 PM 5.6 4.0 - 5.6 % Final  10/19/2020 03:49 PM 6.2 (A) 4.0 - 5.6 % Final  10/23/2014 05:40 AM 8.6 (H) % Final    Comment:    4.0-6.0 NOTE: New Reference Range  09/06/14    HbA1c POC (<> result, manual entry)  Date/Time Value Ref Range Status  03/19/2022 02:57 PM 6.0 4.0 - 5.6 % Final   Hgb A1c MFr Bld  Date/Time Value Ref Range Status  05/19/2023 09:39 PM 5.3 4.8 - 5.6 % Final    Comment:    (NOTE) Pre diabetes:          5.7%-6.4%  Diabetes:              >6.4%  Glycemic control for   <7.0% adults with diabetes   05/23/2020 03:14 PM 5.6 4.8 - 5.6 % Final    Comment:             Prediabetes: 5.7 - 6.4          Diabetes: >6.4          Glycemic control for adults with diabetes: <7.0     CBG: Recent Labs  Lab 05/20/23 1130 05/20/23 1700 05/20/23 1941 05/20/23 2148 05/21/23 0951  GLUCAP 84 71 111* 86 72    Review of Systems: Positives in BOLD   Gen: poor po intake, fever, chills, weight change, fatigue, night sweats HEENT: Denies blurred vision, double vision, hearing loss, tinnitus, sinus congestion, rhinorrhea, sore throat, neck stiffness, dysphagia PULM: shortness of breath, cough, sputum production,  hemoptysis, wheezing CV: Denies chest pain, edema, orthopnea, paroxysmal nocturnal dyspnea, palpitations GI: abdominal pain, nausea, vomiting, diarrhea, hematochezia, melena, constipation, change in bowel habits GU: Denies dysuria, hematuria, polyuria, oliguria, urethral discharge Endocrine: Denies hot or cold intolerance, polyuria, polyphagia or appetite change Derm: Denies rash, dry skin, scaling or peeling skin change Heme: Denies easy bruising, bleeding, bleeding gums Neuro: Denies headache, numbness, weakness, slurred speech, loss of memory or consciousness  Past Medical History:  She,  has a past medical history of Anxiety, Arthritis, CAD (coronary artery disease), Depression, Diabetes mellitus with complication (HCC), Heart failure with improved ejection fraction (HFimpEF) (HCC), Hyperlipidemia, Hypothyroidism, IBS (irritable bowel syndrome), Iron deficiency anemia, Microcytic anemia, Osteoporosis, Panic attacks, Severe aortic stenosis, Stroke (HCC) (2001), and TIA (transient ischemic attack) (2006).   Surgical History:   Past Surgical History:  Procedure Laterality Date   ABDOMINAL HYSTERECTOMY     AORTIC VALVE REPLACEMENT N/A 02/17/2017   Procedure: AORTIC VALVE REPLACEMENT (AVR);  Surgeon: Donata Clay, Theron Arista, MD;  Location: [facility] OR;  Service: Open Heart Surgery;  Laterality: N/A;   CATARACT EXTRACTION W/PHACO Right 04/09/2016   Procedure: CATARACT EXTRACTION PHACO AND INTRAOCULAR LENS PLACEMENT (IOC);  Surgeon: Galen Manila, MD;  Location: [facility] ORS;  Service: Ophthalmology;  Laterality: Right;  [place] 57.2 AP% 18.6 CDE 10.64 Fluid Pack lot # [id number]   CATARACT EXTRACTION W/PHACO Left 12/29/2018   Procedure: CATARACT EXTRACTION PHACO AND INTRAOCULAR LENS PLACEMENT (IOC)  LEFT DIABETIC;  Surgeon: Galen Manila, MD;  Location: [facility];  Service: Ophthalmology;  Laterality: Left;  Diabetic - insuli and oral meds   CHOLECYSTECTOMY     COLONOSCOPY     CORONARY ANGIOGRAPHY  N/A 02/03/2017   Procedure: CORONARY ANGIOGRAPHY;  Surgeon: Iran Ouch, MD;  Location: [facility] INVASIVE CV LAB;  Service: Cardiovascular;  Laterality: N/A;   FRACTURE  SURGERY     LEFT ANKLE   RIGHT AND LEFT HEART CATH Bilateral 02/03/2017   Procedure: Right and Left Heart Cath;  Surgeon: Iran Ouch, MD;  Location: [facility] INVASIVE CV LAB;  Service: Cardiovascular;  Laterality: Bilateral;   TEE WITHOUT CARDIOVERSION N/A 02/17/2017   Procedure: TRANSESOPHAGEAL ECHOCARDIOGRAM (TEE);  Surgeon: Donata Clay, Theron Arista, MD;  Location: [facility] OR;  Service: Open Heart Surgery;  Laterality: N/A;   TIBIA IM NAIL INSERTION Left 05/19/2018   Procedure: INTRAMEDULLARY (IM) NAIL TIBIAL;  Surgeon: Juanell Fairly, MD;  Location: [facility] ORS;  Service: Orthopedics;  Laterality: Left;     Social History:   reports that she quit smoking about 23 years ago. Her smoking use included cigarettes. She started smoking about 53 years ago. She has never used smokeless tobacco. She reports that she does not drink alcohol and does not use drugs.   Family History:  Her family history includes Alzheimer's disease in her mother; Cancer in her maternal grandfather and maternal grandmother; Diabetes in her brother and father; Hypertension in her mother; Parkinson's disease in her mother. There is no history of Neuropathy.   Allergies Allergies  Allergen Reactions   Iodine Swelling    ANGIOEDEMA FACIAL SWELLING "BETADINE OKAY"   Shellfish Allergy Anaphylaxis     Home Medications  Prior to Admission medications   Medication Sig Start Date End Date Taking? Authorizing Provider  alendronate (FOSAMAX) 70 MG tablet Take 1 tablet (70 mg total) by mouth once a week. Take with a full glass of water on an empty stomach. 05/27/22   Corky Downs, MD  ALPRAZolam Prudy Feeler) 0.5 MG tablet Take 1 tablet (0.5 mg total) by mouth 2 (two) times daily. 07/03/22   Corky Downs, MD  Cholecalciferol (VITAMIN D3 PO) Take by mouth daily.    [provider]  Cranberry-Vitamin C (AZO CRANBERRY URINARY TRACT) 250-60 MG CAPS Take 1 each by mouth 2 (two) times daily. 04/30/22   Corky Downs, MD  donepezil (ARICEPT) 10 MG tablet Take 1 tablet (10 mg total) by mouth at bedtime. 05/19/23   Lomax, Amy, NP  glucose blood (ONETOUCH ULTRA) test strip TEST BLOOD SUGAR THREE TIMES DAILY 11/02/21   Corky Downs, MD  hydrOXYzine (ATARAX/VISTARIL) 25 MG tablet Take 25 mg by mouth 2 (two) times daily.     [provider]  insulin glargine (LANTUS) 100 UNIT/ML injection Inject 0.25-0.35 mLs (25-35 Units total) into the skin See admin instructions. 35 units every morning and 25 units at bedtime 07/03/22   Corky Downs, MD  Insulin Syringe-Needle U-100 (INSULIN SYRINGE .5CC/31GX5/16") 31G X 5/16" 0.5 ML MISC USE 1 NEEDLE TWICE DAILY 02/18/22   Corky Downs, MD  Lancets (ONETOUCH DELICA PLUS LANCET30G) MISC USE TO TEST BLOOD SUGAR TWICE DAILY 06/27/22   Corky Downs, MD  levocetirizine (XYZAL) 5 MG tablet TAKE 1 TABLET(5 MG) BY MOUTH EVERY EVENING 08/14/21   Corky Downs, MD  levothyroxine (SYNTHROID) 88 MCG tablet TAKE 1 TABLET BY MOUTH DAILY 08/28/21   Corky Downs, MD  Loperamide HCl (IMODIUM A-D PO) Take by mouth as needed.    [provider]  Melatonin 10 MG TABS Take by mouth at bedtime.    [provider]  metFORMIN (GLUCOPHAGE) 500 MG tablet Take 1 tablet (500 mg total) by mouth 2 (two) times daily. 10/01/21   Corky Downs, MD  rosuvastatin (CRESTOR) 10 MG tablet TAKE 1 TABLET BY MOUTH DAILY 03/28/22   Corky Downs, MD  sertraline (ZOLOFT) 100 MG tablet TAKE  1 TABLET(100 MG) BY MOUTH TWICE DAILY 05/13/22   Kara Dies, NP  sitaGLIPtin (JANUVIA) 100 MG tablet TAKE 1 TABLET BY MOUTH EVERY MORNING 07/02/22   Corky Downs, MD     Critical care provider statement:   Total critical care time: 33 minutes   Performed by: Karna Christmas MD   Critical care time was exclusive of separately billable procedures and treating other  patients.   Critical care was necessary to treat or prevent imminent or life-threatening deterioration.   Critical care was time spent personally by me on the following activities: development of treatment plan with patient and/or surrogate as well as nursing, discussions with consultants, evaluation of patient's response to treatment, examination of patient, obtaining history from patient or surrogate, ordering and performing treatments and interventions, ordering and review of laboratory studies, ordering and review of radiographic studies, pulse oximetry and re-evaluation of patient's condition.    Vida Rigger, M.D.  Pulmonary & Critical Care Medicine

## 2023-05-21 NOTE — Evaluation (Signed)
Occupational Therapy Evaluation Patient Details Name: Jenny [name] MRN: [id number] DOB: 05-24-1946 Today's Date: 05/21/2023   History of Present Illness 77 y.o. female presented to [facility] ER on 11/18 via EMS with c/o "not feeling well", nausea, and vomiting. MD assessment: elevated troponin and valvular heart dzs in the setting of sepsis and AKI.  PMHx: CAD, severe AS s/p bioprosthetic AVR, HFpEF, DMII, HL, TIA, stroke, iron def anemia, COPD, anxiety, and hypothyroidism.   Clinical Impression   Pt was seen for PT/OT evaluation this date. Prior to hospital admission, pt lives with her husband. Husband provided most of history d/t pt being lethargic and slower to answer. 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 She was IND with ADLs and sponge bathed for the last year, was able to perform light IADLs. Reports she has about 1 fall a week.  Pt presents to acute OT demonstrating impaired ADL performance and functional mobility 2/2 weakness, pain, balance deficits and low activity tolerance (See OT problem list for additional functional deficits). Pt oriented to place, time and person. Pt currently requires Max A for bed mobility with cueing for hand placement. Max A for donning socks in bed. Mod A progressing to CGA for seated balance at EOB with inability to lateral scoot towards [facility] requiring Max A from therapists.  Pt had BM and nursing was notified. Pt stating at end of session "I just want to be left alone to rest." Pt would benefit from skilled OT services to address noted impairments and functional limitations (see below for any additional details) in order to maximize safety and independence while minimizing falls risk and caregiver burden. Do anticipate the need for follow up OT services upon acute hospital DC due to significant decline from pt's baseline.        If plan is discharge home, recommend the following: A lot of help with walking  and/or transfers;Direct supervision/assist for medications management;Supervision due to cognitive status;Direct supervision/assist for financial management;Assistance with cooking/housework;Assist for transportation;Help with stairs or ramp for entrance;A lot of help with bathing/dressing/bathroom    Functional Status Assessment  Patient has had a recent decline in their functional status and demonstrates the ability to make significant improvements in function in a reasonable and predictable amount of time.  Equipment Recommendations  Other (comment) (defer)    Recommendations for Other Services       Precautions / Restrictions Precautions Precautions: Fall Restrictions Weight Bearing Restrictions: No      Mobility Bed Mobility Overal bed mobility: Needs Assistance Bed Mobility: Supine to Sit, Sit to Supine     Supine to sit: Used rails, HOB elevated, Max assist Sit to supine: Max assist, Used rails   General bed mobility comments: Max A for bed mobility tasks with cueing for hand placement on bed rails    Transfers                   General transfer comment: NT as pt declined and is significantly weak and tired during eval      Balance Overall balance assessment: Needs assistance Sitting-balance support: Feet supported, Bilateral upper extremity supported Sitting balance-Leahy Scale: Poor Sitting balance - Comments: pt initially needing Mod A to maintain seated balance d/t L lateral lean, but able to self correct and sit with CGA with time  ADL either performed or assessed with clinical judgement   ADL Overall ADL's : Needs assistance/impaired                     Lower Body Dressing: Maximal assistance;Total assistance;Bed level                 General ADL Comments: pt likely to need Max A with LB ADLs and Min A with UB ADLs     Vision         Perception         Praxis          Pertinent Vitals/Pain Pain Assessment Pain Assessment: Faces Faces Pain Scale: Hurts a little bit Pain Location: fingers, RLE Pain Descriptors / Indicators: Sore Pain Intervention(s): Limited activity within patient's tolerance, Monitored during session     Extremity/Trunk Assessment Upper Extremity Assessment Upper Extremity Assessment: Generalized weakness   Lower Extremity Assessment Lower Extremity Assessment: Generalized weakness       Communication Communication Communication: Difficulty following commands/understanding;Difficulty communicating thoughts/reduced clarity of speech (pt speaks low, is tired and at times hard to understand; will state "I can't do that." as soon as asked to do something) Following commands: Follows one step commands with increased time Cueing Techniques: Verbal cues;Tactile cues   Cognition Arousal: Lethargic                                     General Comments: awakens/opens eyes when asked, but otherwise lethargic; oriented x3     General Comments  pt had BM at end of session, nursing notified    Exercises Other Exercises Other Exercises: Edu on role OT in acute setting and importance of therapy to maximize her strength and IND to return to PLOF.   Shoulder Instructions      Home Living Family/patient expects to be discharged to:: Private residence Living Arrangements: Spouse/significant other Available Help at Discharge: Family;Available 24 hours/day Type of Home: House Home Access: Stairs to enter [employer] of Steps: 4 Entrance Stairs-Rails: Right;Left;Can reach both Home Layout: Two level;Able to live on main level with bedroom/bathroom     Bathroom Shower/Tub: Tub/shower unit;Sponge bathes at baseline         Home Equipment: Rollator (4 wheels);Shower seat          Prior Functioning/Environment Prior Level of Function : Independent/Modified Independent             Mobility  Comments: furniture surfs in the home, uses rollator outside of the home; spouse assists with getting up/down steps outside of home; spouse reports ~1 fall a week "falls backwards" ADLs Comments: IND, sponge bathes as baseline for the last year d/t a fall in the shower; able to perform laundry, light meal prep, etc.        OT Problem List: Decreased strength;Pain;Decreased activity tolerance;Decreased safety awareness;Impaired balance (sitting and/or standing);Decreased cognition      OT Treatment/Interventions: Self-care/ADL training;Therapeutic exercise;Therapeutic activities;Energy conservation;Patient/family education;DME and/or AE instruction;Balance training    OT Goals(Current goals can be found in the care plan section) Acute Rehab OT Goals Patient Stated Goal: improve strength and feel better OT Goal Formulation: With patient/family Time For Goal Achievement: 06/04/23 Potential to Achieve Goals: Fair ADL Goals Pt Will Perform Grooming: with supervision;sitting Pt Will Perform Lower Body Bathing: with contact guard assist;sit to/from stand;sitting/lateral leans Pt Will Perform Lower Body Dressing: with contact guard assist;sitting/lateral leans;sit  to/from stand Pt Will Transfer to Toilet: with contact guard assist;bedside commode;stand pivot transfer Pt Will Perform Toileting - Clothing Manipulation and hygiene: with contact guard assist;sit to/from stand;sitting/lateral leans Additional ADL Goal #1: Pt will perform bed mobility with CGA and good safety to promote IND/return to PLOF.  OT Frequency: Min 1X/week    Co-evaluation PT/OT/SLP Co-Evaluation/Treatment: Yes Reason for Co-Treatment: Necessary to address cognition/behavior during functional activity;For patient/therapist safety;To address functional/ADL transfers          AM-PAC OT "6 Clicks" Daily Activity     Outcome Measure Help from another person eating meals?: A Little Help from another person taking care of  personal grooming?: A Little Help from another person toileting, which includes using toliet, bedpan, or urinal?: A Lot Help from another person bathing (including washing, rinsing, drying)?: A Lot Help from another person to put on and taking off regular upper body clothing?: A Lot Help from another person to put on and taking off regular lower body clothing?: A Lot 6 Click Score: 14   End of Session Equipment Utilized During Treatment: Oxygen Nurse Communication: Mobility status  Activity Tolerance: Patient tolerated treatment well;Patient limited by fatigue;Patient limited by lethargy Patient left: in bed;with call bell/phone within reach;with family/visitor present  OT Visit Diagnosis: Other abnormalities of gait and mobility (R26.89);Repeated falls (R29.6);Muscle weakness (generalized) (M62.81);History of falling (Z91.81)                Time: 1413-1430 OT Time Calculation (min): 17 min Charges:  OT General Charges $OT Visit: 1 Visit OT Evaluation $OT Eval Low Complexity: 1 Low  Zamora Colton, OTR/L 05/21/23, 2:50 PM  Maxfield Gildersleeve E Mignon Bechler 05/21/2023, 2:46 PM

## 2023-05-22 DIAGNOSIS — A419 Sepsis, unspecified organism: Secondary | ICD-10-CM | POA: Diagnosis not present

## 2023-05-22 DIAGNOSIS — N179 Acute kidney failure, unspecified: Secondary | ICD-10-CM | POA: Diagnosis not present

## 2023-05-22 DIAGNOSIS — K7460 Unspecified cirrhosis of liver: Secondary | ICD-10-CM | POA: Diagnosis not present

## 2023-05-22 DIAGNOSIS — R6521 Severe sepsis with septic shock: Secondary | ICD-10-CM

## 2023-05-22 DIAGNOSIS — E43 Unspecified severe protein-calorie malnutrition: Secondary | ICD-10-CM | POA: Diagnosis not present

## 2023-05-22 DIAGNOSIS — I214 Non-ST elevation (NSTEMI) myocardial infarction: Secondary | ICD-10-CM | POA: Diagnosis not present

## 2023-05-22 LAB — BASIC METABOLIC PANEL
Anion gap: 10 (ref 5–15)
BUN: 34 mg/dL — ABNORMAL HIGH (ref 8–23)
CO2: 20 mmol/L — ABNORMAL LOW (ref 22–32)
Calcium: 7.5 mg/dL — ABNORMAL LOW (ref 8.9–10.3)
Chloride: 106 mmol/L (ref 98–111)
Creatinine, Ser: 1.05 mg/dL — ABNORMAL HIGH (ref 0.44–1.00)
GFR, Estimated: 56 mL/min — ABNORMAL LOW (ref >60)
Glucose, Bld: 80 mg/dL (ref 70–99)
Potassium: 4 mmol/L (ref 3.5–5.1)
Sodium: 136 mmol/L (ref 135–145)

## 2023-05-22 LAB — GLUCOSE, CAPILLARY
Glucose-Capillary: 80 mg/dL (ref 70–99)
Glucose-Capillary: 145 mg/dL — ABNORMAL HIGH (ref 70–99)
Glucose-Capillary: 139 mg/dL — ABNORMAL HIGH (ref 70–99)
Glucose-Capillary: 123 mg/dL — ABNORMAL HIGH (ref 70–99)

## 2023-05-22 LAB — CBC
HCT: 29.1 % — ABNORMAL LOW (ref 36.0–46.0)
Hemoglobin: 9.8 g/dL — ABNORMAL LOW (ref 12.0–15.0)
MCH: 29.7 pg (ref 26.0–34.0)
MCHC: 33.7 g/dL (ref 30.0–36.0)
MCV: 88.2 fL (ref 80.0–100.0)
Platelets: 70 10*3/uL — ABNORMAL LOW (ref 150–400)
RBC: 3.3 MIL/uL — ABNORMAL LOW (ref 3.87–5.11)
RDW: 16 % — ABNORMAL HIGH (ref 11.5–15.5)
WBC: 8.1 10*3/uL (ref 4.0–10.5)
nRBC: 0 % (ref 0.0–0.2)

## 2023-05-22 LAB — PHOSPHORUS: Phosphorus: 2 mg/dL — ABNORMAL LOW (ref 2.5–4.6)

## 2023-05-22 LAB — MAGNESIUM: Magnesium: 2.5 mg/dL — ABNORMAL HIGH (ref 1.7–2.4)

## 2023-05-22 MED ORDER — K PHOS MONO-SOD PHOS DI & MONO 155-852-130 MG PO TABS
500.0000 mg | ORAL_TABLET | Freq: Once | ORAL | Status: AC
  Administered 2023-05-22: 500 mg via ORAL
  Filled 2023-05-22: qty 2

## 2023-05-22 MED ORDER — METOPROLOL SUCCINATE ER 25 MG PO TB24
12.5000 mg | ORAL_TABLET | Freq: Every day | ORAL | Status: DC
  Administered 2023-05-22 – 2023-05-26 (×5): 12.5 mg via ORAL
  Filled 2023-05-22 (×5): qty 1

## 2023-05-22 MED ORDER — LOPERAMIDE HCL 2 MG PO CAPS
2.0000 mg | ORAL_CAPSULE | Freq: Three times a day (TID) | ORAL | Status: AC
  Administered 2023-05-22 – 2023-05-23 (×3): 2 mg via ORAL
  Filled 2023-05-22 (×3): qty 1

## 2023-05-22 NOTE — Progress Notes (Signed)
Patient transferred to unit, arrived in bed with spouse at bedside.  Patient and spouse oriented to room and unit with reported understanding.  Patient denies pain.

## 2023-05-22 NOTE — [place] FL2 (Signed)
[place] MEDICAID FL2 LEVEL OF CARE FORM     IDENTIFICATION  Patient Name: Jenny [name] Birthdate: Oct 03, 1945 Sex: female Admission Date (Current Location): 05/19/2023  [facility] and [place] Number:  Chiropodist and Address:         Provider Number: [id number]  Attending Physician Name and Address:  Darlin Priestly, MD  Relative Name and Phone Number:       Current Level of Care: Hospital Recommended Level of Care: Skilled Nursing Facility Prior Approval Number:    Date Approved/Denied:   PASRR Number: [id number]  Discharge Plan: SNF    Current Diagnoses: Patient Active Problem List   Diagnosis Date Noted   Protein-calorie malnutrition, severe 05/22/2023   Non-STEMI (non-ST elevated myocardial infarction) (HCC) 05/21/2023   Mitral valve stenosis 05/21/2023   Cirrhosis of liver with ascites (HCC) 05/21/2023   Sepsis with acute organ dysfunction and septic shock (HCC) 05/19/2023   Polyclonal gammopathy 12/10/2021   Rheumatoid arthritis (HCC) 11/20/2020   Rotator cuff arthropathy of left shoulder 08/08/2020   Memory loss 06/18/2020   Axonal neuropathy 06/18/2020   Thrombocytopenia (HCC) 06/18/2020   Ataxia 04/25/2020   Essential hypertension 04/25/2020   Annual physical exam 03/02/2020   Irritable bowel syndrome    Hypothyroidism    Osteoporosis    Anxiety 01/17/2020   Tibia/fibula fracture, left, closed, initial encounter 05/19/2018   S/P AVR (aortic valve replacement) 02/17/2017   Type 2 diabetes mellitus with complication, without long-term current use of insulin (HCC) 01/29/2017   Mood disorder (HCC) 01/29/2017   Aortic valve stenosis 01/22/2017   Chronic systolic CHF (congestive heart failure) (HCC) 01/22/2017   Anemia 01/16/2017   Acute CHF (congestive heart failure) (HCC) 01/16/2017   Elevated troponin 01/16/2017   Hypokalemia 01/16/2017   Other forms of angina pectoris (HCC) 01/15/2017    Orientation RESPIRATION BLADDER Height & Weight      Self, Place  O2 (1L [place]) Incontinent Weight: 45.3 kg Height:  4\' 8"  (142.2 cm)  BEHAVIORAL SYMPTOMS/MOOD NEUROLOGICAL BOWEL NUTRITION STATUS      Incontinent (Rectal tube. to be removed prior to discharge) Diet (DYS 2)  AMBULATORY STATUS COMMUNICATION OF NEEDS Skin   Total Care Verbally Skin abrasions                       Personal Care Assistance Level of Assistance              Functional Limitations Info             SPECIAL CARE FACTORS FREQUENCY                       Contractures Contractures Info: Not present    Additional Factors Info  Code Status, Allergies Code Status Info: full Allergies Info: Iodine, Shellfish Allergy           Current Medications (05/22/2023):  This is the current hospital active medication list Current Facility-Administered Medications  Medication Dose Route Frequency Provider Last Rate Last Admin   acetaminophen (TYLENOL) tablet 650 mg  650 mg Oral Q4H PRN Ezequiel Essex, NP       ALPRAZolam Prudy Feeler) tablet 0.5 mg  0.5 mg Oral BID PRN Vida Rigger, MD   0.5 mg at 05/21/23 2125   aspirin chewable tablet 81 mg  81 mg Oral Daily End, Christopher, MD   81 mg at 05/22/23 0933   azithromycin (ZITHROMAX) tablet 500 mg  500 mg Oral q1800 Chappell,  [place] Mascot, RPH   500 mg at 05/21/23 1735   cefTRIAXone (ROCEPHIN) 2 g in sodium chloride 0.9 % 100 mL IVPB  2 g Intravenous Q24H Willy Eddy, MD   Stopped at 05/21/23 1808   Chlorhexidine Gluconate Cloth 2 % PADS 6 each  6 each Topical Daily Vida Rigger, MD   6 each at 05/22/23 0944   docusate sodium (COLACE) capsule 100 mg  100 mg Oral BID PRN Ezequiel Essex, NP       donepezil (ARICEPT) tablet 10 mg  10 mg Oral QHS Ezequiel Essex, NP   10 mg at 05/21/23 2124   guaiFENesin (ROBITUSSIN) 100 MG/5ML liquid 5 mL  5 mL Oral Q4H PRN Ezequiel Essex, NP   5 mL at 05/20/23 0021   heparin injection 5,000 Units  5,000 Units Subcutaneous Q12H Vida Rigger, MD   5,000 Units at 05/22/23  0933   insulin aspart (novoLOG) injection 0-5 Units  0-5 Units Subcutaneous QHS Ezequiel Essex, NP       insulin aspart (novoLOG) injection 0-9 Units  0-9 Units Subcutaneous TID WC Ezequiel Essex, NP   1 Units at 05/22/23 1251   ipratropium-albuterol (DUONEB) 0.5-2.5 (3) MG/3ML nebulizer solution 3 mL  3 mL Nebulization Q6H PRN Ezequiel Essex, NP       lactose free nutrition (BOOST PLUS) liquid 237 mL  237 mL Oral TID WC Vida Rigger, MD   237 mL at 05/22/23 0933   levothyroxine (SYNTHROID) tablet 88 mcg  88 mcg Oral Daily Ezequiel Essex, NP   88 mcg at 05/22/23 0555   loperamide (IMODIUM) capsule 2 mg  2 mg Oral TID Darlin Priestly, MD       metoprolol succinate (TOPROL-XL) 24 hr tablet 12.5 mg  12.5 mg Oral Daily Gollan, Tollie Pizza, MD       multivitamin with minerals tablet 1 tablet  1 tablet Oral Q lunch Vida Rigger, MD       ondansetron (ZOFRAN) injection 4 mg  4 mg Intravenous Q6H PRN Ezequiel Essex, NP       Oral care mouth rinse  15 mL Mouth Rinse PRN Vida Rigger, MD       polyethylene glycol (MIRALAX / GLYCOLAX) packet 17 g  17 g Oral Daily PRN Ezequiel Essex, NP       sertraline (ZOLOFT) tablet 100 mg  100 mg Oral QHS Ezequiel Essex, NP   100 mg at 05/21/23 2124   thiamine (VITAMIN B1) tablet 100 mg  100 mg Oral Daily Tressie Ellis, RPH   100 mg at 05/22/23 1610     Discharge Medications: Please see discharge summary for a list of discharge medications.  Relevant Imaging Results:  Relevant Lab Results:   Additional Information ss [id number]  Chapman Fitch, RN

## 2023-05-22 NOTE — Progress Notes (Signed)
PT Cancellation Note  Patient Details Name: Jenny [name] MRN: [id number] DOB: [birth year]   Cancelled Treatment:    Reason Eval/Treat Not Completed: Patient at procedure or test/unavailable Patient with nursing in room getting her bathed. Will return later as time allows.   Jamy Whyte 05/22/2023, 10:38 AM

## 2023-05-22 NOTE — Progress Notes (Signed)
Physical Therapy Treatment Patient Details Name: Jenny [name] MRN: [id number] DOB: Oct 14, 1945 Today's Date: 05/22/2023   History of Present Illness 77 y.o. female presented to [facility] ER on 11/18 via EMS with c/o "not feeling well", nausea, and vomiting. MD assessment: elevated troponin and valvular heart dzs in the setting of sepsis and AKI.  PMHx: CAD, severe AS s/p bioprosthetic AVR, HFpEF, DMII, HL, TIA, stroke, iron def anemia, COPD, anxiety, and hypothyroidism.    PT Comments  Patient received in bed, nursing staff present and just cleaned patient up and placed flexiseal. Patient is more alert initially, but quickly began to fall asleep during session. She is too lethargic for significant mobility and declines attempting to sit edge of bed. She does agree to strengthening exercises. Patient requires constant verbal cues to remain awake and on task. Required assist for all exercises due to fatigue and weakness. Patient will continue to benefit from skilled PT to improve strength and functional independence.     If plan is discharge home, recommend the following: Two people to help with walking and/or transfers;A lot of help with bathing/dressing/bathroom   Can travel by private vehicle     No  Equipment Recommendations  None recommended by PT;Other (comment) (TBD)    Recommendations for Other Services       Precautions / Restrictions Precautions Precautions: Fall Restrictions Weight Bearing Restrictions: No     Mobility  Bed Mobility               General bed mobility comments: Patient very lethargic, unable to participate in sitting up on side of bed. Just finished getting cleaned up and having flexiseal put in.    Transfers                   General transfer comment: unable    Ambulation/Gait               General Gait Details: unable due to lethargy   Stairs             Wheelchair Mobility     Tilt Bed    Modified Rankin (Stroke  Patients Only)       Balance                                            Cognition Arousal: Lethargic Behavior During Therapy: WFL for tasks assessed/performed Overall Cognitive Status: Within Functional Limits for tasks assessed                                 General Comments: awakens/opens eyes when asked, but otherwise lethargic; oriented x3        Exercises Other Exercises Other Exercises: BLE strengthening exercises with assist due to patient falling asleep intermittently. AP, Heel slides, hip abd/add, SLR x 5-10 as able. B shoulder flexion with assist x 5    General Comments        Pertinent Vitals/Pain Pain Assessment Pain Assessment: No/denies pain Pain Intervention(s): Monitored during session    Home Living                          Prior Function            PT Goals (current goals can now be found in the care  plan section) Acute Rehab PT Goals Patient Stated Goal: to improve PT Goal Formulation: With family Time For Goal Achievement: 06/04/23 Potential to Achieve Goals: Fair Progress towards PT goals: Not progressing toward goals - comment (continues to be too lethargic to progress)    Frequency    Min 1X/week      PT Plan      Co-evaluation              AM-PAC PT "6 Clicks" Mobility   Outcome Measure  Help needed turning from your back to your side while in a flat bed without using bedrails?: Total Help needed moving from lying on your back to sitting on the side of a flat bed without using bedrails?: Total Help needed moving to and from a bed to a chair (including a wheelchair)?: Total Help needed standing up from a chair using your arms (e.g., wheelchair or bedside chair)?: Total Help needed to walk in hospital room?: Total Help needed climbing 3-5 steps with a railing? : Total 6 Click Score: 6    End of Session Equipment Utilized During Treatment: Oxygen Activity Tolerance: Patient  limited by lethargy;Patient limited by fatigue Patient left: in bed;with bed alarm set Nurse Communication: Mobility status PT Visit Diagnosis: Muscle weakness (generalized) (M62.81);Other abnormalities of gait and mobility (R26.89)     Time: 1610-9604 PT Time Calculation (min) (ACUTE ONLY): 13 min  Charges:    $Therapeutic Exercise: 8-22 mins PT General Charges $$ ACUTE PT VISIT: 1 Visit                     Eleuterio Dollar, PT, GCS 05/22/23,12:08 PM

## 2023-05-22 NOTE — Progress Notes (Signed)
PROGRESS NOTE    Jenny [name]  JOA:[id number] DOB: [birth year] DOA: 05/19/2023 PCP: Corky Downs, MD  111A/111A-AA  LOS: 2 days   Brief hospital course:   Assessment & Plan: 77 yo female who presented to [facility] ER on 11/18 via EMS with c/o "not feeling well", nausea, and vomiting. EMS reported when they arrived at pts home she was lethargic and diaphoretic with generalized weakness. She received iv fluids en route to the ER. Pts husband reports the pt has had a cough over the past 2 weeks, however her cough worsened today and she vomited bile prompting EMS notification.   Upon arrival to the ER pt hypotensive and tachycardic meeting sepsis criteria. She received 2L NS bolus/azithromycin/ceftriaxone, however she remained hypotensive requiring levophed gtt. CXR concerning for pneumonia.  PCCM team contacted for ICU admission.   Pt was transferred to hospitalist service on 05/22/23.  #Acute respiratory failure secondary to pneumonia  --currently on 1L [place] --Continue supplemental O2 to keep sats >=92%, wean as tolerated --cont ceftriaxone and azithromycin  #Septic shock- PRESENT ON ADMISSION - DUE TO PNEUMONIA --hypotensive with lactic acidosis on arrival  --off pressor morning of 11/20.  NSTEMI --trop peaked at 848.  In the setting of respiratory distress, pneumonia, sepsis. --cardio consulted --Normal ejection fraction on echo  --start Toprol 12.5 mg daily -Consider ischemic workup following recovery, could be arranged as outpatient   Aortic valve stenosis status post bioprosthetic aortic valve  --no issue  #Chronic systolic CHF, ruled out  Hx: CAD, HLD, Stroke --cont ASA --resume statin after discharge   #Mild acute kidney injury secondary ATN  --Cr peaked at 1.27.    #Metabolic acidosis  #Lactic acidosis   #Hypokalemia  - monitor and supplement PRN   #Transaminitis - monitor   # Hx of Iron deficiency anemia  #Acute on chronic thrombocytopenia  - Trend CBC   - Monitor for s/sx of bleeding  - Transfuse for hgb <7 - Avoid chemical VTE px    #Type II diabetes mellitus  - Hemoglobin A1c only 5.3  --ACHS and SSI for now  # IBS with diarrhea --pt reported at baseline, she takes Imodium for her diarrhea. --multiple episodes of diarrhea noted today, C diff neg.  Rectal tube inserted. --Imodium 2 mg TID for 1 day.   DVT prophylaxis: Heparin SQ Code Status: Full code  Family Communication: granddaughter and grandson updated at bedside today Level of care: Med-Surg Dispo:   The patient is from: home Anticipated d/c is to: SNF rehab Anticipated d/c date is: 2-3 days   Subjective and Interval History:  Pt reported no dyspnea, but some cough.  RN reported many episodes of diarrhea and skin irritation, and asked for rectal tube.   Objective: Vitals:   05/22/23 0500 05/22/23 0800 05/22/23 1200 05/22/23 1340  BP:  (!) 144/63 132/61 133/61  Pulse:  84 94 88  Resp:  (!) 27 (!) 22 16  Temp:    98.5 F (36.9 C)  TempSrc:      SpO2:  99% 97% 97%  Weight: 45.3 kg     Height:        Intake/Output Summary (Last 24 hours) at 05/22/2023 1707 Last data filed at 05/22/2023 1000 Gross per 24 hour  Intake 634.8 ml  Output --  Net 634.8 ml   Filed Weights   05/20/23 0500 05/21/23 0500 05/22/23 0500  Weight: 45.5 kg 45 kg 45.3 kg    Examination:   Constitutional: NAD, AAOx3 HEENT: conjunctivae and lids  normal, EOMI CV: No cyanosis.   RESP: normal respiratory effort, on 1L Neuro: II - XII grossly intact.   Psych: Normal mood and affect.     Data Reviewed: I have personally reviewed labs and imaging studies  Time spent: 50 minutes  Darlin Priestly, MD [facility] If 7PM-7AM, please contact night-coverage 05/22/2023, 5:07 PM

## 2023-05-22 NOTE — Plan of Care (Signed)
  Problem: Education: Goal: Individualized Educational Video(s) Outcome: Progressing   Problem: Coping: Goal: Ability to adjust to condition or change in health will improve Outcome: Progressing   Problem: Fluid Volume: Goal: Ability to maintain a balanced intake and output will improve Outcome: Progressing   Problem: Metabolic: Goal: Ability to maintain appropriate glucose levels will improve Outcome: Progressing   Problem: Nutritional: Goal: Progress toward achieving an optimal weight will improve Outcome: Progressing   Problem: Tissue Perfusion: Goal: Adequacy of tissue perfusion will improve Outcome: Progressing   Problem: Clinical Measurements: Goal: Ability to maintain clinical measurements within normal limits will improve Outcome: Progressing Goal: Will remain free from infection Outcome: Progressing Goal: Diagnostic test results will improve Outcome: Progressing Goal: Respiratory complications will improve Outcome: Progressing Goal: Cardiovascular complication will be avoided Outcome: Progressing   Problem: Activity: Goal: Risk for activity intolerance will decrease Outcome: Progressing   Problem: Coping: Goal: Level of anxiety will decrease Outcome: Progressing   Problem: Elimination: Goal: Will not experience complications related to urinary retention Outcome: Progressing   Problem: Pain Management: Goal: General experience of comfort will improve Outcome: Progressing   Problem: Safety: Goal: Ability to remain free from injury will improve Outcome: Progressing   Problem: Education: Goal: Ability to describe self-care measures that may prevent or decrease complications (Diabetes Survival Skills Education) will improve Outcome: Not Progressing   Problem: Health Behavior/Discharge Planning: Goal: Ability to identify and utilize available resources and services will improve Outcome: Not Progressing Goal: Ability to manage health-related needs will  improve Outcome: Not Progressing   Problem: Nutritional: Goal: Maintenance of adequate nutrition will improve Outcome: Not Progressing   Problem: Skin Integrity: Goal: Risk for impaired skin integrity will decrease Outcome: Not Progressing   Problem: Education: Goal: Knowledge of General Education information will improve Description: Including pain rating scale, medication(s)/side effects and non-pharmacologic comfort measures Outcome: Not Progressing   Problem: Health Behavior/Discharge Planning: Goal: Ability to manage health-related needs will improve Outcome: Not Progressing   Problem: Nutrition: Goal: Adequate nutrition will be maintained Outcome: Not Progressing   Problem: Elimination: Goal: Will not experience complications related to bowel motility Outcome: Not Progressing   Problem: Skin Integrity: Goal: Risk for impaired skin integrity will decrease Outcome: Not Progressing

## 2023-05-22 NOTE — TOC Initial Note (Signed)
Transition of Care [facility]) - Initial/Assessment Note    Patient Details  Name: Jenny [name] MRN: [id number] Date of Birth: Apr 27, 1946  Transition of [facility] CM/SW Contact:    Chapman Fitch, RN Phone Number: 05/22/2023, 2:56 PM  Clinical Narrative:                  Patient noted to be lethargic with intermittent confusion Assessment completed via phone with husband Jenny [name]  Admitted for: sepsis Admitted from: home with husband PCP: masoud Current home health/prior home health/DME: rollator, and shower seat  Therapy recommending SNF Husband in agreement for bed search.  He is hopeful that patient will improve and return home with home health  Existing pasrr Fl2 sent for signature Bed search initiated    Expected Discharge Plan: Skilled Nursing Facility     Patient Goals and CMS Choice            Expected Discharge Plan and Services                                              Prior Living Arrangements/Services                       Activities of Daily Living      Permission Sought/Granted                  Emotional Assessment              Admission diagnosis:  Septic shock (HCC) [A41.9, R65.21] Sepsis with acute organ dysfunction and septic shock, due to unspecified organism, unspecified organ dysfunction type (HCC) [A41.9, R65.21] Patient Active Problem List   Diagnosis Date Noted   Protein-calorie malnutrition, severe 05/22/2023   Non-STEMI (non-ST elevated myocardial infarction) (HCC) 05/21/2023   Mitral valve stenosis 05/21/2023   Cirrhosis of liver with ascites (HCC) 05/21/2023   Sepsis with acute organ dysfunction and septic shock (HCC) 05/19/2023   Polyclonal gammopathy 12/10/2021   Rheumatoid arthritis (HCC) 11/20/2020   Rotator cuff arthropathy of left shoulder 08/08/2020   Memory loss 06/18/2020   Axonal neuropathy 06/18/2020   Thrombocytopenia (HCC) 06/18/2020   Ataxia 04/25/2020   Essential  hypertension 04/25/2020   Annual physical exam 03/02/2020   Irritable bowel syndrome    Hypothyroidism    Osteoporosis    Anxiety 01/17/2020   Tibia/fibula fracture, left, closed, initial encounter 05/19/2018   S/P AVR (aortic valve replacement) 02/17/2017   Type 2 diabetes mellitus with complication, without long-term current use of insulin (HCC) 01/29/2017   Mood disorder (HCC) 01/29/2017   Aortic valve stenosis 01/22/2017   Chronic systolic CHF (congestive heart failure) (HCC) 01/22/2017   Anemia 01/16/2017   Acute CHF (congestive heart failure) (HCC) 01/16/2017   Elevated troponin 01/16/2017   Hypokalemia 01/16/2017   Other forms of angina pectoris (HCC) 01/15/2017   PCP:  Corky Downs, MD Pharmacy:   [facility] [phone] [place], [place] - [address] AT [facility] [address] [place] [address] Phone: [phone] Fax: [phone]  [facility] #[id number] [place]ree Ditto, [place] - [address] AT [facility] OF [address] [place] [address] [place] [address] Phone: [phone] Fax: [phone]     Social Determinants of Health (SDOH) Social History: SDOH Screenings   Food Insecurity: No Food Insecurity (05/20/2023)  Housing: Patient Declined (05/20/2023)  Transportation Needs: No Transportation Needs (05/20/2023)  Utilities: Not At Risk (05/20/2023)  Alcohol Screen: Low Risk  (05/18/2021)  Depression (PHQ2-9): Low Risk  (12/11/2021)  Financial Resource Strain: Low Risk  (05/18/2021)  Physical Activity: Insufficiently Active (05/18/2021)  Social Connections: Socially Integrated (05/18/2021)  Stress: No Stress Concern Present (05/18/2021)  Tobacco Use: Medium Risk (05/19/2023)   SDOH Interventions:     Readmission Risk Interventions     No data to display

## 2023-05-22 NOTE — Progress Notes (Signed)
PHARMACY CONSULT NOTE  Pharmacy Consult for Electrolyte Monitoring and Replacement   Recent Labs: Potassium (mmol/L)  Date Value  05/22/2023 4.0  10/24/2014 4.4   Magnesium (mg/dL)  Date Value  29/52/8413 2.5 (H)   Calcium (mg/dL)  Date Value  24/40/1027 7.5 (L)   Calcium, Total (mg/dL)  Date Value  25/36/6440 8.6 (L)   Albumin (g/dL)  Date Value  34/74/2595 2.4 (L)  05/23/2020 3.9  03/02/2012 3.2 (L)   Phosphorus (mg/dL)  Date Value  63/87/5643 2.0 (L)   Sodium (mmol/L)  Date Value  05/22/2023 136  05/23/2020 138  10/24/2014 134 (L)   Assessment: 77 y.o. female HTZ past medical history chronic kidney pain and neuropsychiatric disorder presents to the ER for "not feeling well nausea vomiting starting this morning. Pharmacy is asked to follow and replace electrolytes while in CCU.  Diet: Dysphagia 2. Aspiration precautions  Goal of Therapy:  Electrolytes WNL  Plan:  --Phos 2, K Phos Neutral 500 mg PO x 1 dose --Patient care transferred from PCCM to [facility]. Will discontinue electrolyte consult at this time. Defer further ordering of labs and electrolyte replacement to primary team  Jenny [name] 05/22/2023 8:24 AM

## 2023-05-22 NOTE — Progress Notes (Signed)
Attending Note Patient seen and examined, agree with detailed note above,   Patient presentation and plan discussed on rounds.      EKG lab work, chest x-ray, echocardiogram reviewed independently by myself  No complaints this morning, reports that she feels better Denies significant chest pain Continued chronic cough  Reports that she is hungry, difficulty feeding herself Discussed her echocardiogram report with her  On examination : alert oriented, no JVD, lungs clear to auscultation bilaterally, heart sounds regular normal S1-S2 no murmurs appreciated, abdomen soft nontender no significant lower extremity edema.  Musculoskeletal exam with good range of motion, neurologic exam grossly nonfocal     Latest Ref Rng & Units 05/22/2023    5:23 AM 05/21/2023    3:08 AM 05/20/2023    2:13 AM  CBC  WBC 4.0 - 10.5 K/uL 8.1  12.3  14.8   Hemoglobin 12.0 - 15.0 g/dL 9.8  9.6  47.8   Hematocrit 36.0 - 46.0 % 29.1  28.5  30.5   Platelets 150 - 400 K/uL 70  70  85       Latest Ref Rng & Units 05/22/2023    5:23 AM 05/21/2023    3:08 AM 05/20/2023    1:09 PM  BMP  Glucose 70 - 99 mg/dL 80  95  88   BUN 8 - 23 mg/dL 34  32  27   Creatinine 0.44 - 1.00 mg/dL 2.95  6.21  3.08   Sodium 135 - 145 mmol/L 136  136  133   Potassium 3.5 - 5.1 mmol/L 4.0  4.6  5.0   Chloride 98 - 111 mmol/L 106  110  106   CO2 22 - 32 mmol/L 20  21  20    Calcium 8.9 - 10.3 mg/dL 7.5  7.4  7.4    A/P: Acute respiratory distress/pneumonia/sepsis Worsening cough over several weeks, shortness of breath, hypotensive with lactic acidosis on arrival Chest x-ray suggestive of pneumonia On antibiotics, Off pressors, blood pressure stable, cough slowly improving Blood pressure much improved   Non-STEMI In the setting of respiratory distress, pneumonia, sepsis Peak troponin 848 In the setting of thrombocytopenia, and anemia, Not on heparin infusion On DVT prophylaxis heparin with aspirin Initially not started on  beta-blocker in the setting of hypotension/sepsis Blood pressure has since improved, will start low-dose beta-blocker metoprolol succinate 12.5 daily Normal ejection fraction on echo -Consider ischemic workup following recovery, could be arranged as outpatient   Aortic valve stenosis, status post bioprosthetic aortic valve Appropriate gradient on echo 15 mmHg   Mitral valve disease Elevated gradient concerning for stenosis Following recovery, outpatient evaluation could consider additional imaging if clinically indicated.  Not a candidate for advanced therapies if mitral valve disease progresses   Transaminitis INR 1.5, right upper quadrant ultrasound suggestive of cirrhosis She denies alcohol   [facility] will sign off.   Medication Recommendations: Will start metoprolol succinate as above Other recommendations (labs, testing, etc): No further inpatient testing Follow up as an outpatient: Outpatient follow-up in cardiology clinic    Signed: Dossie Arbour  M.D., Ph.D. [facility]

## 2023-05-23 DIAGNOSIS — A419 Sepsis, unspecified organism: Secondary | ICD-10-CM | POA: Diagnosis not present

## 2023-05-23 DIAGNOSIS — N179 Acute kidney failure, unspecified: Secondary | ICD-10-CM | POA: Diagnosis not present

## 2023-05-23 DIAGNOSIS — R6521 Severe sepsis with septic shock: Secondary | ICD-10-CM | POA: Diagnosis not present

## 2023-05-23 LAB — CBC
HCT: 31.1 % — ABNORMAL LOW (ref 36.0–46.0)
Hemoglobin: 10.6 g/dL — ABNORMAL LOW (ref 12.0–15.0)
MCH: 29.4 pg (ref 26.0–34.0)
MCHC: 34.1 g/dL (ref 30.0–36.0)
MCV: 86.1 fL (ref 80.0–100.0)
Platelets: 79 10*3/uL — ABNORMAL LOW (ref 150–400)
RBC: 3.61 MIL/uL — ABNORMAL LOW (ref 3.87–5.11)
RDW: 15.8 % — ABNORMAL HIGH (ref 11.5–15.5)
WBC: 7.4 10*3/uL (ref 4.0–10.5)
nRBC: 0 % (ref 0.0–0.2)

## 2023-05-23 LAB — GLUCOSE, CAPILLARY
Glucose-Capillary: 99 mg/dL (ref 70–99)
Glucose-Capillary: 172 mg/dL — ABNORMAL HIGH (ref 70–99)
Glucose-Capillary: 127 mg/dL — ABNORMAL HIGH (ref 70–99)
Glucose-Capillary: 165 mg/dL — ABNORMAL HIGH (ref 70–99)

## 2023-05-23 LAB — LEGIONELLA PNEUMOPHILA SEROGP 1 UR AG: L. pneumophila Serogp 1 Ur Ag: NEGATIVE

## 2023-05-23 LAB — COMPREHENSIVE METABOLIC PANEL
ALT: 112 U/L — ABNORMAL HIGH (ref 0–44)
AST: 82 U/L — ABNORMAL HIGH (ref 15–41)
Albumin: 2.3 g/dL — ABNORMAL LOW (ref 3.5–5.0)
Alkaline Phosphatase: 131 U/L — ABNORMAL HIGH (ref 38–126)
Anion gap: 9 (ref 5–15)
BUN: 26 mg/dL — ABNORMAL HIGH (ref 8–23)
CO2: 20 mmol/L — ABNORMAL LOW (ref 22–32)
Calcium: 7.7 mg/dL — ABNORMAL LOW (ref 8.9–10.3)
Chloride: 107 mmol/L (ref 98–111)
Creatinine, Ser: 0.87 mg/dL (ref 0.44–1.00)
GFR, Estimated: >60 mL/min (ref >60)
Glucose, Bld: 113 mg/dL — ABNORMAL HIGH (ref 70–99)
Potassium: 3.5 mmol/L (ref 3.5–5.1)
Sodium: 136 mmol/L (ref 135–145)
Total Bilirubin: 1.1 mg/dL (ref <1.2)
Total Protein: 5.9 g/dL — ABNORMAL LOW (ref 6.5–8.1)

## 2023-05-23 LAB — PHOSPHORUS: Phosphorus: 1.5 mg/dL — ABNORMAL LOW (ref 2.5–4.6)

## 2023-05-23 MED ORDER — BOOST / RESOURCE BREEZE PO LIQD CUSTOM
1.0000 | Freq: Three times a day (TID) | ORAL | Status: DC
  Administered 2023-05-23 – 2023-05-27 (×8): 1 via ORAL

## 2023-05-23 MED ORDER — LOPERAMIDE HCL 2 MG PO CAPS
2.0000 mg | ORAL_CAPSULE | Freq: Three times a day (TID) | ORAL | Status: AC
  Administered 2023-05-23 – 2023-05-24 (×3): 2 mg via ORAL
  Filled 2023-05-23 (×3): qty 1

## 2023-05-23 MED ORDER — PROSOURCE PLUS PO LIQD
30.0000 mL | Freq: Three times a day (TID) | ORAL | Status: DC
  Administered 2023-05-23 – 2023-05-27 (×10): 30 mL via ORAL
  Filled 2023-05-23 (×18): qty 30

## 2023-05-23 MED ORDER — FUROSEMIDE 10 MG/ML IJ SOLN
40.0000 mg | Freq: Once | INTRAMUSCULAR | Status: AC
  Administered 2023-05-23: 40 mg via INTRAVENOUS
  Filled 2023-05-23: qty 4

## 2023-05-23 MED ORDER — POTASSIUM PHOSPHATES 15 MMOLE/5ML IV SOLN
30.0000 mmol | Freq: Once | INTRAVENOUS | Status: AC
  Administered 2023-05-23: 30 mmol via INTRAVENOUS
  Filled 2023-05-23: qty 10

## 2023-05-23 NOTE — Progress Notes (Signed)
Patient called out states she can't breath good. O2 sat 92% on 1L bump up to 2L. Reposition. Lung auscultated no wheezing, rales or crackle heard. No c/o of pain. Emotional support given. Issue reported to morning shift.

## 2023-05-23 NOTE — Progress Notes (Signed)
If additional IV access is needed, please consider CVC or PICC line. Patient has limited peripheral access options upon [place] assessment.   Bryndan Bilyk Loyola Mast, RN

## 2023-05-23 NOTE — Progress Notes (Signed)
Speech Language Pathology Treatment: Dysphagia  Patient Details Name: Jenny [name] MRN: [id number] DOB: 1945/09/17 Today's Date: 05/23/2023 Time: 0920-1000 SLP Time Calculation (min) (ACUTE ONLY): 40 min  Assessment / Plan / Recommendation Clinical Impression  Pt seen for ongoing toleration of rec'd oral diet today. Pt awake, verbal. She stated she needed several things "before I can eat anything", including positioning appropriately in bed. Full support given for positioning and tray setup, then pt was able to feed herself. Plastic utensils provided for ease of self-feeding(less heavy).  Pt required min verbal cues for redirection and follow through w/ tasks. No overt congested cough w/ expectoration of Phlegm was noted today. Pt Edentulous. On [place] O2 support 2-4L; afebrile currently. WBC WNL.   Pt appears to present w/ grossly functional oropharyngeal phase swallowing w/ a modified diet/food consistency(Purees/well-Minced softened foods, thins) w/ No overt neuromuscular deficits/dysphagia noted. Pt consumed the po trials w/ No overt clinical s/s of aspiration during po trials. Pt is Edentulous which impacts mastication of solid foods. Pt appears at reduced risk for aspiration/aspiration pneumonia when following general aspiration precautions w/ a modified diet consistency for ease of mashing/gumming foods for swallowing.  Pt continues to require feeding support and encouragement for sitting upright AND eating/drinking.     During po trials of breakfast meal, pt consumed all consistencies w/ No overt coughing, decline in vocal quality, or change in respiratory presentation during/post trials. O2 sats remained 98%. Oral phase appeared [facility] w/ timely bolus management and control of bolus propulsion for A-P transfer for swallowing of liquids and purees. Increased oral phase time needed for mashing/gumming of the Minced foods even though they were moistened well. Expectoration of 2 small pieces of  potatoes noted; oral phase management of scrambled eggs was functional. Oral clearing achieved w/ all trial consistencies given Time, alternating foods/liquids, and moistening the foods.  Pt helped to feed self by holding Cup for drinking and food trials w/ plastic spoon -- this decreases risk for aspiration when drinking.    Recommend continue a MINCED consistency diet w/ well-moistened foods d/t EDENTULOUS status; Thin liquids -- carefully monitor straw use, and pt should Hold Cup when drinking. Recommend general aspiration precautions, reduce distractions during meals, talking. Must sit fully upright for any po's. Support feeding as needed d/t overall weakness and illness. Pills CRUSHED vs WHOLE in Puree for safer, easier swallowing.  Education given on Pills in Puree; food consistencies and easy to eat options; general aspiration precautions to pt and NSG.  Strongly recommend f/u w/ Dietician and possibly try other minced/chopped foods post return home when Family can manage altering the texture of various foods for ease of pt's mashing/gumming; more Purees in her diet offer ease of intake and increased intake.  No further skilled ST services indicated at this time. Any further needs can be followed up w/ by her PCP and at her next venue of care. NSG updated, agreed. MD updated. Recommend Dietician f/u for support.     HPI HPI: Pt is a 77 y.o. female w/ past medical history chronic kidney pain, CAD, CHF, DMII, anxiety/depression, Panic attacks, stroke, IBS, and neuropsychiatric disorder.  She presented to [facility] ER on 11/18 via EMS with c/o "not feeling well", nausea, and vomiting.  EMS reported when they arrived at pts home she was lethargic and diaphoretic with generalized weakness.  She received iv fluids en route to the ER.  Pt's husband reports the pt has had a cough over the past 2 weeks, however her  cough worsened today and she vomited bile prompting EMS notification.  ED Course   Upon arrival to  the ER pt hypotensive and tachycardic meeting sepsis criteria.   Imaging: Worsening aeration, generalized appearance and rapid progression  favoring edema. There is asymmetric left-sided airspace disease and  pneumonia may be coexistent.  2. Background of chronic lung disease and mild fibrosis by prior  chest CT imaging      SLP Plan  All goals met      Recommendations for follow up therapy are one component of a multi-disciplinary discharge planning process, led by the attending physician.  Recommendations may be updated based on patient status, additional functional criteria and insurance authorization.    Recommendations  Diet recommendations: Dysphagia 2 (fine chop);Thin liquid (more purees rec'd as pt is most successful w/ this consistency food) Liquids provided via: Cup;Straw (monitor) Medication Administration: Crushed with puree (vs whole in puree if able) Supervision: Patient able to self feed;Staff to assist with self feeding;Full supervision/cueing for compensatory strategies Compensations: Minimize environmental distractions;Slow rate;Small sips/bites;Lingual sweep for clearance of pocketing;Follow solids with liquid Postural Changes and/or Swallow Maneuvers: Out of bed for meals;Seated upright 90 degrees;Upright 30-60 min after meal (REFLUX Precautions)                 (Dietician f/u; Palliative Care for GOC discussion) Oral care BID;Oral care before and after PO;Staff/trained caregiver to provide oral care;Oral care QID (support)   Frequent or constant Supervision/Assistance Dysphagia, unspecified (R13.10) (Edentulous and weak; Esophageal phase dysmotility suspected(belching+); reduced oral intake at Baseline per history/Husband's report)     All goals met       Jerilynn Som, MS, CCC-SLP Speech Language Pathologist Rehab Services; [facility] [phone] (ascom) Elaria Osias  05/23/2023, 3:21 PM

## 2023-05-23 NOTE — Plan of Care (Signed)
  Problem: Education: Goal: Ability to describe self-care measures that may prevent or decrease complications (Diabetes Survival Skills Education) will improve Outcome: Progressing   Problem: Coping: Goal: Ability to adjust to condition or change in health will improve Outcome: Progressing   Problem: Health Behavior/Discharge Planning: Goal: Ability to identify and utilize available resources and services will improve Outcome: Progressing   Problem: Metabolic: Goal: Ability to maintain appropriate glucose levels will improve Outcome: Progressing   Problem: Nutritional: Goal: Maintenance of adequate nutrition will improve Outcome: Progressing   Problem: Skin Integrity: Goal: Risk for impaired skin integrity will decrease Outcome: Progressing   Problem: Tissue Perfusion: Goal: Adequacy of tissue perfusion will improve Outcome: Progressing   Problem: Health Behavior/Discharge Planning: Goal: Ability to manage health-related needs will improve Outcome: Progressing   Problem: Clinical Measurements: Goal: Ability to maintain clinical measurements within normal limits will improve Outcome: Progressing   Problem: Pain Management: Goal: General experience of comfort will improve Outcome: Progressing   Problem: Safety: Goal: Ability to remain free from injury will improve Outcome: Progressing   Problem: Skin Integrity: Goal: Risk for impaired skin integrity will decrease Outcome: Progressing

## 2023-05-23 NOTE — Progress Notes (Signed)
Occupational Therapy Treatment Patient Details Name: Jenny [name] MRN: [id number] DOB: 06/22/1946 Today's Date: 05/23/2023   History of present illness 77 y.o. female presented to [facility] ER on 11/18 via EMS with c/o "not feeling well", nausea, and vomiting. MD assessment: elevated troponin and valvular heart dzs in the setting of sepsis and AKI.  PMHx: CAD, severe AS s/p bioprosthetic AVR, HFpEF, DMII, HL, TIA, stroke, iron def anemia, COPD, anxiety, and hypothyroidism.   OT comments  Pt is supine in bed on arrival with NT present following bathing and now performing linen changes. Pleasant and agreeable to OT session. She denies pain. Pt performed supine to sit at EOB with Max A and initially needing Max A to maintain seated balance at EOB with assist for positioning/posture and cueing. Progressed to CGA and short periods of SBA with posterior bias and cervical flexion with hands on assist to extend. Pt performed oral care seated at EOB with Min A for set up of items, but pt able to perform toothbrushing with encouragement. Pt on 4L initially, sp02 checked noted to be 85-86%, increased to 5L with little improvement so increased to 6L with sp02 88-89%. Pt fatigued after 8-10 mins of seated activity, returned to supine with Min A for BLE management. Mod A X2 to scoot to [facility] and pillows placed to offload pressure. Sp02 rechecked after a few minutes able to return to 4L with 96% sp02.  Pt returned to bed with all needs in place and will cont to require skilled acute OT services to maximize her safety and IND to return to PLOF.       If plan is discharge home, recommend the following:  A lot of help with walking and/or transfers;Direct supervision/assist for medications management;Supervision due to cognitive status;Direct supervision/assist for financial management;Assistance with cooking/housework;Assist for transportation;Help with stairs or ramp for entrance;A lot of help with  bathing/dressing/bathroom   Equipment Recommendations  Other (comment) (defer)    Recommendations for Other Services      Precautions / Restrictions Restrictions Weight Bearing Restrictions: No       Mobility Bed Mobility Overal bed mobility: Needs Assistance Bed Mobility: Supine to Sit, Sit to Supine     Supine to sit: Used rails, Max assist, HOB elevated Sit to supine: Min assist, Used rails, HOB elevated   General bed mobility comments: Max A to sit at EOB and gain balance initially, periods of SBA with posterior bias and cervical flexion; cueing and positioning assist provided by therapist; able to tolerate ~8-10 mins seated EOB before fatigue    Transfers                   General transfer comment: deferred d/t fatigue and weakness, low 02 sats although unsure of accuracy     Balance Overall balance assessment: Needs assistance, History of Falls Sitting-balance support: Feet unsupported, Single extremity supported Sitting balance-Leahy Scale: Poor Sitting balance - Comments: MOD/Max A for initial seated balance EOB, progressing to CGA/SBA for short periods of time                                   ADL either performed or assessed with clinical judgement   ADL Overall ADL's : Needs assistance/impaired     Grooming: Oral care;Sitting;Minimal assistance Grooming Details (indicate cue type and reason): Min A to place toothpaste on brush and bring cup to mouth for spitting/rinsing mouth, but pt able  to hold toothbrush to brush mouth with increased motivation                                    Extremity/Trunk Assessment         Cervical / Trunk Assessment Cervical / Trunk Assessment: Normal (d/t weakness signifncant cervical flexion needing cueing and assist to extend)    Vision       Perception     Praxis      Cognition Arousal: Alert Behavior During Therapy: [facility] for tasks assessed/performed Overall Cognitive  Status: Within Functional Limits for tasks assessed                                 General Comments: pt more awake today, wanting her husband to come to her room; participating in EOB activity until fatigued        Exercises      Shoulder Instructions       General Comments sp02 on 4L 85% at lowest requiring increase to 6L for sats to improve to 89%; upon return to bed and hands warming able to wean back to 4L with 96% sp02    Pertinent Vitals/ Pain       Pain Assessment Pain Assessment: No/denies pain Pain Intervention(s): Monitored during session  Home Living                                          Prior Functioning/Environment              Frequency  Min 1X/week        Progress Toward Goals  OT Goals(current goals can now be found in the care plan section)  Progress towards OT goals: Progressing toward goals  Acute Rehab OT Goals Patient Stated Goal: improve strength and breathing OT Goal Formulation: With patient Time For Goal Achievement: 06/04/23 Potential to Achieve Goals: Fair  Plan      Co-evaluation                 AM-PAC OT "6 Clicks" Daily Activity     Outcome Measure   Help from another person eating meals?: A Little Help from another person taking care of personal grooming?: A Little Help from another person toileting, which includes using toliet, bedpan, or urinal?: A Lot Help from another person bathing (including washing, rinsing, drying)?: A Lot Help from another person to put on and taking off regular upper body clothing?: A Lot Help from another person to put on and taking off regular lower body clothing?: A Lot 6 Click Score: 14    End of Session Equipment Utilized During Treatment: Oxygen  OT Visit Diagnosis: Other abnormalities of gait and mobility (R26.89);Repeated falls (R29.6);Muscle weakness (generalized) (M62.81);History of falling (Z91.81)   Activity Tolerance Patient tolerated  treatment well;Patient limited by fatigue   Patient Left in bed;with call bell/phone within reach;with bed alarm set   Nurse Communication Mobility status        Time: 3086-5784 OT Time Calculation (min): 33 min  Charges: OT General Charges $OT Visit: 1 Visit OT Treatments $Self Care/Home Management : 8-22 mins $Therapeutic Activity: 8-22 mins  Delfina Schreurs, OTR/L  05/23/23, 12:06 PM   Torina Ey E Maycee Blasco 05/23/2023, 12:00 PM

## 2023-05-23 NOTE — Care Management Important Message (Signed)
Important Message  Patient Details  Name: ADIAH DRAGOTTA MRN: [id number] Date of Birth: 1946/01/16   Important Message Given:  Yes - Medicare IM     Olegario Messier A Kanin Lia 05/23/2023, 10:56 AM

## 2023-05-23 NOTE — Progress Notes (Signed)
PT Cancellation Note  Patient Details Name: Jenny [name] MRN: [id number] DOB: 09/21/1945   Cancelled Treatment:    Reason Eval/Treat Not Completed: Fatigue/lethargy limiting ability to participate;Patient declined, no reason specified. Pt received in bed and did not agree to PT session. Pt reports that they are too tired to participate in any activity and would prefer to resume therapy another time. Will re-attempt Tx session at a later date.  Jenny [name], Jenny [name], Jenny [name]  Jenny [name] 05/23/2023, 2:46 PM

## 2023-05-23 NOTE — Plan of Care (Signed)
  Problem: Education: Goal: Ability to describe self-care measures that may prevent or decrease complications (Diabetes Survival Skills Education) will improve Outcome: Progressing   Problem: Coping: Goal: Ability to adjust to condition or change in health will improve Outcome: Progressing   Problem: Fluid Volume: Goal: Ability to maintain a balanced intake and output will improve Outcome: Progressing   Problem: Metabolic: Goal: Ability to maintain appropriate glucose levels will improve Outcome: Progressing   Problem: Skin Integrity: Goal: Risk for impaired skin integrity will decrease Outcome: Progressing   Problem: Tissue Perfusion: Goal: Adequacy of tissue perfusion will improve Outcome: Progressing   Problem: Activity: Goal: Risk for activity intolerance will decrease Outcome: Progressing   Problem: Nutrition: Goal: Adequate nutrition will be maintained Outcome: Progressing   Problem: Coping: Goal: Level of anxiety will decrease Outcome: Progressing   Problem: Elimination: Goal: Will not experience complications related to bowel motility Outcome: Progressing   Problem: Pain Management: Goal: General experience of comfort will improve Outcome: Progressing   Problem: Safety: Goal: Ability to remain free from injury will improve Outcome: Progressing   Problem: Skin Integrity: Goal: Risk for impaired skin integrity will decrease Outcome: Progressing

## 2023-05-23 NOTE — Progress Notes (Signed)
PROGRESS NOTE    Jenny [name]  LKG:[id number] DOB: 1946-05-15 DOA: 05/19/2023 PCP: Corky Downs, MD  111A/111A-AA  LOS: 3 days   Brief hospital course:   Assessment & Plan: 77 yo female who presented to [facility] ER on 11/18 via EMS with c/o "not feeling well", nausea, and vomiting. EMS reported when they arrived at pts home she was lethargic and diaphoretic with generalized weakness. She received iv fluids en route to the ER. Pts husband reports the pt has had a cough over the past 2 weeks, however her cough worsened today and she vomited bile prompting EMS notification.   Upon arrival to the ER pt hypotensive and tachycardic meeting sepsis criteria. She received 2L NS bolus/azithromycin/ceftriaxone, however she remained hypotensive requiring levophed gtt. CXR concerning for pneumonia.  PCCM team contacted for ICU admission.   Pt was transferred to hospitalist service on 05/22/23.  #Acute respiratory failure secondary to pneumonia  --currently on 1L [place] --Continue supplemental O2 to keep sats >=92%, wean as tolerated --cont ceftriaxone and azithromycin --IV lasix 40 today  #Septic shock- PRESENT ON ADMISSION - DUE TO PNEUMONIA --hypotensive with lactic acidosis on arrival  --off pressor morning of 11/20.  NSTEMI --trop peaked at 848.  In the setting of respiratory distress, pneumonia, sepsis. --cardio consulted --Normal ejection fraction on echo  --cont Toprol 12.5 mg daily (new) -Consider ischemic workup following recovery, could be arranged as outpatient   Aortic valve stenosis status post bioprosthetic aortic valve  --no issue  #Chronic systolic CHF, ruled out  Hx: CAD, HLD, Stroke --cont ASA --resume statin after discharge   #Mild acute kidney injury secondary ATN  --Cr peaked at 1.27.    #Metabolic acidosis  #Lactic acidosis   #Hypokalemia  - monitor and supplement PRN  Hypophos --monitor and supplement PRN   #Transaminitis - monitor   # Hx of Iron  deficiency anemia  #Acute on chronic thrombocytopenia  - Trend CBC  - Monitor for s/sx of bleeding  - Transfuse for hgb <7 - Avoid chemical VTE px    #Type II diabetes mellitus  - Hemoglobin A1c only 5.3  --ACHS and SSI for now  # IBS with diarrhea --pt reported at baseline, she takes Imodium for her diarrhea. --multiple episodes of diarrhea noted today, C diff neg.  Rectal tube inserted on 11/21.  Received Imodium and still having liquid stool --repeat Imodium 2 mg TID for 1 more day   DVT prophylaxis: Heparin SQ Code Status: Full code  Family Communication:  Level of care: Med-Surg Dispo:   The patient is from: home Anticipated d/c is to: SNF rehab Anticipated d/c date is: 2-3 days   Subjective and Interval History:  Early this morning, RN reported pt complained of not being able to breath.  O2 turned up to 4L.    Pt continued to have liquid stool.   Objective: Vitals:   05/23/23 1203 05/23/23 1236 05/23/23 1610 05/23/23 1638  BP: (!) 142/63  (!) 134/59   Pulse: 77  84   Resp: 20     Temp: 97.6 F (36.4 C)  99.2 F (37.3 C)   TempSrc: Oral     SpO2: 90% 91% (!) 88% 91%  Weight:      Height:        Intake/Output Summary (Last 24 hours) at 05/23/2023 1919 Last data filed at 05/23/2023 1300 Gross per 24 hour  Intake 0 ml  Output --  Net 0 ml   Filed Weights   05/20/23 0500  05/21/23 0500 05/22/23 0500  Weight: 45.5 kg 45 kg 45.3 kg    Examination:   Constitutional: NAD, alert HEENT: conjunctivae and lids normal, EOMI CV: No cyanosis.   RESP: normal respiratory effort, on 4L Neuro: II - XII grossly intact.   Rectal tube with liquid stool   Data Reviewed: I have personally reviewed labs and imaging studies  Time spent: 50 minutes  Darlin Priestly, MD [facility] If 7PM-7AM, please contact night-coverage 05/23/2023, 7:19 PM

## 2023-05-23 NOTE — Progress Notes (Signed)
Patient having a hard time adjusting to the floor set up. Frequently asking question and making request. We assisted her to call her husband to calm her down. She want a staff to stay in the room with her. Emotional support provided and anxiety med given.

## 2023-05-23 NOTE — Progress Notes (Signed)
Nutrition Follow-up  DOCUMENTATION CODES:   Severe malnutrition in context of chronic illness  INTERVENTION:   -Continue dysphagia 2 diet -D/c Magic cup TID with meals, each supplement provides 290 kcal and 9 grams of protein  -D/c Boost Plus -Boost Breeze po TID, each supplement provides 250 kcal and 9 grams of protein  -30 ml Prosource Plus TID, each supplement provides 100 kcals and 15 grams protein  NUTRITION DIAGNOSIS:   Severe Malnutrition related to chronic illness as evidenced by severe fat depletion, severe muscle depletion.  Ongoing  GOAL:   Patient will meet greater than or equal to 90% of their needs  Progressing   MONITOR:   PO intake, Supplement acceptance, Labs, Weight trends, Skin, I & O's  REASON FOR ASSESSMENT:   Consult Assessment of nutrition requirement/status  ASSESSMENT:   77 y/o female with h/o severe aortic stenosis, CHF, DM, TIA/stroke, mood disorder, IBS, hypothyroidism, CAD, HLD, HTN, RA and anxiety who is admitted with PNA, sepsis, shock and AKI.  11/20- s/p BSE- dysphagia 2 diet with thin liquids  Reviewed I/O's: +240 ml x 24 hours and +5.1 L since admission  Case discussed with SLP, who reports concern over poor oral intake. Pt is on a dysphagia 2 diet, but mostly chooses pureed foods such as applesauce, mashed potatoes, and grits, but does not want tot downgrade to dysphagia 1 diet.  Pt with poor oral intake at baseline, stating "I've never eaten a full meal since I married my husband". Noted meal completions 15%. Pt consuming mostly bites and sips of meals and does not like the [employer], stating they are "too sweet".   Per MAR, also refusing Boost Plus.   Per SLP, pt husband will be coming in later today and will assist with ordering meals.   No wt loss since admission.   TOC following for discharge needs.   Medications reviewed and include lasix, thiamine, and potassium phosphate.  Labs reviewed:  Phos: 1.5 (on IV  supplementation), Mg: 2.5, CBGS: 80-145 (inpatient orders for glycemic control are 0-5 units insulin aspart daily at bedtime and 0-9 units insulin aspart TID with meals).    Diet Order:   Diet Order             DIET DYS 2 Room service appropriate? Yes with Assist; Fluid consistency: Thin  Diet effective now                   EDUCATION NEEDS:   No education needs have been identified at this time  Skin:  Skin Assessment: Reviewed RN Assessment  Last BM:  05/23/23 (type 6 via rectal tube)  Height:   Ht Readings from Last 1 Encounters:  05/21/23 4\' 8"  (1.422 m)    Weight:   Wt Readings from Last 1 Encounters:  05/22/23 45.3 kg    Ideal Body Weight:  42.2 kg  BMI:  Body mass index is 22.39 kg/m.  Estimated Nutritional Needs:   Kcal:  1350-1550  Protein:  65-80 grams  Fluid:  > 1.3 L    Levada Schilling, RD, LDN, CDCES Registered Dietitian III Certified Diabetes Care and Education Specialist Please refer to [facility] for RD and/or RD on-call/weekend/after hours pager

## 2023-05-23 NOTE — Plan of Care (Addendum)
Patient is alert and oriented X 4. Her saturation was 83% in 2 lit/min oxygen, increased to 4 lit/min saturation was 91%.she has diminished lung sound and got breathing treatment and antibiotics as order.she can take medicine crushed with apple sauce.   Problem: Education: Goal: Ability to describe self-care measures that may prevent or decrease complications (Diabetes Survival Skills Education) will improve Outcome: Progressing Goal: Individualized Educational Video(s) Outcome: Progressing   Problem: Coping: Goal: Ability to adjust to condition or change in health will improve Outcome: Progressing   Problem: Fluid Volume: Goal: Ability to maintain a balanced intake and output will improve Outcome: Progressing   Problem: Health Behavior/Discharge Planning: Goal: Ability to identify and utilize available resources and services will improve Outcome: Progressing Goal: Ability to manage health-related needs will improve Outcome: Progressing   Problem: Metabolic: Goal: Ability to maintain appropriate glucose levels will improve Outcome: Progressing   Problem: Nutritional: Goal: Maintenance of adequate nutrition will improve Outcome: Progressing Goal: Progress toward achieving an optimal weight will improve Outcome: Progressing   Problem: Skin Integrity: Goal: Risk for impaired skin integrity will decrease Outcome: Progressing   Problem: Tissue Perfusion: Goal: Adequacy of tissue perfusion will improve Outcome: Progressing   Problem: Education: Goal: Knowledge of General Education information will improve Description: Including pain rating scale, medication(s)/side effects and non-pharmacologic comfort measures Outcome: Progressing   Problem: Health Behavior/Discharge Planning: Goal: Ability to manage health-related needs will improve Outcome: Progressing   Problem: Clinical Measurements: Goal: Ability to maintain clinical measurements within normal limits will  improve Outcome: Progressing Goal: Will remain free from infection Outcome: Progressing Goal: Diagnostic test results will improve Outcome: Progressing Goal: Respiratory complications will improve Outcome: Progressing Goal: Cardiovascular complication will be avoided Outcome: Progressing   Problem: Activity: Goal: Risk for activity intolerance will decrease Outcome: Progressing   Problem: Nutrition: Goal: Adequate nutrition will be maintained Outcome: Progressing   Problem: Coping: Goal: Level of anxiety will decrease Outcome: Progressing   Problem: Elimination: Goal: Will not experience complications related to bowel motility Outcome: Progressing Goal: Will not experience complications related to urinary retention Outcome: Progressing   Problem: Pain Management: Goal: General experience of comfort will improve Outcome: Progressing   Problem: Safety: Goal: Ability to remain free from injury will improve Outcome: Progressing   Problem: Skin Integrity: Goal: Risk for impaired skin integrity will decrease Outcome: Progressing

## 2023-05-24 DIAGNOSIS — R6521 Severe sepsis with septic shock: Secondary | ICD-10-CM | POA: Diagnosis not present

## 2023-05-24 DIAGNOSIS — A419 Sepsis, unspecified organism: Secondary | ICD-10-CM | POA: Diagnosis not present

## 2023-05-24 DIAGNOSIS — N179 Acute kidney failure, unspecified: Secondary | ICD-10-CM | POA: Diagnosis not present

## 2023-05-24 LAB — GASTROINTESTINAL PANEL BY PCR, STOOL (REPLACES STOOL CULTURE)

## 2023-05-24 LAB — COMPREHENSIVE METABOLIC PANEL
ALT: 75 U/L — ABNORMAL HIGH (ref 0–44)
AST: 49 U/L — ABNORMAL HIGH (ref 15–41)
Albumin: 2.3 g/dL — ABNORMAL LOW (ref 3.5–5.0)
Alkaline Phosphatase: 103 U/L (ref 38–126)
Anion gap: 10 (ref 5–15)
BUN: 23 mg/dL (ref 8–23)
CO2: 20 mmol/L — ABNORMAL LOW (ref 22–32)
Calcium: 7.4 mg/dL — ABNORMAL LOW (ref 8.9–10.3)
Chloride: 101 mmol/L (ref 98–111)
Creatinine, Ser: 0.83 mg/dL (ref 0.44–1.00)
GFR, Estimated: >60 mL/min (ref >60)
Glucose, Bld: 158 mg/dL — ABNORMAL HIGH (ref 70–99)
Potassium: 3.3 mmol/L — ABNORMAL LOW (ref 3.5–5.1)
Sodium: 131 mmol/L — ABNORMAL LOW (ref 135–145)
Total Bilirubin: 0.8 mg/dL (ref <1.2)
Total Protein: 5.7 g/dL — ABNORMAL LOW (ref 6.5–8.1)

## 2023-05-24 LAB — CBC
HCT: 32.1 % — ABNORMAL LOW (ref 36.0–46.0)
Hemoglobin: 11.2 g/dL — ABNORMAL LOW (ref 12.0–15.0)
MCH: 29.4 pg (ref 26.0–34.0)
MCHC: 34.9 g/dL (ref 30.0–36.0)
MCV: 84.3 fL (ref 80.0–100.0)
Platelets: 83 10*3/uL — ABNORMAL LOW (ref 150–400)
RBC: 3.81 MIL/uL — ABNORMAL LOW (ref 3.87–5.11)
RDW: 15.6 % — ABNORMAL HIGH (ref 11.5–15.5)
WBC: 6.2 10*3/uL (ref 4.0–10.5)
nRBC: 0 % (ref 0.0–0.2)

## 2023-05-24 LAB — CULTURE, BLOOD (ROUTINE X 2)
Culture: NO GROWTH
Culture: NO GROWTH
Special Requests: ADEQUATE

## 2023-05-24 LAB — PHOSPHORUS: Phosphorus: 2.4 mg/dL — ABNORMAL LOW (ref 2.5–4.6)

## 2023-05-24 LAB — GLUCOSE, CAPILLARY
Glucose-Capillary: 140 mg/dL — ABNORMAL HIGH (ref 70–99)
Glucose-Capillary: 151 mg/dL — ABNORMAL HIGH (ref 70–99)
Glucose-Capillary: 263 mg/dL — ABNORMAL HIGH (ref 70–99)
Glucose-Capillary: 152 mg/dL — ABNORMAL HIGH (ref 70–99)

## 2023-05-24 MED ORDER — POTASSIUM PHOSPHATES 15 MMOLE/5ML IV SOLN
30.0000 mmol | Freq: Once | INTRAVENOUS | Status: AC
  Administered 2023-05-24: 30 mmol via INTRAVENOUS
  Filled 2023-05-24: qty 10

## 2023-05-24 MED ORDER — DIPHENOXYLATE-ATROPINE 2.5-0.025 MG PO TABS
1.0000 | ORAL_TABLET | Freq: Four times a day (QID) | ORAL | Status: DC
Start: 2023-05-24 — End: 2023-05-25
  Administered 2023-05-24 – 2023-05-25 (×6): 1 via ORAL
  Filled 2023-05-24 (×6): qty 1

## 2023-05-24 NOTE — Progress Notes (Signed)
PROGRESS NOTE    Jenny [name]  ZOX:[id number] DOB: 12/04/1945 DOA: 05/19/2023 PCP: Corky Downs, MD  111A/[id number]-AA  LOS: 4 days   Brief hospital course:   Assessment & Plan: 77 yo female who presented to [facility] ER on 11/18 via EMS with c/o "not feeling well", nausea, and vomiting. EMS reported when they arrived at pts home she was lethargic and diaphoretic with generalized weakness. She received iv fluids en route to the ER. Pts husband reports the pt has had a cough over the past 2 weeks, however her cough worsened today and she vomited bile prompting EMS notification.   Upon arrival to the ER pt hypotensive and tachycardic meeting sepsis criteria. She received 2L NS bolus/azithromycin/ceftriaxone, however she remained hypotensive requiring levophed gtt. CXR concerning for pneumonia.  PCCM team contacted for ICU admission.   Pt was transferred to hospitalist service on 05/22/23.  #Acute respiratory failure secondary to pneumonia  --currently on 1L [place] --Continue supplemental O2 to keep sats >=92%, wean as tolerated --completed 5 days of ceftriaxone and azithromycin --IV lasix 40 x1 on 11/22 --Continue supplemental O2 to keep sats >=92%, wean as tolerated  #Septic shock- PRESENT ON ADMISSION - DUE TO PNEUMONIA --hypotensive with lactic acidosis on arrival  --off pressor morning of 11/20.  BP stable.  NSTEMI --trop peaked at 848.  In the setting of respiratory distress, pneumonia, sepsis. --cardio consulted --Normal ejection fraction on echo  --cont Toprol 12.5 mg daily (new) -Consider ischemic workup following recovery, could be arranged as outpatient   Aortic valve stenosis status post bioprosthetic aortic valve  --no issue  #Chronic systolic CHF, ruled out  Hx: CAD, HLD, Stroke --cont ASA --resume statin after discharge   #Mild acute kidney injury secondary ATN  --Cr peaked at 1.27.    #Metabolic acidosis  #Lactic acidosis   #Hypokalemia  - monitor and  supplement PRN  Hypophos --monitor and supplement PRN   #Transaminitis - monitor   # Hx of Iron deficiency anemia  #Acute on chronic thrombocytopenia  - Trend CBC  - Monitor for s/sx of bleeding  - Transfuse for hgb <7   #Type II diabetes mellitus  - Hemoglobin A1c only 5.3  --ACHS and SSI for now  # IBS with diarrhea --pt reported at baseline, she takes Imodium for her diarrhea, minimum 2 mg x5 tabs per day. --multiple episodes of diarrhea noted, C diff and GI path neg.  Rectal tube inserted on 11/21.  Received Imodium and still having liquid stool --switch to Lomotil 1 tab QID   DVT prophylaxis: Heparin SQ Code Status: Full code  Family Communication: son updated at bedside today Level of care: Med-Surg Dispo:   The patient is from: home Anticipated d/c is to: SNF rehab Anticipated d/c date is: 2-3 days   Subjective and Interval History:  Pt reported feeling better, breathing improved.   Objective: Vitals:   05/24/23 0500 05/24/23 0759 05/24/23 1154 05/24/23 1607  BP:  (!) 124/54 (!) 122/48 (!) 125/54  Pulse:  82 81 81  Resp:      Temp:  98.4 F (36.9 C) 97.7 F (36.5 C) 98.2 F (36.8 C)  TempSrc:  Oral Oral Oral  SpO2:      Weight: 49.3 kg     Height:        Intake/Output Summary (Last 24 hours) at 05/24/2023 1909 Last data filed at 05/24/2023 0500 Gross per 24 hour  Intake 0 ml  Output 650 ml  Net -650 ml   Filed  Weights   05/21/23 0500 05/22/23 0500 05/24/23 0500  Weight: 45 kg 45.3 kg 49.3 kg    Examination:   Constitutional: NAD, alert HEENT: conjunctivae and lids normal, EOMI CV: No cyanosis.   RESP: normal respiratory effort, on 3L Neuro: II - XII grossly intact.   Psych: Normal mood and affect.   Rectal tube with liquid stool   Data Reviewed: I have personally reviewed labs and imaging studies  Time spent: 50 minutes  Darlin Priestly, MD [facility] If 7PM-7AM, please contact night-coverage 05/24/2023, 7:09 PM

## 2023-05-24 NOTE — Progress Notes (Signed)
Physical Therapy Treatment Patient Details Name: Jenny [name] MRN: [id number] DOB: Feb 01, 1946 Today's Date: 05/24/2023   History of Present Illness 77 y.o. female presented to [facility] ER on 11/18 via EMS with c/o "not feeling well", nausea, and vomiting. MD assessment: elevated troponin and valvular heart dzs in the setting of sepsis and AKI.  PMHx: CAD, severe AS s/p bioprosthetic AVR, HFpEF, DMII, HL, TIA, stroke, iron def anemia, COPD, anxiety, and hypothyroidism.    PT Comments  Pt was long sitting in bed, asleep, upon arrival. She remains lethargic at first but throughout session becomes more alert and conversational. She is agreeable to session but remains extremely weak. Pt requires max assist to achieve EOB sitting. Poor overall sitting balance while seated EOB however pt did perform a few exercises  prior to returning to supine. Severe cervical/thoracic flexed posture while seated EOB. Constant vcs to correct. Pt is able to lift head but unable to maintain.  Too weak to safely attempt transfers. Recommend use of mechanical lift for OOB. Pt was on O2 through with HR and sao2 stable. RN arrived during session to issue meds. Assisted with bed linen change and repositioning. Pt is far from her basleine abilities. Highly recommend continued skilled PT to maximize independneec and safety with all ADLs.    If plan is discharge home, recommend the following: Two people to help with walking and/or transfers;Two people to help with bathing/dressing/bathroom;Assistance with cooking/housework;Assistance with feeding;Direct supervision/assist for medications management;Direct supervision/assist for financial management;Assist for transportation;Help with stairs or ramp for entrance     Equipment Recommendations  Other (comment) (Defer to next level of care)       Precautions / Restrictions Precautions Precautions: Fall Restrictions Weight Bearing Restrictions: No     Mobility  Bed  Mobility Overal bed mobility: Needs Assistance Bed Mobility: Supine to Sit, Sit to Supine  Supine to sit: Used rails, Max assist, HOB elevated Sit to supine: Max assist General bed mobility comments: pt requires extensive assistance to achieve EOB sitting. poor sitting posture / balance throughout    Transfers  General transfer comment: unsafe at this time due to weakness/strength       Balance Overall balance assessment: Needs assistance, History of Falls Sitting-balance support: Feet supported, Bilateral upper extremity supported Sitting balance-Leahy Scale: Poor Sitting balance - Comments: pt requires extensive assistance to maintain balance. Occasionally able to progress to CGA but when performing EOB ther ex with LEs, severe posterior LOB       Cognition Arousal: Alert Behavior During Therapy: WFL for tasks assessed/performed Overall Cognitive Status: Within Functional Limits for tasks assessed    General Comments: Pt was very lethargic upon arrival. improved with activity however pt remains generally extremely weak iin all extremeties+ core           General Comments General comments (skin integrity, edema, etc.): Pt performed LAQ, seated marching, and reaching outside BOS . Pt has severely flexed cervical spine/thoracic spine throughout all EOB sitting      Pertinent Vitals/Pain Pain Assessment Pain Assessment: PAINAD Breathing: normal Negative Vocalization: occasional moan/groan, low speech, negative/disapproving quality Facial Expression: smiling or inexpressive Body Language: relaxed Consolability: no need to console PAINAD Score: 1 Pain Location: back/neck Pain Descriptors / Indicators: Sore Pain Intervention(s): Limited activity within patient's tolerance, Monitored during session, Repositioned     PT Goals (current goals can now be found in the care plan section) Acute Rehab PT Goals Patient Stated Goal: none stated Progress towards PT goals: Not  progressing toward  goals - comment    Frequency    Min 1X/week       Co-evaluation     PT goals addressed during session: Mobility/safety with mobility;Balance;Strengthening/ROM        AM-PAC PT "6 Clicks" Mobility   Outcome Measure  Help needed turning from your back to your side while in a flat bed without using bedrails?: A Lot Help needed moving from lying on your back to sitting on the side of a flat bed without using bedrails?: A Lot Help needed moving to and from a bed to a chair (including a wheelchair)?: Total Help needed standing up from a chair using your arms (e.g., wheelchair or bedside chair)?: Total Help needed to walk in hospital room?: Total Help needed climbing 3-5 steps with a railing? : Total 6 Click Score: 8    End of Session Equipment Utilized During Treatment: Oxygen Activity Tolerance: Patient limited by fatigue Patient left: in bed;with bed alarm set;with nursing/sitter in room Nurse Communication: Mobility status PT Visit Diagnosis: Muscle weakness (generalized) (M62.81);Other abnormalities of gait and mobility (R26.89)     Time: 1202-1225 PT Time Calculation (min) (ACUTE ONLY): 23 min  Charges:    $Therapeutic Exercise: 8-22 mins $Therapeutic Activity: 8-22 mins PT General Charges $$ ACUTE PT VISIT: 1 Visit                     Jetta Lout PTA 05/24/23, 12:42 PM

## 2023-05-24 NOTE — Progress Notes (Signed)
Cardiac monitoring removed following orders by Dr Fran Lowes.

## 2023-05-25 ENCOUNTER — Inpatient Hospital Stay: Payer: Medicare Other

## 2023-05-25 DIAGNOSIS — A419 Sepsis, unspecified organism: Secondary | ICD-10-CM | POA: Diagnosis not present

## 2023-05-25 DIAGNOSIS — R6521 Severe sepsis with septic shock: Secondary | ICD-10-CM | POA: Diagnosis not present

## 2023-05-25 DIAGNOSIS — N179 Acute kidney failure, unspecified: Secondary | ICD-10-CM | POA: Diagnosis not present

## 2023-05-25 LAB — GLUCOSE, CAPILLARY
Glucose-Capillary: 69 mg/dL — ABNORMAL LOW (ref 70–99)
Glucose-Capillary: 141 mg/dL — ABNORMAL HIGH (ref 70–99)
Glucose-Capillary: 133 mg/dL — ABNORMAL HIGH (ref 70–99)
Glucose-Capillary: 209 mg/dL — ABNORMAL HIGH (ref 70–99)
Glucose-Capillary: 180 mg/dL — ABNORMAL HIGH (ref 70–99)

## 2023-05-25 LAB — CBC
HCT: 29.7 % — ABNORMAL LOW (ref 36.0–46.0)
Hemoglobin: 10.4 g/dL — ABNORMAL LOW (ref 12.0–15.0)
MCH: 29.6 pg (ref 26.0–34.0)
MCHC: 35 g/dL (ref 30.0–36.0)
MCV: 84.6 fL (ref 80.0–100.0)
Platelets: 89 10*3/uL — ABNORMAL LOW (ref 150–400)
RBC: 3.51 MIL/uL — ABNORMAL LOW (ref 3.87–5.11)
RDW: 15.9 % — ABNORMAL HIGH (ref 11.5–15.5)
WBC: 6.4 10*3/uL (ref 4.0–10.5)
nRBC: 0 % (ref 0.0–0.2)

## 2023-05-25 LAB — BLOOD GAS, ARTERIAL
Acid-base deficit: 4.1 mmol/L — ABNORMAL HIGH (ref 0.0–2.0)
Bicarbonate: 20 mmol/L (ref 20.0–28.0)
FIO2: 36 %
O2 Content: 4 L/min
O2 Saturation: 91.8 %
Patient temperature: 37
pCO2 arterial: 33 mm[Hg] (ref 32–48)
pH, Arterial: 7.39 (ref 7.35–7.45)
pO2, Arterial: 61 mm[Hg] — ABNORMAL LOW (ref 83–108)

## 2023-05-25 LAB — PHOSPHORUS: Phosphorus: 3.3 mg/dL (ref 2.5–4.6)

## 2023-05-25 LAB — LACTIC ACID, PLASMA: Lactic Acid, Venous: 1.6 mmol/L (ref 0.5–1.9)

## 2023-05-25 LAB — BASIC METABOLIC PANEL
Anion gap: 8 (ref 5–15)
BUN: 32 mg/dL — ABNORMAL HIGH (ref 8–23)
CO2: 21 mmol/L — ABNORMAL LOW (ref 22–32)
Calcium: 7.2 mg/dL — ABNORMAL LOW (ref 8.9–10.3)
Chloride: 102 mmol/L (ref 98–111)
Creatinine, Ser: 0.96 mg/dL (ref 0.44–1.00)
GFR, Estimated: >60 mL/min (ref >60)
Glucose, Bld: 162 mg/dL — ABNORMAL HIGH (ref 70–99)
Potassium: 3.7 mmol/L (ref 3.5–5.1)
Sodium: 131 mmol/L — ABNORMAL LOW (ref 135–145)

## 2023-05-25 LAB — MAGNESIUM: Magnesium: 1.7 mg/dL (ref 1.7–2.4)

## 2023-05-25 MED ORDER — FUROSEMIDE 10 MG/ML IJ SOLN
40.0000 mg | Freq: Once | INTRAMUSCULAR | Status: AC
  Administered 2023-05-25: 40 mg via INTRAVENOUS
  Filled 2023-05-25: qty 4

## 2023-05-25 MED ORDER — DIPHENOXYLATE-ATROPINE 2.5-0.025 MG PO TABS
2.0000 | ORAL_TABLET | Freq: Four times a day (QID) | ORAL | Status: DC
  Administered 2023-05-25 – 2023-05-29 (×7): 2 via ORAL
  Filled 2023-05-25 (×8): qty 2

## 2023-05-25 NOTE — Significant Event (Signed)
Rapid Response Event Note   Reason for Call : called RRT for shortness of breath, decreased 02 sats.   Initial Focused Assessment: sitting up in bed, coughing, with non rebreather on. VSS... 02 dips to 80's, then returns to lows 90's. Pt c/o of can't be able to cough phlegm up, and ears itching.       Interventions: Dr Renae Gloss to bedside, examined patient. ABG, CXR, Lasix ordered. Ears checked. Changed from NRB to Bubble High Flow Cannula.   Plan of Care: as above, Charge Debi to call for further assistance.    Event Summary: as above  MD Notified: Lai/Wieting 0718 Call Time:0714 Arrival Time:0717 End Time:0740  Ovid Witman A, RN

## 2023-05-25 NOTE — Progress Notes (Addendum)
Patient  v/s was been done. Tech called stating Patient admitted with pneumonia, her sat maintaining at 77% refuse to increase after nebs xl, deep breathing and cough, sat remain unchanged. V/s with in normal ranges.called a rapid response. she been on 4L nasal cannula, she is alert and oriented x3. Attending informed. Patient is without any complaint. rapid is in the room. Dr Hilton Sinclair evaluated patient. Orders in place.

## 2023-05-25 NOTE — Progress Notes (Signed)
PROGRESS NOTE    Jenny [name]  ZHY:[id number] DOB: 1945/08/27 DOA: 05/19/2023 PCP: Corky Downs, MD  232A/[id number]-AA  LOS: 5 days   Brief hospital course:   Assessment & Plan: 77 yo female who presented to [facility] ER on 11/18 via EMS with c/o "not feeling well", nausea, and vomiting. EMS reported when they arrived at pts home she was lethargic and diaphoretic with generalized weakness. She received iv fluids en route to the ER. Pts husband reports the pt has had a cough over the past 2 weeks, however her cough worsened today and she vomited bile prompting EMS notification.   Upon arrival to the ER pt hypotensive and tachycardic meeting sepsis criteria. She received 2L NS bolus/azithromycin/ceftriaxone, however she remained hypotensive requiring levophed gtt. CXR concerning for pneumonia.  PCCM team contacted for ICU admission.   Pt was transferred to hospitalist service on 05/22/23.  #Acute respiratory failure with hypoxia --found to have PNA on presentation and completed a course of abx.  Supplemental oxygen was weaned down to 1L at one point, but requirement went up, and this morning was desating on 4L [place] and needed 15L hf. --CXR showed likely pulm edema.   Plan: --diurese --Continue supplemental O2 to keep sats >=92%  Pulm edema --CXR today showed "widespread pulmonary interstitial opacity" likely pulm edema.   --IV lasix 40 mg BID today --monitor urine output --BiPAP as needed  #Septic shock- PRESENT ON ADMISSION - DUE TO PNEUMONIA --hypotensive with lactic acidosis on arrival  --off pressor morning of 11/20.  BP stable.  NSTEMI --trop peaked at 848.  In the setting of respiratory distress, pneumonia, sepsis. --cardio consulted --Normal ejection fraction on echo  --cont Toprol 12.5 mg daily (new) -Consider ischemic workup following recovery, could be arranged as outpatient   Aortic valve stenosis status post bioprosthetic aortic valve  --no issue  # Chronic systolic  CHF, ruled out --current Echo showed normal LVEF  Hx: CAD, HLD, Stroke --cont ASA --resume statin after discharge   # Mild acute kidney injury secondary ATN  --Cr peaked at 1.27.   --monitor Cr while diuresing  #Metabolic acidosis  #Lactic acidosis   #Hypokalemia  - monitor and supplement PRN  Hypophos --monitor and supplement PRN   #Transaminitis - monitor   # Hx of Iron deficiency anemia  #Acute on chronic thrombocytopenia  - Trend CBC  - Monitor for s/sx of bleeding  - Transfuse for hgb <7   #Type II diabetes mellitus  - Hemoglobin A1c only 5.3  --ACHS and SSI for now  # IBS with diarrhea --pt reported at baseline, she takes Imodium for her diarrhea, minimum 2 mg x5 tabs per day. --multiple episodes of diarrhea noted, C diff and GI path neg.  Rectal tube inserted on 11/21.  Received Imodium and still having liquid stool --increase Lomotil to 2 tabs QID   DVT prophylaxis: Heparin SQ Code Status: Full code  Family Communication: husband updated at bedside today Level of care: Progressive Dispo:   The patient is from: home Anticipated d/c is to: SNF rehab Anticipated d/c date is: > 3 days   Subjective and Interval History:  Rapid called this morning for O2 desat, was put on 15L hf.  CXR showed likely pulm edema and pt was started on IV lasix 40.  Pt said she didn't feel short of breath.   Objective: Vitals:   05/25/23 1015 05/25/23 1341 05/25/23 1456 05/25/23 1834  BP:   (!) 112/45   Pulse:   72  Resp: (!) 30  20   Temp:   97.8 F (36.6 C)   TempSrc:      SpO2: (!) 88% 92% 92% 92%  Weight:      Height:        Intake/Output Summary (Last 24 hours) at 05/25/2023 1926 Last data filed at 05/25/2023 1745 Gross per 24 hour  Intake 240 ml  Output --  Net 240 ml   Filed Weights   05/22/23 0500 05/24/23 0500 05/25/23 0500  Weight: 45.3 kg 49.3 kg 48.2 kg    Examination:   Constitutional: NAD, alert HEENT: conjunctivae and lids normal,  EOMI CV: No cyanosis.   RESP: normal respiratory effort, on 15L Neuro: II - XII grossly intact.    Rectal tube with liquid stool   Data Reviewed: I have personally reviewed labs and imaging studies  Time spent: 50 minutes  Darlin Priestly, MD [facility] If 7PM-7AM, please contact night-coverage 05/25/2023, 7:26 PM

## 2023-05-26 ENCOUNTER — Inpatient Hospital Stay: Payer: Medicare Other

## 2023-05-26 ENCOUNTER — Other Ambulatory Visit: Payer: Self-pay

## 2023-05-26 DIAGNOSIS — J9601 Acute respiratory failure with hypoxia: Secondary | ICD-10-CM

## 2023-05-26 LAB — CBC
HCT: 27 % — ABNORMAL LOW (ref 36.0–46.0)
Hemoglobin: 9.5 g/dL — ABNORMAL LOW (ref 12.0–15.0)
MCH: 29.8 pg (ref 26.0–34.0)
MCHC: 35.2 g/dL (ref 30.0–36.0)
MCV: 84.6 fL (ref 80.0–100.0)
Platelets: 100 10*3/uL — ABNORMAL LOW (ref 150–400)
RBC: 3.19 MIL/uL — ABNORMAL LOW (ref 3.87–5.11)
RDW: 15.9 % — ABNORMAL HIGH (ref 11.5–15.5)
WBC: 7.3 10*3/uL (ref 4.0–10.5)
nRBC: 0 % (ref 0.0–0.2)

## 2023-05-26 LAB — BASIC METABOLIC PANEL
Anion gap: 6 (ref 5–15)
BUN: 43 mg/dL — ABNORMAL HIGH (ref 8–23)
CO2: 22 mmol/L (ref 22–32)
Calcium: 7 mg/dL — ABNORMAL LOW (ref 8.9–10.3)
Chloride: 103 mmol/L (ref 98–111)
Creatinine, Ser: 0.92 mg/dL (ref 0.44–1.00)
GFR, Estimated: >60 mL/min (ref >60)
Glucose, Bld: 146 mg/dL — ABNORMAL HIGH (ref 70–99)
Potassium: 3.7 mmol/L (ref 3.5–5.1)
Sodium: 131 mmol/L — ABNORMAL LOW (ref 135–145)

## 2023-05-26 LAB — BLOOD GAS, ARTERIAL
Acid-base deficit: 2.8 mmol/L — ABNORMAL HIGH (ref 0.0–2.0)
Bicarbonate: 21.9 mmol/L (ref 20.0–28.0)
FIO2: 70 %
O2 Content: 45 L/min
O2 Saturation: 93.2 %
Patient temperature: 37
pCO2 arterial: 37 mm[Hg] (ref 32–48)
pH, Arterial: 7.38 (ref 7.35–7.45)
pO2, Arterial: 66 mm[Hg] — ABNORMAL LOW (ref 83–108)

## 2023-05-26 LAB — D-DIMER, QUANTITATIVE: D-Dimer, Quant: 4.9 ug{FEU}/mL — ABNORMAL HIGH (ref 0.00–0.50)

## 2023-05-26 LAB — LACTIC ACID, PLASMA
Lactic Acid, Venous: 2 mmol/L (ref 0.5–1.9)
Lactic Acid, Venous: 1.7 mmol/L (ref 0.5–1.9)

## 2023-05-26 LAB — GLUCOSE, CAPILLARY
Glucose-Capillary: 112 mg/dL — ABNORMAL HIGH (ref 70–99)
Glucose-Capillary: 158 mg/dL — ABNORMAL HIGH (ref 70–99)
Glucose-Capillary: 180 mg/dL — ABNORMAL HIGH (ref 70–99)
Glucose-Capillary: 190 mg/dL — ABNORMAL HIGH (ref 70–99)

## 2023-05-26 LAB — MAGNESIUM: Magnesium: 1.8 mg/dL (ref 1.7–2.4)

## 2023-05-26 LAB — PROCALCITONIN: Procalcitonin: 6.44 ng/mL

## 2023-05-26 LAB — SARS CORONAVIRUS 2 BY RT PCR: SARS Coronavirus 2 by RT PCR: NEGATIVE

## 2023-05-26 MED ORDER — DIPHENHYDRAMINE HCL 50 MG/ML IJ SOLN
50.0000 mg | Freq: Once | INTRAMUSCULAR | Status: AC
  Administered 2023-05-26: 50 mg via INTRAVENOUS
  Filled 2023-05-26: qty 1

## 2023-05-26 MED ORDER — NOREPINEPHRINE 4 MG/250ML-% IV SOLN
0.0000 ug/min | INTRAVENOUS | Status: DC
  Administered 2023-05-26 – 2023-05-29 (×3): 2 ug/min via INTRAVENOUS
  Filled 2023-05-26 (×2): qty 250

## 2023-05-26 MED ORDER — FUROSEMIDE 10 MG/ML IJ SOLN
40.0000 mg | Freq: Two times a day (BID) | INTRAMUSCULAR | Status: DC
Start: 2023-05-26 — End: 2023-05-31
  Administered 2023-05-26 – 2023-05-31 (×9): 40 mg via INTRAVENOUS
  Filled 2023-05-26 (×10): qty 4

## 2023-05-26 MED ORDER — FUROSEMIDE 10 MG/ML IJ SOLN
40.0000 mg | Freq: Once | INTRAMUSCULAR | Status: AC
  Administered 2023-05-26: 40 mg via INTRAVENOUS
  Filled 2023-05-26: qty 4

## 2023-05-26 MED ORDER — METHYLPREDNISOLONE SODIUM SUCC 40 MG IJ SOLR
40.0000 mg | Freq: Once | INTRAMUSCULAR | Status: AC
  Administered 2023-05-26: 40 mg via INTRAVENOUS
  Filled 2023-05-26: qty 1

## 2023-05-26 MED ORDER — DIPHENHYDRAMINE HCL 50 MG PO CAPS: 50.0000 mg | ORAL_CAPSULE | Freq: Once | ORAL | Status: DC

## 2023-05-26 MED ORDER — DIPHENHYDRAMINE HCL 50 MG PO CAPS
50.0000 mg | ORAL_CAPSULE | Freq: Once | ORAL | Status: DC
  Filled 2023-05-26: qty 1

## 2023-05-26 MED ORDER — IOHEXOL 350 MG/ML SOLN
75.0000 mL | Freq: Once | INTRAVENOUS | Status: AC | PRN
  Administered 2023-05-26: 48 mL via INTRAVENOUS

## 2023-05-26 MED ORDER — SODIUM CHLORIDE 0.9% FLUSH: 10.0000 mL | INTRAVENOUS | Status: DC | PRN

## 2023-05-26 MED ORDER — SODIUM CHLORIDE 0.9% FLUSH
10.0000 mL | Freq: Two times a day (BID) | INTRAVENOUS | Status: DC
  Administered 2023-05-26 – 2023-06-02 (×14): 10 mL

## 2023-05-26 NOTE — Progress Notes (Signed)
RT assisted with patient transport to CT and back with no complicatons. PT transported on Airvo Heated High Flow nasal cannula.

## 2023-05-26 NOTE — Plan of Care (Signed)
Progressing towards goals

## 2023-05-26 NOTE — Progress Notes (Signed)
PROGRESS NOTE    Jenny [name]  ZOX:[id number] DOB: 03-12-1946 DOA: 05/19/2023 PCP: Corky Downs, MD  [id number]/[id number]  LOS: 6 days   Brief hospital course:   Assessment & Plan: 77 yo female who presented to [facility] ER on 11/18 via EMS with c/o "not feeling well", nausea, and vomiting. EMS reported when they arrived at pts home she was lethargic and diaphoretic with generalized weakness. She received iv fluids en route to the ER. Pts husband reports the pt has had a cough over the past 2 weeks, however her cough worsened today and she vomited bile prompting EMS notification.   Upon arrival to the ER pt hypotensive and tachycardic meeting sepsis criteria. She received 2L NS bolus/azithromycin/ceftriaxone, however she remained hypotensive requiring levophed gtt. CXR concerning for pneumonia.  PCCM team contacted for ICU admission.   Pt was transferred to hospitalist service on 05/22/23.  #Acute respiratory failure with hypoxia --found to have PNA on presentation and completed a course of abx.  Supplemental oxygen was weaned down to 1L at one point, but requirement went up, and this morning was desating 15L hf --CXR showed likely pulm edema.  Was started on IV lasix 40 BID --CTA chest today neg for PE, and again showed "Severe diffuse bilateral airspace disease with small effusions. Findings could reflect edema or infection."  Pt had finished a course of abx recently, no fever, no leukocytosis, procal trending down, not as likely to have a new PNA, therefore, have not resumed on abx. Plan: --diurese --Continue supplemental O2 to keep sats >=92% --PCCM team updated and will intubate if respiratory status continues to worsen.  Pulm edema --CXR showed "widespread pulmonary interstitial opacity" likely pulm edema.   --IV lasix 40 mg BID started on 11/24, however, could not get accurate measurement of urine output Plan: --cont IV lasix 40 BID --Foley to monitor urine  output  Hypotension --BP low of 80's today. --start low-dose pressor to improve BP and help with diuresis.    #Septic shock- PRESENT ON ADMISSION - DUE TO PNEUMONIA --hypotensive with lactic acidosis on arrival  --off pressor morning of 11/20.  Resume today.  NSTEMI --trop peaked at 848.  In the setting of respiratory distress, pneumonia, sepsis. --cardio consulted --Normal ejection fraction on echo  --cont Toprol 12.5 mg daily (new) -Consider ischemic workup following recovery, could be arranged as outpatient   Aortic valve stenosis status post bioprosthetic aortic valve  --no issue  # Chronic systolic CHF, ruled out --current Echo showed normal LVEF  Hx: CAD, HLD, Stroke --cont ASA --resume statin after discharge   # Mild acute kidney injury secondary ATN  --Cr peaked at 1.27.   --monitor Cr while diuresing  #Metabolic acidosis  #Lactic acidosis   #Hypokalemia  - monitor and supplement PRN  Hypophos --monitor and supplement PRN   #Transaminitis - monitor   # Hx of Iron deficiency anemia  #Acute on chronic thrombocytopenia  - Trend CBC  - Monitor for s/sx of bleeding  - Transfuse for hgb <7   #Type II diabetes mellitus  - Hemoglobin A1c only 5.3  --ACHS and SSI for now  # IBS with diarrhea --pt reported at baseline, she takes Imodium for her diarrhea, minimum 2 mg x5 tabs per day. --multiple episodes of diarrhea noted, C diff and GI path neg.  Rectal tube inserted on 11/21.  Received Imodium and still having liquid stool --cont Lomotil to 2 tabs QID   DVT prophylaxis: Heparin SQ Code Status: Full code  Family  Communication: husband updated at bedside today Level of care: Stepdown Dispo:   The patient is from: home Anticipated d/c is to: SNF rehab Anticipated d/c date is: > 3 days   Subjective and Interval History:  Pt desat on 15L and therefore was switched to heated hf and transferred to stepdown.  PCCM team updated on pt's condition and  transfer.  Surprisingly, pt reported no shortness of breath and was not in distress, however, was intermittently somnolent.    Pt has been receiving IV lasix 40 BID since yesterday, but could not get accurate urine output, therefore, Foley placed.  Due to low BP, low-dose pressor started to help with diuresis.   Objective: Vitals:   05/26/23 1600 05/26/23 1647 05/26/23 1700 05/26/23 1800  BP:   (!) 85/37 (!) 104/42  Pulse: 69  65 65  Resp: (!) 22  (!) 22 (!) 21  Temp:      TempSrc:      SpO2: 92% 92% 93% 96%  Weight:      Height:        Intake/Output Summary (Last 24 hours) at 05/26/2023 1915 Last data filed at 05/26/2023 0650 Gross per 24 hour  Intake --  Output 800 ml  Net -800 ml   Filed Weights   05/24/23 0500 05/25/23 0500 05/26/23 0403  Weight: 49.3 kg 48.2 kg 48.1 kg    Examination:   Constitutional: NAD, sleepy, but arousable HEENT: conjunctivae and lids normal, EOMI CV: No cyanosis.   RESP: normal respiratory effort, heated hf SKIN: warm, dry Psych: subdued mood and affect.    Rectal tube with liquid stool   Data Reviewed: I have personally reviewed labs and imaging studies  Time spent: 50 minutes, with critical care billing due to respiratory decompensation requiring heated hf and transfer to stepdown.   Darlin Priestly, MD [facility] If 7PM-7AM, please contact night-coverage 05/26/2023, 7:15 PM

## 2023-05-26 NOTE — Progress Notes (Signed)
Peripherally Inserted Central Catheter Placement  The IV Nurse has discussed with the patient and/or persons authorized to consent for the patient, the purpose of this procedure and the potential benefits and risks involved with this procedure.  The benefits include less needle sticks, lab draws from the catheter, and the patient may be discharged home with the catheter. Risks include, but not limited to, infection, bleeding, blood clot (thrombus formation), and puncture of an artery; nerve damage and irregular heartbeat and possibility to perform a PICC exchange if needed/ordered by physician.  Alternatives to this procedure were also discussed.  Bard Power PICC patient education guide, fact sheet on infection prevention and patient information card has been provided to patient /or left at bedside.    PICC Placement Documentation  PICC Triple Lumen 05/26/23 Right Brachial 35 cm 1 cm (Active)  Indication for Insertion or Continuance of Line Vasoactive infusions 05/26/23 2032  Exposed Catheter (cm) 1 cm 05/26/23 2032  Site Assessment Clean, Dry, Intact 05/26/23 2032  Lumen #1 Status Blood return noted;Flushed;Saline locked 05/26/23 2032  Lumen #2 Status Blood return noted;Flushed;Saline locked 05/26/23 2032  Lumen #3 Status Blood return noted;Flushed;Saline locked 05/26/23 2032  Dressing Type Transparent;Securing device 05/26/23 2032  Dressing Status Antimicrobial disc in place;Clean, Dry, Intact 05/26/23 2032  Line Care Connections checked and tightened 05/26/23 2032  Line Adjustment (NICU/IV Team Only) No 05/26/23 2032  Dressing Intervention New dressing;Adhesive placed at insertion site (IV team only) 05/26/23 2032  Dressing Change Due  05/26/23 2032       Christeen Douglas 05/26/2023, 8:51 PM

## 2023-05-26 NOTE — Plan of Care (Signed)
Problem: Education: Goal: Ability to describe self-care measures that may prevent or decrease complications (Diabetes Survival Skills Education) will improve 05/26/2023 1902 by Modena Jansky, RN Outcome: Progressing 05/26/2023 1902 by Modena Jansky, RN Outcome: Progressing Goal: Individualized Educational Video(s) 05/26/2023 1902 by Modena Jansky, RN Outcome: Progressing 05/26/2023 1902 by Modena Jansky, RN Outcome: Progressing   Problem: Coping: Goal: Ability to adjust to condition or change in health will improve 05/26/2023 1902 by Modena Jansky, RN Outcome: Progressing 05/26/2023 1902 by Modena Jansky, RN Outcome: Progressing   Problem: Fluid Volume: Goal: Ability to maintain a balanced intake and output will improve 05/26/2023 1902 by Modena Jansky, RN Outcome: Progressing 05/26/2023 1902 by Modena Jansky, RN Outcome: Progressing   Problem: Health Behavior/Discharge Planning: Goal: Ability to identify and utilize available resources and services will improve 05/26/2023 1902 by Modena Jansky, RN Outcome: Progressing 05/26/2023 1902 by Modena Jansky, RN Outcome: Progressing Goal: Ability to manage health-related needs will improve 05/26/2023 1902 by Modena Jansky, RN Outcome: Progressing 05/26/2023 1902 by Modena Jansky, RN Outcome: Progressing   Problem: Metabolic: Goal: Ability to maintain appropriate glucose levels will improve 05/26/2023 1902 by Modena Jansky, RN Outcome: Progressing 05/26/2023 1902 by Modena Jansky, RN Outcome: Progressing   Problem: Nutritional: Goal: Maintenance of adequate nutrition will improve 05/26/2023 1902 by Modena Jansky, RN Outcome: Progressing 05/26/2023 1902 by Modena Jansky, RN Outcome: Progressing Goal: Progress toward achieving an optimal weight will improve 05/26/2023 1902 by Modena Jansky, RN Outcome: Progressing 05/26/2023 1902 by  Modena Jansky, RN Outcome: Progressing   Problem: Skin Integrity: Goal: Risk for impaired skin integrity will decrease 05/26/2023 1902 by Modena Jansky, RN Outcome: Progressing 05/26/2023 1902 by Modena Jansky, RN Outcome: Progressing   Problem: Tissue Perfusion: Goal: Adequacy of tissue perfusion will improve 05/26/2023 1902 by Modena Jansky, RN Outcome: Progressing 05/26/2023 1902 by Modena Jansky, RN Outcome: Progressing   Problem: Education: Goal: Knowledge of General Education information will improve Description: Including pain rating scale, medication(s)/side effects and non-pharmacologic comfort measures 05/26/2023 1902 by Modena Jansky, RN Outcome: Progressing 05/26/2023 1902 by Modena Jansky, RN Outcome: Progressing   Problem: Health Behavior/Discharge Planning: Goal: Ability to manage health-related needs will improve 05/26/2023 1902 by Modena Jansky, RN Outcome: Progressing 05/26/2023 1902 by Modena Jansky, RN Outcome: Progressing   Problem: Clinical Measurements: Goal: Ability to maintain clinical measurements within normal limits will improve 05/26/2023 1902 by Modena Jansky, RN Outcome: Progressing 05/26/2023 1902 by Modena Jansky, RN Outcome: Progressing Goal: Will remain free from infection 05/26/2023 1902 by Modena Jansky, RN Outcome: Progressing 05/26/2023 1902 by Modena Jansky, RN Outcome: Progressing Goal: Diagnostic test results will improve 05/26/2023 1902 by Modena Jansky, RN Outcome: Progressing 05/26/2023 1902 by Modena Jansky, RN Outcome: Progressing Goal: Respiratory complications will improve 05/26/2023 1902 by Modena Jansky, RN Outcome: Progressing 05/26/2023 1902 by Modena Jansky, RN Outcome: Progressing Goal: Cardiovascular complication will be avoided 05/26/2023 1902 by Modena Jansky, RN Outcome: Progressing 05/26/2023 1902 by Modena Jansky, RN Outcome: Progressing   Problem: Activity: Goal: Risk for activity intolerance will decrease 05/26/2023 1902 by Modena Jansky, RN Outcome: Progressing 05/26/2023 1902 by Modena Jansky, RN Outcome: Progressing   Problem: Nutrition: Goal: Adequate nutrition will be maintained 05/26/2023 1902 by Modena Jansky, RN Outcome: Progressing 05/26/2023 1902 by Modena Jansky, RN Outcome: Progressing  Problem: Coping: Goal: Level of anxiety will decrease 05/26/2023 1902 by Modena Jansky, RN Outcome: Progressing 05/26/2023 1902 by Modena Jansky, RN Outcome: Progressing   Problem: Elimination: Goal: Will not experience complications related to bowel motility 05/26/2023 1902 by Modena Jansky, RN Outcome: Progressing 05/26/2023 1902 by Modena Jansky, RN Outcome: Progressing Goal: Will not experience complications related to urinary retention 05/26/2023 1902 by Modena Jansky, RN Outcome: Progressing 05/26/2023 1902 by Modena Jansky, RN Outcome: Progressing   Problem: Pain Management: Goal: General experience of comfort will improve 05/26/2023 1902 by Modena Jansky, RN Outcome: Progressing 05/26/2023 1902 by Modena Jansky, RN Outcome: Progressing   Problem: Safety: Goal: Ability to remain free from injury will improve 05/26/2023 1902 by Modena Jansky, RN Outcome: Progressing 05/26/2023 1902 by Modena Jansky, RN Outcome: Progressing   Problem: Skin Integrity: Goal: Risk for impaired skin integrity will decrease 05/26/2023 1902 by Modena Jansky, RN Outcome: Progressing 05/26/2023 1902 by Modena Jansky, RN Outcome: Progressing   Problem: Education: Goal: Ability to describe self-care measures that may prevent or decrease complications (Diabetes Survival Skills Education) will improve 05/26/2023 1902 by Modena Jansky, RN Outcome: Progressing 05/26/2023 1902 by Modena Jansky,  RN Outcome: Progressing Goal: Individualized Educational Video(s) 05/26/2023 1902 by Modena Jansky, RN Outcome: Progressing 05/26/2023 1902 by Modena Jansky, RN Outcome: Progressing   Problem: Coping: Goal: Ability to adjust to condition or change in health will improve 05/26/2023 1902 by Modena Jansky, RN Outcome: Progressing 05/26/2023 1902 by Modena Jansky, RN Outcome: Progressing   Problem: Fluid Volume: Goal: Ability to maintain a balanced intake and output will improve 05/26/2023 1902 by Modena Jansky, RN Outcome: Progressing 05/26/2023 1902 by Modena Jansky, RN Outcome: Progressing   Problem: Health Behavior/Discharge Planning: Goal: Ability to identify and utilize available resources and services will improve 05/26/2023 1902 by Modena Jansky, RN Outcome: Progressing 05/26/2023 1902 by Modena Jansky, RN Outcome: Progressing Goal: Ability to manage health-related needs will improve 05/26/2023 1902 by Modena Jansky, RN Outcome: Progressing 05/26/2023 1902 by Modena Jansky, RN Outcome: Progressing   Problem: Metabolic: Goal: Ability to maintain appropriate glucose levels will improve 05/26/2023 1902 by Modena Jansky, RN Outcome: Progressing 05/26/2023 1902 by Modena Jansky, RN Outcome: Progressing   Problem: Nutritional: Goal: Maintenance of adequate nutrition will improve 05/26/2023 1902 by Modena Jansky, RN Outcome: Progressing 05/26/2023 1902 by Modena Jansky, RN Outcome: Progressing Goal: Progress toward achieving an optimal weight will improve 05/26/2023 1902 by Modena Jansky, RN Outcome: Progressing 05/26/2023 1902 by Modena Jansky, RN Outcome: Progressing   Problem: Skin Integrity: Goal: Risk for impaired skin integrity will decrease 05/26/2023 1902 by Modena Jansky, RN Outcome: Progressing 05/26/2023 1902 by Modena Jansky, RN Outcome: Progressing   Problem:  Tissue Perfusion: Goal: Adequacy of tissue perfusion will improve 05/26/2023 1902 by Modena Jansky, RN Outcome: Progressing 05/26/2023 1902 by Modena Jansky, RN Outcome: Progressing   Problem: Education: Goal: Knowledge of General Education information will improve Description: Including pain rating scale, medication(s)/side effects and non-pharmacologic comfort measures 05/26/2023 1902 by Modena Jansky, RN Outcome: Progressing 05/26/2023 1902 by Modena Jansky, RN Outcome: Progressing   Problem: Health Behavior/Discharge Planning: Goal: Ability to manage health-related needs will improve 05/26/2023 1902 by Modena Jansky, RN Outcome: Progressing 05/26/2023 1902 by Modena Jansky, RN Outcome: Progressing   Problem: Clinical Measurements: Goal: Ability to maintain  clinical measurements within normal limits will improve 05/26/2023 1902 by Modena Jansky, RN Outcome: Progressing 05/26/2023 1902 by Modena Jansky, RN Outcome: Progressing Goal: Will remain free from infection 05/26/2023 1902 by Modena Jansky, RN Outcome: Progressing 05/26/2023 1902 by Modena Jansky, RN Outcome: Progressing Goal: Diagnostic test results will improve 05/26/2023 1902 by Modena Jansky, RN Outcome: Progressing 05/26/2023 1902 by Modena Jansky, RN Outcome: Progressing Goal: Respiratory complications will improve 05/26/2023 1902 by Modena Jansky, RN Outcome: Progressing 05/26/2023 1902 by Modena Jansky, RN Outcome: Progressing Goal: Cardiovascular complication will be avoided 05/26/2023 1902 by Modena Jansky, RN Outcome: Progressing 05/26/2023 1902 by Modena Jansky, RN Outcome: Progressing   Problem: Activity: Goal: Risk for activity intolerance will decrease 05/26/2023 1902 by Modena Jansky, RN Outcome: Progressing 05/26/2023 1902 by Modena Jansky, RN Outcome: Progressing   Problem: Nutrition: Goal:  Adequate nutrition will be maintained 05/26/2023 1902 by Modena Jansky, RN Outcome: Progressing 05/26/2023 1902 by Modena Jansky, RN Outcome: Progressing   Problem: Coping: Goal: Level of anxiety will decrease 05/26/2023 1902 by Modena Jansky, RN Outcome: Progressing 05/26/2023 1902 by Modena Jansky, RN Outcome: Progressing   Problem: Elimination: Goal: Will not experience complications related to bowel motility 05/26/2023 1902 by Modena Jansky, RN Outcome: Progressing 05/26/2023 1902 by Modena Jansky, RN Outcome: Progressing Goal: Will not experience complications related to urinary retention 05/26/2023 1902 by Modena Jansky, RN Outcome: Progressing 05/26/2023 1902 by Modena Jansky, RN Outcome: Progressing   Problem: Pain Management: Goal: General experience of comfort will improve 05/26/2023 1902 by Modena Jansky, RN Outcome: Progressing 05/26/2023 1902 by Modena Jansky, RN Outcome: Progressing   Problem: Safety: Goal: Ability to remain free from injury will improve 05/26/2023 1902 by Modena Jansky, RN Outcome: Progressing 05/26/2023 1902 by Modena Jansky, RN Outcome: Progressing   Problem: Skin Integrity: Goal: Risk for impaired skin integrity will decrease 05/26/2023 1902 by Modena Jansky, RN Outcome: Progressing 05/26/2023 1902 by Modena Jansky, RN Outcome: Progressing

## 2023-05-26 NOTE — Progress Notes (Signed)
OT Cancellation Note  Patient Details Name: Jenny [name] MRN: [id number] DOB: 07-Aug-1945   Cancelled Treatment:    Reason Eval/Treat Not Completed: Medical issues which prohibited therapy Rapid response called on pt yesterday d/t low 02 sats on 4L [place] with pt ultimately placed on HHFNC at 15L and transitioned from unit 1c to 2a. Due to change in status/units pt will need new therapy orders. MD consulted and wishes to hold further therapy services at this time and will place new order when pt is medically stable. OT to sign off in house at this time.  Constance Goltz 05/26/2023, 9:49 AM

## 2023-05-26 NOTE — Progress Notes (Signed)
   05/26/23 1500  Spiritual Encounters  Type of Visit Initial  Care provided to: Pt and family  Referral source Patient request  Reason for visit Religious ritual  OnCall Visit No  Spiritual Framework  Presenting Themes Goals in life/care;Values and beliefs;Courage hope and growth;Impactful experiences and emotions  Community/Connection Family;Significant other  Patient Stress Factors Health changes  Interventions  Spiritual Care Interventions Made Established relationship of care and support;Compassionate presence;Reflective listening;Prayer;Encouragement  Intervention Outcomes  Outcomes Connection to spiritual care;Awareness around self/spiritual resourses;Reduced anxiety  Spiritual Care Plan  Spiritual Care Issues Still Outstanding No further spiritual care needs at this time (see row info)   Chaplain received a spiritual consult for prayer. Chaplain met with the patient and her family and talked with them and prayed with them. Patient thanked the chaplain for coming. Chaplain told her that chaplain services is available for emotional and spiritual support when she needs it.

## 2023-05-26 NOTE — Progress Notes (Signed)
PT Cancellation Note  Patient Details Name: Jenny [name] MRN: [id number] DOB: 07-Dec-1945   Cancelled Treatment:    Reason Eval/Treat Not Completed: Other (comment). Upon chart review, pt with rapid response yesterday resulting in transitioning to higher level of care in addition to increased O2 requirements. Discussed with MD. Will dc in house at this time with MD to re-consult when medically stable for participation.   Derell Bruun 05/26/2023, 9:53 AM Elizabeth Palau, PT, DPT, GCS [phone]

## 2023-05-26 NOTE — Progress Notes (Signed)
Patient resting in bed. Denies pain or discomfort at this time. No urine output documented since arrival to the floor. Bladder scanned for 349 ml. Dr notified, see new orders. Patient states she feels the urge to urinate. Will bladder scan again per orders. RT called for [facility].

## 2023-05-27 ENCOUNTER — Inpatient Hospital Stay: Payer: Medicare Other

## 2023-05-27 DIAGNOSIS — E871 Hypo-osmolality and hyponatremia: Secondary | ICD-10-CM | POA: Diagnosis not present

## 2023-05-27 DIAGNOSIS — I5023 Acute on chronic systolic (congestive) heart failure: Secondary | ICD-10-CM

## 2023-05-27 DIAGNOSIS — N179 Acute kidney failure, unspecified: Secondary | ICD-10-CM | POA: Diagnosis not present

## 2023-05-27 DIAGNOSIS — J9601 Acute respiratory failure with hypoxia: Secondary | ICD-10-CM | POA: Diagnosis not present

## 2023-05-27 DIAGNOSIS — D649 Anemia, unspecified: Secondary | ICD-10-CM

## 2023-05-27 DIAGNOSIS — J189 Pneumonia, unspecified organism: Secondary | ICD-10-CM

## 2023-05-27 DIAGNOSIS — A419 Sepsis, unspecified organism: Secondary | ICD-10-CM | POA: Diagnosis not present

## 2023-05-27 DIAGNOSIS — R6521 Severe sepsis with septic shock: Secondary | ICD-10-CM | POA: Diagnosis not present

## 2023-05-27 LAB — CBC
HCT: 25.3 % — ABNORMAL LOW (ref 36.0–46.0)
Hemoglobin: 8.5 g/dL — ABNORMAL LOW (ref 12.0–15.0)
MCH: 29.9 pg (ref 26.0–34.0)
MCHC: 33.6 g/dL (ref 30.0–36.0)
MCV: 89.1 fL (ref 80.0–100.0)
Platelets: 90 10*3/uL — ABNORMAL LOW (ref 150–400)
RBC: 2.84 MIL/uL — ABNORMAL LOW (ref 3.87–5.11)
RDW: 15.9 % — ABNORMAL HIGH (ref 11.5–15.5)
WBC: 5.5 10*3/uL (ref 4.0–10.5)
nRBC: 0 % (ref 0.0–0.2)

## 2023-05-27 LAB — BLOOD GAS, ARTERIAL
Acid-base deficit: 4 mmol/L — ABNORMAL HIGH (ref 0.0–2.0)
Bicarbonate: 19.8 mmol/L — ABNORMAL LOW (ref 20.0–28.0)
FIO2: 100 %
O2 Content: 45 L/min
O2 Saturation: 95.3 %
Patient temperature: 37
pCO2 arterial: 32 mm[Hg] (ref 32–48)
pH, Arterial: 7.4 (ref 7.35–7.45)
pO2, Arterial: 71 mm[Hg] — ABNORMAL LOW (ref 83–108)

## 2023-05-27 LAB — GLUCOSE, CAPILLARY
Glucose-Capillary: 333 mg/dL — ABNORMAL HIGH (ref 70–99)
Glucose-Capillary: 270 mg/dL — ABNORMAL HIGH (ref 70–99)
Glucose-Capillary: 175 mg/dL — ABNORMAL HIGH (ref 70–99)
Glucose-Capillary: 106 mg/dL — ABNORMAL HIGH (ref 70–99)
Glucose-Capillary: 285 mg/dL — ABNORMAL HIGH (ref 70–99)
Glucose-Capillary: 400 mg/dL — ABNORMAL HIGH (ref 70–99)

## 2023-05-27 LAB — BASIC METABOLIC PANEL
Anion gap: 10 (ref 5–15)
BUN: 54 mg/dL — ABNORMAL HIGH (ref 8–23)
CO2: 20 mmol/L — ABNORMAL LOW (ref 22–32)
Calcium: 7.1 mg/dL — ABNORMAL LOW (ref 8.9–10.3)
Chloride: 103 mmol/L (ref 98–111)
Creatinine, Ser: 1.08 mg/dL — ABNORMAL HIGH (ref 0.44–1.00)
GFR, Estimated: 54 mL/min — ABNORMAL LOW (ref >60)
Glucose, Bld: 377 mg/dL — ABNORMAL HIGH (ref 70–99)
Potassium: 3.6 mmol/L (ref 3.5–5.1)
Sodium: 133 mmol/L — ABNORMAL LOW (ref 135–145)

## 2023-05-27 LAB — PROCALCITONIN: Procalcitonin: 6.26 ng/mL

## 2023-05-27 LAB — MAGNESIUM: Magnesium: 1.7 mg/dL (ref 1.7–2.4)

## 2023-05-27 MED ORDER — INSULIN ASPART 100 UNIT/ML IJ SOLN
0.0000 [IU] | Freq: Three times a day (TID) | INTRAMUSCULAR | Status: DC
  Administered 2023-05-28: 15 [IU] via SUBCUTANEOUS

## 2023-05-27 MED ORDER — IPRATROPIUM-ALBUTEROL 0.5-2.5 (3) MG/3ML IN SOLN
3.0000 mL | RESPIRATORY_TRACT | Status: DC
  Administered 2023-05-27 – 2023-06-02 (×35): 3 mL via RESPIRATORY_TRACT
  Filled 2023-05-27 (×36): qty 3

## 2023-05-27 MED ORDER — SODIUM CHLORIDE 0.9 % IV SOLN
2.0000 g | Freq: Two times a day (BID) | INTRAVENOUS | Status: DC
  Filled 2023-05-27: qty 12.5

## 2023-05-27 MED ORDER — METHYLPREDNISOLONE SODIUM SUCC 40 MG IJ SOLR
40.0000 mg | Freq: Every day | INTRAMUSCULAR | Status: DC
  Administered 2023-05-27 – 2023-05-29 (×3): 40 mg via INTRAVENOUS
  Filled 2023-05-27 (×3): qty 1

## 2023-05-27 MED ORDER — DOXYCYCLINE HYCLATE 100 MG IV SOLR
100.0000 mg | Freq: Two times a day (BID) | INTRAVENOUS | Status: DC
  Administered 2023-05-27 – 2023-05-31 (×8): 100 mg via INTRAVENOUS
  Filled 2023-05-27 (×9): qty 100

## 2023-05-27 MED ORDER — INSULIN ASPART 100 UNIT/ML IJ SOLN
4.0000 [IU] | Freq: Once | INTRAMUSCULAR | Status: AC
  Administered 2023-05-27: 4 [IU] via SUBCUTANEOUS
  Filled 2023-05-27: qty 1

## 2023-05-27 MED ORDER — SODIUM CHLORIDE 0.9 % IV SOLN
3.0000 g | Freq: Two times a day (BID) | INTRAVENOUS | Status: DC
Start: 2023-05-27 — End: 2023-05-27
  Administered 2023-05-27: 3 g via INTRAVENOUS
  Filled 2023-05-27: qty 8

## 2023-05-27 MED ORDER — INSULIN ASPART 100 UNIT/ML IJ SOLN
0.0000 [IU] | Freq: Every day | INTRAMUSCULAR | Status: DC
  Administered 2023-05-27: 5 [IU] via SUBCUTANEOUS
  Filled 2023-05-27 (×2): qty 1

## 2023-05-27 MED ORDER — MAGNESIUM SULFATE 2 GM/50ML IV SOLN
2.0000 g | Freq: Once | INTRAVENOUS | Status: AC
Start: 2023-05-27 — End: 2023-05-27
  Administered 2023-05-27: 2 g via INTRAVENOUS
  Filled 2023-05-27: qty 50

## 2023-05-27 MED ORDER — POTASSIUM CHLORIDE CRYS ER 20 MEQ PO TBCR: 40.0000 meq | EXTENDED_RELEASE_TABLET | Freq: Once | ORAL | Status: DC

## 2023-05-27 MED ORDER — METRONIDAZOLE 500 MG/100ML IV SOLN
500.0000 mg | Freq: Two times a day (BID) | INTRAVENOUS | Status: DC
  Filled 2023-05-27: qty 100

## 2023-05-27 MED ORDER — PIPERACILLIN-TAZOBACTAM 3.375 G IVPB
3.3750 g | Freq: Three times a day (TID) | INTRAVENOUS | Status: DC
  Administered 2023-05-27 – 2023-05-31 (×12): 3.375 g via INTRAVENOUS
  Filled 2023-05-27 (×12): qty 50

## 2023-05-27 NOTE — Plan of Care (Signed)
  Problem: Coping: Goal: Ability to adjust to condition or change in health will improve Outcome: Not Progressing   Problem: Fluid Volume: Goal: Ability to maintain a balanced intake and output will improve Outcome: Not Progressing   Problem: Nutritional: Goal: Maintenance of adequate nutrition will improve Outcome: Not Progressing   Problem: Nutrition: Goal: Adequate nutrition will be maintained Outcome: Not Progressing   Problem: Coping: Goal: Level of anxiety will decrease Outcome: Not Progressing

## 2023-05-27 NOTE — TOC Progression Note (Signed)
Transition of Care [facility]) - Progression Note    Patient Details  Name: Jenny [name] MRN: [id number] Date of Birth: Dec 05, 1945  Transition of Care [facility]) CM/SW Contact  Truddie Hidden, RN Phone Number: 05/27/2023, 3:25 PM  Clinical Narrative:    Attempt to give bed offers to patient's spouse. No answer. Left a message.     Expected Discharge Plan: Skilled Nursing Facility    Expected Discharge Plan and Services                                               Social Determinants of Health (SDOH) Interventions SDOH Screenings   Food Insecurity: No Food Insecurity (05/20/2023)  Housing: Patient Declined (05/20/2023)  Transportation Needs: No Transportation Needs (05/20/2023)  Utilities: Not At Risk (05/20/2023)  Alcohol Screen: Low Risk  (05/18/2021)  Depression (PHQ2-9): Low Risk  (12/11/2021)  Financial Resource Strain: Low Risk  (05/18/2021)  Physical Activity: Insufficiently Active (05/18/2021)  Social Connections: Socially Integrated (05/18/2021)  Stress: No Stress Concern Present (05/18/2021)  Tobacco Use: Medium Risk (05/19/2023)    Readmission Risk Interventions     No data to display

## 2023-05-27 NOTE — Consult Note (Signed)
NAME:  Jenny [name], MRN:  [id number], DOB:  1945/08/30, LOS: 7 ADMISSION DATE:  05/19/2023, CONSULTATION DATE:  05/27/23 REFERRING MD:  Dr. Fran Lowes, CHIEF COMPLAINT:  Hypoxia   History of Present Illness:  This is a 77 yo female who presented to [facility] ER on 11/18 via EMS with c/o "not feeling well", nausea, and vomiting.  EMS reported when they arrived at pts home she was lethargic and diaphoretic with generalized weakness.  She received iv fluids en route to the ER.  Pts husband reports the pt has had a cough over the past 2 weeks, however her cough worsened today and she vomited bile prompting EMS notification.   ED Course  Upon arrival to the ER pt hypotensive and tachycardic meeting sepsis criteria.  She received 2L NS bolus/azithromycin/ceftriaxone, however she remained hypotensive requiring levophed gtt.  CXR concerning for pneumonia.  Significant lab results were: K+ 3.1/CO2 18/glucose 177/creatinine 1.08/calcium 8.3/albumin 2.6/AST 168/ALT 56/ troponin 55/lactic acid 6.3/wbc 11.9/hgb 11.1/platelets 65.  PCCM team contacted for ICU admission.     Please see "Significant Hospital Events" section below for full detailed hospital course.   Pertinent  Medical History  Anxiety  Arthritis  CAD  Chronic Systolic CHF Depression  Type II Diabetes Mellitus  HLD Hypothyroidism  IBS Iron Deficiency Anemia  Osteoporosis  Panic Attacks  Severe Aortic Stenosis  Stroke  TIA   Micro Data:  11/18: COVID-19/Respiratory viral panel PCR>>negative 11/18: Strep Pneumo and Legionella urinary antigens>>negative 11/18: Blood culture>> no growth 11/18: Sputum>> specimen not acceptable for testing 11/18: MRSA PCR>>negative 11/20: Legionella urinary antigen>>negative 11/25: COVID-19 PCR>>negative 11/26: Respiratory viral panel>> 11/26: Mycoplasma Pneumoniae antibody>>  Antimicrobials:   Anti-infectives (From admission, onward)    Start     Dose/Rate Route Frequency Ordered Stop   05/27/23  2000  metroNIDAZOLE (FLAGYL) IVPB 500 mg  Status:  Discontinued        500 mg 100 mL/hr over 60 Minutes Intravenous Every 12 hours 05/27/23 0921 05/27/23 1124   05/27/23 1400  piperacillin-tazobactam (ZOSYN) IVPB 3.375 g        3.375 g 12.5 mL/hr over 240 Minutes Intravenous Every 8 hours 05/27/23 1126     05/27/23 1200  ceFEPIme (MAXIPIME) 2 g in sodium chloride 0.9 % 100 mL IVPB  Status:  Discontinued        2 g 200 mL/hr over 30 Minutes Intravenous Every 12 hours 05/27/23 0921 05/27/23 1124   05/27/23 1200  doxycycline (VIBRAMYCIN) 100 mg in dextrose 5 % 250 mL IVPB        100 mg 125 mL/hr over 120 Minutes Intravenous Every 12 hours 05/27/23 1126     05/27/23 0900  Ampicillin-Sulbactam (UNASYN) 3 g in sodium chloride 0.9 % 100 mL IVPB  Status:  Discontinued        3 g 200 mL/hr over 30 Minutes Intravenous Every 12 hours 05/27/23 0741 05/27/23 0920   05/21/23 1800  azithromycin (ZITHROMAX) tablet 500 mg        500 mg Oral Daily-1800 05/21/23 0930 05/23/23 1758   05/19/23 1715  cefTRIAXone (ROCEPHIN) 2 g in sodium chloride 0.9 % 100 mL IVPB        2 g 200 mL/hr over 30 Minutes Intravenous Every 24 hours 05/19/23 1703 05/23/23 2018   05/19/23 1715  azithromycin (ZITHROMAX) 500 mg in dextrose 5 % 250 mL IVPB  Status:  Discontinued        500 mg 250 mL/hr over 60 Minutes Intravenous Every 24 hours  05/19/23 1703 05/21/23 0930         Significant Hospital Events: Including procedures, antibiotic start and stop dates in addition to other pertinent events   11/18: Pt admitted with septic shock secondary to pneumonia requiring levophed gtt  11/21: Hospitalist service assumed care. 11/25: Pt desat on 15L and therefore was switched to heated hf and transferred to stepdown. PCCM team updated on pt's condition and transfer. Surprisingly, pt reported no shortness of breath and was not in distress, however, was intermittently somnolent. CTa Chest negative for PE. 11/26:  Persistent high FiO2  requirements. Concern for recurrent aspiration, CXR with worsening aeration concerning for possible evolving multilobar bilateral bronchopneumonia.  Start empiric Doxycycline and Zoysn. .  High risk for intubation.  PCCM consulted.  Interim History / Subjective:  -Afebrile, Briefly required Levophed overnight, has been weaned off -Requiring 100% FiO2 via HHFNC, desaturates quickly when O2 removed ~ suprisingly awake and alert, no SOB or increased WOB, able to speak in full sentences -CTa Chest yesterday negative for pulmonary embolism -This morning nursing reported concern for possible aspiration, CXR with Worsening aeration in the lungs, favored to reflect evolving severe multilobar bilateral bronchopneumonia, as above.  ~ will add Steroids for aspiration pneumonitis and will start empiric Zosyn to cover anaerobes and for possible HCAP along with Doxycycline for atypical coverage given lack of improvement with aggressive diuresis -Check for Mycoplasma (strep pneumo and legionella previously negative) -Creatinine remains stable with diuresis, UOP 550 cc last 24 hrs (net + 2.9 L) ~ will continue diuresis  Objective   Blood pressure 98/65, pulse 71, temperature 98.5 F (36.9 C), temperature source Axillary, resp. rate (!) 21, height 4\' 8"  (1.422 m), weight 43.5 kg, SpO2 91%.    FiO2 (%):  [60 %-100 %] 100 %   Intake/Output Summary (Last 24 hours) at 05/27/2023 0716 Last data filed at 05/27/2023 0500 Gross per 24 hour  Intake 39.22 ml  Output 1000 ml  Net -960.78 ml   Filed Weights   05/25/23 0500 05/26/23 0403 05/27/23 0340  Weight: 48.2 kg 48.1 kg 43.5 kg    Examination: General: Acute on chronically ill-appearing frail elderly female, laying in bed, on 100% heated high flow nasal cannula, no acute distress HENT: Atraumatic, normocephalic, neck supple, no JVD Lungs: Diminished breath sounds throughout, even, nonlabored Cardiovascular: Regular rate and rhythm, S1-S2, no murmurs, rubs,  gallops Abdomen: Soft, nontender, nondistended, no guarding rebound tenderness, bowel sounds positive x 4 Extremities: Generalized weakness, no deformities, no edema Neuro: Awake and alert, oriented x 3, moves all extremities to command, no focal deficits, speech clear GU: Foley catheter in place draining yellow urine  Resolved Hospital Problem list   Septic Shock  Assessment & Plan:   #Acute Hypoxic Respiratory Failure in the setting of Multifocal Pneumonia and Pulmonary Edema #Concern for recurrent Aspiration CTa Chest 11/25 negative for PE, with severe diffuse bilateral airspace disease with small effusions (edema vs infection) -Supplemental O2 as needed to maintain O2 sats >92% -Pt has previously refused BiPAP ~ High risk for intubation -Follow intermittent Chest X-ray & ABG as needed -Bronchodilators  -IV Steroids for aspiration pneumonitis -ABX as above -Diuresis as BP and renal function permits ~ currently on 40 mg IV Lasix BID -Pulmonary toilet as able  #Septic shock- PRESENT ON ADMISSION ~ RESOLVED #Elevated troponin suspect secondary to demand ischemia vs NSTEMI #Acute on Chronic systolic CHF  Hx: CAD, HLD, Stroke, Severe Aortic Stenosis, and TIA Echocardiogram 05/20/23: LVEF 55-60%, unable to evaluate diastolic parameters,  RV systolic function normal, normal pulmonary artery systolic pressure, moderate MR & MS, moderate TR, moderate PR -Continuous cardiac monitoring -Maintain MAP >65 -Cautious IV fluids -Vasopressors as needed to maintain MAP goal ~ currently weaned off -Lactic acid is normalized -HS Troponin peaked at 848 -Diuresis as BP and renal function permits -Seen by Cardiology: considering ischemic workup following recovery as an outpatient  #Concern for recurrent aspiration #Multifocal Pneumonia -Monitor fever curve -Trend WBC's & Procalcitonin -Follow cultures as above -Completed course of ABX earlier in stay -Given lack of clinical improvement, Start  empiric Doxycycline and Zoysn pending cultures & sensitivities -Speech evaluation  #Mild AKI #Hyponatremia -Monitor I&O's / urinary output -Follow BMP -Ensure adequate renal perfusion -Avoid nephrotoxic agents as able -Replace electrolytes as indicated ~ Pharmacy following for assistance with electrolyte replacement  #Anemia #Acute on Chronic Thrombocytopenia PMHx: Iron deficiency anemia -Monitor for S/Sx of bleeding -Trend CBC -Heparin SQ for VTE Prophylaxis  -Transfuse for Hgb <7 -Transfuse platelets for platelets count of <10K, count <50 K with active bleeding        Best Practice (right click and "Reselect all SmartList Selections" daily)   Diet/type: dysphagia diet (see orders) DVT prophylaxis: prophylactic heparin  GI prophylaxis: PPI Lines: Central line and yes and it is still needed (PICC Line) Foley:  Yes, and it is still needed Code Status:  full code Last date of multidisciplinary goals of care discussion [11/26]  11/26: Pt updated on plan of care.  Labs   CBC: Recent Labs  Lab 05/21/23 0308 05/22/23 0523 05/23/23 0509 05/24/23 0407 05/25/23 0428 05/26/23 0400 05/27/23 0339  WBC 12.3*   < > 7.4 6.2 6.4 7.3 5.5  NEUTROABS 10.6*  --   --   --   --   --   --   HGB 9.6*   < > 10.6* 11.2* 10.4* 9.5* 8.5*  HCT 28.5*   < > 31.1* 32.1* 29.7* 27.0* 25.3*  MCV 88.8   < > 86.1 84.3 84.6 84.6 89.1  PLT 70*   < > 79* 83* 89* 100* 90*   < > = values in this interval not displayed.    Basic Metabolic Panel: Recent Labs  Lab 05/21/23 0308 05/22/23 0523 05/23/23 0509 05/24/23 0407 05/25/23 0428 05/26/23 0400 05/27/23 0339  NA 136 136 136 131* 131* 131* 133*  K 4.6 4.0 3.5 3.3* 3.7 3.7 3.6  CL 110 106 107 101 102 103 103  CO2 21* 20* 20* 20* 21* 22 20*  GLUCOSE 95 80 113* 158* 162* 146* 377*  BUN 32* 34* 26* 23 32* 43* 54*  CREATININE 1.12* 1.05* 0.87 0.83 0.96 0.92 1.08*  CALCIUM 7.4* 7.5* 7.7* 7.4* 7.2* 7.0* 7.1*  MG 2.6* 2.5*  --   --  1.7 1.8  1.7  PHOS 3.0 2.0* 1.5* 2.4* 3.3  --   --    GFR: Estimated Creatinine Clearance: 26.2 mL/min (A) (by C-G formula based on SCr of 1.08 mg/dL (H)). Recent Labs  Lab 05/21/23 0308 05/22/23 0523 05/24/23 0407 05/25/23 0428 05/26/23 0400 05/26/23 0955 05/26/23 1909 05/26/23 2138 05/27/23 0339  PROCALCITON 81.93  --   --   --   --  6.44  --   --   --   WBC 12.3*   < > 6.2 6.4 7.3  --   --   --  5.5  LATICACIDVEN  --   --   --  1.6  --   --  2.0* 1.7  --    < > =  values in this interval not displayed.    Liver Function Tests: Recent Labs  Lab 05/23/23 0509 05/24/23 0407  AST 82* 49*  ALT 112* 75*  ALKPHOS 131* 103  BILITOT 1.1 0.8  PROT 5.9* 5.7*  ALBUMIN 2.3* 2.3*   No results for input(s): "LIPASE", "AMYLASE" in the last 168 hours. No results for input(s): "AMMONIA" in the last 168 hours.  ABG    Component Value Date/Time   PHART 7.38 05/26/2023 1845   PCO2ART 37 05/26/2023 1845   PO2ART 66 (L) 05/26/2023 1845   HCO3 21.9 05/26/2023 1845   TCO2 25 02/19/2017 1657   ACIDBASEDEF 2.8 (H) 05/26/2023 1845   O2SAT 93.2 05/26/2023 1845     Coagulation Profile: No results for input(s): "INR", "PROTIME" in the last 168 hours.  Cardiac Enzymes: No results for input(s): "CKTOTAL", "CKMB", "CKMBINDEX", "TROPONINI" in the last 168 hours.  HbA1C: Hemoglobin A1C  Date/Time Value Ref Range Status  02/19/2021 03:03 PM 5.6 4.0 - 5.6 % Final  10/19/2020 03:49 PM 6.2 (A) 4.0 - 5.6 % Final  10/23/2014 05:40 AM 8.6 (H) % Final    Comment:    4.0-6.0 NOTE: New Reference Range  09/06/14    HbA1c POC (<> result, manual entry)  Date/Time Value Ref Range Status  03/19/2022 02:57 PM 6.0 4.0 - 5.6 % Final   Hgb A1c MFr Bld  Date/Time Value Ref Range Status  05/19/2023 09:39 PM 5.3 4.8 - 5.6 % Final    Comment:    (NOTE) Pre diabetes:          5.7%-6.4%  Diabetes:              >6.4%  Glycemic control for   <7.0% adults with diabetes   05/23/2020 03:14 PM 5.6 4.8 - 5.6  % Final    Comment:             Prediabetes: 5.7 - 6.4          Diabetes: >6.4          Glycemic control for adults with diabetes: <7.0     CBG: Recent Labs  Lab 05/26/23 1222 05/26/23 1749 05/26/23 2105 05/27/23 0411 05/27/23 0537  GLUCAP 158* 180* 190* 333* 270*    Review of Systems:   Positives in BOLD: Currently denies all complaints Gen: Denies fever, chills, weight change, fatigue, night sweats HEENT: Denies blurred vision, double vision, hearing loss, tinnitus, sinus congestion, rhinorrhea, sore throat, neck stiffness, dysphagia PULM: Denies shortness of breath, cough, sputum production, hemoptysis, wheezing CV: Denies chest pain, edema, orthopnea, paroxysmal nocturnal dyspnea, palpitations GI: Denies abdominal pain, nausea, vomiting, diarrhea, hematochezia, melena, constipation, change in bowel habits GU: Denies dysuria, hematuria, polyuria, oliguria, urethral discharge Endocrine: Denies hot or cold intolerance, polyuria, polyphagia or appetite change Derm: Denies rash, dry skin, scaling or peeling skin change Heme: Denies easy bruising, bleeding, bleeding gums Neuro: Denies headache, numbness, weakness, slurred speech, loss of memory or consciousness   Past Medical History:  She,  has a past medical history of Anxiety, Arthritis, CAD (coronary artery disease), Depression, Diabetes mellitus with complication (HCC), Heart failure with improved ejection fraction (HFimpEF) (HCC), Hyperlipidemia, Hypothyroidism, IBS (irritable bowel syndrome), Iron deficiency anemia, Microcytic anemia, Osteoporosis, Panic attacks, Severe aortic stenosis, Stroke (HCC) (2001), and TIA (transient ischemic attack) (2006).   Surgical History:   Past Surgical History:  Procedure Laterality Date   ABDOMINAL HYSTERECTOMY     AORTIC VALVE REPLACEMENT N/A 02/17/2017   Procedure: AORTIC VALVE REPLACEMENT (AVR);  Surgeon: Donata Clay, Theron Arista, MD;  Location: [facility] OR;  Service: Open Heart Surgery;   Laterality: N/A;   CATARACT EXTRACTION W/PHACO Right 04/09/2016   Procedure: CATARACT EXTRACTION PHACO AND INTRAOCULAR LENS PLACEMENT (IOC);  Surgeon: Galen Manila, MD;  Location: [facility] ORS;  Service: Ophthalmology;  Laterality: Right;  [place] 57.2 AP% 18.6 CDE 10.64 Fluid Pack lot # [id number]   CATARACT EXTRACTION W/PHACO Left 12/29/2018   Procedure: CATARACT EXTRACTION PHACO AND INTRAOCULAR LENS PLACEMENT (IOC)  LEFT DIABETIC;  Surgeon: Galen Manila, MD;  Location: [facility];  Service: Ophthalmology;  Laterality: Left;  Diabetic - insuli and oral meds   CHOLECYSTECTOMY     COLONOSCOPY     CORONARY ANGIOGRAPHY N/A 02/03/2017   Procedure: CORONARY ANGIOGRAPHY;  Surgeon: Iran Ouch, MD;  Location: [facility] INVASIVE CV LAB;  Service: Cardiovascular;  Laterality: N/A;   FRACTURE SURGERY     LEFT ANKLE   RIGHT AND LEFT HEART CATH Bilateral 02/03/2017   Procedure: Right and Left Heart Cath;  Surgeon: Iran Ouch, MD;  Location: [facility] INVASIVE CV LAB;  Service: Cardiovascular;  Laterality: Bilateral;   TEE WITHOUT CARDIOVERSION N/A 02/17/2017   Procedure: TRANSESOPHAGEAL ECHOCARDIOGRAM (TEE);  Surgeon: Donata Clay, Theron Arista, MD;  Location: [facility] OR;  Service: Open Heart Surgery;  Laterality: N/A;   TIBIA IM NAIL INSERTION Left 05/19/2018   Procedure: INTRAMEDULLARY (IM) NAIL TIBIAL;  Surgeon: Juanell Fairly, MD;  Location: [facility] ORS;  Service: Orthopedics;  Laterality: Left;     Social History:   reports that she quit smoking about 23 years ago. Her smoking use included cigarettes. She started smoking about 53 years ago. She has never used smokeless tobacco. She reports that she does not drink alcohol and does not use drugs.   Family History:  Her family history includes Alzheimer's disease in her mother; Cancer in her maternal grandfather and maternal grandmother; Diabetes in her brother and father; Hypertension in her mother; Parkinson's disease in her mother. There is no history of  Neuropathy.   Allergies Allergies  Allergen Reactions   Iodine Swelling    ANGIOEDEMA FACIAL SWELLING "BETADINE OKAY"   Shellfish Allergy Anaphylaxis     Home Medications  Prior to Admission medications   Medication Sig Start Date End Date Taking? Authorizing Provider  ALPRAZolam Prudy Feeler) 0.5 MG tablet Take 1 tablet (0.5 mg total) by mouth 2 (two) times daily. 07/03/22  Yes Masoud, Renda Rolls, MD  donepezil (ARICEPT) 10 MG tablet Take 1 tablet (10 mg total) by mouth at bedtime. 05/19/23  Yes Lomax, Amy, NP  hydrOXYzine (ATARAX/VISTARIL) 25 MG tablet Take 25 mg by mouth 2 (two) times daily.    Yes [provider]  insulin glargine (LANTUS) 100 UNIT/ML injection Inject 0.25-0.35 mLs (25-35 Units total) into the skin See admin instructions. 35 units every morning and 25 units at bedtime 07/03/22  Yes Masoud, Renda Rolls, MD  levocetirizine (XYZAL) 5 MG tablet TAKE 1 TABLET(5 MG) BY MOUTH EVERY EVENING 08/14/21  Yes Masoud, Renda Rolls, MD  levothyroxine (SYNTHROID) 88 MCG tablet TAKE 1 TABLET BY MOUTH DAILY 08/28/21  Yes Masoud, Renda Rolls, MD  Loperamide HCl (IMODIUM A-D PO) Take by mouth as needed.   Yes [provider]  Melatonin 10 MG TABS Take by mouth at bedtime.   Yes [provider]  metFORMIN (GLUCOPHAGE) 500 MG tablet Take 1 tablet (500 mg total) by mouth 2 (two) times daily. 10/01/21  Yes Masoud, Renda Rolls, MD  rosuvastatin (CRESTOR) 10 MG tablet TAKE 1 TABLET BY MOUTH DAILY 03/28/22  Yes Corky Downs, MD  sertraline (ZOLOFT) 100 MG tablet TAKE 1 TABLET(100 MG) BY MOUTH TWICE DAILY 05/13/22  Yes Kara Dies, NP  sitaGLIPtin (JANUVIA) 100 MG tablet TAKE 1 TABLET BY MOUTH EVERY MORNING 07/02/22  Yes Masoud, Renda Rolls, MD  alendronate (FOSAMAX) 70 MG tablet Take 1 tablet (70 mg total) by mouth once a week. Take with a full glass of water on an empty stomach. 05/27/22   Corky Downs, MD  Cholecalciferol (VITAMIN D3 PO) Take by mouth daily.    [provider]  Cranberry-Vitamin C  (AZO CRANBERRY URINARY TRACT) 250-60 MG CAPS Take 1 each by mouth 2 (two) times daily. 04/30/22   Corky Downs, MD  glucose blood (ONETOUCH ULTRA) test strip TEST BLOOD SUGAR THREE TIMES DAILY 11/02/21   Corky Downs, MD  Insulin Syringe-Needle U-100 (INSULIN SYRINGE .5CC/31GX5/16") 31G X 5/16" 0.5 ML MISC USE 1 NEEDLE TWICE DAILY 02/18/22   Corky Downs, MD  Lancets [facility] DELICA PLUS LANCET30G) MISC USE TO TEST BLOOD SUGAR TWICE DAILY 06/27/22   Corky Downs, MD     Critical care time: 60 minutes     Harlon Ditty, AGACNP-BC  Pulmonary & Critical Care Prefer epic messenger for cross cover needs If after hours, please call E-link

## 2023-05-27 NOTE — Progress Notes (Signed)
PROGRESS NOTE    Jenny [name]  JOA:[id number] DOB: 12/10/1945 DOA: 05/19/2023 PCP: Corky Downs, MD  [id number]/[id number]  LOS: 7 days   Brief hospital course:   Assessment & Plan: 77 yo female who presented to [facility] ER on 11/18 via EMS with c/o "not feeling well", nausea, and vomiting. EMS reported when they arrived at pts home she was lethargic and diaphoretic with generalized weakness. She received iv fluids en route to the ER. Pts husband reports the pt has had a cough over the past 2 weeks, however her cough worsened today and she vomited bile prompting EMS notification.   Upon arrival to the ER pt hypotensive and tachycardic meeting sepsis criteria. She received 2L NS bolus/azithromycin/ceftriaxone, however she remained hypotensive requiring levophed gtt. CXR concerning for pneumonia.  PCCM team contacted for ICU admission.   Pt was transferred to hospitalist service on 05/22/23.  #Acute respiratory failure with hypoxia  --found to have PNA on presentation and completed a course of abx with ceftriaxone and azithromycin.  Supplemental oxygen was weaned down to 1L at one point, but requirement went up, and now needed heated hf at almost max levels. --CXR showed likely pulm edema.  Was started on IV lasix 40 BID --CTA chest neg for PE, and again showed "Severe diffuse bilateral airspace disease with small effusions. Findings could reflect edema or infection."  Pt had finished a course of abx recently, no fever, no leukocytosis, procal trending down, however given that respiratory status has not improved with diuresis, will start abx again covering aspiration PNA. Plan: --diurese --start PNA treatment again with zosyn and doxy --Continue supplemental O2 to keep sats >=92% --consult PCCM  PNA --HCAP vs aspiration vs atypical --completed 5 days of ceftriaxone and azithromycin, but maybe PNA was not completed treated. Plan: --start zosyn and doxy today --start IV solumedrol for  aspiration pneumonitis, per PCCM  Pulm edema --CXR showed "widespread pulmonary interstitial opacity" likely pulm edema.   --IV lasix 40 mg BID started on 11/24, however, could not get accurate measurement of urine output, so Foley was inserted.   Plan: --cont IV lasix 40 BID for now --Foley to monitor urine output  Hypotension --BP intermittent low to 80's. --cont low-dose pressor to improve BP and help with diuresis.    #Septic shock- PRESENT ON ADMISSION - DUE TO PNEUMONIA --hypotensive with lactic acidosis on arrival  --off pressor morning of 11/20, resumed on 11/25 as PRN.  NSTEMI --trop peaked at 848.  In the setting of respiratory distress, pneumonia, sepsis. --cardio consulted --Normal ejection fraction on echo  -Consider ischemic workup following recovery, could be arranged as outpatient   Aortic valve stenosis status post bioprosthetic aortic valve  --no issue  # Chronic systolic CHF, ruled out --current Echo showed normal LVEF --started on Toprol which is held currently due to hypotension.  Hx: CAD, HLD, Stroke --cont ASA --resume statin after discharge   # Mild acute kidney injury secondary ATN  --Cr peaked at 1.27.   --monitor Cr while diuresing  #Metabolic acidosis  #Lactic acidosis   #Hypokalemia  - monitor and supplement PRN  Hypophos --monitor and supplement PRN   #Transaminitis --trended down   # Hx of Iron deficiency anemia  #Acute on chronic thrombocytopenia  - Trend CBC  - Monitor for s/sx of bleeding  - Transfuse for hgb <7   #Type II diabetes mellitus  - Hemoglobin A1c only 5.3  --ACHS and SSI for now  # IBS with diarrhea --pt reported at baseline,  she takes Imodium for her diarrhea, minimum 2 mg x5 tabs per day. --multiple episodes of diarrhea noted, C diff and GI path neg.  Rectal tube inserted on 11/21.  Received Imodium and still having liquid stool --cont Lomotil to 2 tabs QID   DVT prophylaxis: Heparin SQ Code Status:  Full code  Family Communication:  Level of care: Stepdown Dispo:   The patient is from: home Anticipated d/c is to: SNF rehab Anticipated d/c date is: > 3 days   Subjective and Interval History:  Pt was noted to be coughing after fluid intake.  SLP eval rec thickened fluids for now.    Pt continued to have high O2 requirement on heated hf, but still did not show or feel distress.   Objective: Vitals:   05/27/23 1605 05/27/23 1646 05/27/23 1700 05/27/23 1800  BP:  (!) 119/46 98/83 (!) 117/43  Pulse: 82 87 85 79  Resp:      Temp:  97.8 F (36.6 C)    TempSrc:  Axillary    SpO2: 96% (!) 88% 91% 95%  Weight:      Height:        Intake/Output Summary (Last 24 hours) at 05/27/2023 1849 Last data filed at 05/27/2023 1825 Gross per 24 hour  Intake 459.28 ml  Output 2050 ml  Net -1590.72 ml   Filed Weights   05/25/23 0500 05/26/23 0403 05/27/23 0340  Weight: 48.2 kg 48.1 kg 43.5 kg    Examination:   Constitutional: NAD, alert, confused with time and events HEENT: conjunctivae and lids normal, EOMI CV: No cyanosis.   RESP: normal respiratory effort, heated hf SKIN: warm, dry Neuro: II - XII grossly intact.    Rectal tube with liquid stool   Data Reviewed: I have personally reviewed labs and imaging studies  Time spent: 50 minutes  Darlin Priestly, MD [facility] If 7PM-7AM, please contact night-coverage 05/27/2023, 6:49 PM

## 2023-05-27 NOTE — TOC Progression Note (Signed)
Transition of Care [facility]) - Progression Note    Patient Details  Name: Jenny [name] MRN: [id number] Date of Birth: 09/12/1945  Transition of Care [facility]) CM/SW Contact  Truddie Hidden, RN Phone Number: 05/27/2023, 3:37 PM  Clinical Narrative:    Spoke with patient's spouse regarding bed offers for [facility], [facility], and [facility]. Patient has chosen [employer] and [address].    Expected Discharge Plan: Skilled Nursing Facility    Expected Discharge Plan and Services                                               Social Determinants of Health (SDOH) Interventions SDOH Screenings   Food Insecurity: No Food Insecurity (05/20/2023)  Housing: Patient Declined (05/20/2023)  Transportation Needs: No Transportation Needs (05/20/2023)  Utilities: Not At Risk (05/20/2023)  Alcohol Screen: Low Risk  (05/18/2021)  Depression (PHQ2-9): Low Risk  (12/11/2021)  Financial Resource Strain: Low Risk  (05/18/2021)  Physical Activity: Insufficiently Active (05/18/2021)  Social Connections: Socially Integrated (05/18/2021)  Stress: No Stress Concern Present (05/18/2021)  Tobacco Use: Medium Risk (05/19/2023)    Readmission Risk Interventions     No data to display

## 2023-05-27 NOTE — Progress Notes (Signed)
PHARMACY CONSULT NOTE - FOLLOW UP  Pharmacy Consult for Electrolyte Monitoring and Replacement   Recent Labs: Potassium (mmol/L)  Date Value  05/27/2023 3.6  10/24/2014 4.4   Magnesium (mg/dL)  Date Value  78/29/5621 1.7   Calcium (mg/dL)  Date Value  30/86/5784 7.1 (L)   Calcium, Total (mg/dL)  Date Value  69/62/9528 8.6 (L)   Albumin (g/dL)  Date Value  41/32/4401 2.3 (L)  05/23/2020 3.9  03/02/2012 3.2 (L)   Phosphorus (mg/dL)  Date Value  02/72/5366 3.3   Sodium (mmol/L)  Date Value  05/27/2023 133 (L)  05/23/2020 138  10/24/2014 134 (L)     Assessment: 77 y/o female with h/o severe aortic stenosis, CHF, DM, TIA/stroke, mood disorder, IBS, hypothyroidism, CAD, HLD, HTN, RA and anxiety who is admitted with PNA, sepsis, shock and AKI.   Diuretics: furosemide 40 mg IV BID  Goal of Therapy:  Electrolytes WNL  Plan:  ---2 grams IV magnesium sulfate x 1 ---40 mEq po KCl x 1 ---recheck electrolytes in am  Lowella Bandy ,PharmD Clinical Pharmacist 05/27/2023 7:19 AM

## 2023-05-27 NOTE — Evaluation (Signed)
Clinical/Bedside Swallow Evaluation Patient Details  Name: Jenny [name] MRN: [id number] Date of Birth: Jul 03, 1945  Today's Date: 05/27/2023 Time: SLP Start Time (ACUTE ONLY): 1400 SLP Stop Time (ACUTE ONLY): 1455 SLP Time Calculation (min) (ACUTE ONLY): 55 min  Past Medical History:  Past Medical History:  Diagnosis Date   Anxiety    Arthritis    CAD (coronary artery disease)    a. 01/2017 Cath: nonobs dzs.   Depression    Diabetes mellitus with complication (HCC)    Tylenol   Heart failure with improved ejection fraction (HFimpEF) (HCC)    a. TTE 12/2016, EF 45-50%; b. 05/2023 Echo: EF 55-60%, mild LVH, nl RV fxn, mod dil LA, mod MR/MS, mod TR, sev thickening of AoV w/ mild AS.   Hyperlipidemia    Hypothyroidism    IBS (irritable bowel syndrome)    Iron deficiency anemia    a. s/p pRBC x1 in 12/2016   Microcytic anemia    Osteoporosis    Panic attacks    Severe aortic stenosis    a. TTE 12/2016: possibly bicuspid aortic valve that was moderately thickened with severely calcified leaflets. There was severe aortic stenosis with a mean gradient of 47 mmHg and valve area of 0.52 cm; b. 01/2017 s/p AVR w/ 19 mm bioprosthetic Magna Ease pericardial tissue valve, ser# [id number].   Stroke [facility]) 2001   TIA (transient ischemic attack) 2006   Past Surgical History:  Past Surgical History:  Procedure Laterality Date   ABDOMINAL HYSTERECTOMY     AORTIC VALVE REPLACEMENT N/A 02/17/2017   Procedure: AORTIC VALVE REPLACEMENT (AVR);  Surgeon: Donata Clay, Theron Arista, MD;  Location: [facility] OR;  Service: Open Heart Surgery;  Laterality: N/A;   CATARACT EXTRACTION W/PHACO Right 04/09/2016   Procedure: CATARACT EXTRACTION PHACO AND INTRAOCULAR LENS PLACEMENT (IOC);  Surgeon: Galen Manila, MD;  Location: [facility] ORS;  Service: Ophthalmology;  Laterality: Right;  [place] 57.2 AP% 18.6 CDE 10.64 Fluid Pack lot # [id number]   CATARACT EXTRACTION W/PHACO Left 12/29/2018   Procedure: CATARACT EXTRACTION PHACO AND  INTRAOCULAR LENS PLACEMENT (IOC)  LEFT DIABETIC;  Surgeon: Galen Manila, MD;  Location: [facility];  Service: Ophthalmology;  Laterality: Left;  Diabetic - insuli and oral meds   CHOLECYSTECTOMY     COLONOSCOPY     CORONARY ANGIOGRAPHY N/A 02/03/2017   Procedure: CORONARY ANGIOGRAPHY;  Surgeon: Iran Ouch, MD;  Location: [facility] INVASIVE CV LAB;  Service: Cardiovascular;  Laterality: N/A;   FRACTURE SURGERY     LEFT ANKLE   RIGHT AND LEFT HEART CATH Bilateral 02/03/2017   Procedure: Right and Left Heart Cath;  Surgeon: Iran Ouch, MD;  Location: [facility] INVASIVE CV LAB;  Service: Cardiovascular;  Laterality: Bilateral;   TEE WITHOUT CARDIOVERSION N/A 02/17/2017   Procedure: TRANSESOPHAGEAL ECHOCARDIOGRAM (TEE);  Surgeon: Donata Clay, Theron Arista, MD;  Location: [facility] OR;  Service: Open Heart Surgery;  Laterality: N/A;   TIBIA IM NAIL INSERTION Left 05/19/2018   Procedure: INTRAMEDULLARY (IM) NAIL TIBIAL;  Surgeon: Juanell Fairly, MD;  Location: [facility] ORS;  Service: Orthopedics;  Laterality: Left;   HPI:  Pt is a 77 y.o. female w/ past medical history chronic kidney pain, CAD, CHF, DMII, anxiety/depression, Panic attacks, stroke, IBS, and neuropsychiatric disorder.  She presented to [facility] ER on 11/18 via EMS with c/o "not feeling well", nausea, and vomiting.  EMS reported when they arrived at pts home she was lethargic and diaphoretic with generalized weakness.  She received iv fluids en route to the  ER.  Pt's husband reports the pt has had a cough over the past 2 weeks, however her cough worsened today and she vomited bile prompting EMS notification.  ED Course   Upon arrival to the ER pt hypotensive and tachycardic meeting sepsis criteria.   Imaging: Worsening aeration, generalized appearance and rapid progression  favoring edema. There is asymmetric left-sided airspace disease and  pneumonia may be coexistent.  2. Background of chronic lung disease and mild fibrosis by prior  chest CT imaging.   OF NOTE: on 11/25: pt reported no shortness of breath and was not in distress; however, CXR with worsening aeration in the lungs, favored to reflect evolving severe multilobar bilateral bronchopneumonia, as above. MD started empiric Zosyn to cover concern for aspiration, anaerobes, and for possible HCAP along with Doxycycline for atypical coverage given lack of improvement with aggressive diuresis, check for mycoplasma.   CXR: Unresolved course and widespread pulmonary interstitial opacity, new  since 05/19/2023. Top differential considerations include pulmonary  edema and bilateral viral/atypical pneumonia. No pleural effusion is  evident. No new cardiopulmonary abnormality.  F/u CXR: Worsening aeration in the lungs, favored to reflect evolving  severe multilobar bilateral bronchopneumonia.    Assessment / Plan / Recommendation  Clinical Impression   Pt seen for repeat BSE s/p transfer to CCU. Pt awake, verbally responded. Pt required min verbal cues for follow through w/ tasks. Noted min distracted at times. Pt on HFNC O2 support 15L now w/ transfer back into the CCU d/t decline in Pulmonary status in last day. Full support given for positioning and setup, then pt was able to feed herself. Plastic utensils provided for ease of self-feeding(less heavy) as she requested. Pt required min verbal cues for redirection and follow through w/ tasks. Pt Edentulous. No congested cough. On HFNC O2 support 45L; afebrile. WBC WNL - has not had a decline in such in recent ~5 days per chart.   Pt appears to present w/ grossly functional oropharyngeal phase swallowing w/ a modified diet/food consistency(Purees, Nectar consistency liquids) w/ No overt neuromuscular deficits nor dysphagia noted. Pt consumed the po trials w/ No overt, clinical s/s of aspiration during po trials.  Pt has factors that can increase risk for aspiration/aspiration pneumonia including significant Pulmonary decline w/ need for increased O2 support  BUT appears at reduced risk for aspiration/aspiration pneumonia when following general aspiration precautions w/ a modified dysphagia diet consistency; Supervision. Pt continues to require feeding support and encouragement for sitting upright AND eating/drinking and reduced distractions in her environment.   During po trials, pt consumed consistencies of purees and Nectar liquids via cup w/ No overt coughing, decline in vocal quality, or change in respiratory presentation during/post trials. O2 sats remained 97%. Oral phase appeared [facility] w/ timely bolus management and control of bolus propulsion for A-P transfer for swallowing of liquids and purees. Oral clearing achieved w/ all trials, alternating foods/liquids recommended.  Pt helped to feed self for drinking and food trials w/ plastic spoon -- this decreases risk for aspiration when drinking. Rest Breaks given b/t bites/sips as needed - no significant increase in RR during session(19-25).    Recommend Pureed consistency diet w/ well-moistened foods for conservation of energy; Nectar consistency liquids. Pt should Hold Cup when drinking. Recommend general aspiration precautions, reduce distractions during meals, talking. Must sit fully upright for any po's. Rest Breaks as needed to lessen any increased WOB/SOB w/ exertion of po intake. Support feeding as needed d/t overall weakness and illness. Pills CRUSHED vs  WHOLE in Puree for safer, easier swallowing.   Education given on pt's swallowing as currently impacted by her Pulmonary decline; food consistency recommended; general aspiration precautions to pt/Husband and NSG.  Recommend f/u w/ Palliative Care; Dietician. This diet will be beneficial while Pulmonary status remains declined. NSG updated, agreed. MD updated.  SLP Visit Diagnosis: Dysphagia, unspecified (R13.10) (Declined pulmonary status on increased O2 support; Edentulous and weak; Esophageal phase dysmotility suspected(belching+); reduced oral  intake at Baseline per history/Husband's report)    Aspiration Risk  Mild aspiration risk;Risk for inadequate nutrition/hydration    Diet Recommendation   Nectar;Dysphagia 1 (puree) = Pureed consistency diet w/ well-moistened foods for conservation of energy; Nectar consistency liquids. Pt should Hold Cup when drinking. Recommend general aspiration precautions, reduce distractions during meals, talking. Must sit fully upright for any po's. Rest Breaks as needed to lessen any increased WOB/SOB w/ exertion of po intake. Support feeding as needed d/t overall weakness and illness.    Medication Administration: Crushed with puree    Other  Recommendations Recommended Consults: Consider GI evaluation (Pulmonology; Dietician; Palliative Care) Oral Care Recommendations: Oral care BID;Oral care before and after PO;Staff/trained caregiver to provide oral care;Oral care QID Caregiver Recommendations: Avoid jello, ice cream, thin soups, popsicles;Remove water pitcher;Have oral suction available    Recommendations for follow up therapy are one component of a multi-disciplinary discharge planning process, led by the attending physician.  Recommendations may be updated based on patient status, additional functional criteria and insurance authorization.  Follow up Recommendations Follow physician's recommendations for discharge plan and follow up therapies (TBD)      Assistance Recommended at Discharge  FULL  Functional Status Assessment Patient has had a recent decline in their functional status and demonstrates the ability to make significant improvements in function in a reasonable and predictable amount of time.  Frequency and Duration min 2x/week  2 weeks       Prognosis Prognosis for improved oropharyngeal function: Fair Barriers to Reach Goals: Time post onset;Severity of deficits;Behavior Barriers/Prognosis Comment: Pulmonary decline; baseline issues w/ reduced oral intake; Edentulous status; N/V       Swallow Study   General Date of Onset: 05/19/23 HPI: Pt is a 77 y.o. female w/ past medical history chronic kidney pain, CAD, CHF, DMII, anxiety/depression, Panic attacks, stroke, IBS, and neuropsychiatric disorder.  She presented to [facility] ER on 11/18 via EMS with c/o "not feeling well", nausea, and vomiting.  EMS reported when they arrived at pts home she was lethargic and diaphoretic with generalized weakness.  She received iv fluids en route to the ER.  Pt's husband reports the pt has had a cough over the past 2 weeks, however her cough worsened today and she vomited bile prompting EMS notification.  ED Course   Upon arrival to the ER pt hypotensive and tachycardic meeting sepsis criteria.   Imaging: Worsening aeration, generalized appearance and rapid progression  favoring edema. There is asymmetric left-sided airspace disease and  pneumonia may be coexistent.  2. Background of chronic lung disease and mild fibrosis by prior  chest CT imaging.  OF NOTE: on 11/25: pt reported no shortness of breath and was not in distress; however, CXR with worsening aeration in the lungs, favored to reflect evolving severe multilobar bilateral bronchopneumonia, as above. MD started empiric Zosyn to cover concern for aspiration, anaerobes, and for possible HCAP along with Doxycycline for atypical coverage given lack of improvement with aggressive diuresis, check for mycoplasma.   CXR: Unresolved course and widespread pulmonary  interstitial opacity, new  since 05/19/2023. Top differential considerations include pulmonary  edema and bilateral viral/atypical pneumonia. No pleural effusion is  evident. No new cardiopulmonary abnormality.  F/u CXR: Worsening aeration in the lungs, favored to reflect evolving  severe multilobar bilateral bronchopneumonia. Type of Study: Bedside Swallow Evaluation Previous Swallow Assessment: 05/20/23 Diet Prior to this Study: NPO (had been on a dys. 2 w/ thins w/out reported difficulty by  Staff for ~5 days) Temperature Spikes Noted: No (wbc 5.5) Respiratory Status: Nasal cannula (HFNC 45L; 65%) History of Recent Intubation: No Behavior/Cognition: Alert;Cooperative;Pleasant mood;Distractible;Requires cueing Oral Cavity Assessment: Dry Oral Care Completed by SLP: Yes Oral Cavity - Dentition: Edentulous Vision: Functional for self-feeding Self-Feeding Abilities: Able to feed self;Needs assist;Needs set up Patient Positioning: Upright in bed (supported) Baseline Vocal Quality: Normal Volitional Cough: Strong Volitional Swallow: Able to elicit    Oral/Motor/Sensory Function Overall Oral Motor/Sensory Function: Within functional limits   Ice Chips Ice chips: Not tested   Thin Liquid Thin Liquid: Not tested    Nectar Thick Nectar Thick Liquid: Within functional limits Presentation: Cup;Self Fed (8 trials)   Honey Thick Honey Thick Liquid: Not tested   Puree Puree: Within functional limits Presentation: Self Fed;Spoon (8 trials)   Solid     Solid: Not tested        Jerilynn Som, MS, CCC-SLP Speech Language Pathologist Rehab Services; [facility] -  [phone] (ascom) Sabatino Williard 05/27/2023,4:28 PM

## 2023-05-28 ENCOUNTER — Inpatient Hospital Stay: Payer: Medicare Other

## 2023-05-28 DIAGNOSIS — I350 Nonrheumatic aortic (valve) stenosis: Secondary | ICD-10-CM | POA: Diagnosis not present

## 2023-05-28 DIAGNOSIS — J811 Chronic pulmonary edema: Secondary | ICD-10-CM | POA: Diagnosis not present

## 2023-05-28 DIAGNOSIS — E876 Hypokalemia: Secondary | ICD-10-CM | POA: Diagnosis not present

## 2023-05-28 DIAGNOSIS — Z515 Encounter for palliative care: Secondary | ICD-10-CM

## 2023-05-28 DIAGNOSIS — A419 Sepsis, unspecified organism: Secondary | ICD-10-CM | POA: Diagnosis not present

## 2023-05-28 DIAGNOSIS — J9601 Acute respiratory failure with hypoxia: Secondary | ICD-10-CM | POA: Diagnosis not present

## 2023-05-28 DIAGNOSIS — R6521 Severe sepsis with septic shock: Secondary | ICD-10-CM | POA: Diagnosis not present

## 2023-05-28 DIAGNOSIS — E43 Unspecified severe protein-calorie malnutrition: Secondary | ICD-10-CM | POA: Diagnosis not present

## 2023-05-28 DIAGNOSIS — N179 Acute kidney failure, unspecified: Secondary | ICD-10-CM | POA: Diagnosis not present

## 2023-05-28 DIAGNOSIS — K7460 Unspecified cirrhosis of liver: Secondary | ICD-10-CM | POA: Diagnosis not present

## 2023-05-28 LAB — CBC
HCT: 25.5 % — ABNORMAL LOW (ref 36.0–46.0)
Hemoglobin: 8.7 g/dL — ABNORMAL LOW (ref 12.0–15.0)
MCH: 29.7 pg (ref 26.0–34.0)
MCHC: 34.1 g/dL (ref 30.0–36.0)
MCV: 87 fL (ref 80.0–100.0)
Platelets: 115 10*3/uL — ABNORMAL LOW (ref 150–400)
RBC: 2.93 MIL/uL — ABNORMAL LOW (ref 3.87–5.11)
RDW: 15.8 % — ABNORMAL HIGH (ref 11.5–15.5)
WBC: 10.1 10*3/uL (ref 4.0–10.5)
nRBC: 0 % (ref 0.0–0.2)

## 2023-05-28 LAB — BASIC METABOLIC PANEL
Anion gap: 7 (ref 5–15)
Anion gap: 10 (ref 5–15)
BUN: 49 mg/dL — ABNORMAL HIGH (ref 8–23)
BUN: 43 mg/dL — ABNORMAL HIGH (ref 8–23)
CO2: 22 mmol/L (ref 22–32)
CO2: 20 mmol/L — ABNORMAL LOW (ref 22–32)
Calcium: 7.2 mg/dL — ABNORMAL LOW (ref 8.9–10.3)
Calcium: 7.3 mg/dL — ABNORMAL LOW (ref 8.9–10.3)
Chloride: 100 mmol/L (ref 98–111)
Chloride: 103 mmol/L (ref 98–111)
Creatinine, Ser: 0.97 mg/dL (ref 0.44–1.00)
Creatinine, Ser: 0.94 mg/dL (ref 0.44–1.00)
GFR, Estimated: >60 mL/min (ref >60)
GFR, Estimated: >60 mL/min (ref >60)
Glucose, Bld: 392 mg/dL — ABNORMAL HIGH (ref 70–99)
Glucose, Bld: 175 mg/dL — ABNORMAL HIGH (ref 70–99)
Potassium: 2.6 mmol/L — CL (ref 3.5–5.1)
Potassium: 3.8 mmol/L (ref 3.5–5.1)
Sodium: 129 mmol/L — ABNORMAL LOW (ref 135–145)
Sodium: 133 mmol/L — ABNORMAL LOW (ref 135–145)

## 2023-05-28 LAB — RESPIRATORY PANEL BY PCR

## 2023-05-28 LAB — LACTIC ACID, PLASMA
Lactic Acid, Venous: 3.5 mmol/L (ref 0.5–1.9)
Lactic Acid, Venous: 2.6 mmol/L (ref 0.5–1.9)

## 2023-05-28 LAB — GLUCOSE, CAPILLARY
Glucose-Capillary: 367 mg/dL — ABNORMAL HIGH (ref 70–99)
Glucose-Capillary: 352 mg/dL — ABNORMAL HIGH (ref 70–99)
Glucose-Capillary: 192 mg/dL — ABNORMAL HIGH (ref 70–99)
Glucose-Capillary: 162 mg/dL — ABNORMAL HIGH (ref 70–99)
Glucose-Capillary: 165 mg/dL — ABNORMAL HIGH (ref 70–99)

## 2023-05-28 LAB — MYCOPLASMA PNEUMONIAE ANTIBODY, IGM: Mycoplasma pneumo IgM: 1013 U/mL — ABNORMAL HIGH (ref 0–769)

## 2023-05-28 LAB — PHOSPHORUS: Phosphorus: 2 mg/dL — ABNORMAL LOW (ref 2.5–4.6)

## 2023-05-28 LAB — BRAIN NATRIURETIC PEPTIDE: B Natriuretic Peptide: 1726.6 pg/mL — ABNORMAL HIGH (ref 0.0–100.0)

## 2023-05-28 LAB — MAGNESIUM: Magnesium: 2 mg/dL (ref 1.7–2.4)

## 2023-05-28 LAB — PROCALCITONIN: Procalcitonin: 3.87 ng/mL

## 2023-05-28 MED ORDER — STERILE WATER FOR INJECTION IJ SOLN
10.0000 mL | Freq: Once | INTRAMUSCULAR | Status: AC
  Filled 2023-05-28: qty 10

## 2023-05-28 MED ORDER — INSULIN ASPART 100 UNIT/ML IJ SOLN
0.0000 [IU] | Freq: Three times a day (TID) | INTRAMUSCULAR | Status: DC
  Administered 2023-05-28 – 2023-05-29 (×3): 4 [IU] via SUBCUTANEOUS
  Administered 2023-05-29 (×2): 3 [IU] via SUBCUTANEOUS
  Administered 2023-05-30 – 2023-05-31 (×2): 4 [IU] via SUBCUTANEOUS
  Filled 2023-05-28 (×9): qty 1

## 2023-05-28 MED ORDER — PANTOPRAZOLE SODIUM 40 MG IV SOLR
40.0000 mg | Freq: Every day | INTRAVENOUS | Status: DC
  Administered 2023-05-28 – 2023-06-01 (×5): 40 mg via INTRAVENOUS
  Filled 2023-05-28 (×5): qty 10

## 2023-05-28 MED ORDER — HALOPERIDOL LACTATE 5 MG/ML IJ SOLN
1.0000 mg | Freq: Once | INTRAMUSCULAR | Status: AC
  Administered 2023-05-28: 1 mg via INTRAVENOUS
  Filled 2023-05-28: qty 1

## 2023-05-28 MED ORDER — POTASSIUM CHLORIDE 10 MEQ/100ML IV SOLN
10.0000 meq | INTRAVENOUS | Status: AC
Start: 2023-05-28 — End: 2023-05-28
  Administered 2023-05-28 (×2): 10 meq via INTRAVENOUS
  Filled 2023-05-28 (×2): qty 100

## 2023-05-28 MED ORDER — NITROGLYCERIN 2 % TD OINT
0.5000 [in_us] | TOPICAL_OINTMENT | Freq: Once | TRANSDERMAL | Status: AC
  Administered 2023-05-28: 0.5 [in_us] via TOPICAL
  Filled 2023-05-28: qty 1

## 2023-05-28 MED ORDER — ALTEPLASE 2 MG IJ SOLR
INTRAMUSCULAR | Status: AC
  Administered 2023-05-28: 2 mg
  Filled 2023-05-28: qty 2

## 2023-05-28 MED ORDER — SODIUM CHLORIDE 0.9 % IV SOLN: INTRAVENOUS | Status: AC | PRN

## 2023-05-28 MED ORDER — POTASSIUM CHLORIDE 20 MEQ PO PACK: 40.0000 meq | PACK | Freq: Once | ORAL | Status: DC

## 2023-05-28 MED ORDER — NEPRO/CARBSTEADY PO LIQD
237.0000 mL | Freq: Two times a day (BID) | ORAL | Status: DC
  Administered 2023-05-29: 237 mL via ORAL

## 2023-05-28 MED ORDER — POTASSIUM PHOSPHATES 15 MMOLE/5ML IV SOLN
15.0000 mmol | Freq: Once | INTRAVENOUS | Status: AC
  Administered 2023-05-28: 15 mmol via INTRAVENOUS
  Filled 2023-05-28: qty 5

## 2023-05-28 MED ORDER — STERILE WATER FOR INJECTION IJ SOLN
INTRAMUSCULAR | Status: AC
  Administered 2023-05-28: 10 mL via INTRAMUSCULAR
  Filled 2023-05-28: qty 10

## 2023-05-28 MED ORDER — DIPHENHYDRAMINE HCL 50 MG/ML IJ SOLN
50.0000 mg | Freq: Once | INTRAMUSCULAR | Status: AC
  Administered 2023-05-28: 50 mg via INTRAVENOUS
  Filled 2023-05-28: qty 1

## 2023-05-28 MED ORDER — INSULIN GLARGINE-YFGN 100 UNIT/ML ~~LOC~~ SOLN
10.0000 [IU] | Freq: Every day | SUBCUTANEOUS | Status: DC
  Administered 2023-05-28 – 2023-06-01 (×4): 10 [IU] via SUBCUTANEOUS
  Filled 2023-05-28 (×5): qty 0.1

## 2023-05-28 MED ORDER — ALTEPLASE 2 MG IJ SOLR
2.0000 mg | Freq: Once | INTRAMUSCULAR | Status: AC
Start: 2023-05-28 — End: 2023-05-28

## 2023-05-28 MED ORDER — ALTEPLASE 2 MG IJ SOLR
2.0000 mg | Freq: Once | INTRAMUSCULAR | Status: AC
Start: 2023-05-28 — End: 2023-05-28
  Administered 2023-05-28: 2 mg
  Filled 2023-05-28: qty 2

## 2023-05-28 MED ORDER — POTASSIUM CHLORIDE 20 MEQ PO PACK
40.0000 meq | PACK | Freq: Once | ORAL | Status: AC
  Administered 2023-05-28: 40 meq via ORAL
  Filled 2023-05-28: qty 2

## 2023-05-28 MED ORDER — IOHEXOL 350 MG/ML SOLN
75.0000 mL | Freq: Once | INTRAVENOUS | Status: AC | PRN
  Administered 2023-05-28: 75 mL via INTRAVENOUS

## 2023-05-28 MED ORDER — LORAZEPAM 2 MG/ML IJ SOLN
1.0000 mg | Freq: Four times a day (QID) | INTRAMUSCULAR | Status: DC | PRN
  Administered 2023-05-28 – 2023-05-31 (×4): 1 mg via INTRAVENOUS
  Filled 2023-05-28 (×4): qty 1

## 2023-05-28 NOTE — Progress Notes (Signed)
The patient is very agitated trying to climb out of bed and pulling out her PICC line. She's on her call bell the whole night. She's yelling for sugary foods right now and  doesn't understand the rational to stay away from it. Notified Jawo NP and received haldol 1 mg IV x 1 dose. Will continue to monitor.

## 2023-05-28 NOTE — Progress Notes (Signed)
   05/28/23 1700  Spiritual Encounters  Type of Visit Initial  Care provided to: Pt and family  Referral source Family  Reason for visit Religious ritual  OnCall Visit Yes  Interventions  Spiritual Care Interventions Made Established relationship of care and support;Compassionate presence;Prayer;Encouragement  Intervention Outcomes  Outcomes Awareness of support;Connection to spiritual care  Spiritual Care Plan  Spiritual Care Issues Still Outstanding No further spiritual care needs at this time (see row info)   Chaplain visited with patient and family providing compassion, empathy and prayer.  Chaplain spiritual and emotional services remain available as the need arises.

## 2023-05-28 NOTE — Progress Notes (Signed)
NAME:  Jenny [name], MRN:  [id number], DOB:  11-28-1945, LOS: 8 ADMISSION DATE:  05/19/2023   History of Present Illness:  This is a case of a 77 year old female patient with a past medical history of aortic valve stenosis status post bioprosthetic aortic valve replacement, hypothyroidism, diabetes mellitus type 2 who presented to [facility] on 05/19/2023 with generally not feeling well shortness of breath nausea and vomiting.  Chest x-ray with multifocal opacity.  Managed for community-acquired pneumonia and was able to be weaned down to 1 L nasal cannula.  However overnight on 11/25 her oxygen requirement started going up chest x-ray with worsening bilateral opacities.  Thought to be secondary to pulmonary edema and therefore was started on diuresis.  Despite adequate diuresis her oxygen has not been improving.  Currently on high flow and CPAP at 100% FiO2.  Echocardiogram on admission with normal LVEF, bioprosthetic aortic valve in place with a gradient of 15 mmHg.  However there was moderate mitral valve regurgitation and moderate pulmonary valve regurgitation.  Pertinent  Medical History  []  Severer aortic stenosis s/p Bioprosthetic valve  []  Hypothyroidism  []  DM Type II   Significant Hospital Events: Including procedures, antibiotic start and stop dates in addition to other pertinent events   05/27/2023 - Started on Zosyn, doxycycline and systemic steroids.   Interim History / Subjective:  This AM. Patient with worsening respiratory status. Switched to CPAP at 100% FiO2.   Objective   Blood pressure (!) 148/60, pulse (!) 121, temperature 98.6 F (37 C), temperature source Axillary, resp. rate (!) 30, height 4\' 8"  (1.422 m), weight 45.3 kg, SpO2 96%.    FiO2 (%):  [65 %-100 %] 100 %   Intake/Output Summary (Last 24 hours) at 05/28/2023 1318 Last data filed at 05/28/2023 1013 Gross per 24 hour  Intake 645.02 ml  Output 1775 ml  Net -1129.98 ml    Filed Weights   05/26/23 0403 05/27/23 0340 05/28/23 0500  Weight: 48.1 kg 43.5 kg 45.3 kg    Examination: General: Elderly, cachectic in moderate respiratory distress  HENT: Supple neck, reactive pupils, EOMI  Lungs: Bibasilar crackles noted  Cardiovascular: Normal S1, Normal S2, tachycardic  Abdomen: Soft, non tender, non distended, + BS  Extremities: Cold and clammy   Labs and imaging were reviewed.   Assessment & Plan:  This is a case of a 77 year old female patient with a past medical history of aortic valve stenosis status post bioprosthetic aortic valve replacement, hypothyroidism, diabetes mellitus type 2 who presented to [facility] on 05/19/2023 with generally not feeling well shortness of breath nausea and vomiting.  Chest x-ray with multifocal opacity.  Managed for community-acquired pneumonia and was able to be weaned down to 1 L nasal cannula.  However overnight on 11/25 her oxygen requirement started going up chest x-ray with worsening bilateral opacities.  Thought to be secondary to pulmonary edema and therefore was started on diuresis.  Despite adequate diuresis her oxygen has not been improving.  Currently on high flow and CPAP at 100% FiO2.  Echcocardiogram with normal LVEF 55-60%, moderate mitral valve regurgitation, moderate mitral stenosis and moderate tricuspid valve regurgitation.   #Acute hypoxic respiratory failure in the setting of.. #Multifocal pneumonia now resolved c/b  #Pulmonary edema secondary to..  # Valvular heart disease with moderate mitral valve regurgitation and moderate steonsis # Hypokalemia in the setting of diuresis, hypocalcemia and hyponatremia   Neuro: Low dose precedex for anxiety  CVS: Conitnue with  diuresis for now might need Betablocker or afterload reduction, will await cardiology recommendations. Might also need slowing down on diuresis  Lungs: Continue with CPAP support for spO2 greater than 90%. Continue with  methylprednisolone 40mg  daily for now. C./w zosyn and doxycycline for now. Duonebs scheduled.  GI: NPO. PPI daily at bedtime  Renal: Diuresis as above for a goal negative 1L  Heme: On heparin for dvt prophylaxis   Best Practice (right click and "Reselect all SmartList Selections" daily)   Diet/type: NPO DVT prophylaxis: heparin injection 5,000 Units Start: 05/20/23 2200 SCDs Start: 05/19/23 1819   Pressure ulcer(s): not present on admission  GI prophylaxis: PPI Lines: Central line Foley:  Yes, and it is still needed Code Status:  full code  Discussed with family members at bedside about the current condition and prognosis as well as risk of induction for intubation given her valvular heart disease. They expressed understanding and wanted to wait for cardiology input before any decision is taken.   Last date of multidisciplinary goals of care discussion [05/28/2023]  I spent 60 minutes caring for this patient today, including preparing to see the patient, obtaining a medical history , reviewing a separately obtained history, performing a medically appropriate examination and/or evaluation, counseling and educating the patient/family/caregiver, ordering medications, tests, or procedures, referring and communicating with other health care professionals (not separately reported), documenting clinical information in the electronic health record, and independently interpreting results (not separately reported/billed) and communicating results to the patient/family/caregiver  Janann Colonel, MD [facility] 05/28/2023 1:36 PM

## 2023-05-28 NOTE — Progress Notes (Signed)
PROGRESS NOTE    Jenny [name]  OZD:[id number] DOB: [birth year] DOA: 05/19/2023 PCP: Corky Downs, MD  [id number]/[id number]-AA  LOS: 8 days   Brief hospital course:   Assessment & Plan: 77 yo female who presented to [facility] ER on 11/18 via EMS with c/o "not feeling well", nausea, and vomiting. EMS reported when they arrived at pts home she was lethargic and diaphoretic with generalized weakness. She received iv fluids en route to the ER. Pts husband reports the pt has had a cough over the past 2 weeks, however her cough worsened today and she vomited bile prompting EMS notification.   Upon arrival to the ER pt hypotensive and tachycardic meeting sepsis criteria. She received 2L NS bolus/azithromycin/ceftriaxone, however she remained hypotensive requiring levophed gtt. CXR concerning for pneumonia.  PCCM team contacted for ICU admission.   Pt was transferred to hospitalist service on 05/22/23.  #Acute respiratory failure with hypoxia --found to have PNA on presentation and completed a course of abx.  Supplemental oxygen was weaned down to 1L at one point, but requirement went up, this morning requiring hfnc at 60 L/min --CXR showed likely pulm edema.  Was started on IV lasix 40 BID which we are continuing --CTA chest neg for PE, and again showed "Severe diffuse bilateral airspace disease with small effusions. Findings could reflect edema or infection."  Pt had finished a course of abx recently, no fever, no leukocytosis, procal trending down, not as likely to have a new PNA, therefore, have not resumed on abx. Plan: --Continue supplemental O2 to keep sats >=92% --PCCM following -- continue zosyn/doxy -- continue steroids -- patient clarifies she would want trial of intubation if it came to that - f/u rvp - sputum for culture if able to produce  Diplopia New today, right pupil constricted and fixed. After discussion w/ Dr. Otelia Limes of neurology will check CT of head and CTA head/neck  Pulm  edema --CXR showed "widespread pulmonary interstitial opacity" likely pulm edema.   --IV lasix 40 mg BID started on 11/24, however, could not get accurate measurement of urine output Plan: --cont IV lasix 40 BID --Foley to monitor urine output - cardiology re-engaged  Hypotension BPs stable not currently on pressors  #Septic shock- PRESENT ON ADMISSION - DUE TO PNEUMONIA --hypotensive with lactic acidosis on arrival  --off pressors currently  NSTEMI --trop peaked at 848.  In the setting of respiratory distress, pneumonia, sepsis. --cardio consulted --Normal ejection fraction on echo  --cont Toprol 12.5 mg daily (new) -Consider ischemic workup following recovery, could be arranged as outpatient   Aortic valve stenosis status post bioprosthetic aortic valve  --no issue  # Chronic systolic CHF, ruled out --current Echo showed normal LVEF  Hx: CAD, HLD, Stroke --cont ASA --resume statin after discharge   # Mild acute kidney injury secondary ATN  --Cr peaked at 1.27.  today wnl --monitor Cr while diuresing  #Metabolic acidosis  #Lactic acidosis   #Hypokalemia  - monitor and supplement PRN - aggressive repeletion today  Hypophos --monitor and supplement PRN   #Transaminitis - monitor   # Hx of Iron deficiency anemia  #Acute on chronic thrombocytopenia  - Trend CBC, it's stable - Monitor for s/sx of bleeding  - Transfuse for hgb <7   #Type II diabetes mellitus  - Hemoglobin A1c only 5.3  --ACHS and SSI for now, hyperglycemic with steroids so will add basal semglee and increase intensity of SSI  # IBS with diarrhea --pt reported at baseline, she takes Imodium  for her diarrhea, minimum 2 mg x5 tabs per day. --multiple episodes of diarrhea noted, C diff and GI path neg.  Rectal tube inserted on 11/21.  Received Imodium and still having liquid stool --cont Lomotil to 2 tabs QID   DVT prophylaxis: Heparin SQ Code Status: Full code  Family Communication: none at  bedside Level of care: Stepdown Dispo:   The patient is from: home Anticipated d/c is to: SNF rehab Anticipated d/c date is: > 3 days   Subjective and Interval History:  Reports feeling generally weak, nothing focal. New right eye floaters today. Cough not productive   Objective: Vitals:   05/28/23 1006 05/28/23 1131 05/28/23 1200 05/28/23 1300  BP:  (!) 135/54 (!) 99/41 (!) 128/104  Pulse:  (!) 106 (!) 112 (!) 110  Resp:  (!) 30 (!) 29 (!) 45  Temp:      TempSrc:      SpO2: 93% 96% 97% 99%  Weight:      Height:        Intake/Output Summary (Last 24 hours) at 05/28/2023 1600 Last data filed at 05/28/2023 1013 Gross per 24 hour  Intake 346.61 ml  Output 1775 ml  Net -1428.39 ml   Filed Weights   05/26/23 0403 05/27/23 0340 05/28/23 0500  Weight: 48.1 kg 43.5 kg 45.3 kg    Examination:   Constitutional: NAD, sleepy, but arousable HEENT: conjunctivae and lids normal, right pupil constricted CV: No cyanosis.   RESP: normal respiratory effort, heated hf SKIN: warm, dry Psych: subdued mood and affect.   Neuro: anisocoria, remainder of cn nerves appear wnl, equal strength and sensation bilaterally  Rectal tube with liquid stool   Data Reviewed: I have personally reviewed labs and imaging studies  CRITICAL CARE Performed by: Silvano Bilis   Total critical care time: 48 minutes  Critical care time was exclusive of separately billable procedures and treating other patients.  Critical care was necessary to treat or prevent imminent or life-threatening deterioration.  Critical care was time spent personally by me on the following activities: development of treatment plan with patient and/or surrogate as well as nursing, discussions with consultants, evaluation of patient's response to treatment, examination of patient, obtaining history from patient or surrogate, ordering and performing treatments and interventions, ordering and review of laboratory studies, ordering  and review of radiographic studies, pulse oximetry and re-evaluation of patient's condition.    Silvano Bilis, MD [facility] If 7PM-7AM, please contact night-coverage 05/28/2023, 4:00 PM

## 2023-05-28 NOTE — Consult Note (Signed)
Consultation Note Date: 05/28/2023 at 1300  Patient Name: Jenny [name]  DOB: Oct 16, 1945  MRN: [id number]  Age / Sex: 77 y.o., female  PCP: Corky Downs, MD Referring Physician: Kathrynn Running, MD  HPI/Patient Profile: 77 y.o. female  with past medical history of CAD, HLD, stroke, severe aortic stenosis with aortic valve replacement (2018), TIA, type 2 diabetes, former smoker, and IM tibial nail placement (2019) admitted on 05/19/2023 with nausea, vomiting, and weakness.  Chest x-ray with multifocal opacity. Managed for community-acquired pneumonia and was able to be weaned down to 1 L nasal cannula. However overnight on 11/25 her oxygen requirement started going up chest x-ray with worsening bilateral opacities. Thought to be secondary to pulmonary edema and therefore was started on diuresis. Despite adequate diuresis her oxygen has not been improving. Currently on high flow and CPAP at 100% FiO2.   Echocardiogram on admission with normal LVEF, bioprosthetic aortic valve in place with a gradient of 15 mmHg. However there was moderate mitral valve regurgitation and moderate pulmonary valve regurgitation.  Cardiology following.  PMT was consulted to discuss goals of care.   Clinical Assessment and Goals of Care: Extensive chart review completed prior to meeting patient including labs, vital signs, imaging, progress notes, orders, and available advanced directive documents from current and previous encounters. I then met with patient, her husband Mellody Dance, and patient's son Trey Paula at bedside to discuss diagnosis prognosis, GOC, EOL wishes, disposition and options.  I introduced Palliative Medicine as specialized medical care for people living with serious illness. It focuses on providing relief from the symptoms and stress of a serious illness. The goal is to improve quality of life for both the patient and the  family.  We discussed a brief life review of the patient.  Mellody Dance and Heer have been married for 40+ years.  Jerrell had 2 children prior to marrying Mellody Dance.  1 son is deceased.  After death of her son, patient was unable to work due to anxiety and panic attacks and was on disability.  As far as functional and nutritional status Mellody Dance shares that Sailor was independent with ADLs.  Mellody Dance and Trey Paula shares she had just gone to [place] and spent 5 hours shopping with her daughter-in-law without difficulty.  So, they are in shock and surprised at how quickly she has decompensated.  We discussed patient's functional, nutritional, and cognitive status is important indicators of her overall prognosis.  Discussed tenuous nature of her current respiratory status in addition to putting a strain on her already weakened heart.  Cardiology at bedside gave medical update.  We discussed goals and boundaries of care with patient's husband, son, and CCM Dr.Assaker.  I highlighted that patient is on neck support with BiPAP and continued to have high respiratory rate.  Reviewed that patient is working hard to breathe which puts additional strain on the heart.  Eventually, she will wear out and either experience a cardiac event and require intubation.  Attempted to elicit values and goals important to patient and family.  Patient's husband shares that he thinks the patient would never want to be placed on life support.  Discussed that life support, use of ventilatory support, and other life-sustaining measures may be against what patient would want for herself.  Husband agrees that he would not want patient to be on a ventilator as he does not believe it would give her any better quality of life.  Discussed DNR status with limited interventions.  Patient's husband and son were in agreement with DNR/DNI status.  CODE STATUS changed and RN made aware.  We discussed additional boundaries to patient's plan of care.  In light  of Mellody Dance sharing that patient would not want life-sustaining measures, we discussed not escalating care, using pressors, or doing other aggressive interim inventions to sustain her life.  Ples Specter in agreement for no escalation of care.  They want to continue to treat the pneumonia and see if patient can "perk up".  Discussed holding hope and reality in equal light.  Human mortality, quality versus quantity of life, and patient's values and wishes discussed.  Therapeutic silence, active listening, and emotional support provided.  CODE STATUS changed to DNR/DNI.  Treat the treatable.  Continue to monitor patient with no escalation of care.  We discussed that if patient continues to decompensate and we would shift to full comfort measures.  Comfort measures briefly described.  Ples Specter shared understanding of comfort measures but are hopeful that patient will make a turnaround.  PMT remains available patient and family throughout her hospitalization. PMT will continue to follow and support patient and family throughout her hospitalization.   Primary Decision Maker NEXT OF KIN  Physical Exam Vitals reviewed.  Constitutional:      General: She is in acute distress.     Appearance: She is ill-appearing.  HENT:     Head: Normocephalic.     Mouth/Throat:     Mouth: Mucous membranes are moist.  Eyes:     Pupils: Pupils are equal, round, and reactive to light.  Pulmonary:     Effort: Respiratory distress present.  Musculoskeletal:     Comments: Generalized weakness  Skin:    General: Skin is dry.     Coloration: Skin is pale.  Psychiatric:        Behavior: Behavior normal.        Judgment: Judgment normal.     Palliative Assessment/Data: 30%     Thank you for this consult. Palliative medicine will continue to follow and assist holistically.   Time Total: 75 minutes  Time spent includes: Detailed review of medical records (labs, imaging, vital signs), medically  appropriate exam (mental status, respiratory, cardiac, skin), discussed with treatment team, counseling and educating patient, family and staff, documenting clinical information, medication management and coordination of care.  Signed by: Georgiann Cocker, DNP, FNP-BC Palliative Medicine   Please contact Palliative Medicine Team providers via [facility] for questions and concerns.              =

## 2023-05-28 NOTE — Progress Notes (Signed)
Nutrition Follow Up Note   DOCUMENTATION CODES:   Severe malnutrition in context of chronic illness  INTERVENTION:   Recommend NGT placement and nutrition support if plan is for full aggressive care  Nepro Shake po TID, each supplement provides 425 kcal and 19 grams protein  MVI po daily   Pt remains at high refeed risk; recommend monitor potassium, magnesium and phosphorus labs daily until stable  Daily weights   If NGT placed, recommend:  Osmolite 1.2@50ml /hr- Initiate at 27ml/hr and increase by 18ml/hr q 12 hrs until goal rate is reached.   Free water flushes 30ml q4 hours to maintain tube patency   Regimen provides 1440kcal/day, 67g/day protein and 1152ml/day of free water.   Juven Fruit Punch BID via tube, each serving provides 95kcal and 2.5g of protein (amino acids glutamine and arginine)  NUTRITION DIAGNOSIS:   Severe Malnutrition related to chronic illness as evidenced by severe fat depletion, severe muscle depletion.  GOAL:   Patient will meet greater than or equal to 90% of their needs -not met   MONITOR:   PO intake, Supplement acceptance, Labs, Weight trends, Skin, I & O's  ASSESSMENT:   77 y/o female with h/o severe aortic stenosis, CHF, DM, TIA/stroke, mood disorder, IBS, hypothyroidism, CAD, HLD, HTN, RA and anxiety who is admitted with PNA, sepsis, shock and AKI.  Visited pt's room today. Pt with worsening respiratory distress and requiring CPAP now. Pt with poor appetite and oral intake since admission; pt eating <10% of meals and refusing supplements. Per pt and husband report, pt with poor oral intake at baseline. Pt seen by SLP and was changed to a dysphagia 1/nectar thick diet. RD will change pt to nectar thick supplements. Would recommend NGT placement and nutrition support if plan is for full aggressive care. Pt is at high refeed risk. Per chart, pt appears weight stable since admission.   Medications reviewed and include: aspirin, lomotil,  lasix, heparin, insulin, synthroid, solu-medrol, MVI, protonix, KCl, thiamine, doxycycline, zosyn, KPhos   Labs reviewed: Na 129(L), K 2.6(L), BUN 49(H), P 2.0(L), Mg 2.0 wnl Hgb 8.7(L), Hct 25.5(L) Cbgs- 192, 352, 367 x 24 hrs  AIC 5.3- 11/18  Diet Order:   Diet Order             DIET - DYS 1 Room service appropriate? Yes with Assist; Fluid consistency: Nectar Thick  Diet effective now                  EDUCATION NEEDS:   No education needs have been identified at this time  Skin:  Skin Assessment: Reviewed RN Assessment (PI coccyx)  Last BM:  11/27- TYPE 7  Height:   Ht Readings from Last 1 Encounters:  05/21/23 4\' 8"  (1.422 m)    Weight:   Wt Readings from Last 1 Encounters:  05/28/23 45.3 kg    Ideal Body Weight:  42.2 kg  BMI:  Body mass index is 22.39 kg/m.  Estimated Nutritional Needs:   Kcal:  1200-1400kcal/day  Protein:  60-70g/day  Fluid:  1.2-1.4L/day  Betsey Holiday MS, RD, LDN Please refer to [facility] for RD and/or RD on-call/weekend/after hours pager

## 2023-05-28 NOTE — Progress Notes (Addendum)
SLP Cancellation Note  Patient Details Name: Jenny [name] MRN: [id number] DOB: December 16, 1945   Cancelled treatment:       Reason Eval/Treat Not Completed:  (chart reviewed)  Per chart/NSG notes, pt was agitated last evening/this morning pulling off her HFNC and now has had a decline in overall status. Family is meeting w/ Palliative Care and MD re: GOC and comfort care transition.  ST services will monitor for any further needs. Recommend discontinuing the oral diet if not appropriate any longer.     Jerilynn Som, MS, CCC-SLP Speech Language Pathologist Rehab Services; [facility] [phone] (ascom) Polina Burmaster 05/28/2023, 2:29 PM

## 2023-05-28 NOTE — Progress Notes (Signed)
PHARMACY CONSULT NOTE - FOLLOW UP  Pharmacy Consult for Electrolyte Monitoring and Replacement   Recent Labs: Potassium (mmol/L)  Date Value  05/28/2023 2.6 (LL)  10/24/2014 4.4   Magnesium (mg/dL)  Date Value  46/96/2952 2.0   Calcium (mg/dL)  Date Value  84/13/2440 7.2 (L)   Calcium, Total (mg/dL)  Date Value  04/27/2535 8.6 (L)   Albumin (g/dL)  Date Value  64/40/3474 2.3 (L)  05/23/2020 3.9  03/02/2012 3.2 (L)   Phosphorus (mg/dL)  Date Value  25/95/6387 2.0 (L)   Sodium (mmol/L)  Date Value  05/28/2023 129 (L)  05/23/2020 138  10/24/2014 134 (L)     Assessment: 77 y/o female with h/o severe aortic stenosis, CHF, DM, TIA/stroke, mood disorder, IBS, hypothyroidism, CAD, HLD, HTN, RA and anxiety who is admitted with PNA, sepsis, shock and AKI.   Diuretics: furosemide 40 mg IV BID  Goal of Therapy:  Electrolytes WNL  Plan:  ---15 mmol IV potassium phosphate x 1 (contains 22 mEq IV potassium) per NP ---10 mEq IV KCl x 2 per NP ---40 mEq po KCl ---recheck electrolytes in am  Jenny [name] ,PharmD Clinical Pharmacist 05/28/2023 8:10 AM

## 2023-05-28 NOTE — Progress Notes (Signed)
CBG was 400 mg/dl at bedtime.  Andrey Farmer NP ordered to recheck the CBG at 0200 and it's 367. She's been eating the whole night. I guess with the steroids that she received on day shift.  Notified Jawo NP to see if he would like to do anything different, and he said no. Will continue to monitor.

## 2023-05-28 NOTE — Progress Notes (Signed)
Patient is puling the heated HF from her nose and started de sating into the 70s. This  RN is at the bedside securing the cord into her nostril.  Andrey Farmer NP  came to the bedside and ordered to add NRB mask on her heated HF.  Respiratory was called in to adjust the setting and she's bumped up to 60 L and 100% FI02. The patient is educated not to pull her 02 from her nose. She doesn't seem to comprehend anything. Will continue to monitor.

## 2023-05-28 NOTE — Progress Notes (Addendum)
Her Potasium came 2.6 this morning. Geradine Girt NP notified.  New orders received from [facility] NP.

## 2023-05-28 NOTE — IPAL (Addendum)
  Interdisciplinary Goals of Care Family Meeting   Date carried out: 05/28/2023  Location of the meeting: Unit  Member's involved: Physician, Nurse Practitioner, Bedside Registered Nurse, Family Member or next of kin, and Palliative care team member  Durable Power of Attorney or acting medical decision maker: Husband    Discussion: We discussed goals of care for [employer] .  Given her frailty, multiple co morbidities, heart disease and pulmonary disease we have decided to switch her goal  status to DNR/DNI and should her respiratory status worsen in the next 2 hours than switch to focus on her comfort.   Code status: DNR/DNI  Disposition: Continue current acute care  Time spent for the meeting: 15   Jenny Colonel, MD  05/28/2023, 2:12 PM

## 2023-05-28 NOTE — Plan of Care (Signed)
  Problem: Nutritional: Goal: Maintenance of adequate nutrition will improve Outcome: Progressing   Problem: Pain Management: Goal: General experience of comfort will improve Outcome: Progressing   Problem: Coping: Goal: Ability to adjust to condition or change in health will improve Outcome: Not Progressing   Problem: Health Behavior/Discharge Planning: Goal: Ability to manage health-related needs will improve Outcome: Not Progressing   Problem: Metabolic: Goal: Ability to maintain appropriate glucose levels will improve Outcome: Not Progressing   Problem: Nutritional: Goal: Progress toward achieving an optimal weight will improve Outcome: Not Progressing   Problem: Skin Integrity: Goal: Risk for impaired skin integrity will decrease Outcome: Not Progressing   Problem: Tissue Perfusion: Goal: Adequacy of tissue perfusion will improve Outcome: Not Progressing   Problem: Education: Goal: Knowledge of General Education information will improve Description: Including pain rating scale, medication(s)/side effects and non-pharmacologic comfort measures Outcome: Not Progressing   Problem: Clinical Measurements: Goal: Ability to maintain clinical measurements within normal limits will improve Outcome: Not Progressing Goal: Will remain free from infection Outcome: Not Progressing Goal: Respiratory complications will improve Outcome: Not Progressing Goal: Cardiovascular complication will be avoided Outcome: Not Progressing   Problem: Activity: Goal: Risk for activity intolerance will decrease Outcome: Not Progressing   Problem: Coping: Goal: Level of anxiety will decrease Outcome: Not Progressing   Problem: Elimination: Goal: Will not experience complications related to bowel motility Outcome: Not Progressing   Problem: Safety: Goal: Ability to remain free from injury will improve Outcome: Not Progressing

## 2023-05-28 NOTE — Progress Notes (Signed)
RT assisted with patient transport to CT and back with no complications.

## 2023-05-28 NOTE — Progress Notes (Addendum)
Patient Name: Jenny [name] Date of Encounter: 05/28/2023 [facility] Cardiologist: Lorine Bears, MD   Interval Summary  .    Most recently seen by cardiology on 05/22/2023. Patient has acute respiratory failure with hypoxia in the setting of PNA, pulmonary edema and sepsis.  Respiratory status has significantly declined in the past 2 days despite. Patient is non alert or awake during exam. Long discussion with family regarding future care goals.   Vital Signs .    Vitals:   05/28/23 1006 05/28/23 1131 05/28/23 1200 05/28/23 1300  BP:  (!) 135/54 (!) 99/41 (!) 128/104  Pulse:  (!) 106 (!) 112 (!) 110  Resp:  (!) 30 (!) 29 (!) 45  Temp:      TempSrc:      SpO2: 93% 96% 97% 99%  Weight:      Height:        Intake/Output Summary (Last 24 hours) at 05/28/2023 1444 Last data filed at 05/28/2023 1013 Gross per 24 hour  Intake 645.02 ml  Output 1775 ml  Net -1129.98 ml      05/28/2023    5:00 AM 05/27/2023    3:40 AM 05/26/2023    4:03 AM  Last 3 Weights  Weight (lbs) 99 lb 13.9 oz 95 lb 14.4 oz 106 lb 0.7 oz  Weight (kg) 45.3 kg 43.5 kg 48.1 kg      Telemetry/ECG    Sinus tachycardia with rates 90-110s  - Personally Reviewed  Physical Exam .   GEN: Patient is not alert or awake.  Neck: No JVD Cardiac: Tachycardic, regular rhythm, no murmurs, rubs, or gallops.  Respiratory: Bibasalar crackles bilaterally. GI: Soft, nontender, non-distended  MS: No edema  Assessment & Plan .     Acute respiratory distress Pneumonia Aspiration pneumonia Sepsis - Patient initially presented with worsening cough and SOB over past several weeks. Was hypotensive with lactic acidosis, meeting sepsis criteria on arrival.  - CXR showed likely pulmonary edema, CTA chest showed severed diffuse bilateral airspace disease with small effusions - Treated for PNA and was weaned to 1 L nasal cannula, however on 11/25 overnight respiratory status significantly declined - Despite  diuresis and CPAP 100% FiO2, breathing is unimproved - Net output 1129 mL - Respirations 20-40s - Had extensive discussion with family, critical care, and palliative care teams regarding goals of care. Her husband made the decision to switch the focus of her care to comfort, switching code status to DNR/DNI  Valvular heart disease - S/p bioprosthetic aortic valve replacement - Echo 05/20/2023 showed LVEF 55-60%, bioprosthetic aortic valve with appropriate gradient of 15 mmHg, moderate mitral valve regurgitation and moderate pulmonary valve regurgitation - Patient is not a candidate for advanced therapies  NSTEMI - In the setting of respiratory distress, pneumonia, and sepsis - Troponin peak 848 on 05/20/2023 - On heparin for DVT prophylaxis - Telemetry showed sinus tachycardia - Starting nitroglycerin 2% ointment 0.5 inch q6h in place of BB for afterload reduction   For questions or updates, please contact [facility] Please consult [email or web address] for contact info under        Signed, Orion Crook, PA-C    Attending Note Patient seen and examined, agree with detailed note above,   Patient presentation and plan discussed on rounds.    EKG lab work, chest x-ray, echocardiogram reviewed independently by myself  Seen by cardiology earlier in her admission Seen for elevated troponin in the setting of pneumonia/sepsis Was not placed on heparin,  no catheterization performed in the setting of anemia, thrombocytopenia Medical management was recommended  On review of progress notes, had been doing well and had moved to the floor with improving respiratory status Cut down to 1 L nasal cannula, on ceftriaxone and Z-Pak, was on Lasix 40 IV daily  Acutely May 25, 2019 4 in the morning with hypoxia 77%, given nebulizers, rapid response called, increased up to 4 L nasal cannula, Found coughing in bed, difficulty bringing phlegm up Continue desaturations through the morning,  increased up to 15 L high flow Chest x-ray with widespread pulmonary interstitial opacity IV Lasix up to 40 twice daily Minimal urine output Transition to the unit/ICU given hypoxia CT scan chest with severe diffuse bilateral airspace disease with small effusions, no PE Mediastinal adenopathy getting worse, felt to be reactive  On rounds today continues on Lasix IV twice daily Continued worsening respiratory distress, on BiPAP  FiO2 100%, respiratory rate 33 May 27, 2023, restarted on pneumonia treatment Zosyn and Doxy Overnight with agitation, pulling on PICC line, climbing out of bed, using call bell frequently, yelling for sugary foods Nurses reporting she is confused, pulling off her nonrebreather mask  Minimally arousable on exam, somnolent. On examination : Lethargic, minimally arousable, will moan on stimuli no JVD, lungs coarse breath sounds heart sounds regular normal S1-S2 no murmurs appreciated, abdomen soft nontender no significant lower extremity edema.  Musculoskeletal exam unable to test neurologic exam lethargic, unable to test     Latest Ref Rng & Units 05/28/2023    1:19 PM 05/28/2023    4:51 AM 05/27/2023    3:39 AM  BMP  Glucose 70 - 99 mg/dL 841  324  401   BUN 8 - 23 mg/dL 43  49  54   Creatinine 0.44 - 1.00 mg/dL 0.27  2.53  6.64   Sodium 135 - 145 mmol/L 133  129  133   Potassium 3.5 - 5.1 mmol/L 3.8  2.6  3.6   Chloride 98 - 111 mmol/L 103  100  103   CO2 22 - 32 mmol/L 20  22  20    Calcium 8.9 - 10.3 mg/dL 7.3  7.2  7.1       Latest Ref Rng & Units 05/28/2023    4:51 AM 05/27/2023    3:39 AM 05/26/2023    4:00 AM  CBC  WBC 4.0 - 10.5 K/uL 10.1  5.5  7.3   Hemoglobin 12.0 - 15.0 g/dL 8.7  8.5  9.5   Hematocrit 36.0 - 46.0 % 25.5  25.3  27.0   Platelets 150 - 400 K/uL 115  90  100    A/P: Acute on chronic respiratory failure Appeared to have been doing better, was on the floor on 1 L, acutely on November 24 with hypoxia and decline since that  time not responding to IV Lasix twice daily -Prior echocardiogram November 19 showing normal LV function, normal RV size and function with normal right heart pressures, Moderate MR, TR, PR, prosthetic valve with mean gradient consistent with AVR -Goals of care discussed with family, at current time they do not want her intubated -Would continue IV Lasix twice daily, half inch Nitropaste could be used -On broad-spectrum antibiotics given history of pneumonia Not a candidate for advanced therapies for valvular heart disease -If aggressive care desired, repeat echocardiogram could be performed  Non-STEMI In the setting of respiratory distress/hypoxia, pneumonia, sepsis on arrival with troponin 800 Not a candidate for cardiac catheterization given anemia,  sepsis, very frail state  Pneumonia/sepsis Had completed antibiotics, improved, on the floor on 1 L Acute decline November 24 with hypoxia and transferred to the unit requiring 15 L Restarted on broad-spectrum antibiotics out of concern for recurrent pneumonia Based on CT scan results  Long discussion with family at the bedside concerning goals of care, management of hypoxia  Signed: Dossie Arbour  M.D., Ph.D. [facility]

## 2023-05-29 DIAGNOSIS — Z515 Encounter for palliative care: Secondary | ICD-10-CM | POA: Diagnosis not present

## 2023-05-29 DIAGNOSIS — J9601 Acute respiratory failure with hypoxia: Secondary | ICD-10-CM

## 2023-05-29 DIAGNOSIS — E876 Hypokalemia: Secondary | ICD-10-CM | POA: Diagnosis not present

## 2023-05-29 DIAGNOSIS — Z7189 Other specified counseling: Secondary | ICD-10-CM | POA: Diagnosis not present

## 2023-05-29 DIAGNOSIS — N179 Acute kidney failure, unspecified: Secondary | ICD-10-CM | POA: Diagnosis not present

## 2023-05-29 DIAGNOSIS — A419 Sepsis, unspecified organism: Secondary | ICD-10-CM | POA: Diagnosis not present

## 2023-05-29 DIAGNOSIS — J811 Chronic pulmonary edema: Secondary | ICD-10-CM | POA: Diagnosis not present

## 2023-05-29 DIAGNOSIS — R6521 Severe sepsis with septic shock: Secondary | ICD-10-CM | POA: Diagnosis not present

## 2023-05-29 DIAGNOSIS — Z66 Do not resuscitate: Secondary | ICD-10-CM

## 2023-05-29 DIAGNOSIS — K7460 Unspecified cirrhosis of liver: Secondary | ICD-10-CM | POA: Diagnosis not present

## 2023-05-29 DIAGNOSIS — E43 Unspecified severe protein-calorie malnutrition: Secondary | ICD-10-CM | POA: Diagnosis not present

## 2023-05-29 LAB — BASIC METABOLIC PANEL
Anion gap: 11 (ref 5–15)
Anion gap: 10 (ref 5–15)
BUN: 40 mg/dL — ABNORMAL HIGH (ref 8–23)
BUN: 37 mg/dL — ABNORMAL HIGH (ref 8–23)
CO2: 22 mmol/L (ref 22–32)
CO2: 22 mmol/L (ref 22–32)
Calcium: 7.3 mg/dL — ABNORMAL LOW (ref 8.9–10.3)
Calcium: 7.4 mg/dL — ABNORMAL LOW (ref 8.9–10.3)
Chloride: 101 mmol/L (ref 98–111)
Chloride: 101 mmol/L (ref 98–111)
Creatinine, Ser: 1.01 mg/dL — ABNORMAL HIGH (ref 0.44–1.00)
Creatinine, Ser: 1.02 mg/dL — ABNORMAL HIGH (ref 0.44–1.00)
GFR, Estimated: 58 mL/min — ABNORMAL LOW (ref >60)
GFR, Estimated: 58 mL/min — ABNORMAL LOW (ref >60)
Glucose, Bld: 178 mg/dL — ABNORMAL HIGH (ref 70–99)
Glucose, Bld: 156 mg/dL — ABNORMAL HIGH (ref 70–99)
Potassium: 3.3 mmol/L — ABNORMAL LOW (ref 3.5–5.1)
Potassium: 3.5 mmol/L (ref 3.5–5.1)
Sodium: 134 mmol/L — ABNORMAL LOW (ref 135–145)
Sodium: 133 mmol/L — ABNORMAL LOW (ref 135–145)

## 2023-05-29 LAB — CBC
HCT: 25.9 % — ABNORMAL LOW (ref 36.0–46.0)
Hemoglobin: 8.6 g/dL — ABNORMAL LOW (ref 12.0–15.0)
MCH: 29.6 pg (ref 26.0–34.0)
MCHC: 33.2 g/dL (ref 30.0–36.0)
MCV: 89 fL (ref 80.0–100.0)
Platelets: 128 10*3/uL — ABNORMAL LOW (ref 150–400)
RBC: 2.91 MIL/uL — ABNORMAL LOW (ref 3.87–5.11)
RDW: 16 % — ABNORMAL HIGH (ref 11.5–15.5)
WBC: 15.5 10*3/uL — ABNORMAL HIGH (ref 4.0–10.5)
nRBC: 0 % (ref 0.0–0.2)

## 2023-05-29 LAB — LACTIC ACID, PLASMA
Lactic Acid, Venous: 2.3 mmol/L (ref 0.5–1.9)
Lactic Acid, Venous: 1.5 mmol/L (ref 0.5–1.9)

## 2023-05-29 LAB — GLUCOSE, CAPILLARY
Glucose-Capillary: 124 mg/dL — ABNORMAL HIGH (ref 70–99)
Glucose-Capillary: 190 mg/dL — ABNORMAL HIGH (ref 70–99)
Glucose-Capillary: 145 mg/dL — ABNORMAL HIGH (ref 70–99)
Glucose-Capillary: 166 mg/dL — ABNORMAL HIGH (ref 70–99)

## 2023-05-29 LAB — PROCALCITONIN: Procalcitonin: 2.86 ng/mL

## 2023-05-29 LAB — MAGNESIUM: Magnesium: 1.6 mg/dL — ABNORMAL LOW (ref 1.7–2.4)

## 2023-05-29 LAB — PHOSPHORUS: Phosphorus: 2.9 mg/dL (ref 2.5–4.6)

## 2023-05-29 MED ORDER — MAGNESIUM SULFATE 2 GM/50ML IV SOLN
2.0000 g | Freq: Once | INTRAVENOUS | Status: AC
  Administered 2023-05-29: 2 g via INTRAVENOUS
  Filled 2023-05-29: qty 50

## 2023-05-29 MED ORDER — METOPROLOL TARTRATE 25 MG PO TABS: 12.5000 mg | ORAL_TABLET | Freq: Four times a day (QID) | ORAL | Status: DC

## 2023-05-29 MED ORDER — METOPROLOL TARTRATE 25 MG PO TABS
12.5000 mg | ORAL_TABLET | Freq: Two times a day (BID) | ORAL | Status: DC
  Administered 2023-05-29: 12.5 mg via ORAL
  Filled 2023-05-29: qty 1

## 2023-05-29 MED ORDER — POTASSIUM CHLORIDE 10 MEQ/100ML IV SOLN
10.0000 meq | INTRAVENOUS | Status: AC
  Administered 2023-05-29 (×3): 10 meq via INTRAVENOUS
  Filled 2023-05-29 (×3): qty 100

## 2023-05-29 NOTE — Progress Notes (Signed)
   Patient Name: Jenny [name] Date of Encounter: 05/29/2023 [facility] Cardiologist: Lorine Bears, MD   Interval Summary  .    More alert this morning, on BiPAP, trying to talk 90% FiO2 saturations in the 90s Hypotensive with systolics in the 90s Reports that she is hungry, on thickened liquid diet given mental status changes, aspiration risk  Vital Signs .    Vitals:   05/29/23 1100 05/29/23 1107 05/29/23 1130 05/29/23 1145  BP: (!) 101/36  (!) 95/37 (!) 93/57  Pulse: (!) 108  (!) 106 99  Resp: (!) 22  (!) 25 20  Temp:      TempSrc:      SpO2: 95% 97% 94% 90%  Weight:      Height:        Intake/Output Summary (Last 24 hours) at 05/29/2023 1154 Last data filed at 05/29/2023 0630 Gross per 24 hour  Intake 576.04 ml  Output 1340 ml  Net -763.96 ml      05/29/2023    5:00 AM 05/28/2023    5:00 AM 05/27/2023    3:40 AM  Last 3 Weights  Weight (lbs) 97 lb 10.6 oz 99 lb 13.9 oz 95 lb 14.4 oz  Weight (kg) 44.3 kg 45.3 kg 43.5 kg      Telemetry/ECG    Sinus tachycardia with rates 90-110s  - Personally Reviewed  Physical Exam .   GEN: Patient is not alert or awake.  Neck: No JVD Cardiac: Tachycardic, regular rhythm, no murmurs, rubs, or gallops.  Respiratory: Bibasalar crackles bilaterally. GI: Soft, nontender, non-distended  MS: No edema  Assessment & Plan .     Acute respiratory distress Pneumonia Aspiration pneumonia Sepsis Initial presentation with cough, shortness of breath, hypotension/lactic acidosis meeting sepsis criteria  CTA chest showed severed diffuse bilateral airspace disease with small effusions - Treated for PNA,  weaned to 1 L nasal cannula, however on 11/25 overnight respiratory status significantly declined, documented hypoxia transferred to ICU placed on CPAP, diuresis, antibiotics reinitiated -Yesterday relatively somnolent on BiPAP, after discussion with family decision made to avoid intubation -Remains on BiPAP  90% -Would continue IV Lasix twice daily, antibiotics -Palliative following  Valvular heart disease - S/p bioprosthetic aortic valve replacement - Echo 05/20/2023 showed LVEF 55-60%, bioprosthetic aortic valve with appropriate gradient of 15 mmHg, moderate mitral valve regurgitation and moderate pulmonary valve regurgitation - Patient is not a candidate for advanced therapies given respiratory distress, malnutrition, overall debility  NSTEMI - In the setting of respiratory distress, pneumonia, and sepsis - Troponin peak 848 on 05/20/2023, ischemic workup not performed in the setting of anemia, thrombocytopenia - On heparin for DVT prophylaxis - Telemetry showed sinus tachycardia -Low-dose beta-blocker metoprolol tartrate 12.5 every 6 -Nitropaste if tolerated   For questions or updates, please contact [facility] Please consult [email or web address] for contact info under        Signed, Julien Nordmann, MD

## 2023-05-29 NOTE — Progress Notes (Signed)
PHARMACY CONSULT NOTE - FOLLOW UP  Pharmacy Consult for Electrolyte Monitoring and Replacement   Recent Labs: Potassium (mmol/L)  Date Value  05/29/2023 3.3 (L)  10/24/2014 4.4   Magnesium (mg/dL)  Date Value  82/95/6213 1.6 (L)   Calcium (mg/dL)  Date Value  08/65/7846 7.3 (L)   Calcium, Total (mg/dL)  Date Value  96/29/5284 8.6 (L)   Albumin (g/dL)  Date Value  13/24/4010 2.3 (L)  05/23/2020 3.9  03/02/2012 3.2 (L)   Phosphorus (mg/dL)  Date Value  27/25/3664 2.9   Sodium (mmol/L)  Date Value  05/29/2023 134 (L)  05/23/2020 138  10/24/2014 134 (L)     Assessment: 77 y/o female with h/o severe aortic stenosis, CHF, DM, TIA/stroke, mood disorder, IBS, hypothyroidism, CAD, HLD, HTN, RA and anxiety who is admitted with PNA, sepsis, shock and AKI.   Diuretics: furosemide 40 mg IV BID  Goal of Therapy:  Electrolytes WNL  Plan:  ---2 grams IV magnesium sulfate x 1 ---10 mEq IV KCl x 3 ---recheck electrolytes in am  Lowella Bandy ,PharmD Clinical Pharmacist 05/29/2023 6:42 AM

## 2023-05-29 NOTE — Progress Notes (Signed)
PROGRESS NOTE    LISLIE GOHLKE  WUJ:[id number] DOB: 1946-01-09 DOA: 05/19/2023 PCP: Corky Downs, MD  [id number]/IC14A-AA  LOS: 9 days   Brief hospital course:   Assessment & Plan: 77 yo female who presented to [facility] ER on 11/18 via EMS with c/o "not feeling well", nausea, and vomiting. EMS reported when they arrived at pts home she was lethargic and diaphoretic with generalized weakness. She received iv fluids en route to the ER. Pts husband reports the pt has had a cough over the past 2 weeks, however her cough worsened today and she vomited bile prompting EMS notification.   Upon arrival to the ER pt hypotensive and tachycardic meeting sepsis criteria. She received 2L NS bolus/azithromycin/ceftriaxone, however she remained hypotensive requiring levophed gtt. CXR concerning for pneumonia.  PCCM team contacted for ICU admission.   Pt was transferred to hospitalist service on 05/22/23.  #Acute respiratory failure with hypoxia  --found to have PNA on presentation and completed a course of abx with ceftriaxone and azithromycin.  Supplemental oxygen was weaned down to 1L at one point, but requirement went up, and now needed heated hf at almost max levels. --CXR showed likely pulm edema.  Was started on IV lasix 40 BID --CTA chest neg for PE, and again showed "Severe diffuse bilateral airspace disease with small effusions. Findings could reflect edema or infection."  Pt had finished a course of abx recently, no fever, no leukocytosis, procal trending down, however given that respiratory status has not improved with diuresis, so started abx again. --PCCM consulted Plan: --cont diuresis --cont abx --Continue supplemental O2 to keep sats >=90% --husband confirmed no intubation  PNA --HCAP vs aspiration vs atypical --completed 5 days of ceftriaxone and azithromycin, but maybe PNA was not completed treated.  Started on zosyn and doxy on 11/26.  Started IV solumedrol for aspiration  pneumonitis. Plan: --cont zosyn and doxy --cont IV solumedrol  Pulm edema --CXR showed "widespread pulmonary interstitial opacity" likely pulm edema.   --IV lasix 40 mg BID started on 11/24, however, could not get accurate measurement of urine output, so Foley was inserted.   Plan: --cont IV lasix 40 BID --Foley to monitor urine output  Hypotension --BP intermittent low to 80's. --cont low-dose pressor to improve BP and help with diuresis.    Diplopia New today, right pupil constricted and fixed. After discussion w/ Dr. Otelia Limes of neurology will check CT of head and CTA head/neck  #Septic shock- PRESENT ON ADMISSION - DUE TO PNEUMONIA --hypotensive with lactic acidosis on arrival  --off pressor morning of 11/20, resumed on 11/25 as PRN.  NSTEMI --trop peaked at 848.  In the setting of respiratory distress, pneumonia, sepsis. --cardio consulted --Normal ejection fraction on echo  -Consider ischemic workup following recovery, could be arranged as outpatient   Aortic valve stenosis status post bioprosthetic aortic valve  --no issue  # Chronic systolic CHF, ruled out --current Echo showed normal LVEF --started on Toprol which is held currently due to hypotension.  Hx: CAD, HLD, Stroke --cont ASA --resume statin after discharge   # Mild acute kidney injury secondary ATN  --Cr peaked at 1.27.   --monitor Cr while diuresing  #Metabolic acidosis  #Lactic acidosis   #Hypokalemia  - monitor and supplement PRN  Hypophos --monitor and supplement PRN   #Transaminitis --trended down   # Hx of Iron deficiency anemia  #Acute on chronic thrombocytopenia  - Trend CBC  - Monitor for s/sx of bleeding  - Transfuse for hgb <7   #  Type II diabetes mellitus  Hyperglycemia due to steroid - Hemoglobin A1c only 5.3  --cont glargine 10u daily --ACHS and SSI for now  # IBS with diarrhea --pt reported at baseline, she takes Imodium for her diarrhea, minimum 2 mg x5 tabs per  day. --multiple episodes of diarrhea noted, C diff and GI path neg.  Rectal tube inserted on 11/21.  Received Imodium and still having liquid stool --cont Lomotil to 2 tabs QID   DVT prophylaxis: Heparin SQ Code Status: DNR No intubation   Family Communication: husband updated at bedside today Level of care: Stepdown Dispo:   The patient is from: home Anticipated d/c is to: SNF rehab Anticipated d/c date is: > 3 days   Subjective and Interval History:  Pt was able to get off CPAP today and be on heated hf.     Objective: Vitals:   05/29/23 1156 05/29/23 1200 05/29/23 1215 05/29/23 1500  BP: (!) 95/40 (!) 109/37 (!) 134/42   Pulse: (!) 104 98 94   Resp: (!) 24 19 (!) 24   Temp:      TempSrc:      SpO2: (!) 87% 97% 91% 95%  Weight:      Height:        Intake/Output Summary (Last 24 hours) at 05/29/2023 1540 Last data filed at 05/29/2023 1300 Gross per 24 hour  Intake 799.32 ml  Output 1370 ml  Net -570.68 ml   Filed Weights   05/27/23 0340 05/28/23 0500 05/29/23 0500  Weight: 43.5 kg 45.3 kg 44.3 kg    Examination:   Constitutional: NAD, sleeping but arousable HEENT: conjunctivae and lids normal, EOMI CV: No cyanosis.   RESP: on heated hf Psych: grouchy mood and affect.    Rectal tube with liquid stool   Data Reviewed: I have personally reviewed labs and imaging studies  Time spent: 50 minutes  Darlin Priestly, MD [facility] If 7PM-7AM, please contact night-coverage 05/29/2023, 3:40 PM

## 2023-05-29 NOTE — Plan of Care (Signed)
  Problem: Skin Integrity: Goal: Risk for impaired skin integrity will decrease Outcome: Progressing   Problem: Clinical Measurements: Goal: Will remain free from infection Outcome: Progressing   Problem: Nutrition: Goal: Adequate nutrition will be maintained Outcome: Not Progressing   Problem: Coping: Goal: Level of anxiety will decrease Outcome: Not Progressing  Patient calling out frequently at night, trouble with keeping cpap on due to anxiety

## 2023-05-29 NOTE — Progress Notes (Signed)
Daily Progress Note   Patient Name: Jenny [name]       Date: 05/29/2023 DOB: 08/16/1945  Age: 77 y.o. MRN#: [id number] Attending Physician: Darlin Priestly, MD Primary Care Physician: Corky Downs, MD Admit Date: 05/19/2023  Reason for Consultation/Follow-up: Establishing goals of care  Subjective: Patient is alert this morning - aggravated with thickened liquid diet and asking me to clean her face but otherwise no complaints  Length of Stay: 9  Current Medications: Scheduled Meds:   aspirin  81 mg Oral Daily   Chlorhexidine Gluconate Cloth  6 each Topical Daily   diphenoxylate-atropine  2 tablet Oral QID   donepezil  10 mg Oral QHS   feeding supplement (NEPRO CARB STEADY)  237 mL Oral BID BM   furosemide  40 mg Intravenous BID   heparin injection (subcutaneous)  5,000 Units Subcutaneous Q12H   insulin aspart  0-20 Units Subcutaneous TID WC   insulin aspart  0-5 Units Subcutaneous QHS   insulin glargine-yfgn  10 Units Subcutaneous Daily   ipratropium-albuterol  3 mL Nebulization Q4H   levothyroxine  88 mcg Oral Daily   methylPREDNISolone (SOLU-MEDROL) injection  40 mg Intravenous Daily   metoprolol tartrate  12.5 mg Oral BID   multivitamin with minerals  1 tablet Oral Q lunch   pantoprazole (PROTONIX) IV  40 mg Intravenous QHS   sertraline  100 mg Oral QHS   sodium chloride flush  10-40 mL Intracatheter Q12H   thiamine  100 mg Oral Daily    Continuous Infusions:  sodium chloride 10 mL/hr at 05/29/23 0630   doxycycline (VIBRAMYCIN) IV 100 mg (05/29/23 0111)   magnesium sulfate bolus IVPB     norepinephrine (LEVOPHED) Adult infusion 2 mcg/min (05/29/23 0630)   piperacillin-tazobactam (ZOSYN)  IV 3.375 g (05/29/23 0630)   potassium chloride      PRN Meds: sodium chloride,  acetaminophen, docusate sodium, guaiFENesin, ipratropium-albuterol, LORazepam, ondansetron (ZOFRAN) IV, mouth rinse, polyethylene glycol, sodium chloride flush  Physical Exam Constitutional:      Appearance: She is ill-appearing.     Comments: Frail, thin  Cardiovascular:     Rate and Rhythm: Tachycardia present.  Pulmonary:     Comments: Remains on HFNC 100%, 60L Skin:    General: Skin is warm and dry.  Neurological:     Mental Status: She is alert and oriented to person, place, and time.  Psychiatric:     Comments: Slightly agitated/anxious             Vital Signs: BP (!) 123/44   Pulse (!) 103   Temp 98.3 F (36.8 C) (Axillary)   Resp (!) 27   Ht 4\' 8"  (1.422 m)   Wt 44.3 kg   SpO2 98%   BMI 21.90 kg/m  SpO2: SpO2: 98 % O2 Device: O2 Device: CPAP O2 Flow Rate: O2 Flow Rate (L/min): 60 L/min  Intake/output summary:  Intake/Output Summary (Last 24 hours) at 05/29/2023 0941 Last data filed at 05/29/2023 0630 Gross per 24 hour  Intake 576.04 ml  Output 1640 ml  Net -1063.96 ml   LBM: Last BM Date : 05/28/23 Baseline Weight: Weight: 43.5 kg Most recent weight: Weight: 44.3 kg  Palliative Assessment/Data: PPS 40%      Patient Active Problem List   Diagnosis Date Noted   Acute hypoxic respiratory failure (HCC) 05/29/2023   Acute respiratory failure with hypoxia (HCC) 05/28/2023   Protein-calorie malnutrition, severe 05/22/2023   Non-STEMI (non-ST elevated myocardial infarction) (HCC) 05/21/2023   Mitral valve stenosis 05/21/2023   Cirrhosis of liver with ascites (HCC) 05/21/2023   Sepsis with acute organ dysfunction and septic shock (HCC) 05/19/2023   Polyclonal gammopathy 12/10/2021   Rheumatoid arthritis (HCC) 11/20/2020   Rotator cuff arthropathy of left shoulder 08/08/2020   Memory loss 06/18/2020   Axonal neuropathy 06/18/2020   Thrombocytopenia (HCC) 06/18/2020   Ataxia 04/25/2020   Essential hypertension 04/25/2020   Annual physical exam  03/02/2020   Irritable bowel syndrome    Hypothyroidism    Osteoporosis    Anxiety 01/17/2020   Tibia/fibula fracture, left, closed, initial encounter 05/19/2018   S/P AVR (aortic valve replacement) 02/17/2017   Type 2 diabetes mellitus with complication, without long-term current use of insulin (HCC) 01/29/2017   Mood disorder (HCC) 01/29/2017   Aortic valve stenosis 01/22/2017   Chronic systolic CHF (congestive heart failure) (HCC) 01/22/2017   Anemia 01/16/2017   Acute CHF (congestive heart failure) (HCC) 01/16/2017   Elevated troponin 01/16/2017   Hypokalemia 01/16/2017   Other forms of angina pectoris (HCC) 01/15/2017    Palliative Care Assessment & Plan   HPI: 77 y.o. female  with past medical history of CAD, HLD, stroke, severe aortic stenosis with aortic valve replacement (2018), TIA, type 2 diabetes, former smoker, and IM tibial nail placement (2019) admitted on 05/19/2023 with nausea, vomiting, and weakness.   Chest x-ray with multifocal opacity. Managed for community-acquired pneumonia and was able to be weaned down to 1 L nasal cannula. However overnight on 11/25 her oxygen requirement started going up chest x-ray with worsening bilateral opacities. Thought to be secondary to pulmonary edema and therefore was started on diuresis. Despite adequate diuresis her oxygen has not been improving. Currently on high flow and CPAP at 100% FiO2.    Echocardiogram on admission with normal LVEF, bioprosthetic aortic valve in place with a gradient of 15 mmHg. However there was moderate mitral valve regurgitation and moderate pulmonary valve regurgitation.  Cardiology following.   PMT was consulted to discuss goals of care.   Assessment: Follow up today with patient - she is alert this morning - slightly agitated with husband at bedside trying to assist with PO intake. She is frustrated with thickened liquids - we try to explain reasoning for thickened liquids. She expresses understanding  but continues to be frustrated.   She is on HFNC - very verbal. Asks me to clean her face but once cleaned she has no complaints.  Review with spouse conversations from yesterday with palliative team - for now, plan remains to continue current measures w/ no escalation. DNR/DNI. PMT will follow up again tomorrow.   Recommendations/Plan: DNR/DNI Continue course for now, no escalation in care - family remain hopeful for improvement in condition, much more interactive today, a bit agitated Frustrated with thickened liquids - messaging SLP  Code Status: DNR  Discharge Planning: To Be Determined  Care plan was discussed with patient's spouse, RN  Thank you for allowing the Palliative Medicine Team to assist in the care of this patient.   Total Time 40 minutes Prolonged Time Billed  no   Time spent includes: Detailed review of medical records (labs, imaging, vital signs), medically appropriate exam, discussion with  treatment team, counseling and educating patient, family and/or staff, documenting clinical information, medication management and coordination of care.     *Please note that this is a verbal dictation therefore any spelling or grammatical errors are due to the "[employer] One" system interpretation.  Gerlean Ren, DNP, [facility] Palliative Medicine Team Team Phone # [phone]  Pager [phone]

## 2023-05-29 NOTE — Progress Notes (Signed)
NAME:  Jenny [name], MRN:  [id number], DOB:  Jun 06, 1946, LOS: 9 ADMISSION DATE:  05/19/2023   History of Present Illness:  This is a case of a 77 year old female patient with a past medical history of aortic valve stenosis status post bioprosthetic aortic valve replacement, hypothyroidism, diabetes mellitus type 2 who presented to [facility] on 05/19/2023 with generally not feeling well shortness of breath nausea and vomiting.  Chest x-ray with multifocal opacity.  Managed for community-acquired pneumonia and was able to be weaned down to 1 L nasal cannula.  However overnight on 11/25 her oxygen requirement started going up chest x-ray with worsening bilateral opacities.  Thought to be secondary to pulmonary edema and therefore was started on diuresis.  Despite adequate diuresis her oxygen has not been improving.  Currently on high flow and CPAP at 100% FiO2.  Echocardiogram on admission with normal LVEF, bioprosthetic aortic valve in place with a gradient of 15 mmHg.  However there was moderate mitral valve regurgitation and moderate pulmonary valve regurgitation.  Pertinent  Medical History  []  Severer aortic stenosis s/p Bioprosthetic valve  []  Hypothyroidism  []  DM Type II   Significant Hospital Events: Including procedures, antibiotic start and stop dates in addition to other pertinent events   05/27/2023 - Started on Zosyn, doxycycline and systemic steroids.  05/28/2023 - Respiratory status worsened she required cpap at 100% FiO2.   Interim History / Subjective:  This AM. Patient with worsening respiratory status. Switched to CPAP at 100% FiO2.   Objective   Blood pressure (!) 123/44, pulse (!) 103, temperature 98.3 F (36.8 C), temperature source Axillary, resp. rate (!) 27, height 4\' 8"  (1.422 m), weight 44.3 kg, SpO2 98%.    FiO2 (%):  [75 %-100 %] 75 %   Intake/Output Summary (Last 24 hours) at 05/29/2023 0828 Last data filed at 05/29/2023  0630 Gross per 24 hour  Intake 576.04 ml  Output 1640 ml  Net -1063.96 ml   Filed Weights   05/27/23 0340 05/28/23 0500 05/29/23 0500  Weight: 43.5 kg 45.3 kg 44.3 kg    Examination: General: Elderly, cachectic in moderate respiratory distress  HENT: Supple neck, reactive pupils, EOMI  Lungs: Bibasilar crackles noted  Cardiovascular: Normal S1, Normal S2, tachycardic  Abdomen: Soft, non tender, non distended, + BS  Extremities: Cold and clammy   Labs and imaging were reviewed.   Assessment & Plan:  This is a case of a 77 year old female patient with a past medical history of aortic valve stenosis status post bioprosthetic aortic valve replacement, hypothyroidism, diabetes mellitus type 2 who presented to [facility] on 05/19/2023 with generally not feeling well shortness of breath nausea and vomiting.  Chest x-ray with multifocal opacity.  Managed for community-acquired pneumonia and was able to be weaned down to 1 L nasal cannula.  However overnight on 11/25 her oxygen requirement started going up chest x-ray with worsening bilateral opacities.  Thought to be secondary to pulmonary edema and therefore was started on diuresis.  Despite adequate diuresis her oxygen has not been improving.  Currently on high flow and CPAP at 100% FiO2.  Echcocardiogram with normal LVEF 55-60%, moderate mitral valve regurgitation, moderate mitral stenosis and moderate tricuspid valve regurgitation.   #Acute hypoxic respiratory failure in the setting of.. #Multifocal pneumonia now resolved c/b  #Pulmonary edema secondary to..  # Valvular heart disease with moderate mitral valve regurgitation and moderate steonsis # Hypokalemia in the setting of diuresis, hypocalcemia and hyponatremia  Neuro: Low dose precedex for anxiety  CVS: Conitnue with diuresis for now. Trial Metoprolol tartrate 12.5mg  BID. Trial  Nitropaste.   Lungs: Continue with CPAP support for spO2 greater than 90%.  Altrernate with HFNC. Decrease methylprednisolone 20mg  daily for now and once able can switch to prednisone 40mg  PO daily for a 10-14 days taper. C./w zosyn and doxycycline for 48 hrs total. Duonebs scheduled.  GI: NPO. PPI daily at bedtime. Swallow eval/. Renal: Diuresis as above for a goal negative 1L  Heme: On heparin for dvt prophylaxis   Best Practice (right click and "Reselect all SmartList Selections" daily)   Diet/type: NPO DVT prophylaxis: heparin injection 5,000 Units Start: 05/20/23 2200 SCDs Start: 05/19/23 1819   Pressure ulcer(s): not present on admission  GI prophylaxis: PPI Lines: Central line Foley:  Yes, and it is still needed Code Status:  full code  Discussed with family members at bedside about the current condition and prognosis as well as risk of induction for intubation given her valvular heart disease. They expressed understanding and wanted to wait for cardiology input before any decision is taken.   Last date of multidisciplinary goals of care discussion [05/28/2023]  I spent 50 minutes caring for this patient today, including preparing to see the patient, obtaining a medical history , reviewing a separately obtained history, performing a medically appropriate examination and/or evaluation, counseling and educating the patient/family/caregiver, ordering medications, tests, or procedures, referring and communicating with other health care professionals (not separately reported), documenting clinical information in the electronic health record, and independently interpreting results (not separately reported/billed) and communicating results to the patient/family/caregiver  Janann Colonel, MD [facility] 05/29/2023 8:28 AM

## 2023-05-30 ENCOUNTER — Inpatient Hospital Stay (HOSPITAL_COMMUNITY)
Admit: 2023-05-30 | Discharge: 2023-05-30 | Disposition: A | Discharge status: Home / Self Care | Payer: Medicare Other | Attending: Hospitalist

## 2023-05-30 ENCOUNTER — Inpatient Hospital Stay: Payer: Medicare Other

## 2023-05-30 DIAGNOSIS — R6521 Severe sepsis with septic shock: Secondary | ICD-10-CM | POA: Diagnosis not present

## 2023-05-30 DIAGNOSIS — I342 Nonrheumatic mitral (valve) stenosis: Secondary | ICD-10-CM

## 2023-05-30 DIAGNOSIS — J189 Pneumonia, unspecified organism: Secondary | ICD-10-CM

## 2023-05-30 DIAGNOSIS — J9601 Acute respiratory failure with hypoxia: Secondary | ICD-10-CM | POA: Diagnosis not present

## 2023-05-30 DIAGNOSIS — J9621 Acute and chronic respiratory failure with hypoxia: Secondary | ICD-10-CM

## 2023-05-30 DIAGNOSIS — E43 Unspecified severe protein-calorie malnutrition: Secondary | ICD-10-CM | POA: Diagnosis not present

## 2023-05-30 DIAGNOSIS — I350 Nonrheumatic aortic (valve) stenosis: Secondary | ICD-10-CM | POA: Diagnosis not present

## 2023-05-30 DIAGNOSIS — I214 Non-ST elevation (NSTEMI) myocardial infarction: Secondary | ICD-10-CM | POA: Diagnosis not present

## 2023-05-30 DIAGNOSIS — K7460 Unspecified cirrhosis of liver: Secondary | ICD-10-CM | POA: Diagnosis not present

## 2023-05-30 DIAGNOSIS — E876 Hypokalemia: Secondary | ICD-10-CM | POA: Diagnosis not present

## 2023-05-30 DIAGNOSIS — J811 Chronic pulmonary edema: Secondary | ICD-10-CM | POA: Diagnosis not present

## 2023-05-30 DIAGNOSIS — N179 Acute kidney failure, unspecified: Secondary | ICD-10-CM | POA: Diagnosis not present

## 2023-05-30 DIAGNOSIS — I340 Nonrheumatic mitral (valve) insufficiency: Secondary | ICD-10-CM | POA: Diagnosis not present

## 2023-05-30 DIAGNOSIS — A419 Sepsis, unspecified organism: Secondary | ICD-10-CM | POA: Diagnosis not present

## 2023-05-30 LAB — HEPATIC FUNCTION PANEL
ALT: 33 U/L (ref 0–44)
AST: 39 U/L (ref 15–41)
Albumin: 1.9 g/dL — ABNORMAL LOW (ref 3.5–5.0)
Alkaline Phosphatase: 134 U/L — ABNORMAL HIGH (ref 38–126)
Bilirubin, Direct: 0.2 mg/dL (ref 0.0–0.2)
Indirect Bilirubin: 0.8 mg/dL (ref 0.3–0.9)
Total Bilirubin: 1 mg/dL (ref <1.2)
Total Protein: 5.3 g/dL — ABNORMAL LOW (ref 6.5–8.1)

## 2023-05-30 LAB — CBC
HCT: 24.4 % — ABNORMAL LOW (ref 36.0–46.0)
Hemoglobin: 8.1 g/dL — ABNORMAL LOW (ref 12.0–15.0)
MCH: 29.8 pg (ref 26.0–34.0)
MCHC: 33.2 g/dL (ref 30.0–36.0)
MCV: 89.7 fL (ref 80.0–100.0)
Platelets: 101 10*3/uL — ABNORMAL LOW (ref 150–400)
RBC: 2.72 MIL/uL — ABNORMAL LOW (ref 3.87–5.11)
RDW: 16.1 % — ABNORMAL HIGH (ref 11.5–15.5)
WBC: 8.7 10*3/uL (ref 4.0–10.5)
nRBC: 0 % (ref 0.0–0.2)

## 2023-05-30 LAB — ECHOCARDIOGRAM COMPLETE
AR max vel: 0.98 cm2
AV Area VTI: 1.12 cm2
AV Area mean vel: 1.05 cm2
AV Mean grad: 13.7 mm[Hg]
AV Peak grad: 23.9 mm[Hg]
Ao pk vel: 2.45 m/s
Area-P 1/2: 2.31 cm2
Height: 56 in
MV VTI: 1.07 cm2
S' Lateral: 2.3 cm
Weight: 1544.98 [oz_av]

## 2023-05-30 LAB — BASIC METABOLIC PANEL
Anion gap: 8 (ref 5–15)
BUN: 37 mg/dL — ABNORMAL HIGH (ref 8–23)
CO2: 24 mmol/L (ref 22–32)
Calcium: 7.3 mg/dL — ABNORMAL LOW (ref 8.9–10.3)
Chloride: 102 mmol/L (ref 98–111)
Creatinine, Ser: 1.02 mg/dL — ABNORMAL HIGH (ref 0.44–1.00)
GFR, Estimated: 58 mL/min — ABNORMAL LOW (ref >60)
Glucose, Bld: 153 mg/dL — ABNORMAL HIGH (ref 70–99)
Potassium: 3.1 mmol/L — ABNORMAL LOW (ref 3.5–5.1)
Sodium: 134 mmol/L — ABNORMAL LOW (ref 135–145)

## 2023-05-30 LAB — GLUCOSE, CAPILLARY
Glucose-Capillary: 171 mg/dL — ABNORMAL HIGH (ref 70–99)
Glucose-Capillary: 102 mg/dL — ABNORMAL HIGH (ref 70–99)
Glucose-Capillary: 78 mg/dL (ref 70–99)
Glucose-Capillary: 188 mg/dL — ABNORMAL HIGH (ref 70–99)
Glucose-Capillary: 83 mg/dL (ref 70–99)

## 2023-05-30 LAB — PHOSPHORUS: Phosphorus: 3.4 mg/dL (ref 2.5–4.6)

## 2023-05-30 LAB — MAGNESIUM: Magnesium: 2 mg/dL (ref 1.7–2.4)

## 2023-05-30 MED ORDER — METHYLPREDNISOLONE SODIUM SUCC 40 MG IJ SOLR
40.0000 mg | Freq: Two times a day (BID) | INTRAMUSCULAR | Status: DC
  Administered 2023-05-30 – 2023-05-31 (×2): 40 mg via INTRAVENOUS
  Filled 2023-05-30 (×2): qty 1

## 2023-05-30 MED ORDER — NITROGLYCERIN 2 % TD OINT
0.5000 [in_us] | TOPICAL_OINTMENT | Freq: Four times a day (QID) | TRANSDERMAL | Status: DC
  Administered 2023-05-30: 0.5 [in_us] via TOPICAL
  Filled 2023-05-30: qty 1

## 2023-05-30 MED ORDER — METHYLPREDNISOLONE SODIUM SUCC 40 MG IJ SOLR
20.0000 mg | Freq: Every day | INTRAMUSCULAR | Status: DC
  Administered 2023-05-30: 20 mg via INTRAVENOUS
  Filled 2023-05-30: qty 1

## 2023-05-30 MED ORDER — POTASSIUM CHLORIDE 10 MEQ/100ML IV SOLN
10.0000 meq | INTRAVENOUS | Status: AC
  Administered 2023-05-30 (×4): 10 meq via INTRAVENOUS
  Filled 2023-05-30 (×4): qty 100

## 2023-05-30 NOTE — Plan of Care (Signed)
  Problem: Education: Goal: Ability to describe self-care measures that may prevent or decrease complications (Diabetes Survival Skills Education) will improve Outcome: Progressing   Problem: Coping: Goal: Ability to adjust to condition or change in health will improve Outcome: Progressing   Problem: Fluid Volume: Goal: Ability to maintain a balanced intake and output will improve Outcome: Progressing   Problem: Health Behavior/Discharge Planning: Goal: Ability to identify and utilize available resources and services will improve Outcome: Progressing Goal: Ability to manage health-related needs will improve Outcome: Progressing   Problem: Metabolic: Goal: Ability to maintain appropriate glucose levels will improve Outcome: Progressing   Problem: Nutritional: Goal: Progress toward achieving an optimal weight will improve Outcome: Progressing   Problem: Tissue Perfusion: Goal: Adequacy of tissue perfusion will improve Outcome: Progressing   Problem: Health Behavior/Discharge Planning: Goal: Ability to manage health-related needs will improve Outcome: Progressing   Problem: Clinical Measurements: Goal: Will remain free from infection Outcome: Progressing Goal: Diagnostic test results will improve Outcome: Progressing Goal: Cardiovascular complication will be avoided Outcome: Progressing   Problem: Elimination: Goal: Will not experience complications related to bowel motility Outcome: Progressing Goal: Will not experience complications related to urinary retention Outcome: Progressing   Problem: Pain Management: Goal: General experience of comfort will improve Outcome: Progressing   Problem: Safety: Goal: Ability to remain free from injury will improve Outcome: Progressing   Problem: Skin Integrity: Goal: Risk for impaired skin integrity will decrease Outcome: Progressing   Problem: Nutritional: Goal: Maintenance of adequate nutrition will improve Outcome:  Not Progressing   Problem: Skin Integrity: Goal: Risk for impaired skin integrity will decrease Outcome: Not Progressing   Problem: Education: Goal: Knowledge of General Education information will improve Description: Including pain rating scale, medication(s)/side effects and non-pharmacologic comfort measures Outcome: Not Progressing   Problem: Clinical Measurements: Goal: Ability to maintain clinical measurements within normal limits will improve Outcome: Not Progressing Goal: Respiratory complications will improve Outcome: Not Progressing   Problem: Activity: Goal: Risk for activity intolerance will decrease Outcome: Not Progressing   Problem: Nutrition: Goal: Adequate nutrition will be maintained Outcome: Not Progressing   Problem: Coping: Goal: Level of anxiety will decrease Outcome: Not Progressing

## 2023-05-30 NOTE — Progress Notes (Signed)
Around 0045, pt noted to desaturate to high 70s. Spo2 probe changed and value remained the same. Maxed out HHFNC settings without improvement. Pt placed back on CPAP and fio2 increased to 100%. Pt sats remained in mid 80s. Respiratory to bedside and changed pt to bipap settings; sats unchanged. ICU NP notified.

## 2023-05-30 NOTE — TOC Progression Note (Signed)
Transition of Care [facility]) - Progression Note    Patient Details  Name: Jenny [name] MRN: [id number] Date of Birth: [birth year]  Transition of [facility]) CM/SW Contact  Truddie Hidden, RN Phone Number: 05/30/2023, 1:41 PM  Clinical Narrative:    TOC continuing to follow patient's progress throughout discharge planning.   Expected Discharge Plan: Skilled Nursing Facility    Expected Discharge Plan and Services                                               Social Determinants of Health (SDOH) Interventions SDOH Screenings   Food Insecurity: No Food Insecurity (05/20/2023)  Housing: Patient Declined (05/20/2023)  Transportation Needs: No Transportation Needs (05/20/2023)  Utilities: Not At Risk (05/20/2023)  Alcohol Screen: Low Risk  (05/18/2021)  Depression (PHQ2-9): Low Risk  (12/11/2021)  Financial Resource Strain: Low Risk  (05/18/2021)  Physical Activity: Insufficiently Active (05/18/2021)  Social Connections: Socially Integrated (05/18/2021)  Stress: No Stress Concern Present (05/18/2021)  Tobacco Use: Medium Risk (05/19/2023)    Readmission Risk Interventions     No data to display

## 2023-05-30 NOTE — Progress Notes (Signed)
*  PRELIMINARY RESULTS* Echocardiogram 2D Echocardiogram has been performed.  Jenny [name] 05/30/2023, 1:14 PM

## 2023-05-30 NOTE — Plan of Care (Signed)
Attempt to wean patient from Bipap to heated high flow O2 was unsuccessful today. On HHFNC at 100% and 60 liters resulted in sats in 60's.  Received IV lasix this am. CVP checked per Dr. Mariah Milling,  and several times thereafter and found to be 1-2 mmHg. BP's borderline but within parameters of MAP 65 OR SBP 90. PM lasix dose held per Dr. Mariah Milling. Pt was in extreme distress for most of day, coughing, feeling as she were choking, and being unable to breathe and having increased work of breathing. Son at bedside for brief time this am when attempting to transition to [facility]. Husband at bedside most of afternoon. Pt appears more comfortable this afternoon, with sats in 90's, sleeping. Urine output 550 cc today via foley catheter. Received IV antibiotics, vibramycin and zosyn. Potassium replenishment 40 meq given, each in 100 cc IVF. Remains NPO due to bipap. Unable to take po meds at this time as well.  Problem: Fluid Volume: Goal: Ability to maintain a balanced intake and output will improve Outcome: Not Progressing   Problem: Nutritional: Goal: Maintenance of adequate nutrition will improve Outcome: Not Progressing Goal: Progress toward achieving an optimal weight will improve Outcome: Not Progressing   Problem: Skin Integrity: Goal: Risk for impaired skin integrity will decrease Outcome: Not Progressing   Problem: Tissue Perfusion: Goal: Adequacy of tissue perfusion will improve Outcome: Not Progressing   Problem: Clinical Measurements: Goal: Ability to maintain clinical measurements within normal limits will improve Outcome: Progressing Goal: Will remain free from infection Outcome: Progressing Goal: Diagnostic test results will improve Outcome: Not Progressing Goal: Respiratory complications will improve Outcome: Not Progressing Goal: Cardiovascular complication will be avoided Outcome: Progressing   Problem: Activity: Goal: Risk for activity intolerance will decrease Outcome: Not  Progressing   Problem: Nutrition: Goal: Adequate nutrition will be maintained Outcome: Not Progressing   Problem: Coping: Goal: Level of anxiety will decrease Outcome: Not Progressing   Problem: Elimination: Goal: Will not experience complications related to bowel motility Outcome: Progressing Goal: Will not experience complications related to urinary retention Outcome: Progressing   Problem: Pain Management: Goal: General experience of comfort will improve Outcome: Progressing

## 2023-05-30 NOTE — Progress Notes (Signed)
NAME:  Jenny [name], MRN:  [id number], DOB:  [birth year], LOS: 10 ADMISSION DATE:  05/19/2023   History of Present Illness:  This is a case of a 77 year old female patient with a past medical history of aortic valve stenosis status post bioprosthetic aortic valve replacement, hypothyroidism, diabetes mellitus type 2 who presented to [facility] on 05/19/2023 with generally not feeling well shortness of breath nausea and vomiting.  Chest x-ray with multifocal opacity.  Managed for community-acquired pneumonia and was able to be weaned down to 1 L nasal cannula.  However overnight on 11/25 her oxygen requirement started going up chest x-ray with worsening bilateral opacities.  Thought to be secondary to pulmonary edema and therefore was started on diuresis.  Despite adequate diuresis her oxygen has not been improving.  Currently on high flow and CPAP at 100% FiO2.  Echocardiogram on admission with normal LVEF, bioprosthetic aortic valve in place with a gradient of 15 mmHg.  However there was moderate mitral valve regurgitation and moderate pulmonary valve regurgitation.  Pertinent  Medical History  []  Severer aortic stenosis s/p Bioprosthetic valve  []  Hypothyroidism  []  DM Type II   Significant Hospital Events: Including procedures, antibiotic start and stop dates in addition to other pertinent events   05/27/2023 - Started on Zosyn, doxycycline and systemic steroids.  05/28/2023 - Respiratory status worsened she required cpap at 100% FiO2.   Interim History / Subjective:  This AM patient is drowsy, received ativan for agitation at night. Had to be placed on bipap.  Objective   Blood pressure (!) 117/48, pulse 87, temperature 98.2 F (36.8 C), temperature source Axillary, resp. rate 16, height 4\' 8"  (1.422 m), weight 43.8 kg, SpO2 93%.    FiO2 (%):  [80 %-100 %] 80 %   Intake/Output Summary (Last 24 hours) at 05/30/2023 0942 Last data filed at 05/30/2023  4098 Gross per 24 hour  Intake 894.17 ml  Output 1525 ml  Net -630.83 ml   Filed Weights   05/28/23 0500 05/29/23 0500 05/30/23 0600  Weight: 45.3 kg 44.3 kg 43.8 kg    Examination: General: Elderly, cachectic in moderate respiratory distress  HENT: Supple neck, reactive pupils, EOMI  Lungs: Bibasilar crackles noted  Cardiovascular: Normal S1, Normal S2, tachycardic  Abdomen: Soft, non tender, non distended, + BS  Extremities: Cold and clammy   Labs and imaging were reviewed.   Assessment & Plan:  This is a case of a 77 year old female patient with a past medical history of aortic valve stenosis status post bioprosthetic aortic valve replacement, hypothyroidism, diabetes mellitus type 2 who presented to [facility] on 05/19/2023 with generally not feeling well shortness of breath nausea and vomiting.  Chest x-ray with multifocal opacity.  Managed for community-acquired pneumonia and was able to be weaned down to 1 L nasal cannula.  However overnight on 11/25 her oxygen requirement started going up chest x-ray with worsening bilateral opacities.  Thought to be secondary to pulmonary edema and therefore was started on diuresis.  Despite adequate diuresis her oxygen has not been improving.  Currently on high flow and CPAP at 100% FiO2.  Echcocardiogram with normal LVEF 55-60%, moderate mitral valve regurgitation, moderate mitral stenosis and moderate tricuspid valve regurgitation.   #Acute hypoxic respiratory failure in the setting of.. #Multifocal pneumonia with possible pneumonitis and   #Pulmonary edema secondary to..  # Valvular heart disease with moderate mitral valve regurgitation and moderate steonsis # Hypokalemia in the setting of diuresis,  hypocalcemia and hyponatremia   Neuro: Low dose precedex for anxiety  CVS: Conitnue with diuresis for now. Can retrial Metoprolol tartrate 12.5mg  BID. T Lungs: Continue with CPAP/HFNC support for spO2 greater than 90%.  C/w methylprednisolone 20mg  daily for now and once able can switch to prednisone 40mg  PO daily for a 10-14 days taper. C./w zosyn and doxycycline for 48 hrs total. Duonebs scheduled.  GI: Passed swallow eval yesterday with modified soft diet.  Renal: Diuresis as above for a goal negative 1L  Heme: On heparin for dvt prophylaxis   Best Practice (right click and "Reselect all SmartList Selections" daily)   Diet/type: NPO DVT prophylaxis: heparin injection 5,000 Units Start: 05/20/23 2200 SCDs Start: 05/19/23 1819   Pressure ulcer(s): not present on admission  GI prophylaxis: PPI Lines: Central line Foley:  Yes, and it is still needed Code Status:  DNR/DNI  confirmed with husban and son.   Last date of multidisciplinary goals of care discussion [05/28/2023]  I spent 30 minutes caring for this patient today, including examintation, revieweing labs and imaging and documentation.   Janann Colonel, MD [place] [facility] 05/30/2023 9:42 AM

## 2023-05-30 NOTE — Progress Notes (Signed)
Palliative Care Progress Note, Assessment & Plan   Patient Name: Jenny [name]       Date: 05/30/2023 DOB: 10/11/1945  Age: 77 y.o. MRN#: [id number] Attending Physician: Darlin Priestly, MD Primary Care Physician: Corky Downs, MD Admit Date: 05/19/2023  Subjective: Patient is sitting up in bed with BiPAP in place.  She is moaning, saying she has something stuck in her throat.  Suction provided.  No relief.  RN plans to speak with respiratory to discuss suction.  Patient denies pain.  Patient's husband is at bedside during my visit.  HPI: 77 y.o. female  with past medical history of CAD, HLD, stroke, severe aortic stenosis with aortic valve replacement (2018), TIA, type 2 diabetes, former smoker, and IM tibial nail placement (2019) admitted on 05/19/2023 with nausea, vomiting, and weakness.   Chest x-ray with multifocal opacity. Managed for community-acquired pneumonia and was able to be weaned down to 1 L nasal cannula. However overnight on 11/25 her oxygen requirement started going up chest x-ray with worsening bilateral opacities. Thought to be secondary to pulmonary edema and therefore was started on diuresis. Despite adequate diuresis her oxygen has not been improving. Currently on high flow and CPAP at 100% FiO2.    Echocardiogram on admission with normal LVEF, bioprosthetic aortic valve in place with a gradient of 15 mmHg. However there was moderate mitral valve regurgitation and moderate pulmonary valve regurgitation.  Cardiology following.   PMT was consulted to discuss goals of care.   Summary of counseling/coordination of care: Extensive chart review completed prior to meeting patient including labs, vital signs, imaging, progress notes, orders, and available advanced directive documents from  current and previous encounters.   After reviewing the patient's chart and assessing the patient at bedside, with patient in regards to symptom management.  She is minimally interactive.  She continues to complain of something feeling stuck in her throat.  Oral care provided with Yankauer suction.  No relief.  Respiratory to evaluate if further suctioning would be appropriate.  For a brief amount of time that patient was off of BiPAP to do oral care, she desatted to 80.  After assessing the patient, I spoke with patient's husband in regards to plan and goals of care.  Patient's husband shares that he remains very concerned and worried about her.  He says he is not ready to give up on her yet.  Therapeutic silence, active listening, and emotional support provided.  I again discussed that DNR and DNI status remains appropriate.  He remains in agreement.  I also highlighted that we are not escalating her care.  He remains in agreement to see how she does.  He remains hopeful for improvement.  I again highlighted that patient has not shown improvement as she continues to require BiPAP.  However, she was able to be off of the BiPAP and stable for some amount of time yesterday.  He shares that he hopes she can come off of it again.  I again outlined that if patient is unable to come off of BiPAP then we would need to shift to comfort focused care.  He shares he understands but wants to allow more time to see if she  gets better.  Suffering, living and being kept alive, and human mortality discussed.  PMT will continue to follow and support patient throughout her hospitalization.  Physical Exam Vitals reviewed.  Constitutional:      General: She is in acute distress.     Appearance: She is normal weight. She is ill-appearing.  HENT:     Head: Normocephalic.     Mouth/Throat:     Mouth: Mucous membranes are dry.  Eyes:     Pupils: Pupils are equal, round, and reactive to light.  Pulmonary:      Comments: bipap Abdominal:     Palpations: Abdomen is soft.  Skin:    General: Skin is warm and dry.     Coloration: Skin is pale.  Psychiatric:        Mood and Affect: Mood normal.        Behavior: Behavior normal.             Total Time 35 minutes   Time spent includes: Detailed review of medical records (labs, imaging, vital signs), medically appropriate exam (mental status, respiratory, cardiac, skin), discussed with treatment team, counseling and educating patient, family and staff, documenting clinical information, medication management and coordination of care.  Samara Deist L. Bonita Quin, DNP, FNP-BC Palliative Medicine Team

## 2023-05-30 NOTE — Progress Notes (Signed)
PROGRESS NOTE    Jenny [name]  NWG:[id number] DOB: 1945-11-03 DOA: 05/19/2023 PCP: Corky Downs, MD  [id number]/[id number]-[id number]  LOS: 10 days   Brief hospital course:   Assessment & Plan: 77 yo female who presented to [facility] ER on 11/18 via EMS with c/o "not feeling well", nausea, and vomiting. EMS reported when they arrived at pts home she was lethargic and diaphoretic with generalized weakness. She received iv fluids en route to the ER. Pts husband reports the pt has had a cough over the past 2 weeks, however her cough worsened today and she vomited bile prompting EMS notification.   Upon arrival to the ER pt hypotensive and tachycardic meeting sepsis criteria. She received 2L NS bolus/azithromycin/ceftriaxone, however she remained hypotensive requiring levophed gtt. CXR concerning for pneumonia.  PCCM team contacted for ICU admission.   Pt was transferred to hospitalist service on 05/22/23.  #Acute respiratory failure with hypoxia 2/2 PNA vs pulm edema vs aspiration pneumonitis vs autoimmune pneumonitis --found to have PNA on presentation and completed a course of abx with ceftriaxone and azithromycin.  Supplemental oxygen was weaned down to 1L at one point, but requirement went up, and now needed heated hf at almost max levels alternating with BiPAP. --CXR showed likely pulm edema.  Was started on IV lasix 40 BID --CTA chest neg for PE, and again showed "Severe diffuse bilateral airspace disease with small effusions. Findings could reflect edema or infection."  Pt had finished a course of abx recently, no fever, no leukocytosis, procal trending down, however given that respiratory status has not improved with diuresis, so started abx again. --PCCM consulted --Repeat Echo similar to prior.  Imaging studies continued to show worsening interstitial markings.  Pneumonitis is in the Ddx. Plan: --cont diuresis --cont abx --pneumonitis workup --Continue supplemental O2 to keep sats  >=90% --husband confirmed no intubation  PNA pneumonitis --HCAP vs aspiration vs atypical --completed 5 days of ceftriaxone and azithromycin, but maybe PNA was not completed treated.  Started on zosyn and doxy on 11/26.  Started IV solumedrol for aspiration pneumonitis. Plan: --cont zosyn and doxy --increase IV solumedrol to 40 mg BID --basic autoimmune workup  Pulm edema --CXR showed "widespread pulmonary interstitial opacity" likely pulm edema.   --IV lasix 40 mg BID started on 11/24, however, respiratory status has not improved even though heart and kidney function are relatively stable. Plan: --cont IV lasix 40 BID --Foley to monitor urine output  Hypotension --BP intermittent low to 80's.  Diplopia New on 11/27, right pupil constricted and fixed. After discussion w/ Dr. Otelia Limes of neurology will check CT of head and CTA head/neck  #Septic shock- PRESENT ON ADMISSION - DUE TO PNEUMONIA --hypotensive with lactic acidosis on arrival  --off pressor morning of 11/20, resumed on 11/25 as PRN.  NSTEMI --trop peaked at 848.  In the setting of respiratory distress, pneumonia, sepsis. --cardio consulted --Normal ejection fraction on echo  -Consider ischemic workup following recovery, could be arranged as outpatient   Aortic valve stenosis status post bioprosthetic aortic valve  --no issue  # Chronic systolic CHF, ruled out --current Echo showed normal LVEF --started on Toprol which is held currently due to hypotension.  Hx: CAD, HLD, Stroke --cont ASA --resume statin after discharge   # Mild acute kidney injury secondary ATN  --Cr peaked at 1.27.   --monitor Cr while diuresing  #Metabolic acidosis  #Lactic acidosis   #Hypokalemia  - monitor and supplement PRN  Hypophos --monitor and supplement PRN   #Transaminitis --  trended down   # Hx of Iron deficiency anemia  #Acute on chronic thrombocytopenia  - Trend CBC  - Monitor for s/sx of bleeding  - Transfuse  for hgb <7   #Type II diabetes mellitus  Hyperglycemia due to steroid - Hemoglobin A1c only 5.3  --cont glargine 10u daily --ACHS and SSI for now  # IBS with diarrhea --pt reported at baseline, she takes Imodium for her diarrhea, minimum 2 mg x5 tabs per day. --multiple episodes of diarrhea noted, C diff and GI path neg.  Rectal tube inserted on 11/21.  Received scheduled Imodium and Lomotil.   DVT prophylaxis: Heparin SQ Code Status: DNR No intubation   Family Communication: husband updated at bedside today Level of care: Stepdown Dispo:   The patient is from: home Anticipated d/c is to: SNF rehab Anticipated d/c date is: > 3 days   Subjective and Interval History:  Pt was back on BiPAP again today.     Objective: Vitals:   05/30/23 1529 05/30/23 1600 05/30/23 1630 05/30/23 1700  BP: (!) 101/39 (!) 97/43 (!) 101/45 (!) 103/44  Pulse: 81 84 83 83  Resp: 20 20 17 20   Temp:      TempSrc:      SpO2: 94% 95% 96% 96%  Weight:      Height:        Intake/Output Summary (Last 24 hours) at 05/30/2023 1751 Last data filed at 05/30/2023 1700 Gross per 24 hour  Intake 1350.15 ml  Output 1690 ml  Net -339.85 ml   Filed Weights   05/28/23 0500 05/29/23 0500 05/30/23 0600  Weight: 45.3 kg 44.3 kg 43.8 kg    Examination:   Constitutional: NAD, somnolent CV: No cyanosis.   RESP: on BiPAP SKIN: warm, dry  Rectal tube with no liquid stool today   Data Reviewed: I have personally reviewed labs and imaging studies  Time spent: 50 minutes  Darlin Priestly, MD [facility] If 7PM-7AM, please contact night-coverage 05/30/2023, 5:51 PM

## 2023-05-30 NOTE — Progress Notes (Signed)
PHARMACY CONSULT NOTE - FOLLOW UP  Pharmacy Consult for Electrolyte Monitoring and Replacement   Recent Labs: Potassium (mmol/L)  Date Value  05/30/2023 3.1 (L)  10/24/2014 4.4   Magnesium (mg/dL)  Date Value  16/04/9603 2.0   Calcium (mg/dL)  Date Value  54/03/8118 7.3 (L)   Calcium, Total (mg/dL)  Date Value  14/78/2956 8.6 (L)   Albumin (g/dL)  Date Value  21/30/8657 2.3 (L)  05/23/2020 3.9  03/02/2012 3.2 (L)   Phosphorus (mg/dL)  Date Value  84/69/6295 3.4   Sodium (mmol/L)  Date Value  05/30/2023 134 (L)  05/23/2020 138  10/24/2014 134 (L)     Assessment: 77 y/o female with h/o severe aortic stenosis, CHF, DM, TIA/stroke, mood disorder, IBS, hypothyroidism, CAD, HLD, HTN, RA and anxiety who is admitted with PNA, sepsis, shock and AKI.   Diuretics: furosemide 40 mg IV BID  Goal of Therapy:  Electrolytes WNL  Plan:  ---10 mEq IV KCl x 4 ---recheck electrolytes in am  Lowella Bandy ,PharmD Clinical Pharmacist 05/30/2023 7:03 AM

## 2023-05-30 NOTE — Progress Notes (Signed)
Patient Name: Jenny [name] Date of Encounter: 05/30/2023 [facility] Cardiologist: Lorine Bears, MD   Interval Summary  .    Not much change in the past 48 hours, remains on BiPAP 100%, difficult to wean Per nurses attempt to use facemask, she was more hypoxic Difficulty tolerating/swallowing medications, high aspiration risk per nurses -CVP this morning via PICC line estimated  1-2 Low potassium being repleted through several rounds of IV Last 24 hours -500, this admission essentially even   Vital Signs .    Vitals:   05/30/23 1143 05/30/23 1200 05/30/23 1230 05/30/23 1245  BP:  (!) 113/46 (!) 110/53 (!) 113/46  Pulse:  89 89 88  Resp:  (!) 22 (!) 28 (!) 22  Temp: 98.2 F (36.8 C)     TempSrc: Axillary     SpO2:  97% (!) 88% 97%  Weight:      Height:        Intake/Output Summary (Last 24 hours) at 05/30/2023 1308 Last data filed at 05/30/2023 1300 Gross per 24 hour  Intake 1088.24 ml  Output 1465 ml  Net -376.76 ml      05/30/2023    6:00 AM 05/29/2023    5:00 AM 05/28/2023    5:00 AM  Last 3 Weights  Weight (lbs) 96 lb 9 oz 97 lb 10.6 oz 99 lb 13.9 oz  Weight (kg) 43.8 kg 44.3 kg 45.3 kg      Telemetry/ECG    Sinus tachycardia with rates 80-100  - Personally Reviewed  Physical Exam .   Constitutional: Thin, weak, cachectic, BiPAP in place HENT:  Head: Grossly normal Eyes:  no discharge. No scleral icterus.  Neck: Unable to estimate JVD, no carotid bruits  Cardiovascular: Regular rate and rhythm, no murmurs appreciated Pulmonary/Chest: Coarse breath sounds bilaterally Abdominal: Soft.  no distension.  no tenderness.  Musculoskeletal: Normal range of motion Neurological:  normal muscle tone. Coordination normal. No atrophy Skin: Skin warm and dry Psychiatric: Somnolent but arousable   Assessment & Plan .     Acute respiratory distress Aspiration pneumonia Sepsis Initial presentation with cough, shortness of breath,  hypotension/lactic acidosis meeting sepsis criteria  CTA chest showed severed diffuse bilateral airspace disease with small effusions - Treated for PNA,  weaned to 1 L nasal cannula, however on 11/25 overnight respiratory status significantly declined, documented hypoxia transferred to ICU placed on CPAP, diuresis, antibiotics reinitiated -Past several days with somnolence, requiring BiPAP 100% for respiratory support Family decision to avoid intubation -Has been treated with IV Lasix twice daily, antibiotics, poor urine output -CVP done this morning nurses estimate 1-2 -Repeat echo pending -Poor candidate for right heart catheterization given respiratory distress  Valvular heart disease - S/p bioprosthetic aortic valve replacement - Echo 05/20/2023 showed LVEF 55-60%, bioprosthetic aortic valve with appropriate gradient of 15 mmHg, moderate mitral valve regurgitation and moderate pulmonary valve regurgitation - Patient is not a candidate for advanced therapies given respiratory distress, malnutrition, overall debility/cachexia  NSTEMI - In the setting of respiratory distress, pneumonia, and sepsis - Troponin peak 848 on 05/20/2023, ischemic workup not performed in the setting of anemia, thrombocytopenia - On heparin for DVT prophylaxis - Telemetry showed sinus tachycardia -Low-dose metoprolol tartrate started yesterday, this appears to have been discontinued, blood pressure stable.  Nurses reports she is not taking pills well -Nitropaste if tolerated   For questions or updates, please contact [facility] Please consult [email or web address] for contact info under  Signed, Julien Nordmann, MD

## 2023-05-31 DIAGNOSIS — J9621 Acute and chronic respiratory failure with hypoxia: Secondary | ICD-10-CM | POA: Diagnosis not present

## 2023-05-31 DIAGNOSIS — R6521 Severe sepsis with septic shock: Secondary | ICD-10-CM | POA: Diagnosis not present

## 2023-05-31 DIAGNOSIS — E876 Hypokalemia: Secondary | ICD-10-CM | POA: Diagnosis not present

## 2023-05-31 DIAGNOSIS — J9601 Acute respiratory failure with hypoxia: Secondary | ICD-10-CM | POA: Diagnosis not present

## 2023-05-31 DIAGNOSIS — J811 Chronic pulmonary edema: Secondary | ICD-10-CM | POA: Diagnosis not present

## 2023-05-31 DIAGNOSIS — A419 Sepsis, unspecified organism: Secondary | ICD-10-CM | POA: Diagnosis not present

## 2023-05-31 DIAGNOSIS — Z515 Encounter for palliative care: Secondary | ICD-10-CM | POA: Diagnosis not present

## 2023-05-31 DIAGNOSIS — E43 Unspecified severe protein-calorie malnutrition: Secondary | ICD-10-CM | POA: Diagnosis not present

## 2023-05-31 DIAGNOSIS — N179 Acute kidney failure, unspecified: Secondary | ICD-10-CM | POA: Diagnosis not present

## 2023-05-31 LAB — GLUCOSE, CAPILLARY
Glucose-Capillary: 119 mg/dL — ABNORMAL HIGH (ref 70–99)
Glucose-Capillary: 156 mg/dL — ABNORMAL HIGH (ref 70–99)
Glucose-Capillary: 156 mg/dL — ABNORMAL HIGH (ref 70–99)
Glucose-Capillary: 166 mg/dL — ABNORMAL HIGH (ref 70–99)
Glucose-Capillary: 168 mg/dL — ABNORMAL HIGH (ref 70–99)
Glucose-Capillary: 38 mg/dL — CL (ref 70–99)
Glucose-Capillary: 43 mg/dL — CL (ref 70–99)

## 2023-05-31 LAB — CBC
HCT: 23.5 % — ABNORMAL LOW (ref 36.0–46.0)
Hemoglobin: 7.7 g/dL — ABNORMAL LOW (ref 12.0–15.0)
MCH: 29.4 pg (ref 26.0–34.0)
MCHC: 32.8 g/dL (ref 30.0–36.0)
MCV: 89.7 fL (ref 80.0–100.0)
Platelets: 86 10*3/uL — ABNORMAL LOW (ref 150–400)
RBC: 2.62 MIL/uL — ABNORMAL LOW (ref 3.87–5.11)
RDW: 16.1 % — ABNORMAL HIGH (ref 11.5–15.5)
WBC: 7.9 10*3/uL (ref 4.0–10.5)
nRBC: 0 % (ref 0.0–0.2)

## 2023-05-31 LAB — BASIC METABOLIC PANEL
Anion gap: 10 (ref 5–15)
BUN: 41 mg/dL — ABNORMAL HIGH (ref 8–23)
CO2: 22 mmol/L (ref 22–32)
Calcium: 7.6 mg/dL — ABNORMAL LOW (ref 8.9–10.3)
Chloride: 105 mmol/L (ref 98–111)
Creatinine, Ser: 1.19 mg/dL — ABNORMAL HIGH (ref 0.44–1.00)
GFR, Estimated: 48 mL/min — ABNORMAL LOW (ref >60)
Glucose, Bld: 177 mg/dL — ABNORMAL HIGH (ref 70–99)
Potassium: 3.6 mmol/L (ref 3.5–5.1)
Sodium: 137 mmol/L (ref 135–145)

## 2023-05-31 LAB — MAGNESIUM: Magnesium: 1.9 mg/dL (ref 1.7–2.4)

## 2023-05-31 LAB — PHOSPHORUS: Phosphorus: 3.8 mg/dL (ref 2.5–4.6)

## 2023-05-31 MED ORDER — SODIUM CHLORIDE 0.9 % IV SOLN
500.0000 mg | Freq: Once | INTRAVENOUS | Status: AC
  Administered 2023-05-31: 500 mg via INTRAVENOUS
  Filled 2023-05-31: qty 5

## 2023-05-31 MED ORDER — AZITHROMYCIN 250 MG PO TABS: 250.0000 mg | ORAL_TABLET | Freq: Every day | ORAL | Status: DC

## 2023-05-31 MED ORDER — SODIUM CHLORIDE 0.9 % IV SOLN
1000.0000 mg | Freq: Every day | INTRAVENOUS | Status: DC
  Administered 2023-05-31 – 2023-06-01 (×2): 1000 mg via INTRAVENOUS
  Filled 2023-05-31 (×4): qty 16

## 2023-05-31 MED ORDER — DEXTROSE 50 % IV SOLN
25.0000 g | INTRAVENOUS | Status: AC
Start: 2023-05-31 — End: 2023-05-31
  Administered 2023-05-31: 25 g via INTRAVENOUS

## 2023-05-31 MED ORDER — DEXTROSE 50 % IV SOLN
INTRAVENOUS | Status: AC
  Filled 2023-05-31: qty 50

## 2023-05-31 MED ORDER — DEXMEDETOMIDINE HCL IN NACL 400 MCG/100ML IV SOLN
0.0000 ug/kg/h | INTRAVENOUS | Status: DC
  Administered 2023-05-31: 0.4 ug/kg/h via INTRAVENOUS
  Administered 2023-06-01: 0.8 ug/kg/h via INTRAVENOUS
  Administered 2023-06-01 – 2023-06-02 (×2): 1.2 ug/kg/h via INTRAVENOUS
  Filled 2023-05-31 (×4): qty 100

## 2023-05-31 NOTE — Progress Notes (Signed)
Chaplain at bedside

## 2023-05-31 NOTE — Progress Notes (Signed)
Blood sugar noted to be 43, and on recheck found to be 38. 1 amp D50 given per protocol.

## 2023-05-31 NOTE — Progress Notes (Signed)
Follow up blood sugar check=168

## 2023-05-31 NOTE — Progress Notes (Addendum)
SLP Cancellation Note  Patient Details Name: Jenny [name] MRN: [id number] DOB: 1945/07/03   Cancelled treatment:       Reason Eval/Treat Not Completed:  (chart reviewed; discussion w/ Palliative Care NP)  Per Palliative Care discussion, pt continues to present w/ significantly declined Pulmonary status requiring increased amounts of O2 support: BiPAP to heated HFNC O2 w/ eaning unsuccessful so far. Pt has been maintained NPO per medical Team in recent shifts d/t Pulmonary status. ST services recommends NPO status as well d/t increased risk for aspiration/aspiration pneumonia w/ current Pulmonary status/presentation. Per NP, pt continues to request po intake. Pt has a current diet order for a dysphagia diet w/ nectar liquids per MD(see last ST session recommendation).   ST services will leave any further po/diet orders to be determined by MD, medical Team and/or w/ Palliative Care w/ pt/husband per pt's GOC.  F/u at her next venue of care is recommended, if indicated then. Recommend oral care frequently during the day.      Jerilynn Som, MS, CCC-SLP Speech Language Pathologist Rehab Services; [facility] [phone] (ascom) Jenny [name] 05/31/2023, 2:37 PM

## 2023-05-31 NOTE — Progress Notes (Signed)
NAME:  Jenny [name], MRN:  [id number], DOB:  September 24, 1945, LOS: 11 ADMISSION DATE:  05/19/2023   History of Present Illness:  This is a case of a 77 year old female patient with a past medical history of aortic valve stenosis status post bioprosthetic aortic valve replacement, hypothyroidism, diabetes mellitus type 2 who presented to [facility] on 05/19/2023 with generally not feeling well shortness of breath nausea and vomiting.  Chest x-ray with multifocal opacity.  Managed for community-acquired pneumonia and was able to be weaned down to 1 L nasal cannula.  However overnight on 11/25 her oxygen requirement started going up chest x-ray with worsening bilateral opacities.  Thought to be secondary to pulmonary edema and therefore was started on diuresis.  Despite adequate diuresis her oxygen has not been improving.  Currently on high flow and CPAP at 100% FiO2.  Echocardiogram on admission with normal LVEF, bioprosthetic aortic valve in place with a gradient of 15 mmHg.  However there was moderate mitral valve regurgitation and moderate pulmonary valve regurgitation.  Pertinent  Medical History  []  Severer aortic stenosis s/p Bioprosthetic valve  []  Hypothyroidism  []  DM Type II   Significant Hospital Events: Including procedures, antibiotic start and stop dates in addition to other pertinent events   05/27/2023 - Started on Zosyn, doxycycline and systemic steroids.  05/28/2023 - Respiratory status worsened she required cpap at 100% FiO2.  05/30/2023 - Repeat echocardiogram with normal LVEF - mild to moderate mitral stenosis and regurgitation and mild elvation in PASP.  05/31/2023 Pulse dose steroids.  Interim History / Subjective:  This AM patient more alert and awake. Remains on 80mg  Methylpred. Remains on HFNC at 100% alternating with bipap.  Objective   Blood pressure (!) 131/53, pulse (!) 101, temperature (!) 97.5 F (36.4 C), temperature source Axillary,  resp. rate (!) 29, height 4\' 8"  (1.422 m), weight 43.8 kg, SpO2 93%. CVP:  [0 mmHg-85 mmHg] 85 mmHg  FiO2 (%):  [70 %-100 %] 100 %   Intake/Output Summary (Last 24 hours) at 05/31/2023 1434 Last data filed at 05/31/2023 1200 Gross per 24 hour  Intake 623.94 ml  Output 850 ml  Net -226.06 ml   Filed Weights   05/28/23 0500 05/29/23 0500 05/30/23 0600  Weight: 45.3 kg 44.3 kg 43.8 kg    Examination: General: Elderly, cachectic in moderate respiratory distress  HENT: Supple neck, reactive pupils, EOMI  Lungs: Bibasilar crackles noted  Cardiovascular: Normal S1, Normal S2, tachycardic  Abdomen: Soft, non tender, non distended, + BS  Extremities: Cold and clammy   Labs and imaging were reviewed.   Assessment & Plan:  This is a case of a 77 year old female patient with a past medical history of aortic valve stenosis status post bioprosthetic aortic valve replacement, hypothyroidism, diabetes mellitus type 2 who presented to [facility] on 05/19/2023 with generally not feeling well shortness of breath nausea and vomiting.  Chest x-ray with multifocal opacity.  Managed for community-acquired pneumonia and was able to be weaned down to 1 L nasal cannula.  However overnight on 11/25 her oxygen requirement started going up chest x-ray with worsening bilateral opacities.  Thought to be secondary to pulmonary edema and therefore was started on diuresis.  Despite adequate diuresis her oxygen has not been improving.  Currently on high flow and CPAP at 100% FiO2.  Echcocardiogram with normal LVEF 55-60%, moderate mitral valve regurgitation, moderate mitral stenosis and moderate tricuspid valve regurgitation.   Mycoplasma IgM +. Repeat Echo  with mild to moderate valvular disease. Normal LVEF. Overall picture now concerning of OP vs AIP.   #Acute hypoxic respiratory failure in the setting of.. #Multifocal pneumonia with possible organizing pneumonia vs AIP   #Pulmonary edema  secondary to..  # Valvular heart disease with moderate mitral valve regurgitation and moderate steonsis # Hypokalemia in the setting of diuresis, hypocalcemia and hyponatremia   Neuro: Low dose precedex for anxiety  CVS: consider continuing diuresis at lower rates. Specially that we are pulsing her with steroids.  Lungs: Continue with CPAP/HFNC support for spO2 greater than 90%. Azithromycin for 5 days. Start Pulse dose steroids today for 3 days. And then 1mg /kg methylprednisolone.  GI: Passed swallow eval yesterday with modified soft diet.  Renal: Diuresis as above  Heme: On heparin for dvt prophylaxis   Best Practice (right click and "Reselect all SmartList Selections" daily)   Diet/type: Can feed once when off bipap on HFNC. DVT prophylaxis: heparin injection 5,000 Units Start: 05/20/23 2200 SCDs Start: 05/19/23 1819  Pressure ulcer(s): not present on admission  GI prophylaxis: PPI Lines: Central line Foley:  Yes, and it is still needed Code Status:  DNR/DNI  confirmed with husban and son.   Last date of multidisciplinary goals of care discussion [05/28/2023]  I spent 30 minutes caring for this patient today, including examintation, revieweing labs and imaging and documentation.   Janann Colonel, MD [facility] 05/31/2023 2:34 PM

## 2023-05-31 NOTE — Progress Notes (Signed)
PROGRESS NOTE    Jenny [name]  YQM:[id number] DOB: Jun 06, 1946 DOA: 05/19/2023 PCP: Corky Downs, MD  [id number]/[id number]  LOS: 11 days   Brief hospital course:   Assessment & Plan: 77 yo female who presented to [facility] ER on 11/18 via EMS with c/o "not feeling well", nausea, and vomiting. EMS reported when they arrived at pts home she was lethargic and diaphoretic with generalized weakness. She received iv fluids en route to the ER. Pts husband reports the pt has had a cough over the past 2 weeks, however her cough worsened and she vomited bile prompting EMS notification.   Upon arrival to the ER pt hypotensive and tachycardic meeting sepsis criteria. She received 2L NS bolus/azithromycin/ceftriaxone, however she remained hypotensive requiring levophed gtt. CXR concerning for pneumonia.  PCCM team contacted for ICU admission.   Pt was transferred to hospitalist service on 05/22/23.  Later PCCM consulted again for worsening respiratory failure.  #Acute respiratory failure with hypoxia 2/2 Mycoplasma PNA --found to have PNA on presentation and completed a course of abx with ceftriaxone and azithromycin.  Supplemental oxygen was weaned down to 1L at one point, but requirement went up, and now needed heated hf at almost max levels alternating with BiPAP. --CXR showed likely pulm edema.  Was started on IV lasix 40 BID, however, didn't improve. --CTA chest neg for PE, and again showed "Severe diffuse bilateral airspace disease with small effusions. Findings could reflect edema or infection."  Pt had finished a course of abx recently, no fever, no leukocytosis, procal trending down, however given that respiratory status has not improved with diuresis, pt was started abx again. --Repeat Echo similar to prior.  Imaging studies continued to show worsening interstitial markings.  Pneumonitis is in the Ddx. --covid and RVP x2 were neg, however, Mycoplasam IgM returned pos.  Per PCCM, this is likely a  recent exposure and what we are dealing with now is organizing pneumonia. Plan: --d/c diuresis today --d/c doxy and zosyn, and start another course of azithromycin today --increase steroid to IV solumedrol 1000 mg for 3 days, per PCCM rec.  Hypotension --BP intermittent low to 80's.  Dysphagia --pt is at increased aspiration risk and given tenuous respiratory status, SLP rec NPO on 11/27, however, pt insists on having oral intake, therefore, diet ordered as dys 1 with nectar thick.  Pt and family were advised of the risks.  Diplopia New on 11/27, right pupil constricted and fixed. After discussion w/ Dr. Otelia Limes of neurology, ordered CT of head and CTA head/neck which had no acute finding.  #Septic shock- PRESENT ON ADMISSION - DUE TO PNEUMONIA --hypotensive with lactic acidosis on arrival  --off pressor morning of 11/20, resumed on 11/25 as PRN.  NSTEMI --trop peaked at 848.  In the setting of respiratory distress, pneumonia, sepsis. --cardio consulted --Normal ejection fraction on echo  -Consider ischemic workup following recovery, could be arranged as outpatient   Aortic valve stenosis status post bioprosthetic aortic valve  --no issue  # Chronic systolic CHF, ruled out --current Echo showed normal LVEF --started on Toprol which is held currently due to hypotension.  Hx: CAD, HLD, Stroke --cont ASA --resume statin after discharge   # Mild acute kidney injury secondary ATN  --Cr peaked at 1.27.   --monitor Cr while diuresing  #Metabolic acidosis  #Lactic acidosis   #Hypokalemia  - monitor and supplement PRN  Hypophos --monitor and supplement PRN   #Transaminitis --trended down   # Hx of Iron deficiency anemia  #  Acute on chronic thrombocytopenia  - Trend CBC  - Monitor for s/sx of bleeding  - Transfuse for hgb <7   #Type II diabetes mellitus  Hyperglycemia due to steroid Hypoglycemia due to poor oral intake - Hemoglobin A1c only 5.3  --cont glargine 10u  daily --d/c SSI   # IBS with diarrhea --pt reported at baseline, she takes Imodium for her diarrhea, minimum 2 mg x5 tabs per day. --multiple episodes of diarrhea noted, C diff and GI path neg.  Rectal tube inserted on 11/21.  Received scheduled Imodium and Lomotil.   DVT prophylaxis: Heparin SQ Code Status: DNR No intubation   Family Communication: husband updated at bedside today Level of care: Stepdown Dispo:   The patient is from: home Anticipated d/c is to: SNF rehab Anticipated d/c date is: > 3 days   Subjective and Interval History:  Alternated between heated hf and BiPAP.  When pt was awake, she was feisty.     Objective: Vitals:   05/31/23 1608 05/31/23 1611 05/31/23 1631 05/31/23 1700  BP: (!) 129/47  (!) 112/55 (!) 143/53  Pulse: 99 97  (!) 107  Resp:  (!) 28  20  Temp:  97.6 F (36.4 C)    TempSrc:  Axillary    SpO2: 91% 92%  91%  Weight:      Height:        Intake/Output Summary (Last 24 hours) at 05/31/2023 1825 Last data filed at 05/31/2023 1800 Gross per 24 hour  Intake 712.8 ml  Output 1025 ml  Net -312.2 ml   Filed Weights   05/28/23 0500 05/29/23 0500 05/30/23 0600  Weight: 45.3 kg 44.3 kg 43.8 kg    Examination:   Constitutional: NAD, alert, oriented to person and place HEENT: conjunctivae and lids normal, EOMI CV: No cyanosis.   RESP: on heated hf, sating 80% Extremities: No effusions, edema in BLE SKIN: warm, dry Neuro: II - XII grossly intact.   Foley present Rectal tube with some loose stool   Data Reviewed: I have personally reviewed labs and imaging studies  Time spent: 50 minutes  Darlin Priestly, MD [facility] If 7PM-7AM, please contact night-coverage 05/31/2023, 6:25 PM

## 2023-05-31 NOTE — Progress Notes (Signed)
Pt remains anxious, pulls off and shifts bipap mask on and off. Admits to only partial relief from ativan. Son at bedside.

## 2023-05-31 NOTE — Progress Notes (Signed)
Upon nurse request Chaplain provide Jenny [name] a supportive visit to identify her religious needs. Jenny [name] shared her religious connection is to [employer]. She requested chaplain call church to request visit. Chaplain called Dorena Bodo and left a message. Jenny [name] desired prayer which was offered as well as a comfort touch blessing. Jenny [name] was tearful and thankful for care. Spiritual care services available 24/7 upon request.     05/31/23 1800  Spiritual Encounters  Type of Visit Follow up  Care provided to: Patient  Conversation partners present during encounter Nurse  Referral source Nurse (RN/NT/LPN)  Reason for visit End-of-life  OnCall Visit Yes  Spiritual Framework  Presenting Themes Meaning/purpose/sources of inspiration

## 2023-05-31 NOTE — Progress Notes (Signed)
Patient Name: Jenny [name] Date of Encounter: 05/31/2023 [facility] Cardiologist: Lorine Bears, MD   Interval Summary  .    Feeling uncomfortable.  Denies chest pain.   Vital Signs .    Vitals:   05/31/23 0830 05/31/23 0900 05/31/23 0930 05/31/23 1000  BP:  (!) 120/47  (!) 123/50  Pulse: 98 96 94 96  Resp: (!) 24 19 (!) 21 19  Temp:      TempSrc:      SpO2: (!) 80% (!) 89% (!) 89% (!) 87%  Weight:      Height:        Intake/Output Summary (Last 24 hours) at 05/31/2023 1028 Last data filed at 05/31/2023 1000 Gross per 24 hour  Intake 950.79 ml  Output 850 ml  Net 100.79 ml      05/30/2023    6:00 AM 05/29/2023    5:00 AM 05/28/2023    5:00 AM  Last 3 Weights  Weight (lbs) 96 lb 9 oz 97 lb 10.6 oz 99 lb 13.9 oz  Weight (kg) 43.8 kg 44.3 kg 45.3 kg      Telemetry/ECG    Sinus rhythm.  No events.  - Personally Reviewed  Echo 05/31/23:   1. Left ventricular ejection fraction, by estimation, is 60 to 65%. The  left ventricle has normal function. The left ventricle has no regional  wall motion abnormalities. There is mild left ventricular hypertrophy.  Left ventricular diastolic parameters  are consistent with Grade I diastolic dysfunction (impaired relaxation).   2. Right ventricular systolic function is normal. The right ventricular  size is normal. There is mildly elevated pulmonary artery systolic  pressure. The estimated right ventricular systolic pressure is 36.8 mmHg.   3. The mitral valve is normal in structure. Mild to moderate mitral valve  regurgitation. Mild to moderate mitral stenosis. The mean mitral valve  gradient is 9.0 mmHg.   4. Tricuspid valve regurgitation is moderate.   5. The aortic valve has been repaired/replaced. Aortic valve  regurgitation is not visualized. No aortic stenosis is present. Aortic  valve mean gradient measures 13.7 mmHg. Aortic valve Vmax measures 2.45  m/s.   6. The inferior vena cava is normal in  size with greater than 50%  respiratory variability, suggesting right atrial pressure of 3 mmHg.   Physical Exam .    VS:  BP (!) 123/50   Pulse 96   Temp (!) 97.5 F (36.4 C) (Axillary)   Resp 19   Ht 4\' 8"  (1.422 m)   Wt 43.8 kg   SpO2 (!) 87%   BMI 21.65 kg/m  , BMI Body mass index is 21.65 kg/m. GENERAL:  Frail.  Ill-appearing HEENT: Pupils equal round and reactive, fundi not visualized, oral mucosa unremarkable NECK:  + JVP 2 cm above clavicle at 45 degrees .  waveform within normal limits, carotid upstroke brisk and symmetric, no bruits, no thyromegaly LUNGS:  Clear to auscultation bilaterally HEART:  RRR.  PMI not displaced or sustained,S1 and S2 within normal limits, no S3, no S4, no clicks, no rubs, no murmurs ABD:  Flat, positive bowel sounds normal in frequency in pitch, no bruits, no rebound, no guarding, no midline pulsatile mass, no hepatomegaly, no splenomegaly EXT:  2 plus pulses throughout, no edema, no cyanosis no clubbing SKIN:  No rashes no nodules NEURO:  Cranial nerves II through XII grossly intact, motor grossly intact throughout PSYCH:  Cognitively intact, oriented to person place and time  Assessment & Plan .     26F with HFpEF, s/p bioprosthetic AVR,  hypertension, diabetes, CVA admitted with septic shock 2/2 pneumonia and demand ischemia vs NSTEMI.  # Hypoxic respiratory failure:  # Elevated troponin:  # HFpEF:  Likely demand ischemia.  LVEF this admission 60-65%.  She appears to be euvolemic to dry on exam and RA pressure 3 mmHg on echo.  CXR reports pulmonary edema. However, suspect this may be viral and no longer pulmonary edema.  Renal function worsening with diuresis.  He hypoxia is out of proportion to what would be expected fro her degree of volume overload.  She is scheduled for one more does of IV lasix today.  Will check BNP in the am.  High risk of AKI with aggressive attempts at diuresis.   # s/p AVR:  Valve stable on echo this admission.    # CVA:  Continue aspirin.    For questions or updates, please contact [facility] Please consult [email or web address] for contact info under        Signed, Chilton Si, MD

## 2023-05-31 NOTE — Progress Notes (Signed)
Daily Progress Note   Patient Name: Jenny [name]       Date: 05/31/2023 DOB: 08-Aug-1945  Age: 77 y.o. MRN#: [id number] Attending Physician: Darlin Priestly, MD Primary Care Physician: Corky Downs, MD Admit Date: 05/19/2023  Reason for Consultation/Follow-up: Establishing goals of care  HPI/Brief Hospital Review: 77 y.o. female  with past medical history of CAD, HLD, stroke, severe aortic stenosis with aortic valve replacement (2018), TIA, type 2 diabetes, former smoker, and IM tibial nail placement (2019) admitted on 05/19/2023 with nausea, vomiting, and weakness.   Chest x-ray with multifocal opacity. Managed for community-acquired pneumonia and was able to be weaned down to 1 L nasal cannula. However overnight on 11/25 her oxygen requirement started going up chest x-ray with worsening bilateral opacities. Thought to be secondary to pulmonary edema and therefore was started on diuresis. Despite adequate diuresis her oxygen has not been improving. Currently on high flow and CPAP at 100% FiO2.    Echocardiogram on admission with normal LVEF, bioprosthetic aortic valve in place with a gradient of 15 mmHg. However there was moderate mitral valve regurgitation and moderate pulmonary valve regurgitation.  Cardiology following.   PMT was consulted to discuss goals of care.   Subjective: Extensive chart review has been completed prior to meeting patient including labs, vital signs, imaging, progress notes, orders, and available advanced directive documents from current and previous encounters.    Received request from nursing staff to visit with patient as she continues to complain of being placed on and off bipap and continually requesting food/drink.  Visited with Jenny [name] at her bedside. She  is awake, opn HHFNC 60L 100% with O2 sats upper 70's-80's. Coached Jenny [name] on deep breathing and relaxation, sats improved to mid 80's, sats did not sustain with conversation or minimal movement. Jenny [name] continually requesting something to eat and drink, complains of dry mouth. Discussed with Jenny [name] in detail high risk for aspiration due to compromised respiratory status as well as risk for needing bipap, able to voice understanding of concept but continues to repeatedly ask for drink/food. Provided her with multiple swabs dipped in juice at her request. After swabbing, Jenny [name] would remove left over liquid from mouth with washcloth as she complained of difficulty swallowing. Again, reviewed with her high risk of aspiration even potentially on saliva.  Returned to bedside, sats remain low 80's, decision made to place back on bipap. Jenny [name] refusing to place bipap at that time. Husband-Jenny [name] now visiting at bedside. Discussed in detail with Jenny [name] the concern for Jenny [name] ongoing need for high levels of oxygen requirements as well as ongoing need for bipap. Discussed in detail with Jenny [name] inability to provide Ms. Kvamme with PO food or drink due to high risk for aspiration. Jenny [name] at beside during Ms. Heidel refusal of bipap although she did comply but expressed much discomfort once mask placed. Encouraged Jenny [name] to reach out to family as he shared Jenny [name] son would like to be involved in decision making conversations.  Suffering, living and being kept alive, and human mortality discussed.  Jenny [name] requests time for processing and wanting to have conversations with family members. He shares her son is aware of hospitalization and that he has been visiting daily but that he is not aware of severity of situation.  Answered and addressed all questions and concerns. PMT to continue to follow for ongoing needs and support.  Care plan was discussed with nursing staff.  Thank you for allowing the  Palliative Medicine Team to assist in the care of this patient.  Total time:  65 minutes  Time spent includes: Detailed review of medical records (labs, imaging, vital signs), medically appropriate exam (mental status, respiratory, cardiac, skin), discussed with treatment team, counseling and educating patient, family and staff, documenting clinical information, medication management and coordination of care.  Leeanne Deed, DNP, AGNP-C Palliative Medicine   Please contact Palliative Medicine Team phone at [phone] for questions and concerns.

## 2023-05-31 NOTE — Progress Notes (Signed)
Pt has decided to try being on bipap for right now, but requested something to help her tolerate it;states feels like she is "in a knot", and that something to help her relax, and take a nap would help. RT at bedside to place bipap. IV solumedrol 1000 mg infusion started.

## 2023-06-01 ENCOUNTER — Inpatient Hospital Stay: Payer: Medicare Other

## 2023-06-01 DIAGNOSIS — J9601 Acute respiratory failure with hypoxia: Secondary | ICD-10-CM | POA: Diagnosis not present

## 2023-06-01 DIAGNOSIS — R6521 Severe sepsis with septic shock: Secondary | ICD-10-CM | POA: Diagnosis not present

## 2023-06-01 DIAGNOSIS — E43 Unspecified severe protein-calorie malnutrition: Secondary | ICD-10-CM | POA: Diagnosis not present

## 2023-06-01 DIAGNOSIS — I350 Nonrheumatic aortic (valve) stenosis: Secondary | ICD-10-CM | POA: Diagnosis not present

## 2023-06-01 DIAGNOSIS — A419 Sepsis, unspecified organism: Secondary | ICD-10-CM | POA: Diagnosis not present

## 2023-06-01 DIAGNOSIS — J9621 Acute and chronic respiratory failure with hypoxia: Secondary | ICD-10-CM | POA: Diagnosis not present

## 2023-06-01 DIAGNOSIS — N179 Acute kidney failure, unspecified: Secondary | ICD-10-CM | POA: Diagnosis not present

## 2023-06-01 DIAGNOSIS — Z515 Encounter for palliative care: Secondary | ICD-10-CM | POA: Diagnosis not present

## 2023-06-01 LAB — BASIC METABOLIC PANEL
Anion gap: 9 (ref 5–15)
BUN: 45 mg/dL — ABNORMAL HIGH (ref 8–23)
CO2: 23 mmol/L (ref 22–32)
Calcium: 7.8 mg/dL — ABNORMAL LOW (ref 8.9–10.3)
Chloride: 111 mmol/L (ref 98–111)
Creatinine, Ser: 1.14 mg/dL — ABNORMAL HIGH (ref 0.44–1.00)
GFR, Estimated: 51 mL/min — ABNORMAL LOW (ref >60)
Glucose, Bld: 178 mg/dL — ABNORMAL HIGH (ref 70–99)
Potassium: 3.2 mmol/L — ABNORMAL LOW (ref 3.5–5.1)
Sodium: 143 mmol/L (ref 135–145)

## 2023-06-01 LAB — CBC
HCT: 21.9 % — ABNORMAL LOW (ref 36.0–46.0)
Hemoglobin: 7.3 g/dL — ABNORMAL LOW (ref 12.0–15.0)
MCH: 29.6 pg (ref 26.0–34.0)
MCHC: 33.3 g/dL (ref 30.0–36.0)
MCV: 88.7 fL (ref 80.0–100.0)
Platelets: 101 10*3/uL — ABNORMAL LOW (ref 150–400)
RBC: 2.47 MIL/uL — ABNORMAL LOW (ref 3.87–5.11)
RDW: 16.1 % — ABNORMAL HIGH (ref 11.5–15.5)
WBC: 3.8 10*3/uL — ABNORMAL LOW (ref 4.0–10.5)
nRBC: 0 % (ref 0.0–0.2)

## 2023-06-01 LAB — BRAIN NATRIURETIC PEPTIDE: B Natriuretic Peptide: 2100.2 pg/mL — ABNORMAL HIGH (ref 0.0–100.0)

## 2023-06-01 LAB — GLUCOSE, CAPILLARY
Glucose-Capillary: 176 mg/dL — ABNORMAL HIGH (ref 70–99)
Glucose-Capillary: 159 mg/dL — ABNORMAL HIGH (ref 70–99)
Glucose-Capillary: 150 mg/dL — ABNORMAL HIGH (ref 70–99)
Glucose-Capillary: 121 mg/dL — ABNORMAL HIGH (ref 70–99)

## 2023-06-01 LAB — MAGNESIUM: Magnesium: 1.9 mg/dL (ref 1.7–2.4)

## 2023-06-01 LAB — PHOSPHORUS: Phosphorus: 4.2 mg/dL (ref 2.5–4.6)

## 2023-06-01 LAB — RHEUMATOID FACTOR: Rheumatoid fact SerPl-aCnc: 72.1 [IU]/mL — ABNORMAL HIGH (ref <14.0)

## 2023-06-01 MED ORDER — FUROSEMIDE 10 MG/ML IJ SOLN
40.0000 mg | Freq: Once | INTRAMUSCULAR | Status: AC
  Administered 2023-06-01: 40 mg via INTRAVENOUS
  Filled 2023-06-01: qty 4

## 2023-06-01 NOTE — Plan of Care (Addendum)
Patient continues to be unable to maintain oxygen saturation while on HHF, patient placed back on cpap shortly after shift change with precedex gtt being started to help the patient tolerate cpap. Family at bedside during initiation of precedex, education provided to patient and family concerning precedex gtt. Family did verbalize understanding. Patient does have a diet ordered but has been unable to tolerate PO intake, oral care provided multiple times this shift at the request of the patient. Foley catheter draining yellow urine. Rectal tube noted with dark green/black stool. Patient turned per policy to help prevent skin breakdown.  Problem: Coping: Goal: Ability to adjust to condition or change in health will improve Outcome: Not Progressing   Problem: Fluid Volume: Goal: Ability to maintain a balanced intake and output will improve Outcome: Progressing   Problem: Health Behavior/Discharge Planning: Goal: Ability to identify and utilize available resources and services will improve Outcome: Progressing   Problem: Metabolic: Goal: Ability to maintain appropriate glucose levels will improve Outcome: Progressing   Problem: Nutritional: Goal: Maintenance of adequate nutrition will improve Outcome: Not Progressing   Problem: Skin Integrity: Goal: Risk for impaired skin integrity will decrease Outcome: Progressing

## 2023-06-01 NOTE — Progress Notes (Signed)
PROGRESS NOTE    Jenny [name]   AJO:[id number] DOB: 10-25-1945  DOA: 05/19/2023 Date of Service: 06/01/23 which is hospital day 12  PCP: Corky Downs, MD    HPI: 77 yo female who presented to [facility] ER on 11/18 via EMS with c/o "not feeling well", nausea, and vomiting. EMS reported when they arrived at pts home she was lethargic and diaphoretic with generalized weakness. She received iv fluids en route to the ER. Pts husband reports the pt has had a cough over the past 2 weeks, however her cough worsened and she vomited bile prompting EMS notification.    Hospital course / significant events:  11/18: To ED w/ hypotensive shock and respiratory failure. Admitted to ICU for septic shock d/t pneumonia requiring levophed gtt.  11/19: cardiology consult, chest pain / elevated troponin NSTEMI d/t sepsis unlikely significant underlying ischemia but consider outpatient w/u  11/20: off pressors  11/21: transferred to hospitalist service 11/24: O2 requirement back up, needing high flow, CXR showing pulmonary edema, diuresis was increased  11/25: Pt desat on 15L switched to heated hf, transferred to stepdown. Surprisingly, pt reported no shortness of breath and was not in distress, however, was intermittently somnolent. CTA Chest negative for PE. 11/26:  Persistent high FiO2 requirements. Concern for recurrent aspiration, CXR with worsening aeration concerning for possible evolving multilobar bilateral bronchopneumonia.  Started empiric Doxycycline and Zoysn. High risk for intubation. Added steroids. PCCM consulted. 11/27: resp status worsening, requiring CPAP. See IPAL note, DNR code status / goals. Mycoplasma IgM positive  11/29: continued confusion/agitation, requiring precedex gtt. Repeat echocardiogram with normal LVEF - mild to moderate mitral stenosis and regurgitation and mild elvation in PASP.  11/30: Pulse dose steroids. Started another round azithromycin for mycoplasma  12/01: d/w family  - best case scenario we give her a few days and her oxygenation recovers and can start eating, worst case she does not recover or worsens in which case we can assume her body is not going to respond to treatment. Consider NG tube if not worse over the next few days, holding for now d/t mental status / agitation she is not likely to tolerate it   Consultants:  ICU Cardiology  Neurology Palliative Care   Procedures/Surgeries: none      ASSESSMENT & PLAN:   Acute respiratory failure with hypoxia 2/2 Mycoplasma PNA Mycoplasam IgM(+) --> Per PCCM, this is likely a recent exposure and what we are dealing with now is organizing pneumonia. Started another course of azithromycin 11/30 increase steroid to IV solumedrol 1000 mg for 3 days, per PCCM rec.    Dysphagia pt is at increased aspiration risk and given tenuous respiratory status SLP rec NPO on 11/27, however, pt insists on having oral intake, so diet ordered as dys 1 with nectar thick.   Pt and family have been advised of the risks of aspiration. Would avoid placing NG for now given agitation    Diplopia New on 11/27, right pupil constricted and fixed. After discussion w/ Dr. Otelia Limes of neurology, ordered CT of head and CTA head/neck which had no acute finding. Monitor for changes  Resolved Septic shock- PRESENT ON ADMISSION - DUE TO PNEUMONIA See hospital course   Resolved NSTEMI trop peaked at 848.  In the setting of respiratory distress, pneumonia, sepsis. cardio consulted Normal ejection fraction on echo  Consider ischemic workup following recovery, could be arranged as outpatient    Aortic valve stenosis status post bioprosthetic aortic valve  Follow outpatient  Chronic systolic CHF, exacerbation has been ruled out GDMT as BP/renal function tolerates    Hx: CAD, HLD, Stroke cont ASA resume statin after discharge   Mild acute kidney injury secondary ATN  monitor Cr while diuresing   Metabolic acidosis   Lactic acidosis  Resolved Repeat las as needed   Hypokalemia  monitor and supplement PRN   Hypophos monitor and supplement PRN   Transaminitis trended down Monitor periodic CMP/hepatic panel    Hx of Iron deficiency anemia  cute on chronic thrombocytopenia  Trend CBC  Monitor for s/sx of bleeding  Transfuse for hgb <7   Type II diabetes mellitus  Hyperglycemia due to steroid Hypoglycemia due to poor oral intake Hemoglobin A1c only 5.3  Holding insulin while NPO  Glc q4h   IBS with diarrhea pt reported at baseline, she takes Imodium for her diarrhea, minimum 2 mg x5 tabs per day. multiple episodes of diarrhea noted, C diff and GI path neg.   Rectal tube inserted on 11/21.   Received scheduled Imodium and Lomotil.      Normal weight based on BMI: Body mass index is 21.4 kg/m.  Underweight - under 18.5  normal weight - 18.5 to 24.9 overweight - 25 to 29.9 obese - 30 or more   DVT prophylaxis: heparin IV fluids: no continuous IV fluids - diuresing per cardiology  Nutrition: dyspagia, recs for NPO d/t aspiration risk, see above  Central lines / invasive devices: rectal tube  Code Status: DNR ACP documentation reviewed: 06/01/23 and none on file in [facility]  TOC needs: Anticiapte SNF rehab if recovering  Barriers to dispo / significant pending items: respiratory status              Subjective / Brief ROS:  Patient reports wants something cold/wet to suck on, asking for a spoon   Family Communication: spoke w/ husband and other family at bedside on rounds see hospital course     Objective Findings:  Vitals:   06/01/23 0630 06/01/23 0700 06/01/23 0728 06/01/23 0730  BP: (!) 119/46 (!) 119/47 (!) 124/53 (!) 124/53  Pulse: 77 76 78 80  Resp: (!) 25 (!) 24 (!) 24 (!) 23  Temp:      TempSrc:      SpO2: 90% (!) 89% 90% 90%  Weight:      Height:        Intake/Output Summary (Last 24 hours) at 06/01/2023 1539 Last data filed at 06/01/2023  1530 Gross per 24 hour  Intake 269.3 ml  Output 1520 ml  Net -1250.7 ml   Filed Weights   05/29/23 0500 05/30/23 0600 06/01/23 0500  Weight: 44.3 kg 43.8 kg 43.3 kg    Examination:  Physical Exam Constitutional:      General: She is not in acute distress.    Appearance: She is ill-appearing.     Comments: Thin, frail  Cardiovascular:     Rate and Rhythm: Regular rhythm. Tachycardia present.  Pulmonary:     Effort: No respiratory distress.     Comments: BiPap mask in place, coarse breath sounds  Musculoskeletal:     Right lower leg: No edema.     Left lower leg: No edema.  Skin:    General: Skin is warm and dry.  Neurological:     General: No focal deficit present.     Mental Status: She is alert.  Psychiatric:     Comments: Agitated, pulling at BiPap mask, appears anxious/angry  Scheduled Medications:   aspirin  81 mg Oral Daily   azithromycin  250 mg Oral Daily   Chlorhexidine Gluconate Cloth  6 each Topical Daily   donepezil  10 mg Oral QHS   feeding supplement (NEPRO CARB STEADY)  237 mL Oral BID BM   heparin injection (subcutaneous)  5,000 Units Subcutaneous Q12H   ipratropium-albuterol  3 mL Nebulization Q4H   levothyroxine  88 mcg Oral Daily   multivitamin with minerals  1 tablet Oral Q lunch   pantoprazole (PROTONIX) IV  40 mg Intravenous QHS   sertraline  100 mg Oral QHS   sodium chloride flush  10-40 mL Intracatheter Q12H   thiamine  100 mg Oral Daily    Continuous Infusions:  dexmedetomidine (PRECEDEX) IV infusion 1 mcg/kg/hr (06/01/23 1500)   methylPREDNISolone (SOLU-MEDROL) injection Stopped (06/01/23 1330)    PRN Medications:  acetaminophen, docusate sodium, guaiFENesin, ipratropium-albuterol, ondansetron (ZOFRAN) IV, mouth rinse, polyethylene glycol, sodium chloride flush  Antimicrobials from admission:  Anti-infectives (From admission, onward)    Start     Dose/Rate Route Frequency Ordered Stop   06/01/23 1000  azithromycin  (ZITHROMAX) tablet 250 mg       Placed in "Followed by" Linked Group   250 mg Oral Daily 05/31/23 0908 06/05/23 0959   05/31/23 1000  azithromycin (ZITHROMAX) 500 mg in sodium chloride 0.9 % 250 mL IVPB       Placed in "Followed by" Linked Group   500 mg 250 mL/hr over 60 Minutes Intravenous  Once 05/31/23 0908 05/31/23 1248   05/27/23 2000  metroNIDAZOLE (FLAGYL) IVPB 500 mg  Status:  Discontinued        500 mg 100 mL/hr over 60 Minutes Intravenous Every 12 hours 05/27/23 0921 05/27/23 1124   05/27/23 1400  piperacillin-tazobactam (ZOSYN) IVPB 3.375 g  Status:  Discontinued        3.375 g 12.5 mL/hr over 240 Minutes Intravenous Every 8 hours 05/27/23 1126 05/31/23 1432   05/27/23 1200  ceFEPIme (MAXIPIME) 2 g in sodium chloride 0.9 % 100 mL IVPB  Status:  Discontinued        2 g 200 mL/hr over 30 Minutes Intravenous Every 12 hours 05/27/23 0921 05/27/23 1124   05/27/23 1200  doxycycline (VIBRAMYCIN) 100 mg in dextrose 5 % 250 mL IVPB  Status:  Discontinued        100 mg 125 mL/hr over 120 Minutes Intravenous Every 12 hours 05/27/23 1126 05/31/23 0908   05/27/23 0900  Ampicillin-Sulbactam (UNASYN) 3 g in sodium chloride 0.9 % 100 mL IVPB  Status:  Discontinued        3 g 200 mL/hr over 30 Minutes Intravenous Every 12 hours 05/27/23 0741 05/27/23 0920   05/21/23 1800  azithromycin (ZITHROMAX) tablet 500 mg        500 mg Oral Daily-1800 05/21/23 0930 05/23/23 1758   05/19/23 1715  cefTRIAXone (ROCEPHIN) 2 g in sodium chloride 0.9 % 100 mL IVPB        2 g 200 mL/hr over 30 Minutes Intravenous Every 24 hours 05/19/23 1703 05/23/23 2018   05/19/23 1715  azithromycin (ZITHROMAX) 500 mg in dextrose 5 % 250 mL IVPB  Status:  Discontinued        500 mg 250 mL/hr over 60 Minutes Intravenous Every 24 hours 05/19/23 1703 05/21/23 0930           Data Reviewed:  I have personally reviewed the following...  CBC: Recent Labs  Lab 05/28/23 0451 05/29/23 0524  05/30/23 0433 05/31/23 0601  06/01/23 0407  WBC 10.1 15.5* 8.7 7.9 3.8*  HGB 8.7* 8.6* 8.1* 7.7* 7.3*  HCT 25.5* 25.9* 24.4* 23.5* 21.9*  MCV 87.0 89.0 89.7 89.7 88.7  PLT 115* 128* 101* 86* 101*   Basic Metabolic Panel: Recent Labs  Lab 05/28/23 0451 05/28/23 1319 05/29/23 0524 05/29/23 1747 05/30/23 0433 05/31/23 0601 06/01/23 0407  NA 129*   < > 134* 133* 134* 137 143  K 2.6*   < > 3.3* 3.5 3.1* 3.6 3.2*  CL 100   < > 101 101 102 105 111  CO2 22   < > 22 22 24 22 23   GLUCOSE 392*   < > 178* 156* 153* 177* 178*  BUN 49*   < > 40* 37* 37* 41* 45*  CREATININE 0.97   < > 1.01* 1.02* 1.02* 1.19* 1.14*  CALCIUM 7.2*   < > 7.3* 7.4* 7.3* 7.6* 7.8*  MG 2.0  --  1.6*  --  2.0 1.9 1.9  PHOS 2.0*  --  2.9  --  3.4 3.8 4.2   < > = values in this interval not displayed.   GFR: Estimated Creatinine Clearance: 24.8 mL/min (A) (by C-G formula based on SCr of 1.14 mg/dL (H)). Liver Function Tests: Recent Labs  Lab 05/30/23 0433  AST 39  ALT 33  ALKPHOS 134*  BILITOT 1.0  PROT 5.3*  ALBUMIN 1.9*   No results for input(s): "LIPASE", "AMYLASE" in the last 168 hours. No results for input(s): "AMMONIA" in the last 168 hours. Coagulation Profile: No results for input(s): "INR", "PROTIME" in the last 168 hours. Cardiac Enzymes: No results for input(s): "CKTOTAL", "CKMB", "CKMBINDEX", "TROPONINI" in the last 168 hours. BNP (last 3 results) No results for input(s): "PROBNP" in the last 8760 hours. HbA1C: No results for input(s): "HGBA1C" in the last 72 hours. CBG: Recent Labs  Lab 05/31/23 1616 05/31/23 1649 05/31/23 2243 06/01/23 0818 06/01/23 1124  GLUCAP 38* 168* 166* 176* 159*   Lipid Profile: No results for input(s): "CHOL", "HDL", "LDLCALC", "TRIG", "CHOLHDL", "LDLDIRECT" in the last 72 hours. Thyroid Function Tests: No results for input(s): "TSH", "T4TOTAL", "FREET4", "T3FREE", "THYROIDAB" in the last 72 hours. Anemia Panel: No results for input(s): "VITAMINB12", "FOLATE", "FERRITIN", "TIBC",  "IRON", "RETICCTPCT" in the last 72 hours. Most Recent Urinalysis On File:     Component Value Date/Time   COLORURINE AMBER (A) 05/19/2023 2342   APPEARANCEUR CLOUDY (A) 05/19/2023 2342   APPEARANCEUR Clear 10/22/2014 2159   LABSPEC 1.017 05/19/2023 2342   LABSPEC 1.003 10/22/2014 2159   PHURINE 5.0 05/19/2023 2342   GLUCOSEU NEGATIVE 05/19/2023 2342   GLUCOSEU 150 mg/dL 78/29/5621 3086   HGBUR SMALL (A) 05/19/2023 2342   BILIRUBINUR NEGATIVE 05/19/2023 2342   BILIRUBINUR neg 04/29/2022 1158   BILIRUBINUR Negative 10/22/2014 2159   KETONESUR 5 (A) 05/19/2023 2342   PROTEINUR >=300 (A) 05/19/2023 2342   UROBILINOGEN negative (A) 04/29/2022 1158   NITRITE NEGATIVE 05/19/2023 2342   LEUKOCYTESUR MODERATE (A) 05/19/2023 2342   LEUKOCYTESUR Negative 10/22/2014 2159   Sepsis Labs: @LABRCNTIP (procalcitonin:4,lacticidven:4) Microbiology: Recent Results (from the past 240 hour(s))  Gastrointestinal Panel by PCR , Stool     Status: None   Collection Time: 05/23/23  5:15 PM   Specimen: Stool  Result Value Ref Range Status   Campylobacter species NOT DETECTED NOT DETECTED Final   Plesimonas shigelloides NOT DETECTED NOT DETECTED Final   Salmonella species NOT DETECTED NOT DETECTED Final   Yersinia enterocolitica NOT  DETECTED NOT DETECTED Final   Vibrio species NOT DETECTED NOT DETECTED Final   Vibrio cholerae NOT DETECTED NOT DETECTED Final   Enteroaggregative E coli (EAEC) NOT DETECTED NOT DETECTED Final   Enteropathogenic E coli (EPEC) NOT DETECTED NOT DETECTED Final   Enterotoxigenic E coli (ETEC) NOT DETECTED NOT DETECTED Final   Shiga like toxin producing E coli (STEC) NOT DETECTED NOT DETECTED Final   Shigella/Enteroinvasive E coli (EIEC) NOT DETECTED NOT DETECTED Final   Cryptosporidium NOT DETECTED NOT DETECTED Final   Cyclospora cayetanensis NOT DETECTED NOT DETECTED Final   Entamoeba histolytica NOT DETECTED NOT DETECTED Final   Giardia lamblia NOT DETECTED NOT DETECTED  Final   Adenovirus F40/41 NOT DETECTED NOT DETECTED Final   Astrovirus NOT DETECTED NOT DETECTED Final   Norovirus GI/GII NOT DETECTED NOT DETECTED Final   Rotavirus A NOT DETECTED NOT DETECTED Final   Sapovirus (I, II, IV, and V) NOT DETECTED NOT DETECTED Final    Comment: Performed at [facility], [address] [place], [place] [address]  SARS Coronavirus 2 by RT PCR (hospital order, performed in [facility] hospital lab) *cepheid single result test* Anterior Nasal Swab     Status: None   Collection Time: 05/26/23  9:00 AM   Specimen: Anterior Nasal Swab  Result Value Ref Range Status   SARS Coronavirus 2 by RT PCR NEGATIVE NEGATIVE Final    Comment: (NOTE) SARS-CoV-2 target nucleic acids are NOT DETECTED.  The SARS-CoV-2 RNA is generally detectable in upper and lower respiratory specimens during the acute phase of infection. The lowest concentration of SARS-CoV-2 viral copies this assay can detect is 250 copies / mL. A negative result does not preclude SARS-CoV-2 infection and should not be used as the sole basis for treatment or other patient management decisions.  A negative result may occur with improper specimen collection / handling, submission of specimen other than nasopharyngeal swab, presence of viral mutation(s) within the areas targeted by this assay, and inadequate number of viral copies (<250 copies / mL). A negative result must be combined with clinical observations, patient history, and epidemiological information.  Fact Sheet for Patients:   RoadLapTop.co.za  Fact Sheet for Healthcare Providers: http://kim-miller.com/  This test is not yet approved or  cleared by the [place] FDA and has been authorized for detection and/or diagnosis of SARS-CoV-2 by FDA under an Emergency Use Authorization (EUA).  This EUA will remain in effect (meaning this test can be used) for the duration of the COVID-19  declaration under Section 564(b)(1) of the Act, 21 U.S.C. section 360bbb-3(b)(1), unless the authorization is terminated or revoked sooner.  Performed at [facility], [address] [place], [place] [address]   Respiratory (~20 pathogens) panel by PCR     Status: None   Collection Time: 05/27/23  9:43 AM   Specimen: Nasopharyngeal Swab; Respiratory  Result Value Ref Range Status   Adenovirus NOT DETECTED NOT DETECTED Final   Coronavirus 229E NOT DETECTED NOT DETECTED Final    Comment: (NOTE) The Coronavirus on the Respiratory Panel, DOES NOT test for the novel  Coronavirus (2019 nCoV)    Coronavirus HKU1 NOT DETECTED NOT DETECTED Final   Coronavirus NL63 NOT DETECTED NOT DETECTED Final   Coronavirus OC43 NOT DETECTED NOT DETECTED Final   Metapneumovirus NOT DETECTED NOT DETECTED Final   Rhinovirus / Enterovirus NOT DETECTED NOT DETECTED Final   Influenza A NOT DETECTED NOT DETECTED Final   Influenza B NOT DETECTED NOT DETECTED Final  Parainfluenza Virus 1 NOT DETECTED NOT DETECTED Final   Parainfluenza Virus 2 NOT DETECTED NOT DETECTED Final   Parainfluenza Virus 3 NOT DETECTED NOT DETECTED Final   Parainfluenza Virus 4 NOT DETECTED NOT DETECTED Final   Respiratory Syncytial Virus NOT DETECTED NOT DETECTED Final   Bordetella pertussis NOT DETECTED NOT DETECTED Final   Bordetella Parapertussis NOT DETECTED NOT DETECTED Final   Chlamydophila pneumoniae NOT DETECTED NOT DETECTED Final   Mycoplasma pneumoniae NOT DETECTED NOT DETECTED Final    Comment: Performed at [facility], [address] [place], [place] [address]      Radiology Studies last 3 days: [facility] Chest Port 1 View  Result Date: 06/01/2023 CLINICAL DATA:  Respiratory failure, hypoxia and mycoplasma pneumonia. EXAM: PORTABLE CHEST 1 VIEW COMPARISON:  05/30/2023 FINDINGS: Stable heart size. Status post median sternotomy and aortic valve replacement. Stable positioning of right upper extremity PICC line  with tip in the SVC. Lungs again demonstrate diffuse primarily interstitial pattern lung disease bilaterally, right greater than left, with mid to lower zone predominance. No associated pneumothorax or visible pleural effusions. Findings may relate to atypical pneumonia and/or overlying additional component of interstitial edema. IMPRESSION: Persistent diffuse primarily interstitial pattern lung disease bilaterally, right greater than left, with mid to lower zone predominance. Findings may relate to atypical pneumonia and/or overlying additional component of interstitial edema. Electronically Signed   By: Irish Lack M.D.   On: 06/01/2023 11:38   ECHOCARDIOGRAM COMPLETE  Result Date: 05/30/2023    ECHOCARDIOGRAM REPORT   Patient Name:   Jenny [name] Date of Exam: 05/30/2023 Medical Rec #:  [id number]        Height:       56.0 in Accession #:    [id number]       Weight:       96.6 lb Date of Birth:  04-May-1946       BSA:          1.303 m Patient Age:    76 years         BP:           110/53 mmHg Patient Gender: F                HR:           89 bpm. Exam Location:  [facility] Procedure: 2D Echo, Cardiac Doppler and Color Doppler Indications:     Mitral valve insufficiency I34.0  History:         Patient has prior history of Echocardiogram examinations, most                  recent 05/20/2023. CAD; Risk Factors:Diabetes. Severe Aortic                  stenosis.  Sonographer:     Cristela Blue Referring Phys:  [id number] Inetta Fermo LAI Diagnosing Phys: Julien Nordmann MD IMPRESSIONS  1. Left ventricular ejection fraction, by estimation, is 60 to 65%. The left ventricle has normal function. The left ventricle has no regional wall motion abnormalities. There is mild left ventricular hypertrophy. Left ventricular diastolic parameters are consistent with Grade I diastolic dysfunction (impaired relaxation).  2. Right ventricular systolic function is normal. The right ventricular size is normal. There is mildly elevated pulmonary  artery systolic pressure. The estimated right ventricular systolic pressure is 36.8 mmHg.  3. The mitral valve is normal in structure. Mild to moderate mitral valve regurgitation. Mild to moderate mitral stenosis. The mean  mitral valve gradient is 9.0 mmHg.  4. Tricuspid valve regurgitation is moderate.  5. The aortic valve has been repaired/replaced. Aortic valve regurgitation is not visualized. No aortic stenosis is present. Aortic valve mean gradient measures 13.7 mmHg. Aortic valve Vmax measures 2.45 m/s.  6. The inferior vena cava is normal in size with greater than 50% respiratory variability, suggesting right atrial pressure of 3 mmHg. FINDINGS  Left Ventricle: Left ventricular ejection fraction, by estimation, is 60 to 65%. The left ventricle has normal function. The left ventricle has no regional wall motion abnormalities. The left ventricular internal cavity size was normal in size. There is  mild left ventricular hypertrophy. Left ventricular diastolic parameters are consistent with Grade I diastolic dysfunction (impaired relaxation). Right Ventricle: The right ventricular size is normal. No increase in right ventricular wall thickness. Right ventricular systolic function is normal. There is mildly elevated pulmonary artery systolic pressure. The tricuspid regurgitant velocity is 2.82  m/s, and with an assumed right atrial pressure of 5 mmHg, the estimated right ventricular systolic pressure is 36.8 mmHg. Left Atrium: Left atrial size was normal in size. Right Atrium: Right atrial size was normal in size. Pericardium: There is no evidence of pericardial effusion. Mitral Valve: The mitral valve is normal in structure. There is severe calcification of the mitral valve leaflet(s). Mild to moderate mitral valve regurgitation. Mild to moderate mitral valve stenosis. MV peak gradient, 14.7 mmHg. The mean mitral valve gradient is 9.0 mmHg. Tricuspid Valve: The tricuspid valve is normal in structure. Tricuspid  valve regurgitation is moderate . No evidence of tricuspid stenosis. Aortic Valve: The aortic valve has been repaired/replaced. Aortic valve regurgitation is not visualized. No aortic stenosis is present. Aortic valve mean gradient measures 13.7 mmHg. Aortic valve peak gradient measures 23.9 mmHg. Aortic valve area, by VTI measures 1.12 cm. There is a bioprosthetic valve present in the aortic position. Pulmonic Valve: The pulmonic valve was normal in structure. Pulmonic valve regurgitation is mild. No evidence of pulmonic stenosis. Aorta: The aortic root is normal in size and structure. Venous: The inferior vena cava is normal in size with greater than 50% respiratory variability, suggesting right atrial pressure of 3 mmHg. IAS/Shunts: No atrial level shunt detected by color flow Doppler.  LEFT VENTRICLE PLAX 2D LVIDd:         3.70 cm   Diastology LVIDs:         2.30 cm   LV e' medial:    6.42 cm/s LV PW:         1.10 cm   LV E/e' medial:  22.9 LV IVS:        1.30 cm   LV e' lateral:   6.64 cm/s LVOT diam:     1.90 cm   LV E/e' lateral: 22.1 LV SV:         52 LV SV Index:   40 LVOT Area:     2.84 cm  RIGHT VENTRICLE RV Basal diam:  3.10 cm RV Mid diam:    2.40 cm RV S prime:     14.00 cm/s TAPSE (M-mode): 2.0 cm LEFT ATRIUM             Index        RIGHT ATRIUM           Index LA diam:        3.90 cm 2.99 cm/m   RA Area:     10.70 cm LA Vol (A2C):   34.9 ml 26.79 ml/m  RA Volume:   21.40 ml  16.43 ml/m LA Vol (A4C):   16.8 ml 12.90 ml/m LA Biplane Vol: 25.9 ml 19.88 ml/m  AORTIC VALVE AV Area (Vmax):    0.98 cm AV Area (Vmean):   1.05 cm AV Area (VTI):     1.12 cm AV Vmax:           244.67 cm/s AV Vmean:          170.667 cm/s AV VTI:            0.466 m AV Peak Grad:      23.9 mmHg AV Mean Grad:      13.7 mmHg LVOT Vmax:         84.60 cm/s LVOT Vmean:        63.000 cm/s LVOT VTI:          0.184 m LVOT/AV VTI ratio: 0.40 MITRAL VALVE                TRICUSPID VALVE MV Area (PHT): 2.31 cm     TR Peak grad:    31.8 mmHg MV Area VTI:   1.07 cm     TR Vmax:        282.00 cm/s MV Peak grad:  14.7 mmHg MV Mean grad:  9.0 mmHg     SHUNTS MV Vmax:       1.92 m/s     Systemic VTI:  0.18 m MV Vmean:      141.0 cm/s   Systemic Diam: 1.90 cm MV Decel Time: 328 msec MV E velocity: 147.00 cm/s MV A velocity: 183.00 cm/s MV E/A ratio:  0.80 Julien Nordmann MD Electronically signed by Julien Nordmann MD Signature Date/Time: 05/30/2023/1:33:03 PM    Final    DG Chest Port 1 View  Result Date: 05/30/2023 CLINICAL DATA:  Hypoxia. EXAM: PORTABLE CHEST 1 VIEW COMPARISON:  05/28/2023. FINDINGS: There are diffuse increased interstitial markings throughout bilateral lungs, more pronounced than the prior exam, suggesting superimposed congestive heart failure/pulmonary edema. No dense consolidation or lung collapse. There are probable small layering pleural effusions. No pneumothorax. Stable cardio-mediastinal silhouette. Sternotomy wires noted.  There is prosthetic aortic valve. No acute osseous abnormalities. The soft tissues are within normal limits. Right-sided PICC line noted with its tip overlying the midportion of superior vena cava IMPRESSION: *Increased interstitial markings and probable layering bilateral pleural effusions, favor congestive heart failure/pulmonary edema, slightly worsened since the prior study. Electronically Signed   By: Jules Schick M.D.   On: 05/30/2023 08:20   DG Chest Port 1 View  Result Date: 05/28/2023 CLINICAL DATA:  Sepsis PICC placement EXAM: PORTABLE CHEST 1 VIEW COMPARISON:  05/27/2023, 12/25/2021 FINDINGS: Post sternotomy changes and valve prosthesis. Right upper extremity central venous catheter tip over the SVC. Stable cardiomediastinal silhouette with aortic atherosclerosis.underlying chronic interstitial opacities but with superimposed heterogeneous interstitial and ground-glass opacity. No sizeable effusions. IMPRESSION: 1. Right upper extremity central venous catheter tip over the SVC. 2.  Underlying chronic interstitial opacities with superimposed heterogeneous interstitial and ground-glass opacity which may be due to edema or infection. Electronically Signed   By: Jasmine Pang M.D.   On: 05/28/2023 19:32       Time spent: 50 min    Sunnie Nielsen, DO [facility] 06/01/2023, 3:39 PM    Dictation software may have been used to generate the above note. Typos may occur and escape review in typed/dictated notes. Please contact Dr Lyn Hollingshead directly for clarity if needed.  Staff may message  me via secure chat in Epic  but this may not receive an immediate response,  please page me for urgent matters!  If 7PM-7AM, please contact night coverage [email or web address]

## 2023-06-01 NOTE — Progress Notes (Signed)
Daily Progress Note   Patient Name: Jenny [name]       Date: 06/01/2023 DOB: December 17, 1945  Age: 77 y.o. MRN#: [id number] Attending Physician: Sunnie Nielsen, DO Primary Care Physician: Corky Downs, MD Admit Date: 05/19/2023  Reason for Consultation/Follow-up: Establishing goals of care  HPI/Brief Hospital Review: 77 y.o. female  with past medical history of CAD, HLD, stroke, severe aortic stenosis with aortic valve replacement (2018), TIA, type 2 diabetes, former smoker, and IM tibial nail placement (2019) admitted on 05/19/2023 with nausea, vomiting, and weakness.   Chest x-ray with multifocal opacity. Managed for community-acquired pneumonia and was able to be weaned down to 1 L nasal cannula. However overnight on 11/25 her oxygen requirement started going up chest x-ray with worsening bilateral opacities. Thought to be secondary to pulmonary edema and therefore was started on diuresis. Despite adequate diuresis her oxygen has not been improving. Currently on high flow and CPAP at 100% FiO2.    Echocardiogram on admission with normal LVEF, bioprosthetic aortic valve in place with a gradient of 15 mmHg. However there was moderate mitral valve regurgitation and moderate pulmonary valve regurgitation.  Cardiology following.   PMT was consulted to discuss goals of care.   Subjective: Extensive chart review has been completed prior to meeting patient including labs, vital signs, imaging, progress notes, orders, and available advanced directive documents from current and previous encounters.    Visited with Jenny [name] at her bedside. Resting in bed with bipap in place, due to worsening anxiety overnight she was placed on Precedex infusion. Saturations fluctuating between mid 80's to low  90's on 100% Fi02.  Husband requested conversation in hallway. We again addressed concern for current situation, requiring high levels of oxygen support and not maintaining without bipap support. He becomes tearful and shares he is struggling to come to a place of acceptance. Emotional support provided. He shares various family members will be visiting today.  Answered and addressed all questions and concerns. PMT to continue to follow for ongoing needs and support.  Care plan was discussed with nursing staff, primary team and CCM team.  Thank you for allowing the Palliative Medicine Team to assist in the care of this patient.  Total time:  35 minutes  Time spent includes: Detailed review of medical records (labs, imaging, vital signs), medically appropriate  exam (mental status, respiratory, cardiac, skin), discussed with treatment team, counseling and educating patient, family and staff, documenting clinical information, medication management and coordination of care.  Leeanne Deed, DNP, AGNP-C Palliative Medicine   Please contact Palliative Medicine Team phone at [phone] for questions and concerns.

## 2023-06-01 NOTE — Hospital Course (Addendum)
HPI: 77 yo female who presented to [facility] ER on 11/18 via EMS with c/o "not feeling well", nausea, and vomiting. EMS reported when they arrived at pts home she was lethargic and diaphoretic with generalized weakness. She received iv fluids en route to the ER. Pts husband reports the pt has had a cough over the past 2 weeks, however her cough worsened and she vomited bile prompting EMS notification.    Hospital course / significant events:  11/18: To ED w/ hypotensive shock and respiratory failure. Admitted to ICU for septic shock d/t pneumonia requiring levophed gtt.  11/19: cardiology consult, chest pain / elevated troponin NSTEMI d/t sepsis unlikely significant underlying ischemia but consider outpatient w/u  11/20: off pressors  11/21: transferred to hospitalist service 11/24: O2 requirement back up, needing high flow, CXR showing pulmonary edema, diuresis was increased  11/25: Pt desat on 15L switched to heated hf, transferred to stepdown. Surprisingly, pt reported no shortness of breath and was not in distress, however, was intermittently somnolent. CTA Chest negative for PE. 11/26:  Persistent high FiO2 requirements. Concern for recurrent aspiration, CXR with worsening aeration concerning for possible evolving multilobar bilateral bronchopneumonia.  Started empiric Doxycycline and Zoysn. High risk for intubation. Added steroids. PCCM consulted. 11/27: resp status worsening, requiring CPAP. See IPAL note, DNR code status / goals. Mycoplasma IgM positive  11/29: continued confusion/agitation, requiring precedex gtt. Repeat echocardiogram with normal LVEF - mild to moderate mitral stenosis and regurgitation and mild elvation in PASP.  11/30: Pulse dose steroids. Started another round azithromycin for mycoplasma  12/01: d/w family - best case scenario we give her a few days and her oxygenation recovers and can start eating, worst case she does not recover or worsens in which case we can assume her body  is not going to respond to treatment. Consider NG tube if not worse over the next few days, holding for now d/t mental status / agitation she is not likely to tolerate it   Consultants:  ICU Cardiology  Neurology Palliative Care   Procedures/Surgeries: none      ASSESSMENT & PLAN:   Acute respiratory failure with hypoxia 2/2 Mycoplasma PNA Mycoplasam IgM(+) --> Per PCCM, this is likely a recent exposure and what we are dealing with now is organizing pneumonia. Started another course of azithromycin 11/30 increase steroid to IV solumedrol 1000 mg for 3 days, per PCCM rec.    Dysphagia pt is at increased aspiration risk and given tenuous respiratory status SLP rec NPO on 11/27, however, pt insists on having oral intake, so diet ordered as dys 1 with nectar thick.   Pt and family have been advised of the risks of aspiration. Would avoid placing NG for now given agitation    Diplopia New on 11/27, right pupil constricted and fixed. After discussion w/ Dr. Otelia Limes of neurology, ordered CT of head and CTA head/neck which had no acute finding. Monitor for changes  Resolved Septic shock- PRESENT ON ADMISSION - DUE TO PNEUMONIA See hospital course   Resolved NSTEMI trop peaked at 848.  In the setting of respiratory distress, pneumonia, sepsis. cardio consulted Normal ejection fraction on echo  Consider ischemic workup following recovery, could be arranged as outpatient    Aortic valve stenosis status post bioprosthetic aortic valve  Follow outpatient    Chronic systolic CHF, exacerbation has been ruled out GDMT as BP/renal function tolerates    Hx: CAD, HLD, Stroke cont ASA resume statin after discharge   Mild acute kidney injury  secondary ATN  monitor Cr while diuresing   Metabolic acidosis  Lactic acidosis  Resolved Repeat las as needed   Hypokalemia  monitor and supplement PRN   Hypophos monitor and supplement PRN   Transaminitis trended down Monitor  periodic CMP/hepatic panel    Hx of Iron deficiency anemia  cute on chronic thrombocytopenia  Trend CBC  Monitor for s/sx of bleeding  Transfuse for hgb <7   Type II diabetes mellitus  Hyperglycemia due to steroid Hypoglycemia due to poor oral intake Hemoglobin A1c only 5.3  Holding insulin while NPO  Glc q4h   IBS with diarrhea pt reported at baseline, she takes Imodium for her diarrhea, minimum 2 mg x5 tabs per day. multiple episodes of diarrhea noted, C diff and GI path neg.   Rectal tube inserted on 11/21.   Received scheduled Imodium and Lomotil.      Normal weight based on BMI: Body mass index is 21.4 kg/m.  Underweight - under 18.5  normal weight - 18.5 to 24.9 overweight - 25 to 29.9 obese - 30 or more   DVT prophylaxis: heparin IV fluids: no continuous IV fluids - diuresing per cardiology  Nutrition: dyspagia, recs for NPO d/t aspiration risk, see above  Central lines / invasive devices: rectal tube  Code Status: DNR ACP documentation reviewed: 06/01/23 and none on file in [facility]  TOC needs: Anticiapte SNF rehab if recovering  Barriers to dispo / significant pending items: respiratory status

## 2023-06-01 NOTE — Progress Notes (Signed)
Patient Name: Jenny [name] Date of Encounter: 06/01/2023 [facility] Cardiologist: Lorine Bears, MD   Interval Summary  .    Can't get comfortable.  Wonders if she is making any improvement.   Vital Signs .    Vitals:   06/01/23 0630 06/01/23 0700 06/01/23 0728 06/01/23 0730  BP: (!) 119/46 (!) 119/47 (!) 124/53 (!) 124/53  Pulse: 77 76 78 80  Resp: (!) 25 (!) 24 (!) 24 (!) 23  Temp:      TempSrc:      SpO2: 90% (!) 89% 90% 90%  Weight:      Height:        Intake/Output Summary (Last 24 hours) at 06/01/2023 1027 Last data filed at 06/01/2023 0913 Gross per 24 hour  Intake 405.85 ml  Output 1355 ml  Net -949.15 ml      06/01/2023    5:00 AM 05/30/2023    6:00 AM 05/29/2023    5:00 AM  Last 3 Weights  Weight (lbs) 95 lb 7.4 oz 96 lb 9 oz 97 lb 10.6 oz  Weight (kg) 43.3 kg 43.8 kg 44.3 kg      Telemetry/ECG    Sinus rhythm.  No events.  - Personally Reviewed  Echo 05/31/23:   1. Left ventricular ejection fraction, by estimation, is 60 to 65%. The  left ventricle has normal function. The left ventricle has no regional  wall motion abnormalities. There is mild left ventricular hypertrophy.  Left ventricular diastolic parameters  are consistent with Grade I diastolic dysfunction (impaired relaxation).   2. Right ventricular systolic function is normal. The right ventricular  size is normal. There is mildly elevated pulmonary artery systolic  pressure. The estimated right ventricular systolic pressure is 36.8 mmHg.   3. The mitral valve is normal in structure. Mild to moderate mitral valve  regurgitation. Mild to moderate mitral stenosis. The mean mitral valve  gradient is 9.0 mmHg.   4. Tricuspid valve regurgitation is moderate.   5. The aortic valve has been repaired/replaced. Aortic valve  regurgitation is not visualized. No aortic stenosis is present. Aortic  valve mean gradient measures 13.7 mmHg. Aortic valve Vmax measures 2.45  m/s.   6.  The inferior vena cava is normal in size with greater than 50%  respiratory variability, suggesting right atrial pressure of 3 mmHg.   Physical Exam .    VS:  BP (!) 124/53 (BP Location: Left Arm)   Pulse 80   Temp 98.1 F (36.7 C) (Axillary)   Resp (!) 23   Ht 4\' 8"  (1.422 m)   Wt 43.3 kg   SpO2 90%   BMI 21.40 kg/m  , BMI Body mass index is 21.4 kg/m. GENERAL:  Frail.  Ill-appearing.  On BiPAP HEENT: Pupils equal round and reactive, fundi not visualized, oral mucosa unremarkable NECK:  + JVP 2 cm above clavicle at 45 degrees .  waveform within normal limits, carotid upstroke brisk and symmetric, no bruits, no thyromegaly LUNGS:  Coarse breath sounds anteriorly. HEART:  RRR.  PMI not displaced or sustained,S1 and S2 within normal limits, no S3, no S4, no clicks, no rubs, no murmurs ABD:  Flat, positive bowel sounds normal in frequency in pitch, no bruits, no rebound, no guarding, no midline pulsatile mass, no hepatomegaly, no splenomegaly EXT:  2 plus pulses throughout, no edema, no cyanosis no clubbing SKIN:  No rashes no nodules NEURO:  Cranial nerves II through XII grossly intact, motor grossly intact throughout  PSYCH:  Cognitively intact, oriented to person place and time   Assessment & Plan .     63F with HFpEF, s/p bioprosthetic AVR,  hypertension, diabetes, CVA admitted with septic shock 2/2 pneumonia and demand ischemia vs NSTEMI.  # Hypoxic respiratory failure:  # Mycoplasma pneumonia: # HFpEF:  Likely a mixed process.  She appears to be euvolemic to dry on exam and RA pressure 3 mmHg on echo.  CXR reports pulmonary edema though this is likely also due to Mycoplasma pneumonia.  Receiving steroids and Azithromycin.  However, BNP this AM increasing to 2100.  Yesterday she was net - and renal function is stable after receiving one dose of IV lasix.  Will give another 40mg  IV now.  # Elevated troponin:  HS-troponin peaked at 848.  Likely demand ischemia.  LVEF this  admission 60-65%. She has no chest pain.  Consider outpatient ischemic evaluation once medically stable.   # s/p AVR:  Valve stable on echo this admission.    # CVA: # Hyperlipidemia:  Continue aspirin and statin.  Total critical care time: 30 minutes. Critical care time was exclusive of separately billable procedures and treating other patients. Critical care was necessary to treat or prevent imminent or life-threatening deterioration. Critical care was time spent personally by me on the following activities: development of treatment plan with patient and/or surrogate as well as nursing, discussions with consultants, evaluation of patient's response to treatment, examination of patient, obtaining history from patient or surrogate, ordering and performing treatments and interventions, ordering and review of laboratory studies, ordering and review of radiographic studies, pulse oximetry and re-evaluation of patient's condition.   For questions or updates, please contact [facility] Please consult [email or web address] for contact info under        Signed, Chilton Si, MD

## 2023-06-01 DEATH — deceased

## 2023-06-02 DIAGNOSIS — I214 Non-ST elevation (NSTEMI) myocardial infarction: Secondary | ICD-10-CM | POA: Diagnosis not present

## 2023-06-02 DIAGNOSIS — N179 Acute kidney failure, unspecified: Secondary | ICD-10-CM | POA: Diagnosis not present

## 2023-06-02 DIAGNOSIS — J9601 Acute respiratory failure with hypoxia: Secondary | ICD-10-CM | POA: Diagnosis not present

## 2023-06-02 DIAGNOSIS — A419 Sepsis, unspecified organism: Secondary | ICD-10-CM | POA: Diagnosis not present

## 2023-06-02 DIAGNOSIS — R6521 Severe sepsis with septic shock: Secondary | ICD-10-CM | POA: Diagnosis not present

## 2023-06-02 LAB — TROPONIN I (HIGH SENSITIVITY): Troponin I (High Sensitivity): 27 ng/L — ABNORMAL HIGH (ref <18)

## 2023-06-02 LAB — CBC
HCT: 23.7 % — ABNORMAL LOW (ref 36.0–46.0)
Hemoglobin: 7.7 g/dL — ABNORMAL LOW (ref 12.0–15.0)
MCH: 28.7 pg (ref 26.0–34.0)
MCHC: 32.5 g/dL (ref 30.0–36.0)
MCV: 88.4 fL (ref 80.0–100.0)
Platelets: 84 10*3/uL — ABNORMAL LOW (ref 150–400)
RBC: 2.68 MIL/uL — ABNORMAL LOW (ref 3.87–5.11)
RDW: 16.3 % — ABNORMAL HIGH (ref 11.5–15.5)
WBC: 3.5 10*3/uL — ABNORMAL LOW (ref 4.0–10.5)
nRBC: 0 % (ref 0.0–0.2)

## 2023-06-02 LAB — CYCLIC CITRUL PEPTIDE ANTIBODY, IGG/IGA: CCP Antibodies IgG/IgA: 8 U (ref 0–19)

## 2023-06-02 LAB — BASIC METABOLIC PANEL
Anion gap: 7 (ref 5–15)
BUN: 51 mg/dL — ABNORMAL HIGH (ref 8–23)
CO2: 25 mmol/L (ref 22–32)
Calcium: 7.8 mg/dL — ABNORMAL LOW (ref 8.9–10.3)
Chloride: 116 mmol/L — ABNORMAL HIGH (ref 98–111)
Creatinine, Ser: 0.99 mg/dL (ref 0.44–1.00)
GFR, Estimated: 60 mL/min — ABNORMAL LOW (ref >60)
Glucose, Bld: 94 mg/dL (ref 70–99)
Potassium: 2.7 mmol/L — CL (ref 3.5–5.1)
Sodium: 148 mmol/L — ABNORMAL HIGH (ref 135–145)

## 2023-06-02 LAB — GLUCOSE, CAPILLARY
Glucose-Capillary: 145 mg/dL — ABNORMAL HIGH (ref 70–99)
Glucose-Capillary: 55 mg/dL — ABNORMAL LOW (ref 70–99)

## 2023-06-02 LAB — PHOSPHORUS: Phosphorus: 4.2 mg/dL (ref 2.5–4.6)

## 2023-06-02 LAB — MAGNESIUM: Magnesium: 2 mg/dL (ref 1.7–2.4)

## 2023-06-02 LAB — ANA W/REFLEX IF POSITIVE: Anti Nuclear Antibody (ANA): NEGATIVE

## 2023-06-02 MED ORDER — GLYCOPYRROLATE 1 MG PO TABS: 1.0000 mg | ORAL_TABLET | ORAL | Status: DC | PRN

## 2023-06-02 MED ORDER — POLYVINYL ALCOHOL 1.4 % OP SOLN: 1.0000 [drp] | Freq: Four times a day (QID) | OPHTHALMIC | Status: DC | PRN

## 2023-06-02 MED ORDER — DEXTROSE 50 % IV SOLN
25.0000 mL | Freq: Once | INTRAVENOUS | Status: AC
  Administered 2023-06-02: 25 mL via INTRAVENOUS
  Filled 2023-06-02: qty 50

## 2023-06-02 MED ORDER — MORPHINE 100MG IN NS 100ML (1MG/ML) PREMIX INFUSION
0.0000 mg/h | INTRAVENOUS | Status: DC
  Administered 2023-06-02: 5 mg/h via INTRAVENOUS
  Filled 2023-06-02: qty 100

## 2023-06-02 MED ORDER — ACETAMINOPHEN 325 MG PO TABS: 650.0000 mg | ORAL_TABLET | Freq: Four times a day (QID) | ORAL | Status: DC | PRN

## 2023-06-02 MED ORDER — MORPHINE SULFATE (PF) 2 MG/ML IV SOLN
2.0000 mg | Freq: Once | INTRAVENOUS | Status: AC
  Administered 2023-06-02: 2 mg via INTRAVENOUS
  Filled 2023-06-02: qty 1

## 2023-06-02 MED ORDER — POTASSIUM CHLORIDE 10 MEQ/100ML IV SOLN
10.0000 meq | INTRAVENOUS | Status: DC
  Administered 2023-06-02 (×3): 10 meq via INTRAVENOUS
  Filled 2023-06-02 (×5): qty 100

## 2023-06-02 MED ORDER — MORPHINE BOLUS VIA INFUSION
5.0000 mg | INTRAVENOUS | Status: DC | PRN
Start: 2023-06-02 — End: 2023-06-02

## 2023-06-02 MED ORDER — ACETAMINOPHEN 650 MG RE SUPP: 650.0000 mg | Freq: Four times a day (QID) | RECTAL | Status: DC | PRN

## 2023-06-02 MED ORDER — MIDAZOLAM HCL 2 MG/2ML IJ SOLN: 2.0000 mg | INTRAMUSCULAR | Status: DC | PRN

## 2023-06-02 MED ORDER — GLYCOPYRROLATE 0.2 MG/ML IJ SOLN: 0.2000 mg | INTRAMUSCULAR | Status: DC | PRN

## 2023-06-02 MED ORDER — MORPHINE SULFATE (PF) 2 MG/ML IV SOLN
2.0000 mg | Freq: Once | INTRAVENOUS | Status: AC
Start: 2023-06-02 — End: 2023-06-02
  Administered 2023-06-02: 2 mg via INTRAVENOUS
  Filled 2023-06-02: qty 1

## 2023-06-11 ENCOUNTER — Ambulatory Visit: Payer: Medicare Other | Admitting: Family Medicine

## 2023-07-02 NOTE — Progress Notes (Signed)
   06/01/2023 1000  Spiritual Encounters  Type of Visit Follow up  Care provided to: Pt and family  Conversation partners present during encounter Nurse  Referral source Nurse (RN/NT/LPN);Family  Reason for visit End-of-life  OnCall Visit No  Spiritual Framework  Presenting Themes Meaning/purpose/sources of inspiration;Rituals and practive  Patient Stress Factors Loss   Chaplain received a call for EOL. Chaplain went and sat with the patients husband and brother in law. Chaplain prayed with the family and stayed with them while the patient transitioned. Chaplain helped family fill out paper work for [employer]. Chaplain services remain available for spiritual and emotional support.

## 2023-07-02 NOTE — Progress Notes (Signed)
I agree with DNR and comfort measures.  We will sign off.

## 2023-07-02 NOTE — Plan of Care (Signed)
Patient continues to require CPAP at 100%, unable to take POs currently. Foley catheter draining yellow cloudy urine. Rectal tube was found in the bed with balloon inflated. Patient had 1 incontinent BM and was able to tell me that the rectal tube was out and that she had a BM.  Problem: Education: Goal: Ability to describe self-care measures that may prevent or decrease complications (Diabetes Survival Skills Education) will improve Outcome: Not Progressing   Problem: Coping: Goal: Ability to adjust to condition or change in health will improve Outcome: Not Progressing   Problem: Fluid Volume: Goal: Ability to maintain a balanced intake and output will improve Outcome: Not Progressing   Problem: Health Behavior/Discharge Planning: Goal: Ability to manage health-related needs will improve Outcome: Not Progressing   Problem: Metabolic: Goal: Ability to maintain appropriate glucose levels will improve Outcome: Progressing   Problem: Nutritional: Goal: Maintenance of adequate nutrition will improve Outcome: Not Progressing Goal: Progress toward achieving an optimal weight will improve Outcome: Not Progressing   Problem: Skin Integrity: Goal: Risk for impaired skin integrity will decrease Outcome: Not Progressing

## 2023-07-02 NOTE — Progress Notes (Signed)
Agree with comfort measures, appreciate PCCU team's help this morning. I looked in on patient, husband is present, chaplain is at bedside, no further questions at this time from family

## 2023-07-02 NOTE — Progress Notes (Signed)
Time of death 10:15 a.m.Husband at bedside. Dr. Lyn Hollingshead notified.

## 2023-07-02 NOTE — Progress Notes (Signed)
Pt very restless, constantly taking cpap mask off. NP Annabelle Harman at bedside. RT to bedside to put her on HHFNC & non-rebreather. O2 sats 78-86%. Received verbal order from [facility] NP to increase precedex to 1.4.

## 2023-07-02 NOTE — Death Summary Note (Signed)
DEATH SUMMARY   Patient Details  Name: Jenny [name] MRN: [id number] DOB: 10/20/1945 EXB:MWUXLK, Renda Rolls, MD Admission/Discharge Information   Admit Date:  25-May-2023  Date of Death: Date of Death: 06/08/23  Time of Death: Time of Death: 1015  Length of Stay: 2023/06/19   Principle Cause of death: Mycoplasma pneumonia  Hospital Diagnoses: Principal Problem:   Sepsis with acute organ dysfunction and septic shock [facility]) Active Problems:   Aortic valve stenosis   Non-STEMI (non-ST elevated myocardial infarction) (HCC)   Mitral valve stenosis   Cirrhosis of liver with ascites (HCC)   Protein-calorie malnutrition, severe   Acute respiratory failure with hypoxia (HCC)   Acute hypoxic respiratory failure (HCC)   Acute on chronic respiratory failure with hypoxia (HCC)   Community acquired pneumonia   Hospital Course:  HPI: 78 yo female who presented to [facility] ER on May 25, 2023 via EMS with c/o "not feeling well", nausea, and vomiting. EMS reported when they arrived at pts home she was lethargic and diaphoretic with generalized weakness. She received iv fluids en route to the ER. Pts husband reports the pt has had a cough over the past 2 weeks, however her cough worsened and she vomited bile prompting EMS notification.    Hospital course / significant events:  05-25-2023: To ED w/ hypotensive shock and respiratory failure. Admitted to ICU for septic shock d/t pneumonia requiring levophed gtt.  11/19: cardiology consult, chest pain / elevated troponin NSTEMI d/t sepsis unlikely significant underlying ischemia but consider outpatient w/u  11/20: off pressors  11/21: transferred to hospitalist service 11/24: O2 requirement back up, needing high flow, CXR showing pulmonary edema, diuresis was increased  11/25: Pt desat on 15L switched to heated hf, transferred to stepdown. Surprisingly, pt reported no shortness of breath and was not in distress, however, was intermittently somnolent. CTA Chest negative  for PE. 11/26:  Persistent high FiO2 requirements. Concern for recurrent aspiration, CXR with worsening aeration concerning for possible evolving multilobar bilateral bronchopneumonia.  Started empiric Doxycycline and Zoysn. High risk for intubation. Added steroids. PCCM consulted. 11/27: resp status worsening, requiring CPAP. See IPAL note, DNR code status / goals. Mycoplasma IgM positive  11/29: continued confusion/agitation, requiring precedex gtt. Repeat echocardiogram with normal LVEF - mild to moderate mitral stenosis and regurgitation and mild elvation in PASP.  11/30: Pulse dose steroids. Started another round azithromycin for mycoplasma  12/01: d/w family - best case scenario we give her a few days and her oxygenation recovers and can start eating, worst case she does not recover or worsens in which case we can assume her body is not going to respond to treatment. Consider NG tube if not worse over the next few days, holding for now d/t mental status / agitation she is not likely to tolerate it  June 08, 2023: overnight pulling off mask despite precedex, increased respiratory distres and WOB. PCCU eval early am and spok ew/ family, decision made for comfort measures and patient passed soon after that. Time of death 10:15.   Consultants:  ICU Cardiology  Neurology Palliative Care   Procedures/Surgeries: none      ASSESSMENT & PLAN:   Acute respiratory failure with hypoxia 2/2 Mycoplasma PNA Mycoplasam IgM(+) --> Per PCCM, this is likely a recent exposure and what we are dealing with now is organizing pneumonia. Primary cause of death     Resolved Septic shock- PRESENT ON ADMISSION - DUE TO PNEUMONIA     Cardiac disease Resolved NSTEMI Aortic valve stenosis status post bioprosthetic  aortic valve  Chronic systolic CHF, exacerbation  Hx: CAD, HLD, Stroke See hospital course  Conferred poor prognosis  Poor respiratory status despite diuresis, CHF was not primary cause of death but  pulmonary edema may have contributed to respiratory difficulty     Metabolic derangements associated w/ acute illness Mild acute kidney injury secondary ATN  Metabolic acidosis  Lactic acidosis  Hypokalemia Hypophosphatemia Transaminitis Unlikely to have contributed directly to death but confers poor prognosis   Hematology Hx of Iron deficiency anemia  Acute on chronic thrombocytopenia  Unlikely to have contributed directly to death but confers poor prognosis   Encodrine Type II diabetes mellitus  Hyperglycemia due to steroid Hypoglycemia due to poor oral intake Unlikely to have contributed directly to death but confers poor prognosis   Gastro IBS with diarrhea Dysphagia pt is at increased aspiration risk and given tenuous respiratory status Nutrition status conferred poor prognosis, no safe way to feed patient, there was discussion of possible feeding tube at some point but pt passed  Unlikely to have contributed directly to death but confers poor prognosis    Diplopia New on 11/27, right pupil constricted and fixed. After discussion w/ Dr. Otelia Limes of neurology, ordered CT of head and CTA head/neck which had no acute finding. Did not contribute to death              The results of significant diagnostics from this hospitalization (including imaging, microbiology, ancillary and laboratory) are listed below for reference.   Significant Diagnostic Studies: DG Chest Port 1 View  Result Date: 06/01/2023 CLINICAL DATA:  Respiratory failure, hypoxia and mycoplasma pneumonia. EXAM: PORTABLE CHEST 1 VIEW COMPARISON:  05/30/2023 FINDINGS: Stable heart size. Status post median sternotomy and aortic valve replacement. Stable positioning of right upper extremity PICC line with tip in the SVC. Lungs again demonstrate diffuse primarily interstitial pattern lung disease bilaterally, right greater than left, with mid to lower zone predominance. No associated pneumothorax or visible  pleural effusions. Findings may relate to atypical pneumonia and/or overlying additional component of interstitial edema. IMPRESSION: Persistent diffuse primarily interstitial pattern lung disease bilaterally, right greater than left, with mid to lower zone predominance. Findings may relate to atypical pneumonia and/or overlying additional component of interstitial edema. Electronically Signed   By: Irish Lack M.D.   On: 06/01/2023 11:38   ECHOCARDIOGRAM COMPLETE  Result Date: 05/30/2023    ECHOCARDIOGRAM REPORT   Patient Name:   JAMIL RESTREPO Date of Exam: 05/30/2023 Medical Rec #:  [id number]        Height:       56.0 in Accession #:    [id number]       Weight:       96.6 lb Date of Birth:  01/15/1946       BSA:          1.303 m Patient Age:    76 years         BP:           110/53 mmHg Patient Gender: F                HR:           89 bpm. Exam Location:  [facility] Procedure: 2D Echo, Cardiac Doppler and Color Doppler Indications:     Mitral valve insufficiency I34.0  History:         Patient has prior history of Echocardiogram examinations, most  recent 05/20/2023. CAD; Risk Factors:Diabetes. Severe Aortic                  stenosis.  Sonographer:     Cristela Blue Referring Phys:  [id number] Inetta Fermo LAI Diagnosing Phys: Julien Nordmann MD IMPRESSIONS  1. Left ventricular ejection fraction, by estimation, is 60 to 65%. The left ventricle has normal function. The left ventricle has no regional wall motion abnormalities. There is mild left ventricular hypertrophy. Left ventricular diastolic parameters are consistent with Grade I diastolic dysfunction (impaired relaxation).  2. Right ventricular systolic function is normal. The right ventricular size is normal. There is mildly elevated pulmonary artery systolic pressure. The estimated right ventricular systolic pressure is 36.8 mmHg.  3. The mitral valve is normal in structure. Mild to moderate mitral valve regurgitation. Mild to moderate mitral  stenosis. The mean mitral valve gradient is 9.0 mmHg.  4. Tricuspid valve regurgitation is moderate.  5. The aortic valve has been repaired/replaced. Aortic valve regurgitation is not visualized. No aortic stenosis is present. Aortic valve mean gradient measures 13.7 mmHg. Aortic valve Vmax measures 2.45 m/s.  6. The inferior vena cava is normal in size with greater than 50% respiratory variability, suggesting right atrial pressure of 3 mmHg. FINDINGS  Left Ventricle: Left ventricular ejection fraction, by estimation, is 60 to 65%. The left ventricle has normal function. The left ventricle has no regional wall motion abnormalities. The left ventricular internal cavity size was normal in size. There is  mild left ventricular hypertrophy. Left ventricular diastolic parameters are consistent with Grade I diastolic dysfunction (impaired relaxation). Right Ventricle: The right ventricular size is normal. No increase in right ventricular wall thickness. Right ventricular systolic function is normal. There is mildly elevated pulmonary artery systolic pressure. The tricuspid regurgitant velocity is 2.82  m/s, and with an assumed right atrial pressure of 5 mmHg, the estimated right ventricular systolic pressure is 36.8 mmHg. Left Atrium: Left atrial size was normal in size. Right Atrium: Right atrial size was normal in size. Pericardium: There is no evidence of pericardial effusion. Mitral Valve: The mitral valve is normal in structure. There is severe calcification of the mitral valve leaflet(s). Mild to moderate mitral valve regurgitation. Mild to moderate mitral valve stenosis. MV peak gradient, 14.7 mmHg. The mean mitral valve gradient is 9.0 mmHg. Tricuspid Valve: The tricuspid valve is normal in structure. Tricuspid valve regurgitation is moderate . No evidence of tricuspid stenosis. Aortic Valve: The aortic valve has been repaired/replaced. Aortic valve regurgitation is not visualized. No aortic stenosis is present.  Aortic valve mean gradient measures 13.7 mmHg. Aortic valve peak gradient measures 23.9 mmHg. Aortic valve area, by VTI measures 1.12 cm. There is a bioprosthetic valve present in the aortic position. Pulmonic Valve: The pulmonic valve was normal in structure. Pulmonic valve regurgitation is mild. No evidence of pulmonic stenosis. Aorta: The aortic root is normal in size and structure. Venous: The inferior vena cava is normal in size with greater than 50% respiratory variability, suggesting right atrial pressure of 3 mmHg. IAS/Shunts: No atrial level shunt detected by color flow Doppler.  LEFT VENTRICLE PLAX 2D LVIDd:         3.70 cm   Diastology LVIDs:         2.30 cm   LV e' medial:    6.42 cm/s LV PW:         1.10 cm   LV E/e' medial:  22.9 LV IVS:        1.30 cm  LV e' lateral:   6.64 cm/s LVOT diam:     1.90 cm   LV E/e' lateral: 22.1 LV SV:         52 LV SV Index:   40 LVOT Area:     2.84 cm  RIGHT VENTRICLE RV Basal diam:  3.10 cm RV Mid diam:    2.40 cm RV S prime:     14.00 cm/s TAPSE (M-mode): 2.0 cm LEFT ATRIUM             Index        RIGHT ATRIUM           Index LA diam:        3.90 cm 2.99 cm/m   RA Area:     10.70 cm LA Vol (A2C):   34.9 ml 26.79 ml/m  RA Volume:   21.40 ml  16.43 ml/m LA Vol (A4C):   16.8 ml 12.90 ml/m LA Biplane Vol: 25.9 ml 19.88 ml/m  AORTIC VALVE AV Area (Vmax):    0.98 cm AV Area (Vmean):   1.05 cm AV Area (VTI):     1.12 cm AV Vmax:           244.67 cm/s AV Vmean:          170.667 cm/s AV VTI:            0.466 m AV Peak Grad:      23.9 mmHg AV Mean Grad:      13.7 mmHg LVOT Vmax:         84.60 cm/s LVOT Vmean:        63.000 cm/s LVOT VTI:          0.184 m LVOT/AV VTI ratio: 0.40 MITRAL VALVE                TRICUSPID VALVE MV Area (PHT): 2.31 cm     TR Peak grad:   31.8 mmHg MV Area VTI:   1.07 cm     TR Vmax:        282.00 cm/s MV Peak grad:  14.7 mmHg MV Mean grad:  9.0 mmHg     SHUNTS MV Vmax:       1.92 m/s     Systemic VTI:  0.18 m MV Vmean:      141.0 cm/s    Systemic Diam: 1.90 cm MV Decel Time: 328 msec MV E velocity: 147.00 cm/s MV A velocity: 183.00 cm/s MV E/A ratio:  0.80 Julien Nordmann MD Electronically signed by Julien Nordmann MD Signature Date/Time: 05/30/2023/1:33:03 PM    Final    DG Chest Port 1 View  Result Date: 05/30/2023 CLINICAL DATA:  Hypoxia. EXAM: PORTABLE CHEST 1 VIEW COMPARISON:  05/28/2023. FINDINGS: There are diffuse increased interstitial markings throughout bilateral lungs, more pronounced than the prior exam, suggesting superimposed congestive heart failure/pulmonary edema. No dense consolidation or lung collapse. There are probable small layering pleural effusions. No pneumothorax. Stable cardio-mediastinal silhouette. Sternotomy wires noted.  There is prosthetic aortic valve. No acute osseous abnormalities. The soft tissues are within normal limits. Right-sided PICC line noted with its tip overlying the midportion of superior vena cava IMPRESSION: *Increased interstitial markings and probable layering bilateral pleural effusions, favor congestive heart failure/pulmonary edema, slightly worsened since the prior study. Electronically Signed   By: Jules Schick M.D.   On: 05/30/2023 08:20   DG Chest Port 1 View  Result Date: 05/28/2023 CLINICAL DATA:  Sepsis PICC placement EXAM: PORTABLE CHEST 1 VIEW COMPARISON:  05/27/2023, 12/25/2021 FINDINGS: Post sternotomy changes and valve prosthesis. Right upper extremity central venous catheter tip over the SVC. Stable cardiomediastinal silhouette with aortic atherosclerosis.underlying chronic interstitial opacities but with superimposed heterogeneous interstitial and ground-glass opacity. No sizeable effusions. IMPRESSION: 1. Right upper extremity central venous catheter tip over the SVC. 2. Underlying chronic interstitial opacities with superimposed heterogeneous interstitial and ground-glass opacity which may be due to edema or infection. Electronically Signed   By: Jasmine Pang M.D.   On:  05/28/2023 19:32   CT ANGIO HEAD NECK W WO CM  Result Date: 05/28/2023 CLINICAL DATA:  Neuro deficit, acute, stroke suspected anisocoria EXAM: CT ANGIOGRAPHY HEAD AND NECK WITH AND WITHOUT CONTRAST TECHNIQUE: Multidetector CT imaging of the head and neck was performed using the standard protocol during bolus administration of intravenous contrast. Multiplanar CT image reconstructions and MIPs were obtained to evaluate the vascular anatomy. Carotid stenosis measurements (when applicable) are obtained utilizing NASCET criteria, using the distal internal carotid diameter as the denominator. RADIATION DOSE REDUCTION: This exam was performed according to the departmental dose-optimization program which includes automated exposure control, adjustment of the mA and/or kV according to patient size and/or use of iterative reconstruction technique. CONTRAST:  75mL OMNIPAQUE IOHEXOL 350 MG/ML SOLN COMPARISON:  MRI head May 19, 2019. MRA head September 29, 2006. FINDINGS: CT HEAD FINDINGS Brain: Multiple remote cerebellar infarcts bilaterally. Patchy white matter hypodensities are nonspecific but compatible with chronic microvascular ischemic disease. No evidence of acute large vascular territory infarct, acute hemorrhage, mass lesion or midline shift. Vascular: See below. Skull: No acute fracture. Sinuses/Orbits: No acute abnormality. Other: No mastoid effusions. Review of the MIP images confirms the above findings CTA NECK FINDINGS Aortic arch: Extensive atherosclerosis of the aorta. Great vessel origins are patent new severe stenosis of the brachiocephalic artery. Moderate stenosis of the left subclavian artery origin. Right carotid system: Atherosclerosis at the carotid bifurcation without greater than 50% stenosis. Left carotid system: Mild-to-moderate atherosclerotic narrowing of the common carotid artery origin. Atherosclerosis at the carotid bifurcation without greater than 50% stenosis. Vertebral arteries: Left  dominant. Both vertebral arteries are patent without significant (greater than 50%) stenosis. Skeleton: Multilevel degenerative change. No acute abnormality on limited assessment. Other neck: No acute abnormality on limited assessment. Upper chest: Extensive opacities within the visualized lung apices bilaterally. Review of the MIP images confirms the above findings CTA HEAD FINDINGS Anterior circulation: Bilateral intracranial ICAs are patent with mild to moderate for age atherosclerosis and narrowing. Bilateral MCAs are patent without proximal hemodynamically significant stenosis. Posterior circulation: Bilateral intradural vertebral arteries are patent. The right vertebral artery is small/non dominant and largely terminates as PICA with small contribution to the basilar artery, anatomic variant. Basilar artery and bilateral posterior cerebral arteries are patent without proximal hemodynamically significant stenosis. Venous sinuses: As permitted by contrast timing, patent. Anatomic variants: Detailed above. Review of the MIP images confirms the above findings IMPRESSION: 1. No emergent large vessel occlusion. 2. Extensive aortic atherosclerosis (ICD10-I70.0) with severe stenosis of the brachiocephalic artery and moderate stenosis of the left subclavian artery origin. 3. Extensive opacities within the visualized lung apices bilaterally, concerning for pneumonia. Electronically Signed   By: Feliberto Harts M.D.   On: 05/28/2023 15:18   DG CHEST PORT 1 VIEW  Result Date: 05/27/2023 CLINICAL DATA:  78 year old female with history of respiratory failure with hypoxia. EXAM: PORTABLE CHEST 1 VIEW COMPARISON:  Chest x-ray 05/25/2023. FINDINGS: There is a right upper extremity PICC with tip terminating in the proximal superior vena  cava. Lung volumes are low. Widespread interstitial prominence, diffuse peribronchial cuffing and patchy ill-defined opacities are noted throughout the lungs bilaterally. Probable small  bilateral pleural effusions. No pneumothorax. Pulmonary vasculature does not appear engorged. Heart size appears normal. Upper mediastinal contours are within normal limits allowing for patient rotation to the right. Atherosclerotic calcifications are noted in the thoracic aorta. Status post median sternotomy for aortic valve replacement (a stented bioprosthesis is noted). IMPRESSION: 1. Worsening aeration in the lungs, favored to reflect evolving severe multilobar bilateral bronchopneumonia, as above. 2. Aortic atherosclerosis. 3. Postoperative changes and support apparatus, as above. Electronically Signed   By: Trudie Reed M.D.   On: 05/27/2023 06:34   [place] EKG SITE RITE  Result Date: 05/26/2023 If Site Rite image not attached, placement could not be confirmed due to current cardiac rhythm.  CT Angio Chest Pulmonary Embolism (PE) W or WO Contrast  Result Date: 05/26/2023 CLINICAL DATA:  Pulmonary embolism (PE) suspected, low to intermediate prob, positive D-dimer significant hypoxia, elevated D-dimer EXAM: CT ANGIOGRAPHY CHEST WITH CONTRAST TECHNIQUE: Multidetector CT imaging of the chest was performed using the standard protocol during bolus administration of intravenous contrast. Multiplanar CT image reconstructions and MIPs were obtained to evaluate the vascular anatomy. RADIATION DOSE REDUCTION: This exam was performed according to the departmental dose-optimization program which includes automated exposure control, adjustment of the mA and/or kV according to patient size and/or use of iterative reconstruction technique. CONTRAST:  48mL OMNIPAQUE IOHEXOL 350 MG/ML SOLN COMPARISON:  02/18/2022 FINDINGS: Cardiovascular: No filling defects in the pulmonary arteries to suggest pulmonary emboli. Cardiomegaly. Prior valve replacement. Heart mildly enlarged. Aorta normal caliber. Diffuse aortic atherosclerosis. Mediastinum/Nodes: Mediastinal adenopathy. Right paratracheal lymph node has a short axis  diameter of 1.4 cm. Prevascular lymph nodes have a short axis diameter of 9 mm. AP window lymph node has a short axis diameter of 1.4 cm. No hilar or axillary adenopathy. Trachea and esophagus are unremarkable. Thyroid unremarkable. Lungs/Pleura: Severe diffuse bilateral airspace disease. Small bilateral pleural effusions. Upper Abdomen: Nodular contours of the liver compatible with cirrhosis. Perihepatic and perisplenic ascites. Spleen appears enlarged. Musculoskeletal: Chest wall soft tissues are unremarkable. No acute bony abnormality. Review of the MIP images confirms the above findings. IMPRESSION: No evidence of pulmonary embolus. Severe diffuse bilateral airspace disease with small effusions. Findings could reflect edema or infection. Mediastinal adenopathy. This has worsened since prior study. This could be reactive, but recommend follow-up CT in 6 months to assess stability. Aortic Atherosclerosis (ICD10-I70.0). Electronically Signed   By: Charlett Nose M.D.   On: 05/26/2023 17:05   DG Chest Port 1 View  Result Date: 05/25/2023 CLINICAL DATA:  78 year old female with shortness of breath. EXAM: PORTABLE CHEST 1 VIEW COMPARISON:  Portable chest 05/20/2023 and earlier. FINDINGS: Portable AP semi upright view at 0725 hours. Ongoing coarse diffuse pulmonary interstitial appearing opacity, new since 05/19/2023. Mildly improved lung volumes. Overall ventilation not significantly changed. Left lung apex is spared. Stable cardiac size and mediastinal contours. Underlying sternotomy, cardiac valve replacement, Calcified aortic atherosclerosis. No pneumothorax. No pleural effusion. Paucity of bowel gas. Stable cholecystectomy clips. IMPRESSION: Unresolved course and widespread pulmonary interstitial opacity, new since 05/19/2023. Top differential considerations include pulmonary edema and bilateral viral/atypical pneumonia. No pleural effusion is evident. No new cardiopulmonary abnormality. Electronically Signed    By: Odessa Fleming M.D.   On: 05/25/2023 08:09   US Abdomen Limited RUQ (LIVER/GB)  Result Date: 05/21/2023 CLINICAL DATA:  Transaminitis. EXAM: ULTRASOUND ABDOMEN LIMITED RIGHT UPPER QUADRANT COMPARISON:  December 25, 2021. FINDINGS: Gallbladder: Status post cholecystectomy. Common bile duct: Diameter: 3 mm which is within normal limits. Liver: No focal lesion identified. Slightly nodular hepatic contours are noted suggesting hepatic cirrhosis. Portal vein is patent on color Doppler imaging with normal direction of blood flow towards the liver. Other: Minimal ascites is noted around the liver. IMPRESSION: Status post cholecystectomy. Probable hepatic cirrhosis with minimal surrounding ascites. No definite focal hepatic parenchymal abnormality is noted. Electronically Signed   By: Lupita Raider M.D.   On: 05/21/2023 15:37   US RENAL  Result Date: 05/20/2023 CLINICAL DATA:  Acute renal failure. EXAM: RENAL / URINARY TRACT ULTRASOUND COMPLETE COMPARISON:  CT abdomen pelvis dated December 25, 2021. FINDINGS: Right Kidney: Renal measurements: 10.4 x 4.1 x 4.9 cm = volume: 110 mL. Increased echogenicity. No mass or hydronephrosis visualized. Left Kidney: Renal measurements: 10.4 x 4.3 x 5.3 cm = volume: 123 mL. Increased echogenicity. No mass or hydronephrosis visualized. Bladder: Appears normal for degree of bladder distention. Other: Small ascites in the right upper quadrant. IMPRESSION: 1. Increased bilateral renal echogenicity as can be seen with medical renal disease. No hydronephrosis. 2. Small ascites in the right upper quadrant. Electronically Signed   By: Obie Dredge M.D.   On: 05/20/2023 15:28   ECHOCARDIOGRAM COMPLETE  Result Date: 05/20/2023    ECHOCARDIOGRAM REPORT   Patient Name:   COTINA SJOSTROM Date of Exam: 05/20/2023 Medical Rec #:  [id number]        Height:       56.0 in Accession #:    [id number]       Weight:       100.3 lb Date of Birth:  05/16/1946       BSA:          1.324 m Patient Age:     76 years         BP:           101/40 mmHg Patient Gender: F                HR:           103 bpm. Exam Location:  [facility] Procedure: 2D Echo, Cardiac Doppler and Color Doppler Indications:     Elevated Troponin  History:         Patient has prior history of Echocardiogram examinations, most                  recent 04/08/2017. CHF, Stroke, Aortic Valve Disease; Risk                  Factors:Hypertension, Diabetes and Former Smoker. There is a                  Bovine prosthetic valve in the Aortic position which was placed                  on 02/17/2017.                  Aortic Valve: 19 mm Magna bioprosthetic valve is present in the                  aortic position.  Sonographer:     [place] Referring Phys:  [id number] [place] NELSON Diagnosing Phys: Yvonne Kendall MD IMPRESSIONS  1. Left ventricular ejection fraction, by estimation, is 55 to 60%. The left ventricle has normal function. The left ventricle has no regional wall motion abnormalities. There is  mild left ventricular hypertrophy. Left ventricular diastolic function could not be evaluated.  2. Right ventricular systolic function is normal. The right ventricular size is normal. There is normal pulmonary artery systolic pressure.  3. Left atrial size was moderately dilated.  4. The mitral valve is abnormal. Moderate mitral valve regurgitation. Moderate mitral stenosis. The mean mitral valve gradient is 9.0 mmHg.  5. Tricuspid valve regurgitation is moderate.  6. The aortic valve was not well visualized. There is moderate calcification of the aortic valve. There is severe thickening of the aortic valve. Aortic valve regurgitation is not visualized. Mild aortic valve stenosis. There is a 19 mm Magna bioprosthetic valve present in the aortic position. Aortic valve area, by VTI measures 1.96 cm. Aortic valve mean gradient measures 15.3 mmHg.  7. Pulmonic valve regurgitation is moderate.  8. The inferior vena cava is normal in size with greater than 50%  respiratory variability, suggesting right atrial pressure of 3 mmHg. FINDINGS  Left Ventricle: Left ventricular ejection fraction, by estimation, is 55 to 60%. The left ventricle has normal function. The left ventricle has no regional wall motion abnormalities. The left ventricular internal cavity size was normal in size. There is  mild left ventricular hypertrophy. Left ventricular diastolic function could not be evaluated due to mitral stenosis. Left ventricular diastolic function could not be evaluated. Right Ventricle: The right ventricular size is normal. No increase in right ventricular wall thickness. Right ventricular systolic function is normal. There is normal pulmonary artery systolic pressure. The tricuspid regurgitant velocity is 1.87 m/s, and  with an assumed right atrial pressure of 3 mmHg, the estimated right ventricular systolic pressure is 17.0 mmHg. Left Atrium: Left atrial size was moderately dilated. Right Atrium: Right atrial size was normal in size. Pericardium: There is no evidence of pericardial effusion. Mitral Valve: The mitral valve is abnormal. There is severe thickening of the mitral valve leaflet(s). There is mild calcification of the mitral valve leaflet(s). Moderate mitral valve regurgitation. Moderate mitral valve stenosis. MV peak gradient, 57.2  mmHg. The mean mitral valve gradient is 9.0 mmHg. Tricuspid Valve: The tricuspid valve is not well visualized. Tricuspid valve regurgitation is moderate. Aortic Valve: The aortic valve was not well visualized. There is moderate calcification of the aortic valve. There is severe thickening of the aortic valve. Aortic valve regurgitation is not visualized. Mild aortic stenosis is present. Aortic valve mean gradient measures 15.3 mmHg. Aortic valve peak gradient measures 27.5 mmHg. Aortic valve area, by VTI measures 1.96 cm. There is a 19 mm Magna bioprosthetic valve present in the aortic position. Pulmonic Valve: The pulmonic valve was  normal in structure. Pulmonic valve regurgitation is moderate. Aorta: The aortic root and ascending aorta are structurally normal, with no evidence of dilitation. Pulmonary Artery: The pulmonary artery is of normal size. Venous: The inferior vena cava is normal in size with greater than 50% respiratory variability, suggesting right atrial pressure of 3 mmHg. IAS/Shunts: No atrial level shunt detected by color flow Doppler.  LEFT VENTRICLE PLAX 2D LVIDd:         3.80 cm     Diastology LVIDs:         2.60 cm     LV e' medial:    9.36 cm/s LV PW:         1.20 cm     LV E/e' medial:  20.9 LV IVS:        1.20 cm     LV e' lateral:   7.72 cm/s  LVOT diam:     2.00 cm     LV E/e' lateral: 25.4 LV SV:         100 LV SV Index:   75 LVOT Area:     3.14 cm  LV Volumes (MOD) LV vol d, MOD A2C: 57.3 ml LV vol d, MOD A4C: 59.7 ml LV vol s, MOD A2C: 30.1 ml LV vol s, MOD A4C: 28.6 ml LV SV MOD A2C:     27.2 ml LV SV MOD A4C:     59.7 ml LV SV MOD BP:      29.3 ml RIGHT VENTRICLE RV Basal diam:  3.40 cm RV Mid diam:    3.10 cm RV S prime:     18.00 cm/s TAPSE (M-mode): 1.7 cm LEFT ATRIUM             Index        RIGHT ATRIUM           Index LA diam:        3.80 cm 2.87 cm/m   RA Area:     14.40 cm LA Vol (A2C):   73.6 ml 55.59 ml/m  RA Volume:   35.00 ml  26.44 ml/m LA Vol (A4C):   49.6 ml 37.46 ml/m LA Biplane Vol: 61.7 ml 46.60 ml/m  AORTIC VALVE                     PULMONIC VALVE AV Area (Vmax):    2.04 cm      PV Vmax:       1.24 m/s AV Area (Vmean):   1.96 cm      PV Peak grad:  6.2 mmHg AV Area (VTI):     1.96 cm AV Vmax:           262.00 cm/s AV Vmean:          177.667 cm/s AV VTI:            0.509 m AV Peak Grad:      27.5 mmHg AV Mean Grad:      15.3 mmHg LVOT Vmax:         170.00 cm/s LVOT Vmean:        111.000 cm/s LVOT VTI:          0.318 m LVOT/AV VTI ratio: 0.63  AORTA Ao Root diam: 3.50 cm Ao Asc diam:  2.80 cm MITRAL VALVE                  TRICUSPID VALVE MV Area (PHT): 3.20 cm       TR Peak grad:   14.0  mmHg MV Area VTI:   1.09 cm       TR Vmax:        187.00 cm/s MV Peak grad:  57.2 mmHg MV Mean grad:  9.0 mmHg       SHUNTS MV Vmax:       3.78 m/s       Systemic VTI:  0.32 m MV Vmean:      274.5 cm/s     Systemic Diam: 2.00 cm MV Decel Time: 237 msec MR Peak grad:    114.5 mmHg MR Mean grad:    78.0 mmHg MR Vmax:         535.00 cm/s MR Vmean:        416.0 cm/s MR PISA:         1.01 cm MR PISA Eff ROA: 17 mm MR  PISA Radius:  0.40 cm MV E velocity: 196.00 cm/s MV A velocity: 158.00 cm/s MV E/A ratio:  1.24 Yvonne Kendall MD Electronically signed by Yvonne Kendall MD Signature Date/Time: 05/20/2023/2:19:22 PM    Final    DG Chest Port 1 View  Result Date: 05/20/2023 CLINICAL DATA:  Pneumonia EXAM: PORTABLE CHEST 1 VIEW COMPARISON:  Yesterday FINDINGS: Extensive interstitial and airspace density. Cardiomegaly. Aortic valve replacement. A small left pleural effusion is likely. Clear apical lungs. IMPRESSION: 1. Worsening aeration, generalized appearance and rapid progression favoring edema. There is asymmetric left-sided airspace disease and pneumonia may be coexistent. 2. Background of chronic lung disease and mild fibrosis by prior chest CT imaging Electronically Signed   By: Tiburcio Pea M.D.   On: 05/20/2023 05:11   DG Abd 1 View  Result Date: 05/19/2023 CLINICAL DATA:  Vomiting EXAM: ABDOMEN - 1 VIEW COMPARISON:  12/25/2021 CT FINDINGS: Prior cholecystectomy. Nonobstructive bowel gas pattern. No organomegaly or free air. Diffuse aortic calcifications. No visible aneurysm. IMPRESSION: No acute findings. Electronically Signed   By: Charlett Nose M.D.   On: 05/19/2023 21:25   DG Chest Port 1 View  Result Date: 05/19/2023 CLINICAL DATA:  Questionable sepsis. EXAM: PORTABLE CHEST 1 VIEW COMPARISON:  Chest radiograph dated 12/25/2021 and CT dated 02/18/2022. FINDINGS: There is background of chronic interstitial coarsening. An area of increased opacity in the left mid lung field most concerning for  developing infiltrate. Additional smaller density in the right lower lung field also concerning for infiltrate. No pleural effusion or pneumothorax. The cardiac silhouette is within normal limits. Median sternotomy wires and mechanical cardiac valve. Atherosclerotic calcification of the aorta. No acute osseous pathology. IMPRESSION: Bilateral pulmonary infiltrates, left greater than right. Electronically Signed   By: Elgie Collard M.D.   On: 05/19/2023 18:42    Microbiology: Recent Results (from the past 240 hour(s))  Gastrointestinal Panel by PCR , Stool     Status: None   Collection Time: 05/23/23  5:15 PM   Specimen: Stool  Result Value Ref Range Status   Campylobacter species NOT DETECTED NOT DETECTED Final   Plesimonas shigelloides NOT DETECTED NOT DETECTED Final   Salmonella species NOT DETECTED NOT DETECTED Final   Yersinia enterocolitica NOT DETECTED NOT DETECTED Final   Vibrio species NOT DETECTED NOT DETECTED Final   Vibrio cholerae NOT DETECTED NOT DETECTED Final   Enteroaggregative E coli (EAEC) NOT DETECTED NOT DETECTED Final   Enteropathogenic E coli (EPEC) NOT DETECTED NOT DETECTED Final   Enterotoxigenic E coli (ETEC) NOT DETECTED NOT DETECTED Final   Shiga like toxin producing E coli (STEC) NOT DETECTED NOT DETECTED Final   Shigella/Enteroinvasive E coli (EIEC) NOT DETECTED NOT DETECTED Final   Cryptosporidium NOT DETECTED NOT DETECTED Final   Cyclospora cayetanensis NOT DETECTED NOT DETECTED Final   Entamoeba histolytica NOT DETECTED NOT DETECTED Final   Giardia lamblia NOT DETECTED NOT DETECTED Final   Adenovirus F40/41 NOT DETECTED NOT DETECTED Final   Astrovirus NOT DETECTED NOT DETECTED Final   Norovirus GI/GII NOT DETECTED NOT DETECTED Final   Rotavirus A NOT DETECTED NOT DETECTED Final   Sapovirus (I, II, IV, and V) NOT DETECTED NOT DETECTED Final    Comment: Performed at [facility], [address] [place], [place] [address]  SARS Coronavirus 2 by  RT PCR (hospital order, performed in [facility] hospital lab) *cepheid single result test* Anterior Nasal Swab     Status: None   Collection Time: 05/26/23  9:00 AM  Specimen: Anterior Nasal Swab  Result Value Ref Range Status   SARS Coronavirus 2 by RT PCR NEGATIVE NEGATIVE Final    Comment: (NOTE) SARS-CoV-2 target nucleic acids are NOT DETECTED.  The SARS-CoV-2 RNA is generally detectable in upper and lower respiratory specimens during the acute phase of infection. The lowest concentration of SARS-CoV-2 viral copies this assay can detect is 250 copies / mL. A negative result does not preclude SARS-CoV-2 infection and should not be used as the sole basis for treatment or other patient management decisions.  A negative result may occur with improper specimen collection / handling, submission of specimen other than nasopharyngeal swab, presence of viral mutation(s) within the areas targeted by this assay, and inadequate number of viral copies (<250 copies / mL). A negative result must be combined with clinical observations, patient history, and epidemiological information.  Fact Sheet for Patients:   RoadLapTop.co.za  Fact Sheet for Healthcare Providers: http://kim-miller.com/  This test is not yet approved or  cleared by the [place] FDA and has been authorized for detection and/or diagnosis of SARS-CoV-2 by FDA under an Emergency Use Authorization (EUA).  This EUA will remain in effect (meaning this test can be used) for the duration of the COVID-19 declaration under Section 564(b)(1) of the Act, 21 U.S.C. section 360bbb-3(b)(1), unless the authorization is terminated or revoked sooner.  Performed at [facility], [address] [place], [place] [address]   Respiratory (~20 pathogens) panel by PCR     Status: None   Collection Time: 05/27/23  9:43 AM   Specimen: Nasopharyngeal Swab; Respiratory  Result Value Ref  Range Status   Adenovirus NOT DETECTED NOT DETECTED Final   Coronavirus 229E NOT DETECTED NOT DETECTED Final    Comment: (NOTE) The Coronavirus on the Respiratory Panel, DOES NOT test for the novel  Coronavirus (2019 nCoV)    Coronavirus HKU1 NOT DETECTED NOT DETECTED Final   Coronavirus NL63 NOT DETECTED NOT DETECTED Final   Coronavirus OC43 NOT DETECTED NOT DETECTED Final   Metapneumovirus NOT DETECTED NOT DETECTED Final   Rhinovirus / Enterovirus NOT DETECTED NOT DETECTED Final   Influenza A NOT DETECTED NOT DETECTED Final   Influenza B NOT DETECTED NOT DETECTED Final   Parainfluenza Virus 1 NOT DETECTED NOT DETECTED Final   Parainfluenza Virus 2 NOT DETECTED NOT DETECTED Final   Parainfluenza Virus 3 NOT DETECTED NOT DETECTED Final   Parainfluenza Virus 4 NOT DETECTED NOT DETECTED Final   Respiratory Syncytial Virus NOT DETECTED NOT DETECTED Final   Bordetella pertussis NOT DETECTED NOT DETECTED Final   Bordetella Parapertussis NOT DETECTED NOT DETECTED Final   Chlamydophila pneumoniae NOT DETECTED NOT DETECTED Final   Mycoplasma pneumoniae NOT DETECTED NOT DETECTED Final    Comment: Performed at [facility], [address] [place], [place] [address]    Time spent: 30 minutes  Signed: Sunnie Nielsen, DO 06/15/2023

## 2023-07-02 NOTE — Progress Notes (Signed)
Patient extremely restless/agitated, currently on Precedex at maximum ordered dose. Patient pulling at her bed pad and trying to remove her protective sacral foam. Dahlia Byes, NP at patients bedside, witnessing her restlessness and discomfort, NP asked the patient if she was having any pain? Patient replied, "Yes!" Patient repositioned with no improvement noted; New order entered for Morphine 2mg  once now by NP.

## 2023-07-02 NOTE — Progress Notes (Signed)
NAME:  Jenny [name], MRN:  [id number], DOB:  1945/09/17, LOS: 13 ADMISSION DATE:  05/19/2023, CONSULTATION DATE: 05/19/2023 REFERRING MD: Dr. Roxan Hockey, CHIEF COMPLAINT: Emesis   History of Present Illness:  This is a 78 yo female who presented to [facility] ER on 11/18 via EMS with c/o "not feeling well", nausea, and vomiting.  EMS reported when they arrived at pts home she was lethargic and diaphoretic with generalized weakness.  She received iv fluids en route to the ER.  Pts husband reports the pt has had a cough over the past 2 weeks, however her cough worsened today and she vomited bile prompting EMS notification.  ED Course  Upon arrival to the ER pt hypotensive and tachycardic meeting sepsis criteria.  She received 2L NS bolus/azithromycin/ceftriaxone, however she remained hypotensive requiring levophed gtt.  CXR concerning for pneumonia.  Significant lab results were: K+ 3.1/CO2 18/glucose 177/creatinine 1.08/calcium 8.3/albumin 2.6/AST 168/ALT 56/ troponin 55/lactic acid 6.3/wbc 11.9/hgb 11.1/platelets 65.  PCCM team contacted for ICU admission.     Pertinent  Medical History  Anxiety  Arthritis  CAD  Chronic Systolic CHF Depression  Type II Diabetes Mellitus  HLD Hypothyroidism  IBS Iron Deficiency Anemia  Osteoporosis  Panic Attacks  Severe Aortic Stenosis  Stroke  TIA   Significant Hospital Events: Including procedures, antibiotic start and stop dates in addition to other pertinent events   11/18: Pt admitted with septic shock secondary to pneumonia requiring levophed gtt  11/21: Pt no longer requiring vasopressors transferred to [facility]  11/24: Pt transferred to the stepdown unit with worsening acute hypoxic respiratory failure  11/27: Pt on and off CPAP or HHFNC @100 % 11/30: Pulse dose steroids initiated by PCCM x3 days  12/02: Pt with worsening acute hypoxic respiratory failure despite CPAP @100 % unable to tolerate Bipap received; aggressive diureses; pulse steroids;  and  2 mg of iv morphine x2 doses.  Pt attempting to remove CPAP mask multiple times and agitated but awake.  Transitioned to [facility] @100 % and NRB.  Precedex gtt increased to 1.4 mcg/kg/hr for agitation.  Pt appears to be suffering.  Recommend transitioning to CMO pts husband notified via telephone and stated he will come to the hospital to discuss plan of care   Interim History / Subjective:  As outlined above under significant events   Objective   Blood pressure (!) 118/56, pulse 73, temperature (!) 96.4 F (35.8 C), temperature source Axillary, resp. rate (!) 22, height 4\' 8"  (1.422 m), weight 43.3 kg, SpO2 (!) 79%.    FiO2 (%):  [100 %] 100 %   Intake/Output Summary (Last 24 hours) at 06/11/2023 0744 Last data filed at 06/18/2023 7253 Gross per 24 hour  Intake 464.71 ml  Output 1400 ml  Net -935.29 ml   Filed Weights   05/29/23 0500 05/30/23 0600 06/01/23 0500  Weight: 44.3 kg 43.8 kg 43.3 kg    Examination: General: Acute on chronically-ill appearing cachetic female, in severe acute hypoxic respiratory distress on 100% CPAP  HENT: Supple, mild JVD  Lungs: Diminished throughout, labored with accessory muscle use  Cardiovascular: NSR, s1s2, no m/r/g, 2+ radial/1+ distal pulses, no edema  Abdomen: +BS x4, non tender, non distended  Extremities: Moves all extremities  Skin: Generalized abrasions present on admission Neuro: Awake but agitated, following commands, PERRLA  GU: Indwelling foley catheter draining clear yellow urine   Resolved Hospital Problem list   Septic shock  Transaminitis  Metabolic acidosis   Assessment & Plan:   #Acute hypoxic respiratory  failure  #Multifocal pneumonia #Elevated troponin suspect secondary to demand ischemia  #Acute on chronic systolic CHF  #Mild acute kidney injury secondary ATN #Hypokalemia  #Sepsis  #Pneumonia  #Iron deficiency anemia  #Acute on chronic thrombocytopenia  #Type II diabetes mellitus  - Pt transitioning to Comfort  Measures Only per husband's request following goals of care discussions with Dr. Belia Heman - Prn robinul for excessive secretions - Morphine gtt with boluses for pain and air hunger  - Prn versed for anxiety and/or agitation   PCCM team will sign off  Labs   CBC: Recent Labs  Lab 05/29/23 0524 05/30/23 0433 05/31/23 0601 06/01/23 0407 06/23/2023 0359  WBC 15.5* 8.7 7.9 3.8* 3.5*  HGB 8.6* 8.1* 7.7* 7.3* 7.7*  HCT 25.9* 24.4* 23.5* 21.9* 23.7*  MCV 89.0 89.7 89.7 88.7 88.4  PLT 128* 101* 86* 101* 84*    Basic Metabolic Panel: Recent Labs  Lab 05/29/23 0524 05/29/23 1747 05/30/23 0433 05/31/23 0601 06/01/23 0407 06/17/2023 0359  NA 134* 133* 134* 137 143 148*  K 3.3* 3.5 3.1* 3.6 3.2* 2.7*  CL 101 101 102 105 111 116*  CO2 22 22 24 22 23 25   GLUCOSE 178* 156* 153* 177* 178* 94  BUN 40* 37* 37* 41* 45* 51*  CREATININE 1.01* 1.02* 1.02* 1.19* 1.14* 0.99  CALCIUM 7.3* 7.4* 7.3* 7.6* 7.8* 7.8*  MG 1.6*  --  2.0 1.9 1.9 2.0  PHOS 2.9  --  3.4 3.8 4.2 4.2   GFR: Estimated Creatinine Clearance: 28.6 mL/min (by C-G formula based on SCr of 0.99 mg/dL). Recent Labs  Lab 05/26/23 0955 05/26/23 1909 05/27/23 0339 05/28/23 0451 05/28/23 1319 05/28/23 1719 05/29/23 0524 05/29/23 1747 05/29/23 2155 05/30/23 0433 05/31/23 0601 06/01/23 0407 06/14/2023 0359  PROCALCITON 6.44  --  6.26 3.87  --   --  2.86  --   --   --   --   --   --   WBC  --    < > 5.5 10.1  --   --  15.5*  --   --  8.7 7.9 3.8* 3.5*  LATICACIDVEN  --    < >  --   --  3.5* 2.6*  --  2.3* 1.5  --   --   --   --    < > = values in this interval not displayed.    Liver Function Tests: Recent Labs  Lab 05/30/23 0433  AST 39  ALT 33  ALKPHOS 134*  BILITOT 1.0  PROT 5.3*  ALBUMIN 1.9*   No results for input(s): "LIPASE", "AMYLASE" in the last 168 hours. No results for input(s): "AMMONIA" in the last 168 hours.  ABG    Component Value Date/Time   PHART 7.4 05/27/2023 0715   PCO2ART 32 05/27/2023 0715    PO2ART 71 (L) 05/27/2023 0715   HCO3 19.8 (L) 05/27/2023 0715   TCO2 25 02/19/2017 1657   ACIDBASEDEF 4.0 (H) 05/27/2023 0715   O2SAT 95.3 05/27/2023 0715     Coagulation Profile: No results for input(s): "INR", "PROTIME" in the last 168 hours.   Cardiac Enzymes: No results for input(s): "CKTOTAL", "CKMB", "CKMBINDEX", "TROPONINI" in the last 168 hours.  HbA1C: Hemoglobin A1C  Date/Time Value Ref Range Status  02/19/2021 03:03 PM 5.6 4.0 - 5.6 % Final  10/19/2020 03:49 PM 6.2 (A) 4.0 - 5.6 % Final  10/23/2014 05:40 AM 8.6 (H) % Final    Comment:    4.0-6.0 NOTE: New Reference Range  09/06/14    HbA1c POC (<> result, manual entry)  Date/Time Value Ref Range Status  03/19/2022 02:57 PM 6.0 4.0 - 5.6 % Final   Hgb A1c MFr Bld  Date/Time Value Ref Range Status  05/19/2023 09:39 PM 5.3 4.8 - 5.6 % Final    Comment:    (NOTE) Pre diabetes:          5.7%-6.4%  Diabetes:              >6.4%  Glycemic control for   <7.0% adults with diabetes   05/23/2020 03:14 PM 5.6 4.8 - 5.6 % Final    Comment:             Prediabetes: 5.7 - 6.4          Diabetes: >6.4          Glycemic control for adults with diabetes: <7.0     CBG: Recent Labs  Lab 05/31/23 2243 06/01/23 0818 06/01/23 1124 06/01/23 1602 06/01/23 2214  GLUCAP 166* 176* 159* 150* 121*    Review of Systems: Positives in BOLD   Gen: poor po intake, fever, chills, weight change, fatigue, night sweats HEENT: Denies blurred vision, double vision, hearing loss, tinnitus, sinus congestion, rhinorrhea, sore throat, neck stiffness, dysphagia PULM: shortness of breath, cough, sputum production, hemoptysis, wheezing CV: Denies chest pain, edema, orthopnea, paroxysmal nocturnal dyspnea, palpitations GI: abdominal pain, nausea, vomiting, diarrhea, hematochezia, melena, constipation, change in bowel habits GU: Denies dysuria, hematuria, polyuria, oliguria, urethral discharge Endocrine: Denies hot or cold intolerance,  polyuria, polyphagia or appetite change Derm: Denies rash, dry skin, scaling or peeling skin change Heme: Denies easy bruising, bleeding, bleeding gums Neuro: Denies headache, numbness, weakness, slurred speech, loss of memory or consciousness  Past Medical History:  She,  has a past medical history of Anxiety, Arthritis, CAD (coronary artery disease), Depression, Diabetes mellitus with complication (HCC), Heart failure with improved ejection fraction (HFimpEF) (HCC), Hyperlipidemia, Hypothyroidism, IBS (irritable bowel syndrome), Iron deficiency anemia, Microcytic anemia, Osteoporosis, Panic attacks, Severe aortic stenosis, Stroke (HCC) (2001), and TIA (transient ischemic attack) (2006).   Surgical History:   Past Surgical History:  Procedure Laterality Date   ABDOMINAL HYSTERECTOMY     AORTIC VALVE REPLACEMENT N/A 02/17/2017   Procedure: AORTIC VALVE REPLACEMENT (AVR);  Surgeon: Donata Clay, Theron Arista, MD;  Location: [facility] OR;  Service: Open Heart Surgery;  Laterality: N/A;   CATARACT EXTRACTION W/PHACO Right 04/09/2016   Procedure: CATARACT EXTRACTION PHACO AND INTRAOCULAR LENS PLACEMENT (IOC);  Surgeon: Galen Manila, MD;  Location: [facility] ORS;  Service: Ophthalmology;  Laterality: Right;  [place] 57.2 AP% 18.6 CDE 10.64 Fluid Pack lot # [id number]   CATARACT EXTRACTION W/PHACO Left 12/29/2018   Procedure: CATARACT EXTRACTION PHACO AND INTRAOCULAR LENS PLACEMENT (IOC)  LEFT DIABETIC;  Surgeon: Galen Manila, MD;  Location: [facility];  Service: Ophthalmology;  Laterality: Left;  Diabetic - insuli and oral meds   CHOLECYSTECTOMY     COLONOSCOPY     CORONARY ANGIOGRAPHY N/A 02/03/2017   Procedure: CORONARY ANGIOGRAPHY;  Surgeon: Iran Ouch, MD;  Location: [facility] INVASIVE CV LAB;  Service: Cardiovascular;  Laterality: N/A;   FRACTURE SURGERY     LEFT ANKLE   RIGHT AND LEFT HEART CATH Bilateral 02/03/2017   Procedure: Right and Left Heart Cath;  Surgeon: Iran Ouch, MD;  Location:  [facility] INVASIVE CV LAB;  Service: Cardiovascular;  Laterality: Bilateral;   TEE WITHOUT CARDIOVERSION N/A 02/17/2017   Procedure: TRANSESOPHAGEAL ECHOCARDIOGRAM (TEE);  Surgeon: Donata Clay, Theron Arista,  MD;  Location: [facility] OR;  Service: Open Heart Surgery;  Laterality: N/A;   TIBIA IM NAIL INSERTION Left 05/19/2018   Procedure: INTRAMEDULLARY (IM) NAIL TIBIAL;  Surgeon: Juanell Fairly, MD;  Location: [facility] ORS;  Service: Orthopedics;  Laterality: Left;     Social History:   reports that she quit smoking about 23 years ago. Her smoking use included cigarettes. She started smoking about 53 years ago. She has never used smokeless tobacco. She reports that she does not drink alcohol and does not use drugs.   Family History:  Her family history includes Alzheimer's disease in her mother; Cancer in her maternal grandfather and maternal grandmother; Diabetes in her brother and father; Hypertension in her mother; Parkinson's disease in her mother. There is no history of Neuropathy.   Allergies Allergies  Allergen Reactions   Iodine Swelling    ANGIOEDEMA FACIAL SWELLING "BETADINE OKAY"   Shellfish Allergy Anaphylaxis     Home Medications  Prior to Admission medications   Medication Sig Start Date End Date Taking? Authorizing Provider  alendronate (FOSAMAX) 70 MG tablet Take 1 tablet (70 mg total) by mouth once a week. Take with a full glass of water on an empty stomach. 05/27/22   Corky Downs, MD  ALPRAZolam Prudy Feeler) 0.5 MG tablet Take 1 tablet (0.5 mg total) by mouth 2 (two) times daily. 07/03/22   Corky Downs, MD  Cholecalciferol (VITAMIN D3 PO) Take by mouth daily.    [provider]  Cranberry-Vitamin C (AZO CRANBERRY URINARY TRACT) 250-60 MG CAPS Take 1 each by mouth 2 (two) times daily. 04/30/22   Corky Downs, MD  donepezil (ARICEPT) 10 MG tablet Take 1 tablet (10 mg total) by mouth at bedtime. 05/19/23   Lomax, Amy, NP  glucose blood (ONETOUCH ULTRA) test strip TEST BLOOD SUGAR THREE  TIMES DAILY 11/02/21   Corky Downs, MD  hydrOXYzine (ATARAX/VISTARIL) 25 MG tablet Take 25 mg by mouth 2 (two) times daily.     [provider]  insulin glargine (LANTUS) 100 UNIT/ML injection Inject 0.25-0.35 mLs (25-35 Units total) into the skin See admin instructions. 35 units every morning and 25 units at bedtime 07/03/22   Corky Downs, MD  Insulin Syringe-Needle U-100 (INSULIN SYRINGE .5CC/31GX5/16") 31G X 5/16" 0.5 ML MISC USE 1 NEEDLE TWICE DAILY 02/18/22   Corky Downs, MD  Lancets (ONETOUCH DELICA PLUS LANCET30G) MISC USE TO TEST BLOOD SUGAR TWICE DAILY 06/27/22   Corky Downs, MD  levocetirizine (XYZAL) 5 MG tablet TAKE 1 TABLET(5 MG) BY MOUTH EVERY EVENING 08/14/21   Corky Downs, MD  levothyroxine (SYNTHROID) 88 MCG tablet TAKE 1 TABLET BY MOUTH DAILY 08/28/21   Corky Downs, MD  Loperamide HCl (IMODIUM A-D PO) Take by mouth as needed.    [provider]  Melatonin 10 MG TABS Take by mouth at bedtime.    [provider]  metFORMIN (GLUCOPHAGE) 500 MG tablet Take 1 tablet (500 mg total) by mouth 2 (two) times daily. 10/01/21   Corky Downs, MD  rosuvastatin (CRESTOR) 10 MG tablet TAKE 1 TABLET BY MOUTH DAILY 03/28/22   Corky Downs, MD  sertraline (ZOLOFT) 100 MG tablet TAKE 1 TABLET(100 MG) BY MOUTH TWICE DAILY 05/13/22   Kara Dies, NP  sitaGLIPtin (JANUVIA) 100 MG tablet TAKE 1 TABLET BY MOUTH EVERY MORNING 07/02/22   Corky Downs, MD     Critical care time: 50 minutes       Zada Girt, [facility]  Pulmonary/Critical Care Pager [phone] (please enter 7 digits) PCCM  Consult Pager [phone] (please enter 7 digits)

## 2023-07-02 NOTE — Progress Notes (Signed)
Patient restless and agitated this shift, moving and shifting in the bed quite a bit. While turning patient, her rectal was found out and balloon inflated. Patient stated that the rectal tube was out and she had a BM. Patient cleaned of stool, peri and foley care done. Rectal tube not replaced at this time due to decreased output.

## 2023-07-02 NOTE — IPAL (Signed)
  Interdisciplinary Goals of Care Family Meeting   Date carried out: 06/21/2023  Location of the meeting: Bedside  Member's involved: Physician, Bedside Registered Nurse, and Family Member or next of kin    GOALS OF CARE DISCUSSION  The Clinical status was relayed to family in detail- Husband  Updated and notified of patients medical condition- Patient with increased WOB and using accessory muscles to breathe Explained to family course of therapy and the modalities   Patient with Progressive multiorgan failure with a very high probablity of a very minimal chance of meaningful recovery despite all aggressive and optimal medical therapy.   Patient with severe end stage lung disease Suffering and suffocating  Family understands the situation.  Husband has consented and agreed to DNR/DNI and would like to proceed with Comfort care measures.  Family are satisfied with Plan of action and management. All questions answered  Additional CC time 35 mins   Keven Soucy Santiago Glad, M.D.  [facility]  Medical Director [facility] Medical Director [facility] Cardio-Pulmonary Department

## 2023-07-02 NOTE — Progress Notes (Signed)
Husband at bedside. Dr. Belia Heman aware. Orders for comfort care, will start morphine drip per order.

## 2023-07-02 DEATH — deceased
